# Patient Record
Sex: Male | Born: 1980 | Race: White | Hispanic: No | Smoking: Current every day smoker
Health system: Southern US, Community
[De-identification: ages and names within clinical notes are randomized; demographics above are authoritative.]

## PROBLEM LIST (undated history)

## (undated) DIAGNOSIS — B1920 Unspecified viral hepatitis C without hepatic coma: Secondary | ICD-10-CM

## (undated) DIAGNOSIS — F319 Bipolar disorder, unspecified: Secondary | ICD-10-CM

## (undated) DIAGNOSIS — F259 Schizoaffective disorder, unspecified: Secondary | ICD-10-CM

## (undated) DIAGNOSIS — F909 Attention-deficit hyperactivity disorder, unspecified type: Secondary | ICD-10-CM

---

## 2018-01-22 ENCOUNTER — Encounter (HOSPITAL_COMMUNITY): Payer: Self-pay | Admitting: *Deleted

## 2018-01-22 ENCOUNTER — Emergency Department (HOSPITAL_COMMUNITY): Payer: Self-pay

## 2018-01-22 DIAGNOSIS — Z79899 Other long term (current) drug therapy: Secondary | ICD-10-CM | POA: Insufficient documentation

## 2018-01-22 DIAGNOSIS — J069 Acute upper respiratory infection, unspecified: Secondary | ICD-10-CM | POA: Insufficient documentation

## 2018-01-22 DIAGNOSIS — R0982 Postnasal drip: Secondary | ICD-10-CM | POA: Insufficient documentation

## 2018-01-22 DIAGNOSIS — R042 Hemoptysis: Secondary | ICD-10-CM | POA: Insufficient documentation

## 2018-01-22 DIAGNOSIS — F250 Schizoaffective disorder, bipolar type: Secondary | ICD-10-CM | POA: Insufficient documentation

## 2018-01-22 DIAGNOSIS — F909 Attention-deficit hyperactivity disorder, unspecified type: Secondary | ICD-10-CM | POA: Insufficient documentation

## 2018-01-22 NOTE — ED Triage Notes (Signed)
Pt arrives [facility] where [facility] report he has been staying for psychiatric care for about 2 weeks. Staff reported that the pt "vomited blood" then got in an altercation with staff and walked away from the facility.129/72, 22R , R 110-120's en route, resp clear and equal, no distress. CBG 100   Pt reports he has been coughing and he "spit up some blood", reports he has had a lot of nasal congestion. He says "they places I have been staying, they just don't give me my psych meds like I need, I had to get out of there".

## 2018-01-23 ENCOUNTER — Other Ambulatory Visit: Payer: Self-pay

## 2018-01-23 ENCOUNTER — Emergency Department (HOSPITAL_COMMUNITY)
Admission: EM | Admit: 2018-01-23 | Discharge: 2018-01-23 | Disposition: A | Discharge status: Home / Self Care | Payer: Self-pay | Attending: Emergency Medicine | Admitting: Emergency Medicine

## 2018-01-23 DIAGNOSIS — R05 Cough: Secondary | ICD-10-CM

## 2018-01-23 DIAGNOSIS — R059 Cough, unspecified: Secondary | ICD-10-CM

## 2018-01-23 DIAGNOSIS — B9789 Other viral agents as the cause of diseases classified elsewhere: Secondary | ICD-10-CM

## 2018-01-23 DIAGNOSIS — R042 Hemoptysis: Secondary | ICD-10-CM

## 2018-01-23 DIAGNOSIS — J069 Acute upper respiratory infection, unspecified: Secondary | ICD-10-CM

## 2018-01-23 HISTORY — DX: Schizoaffective disorder, unspecified: F25.9

## 2018-01-23 HISTORY — DX: Bipolar disorder, unspecified: F31.9

## 2018-01-23 HISTORY — DX: Unspecified viral hepatitis C without hepatic coma: B19.20

## 2018-01-23 HISTORY — DX: Attention-deficit hyperactivity disorder, unspecified type: F90.9

## 2018-01-23 LAB — CBC WITH DIFFERENTIAL/PLATELET
BASOS ABS: 0 10*3/uL (ref 0.0–0.1)
BASOS PCT: 0 %
EOS PCT: 2 %
Eosinophils Absolute: 0.2 10*3/uL (ref 0.0–0.7)
HCT: 43.5 % (ref 39.0–52.0)
HEMOGLOBIN: 14.5 g/dL (ref 13.0–17.0)
LYMPHS ABS: 3.3 10*3/uL (ref 0.7–4.0)
Lymphocytes Relative: 37 %
MCH: 30.5 pg (ref 26.0–34.0)
MCHC: 33.3 g/dL (ref 30.0–36.0)
MCV: 91.6 fL (ref 78.0–100.0)
Monocytes Absolute: 0.6 10*3/uL (ref 0.1–1.0)
Monocytes Relative: 6 %
NEUTROS PCT: 55 %
Neutro Abs: 4.9 10*3/uL (ref 1.7–7.7)
PLATELETS: 210 10*3/uL (ref 150–400)
RBC: 4.75 MIL/uL (ref 4.22–5.81)
RDW: 13.4 % (ref 11.5–15.5)
WBC: 8.9 10*3/uL (ref 4.0–10.5)

## 2018-01-23 LAB — BASIC METABOLIC PANEL
ANION GAP: 10 (ref 5–15)
BUN: 16 mg/dL (ref 6–20)
CHLORIDE: 107 mmol/L (ref 98–111)
CO2: 26 mmol/L (ref 22–32)
Calcium: 9.3 mg/dL (ref 8.9–10.3)
Creatinine, Ser: 0.88 mg/dL (ref 0.61–1.24)
Glucose, Bld: 101 mg/dL — ABNORMAL HIGH (ref 70–99)
POTASSIUM: 3.6 mmol/L (ref 3.5–5.1)
Sodium: 143 mmol/L (ref 135–145)

## 2018-01-23 LAB — D-DIMER, QUANTITATIVE (NOT AT ARMC): D DIMER QUANT: 0.41 ug{FEU}/mL (ref 0.00–0.50)

## 2018-01-23 MED ORDER — ALBUTEROL SULFATE HFA 108 (90 BASE) MCG/ACT IN AERS
2.0000 | INHALATION_SPRAY | RESPIRATORY_TRACT | Status: DC | PRN
  Administered 2018-01-23: 2 via RESPIRATORY_TRACT
  Filled 2018-01-23: qty 6.7

## 2018-01-23 MED ORDER — AEROCHAMBER PLUS FLO-VU MEDIUM MISC
1.0000 | Freq: Once | Status: AC
  Administered 2018-01-23: 1
  Filled 2018-01-23: qty 1

## 2018-01-23 NOTE — Discharge Instructions (Signed)
1. Medications: OTC mucinex, Albuterol, usual home medications 2. Treatment: rest, drink plenty of fluids, take tylenol or ibuprofen for fever control 3. Follow Up: Please followup with your primary doctor in 3 days for discussion of your diagnoses and further evaluation after today's visit; if you do not have a primary care doctor use the resource guide provided to find one; Return to the ER for high fevers, difficulty breathing or other concerning symptoms

## 2018-01-23 NOTE — ED Provider Notes (Signed)
[facility]-EMERGENCY DEPT Provider Note   CSN: [id number] Arrival date & time: 01/22/18  1940     History   Chief Complaint Chief Complaint  Patient presents with  . Cough    HPI Travis Sandoval is a 37 y.o. male with a hx of ADHD, bipolar disorder, hepatitis C, schizoaffective disorder presents to the Emergency Department complaining of acute, resolved episode of mild hematemesis onset approximately 1 hour prior to arrival.  Patient reports he has been sick over the last 6 days with URI symptoms including nasal congestion, sinus pressure, postnasal drip, sore throat and cough.  He reports that tonight he had a significant coughing fit and in the midst of it the clear sputum that he produced had several small streaks of blood.  He reports no persistent bleeding.  No hematemesis.  Patient denies shortness of breath with this.  Patient reports he is not taking a blood thinner.  No specific aggravating or alleviating factors.  No treatments prior to arrival.  He denies headache, neck pain, neck stiffness, fever, chills, chest pain, shortness of breath, abdominal pain, nausea, vomiting, diarrhea, weakness, dizziness, syncope.  Denies recent travel, periods of immobilization, previous history of DVT, leg pain, leg swelling, estrogen usage.   The history is provided by the patient and medical records. No language interpreter was used.    Past Medical History:  Diagnosis Date  . ADHD   . Bipolar 1 disorder (HCC)   . Hepatitis C   . Schizoaffective disorder (HCC)     There are no active problems to display for this patient.   Home Medications    Prior to Admission medications   Medication Sig Start Date End Date Taking? Authorizing Provider  ARIPiprazole (ABILIFY MAINTENA IM) Inject 400 mg into the muscle every 28 (twenty-eight) days.   Yes [provider]  ARIPiprazole (ABILIFY) 10 MG tablet Take 10 mg by mouth daily.   Yes [provider]    atomoxetine (STRATTERA) 40 MG capsule Take 40 mg by mouth daily.   Yes [provider]  atorvastatin (LIPITOR) 20 MG tablet Take 20 mg by mouth daily.   Yes [provider]  cetirizine (ZYRTEC) 10 MG tablet Take 10 mg by mouth daily.   Yes [provider]  Dexlansoprazole (DEXILANT) 30 MG capsule Take 30 mg by mouth daily.   Yes [provider]  hydrOXYzine (VISTARIL) 50 MG capsule Take 50 mg by mouth 3 (three) times daily as needed for itching.   Yes [provider]  Melatonin 5 MG TABS Take 5 mg by mouth at bedtime as needed (sleep).   Yes [provider]  Phosphatidyl Choline 65 MG TABS Take 1 tablet by mouth daily.    Yes [provider]    Family History No family history on file.  Social History Social History   Tobacco Use  . Smoking status: Never Smoker  . Smokeless tobacco: Never Used  Substance Use Topics  . Alcohol use: Never    Frequency: Never  . Drug use: Never     Allergies   Codeine   Review of Systems Review of Systems  Constitutional: Negative for appetite change, chills, fatigue and fever.  HENT: Positive for congestion, postnasal drip, rhinorrhea, sinus pressure and sore throat. Negative for ear discharge, ear pain and mouth sores.   Eyes: Negative for visual disturbance.  Respiratory: Positive for cough. Negative for chest tightness, shortness of breath, wheezing and stridor.   Cardiovascular: Negative for chest  pain, palpitations and leg swelling.  Gastrointestinal: Negative for abdominal pain, diarrhea, nausea and vomiting.  Genitourinary: Negative for dysuria, frequency, hematuria and urgency.  Musculoskeletal: Negative for arthralgias, back pain, myalgias and neck stiffness.  Skin: Negative for rash.  Neurological: Negative for syncope, light-headedness, numbness and headaches.  Hematological: Negative for adenopathy.  Psychiatric/Behavioral: The patient is not nervous/anxious.   All  other systems reviewed and are negative.    Physical Exam Updated Vital Signs BP 138/85   Pulse (!) 114   Temp 98.6 F (37 C) (Oral)   Resp 16   Ht 5\' 9"  (1.753 m)   Wt 97.5 kg   SpO2 96%   BMI 31.75 kg/m   Physical Exam  Constitutional: He appears well-developed and well-nourished. No distress.  HENT:  Head: Normocephalic and atraumatic.  Right Ear: Tympanic membrane, external ear and ear canal normal.  Left Ear: Tympanic membrane, external ear and ear canal normal.  Nose: Mucosal edema and rhinorrhea present. No epistaxis. Right sinus exhibits no maxillary sinus tenderness and no frontal sinus tenderness. Left sinus exhibits no maxillary sinus tenderness and no frontal sinus tenderness.  Mouth/Throat: Uvula is midline and mucous membranes are normal. Mucous membranes are not pale and not cyanotic. No oropharyngeal exudate, posterior oropharyngeal edema, posterior oropharyngeal erythema or tonsillar abscesses.  Eyes: Pupils are equal, round, and reactive to light. Conjunctivae are normal.  Neck: Normal range of motion and full passive range of motion without pain.  Cardiovascular: Intact distal pulses. Tachycardia present.  Pulses:      Radial pulses are 2+ on the right side, and 2+ on the left side.  Mild tachycardia around 100 bpm  Pulmonary/Chest: Effort normal and breath sounds normal. No stridor.  Clear and equal breath sounds without focal wheezes, rhonchi, rales  Abdominal: Soft. There is no tenderness.  Musculoskeletal: Normal range of motion. He exhibits no edema.  No calf tenderness bilaterally  Lymphadenopathy:    He has no cervical adenopathy.  Neurological: He is alert.  Skin: Skin is warm and dry. No rash noted. He is not diaphoretic.  Psychiatric: He has a normal mood and affect.  Nursing note and vitals reviewed.    ED Treatments / Results  Labs (all labs ordered are listed, but only abnormal results are displayed) Labs Reviewed  BASIC METABOLIC PANEL  - Abnormal; Notable for the following components:      Result Value   Glucose, Bld 101 (*)    All other components within normal limits  CBC WITH DIFFERENTIAL/PLATELET  D-DIMER, QUANTITATIVE (NOT AT [facility])     Radiology Dg Chest 2 View  Result Date: 01/22/2018 CLINICAL DATA:  Altercation, vomited blood. EXAM: CHEST - 2 VIEW COMPARISON:  None. FINDINGS: Cardiomediastinal silhouette is normal. No pleural effusions or focal consolidations. Trachea projects midline and there is no pneumothorax. Soft tissue planes and included osseous structures are non-suspicious. IMPRESSION: Normal chest. Electronically Signed   By: Awilda Metro M.D.   On: 01/22/2018 21:28    Procedures Procedures (including critical care time)  Medications Ordered in ED Medications  albuterol (PROVENTIL HFA;VENTOLIN HFA) 108 (90 Base) MCG/ACT inhaler 2 puff (2 puffs Inhalation Given 01/23/18 0349)  AEROCHAMBER PLUS FLO-VU MEDIUM MISC 1 each (1 each Other Given 01/23/18 0349)     Initial Impression / Assessment and Plan / ED Course  I have reviewed the triage vital signs and the nursing notes.  Pertinent labs & imaging results that were available during my care of the patient were reviewed by  me and considered in my medical decision making (see chart for details).     Presents with complaint of hemoptysis.  Suspect viral bronchitis as the cause.  No large clots.  Patient without risk factors for pulmonary embolism.  No history of same.  Bilateral calves without tenderness.  No peripheral edema.  Chest x-ray is without pneumonia, pulmonary edema or pneumothorax.  I personally evaluated these images.  Labs are reassuring.  D-dimer is negative.  Patient did have tachycardia on arrival however this is improved on my initial and repeat clinical exam.  Initial exam with heart rate right at 100 and repeat exam with heart rate at 90.  Patient with some anxiety, potentially correlating with his initial tachycardia.  No  hypotension, fever or hypoxia.  Patient given albuterol to help with coughing.  Discussed reasons to return immediately to the emergency department including worsening hemoptysis, palpitations, syncope, development of chest pain or shortness of breath.  Patient states understanding and is in agreement with the plan.  BP 135/86 (BP Location: Left Arm)   Pulse 93   Temp 98.6 F (37 C) (Oral)   Resp 16   Ht 5\' 9"  (1.753 m)   Wt 97.5 kg   SpO2 100%   BMI 31.75 kg/m    Final Clinical Impressions(s) / ED Diagnoses   Final diagnoses:  Cough  Viral URI with cough  Hemoptysis    ED Discharge Orders    None       Milta Deiters 01/23/18 9811    Geoffery Lyons, MD 01/23/18 [phone]

## 2019-09-09 IMAGING — CR DG CHEST 2V
2 series · 2 of 2 positions shown · non-contrast
Comparison: None.

CLINICAL DATA: Altercation, vomited blood.

EXAM:
CHEST - 2 VIEW

[w chest pa]
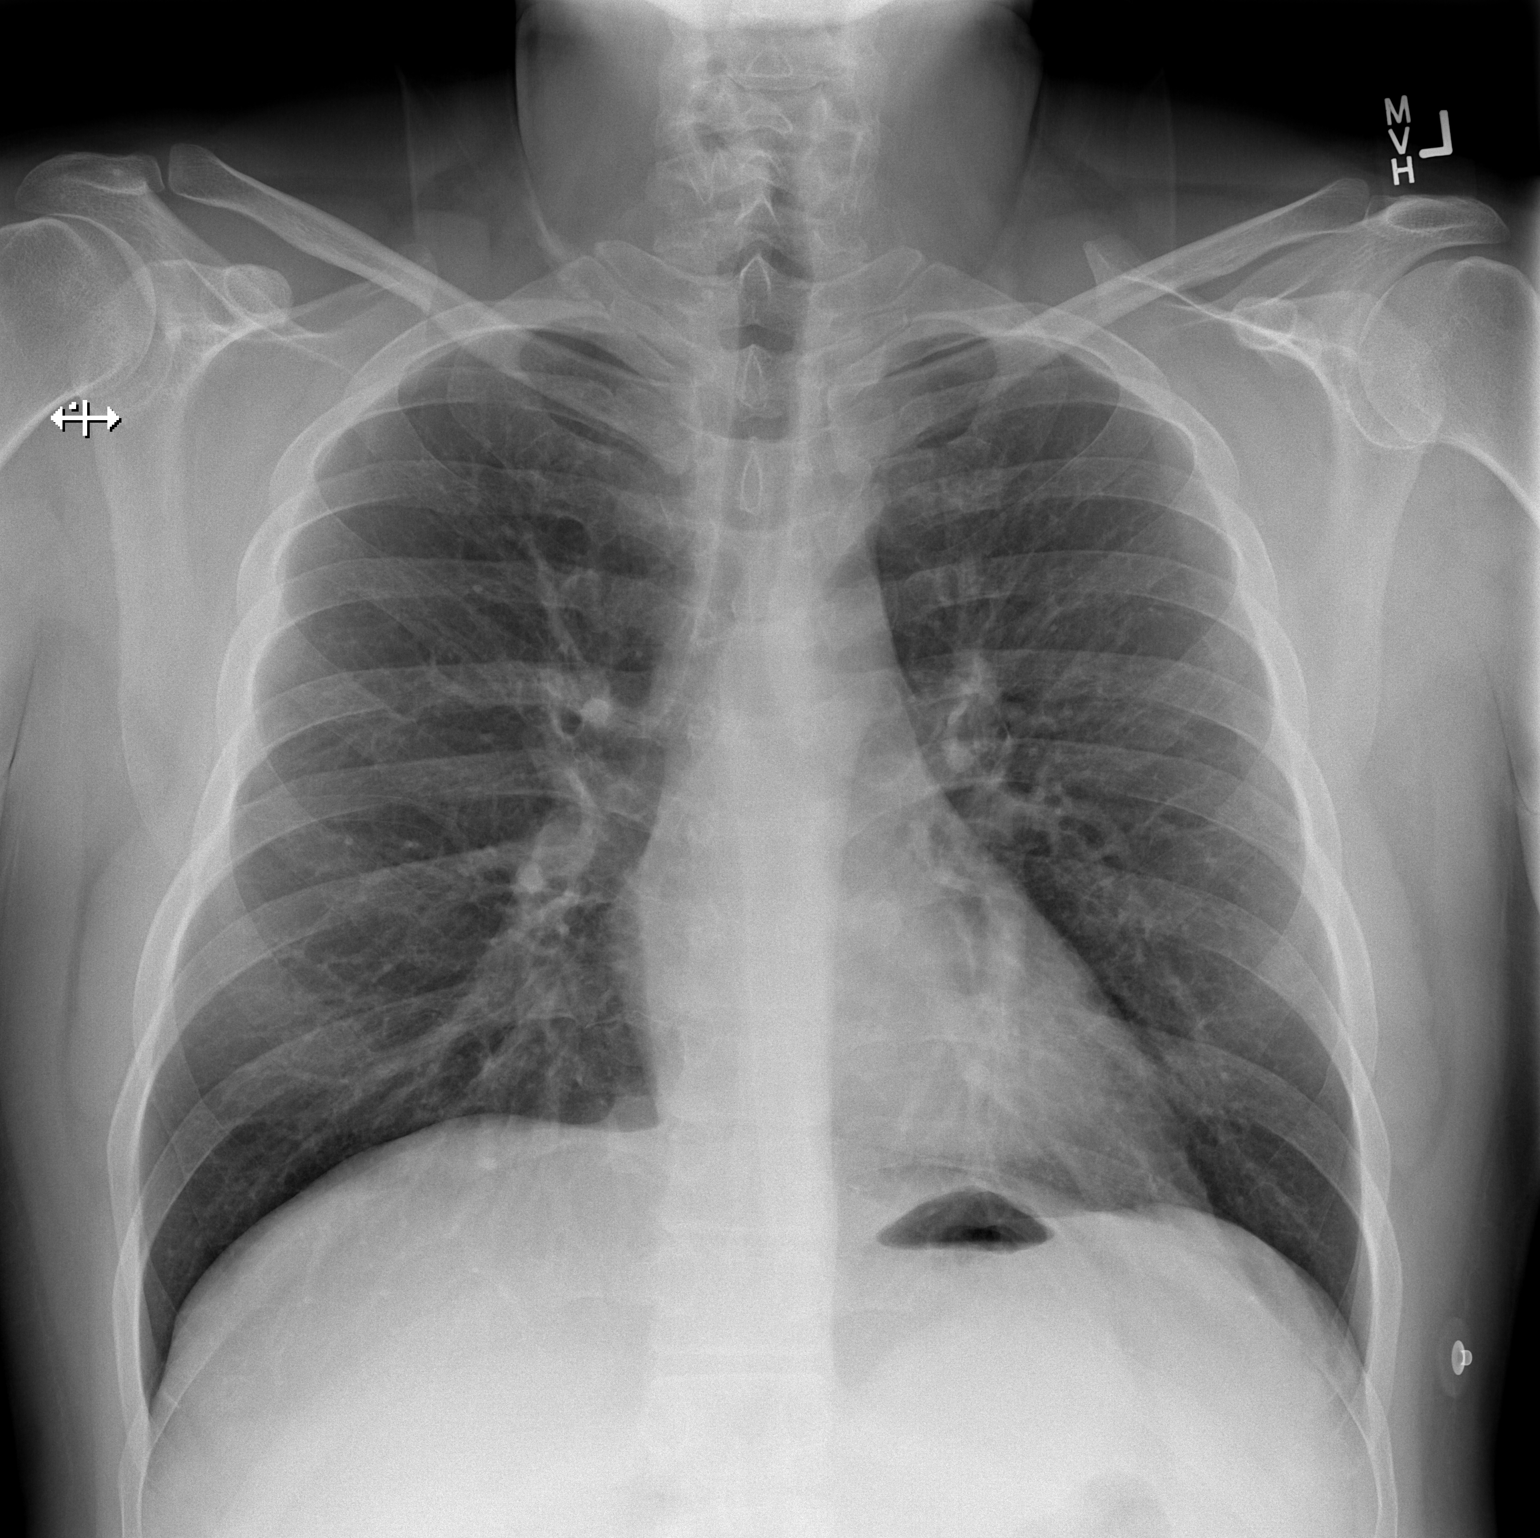

[w chest lat]
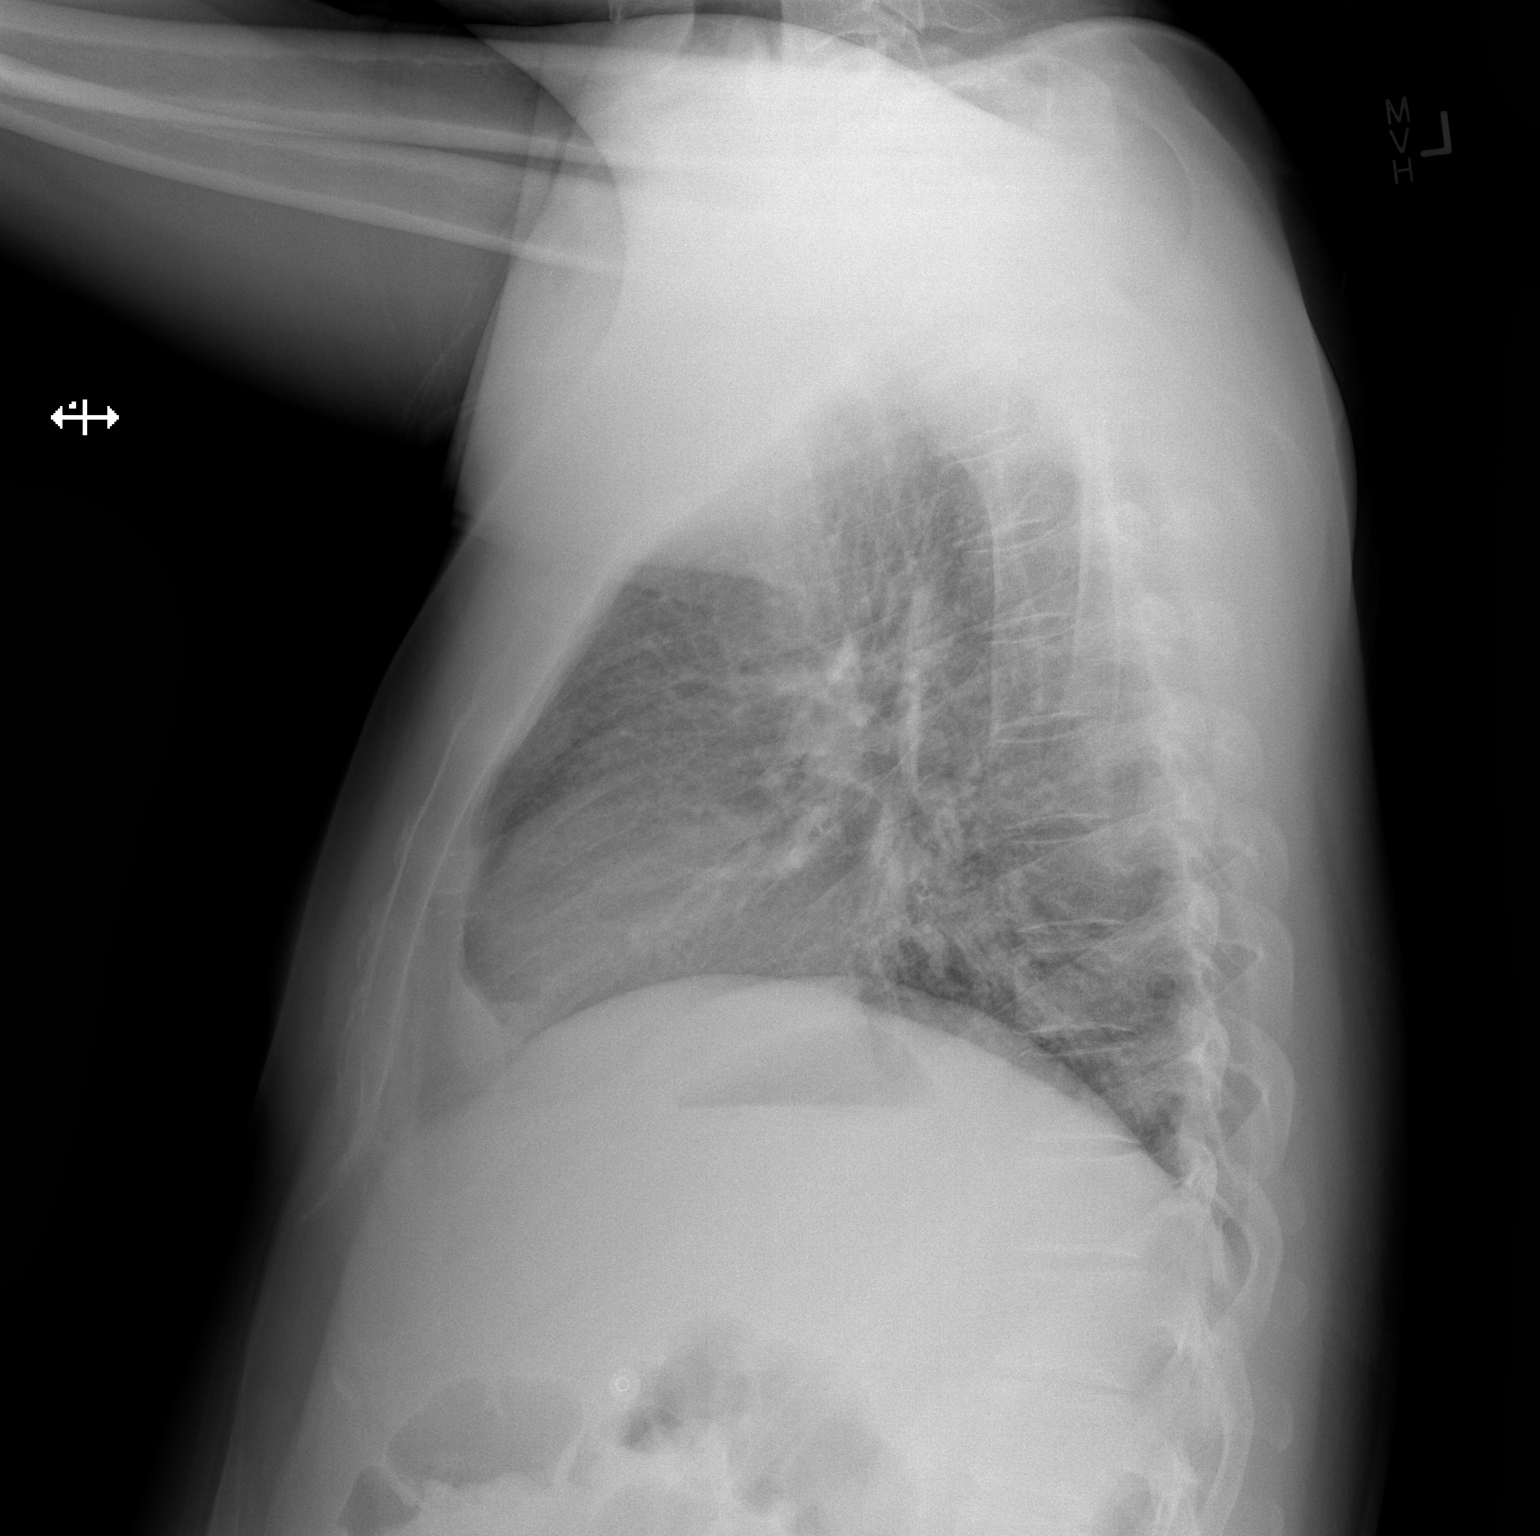

[2 of 2 positions shown; findings below may reference images not displayed]

FINDINGS: Cardiomediastinal silhouette is normal. No pleural effusions or
focal consolidations. Trachea projects midline and there is no
pneumothorax. Soft tissue planes and included osseous structures are
non-suspicious.
IMPRESSION: Normal chest.

## 2022-04-15 ENCOUNTER — Emergency Department
Admission: EM | Admit: 2022-04-15 | Discharge: 2022-04-16 | Disposition: A | Discharge status: Home / Self Care | Payer: No Typology Code available for payment source | Attending: Emergency Medicine | Admitting: Emergency Medicine

## 2022-04-15 ENCOUNTER — Other Ambulatory Visit: Payer: Self-pay

## 2022-04-15 DIAGNOSIS — F259 Schizoaffective disorder, unspecified: Secondary | ICD-10-CM | POA: Diagnosis not present

## 2022-04-15 DIAGNOSIS — Z1152 Encounter for screening for COVID-19: Secondary | ICD-10-CM | POA: Insufficient documentation

## 2022-04-15 DIAGNOSIS — F909 Attention-deficit hyperactivity disorder, unspecified type: Secondary | ICD-10-CM | POA: Insufficient documentation

## 2022-04-15 DIAGNOSIS — R000 Tachycardia, unspecified: Secondary | ICD-10-CM | POA: Diagnosis not present

## 2022-04-15 DIAGNOSIS — F29 Unspecified psychosis not due to a substance or known physiological condition: Secondary | ICD-10-CM | POA: Insufficient documentation

## 2022-04-15 DIAGNOSIS — F319 Bipolar disorder, unspecified: Secondary | ICD-10-CM | POA: Insufficient documentation

## 2022-04-15 LAB — ACETAMINOPHEN LEVEL: Acetaminophen (Tylenol), Serum: <10 ug/mL — ABNORMAL LOW (ref 10–30)

## 2022-04-15 LAB — COMPREHENSIVE METABOLIC PANEL
ALT: 41 U/L (ref 0–44)
AST: 49 U/L — ABNORMAL HIGH (ref 15–41)
Albumin: 4.5 g/dL (ref 3.5–5.0)
Alkaline Phosphatase: 67 U/L (ref 38–126)
Anion gap: 11 (ref 5–15)
BUN: 13 mg/dL (ref 6–20)
CO2: 22 mmol/L (ref 22–32)
Calcium: 8.8 mg/dL — ABNORMAL LOW (ref 8.9–10.3)
Chloride: 106 mmol/L (ref 98–111)
Creatinine, Ser: 0.9 mg/dL (ref 0.61–1.24)
GFR, Estimated: >60 mL/min (ref >60)
Glucose, Bld: 104 mg/dL — ABNORMAL HIGH (ref 70–99)
Potassium: 3.4 mmol/L — ABNORMAL LOW (ref 3.5–5.1)
Sodium: 139 mmol/L (ref 135–145)
Total Bilirubin: 1.9 mg/dL — ABNORMAL HIGH (ref 0.3–1.2)
Total Protein: 7.3 g/dL (ref 6.5–8.1)

## 2022-04-15 LAB — CBC
HCT: 47.3 % (ref 39.0–52.0)
Hemoglobin: 15.8 g/dL (ref 13.0–17.0)
MCH: 32 pg (ref 26.0–34.0)
MCHC: 33.4 g/dL (ref 30.0–36.0)
MCV: 95.7 fL (ref 80.0–100.0)
Platelets: 156 10*3/uL (ref 150–400)
RBC: 4.94 MIL/uL (ref 4.22–5.81)
RDW: 13.9 % (ref 11.5–15.5)
WBC: 10 10*3/uL (ref 4.0–10.5)
nRBC: 0 % (ref 0.0–0.2)

## 2022-04-15 LAB — ETHANOL: Alcohol, Ethyl (B): <10 mg/dL (ref <10)

## 2022-04-15 LAB — SALICYLATE LEVEL: Salicylate Lvl: <7 mg/dL — ABNORMAL LOW (ref 7.0–30.0)

## 2022-04-15 MED ORDER — LORAZEPAM 2 MG/ML IJ SOLN
2.0000 mg | Freq: Once | INTRAMUSCULAR | Status: AC
  Administered 2022-04-15: 2 mg via INTRAMUSCULAR

## 2022-04-15 MED ORDER — LORAZEPAM 2 MG/ML IJ SOLN: 2.0000 mg | Freq: Once | INTRAMUSCULAR | Status: DC

## 2022-04-15 MED ORDER — HALOPERIDOL LACTATE 5 MG/ML IJ SOLN: 5.0000 mg | Freq: Once | INTRAMUSCULAR | Status: AC

## 2022-04-15 MED ORDER — LORAZEPAM 2 MG/ML IJ SOLN
INTRAMUSCULAR | Status: AC
  Filled 2022-04-15: qty 1

## 2022-04-15 MED ORDER — HALOPERIDOL LACTATE 5 MG/ML IJ SOLN
INTRAMUSCULAR | Status: AC
  Administered 2022-04-15: 5 mg via INTRAMUSCULAR
  Filled 2022-04-15: qty 1

## 2022-04-15 NOTE — Consult Note (Signed)
[facility] Face-to-Face Psychiatry Consult   Reason for Consult:Psychiatric Evaluation (IVC)    Referring Physician: Dr. Modesto Charon Patient Identification: Travis Sandoval MRN:  [id number] Principal Diagnosis: <principal problem not specified> Diagnosis:  Active Problems:   Bipolar disorder (HCC)   Schizoaffective disorder (HCC)   Attention deficit hyperactivity disorder (ADHD)   Total Time spent with patient: 1 hour  Subjective: "I don't know why I am here."  Travis Sandoval is a 41 y.o. male patient presented to [facility] ED via law enforcement under involuntary commitment status (IVC). Per the ED triage nurse's note,  Pt coming in with [facility]. Pt has IVC orders due to hallucinations, paranoid, delustional and other unknown mental health history. Pt from a group home resident and "ran away, reported missing to [facility], located approximately 1 mile from group home and thinks the group home is trying to kill him." Pt states the police are planting evidence on him, hurting him, and assaulting him. Pt very anxious, but appears to be having delusions.  The patient was calm during his assessment but was very aggressive when he first arrived at the ED. This provider saw The patient face-to-face; the chart was reviewed, and Dr. Modesto Charon was consulted on 04/15/2022 due to the patient's care. It was discussed with the EDP that the patient does meet the criteria to be admitted to the psychiatric inpatient unit.  On evaluation, the patient is alert and oriented x 2-3, calm, cooperative, and mood-congruent with affect. The patient does not appear to be responding to internal or external stimuli. The patient is presenting with some delusional thinking. The patient denies auditory or visual hallucinations. The patient denies any suicidal, homicidal, or self-harm ideations. The patient is presenting with some psychotic and paranoid behaviors. During an encounter with the patient, he could not answer questions appropriately.    HPI: Per Dr. Modesto Charon, Travis Sandoval is a 41 y.o. male   Past medical history of bipolar, schizoaffective ADHD and hepatitis C who presents to the emergency department under [facility] IVC due to hallucinations, paranoia, delusional behavior, running away from group home and was reported missing and found approximately 1 mile from his group home telling police that he thinks the group home is trying to kill him.  He accuses the police of planting evidence on him hurting him and assaulting him appears to be very anxious on arrival.   When I assessed the patient, he has no acute complaints but he appears to be acutely psychotic, speaking nonsensically, at times and another language that I cannot discern, not easily interruptible.   Nursing called me because the patient began to act aggressively in the emergency department and not verbally redirectable so I ordered for IM sedative medications including droperidol and Ativan.  Apparently he had barricaded himself in the bathroom and was suspicious that the police were trying to plant a chip in him.    History was obtained via limited by patient due to his condition, independent historian includes police who give report as above.   Past Psychiatric History: bipolar, schizoaffective ADHD  Risk to Self:   Risk to Others:   Prior Inpatient Therapy:   Prior Outpatient Therapy:    Past Medical History:  Past Medical History:  Diagnosis Date   ADHD    Bipolar 1 disorder (HCC)    Hepatitis C    Schizoaffective disorder (HCC)     Family History: No family history on file. Family Psychiatric  History: History reviewed. No pertinent family psychiatric history Social History:  Social History   Substance and Sexual Activity  Alcohol Use Never     Social History   Substance and Sexual Activity  Drug Use Never    Social History   Socioeconomic History   Marital status: Single    Spouse name: Not on file   Number of children: Not on file    Years of education: Not on file   Highest education level: Not on file  Occupational History   Not on file  Tobacco Use   Smoking status: Never   Smokeless tobacco: Never  Substance and Sexual Activity   Alcohol use: Never   Drug use: Never   Sexual activity: Not on file  Other Topics Concern   Not on file  Social History Narrative   Not on file   Social Determinants of Health   Financial Resource Strain: Not on file  Food Insecurity: Not on file  Transportation Needs: Not on file  Physical Activity: Not on file  Stress: Not on file  Social Connections: Not on file   Additional Social History:    Allergies:   Allergies  Allergen Reactions   Codeine Other (See Comments)    Causes fever    Labs:  Results for orders placed or performed during the hospital encounter of 04/15/22 (from the past 48 hour(s))  Comprehensive metabolic panel     Status: Abnormal   Collection Time: 04/15/22  4:39 PM  Result Value Ref Range   Sodium 139 135 - 145 mmol/L   Potassium 3.4 (L) 3.5 - 5.1 mmol/L   Chloride 106 98 - 111 mmol/L   CO2 22 22 - 32 mmol/L   Glucose, Bld 104 (H) 70 - 99 mg/dL    Comment: Glucose reference range applies only to samples taken after fasting for at least 8 hours.   BUN 13 6 - 20 mg/dL   Creatinine, Ser 6.76 0.61 - 1.24 mg/dL   Calcium 8.8 (L) 8.9 - 10.3 mg/dL   Total Protein 7.3 6.5 - 8.1 g/dL   Albumin 4.5 3.5 - 5.0 g/dL   AST 49 (H) 15 - 41 U/L   ALT 41 0 - 44 U/L   Alkaline Phosphatase 67 38 - 126 U/L   Total Bilirubin 1.9 (H) 0.3 - 1.2 mg/dL   GFR, Estimated >19 >50 mL/min    Comment: (NOTE) Calculated using the CKD-EPI Creatinine Equation (2021)    Anion gap 11 5 - 15    Comment: Performed at [facility] [address] [place], [place] [address]  Ethanol     Status: None   Collection Time: 04/15/22  4:39 PM  Result Value Ref Range   Alcohol, Ethyl (B) <10 <10 mg/dL    Comment: (NOTE) Lowest detectable limit for serum alcohol is  10 mg/dL.  For medical purposes only. Performed at [facility], [address] [place], [place] [address]   cbc     Status: None   Collection Time: 04/15/22  4:39 PM  Result Value Ref Range   WBC 10.0 4.0 - 10.5 K/uL   RBC 4.94 4.22 - 5.81 MIL/uL   Hemoglobin 15.8 13.0 - 17.0 g/dL   HCT 09.9 83.3 - 82.5 %   MCV 95.7 80.0 - 100.0 fL   MCH 32.0 26.0 - 34.0 pg   MCHC 33.4 30.0 - 36.0 g/dL   RDW 05.3 97.6 - 73.4 %   Platelets 156 150 - 400 K/uL   nRBC 0.0 0.0 - 0.2 %  Comment: Performed at [facility], [address] [place], [place] [address]  Acetaminophen level     Status: Abnormal   Collection Time: 04/15/22  4:39 PM  Result Value Ref Range   Acetaminophen (Tylenol), Serum <10 (L) 10 - 30 ug/mL    Comment: (NOTE) Therapeutic concentrations vary significantly. A range of 10-30 ug/mL  may be an effective concentration for many patients. However, some  are best treated at concentrations outside of this range. Acetaminophen concentrations >150 ug/mL at 4 hours after ingestion  and >50 ug/mL at 12 hours after ingestion are often associated with  toxic reactions.  Performed at [facility], [address] [place], [place] [address]   Salicylate level     Status: Abnormal   Collection Time: 04/15/22  4:39 PM  Result Value Ref Range   Salicylate Lvl <7.0 (L) 7.0 - 30.0 mg/dL    Comment: Performed at [facility], [address] [place], [place] [address]    No current facility-administered medications for this encounter.   Current Outpatient Medications  Medication Sig Dispense Refill   ABILIFY MAINTENA 400 MG SRER injection Inject 400 mg into the muscle every 28 (twenty-eight) days.     atorvastatin (LIPITOR) 40 MG tablet Take 40 mg by mouth daily.     budesonide-formoterol (SYMBICORT) 160-4.5 MCG/ACT inhaler Inhale 2 puffs into the lungs.     clozapine (CLOZARIL) 200 MG tablet Take 400 mg by mouth at bedtime.     metoprolol  succinate (TOPROL-XL) 25 MG 24 hr tablet Take 12.5 mg by mouth daily.     valproic acid (DEPAKENE) 250 MG/5ML solution Take 250 mg by mouth.     ARIPiprazole (ABILIFY) 10 MG tablet Take 10 mg by mouth daily.     atomoxetine (STRATTERA) 40 MG capsule Take 40 mg by mouth daily.     atorvastatin (LIPITOR) 20 MG tablet Take 20 mg by mouth daily. (Patient not taking: Reported on 04/15/2022)     cetirizine (ZYRTEC) 10 MG tablet Take 10 mg by mouth daily.     Dexlansoprazole (DEXILANT) 30 MG capsule Take 30 mg by mouth daily.     hydrOXYzine (VISTARIL) 50 MG capsule Take 50 mg by mouth 3 (three) times daily as needed for itching.     Melatonin 5 MG TABS Take 5 mg by mouth at bedtime as needed (sleep).     Phosphatidyl Choline 65 MG TABS Take 1 tablet by mouth daily.       Musculoskeletal: Strength & Muscle Tone: within normal limits Gait & Station: normal Patient leans: N/A  Psychiatric Specialty Exam:  Presentation  General Appearance:  Disheveled  Eye Contact: Minimal  Speech: Pressured  Speech Volume: Normal  Handedness: Right   Mood and Affect  Mood: Euphoric; Irritable  Affect: Inappropriate   Thought Process  Thought Processes: Disorganized  Descriptions of Associations:Loose  Orientation:Partial  Thought Content:Illogical; Scattered  History of Schizophrenia/Schizoaffective disorder:No data recorded Duration of Psychotic Symptoms:No data recorded Hallucinations:Hallucinations: None  Ideas of Reference:Delusions; Paranoia  Suicidal Thoughts:Suicidal Thoughts: No  Homicidal Thoughts:Homicidal Thoughts: No   Sensorium  Memory: Immediate Poor; Recent Poor; Remote Poor  Judgment: Poor  Insight: Poor   Executive Functions  Concentration: Fair  Attention Span: Fair  Recall: Poor  Fund of Knowledge: Poor  Language: Poor   Psychomotor Activity  Psychomotor Activity: Psychomotor Activity: Normal   Assets  Assets: Desire for  Improvement; Social Support   Sleep  Sleep: Sleep: Fair Number of Hours of Sleep:  6   Physical Exam: Physical Exam Vitals and nursing note reviewed.  Constitutional:      General: He is in acute distress.     Appearance: He is obese.  HENT:     Head: Normocephalic and atraumatic.     Right Ear: External ear normal.     Left Ear: External ear normal.     Nose: Nose normal.     Mouth/Throat:     Mouth: Mucous membranes are moist.  Cardiovascular:     Rate and Rhythm: Tachycardia present.  Pulmonary:     Effort: Pulmonary effort is normal.  Musculoskeletal:        General: Normal range of motion.     Cervical back: Normal range of motion and neck supple.  Neurological:     Mental Status: He is alert and oriented to person, place, and time.  Psychiatric:        Attention and Perception: Attention and perception normal.        Mood and Affect: Mood is anxious and depressed. Affect is blunt, flat and inappropriate.        Speech: Speech is rapid and pressured.        Behavior: Behavior is agitated and withdrawn.        Thought Content: Thought content is paranoid and delusional.        Cognition and Memory: Cognition is impaired.        Judgment: Judgment is inappropriate.    Review of Systems  Psychiatric/Behavioral:  Positive for depression. The patient is nervous/anxious.   All other systems reviewed and are negative.  Blood pressure 124/85, pulse (!) 112, temperature 98.5 F (36.9 C), resp. rate 18, height 5\' 9"  (1.753 m), weight 90.7 kg, SpO2 96 %. Body mass index is 29.53 kg/m.  Treatment Plan Summary: Plan   Patient does meet the criteria for psychiatric inpatient admission  Disposition: Recommend psychiatric Inpatient admission when medically cleared. Supportive therapy provided about ongoing stressors.  Gillermo MurdochJacqueline Alixandria Friedt, NP 04/15/2022 10:57 PM

## 2022-04-15 NOTE — ED Notes (Addendum)
Pt belongings  1 white shirt 1 brown watch 1 grey shirt 1 yellow jacket 1 pair white shoes 1 pair red socks 1 brown pant 1 black belt 1 pair green underwear 1 black wallet 1 pack cigarrets  1 blue pillow  1 black and white pillow, 1 grey blanket 1 red and black blanket  1 small hygiene bag 1 pair black headphones 1 black speaker 1 lighter All pt belongings placed in a total of 3 bags

## 2022-04-15 NOTE — BH Assessment (Signed)
Comprehensive Clinical Assessment (CCA) Note  04/15/2022 Travis Sandoval [id number]  Chief Complaint:  Chief Complaint  Patient presents with   Psychiatric Evaluation    IVC   [name] report , "I need a toxic rain bath.  He reports that he works for the [place] [employer] and he has been exposed to all toxins and metal micro chips.  He denied symptoms of depression.  Reports he has a little anxiety, but that he has a lot to do.  He reports that he has papaya and it has  0.087 percent alcohol. He denied having auditory or visual hallucinations.  He reports that he has taken medications, but is unable to spell the ones that he takes, as some of them are 300 digit words. He states that he is homeless, but has a place to go, because the [place] [employer] will come and get him.   TTS made multiple attempts to contact Travis Sandoval [phone] (father). Calls were sent to voice mail.      IVC reports, "Hallucinations, paranoid, delusional, unknown mental health history. Group home resident, ran away, reported missing to [facility]. Located approximately 1 mile from group home.  Thinks group home is trying to kill him. "  Visit Diagnosis: Altered Mental Status    CCA Screening, Triage and Referral (STR)  Patient Reported Information How did you hear about [place]? No data recorded Referral name: No data recorded Referral phone number: No data recorded  Whom do you see for routine medical problems? No data recorded Practice/Facility Name: No data recorded Practice/Facility Phone Number: No data recorded Name of Contact: No data recorded Contact Number: No data recorded Contact Fax Number: No data recorded Prescriber Name: No data recorded Prescriber Address (if known): No data recorded  What Is the Reason for Your Visit/Call Today? No data recorded How Long Has This Been Causing You Problems? No data recorded What Do You Feel Would Help You the Most Today? No data recorded  Have You Recently Been in Any  Inpatient Treatment (Hospital/Detox/Crisis Center/28-Day Program)? No data recorded Name/Location of Program/Hospital:No data recorded How Long Were You There? No data recorded When Were You Discharged? No data recorded  Have You Ever Received Services From [facility] Before? No data recorded Who Do You See at [facility]? No data recorded  Have You Recently Had Any Thoughts About Hurting Yourself? No data recorded Are You Planning to Commit Suicide/Harm Yourself At This time? No data recorded  Have you Recently Had Thoughts About Hurting Someone Travis Sandoval? No data recorded Explanation: No data recorded  Have You Used Any Alcohol or Drugs in the Past 24 Hours? No data recorded How Long Ago Did You Use Drugs or Alcohol? No data recorded What Did You Use and How Much? No data recorded  Do You Currently Have a Therapist/Psychiatrist? No data recorded Name of Therapist/Psychiatrist: No data recorded  Have You Been Recently Discharged From Any Office Practice or Programs? No data recorded Explanation of Discharge From Practice/Program: No data recorded    CCA Screening Triage Referral Assessment Type of Contact: No data recorded Is this Initial or Reassessment? No data recorded Date Telepsych consult ordered in [facility]:  No data recorded Time Telepsych consult ordered in [facility]:  No data recorded  Patient Reported Information Reviewed? No data recorded Patient Left Without Being Seen? No data recorded Reason for Not Completing Assessment: No data recorded  Collateral Involvement: No data recorded  Does Patient Have a Court Appointed Legal Guardian? No data recorded Name  and Contact of Legal Guardian: No data recorded If Minor and Not Living with Parent(s), Who has Custody? No data recorded Is CPS involved or ever been involved? No data recorded Is APS involved or ever been involved? No data recorded  Patient Determined To Be At Risk for Harm To Self or Others Based on Review of Patient  Reported Information or Presenting Complaint? No data recorded Method: No data recorded Availability of Means: No data recorded Intent: No data recorded Notification Required: No data recorded Additional Information for Danger to Others Potential: No data recorded Additional Comments for Danger to Others Potential: No data recorded Are There Guns or Other Weapons in Your Home? No data recorded Types of Guns/Weapons: No data recorded Are These Weapons Safely Secured?                            No data recorded Who Could Verify You Are Able To Have These Secured: No data recorded Do You Have any Outstanding Charges, Pending Court Dates, Parole/Probation? No data recorded Contacted To Inform of Risk of Harm To Self or Others: No data recorded  Location of Assessment: No data recorded  Does Patient Present under Involuntary Commitment? No data recorded IVC Papers Initial File Date: No data recorded  [place] of Residence: No data recorded  Patient Currently Receiving the Following Services: No data recorded  Determination of Need: No data recorded  Options For Referral: No data recorded    CCA Biopsychosocial Intake/Chief Complaint:  No data recorded Current Symptoms/Problems: No data recorded  Patient Reported Schizophrenia/Schizoaffective Diagnosis in Past: No data recorded  Strengths: No data recorded Preferences: No data recorded Abilities: No data recorded  Type of Services Patient Feels are Needed: No data recorded  Initial Clinical Notes/Concerns: No data recorded  Mental Health Symptoms Depression:  No data recorded  Duration of Depressive symptoms: No data recorded  Mania:  No data recorded  Anxiety:   No data recorded  Psychosis:  No data recorded  Duration of Psychotic symptoms: No data recorded  Trauma:  No data recorded  Obsessions:  No data recorded  Compulsions:  No data recorded  Inattention:  No data recorded  Hyperactivity/Impulsivity:  No data  recorded  Oppositional/Defiant Behaviors:  No data recorded  Emotional Irregularity:  No data recorded  Other Mood/Personality Symptoms:  No data recorded   Mental Status Exam Appearance and self-care  Stature:  No data recorded  Weight:  No data recorded  Clothing:  No data recorded  Grooming:  No data recorded  Cosmetic use:  No data recorded  Posture/gait:  No data recorded  Motor activity:  No data recorded  Sensorium  Attention:  No data recorded  Concentration:  No data recorded  Orientation:  No data recorded  Recall/memory:  No data recorded  Affect and Mood  Affect:  No data recorded  Mood:  No data recorded  Relating  Eye contact:  No data recorded  Facial expression:  No data recorded  Attitude toward examiner:  No data recorded  Thought and Language  Speech flow: No data recorded  Thought content:  No data recorded  Preoccupation:  No data recorded  Hallucinations:  No data recorded  Organization:  No data recorded  [employer] of Knowledge:  No data recorded  Intelligence:  No data recorded  Abstraction:  No data recorded  Judgement:  No data recorded  Reality Testing:  No data recorded  Insight:  No data recorded  Decision Making:  No data recorded  Social Functioning  Social Maturity:  No data recorded  Social Judgement:  No data recorded  Stress  Stressors:  No data recorded  Coping Ability:  No data recorded  Skill Deficits:  No data recorded  Supports:  No data recorded    Religion:    Leisure/Recreation:    Exercise/Diet:     CCA Employment/Education Employment/Work Situation:    Education:     CCA Family/Childhood History Family and Relationship History:    Childhood History:     Child/Adolescent Assessment:     CCA Substance Use Alcohol/Drug Use:                           ASAM's:  Six Dimensions of Multidimensional Assessment  Dimension 1:  Acute Intoxication and/or Withdrawal  Potential:      Dimension 2:  Biomedical Conditions and Complications:      Dimension 3:  Emotional, Behavioral, or Cognitive Conditions and Complications:     Dimension 4:  Readiness to Change:     Dimension 5:  Relapse, Continued use, or Continued Problem Potential:     Dimension 6:  Recovery/Living Environment:     ASAM Severity Score:    ASAM Recommended Level of Treatment:     Substance use Disorder (SUD)    Recommendations for Services/Supports/Treatments:    DSM5 Diagnoses: There are no problems to display for this patient.     @BHCOLLABOFCARE @  , Counselor

## 2022-04-15 NOTE — ED Provider Notes (Signed)
[facility] Provider Note    Event Date/Time   First MD Initiated Contact with Patient 04/15/22 1656     (approximate)   History   Psychiatric Evaluation (IVC)   HPI  Travis Sandoval is a 41 y.o. male   Past medical history of bipolar, schizoaffective ADHD and hepatitis C who presents to the emergency department under [facility] IVC due to hallucinations, paranoia, delusional behavior, running away from group home and was reported missing and found approximately 1 mile from his group home telling police that he thinks the group home is trying to kill him.  He accuses the police of planting evidence on him hurting him and assaulting him appears to be very anxious on arrival.  When I assessed the patient, he has no acute complaints but he appears to be acutely psychotic, speaking nonsensically, at times and another language that I cannot discern, not easily interruptible.  Nursing called me because the patient began to act aggressively in the emergency department and not verbally redirectable so I ordered for IM sedative medications including droperidol and Ativan.  Apparently he had barricaded himself in the bathroom and was suspicious that the police were trying to plant a chip in him.   History was obtained via limited by patient due to his condition, independent historian includes police who give report as above.      Physical Exam   Triage Vital Signs: ED Triage Vitals  Enc Vitals Group     BP 04/15/22 1623 124/85     Pulse Rate 04/15/22 1623 (!) 112     Resp 04/15/22 1623 18     Temp 04/15/22 1623 98.5 F (36.9 C)     Temp src --      SpO2 04/15/22 1623 96 %     Weight 04/15/22 1619 200 lb (90.7 kg)     Height 04/15/22 1619 5\' 9"  (1.753 m)     Head Circumference --      Peak Flow --      Pain Score 04/15/22 1619 0     Pain Loc --      Pain Edu? --      Excl. in [place]? --     Most recent vital signs: Vitals:   04/15/22 1623  BP: 124/85   Pulse: (!) 112  Resp: 18  Temp: 98.5 F (36.9 C)  SpO2: 96%    General: Awake CV:  Good peripheral perfusion.  Resp:  Normal effort.  Abd:  No distention.  Other:  Speaking nonsensically, difficult to interrupt   ED Results / Procedures / Treatments   Labs (all labs ordered are listed, but only abnormal results are displayed) Labs Reviewed  COMPREHENSIVE METABOLIC PANEL - Abnormal; Notable for the following components:      Result Value   Potassium 3.4 (*)    Glucose, Bld 104 (*)    Calcium 8.8 (*)    AST 49 (*)    Total Bilirubin 1.9 (*)    All other components within normal limits  ETHANOL  CBC  URINE DRUG SCREEN, QUALITATIVE ([facility] ONLY)  ACETAMINOPHEN LEVEL  SALICYLATE LEVEL     I reviewed labs and they are notable for normal white blood cell count    PROCEDURES:  Critical Care performed: No  Procedures   MEDICATIONS ORDERED IN ED: Medications  haloperidol lactate (HALDOL) injection 5 mg (5 mg Intramuscular Given 04/15/22 1655)  LORazepam (ATIVAN) injection 2 mg (2 mg Intramuscular Given 04/15/22 1655)  IMPRESSION / MDM / ASSESSMENT AND PLAN / ED COURSE  I reviewed the triage vital signs and the nursing notes.                              Differential diagnosis includes, but is not limited to, decompensated psychiatric illness, toxidrome/intoxicated  MDM: Patient with history of psychiatric illness who appears acutely psychotic and paranoid under police IVC, will obtain toxicologic and basic labs, sedated as above, have psychiatric evaluation.   Patient's presentation is most consistent with acute presentation with potential threat to life or bodily function.       FINAL CLINICAL IMPRESSION(S) / ED DIAGNOSES   Final diagnoses:  Psychosis, unspecified psychosis type (HCC)     Rx / DC Orders   ED Discharge Orders     None        Note:  This document was prepared using Dragon voice recognition software and may include  unintentional dictation errors.    Pilar Jarvis, MD 04/15/22 1710

## 2022-04-15 NOTE — ED Triage Notes (Signed)
Pt coming in with Travis Sandoval. Pt has IVC orders due to hallucinations, paranoid, delustional and other unknown mental health history. Pt from a group home resident and "ran away, reported missing to [facility], located approximately 1 mile from group home and thinks the group home is trying to kill him."  Pt states the police are planting evidence on him, hurting him, and assaulting him. Pt very anxious, but appears to be having delusions.

## 2022-04-15 NOTE — ED Notes (Signed)
Patient immediately ran into the bathroom in the quad area when [employer] officers tried to place him in room 20. Patient then would not leave the bathroom and was yelling and cussing at the officers, stating they were trying to plant a chip in him and that one of them was a clone that took all his cards and replaced them with fake ones. Patient continued to refuse to come out of the bathroom and was yelling and cussing at the officers. Patient stated he could not go to room 20, that the garage door for equipment was a door that someone could come in the back through and get him. When assured the door would not open without a key that I had, patient was agreeable to go to room. Patient moved to room 20. Once in room, patient continued to pace, was shaky, and stated he worked for the [place] [employer] and had just come back from a mission. When asked if he felt if he needed something for feeling anxious, patient reported he needed a medication that was a made up name.

## 2022-04-16 ENCOUNTER — Encounter: Payer: Self-pay | Admitting: Psychiatry

## 2022-04-16 ENCOUNTER — Inpatient Hospital Stay
Admission: AD | Admit: 2022-04-16 | Discharge: 2022-09-25 | DRG: 885 | Disposition: A | Discharge status: Home / Self Care | Payer: No Typology Code available for payment source | Source: Intra-hospital | Attending: Psychiatry | Admitting: Psychiatry

## 2022-04-16 DIAGNOSIS — Z5986 Financial insecurity: Secondary | ICD-10-CM

## 2022-04-16 DIAGNOSIS — F209 Schizophrenia, unspecified: Principal | ICD-10-CM | POA: Insufficient documentation

## 2022-04-16 DIAGNOSIS — F419 Anxiety disorder, unspecified: Secondary | ICD-10-CM | POA: Diagnosis present

## 2022-04-16 DIAGNOSIS — R059 Cough, unspecified: Secondary | ICD-10-CM | POA: Diagnosis not present

## 2022-04-16 DIAGNOSIS — Z5900 Homelessness unspecified: Secondary | ICD-10-CM

## 2022-04-16 DIAGNOSIS — F909 Attention-deficit hyperactivity disorder, unspecified type: Secondary | ICD-10-CM | POA: Diagnosis present

## 2022-04-16 DIAGNOSIS — F200 Paranoid schizophrenia: Secondary | ICD-10-CM | POA: Diagnosis not present

## 2022-04-16 DIAGNOSIS — Z91148 Patient's other noncompliance with medication regimen for other reason: Secondary | ICD-10-CM

## 2022-04-16 DIAGNOSIS — B1920 Unspecified viral hepatitis C without hepatic coma: Secondary | ICD-10-CM | POA: Diagnosis present

## 2022-04-16 DIAGNOSIS — F29 Unspecified psychosis not due to a substance or known physiological condition: Secondary | ICD-10-CM | POA: Diagnosis present

## 2022-04-16 DIAGNOSIS — F17210 Nicotine dependence, cigarettes, uncomplicated: Secondary | ICD-10-CM | POA: Diagnosis present

## 2022-04-16 DIAGNOSIS — Z79899 Other long term (current) drug therapy: Secondary | ICD-10-CM

## 2022-04-16 DIAGNOSIS — F319 Bipolar disorder, unspecified: Secondary | ICD-10-CM | POA: Diagnosis present

## 2022-04-16 DIAGNOSIS — Z1152 Encounter for screening for COVID-19: Secondary | ICD-10-CM | POA: Diagnosis not present

## 2022-04-16 DIAGNOSIS — F203 Undifferentiated schizophrenia: Principal | ICD-10-CM | POA: Diagnosis present

## 2022-04-16 DIAGNOSIS — Z91199 Patient's noncompliance with other medical treatment and regimen due to unspecified reason: Secondary | ICD-10-CM

## 2022-04-16 DIAGNOSIS — R451 Restlessness and agitation: Secondary | ICD-10-CM | POA: Diagnosis present

## 2022-04-16 DIAGNOSIS — K5900 Constipation, unspecified: Secondary | ICD-10-CM | POA: Diagnosis not present

## 2022-04-16 LAB — RESP PANEL BY RT-PCR (RSV, FLU A&B, COVID)  RVPGX2
Influenza A by PCR: NEGATIVE
Influenza B by PCR: NEGATIVE
Resp Syncytial Virus by PCR: NEGATIVE
SARS Coronavirus 2 by RT PCR: NEGATIVE

## 2022-04-16 MED ORDER — ALUM & MAG HYDROXIDE-SIMETH 200-200-20 MG/5ML PO SUSP
30.0000 mL | ORAL | Status: DC | PRN
  Administered 2022-05-05 – 2022-09-24 (×49): 30 mL via ORAL
  Filled 2022-04-16 (×55): qty 30

## 2022-04-16 MED ORDER — LORAZEPAM 1 MG PO TABS: 1.0000 mg | ORAL_TABLET | ORAL | Status: DC | PRN

## 2022-04-16 MED ORDER — LORAZEPAM 1 MG PO TABS
1.0000 mg | ORAL_TABLET | ORAL | Status: DC | PRN
  Filled 2022-04-16: qty 1

## 2022-04-16 MED ORDER — ZIPRASIDONE MESYLATE 20 MG IM SOLR: 20.0000 mg | Freq: Two times a day (BID) | INTRAMUSCULAR | Status: DC | PRN

## 2022-04-16 MED ORDER — ACETAMINOPHEN 325 MG PO TABS
650.0000 mg | ORAL_TABLET | Freq: Four times a day (QID) | ORAL | Status: DC | PRN
  Administered 2022-08-02 – 2022-08-28 (×12): 650 mg via ORAL
  Filled 2022-04-16 (×12): qty 2

## 2022-04-16 MED ORDER — MAGNESIUM HYDROXIDE 400 MG/5ML PO SUSP
30.0000 mL | Freq: Every day | ORAL | Status: DC | PRN
  Administered 2022-05-25 – 2022-06-08 (×5): 30 mL via ORAL
  Filled 2022-04-16 (×5): qty 30

## 2022-04-16 NOTE — ED Notes (Signed)
Hope RN in [employer] received report on pt from previous Hydrologist. Called [facility] to confirm pt to go to room 325 at 1400. [facility] staff confirmed.

## 2022-04-16 NOTE — Progress Notes (Signed)
**  41 yo male admitted to [facility], IVC for psychosis. He is delusional with tangential disorganized speech. A full body search was performed with unremarkable scars, markings or tattos.  He denies ETOH and substance use but endorses smoking approx 5 cigarettes a day. He lives in a group home where he ran away and was IVC'ed to [facility]. He denies physical/mental/sexual abuse. He states he has no support systems. He denies pain or  physical problems. He denies HI/SI/AVH at present. Patient was oriented to hir room and the unit. No questions or concerns at present.

## 2022-04-16 NOTE — Plan of Care (Signed)
Patient is alert and cooperative but guarded on assessment.  Patient has no HS medications and has not requested any prn medications.  Patient denies anxiety, depression, SI/HI and AVH.  Patient has isolated to room this shift.  Patient denies pain.  Patient can contract for safety.   Q 15 minute rounds in progress, will continue to monitor. Problem: Education: Goal: Knowledge of General Education information will improve Description: Including pain rating scale, medication(s)/side effects and non-pharmacologic comfort measures Outcome: Progressing   Problem: Nutrition: Goal: Adequate nutrition will be maintained Outcome: Progressing   Problem: Coping: Goal: Level of anxiety will decrease Outcome: Progressing   Problem: Safety: Goal: Ability to remain free from injury will improve Outcome: Progressing   Problem: Health Behavior/Discharge Planning: Goal: Ability to manage health-related needs will improve Outcome: Not Progressing   Problem: Activity: Goal: Risk for activity intolerance will decrease Outcome: Not Progressing   Problem: Education: Goal: Emotional status will improve Outcome: Not Progressing   Problem: Activity: Goal: Interest or engagement in activities will improve Outcome: Not Progressing

## 2022-04-16 NOTE — [facility] Group Notes (Signed)
[facility] Group Notes:  (Nursing/MHT/Case Management/Adjunct)  Date:  04/16/2022  Time:  8:46 PM  Type of Therapy:   Wrap up  Participation Level:  Did Not Attend   Travis Sandoval 04/16/2022, 8:46 PM

## 2022-04-16 NOTE — ED Notes (Signed)
IVC/pending psych inpatient admission when medically cleared 

## 2022-04-16 NOTE — ED Provider Notes (Signed)
Emergency Medicine Observation Re-evaluation Note  Isaia Hassell is a 41 y.o. male, seen on rounds today.  Pt initially presented to the ED for complaints of Psychiatric Evaluation (IVC) Currently, the patient is resting, voices no medical complaints.  Physical Exam  BP 124/85   Pulse (!) 112   Temp 98.5 F (36.9 C)   Resp 18   Ht 5\' 9"  (1.753 m)   Wt 90.7 kg   SpO2 96%   BMI 29.53 kg/m  Physical Exam General: Resting in no acute distress Cardiac: No cyanosis Lungs: Equal rise and fall Psych: Not agitated  ED Course / MDM  EKG:   I have reviewed the labs performed to date as well as medications administered while in observation.  Recent changes in the last 24 hours include no events overnight.  Plan  Current plan is for psychiatric disposition.    , MD 04/16/22 [phone]

## 2022-04-16 NOTE — ED Notes (Signed)
Registration notified pt to go to room 325 in [facility]U at 1400.

## 2022-04-17 DIAGNOSIS — F209 Schizophrenia, unspecified: Secondary | ICD-10-CM

## 2022-04-17 LAB — URINE DRUG SCREEN, QUALITATIVE (ARMC ONLY)
Amphetamines, Ur Screen: NOT DETECTED
Barbiturates, Ur Screen: NOT DETECTED
Benzodiazepine, Ur Scrn: NOT DETECTED
Cannabinoid 50 Ng, Ur ~~LOC~~: NOT DETECTED
Cocaine Metabolite,Ur ~~LOC~~: NOT DETECTED
MDMA (Ecstasy)Ur Screen: NOT DETECTED
Methadone Scn, Ur: NOT DETECTED
Opiate, Ur Screen: NOT DETECTED
Phencyclidine (PCP) Ur S: NOT DETECTED
Tricyclic, Ur Screen: NOT DETECTED

## 2022-04-17 LAB — CBC WITH DIFFERENTIAL/PLATELET
Abs Immature Granulocytes: 0.01 10*3/uL (ref 0.00–0.07)
Basophils Absolute: 0 10*3/uL (ref 0.0–0.1)
Basophils Relative: 1 %
Eosinophils Absolute: 0.1 10*3/uL (ref 0.0–0.5)
Eosinophils Relative: 1 %
HCT: 41.1 % (ref 39.0–52.0)
Hemoglobin: 13.9 g/dL (ref 13.0–17.0)
Immature Granulocytes: 0 %
Lymphocytes Relative: 46 %
Lymphs Abs: 2.8 10*3/uL (ref 0.7–4.0)
MCH: 32 pg (ref 26.0–34.0)
MCHC: 33.8 g/dL (ref 30.0–36.0)
MCV: 94.5 fL (ref 80.0–100.0)
Monocytes Absolute: 0.4 10*3/uL (ref 0.1–1.0)
Monocytes Relative: 6 %
Neutro Abs: 2.8 10*3/uL (ref 1.7–7.7)
Neutrophils Relative %: 46 %
Platelets: 119 10*3/uL — ABNORMAL LOW (ref 150–400)
RBC: 4.35 MIL/uL (ref 4.22–5.81)
RDW: 13.7 % (ref 11.5–15.5)
WBC: 6.2 10*3/uL (ref 4.0–10.5)
nRBC: 0 % (ref 0.0–0.2)

## 2022-04-17 MED ORDER — OLANZAPINE 10 MG PO TBDP: 10.0000 mg | ORAL_TABLET | Freq: Three times a day (TID) | ORAL | Status: DC | PRN

## 2022-04-17 MED ORDER — CLOZAPINE 100 MG PO TABS
100.0000 mg | ORAL_TABLET | Freq: Every day | ORAL | Status: DC
  Administered 2022-04-17: 100 mg via ORAL
  Filled 2022-04-17 (×2): qty 1

## 2022-04-17 MED ORDER — POLYETHYLENE GLYCOL 3350 17 G PO PACK
17.0000 g | PACK | Freq: Every day | ORAL | Status: DC
  Filled 2022-04-17 (×2): qty 1

## 2022-04-17 MED ORDER — LORAZEPAM 2 MG PO TABS
2.0000 mg | ORAL_TABLET | ORAL | Status: AC | PRN
  Administered 2022-05-11: 2 mg via ORAL
  Filled 2022-04-17: qty 1

## 2022-04-17 MED ORDER — VALPROIC ACID 250 MG/5ML PO SOLN
500.0000 mg | Freq: Two times a day (BID) | ORAL | Status: DC
  Administered 2022-04-24 – 2022-04-28 (×8): 500 mg via ORAL
  Filled 2022-04-17 (×23): qty 10

## 2022-04-17 MED ORDER — ZIPRASIDONE MESYLATE 20 MG IM SOLR: 20.0000 mg | Freq: Two times a day (BID) | INTRAMUSCULAR | Status: DC | PRN

## 2022-04-17 NOTE — Progress Notes (Signed)
Recreation Therapy Notes      Date: 04/17/2022   Time: 10:25 am   Location: Craft room      Behavioral response: N/A   Intervention Topic: Values    Discussion/Intervention: Patient refused to attend group.    Clinical Observations/Feedback:  Patient refused to attend group.    Darryle Dennie LRT/CTRS        Shareena Nusz 04/17/2022 11:15 AM

## 2022-04-17 NOTE — H&P (Addendum)
Psychiatric Admission Assessment Adult  Patient Identification: Travis Sandoval MRN:  [id number] Date of Evaluation:  04/17/2022 Chief Complaint:  Psychosis [facility]) [[id number]] Principal Diagnosis: Psychosis (HCC) Diagnosis:  Schizoaffective disorder, bipolar type per history ADHD, per history Medication noncompliance History of Hepatitis C  History of Present Illness:  Travis Sandoval is a 41 year old male with a history of schizoaffective disorder, ADHD and hepatitis C who was brought to [facility] due to manic decompensation after running away from his group home and then found 1 mile from there.  He had indicated that Travis group home was trying to kill him.    Today he indicates that they were "making Eb[place]" which scared him.  States that he needs a "toxic grain bath" in order to rid himself of Travis toxins.  At this time he demands an intramuscular blood sample to verify his DNA and identity.  Says that he last had this done 20,000 years ago when he went by a different alias.  He indicates that he was admitted to [facility] for a year and a half and then was released on a top secret mission to Travis group home--he believes it is called [facility] but is not sure.  He denies having any mental health issues.  Denies any stressors in life.  Denies symptoms psychosis mania as well as suicidal or homicidal thoughts.  Shares that he has 552 kids in this hospital and has many wives.  Indicates that he is not spoken to his family in years, and comments that they are all "immortal."  Denies taking any medications prior to his admission.  He is calm and cooperative at this time but he received IM droperidol and Ativan in [facility] due to aggression, and apparently had barricaded himself in Travis bathroom, suspicious that police were trying to plant a chip in him.  His urine drug screen is negative.  I was able to speak to his father who is currently in Travis process of  obtaining guardianship.  He is feeling out Travis application himself as he does not have funds to hire a Clinical research associate.  States that patient had been at [facility] since July 2022 to October 2023 and then admitted to a group home on [address] in [place], [place]--they do not recall Travis name.  His case manager through Travis [facility] based CST team, Travis Sandoval was also on Travis phone call as well.  Patient had been doing well at [facility] group home until he began thinking that [facility] group home was poisoning him and so discontinued all of his medications 3 days ago and went missing for that period of time and eventually found behind a dumpster.  He had been on 225 mg of clozapine and Depakene 20 mL daily.  He has a 30-year history of mental health issues.Travis Sandoval' number is [phone].   Past Psychiatric History: Reports a history of numerous psychiatric admissions but reports that they were "all fake."  Denies suicidal ideations.  Does not know what medication trials he is been on at this time.  Unknown psychiatric providers at this time.  Is Travis patient at risk to self? Yes.    Has Travis patient been a risk to self in Travis past 6 months? Yes.    Has Travis patient been a risk to self within Travis distant past? Yes.    Is Travis patient a risk to others? Yes.    Has Travis patient been a risk to others in Travis past  6 months? Yes.    Has Travis patient been a risk to others within Travis distant past? Yes.     [place] Scale:  Flowsheet Row Admission (Current) from 04/16/2022 in [facility] from 04/15/2022 in [facility]  C-SSRS RISK CATEGORY No Risk No Risk         Alcohol Screening: Patient refused Alcohol Screening Tool: Yes 1. How often do you have a drink containing alcohol?: Never 2. How many drinks containing alcohol do you have on a typical day when you are drinking?: 1 or 2 3. How often do you have six or more drinks on one occasion?:  Never AUDIT-C Score: 0 4. How often during Travis last year have you found that you were not able to stop drinking once you had started?: Never 5. How often during Travis last year have you failed to do what was normally expected from you because of drinking?: Never 6. How often during Travis last year have you needed a first drink in Travis morning to get yourself going after a heavy drinking session?: Never 7. How often during Travis last year have you had a feeling of guilt of remorse after drinking?: Never 8. How often during Travis last year have you been unable to remember what happened Travis night before because you had been drinking?: Never 9. Have you or someone else been injured as a result of your drinking?: No 10. Has a relative or friend or a doctor or another health worker been concerned about your drinking or suggested you cut down?: No Alcohol Use Disorder Identification Test Final Score (AUDIT): 0 Alcohol Brief Interventions/Follow-up: Patient Refused Substance Abuse History in Travis last 12 months:  Yes.   Consequences of Substance Abuse: Negative Previous Psychotropic Medications: Yes  Psychological Evaluations: Yes  Past Medical History:  Past Medical History:  Diagnosis Date   ADHD    Bipolar 1 disorder (HCC)    Hepatitis C    Schizoaffective disorder (HCC)    History reviewed. No pertinent surgical history. Family History:  Family History  Family history unknown: Yes   Family Psychiatric  History: Unknown Tobacco Screening:  Social History   Tobacco Use  Smoking Status Every Day   Packs/day: 0.25   Years: 15.00   Total pack years: 3.75   Types: Cigarettes  Smokeless Tobacco Not on file    BH Tobacco Counseling     Are you interested in Tobacco Cessation Medications?  No, patient refused Counseled patient on smoking cessation:  Refused/Declined practical counseling Reason Tobacco Screening Not Completed: Patient Refused Screening       Social History:  Social History    Substance and Sexual Activity  Alcohol Use Not Currently     Social History   Substance and Sexual Activity  Drug Use Not Currently    Additional Social History:                           Allergies:   Allergies  Allergen Reactions   Codeine Other (See Comments)    Causes fever   Lab Results:  Results for orders placed or performed during Travis hospital encounter of 04/16/22 (from Travis past 48 hour(s))  Urine Drug Screen, Qualitative     Status: None   Collection Time: 04/16/22 11:28 PM  Result Value Ref Range   Tricyclic, Ur Screen NONE DETECTED NONE DETECTED   Amphetamines, Ur Screen NONE DETECTED NONE DETECTED  MDMA (Ecstasy)Ur Screen NONE DETECTED NONE DETECTED   Cocaine Metabolite,Ur [name] NONE DETECTED NONE DETECTED   Opiate, Ur Screen NONE DETECTED NONE DETECTED   Phencyclidine (PCP) Ur S NONE DETECTED NONE DETECTED   Cannabinoid 50 Ng, Ur [place] NONE DETECTED NONE DETECTED   Barbiturates, Ur Screen NONE DETECTED NONE DETECTED   Benzodiazepine, Ur Scrn NONE DETECTED NONE DETECTED   Methadone Scn, Ur NONE DETECTED NONE DETECTED    Comment: (NOTE) Tricyclics + metabolites, urine    Cutoff 1000 ng/mL Amphetamines + metabolites, urine  Cutoff 1000 ng/mL MDMA (Ecstasy), urine              Cutoff 500 ng/mL Cocaine Metabolite, urine          Cutoff 300 ng/mL Opiate + metabolites, urine        Cutoff 300 ng/mL Phencyclidine (PCP), urine         Cutoff 25 ng/mL Cannabinoid, urine                 Cutoff 50 ng/mL Barbiturates + metabolites, urine  Cutoff 200 ng/mL Benzodiazepine, urine              Cutoff 200 ng/mL Methadone, urine                   Cutoff 300 ng/mL  Travis urine drug screen provides only a preliminary, unconfirmed analytical test result and should not be used for non-medical purposes. Clinical consideration and professional judgment should be applied to any positive drug screen result due to possible interfering substances. A more specific alternate  chemical method must be used in order to obtain a confirmed analytical result. Gas chromatography / mass spectrometry (GC/MS) is Travis preferred confirm atory method. Performed at [facility], [address] [place], [place] [address]     Blood Alcohol level:  Lab Results  Component Value Date   [facility] <10 04/15/2022    Metabolic Disorder Labs:  No results found for: "HGBA1C", "MPG" No results found for: "PROLACTIN" No results found for: "CHOL", "TRIG", "HDL", "CHOLHDL", "VLDL", "LDLCALC"  Current Medications: Current Facility-Administered Medications  Medication Dose Route Frequency Provider Last Rate Last Admin   acetaminophen (TYLENOL) tablet 650 mg  650 mg Oral Q6H PRN Vanetta Mulders, NP       alum & mag hydroxide-simeth (MAALOX/MYLANTA) 200-200-20 MG/5ML suspension 30 mL  30 mL Oral Q4H PRN Vanetta Mulders, NP       ziprasidone (GEODON) injection 20 mg  20 mg Intramuscular Q12H PRN Reggie Pile, MD       And   LORazepam (ATIVAN) tablet 2 mg  2 mg Oral PRN Reggie Pile, MD       magnesium hydroxide (MILK OF MAGNESIA) suspension 30 mL  30 mL Oral Daily PRN Vanetta Mulders, NP       PTA Medications: Medications Prior to Admission  Medication Sig Dispense Refill Last Dose   ABILIFY MAINTENA 400 MG SRER injection Inject 400 mg into Travis muscle every 28 (twenty-eight) days.      ARIPiprazole (ABILIFY) 10 MG tablet Take 10 mg by mouth daily. (Patient not taking: Reported on 04/16/2022)      atomoxetine (STRATTERA) 40 MG capsule Take 40 mg by mouth daily. (Patient not taking: Reported on 04/16/2022)      atorvastatin (LIPITOR) 40 MG tablet Take 40 mg by mouth daily.      budesonide-formoterol (SYMBICORT) 160-4.5 MCG/ACT inhaler Inhale 2 puffs into Travis lungs.  cetirizine (ZYRTEC) 10 MG tablet Take 10 mg by mouth daily.      clozapine (CLOZARIL) 200 MG tablet Take 200 mg by mouth 2 (two) times daily. Take along with one 25 mg tablet for total 225 mg twice daily       cloZAPine (CLOZARIL) 25 MG tablet Take 25 mg by mouth 2 (two) times daily. Take along with one 200 mg tablet for total 225 mg twice daily      Dexlansoprazole (DEXILANT) 30 MG capsule Take 30 mg by mouth daily. (Patient not taking: Reported on 04/16/2022)      hydrOXYzine (VISTARIL) 50 MG capsule Take 50 mg by mouth 3 (three) times daily as needed for itching. (Patient not taking: Reported on 04/16/2022)      Melatonin 5 MG TABS Take 5 mg by mouth at bedtime as needed (sleep).      metoprolol succinate (TOPROL-XL) 25 MG 24 hr tablet Take 12.5 mg by mouth daily.      Phosphatidyl Choline 65 MG TABS Take 1 tablet by mouth daily.  (Patient not taking: Reported on 04/16/2022)      valproic acid (DEPAKENE) 250 MG/5ML solution Take 750-1,000 mLs by mouth 2 (two) times daily. 1000 mg (20 mL) every morning and 750 mg (15 mL) daily at bedtime       Musculoskeletal: Strength & Muscle Tone: within normal limits Gait & Station: normal Patient leans: Right            Psychiatric Specialty Exam:  Presentation  General Appearance:  Disheveled  Eye Contact: Minimal  Speech: Pressured  Speech Volume: Normal  Handedness: Right   Mood and Affect  Mood:   Affect: Inappropriate   Thought Process  Thought Processes: Disorganized  Duration of Psychotic Symptoms:N/A Past Diagnosis of Schizophrenia or Psychoactive disorder: No data recorded Descriptions of Associations:Loose  Orientation:Partial  Thought Content:Illogical; Scattered  Hallucinations:No data recorded Ideas of Reference:Delusions; Paranoia  Suicidal Thoughts:No data recorded Homicidal Thoughts:No data recorded  Sensorium  Memory: Immediate Poor; Recent Poor; Remote Poor  Judgment: Poor  Insight: Poor   Executive Functions  Concentration: Fair  Attention Span: Fair  Recall: Poor  Fund of Knowledge: Poor  Language: Poor   Psychomotor Activity  Psychomotor Activity:No data  recorded  Assets  Assets: Desire for Improvement; Social Support   Sleep  Sleep:No data recorded   Physical Exam:  Physical Exam: Physical Exam Vitals and nursing note reviewed.  Constitutional:      Appearance: Normal appearance.  HENT:     Head: Normocephalic and atraumatic.     Mouth/Throat:     Pharynx: Oropharynx is clear.  Eyes:     Pupils: Pupils are equal, round, and reactive to light.  Cardiovascular:     Rate and Rhythm: Normal rate and regular rhythm.  Pulmonary:     Effort: Pulmonary effort is normal.     Breath sounds: Normal breath sounds.  Abdominal:     General: Abdomen is flat.     Palpations: Abdomen is soft.  Musculoskeletal:        General: Normal range of motion.  Skin:    General: Skin is warm and dry.  Neurological:     General: No focal deficit present.     Mental Status: She is alert. Mental status is at baseline.  Psychiatric:        Attention and Perception: Attention normal.        Mood and Affect: Mood normal.        Speech:  Speech normal.        Behavior: Behavior normal.        Thought Content: Thought content normal.        Cognition and Memory: Cognition normal.        Judgment: Judgment normal.   Blood pressure 114/74, pulse (!) 54, temperature 98 F (36.7 C), temperature source Oral, resp. rate 16, height 5\' 9"  (1.753 m), weight 96.4 kg, SpO2 94 %. Body mass index is 31.38 kg/m.  Treatment Plan Summary: Daily contact with patient to assess and evaluate symptoms and progress in treatment and Medication management  Observation Level/Precautions:  Continuous Observation  Laboratory:    Psychotherapy:    Medications:    Consultations:    Discharge Concerns:    Estimated LOS:  Other:     Physician Treatment Plan for Primary Diagnosis: Psychosis (HCC) Long Term Goal(s): n/a Short Term Goals:  None, pt is a poor hx.  Physician Treatment Plan for Secondary Diagnosis: Principal Problem:   Psychosis [facility])  Short Term  Goals: None, pt is a poor hx.   12/27 Called pt's father at [phone] for collateral. Pt does not have capacity and so information is needed urgently for stabilization. Labs and tests reviewed Patient recently on a total of Clozaril 225 mg Will start Clozaril 100 mg p.o. daily for now with likely increase back to his normal dosage. His father believes that he received Abilify maintain a 400 mg on December 4 Patient is also on Depakene 20 mL daily.  We will place 10 mL twice daily for Travis time being. Add MiraLAX Monitor blood pressure Obtain CBC with a differential to monitor ANC tomorrow morning     I certify that inpatient services furnished can reasonably be expected to improve Travis patient's condition.    Reggie Pile, MD 12/27/20239:43 AM

## 2022-04-17 NOTE — BH IP Treatment Plan (Signed)
Interdisciplinary Treatment and Diagnostic Plan Update  04/17/2022 Time of Session: 9:00AM Nkosi Cortright MRN: [id number]  Principal Diagnosis: Psychosis [facility])  Secondary Diagnoses: Principal Problem:   Psychosis ([place])   Current Medications:  Current Facility-Administered Medications  Medication Dose Route Frequency Provider Last Rate Last Admin   acetaminophen (TYLENOL) tablet 650 mg  650 mg Oral Q6H PRN [name], NP       alum & mag hydroxide-simeth (MAALOX/MYLANTA) 200-200-20 MG/5ML suspension 30 mL  30 mL Oral Q4H PRN [name], NP       cloZAPine (CLOZARIL) tablet 100 mg  100 mg Oral Daily [name], MD       ziprasidone (GEODON) injection 20 mg  20 mg Intramuscular Q12H PRN [name], MD       And   LORazepam (ATIVAN) tablet 2 mg  2 mg Oral PRN [name], MD       magnesium hydroxide (MILK OF MAGNESIA) suspension 30 mL  30 mL Oral Daily PRN [name], NP       OLANZapine zydis (ZYPREXA) disintegrating tablet 10 mg  10 mg Oral TID PRN [name], MD       polyethylene glycol (MIRALAX / GLYCOLAX) packet 17 g  17 g Oral Daily [name], MD       valproic acid (DEPAKENE) 250 MG/5ML solution 500 mg  500 mg Oral BID [name], MD       PTA Medications: Medications Prior to Admission  Medication Sig Dispense Refill Last Dose   ABILIFY MAINTENA 400 MG SRER injection Inject 400 mg into the muscle every 28 (twenty-eight) days.      ARIPiprazole (ABILIFY) 10 MG tablet Take 10 mg by mouth daily. (Patient not taking: Reported on 04/16/2022)      atomoxetine (STRATTERA) 40 MG capsule Take 40 mg by mouth daily. (Patient not taking: Reported on 04/16/2022)      atorvastatin (LIPITOR) 40 MG tablet Take 40 mg by mouth daily.      budesonide-formoterol (SYMBICORT) 160-4.5 MCG/ACT inhaler Inhale 2 puffs into the lungs.      cetirizine (ZYRTEC) 10 MG tablet Take 10 mg by mouth daily.      clozapine (CLOZARIL) 200 MG tablet Take 200 mg by mouth 2 (two)  times daily. Take along with one 25 mg tablet for total 225 mg twice daily      cloZAPine (CLOZARIL) 25 MG tablet Take 25 mg by mouth 2 (two) times daily. Take along with one 200 mg tablet for total 225 mg twice daily      Dexlansoprazole (DEXILANT) 30 MG capsule Take 30 mg by mouth daily. (Patient not taking: Reported on 04/16/2022)      hydrOXYzine (VISTARIL) 50 MG capsule Take 50 mg by mouth 3 (three) times daily as needed for itching. (Patient not taking: Reported on 04/16/2022)      Melatonin 5 MG TABS Take 5 mg by mouth at bedtime as needed (sleep).      metoprolol succinate (TOPROL-XL) 25 MG 24 hr tablet Take 12.5 mg by mouth daily.      Phosphatidyl Choline 65 MG TABS Take 1 tablet by mouth daily.  (Patient not taking: Reported on 04/16/2022)      valproic acid (DEPAKENE) 250 MG/5ML solution Take 750-1,000 mLs by mouth 2 (two) times daily. 1000 mg (20 mL) every morning and 750 mg (15 mL) daily at bedtime       Patient Stressors:    Patient Strengths:    Treatment Modalities:  Medication Management, Group therapy, Case management,  1 to 1 session with clinician, Psychoeducation, Recreational therapy.   Physician Treatment Plan for Primary Diagnosis: Psychosis ([place]) Long Term Goal(s):     Short Term Goals: None, pt is a poor hx.  Medication Management: Evaluate patient's response, side effects, and tolerance of medication regimen.  Therapeutic Interventions: 1 to 1 sessions, Unit Group sessions and Medication administration.  Evaluation of Outcomes: Not Met  Physician Treatment Plan for Secondary Diagnosis: Principal Problem:   Psychosis ([place])  Long Term Goal(s):     Short Term Goals: None, pt is a poor hx.     Medication Management: Evaluate patient's response, side effects, and tolerance of medication regimen.  Therapeutic Interventions: 1 to 1 sessions, Unit Group sessions and Medication administration.  Evaluation of Outcomes: Not Met   RN Treatment Plan for  Primary Diagnosis: Psychosis ([place]) Long Term Goal(s): Knowledge of disease and therapeutic regimen to maintain health will improve  Short Term Goals: Ability to verbalize frustration and anger appropriately will improve, Ability to demonstrate self-control, Ability to participate in decision making will improve, Ability to verbalize feelings will improve, Ability to disclose and discuss suicidal ideas, Ability to identify and develop effective coping behaviors will improve, and Compliance with prescribed medications will improve  Medication Management: RN will administer medications as ordered by provider, will assess and evaluate patient's response and provide education to patient for prescribed medication. RN will report any adverse and/or side effects to prescribing provider.  Therapeutic Interventions: 1 on 1 counseling sessions, Psychoeducation, Medication administration, Evaluate responses to treatment, Monitor vital signs and CBGs as ordered, Perform/monitor CIWA, COWS, AIMS and Fall Risk screenings as ordered, Perform wound care treatments as ordered.  Evaluation of Outcomes: Not Met   LCSW Treatment Plan for Primary Diagnosis: Psychosis ([place]) Long Term Goal(s): Safe transition to appropriate next level of care at discharge, Engage patient in therapeutic group addressing interpersonal concerns.  Short Term Goals: Engage patient in aftercare planning with referrals and resources, Increase social support, Increase ability to appropriately verbalize feelings, Increase emotional regulation, Facilitate acceptance of mental health diagnosis and concerns, Facilitate patient progression through stages of change regarding substance use diagnoses and concerns, Identify triggers associated with mental health/substance abuse issues, and Increase skills for wellness and recovery  Therapeutic Interventions: Assess for all discharge needs, 1 to 1 time with Social worker, Explore available resources and  support systems, Assess for adequacy in community support network, Educate family and significant other(s) on suicide prevention, Complete Psychosocial Assessment, Interpersonal group therapy.  Evaluation of Outcomes: Not Met   Progress in Treatment: Attending groups: No. Participating in groups: No. Taking medication as prescribed: Yes. Toleration medication: Yes. Family/Significant other contact made: No, will contact:  once permission is given. Patient understands diagnosis: No. Discussing patient identified problems/goals with staff: No. Medical problems stabilized or resolved: Yes. Denies suicidal/homicidal ideation: Yes. Issues/concerns per patient self-inventory: No. Other: none  New problem(s) identified: No, Describe:  none  New Short Term/Long Term Goal(s): detox, elimination of symptoms of psychosis, medication management for mood stabilization; elimination of SI thoughts; development of comprehensive mental wellness/sobriety plan.   Patient Goals:  Patient attended treatment team and provided a goal, however, it was nonsensical.  Patient presents disorganized and labile.  Discharge Plan or Barriers: CSW to assist in the development of appropriate discharge plans.   Reason for Continuation of Hospitalization: Aggression Anxiety Delusions  Hallucinations Medical Issues Medication stabilization Suicidal ideation  Estimated Length of Stay:  1-7 days  Last 3 [place] Suicide Severity Risk Score: Flowsheet Row Admission (Current) from 04/16/2022 in [facility] ED from 04/15/2022 in [facility] No Risk No Risk       Last PHQ 2/9 Scores:     No data to display          Scribe for Treatment Team: Rozann Lesches, Marlinda Mike 04/17/2022 11:38 AM

## 2022-04-17 NOTE — Plan of Care (Signed)
  Problem: Education: Goal: Will be free of psychotic symptoms Outcome: Not Progressing   Problem: Role Relationship: Goal: Ability to communicate needs accurately will improve Outcome: Not Progressing

## 2022-04-17 NOTE — [facility] Suicide Risk Assessment (Signed)
[facility] Admission Suicide Risk Assessment   Nursing information obtained from:  Patient Demographic factors:  Male   Total Time spent with patient: 1 hour Principal Problem: Psychosis (HCC) Diagnosis:  Schizoaffective disorder, bipolar type per history ADHD, per history Medication noncompliance History of Hepatitis C  Subjective Data:  [name] is a 41 year old male with a history of schizoaffective disorder, ADHD and hepatitis C who was brought to the emergency department due to manic decompensation after running away from his group home and then found 1 mile from there.  He had indicated that the group home was trying to kill him.     Today he indicates that they were "making Eboli" which scared him.  States that he needs a "toxic grain bath" in order to rid himself of the toxins.  At this time he demands an intramuscular blood sample to verify his DNA and identity.  Says that he last had this done 20,000 years ago when he went by a different alias.  He indicates that he was admitted to [facility] for a year and a half and then was released on a top secret mission to the group home--he believes it is called [facility] but is not sure.  He denies having any mental health issues.  Denies any stressors in life.  Denies symptoms psychosis mania as well as suicidal or homicidal thoughts.   Shares that he has 552 kids in this hospital and has many wives.  Indicates that he is not spoken to his family in years, and comments that they are all "immortal."  Denies taking any medications prior to his admission.  He is calm and cooperative at this time but he received IM droperidol and Ativan in the emergency department due to aggression, and apparently had barricaded himself in the bathroom, suspicious that police were trying to plant a chip in him.  His urine drug screen is negative.   Continued Clinical Symptoms:  Alcohol Use Disorder Identification Test Final Score (AUDIT):  0 The "Alcohol Use Disorders Identification Test", Guidelines for Use in Primary Care, Second Edition.  World Science writer [facility]). Score between 0-7:  no or low risk or alcohol related problems. Score between 8-15:  moderate risk of alcohol related problems. Score between 16-19:  high risk of alcohol related problems. Score 20 or above:  warrants further diagnostic evaluation for alcohol dependence and treatment.    Psychiatric Specialty Exam:   Presentation  General Appearance:  Disheveled   Eye Contact: Minimal   Speech: Pressured   Speech Volume: Normal   Handedness: Right     Mood and Affect  Mood:     Affect: Inappropriate     Thought Process  Thought Processes: Disorganized   Duration of Psychotic Symptoms:N/A Past Diagnosis of Schizophrenia or Psychoactive disorder: No data recorded Descriptions of Associations:Loose   Orientation:Partial   Thought Content:Illogical; Scattered   Hallucinations:No data recorded Ideas of Reference:Delusions; Paranoia   Suicidal Thoughts:No data recorded Homicidal Thoughts:No data recorded   Sensorium  Memory: Immediate Poor; Recent Poor; Remote Poor   Judgment: Poor   Insight: Poor     Executive Functions  Concentration: Fair   Attention Span: Fair   Recall: Poor   Fund of Knowledge: Poor   Language: Poor     Psychomotor Activity  Psychomotor Activity:No data recorded   Assets  Assets: Desire for Improvement; Social Support     Sleep  Sleep:No data recorded      Physical Exam: Physical Exam ROS  Blood pressure 114/74, pulse (!) 54, temperature 98 F (36.7 C), temperature source Oral, resp. rate 16, height 5\' 9"  (1.753 m), weight 96.4 kg, SpO2 94 %. Body mass index is 31.38 kg/m.   COGNITIVE FEATURES THAT CONTRIBUTE TO RISK:  None    SUICIDE RISK:   Severe:  Frequent, intense, and enduring suicidal ideation, specific plan, no subjective intent, but some objective  markers of intent (i.e., choice of lethal method), the method is accessible, some limited preparatory behavior, evidence of impaired self-control, severe dysphoria/symptomatology, multiple risk factors present, and few if any protective factors, particularly a lack of social support.  PLAN OF CARE:  Called pt's father at [phone] for collateral. Pt does not have capacity and so information is needed urgently for stabilization. Labs and tests reviewed Restart home medications  I certify that inpatient services furnished can reasonably be expected to improve the patient's condition.   [phone] [id number], MD 04/17/2022, 10:01 AM

## 2022-04-17 NOTE — Group Note (Signed)
[facility] LCSW Group Therapy Note   Group Date: 04/17/2022 Start Time: 1300 End Time: 1400   Type of Therapy/Topic:  Group Therapy:  Emotion Regulation  Participation Level:  Did Not Attend    Description of Group:    The purpose of this group is to assist patients in learning to regulate negative emotions and experience positive emotions. Patients will be guided to discuss ways in which they have been vulnerable to their negative emotions. These vulnerabilities will be juxtaposed with experiences of positive emotions or situations, and patients challenged to use positive emotions to combat negative ones. Special emphasis will be placed on coping with negative emotions in conflict situations, and patients will process healthy conflict resolution skills.  Therapeutic Goals: Patient will identify two positive emotions or experiences to reflect on in order to balance out negative emotions:  Patient will label two or more emotions that they find the most difficult to experience:  Patient will be able to demonstrate positive conflict resolution skills through discussion or role plays:   Summary of Patient Progress: Patient declined to attend group despite invitation.     Therapeutic Modalities:   Cognitive Behavioral Therapy Feelings Identification Dialectical Behavioral Therapy   Namrata Dangler R Yasemin Rabon, LCSW 

## 2022-04-17 NOTE — [facility] Counselor (Signed)
CSW attempted to complete PSA with the pt.  Pt remained disorganized and assessment was unable to be completed.  Penni Homans, MSW, LCSW 04/17/2022 1:59 PM

## 2022-04-17 NOTE — Consult Note (Signed)
Pharmacy Consult - Clozapine     41 yo male ordered clozapine 100 mg PO daily  This patient's order has been reviewed for prescribing contraindications.    Clozapine REMS enrollment Verified: yes on 04/17/22 REMS patient ID: [id number] Current Outpatient Monitoring: Every 2 weeks   Home Regimen:  225 mg po BID Last dose: unknown   Dose Adjustments This Admission: Dose started at clozapine 100 mg po daily   Labs: Date    ANC    Submitted? 12/27 2800 Yes     Plan: Continue clozapine 100 mg po daily Monitor ANC at least weekly while inpatient

## 2022-04-17 NOTE — Progress Notes (Addendum)
Pt denies SI/HI/AVH and verbally agrees to approach staff if these become apparent or before harming themselves/others. Rates depression 0/10. Rates anxiety 0/10. Rates pain 0/10. During tx team when pt was asked what we could help him with during his admission, the pt started to say neologisms. Pt then got frustrated with [place] for not understanding. Pt then questioned the MD if he had a license. Pt has had bizarre behavior. Pt stated that he needed a Intramuscular identification blood draw to make sure of his identity. 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 When pt is talked to, the pt will start speaking neologisms. Pt will go back to english and then neologisms. Pt refused medication stating that he had been on that medication 7 days ago and does not need to be on it anymore. Scheduled medications administered to pt, per MD orders. RN provided support and encouragement to pt. Q15 min safety checks implemented and continued. Pt safe on the unit. RN will continue to monitor and intervene as needed.  04/17/22 0750  Psych Admission Type (Psych Patients Only)  Admission Status Involuntary  Psychosocial Assessment  Patient Complaints Isolation  Eye Contact Fair  Facial Expression Animated  Affect Anxious;Preoccupied;Inconsistent with thought content;Labile  Speech Pressured;Tangential;Rapid;Neologisms  Interaction Assertive  Motor Activity Restless  Appearance/Hygiene In scrubs;Disheveled  Behavior Characteristics Anxious;Irritable  Mood Anxious;Labile;Preoccupied  Thought Process  Coherency Circumstantial  Content Preoccupation;Delusions  Delusions Paranoid  Perception Hallucinations  Hallucination Auditory  Judgment Limited  Confusion None  Danger to Self  Current suicidal ideation? Denies  Danger to Others  Danger to Others None reported or observed

## 2022-04-18 DIAGNOSIS — F203 Undifferentiated schizophrenia: Secondary | ICD-10-CM

## 2022-04-18 LAB — RESP PANEL BY RT-PCR (RSV, FLU A&B, COVID)  RVPGX2
Influenza A by PCR: NEGATIVE
Influenza B by PCR: NEGATIVE
Resp Syncytial Virus by PCR: NEGATIVE
SARS Coronavirus 2 by RT PCR: NEGATIVE

## 2022-04-18 NOTE — Progress Notes (Signed)
Recreation Therapy Notes  INPATIENT RECREATION TR PLAN  Patient Details Name: Travis Sandoval MRN: [id number] DOB: [birth year] Today's Date: 04/18/2022  Rec Therapy Plan Is patient appropriate for Therapeutic Recreation?: Yes Treatment times per week: at least 3 Estimated Length of Stay: 5-7 days TR Treatment/Interventions: Group participation (Comment)  Discharge Criteria Pt will be discharged from therapy if:: Discharged Treatment plan/goals/alternatives discussed and agreed upon by:: Patient/family  Discharge Summary     Betrice Wanat 04/18/2022, 12:02 PM

## 2022-04-18 NOTE — Progress Notes (Signed)
Pt denies SI/HI/AVH and verbally agrees to approach staff if these become apparent or before harming themselves/others. Rates depression 0/10. Rates anxiety 0/10. Rates pain 0/10. Pt has been in his room all day. Pt refuses medication. Scheduled medications administered to pt, per MD orders. RN provided support and encouragement to pt. Q15 min safety checks implemented and continued. Pt safe on the unit. RN will continue to monitor and intervene as needed.  04/18/22 0803  Psych Admission Type (Psych Patients Only)  Admission Status Involuntary  Psychosocial Assessment  Patient Complaints Isolation  Eye Contact Fair  Facial Expression Other (Comment) (WDL)  Affect Appropriate to circumstance  Speech Tangential  Interaction Isolative  Motor Activity Other (Comment) (WDL)  Appearance/Hygiene In scrubs;Improved  Behavior Characteristics Appropriate to situation;Cooperative;Calm  Mood Preoccupied  Thought Process  Coherency Circumstantial  Content Delusions  Delusions Paranoid  Perception Hallucinations  Hallucination None reported or observed  Judgment Limited  Confusion None  Danger to Self  Current suicidal ideation? Denies  Danger to Others  Danger to Others None reported or observed

## 2022-04-18 NOTE — Progress Notes (Signed)
Recreation Therapy Notes  INPATIENT RECREATION THERAPY ASSESSMENT  Patient Details Name: Joeanthony Seeling MRN: [id number] DOB: [birth year] Today's Date: 04/18/2022       Information Obtained From: Patient  Able to Participate in Assessment/Interview: Yes  Patient Presentation: Responsive  Reason for Admission (Per Patient): Active Symptoms  Patient Stressors:    Coping Skills:   Music  Leisure Interests (2+):  Sports - Basketball, Music - Listen, Community - Theater/Cinema, Individual - TV, Sports - Swimming, Individual - Animator, Social - Friends  Frequency of Recreation/Participation: Pharmacist, community Resources:  Yes  Community Resources:  [employer], Other (Comment) Product/process development scientist)  Current Use: Yes  If no, Barriers?:    Expressed Interest in [employer] Information: Yes  [place] Residence:  [place]  Patient Main Form of Transportation: Therapist, music  Patient Strengths:  Trust worthy, honest  Patient Identified Areas of Improvement:  Patience  Patient Goal for Hospitalization:  Get my cashiers card and be on my way.  Current SI (including self-harm):  No  Current HI:  No  Current AVH: No  Staff Intervention Plan: Group Attendance, Collaborate with Interdisciplinary Treatment Team  Consent to Intern Participation: N/A  Ricke Kimoto 04/18/2022, 12:01 PM

## 2022-04-18 NOTE — [facility] Suicide Risk Assessment (Signed)
[facility] INPATIENT:  Family/Significant Other Suicide Prevention Education  Suicide Prevention Education:  Patient Refusal for Family/Significant Other Suicide Prevention Education: The patient Travis Sandoval has refused to provide written consent for family/significant other to be provided Family/Significant Other Suicide Prevention Education during admission and/or prior to discharge.  Physician notified.  SPE completed with pt, as pt refused to consent to family contact. SPI pamphlet provided to pt and pt was encouraged to share information with support network, ask questions, and talk about any concerns relating to SPE. Pt denies access to guns/firearms and verbalized understanding of information provided. Mobile Crisis information also provided to pt.   Harden Mo 04/18/2022, 2:36 PM

## 2022-04-18 NOTE — [facility] Counselor (Signed)
Adult Comprehensive Assessment  Patient ID: Ryne Mctigue, male   DOB: 03-Sep-1980, 41 y.o.   MRN: [id number]  Information Source: Information source: Patient (CSW notes that patient remains somewhat disorganized.)  Current Stressors:  Patient states their primary concerns and needs for treatment are:: "by the police for an undercover investigation" Patient states their goals for this hospitilization and ongoing recovery are:: "not going to say" Educational / Learning stressors: Unable to assess. Employment / Job issues: Unable to assess. Family Relationships: Unable to assess. Financial / Lack of resources (include bankruptcy): Unable to assess. Housing / Lack of housing: Unable to assess. Physical health (include injuries & life threatening diseases): Unable to assess. Social relationships: Unable to assess. Substance abuse: Unable to assess. Bereavement / Loss: Unable to assess.  Living/Environment/Situation:  Living Arrangements: Group Home Who else lives in the home?: Review of chart indicates that patient was living at a group home.  Paitent reports that he was staying at a hotel.  Family History:  Marital status: Single Does patient have children?: Yes How many children?:  (Patient reports that he is unsure of how many children he may have, however, he pays monthly for his children.) How is patient's relationship with their children?: Unable to assess.  Childhood History:  By whom was/is the patient raised?: Other (Comment) (Unable to assess.) Description of patient's relationship with caregiver when they were a child: Unable to assess.  Chart review indicates that patients father is pursuing guardianship. How were you disciplined when you got in trouble as a child/adolescent?: "they just let me go" Does patient have siblings?: Yes Number of Siblings: 1 Description of patient's current relationship with siblings: "I have one sister that is very conceited and htinks that  everything that I own is theirs" Did patient suffer any verbal/emotional/physical/sexual abuse as a child?: No Did patient suffer from severe childhood neglect?: No Has patient ever been sexually abused/assaulted/raped as an adolescent or adult?: No Was the patient ever a victim of a crime or a disaster?: No Witnessed domestic violence?: No Has patient been affected by domestic violence as an adult?: No  Education:  Highest grade of school patient has completed: "associates" Currently a Consulting civil engineer?: No Learning disability?: Yes What learning problems does patient have?: "they said it was reading comprehension"  Employment/Work Situation:   What is the Longest Time Patient has Held a Job?: Unable to assess. Where was the Patient Employed at that Time?: Unable to assess. Has Patient ever Been in the [employer]?:  (Unable to assess.)  Financial Resources:   Financial resources: [employer] Does patient have a Lawyer or guardian?:  (Unable to assess.)  Alcohol/Substance Abuse:   What has been your use of drugs/alcohol within the last 12 months?: Unable to assess.  Social Support System:   Forensic psychologist System: None Type of faith/religion: "I am religious" How does patient's faith help to cope with current illness?: "I pray with the people"  Leisure/Recreation:   Do You Have Hobbies?: Yes Leisure and Hobbies: "sports"  Strengths/Needs:   What is the patient's perception of their strengths?: "pollite, artistic, linguistic, athletic" Patient states they can use these personal strengths during their treatment to contribute to their recovery: Unable to assess. Patient states these barriers may affect/interfere with their treatment: Unable to assess. Patient states these barriers may affect their return to the community: Unable to assess.  Discharge Plan:   Currently receiving community mental health services: No (Chart indicates that patient receives CST  with [facility].)  Patient states concerns and preferences for aftercare planning are: Patient reports that he does not plan on continuing services with [facility]. Patient states they will know when they are safe and ready for discharge when: "it is what it is" Does patient have access to transportation?: Yes Does patient have financial barriers related to discharge medications?: No Plan for living situation after discharge: Unable to assess. Will patient be returning to same living situation after discharge?: No  Summary/Recommendations:   Summary and Recommendations (to be completed by the evaluator): Patient is a 41 year old male from [facility].  He presents to the hospital disorganized and reporting that he needs a "toxic rain bath".  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  Patient continues to present disorganized during the assessment and information was either gathered from the chart or unable to be assessed further.  Chart review indicates that he receives CST with [facility], however, patient reports that this was several months ago and declines to continue services with this provider.  Chart review also indicates that patient lives in a group home, where he ran away and was later found about one mile from the group home.  Name of the group home is unknown and treatment team has been unable to establish contact to confirm if patient can return to the home.  Patient reports that he can not return, however, this has not been confirmed.  Recommendations include: crisis stabilization, therapeutic milieu, encourage group attendance and participation, medication management for mood stabilization and development of comprehensive mental wellness plan.  Harden Mo. 04/18/2022

## 2022-04-18 NOTE — Progress Notes (Signed)
[facility]  04/18/2022 4:54 PM Roxan DieselBranson Dieckman  MRN:  [id number] Subjective: Follow-up for this 41 year old man evidently with schizophrenia.  Brought into the hospital after walking away from his group home.  Initially described as being very disorganized.  He is still disorganized today but perhaps a little better.  [facility] and was discharged from there about 5 months ago to his current group home.  His reason for walking away from it was because they were "making ebola" there and were poisoning him.  Patient has been restarted on clozapine Principal Problem: Psychosis (HCC) Diagnosis: Principal Problem:   Psychosis (HCC)  Total Time spent with patient: 30 minutes  Past Psychiatric History: Past history of schizophrenia  Past Medical History:  Past Medical History:  Diagnosis Date   ADHD    Bipolar 1 disorder (HCC)    Hepatitis C    Schizoaffective disorder (HCC)    History reviewed. No pertinent surgical history. Family History:  Family History  Family history unknown: Yes   Family Psychiatric  History: See previous Social History:  Social History   Substance and Sexual Activity  Alcohol Use Not Currently     Social History   Substance and Sexual Activity  Drug Use Not Currently    Social History   Socioeconomic History   Marital status: Unknown    Spouse name: Not on file   Number of children: Not on file   Years of education: Not on file   Highest education level: Not on file  Occupational History   Not on file  Tobacco Use   Smoking status: Every Day    Packs/day: 0.25    Years: 15.00    Total pack years: 3.75    Types: Cigarettes   Smokeless tobacco: Not on file  Vaping Use   Vaping Use: Not on file  Substance and Sexual Activity   Alcohol use: Not Currently   Drug use: Not Currently   Sexual activity: Not Currently  Other Topics Concern   Not on file  Social History Narrative   Not on file    Social Determinants of Health   Financial Resource Strain: Not on file  Food Insecurity: No Food Insecurity (04/16/2022)   Hunger Vital Sign    Worried About Running Out of Food in the Last Year: Never true    Ran Out of Food in the Last Year: Never true  Transportation Needs: No Transportation Needs (04/16/2022)   PRAPARE - Administrator, Civil ServiceTransportation    Lack of Transportation (Medical): No    Lack of Transportation (Non-Medical): No  Physical Activity: Not on file  Stress: Not on file  Social Connections: Not on file   Additional Social History:                         Sleep: Fair  Appetite:  Fair  Current Medications: Current Facility-Administered Medications  Medication Dose Route Frequency Provider Last Rate Last Admin   acetaminophen (TYLENOL) tablet 650 mg  650 mg Oral Q6H PRN Vanetta MuldersBarthold, Louise F, NP       alum & mag hydroxide-simeth (MAALOX/MYLANTA) 200-200-20 MG/5ML suspension 30 mL  30 mL Oral Q4H PRN Gabriel CirriBarthold, Louise F, NP       cloZAPine (CLOZARIL) tablet 100 mg  100 mg Oral Daily Reggie PileJoshi, Anand, MD   100 mg at 04/17/22 1513   ziprasidone (GEODON) injection 20 mg  20 mg Intramuscular Q12H PRN Reita MayJoshi,  Theadora Rama, MD       And   LORazepam (ATIVAN) tablet 2 mg  2 mg Oral PRN Reggie Pile, MD       magnesium hydroxide (MILK OF MAGNESIA) suspension 30 mL  30 mL Oral Daily PRN Vanetta Mulders, NP       OLANZapine zydis (ZYPREXA) disintegrating tablet 10 mg  10 mg Oral TID PRN Reggie Pile, MD       polyethylene glycol (MIRALAX / GLYCOLAX) packet 17 g  17 g Oral Daily Reggie Pile, MD       valproic acid (DEPAKENE) 250 MG/5ML solution 500 mg  500 mg Oral BID Reggie Pile, MD        Lab Results:  Results for orders placed or performed during the hospital encounter of 04/16/22 (from the past 48 hour(s))  Urine Drug Screen, Qualitative     Status: None   Collection Time: 04/16/22 11:28 PM  Result Value Ref Range   Tricyclic, Ur Screen NONE DETECTED NONE DETECTED    Amphetamines, Ur Screen NONE DETECTED NONE DETECTED   MDMA (Ecstasy)Ur Screen NONE DETECTED NONE DETECTED   Cocaine Metabolite,Ur [place] NONE DETECTED NONE DETECTED   Opiate, Ur Screen NONE DETECTED NONE DETECTED   Phencyclidine (PCP) Ur S NONE DETECTED NONE DETECTED   Cannabinoid 50 Ng, Ur [place] NONE DETECTED NONE DETECTED   Barbiturates, Ur Screen NONE DETECTED NONE DETECTED   Benzodiazepine, Ur Scrn NONE DETECTED NONE DETECTED   Methadone Scn, Ur NONE DETECTED NONE DETECTED    Comment: (NOTE) Tricyclics + metabolites, urine    Cutoff 1000 ng/mL Amphetamines + metabolites, urine  Cutoff 1000 ng/mL MDMA (Ecstasy), urine              Cutoff 500 ng/mL Cocaine Metabolite, urine          Cutoff 300 ng/mL Opiate + metabolites, urine        Cutoff 300 ng/mL Phencyclidine (PCP), urine         Cutoff 25 ng/mL Cannabinoid, urine                 Cutoff 50 ng/mL Barbiturates + metabolites, urine  Cutoff 200 ng/mL Benzodiazepine, urine              Cutoff 200 ng/mL Methadone, urine                   Cutoff 300 ng/mL  The urine drug screen provides only a preliminary, unconfirmed analytical test result and should not be used for non-medical purposes. Clinical consideration and professional judgment should be applied to any positive drug screen result due to possible interfering substances. A more specific alternate chemical method must be used in order to obtain a confirmed analytical result. Gas chromatography / mass spectrometry (GC/MS) is the preferred confirm atory method. Performed at [facility], [address] [place], [place] [address]   CBC with Differential/Platelet     Status: Abnormal   Collection Time: 04/17/22  4:29 PM  Result Value Ref Range   WBC 6.2 4.0 - 10.5 K/uL   RBC 4.35 4.22 - 5.81 MIL/uL   Hemoglobin 13.9 13.0 - 17.0 g/dL   HCT 44.9 67.5 - 91.6 %   MCV 94.5 80.0 - 100.0 fL   MCH 32.0 26.0 - 34.0 pg   MCHC 33.8 30.0 - 36.0 g/dL   RDW 38.4 66.5 - 99.3 %    Platelets 119 (L) 150 - 400 K/uL   nRBC 0.0 0.0 - 0.2 %  Neutrophils Relative % 46 %   Neutro Abs 2.8 1.7 - 7.7 K/uL   Lymphocytes Relative 46 %   Lymphs Abs 2.8 0.7 - 4.0 K/uL   Monocytes Relative 6 %   Monocytes Absolute 0.4 0.1 - 1.0 K/uL   Eosinophils Relative 1 %   Eosinophils Absolute 0.1 0.0 - 0.5 K/uL   Basophils Relative 1 %   Basophils Absolute 0.0 0.0 - 0.1 K/uL   Immature Granulocytes 0 %   Abs Immature Granulocytes 0.01 0.00 - 0.07 K/uL    Comment: Performed at [facility], [address], [place], [place] [address]  Resp panel by RT-PCR (RSV, Flu A&B, Covid) Anterior Nasal Swab     Status: None   Collection Time: 04/18/22 11:13 AM   Specimen: Anterior Nasal Swab  Result Value Ref Range   SARS Coronavirus 2 by RT PCR NEGATIVE NEGATIVE    Comment: (NOTE) SARS-CoV-2 target nucleic acids are NOT DETECTED.  The SARS-CoV-2 RNA is generally detectable in upper respiratory specimens during the acute phase of infection. The lowest concentration of SARS-CoV-2 viral copies this assay can detect is 138 copies/mL. A negative result does not preclude SARS-Cov-2 infection and should not be used as the sole basis for treatment or other patient management decisions. A negative result may occur with  improper specimen collection/handling, submission of specimen other than nasopharyngeal swab, presence of viral mutation(s) within the areas targeted by this assay, and inadequate number of viral copies(<138 copies/mL). A negative result must be combined with clinical observations, patient history, and epidemiological information. The expected result is Negative.  Fact Sheet for Patients:  BloggerCourse.com  Fact Sheet for Healthcare Providers:  SeriousBroker.it  This test is no t yet approved or cleared by the [place] FDA and  has been authorized for detection and/or diagnosis of SARS-CoV-2 by FDA under an  Emergency Use Authorization (EUA). This EUA will remain  in effect (meaning this test can be used) for the duration of the COVID-19 declaration under Section 564(b)(1) of the Act, 21 U.S.C.section 360bbb-3(b)(1), unless the authorization is terminated  or revoked sooner.       Influenza A by PCR NEGATIVE NEGATIVE   Influenza B by PCR NEGATIVE NEGATIVE    Comment: (NOTE) The Xpert Xpress SARS-CoV-2/FLU/RSV plus assay is intended as an aid in the diagnosis of influenza from Nasopharyngeal swab specimens and should not be used as a sole basis for treatment. Nasal washings and aspirates are unacceptable for Xpert Xpress SARS-CoV-2/FLU/RSV testing.  Fact Sheet for Patients: BloggerCourse.com  Fact Sheet for Healthcare Providers: SeriousBroker.it  This test is not yet approved or cleared by the [place] FDA and has been authorized for detection and/or diagnosis of SARS-CoV-2 by FDA under an Emergency Use Authorization (EUA). This EUA will remain in effect (meaning this test can be used) for the duration of the COVID-19 declaration under Section 564(b)(1) of the Act, 21 U.S.C. section 360bbb-3(b)(1), unless the authorization is terminated or revoked.     Resp Syncytial Virus by PCR NEGATIVE NEGATIVE    Comment: (NOTE) Fact Sheet for Patients: BloggerCourse.com  Fact Sheet for Healthcare Providers: SeriousBroker.it  This test is not yet approved or cleared by the [place] FDA and has been authorized for detection and/or diagnosis of SARS-CoV-2 by FDA under an Emergency Use Authorization (EUA). This EUA will remain in effect (meaning this test can be used) for the duration of the COVID-19 declaration under Section 564(b)(1) of the Act, 21 U.S.C. section 360bbb-3(b)(1), unless the  authorization is terminated or revoked.  Performed at [facility], [address] [place], [place] [address]     Blood Alcohol level:  Lab Results  Component Value Date   [facility] <10 04/15/2022    Metabolic Disorder Labs: No results found for: "HGBA1C", "MPG" No results found for: "PROLACTIN" No results found for: "CHOL", "TRIG", "HDL", "CHOLHDL", "VLDL", "LDLCALC"  Physical Findings: AIMS: Facial and Oral Movements Muscles of Facial Expression: None, normal Lips and Perioral Area: None, normal Jaw: None, normal Tongue: None, normal,Extremity Movements Upper (arms, wrists, hands, fingers): None, normal Lower (legs, knees, ankles, toes): None, normal, Trunk Movements Neck, shoulders, hips: None, normal, Overall Severity Severity of abnormal movements (highest score from questions above): None, normal Incapacitation due to abnormal movements: None, normal Patient's awareness of abnormal movements (rate only patient's report): No Awareness, Dental Status Current problems with teeth and/or dentures?: No Does patient usually wear dentures?: No  CIWA:    COWS:     Musculoskeletal: Strength & Muscle Tone: within normal limits Gait & Station: normal Patient leans: N/A  Psychiatric Specialty Exam:  Presentation  General Appearance:  Disheveled  Eye Contact: Minimal  Speech: Pressured  Speech Volume: Normal  Handedness: Right   Mood and Affect  Mood: Euphoric; Irritable  Affect: Inappropriate   Thought Process  Thought Processes: Disorganized  Descriptions of Associations:Loose  Orientation:Partial  Thought Content:Illogical; Scattered  History of Schizophrenia/Schizoaffective disorder:No data recorded Duration of Psychotic Symptoms:No data recorded Hallucinations:No data recorded Ideas of Reference:Delusions; Paranoia  Suicidal Thoughts:No data recorded Homicidal Thoughts:No data recorded  Sensorium  Memory: Immediate Poor; Recent Poor; Remote Poor  Judgment: Poor  Insight: Poor   Executive Functions   Concentration: Fair  Attention Span: Fair  Recall: Poor  Fund of Knowledge: Poor  Language: Poor   Psychomotor Activity  Psychomotor Activity:No data recorded  Assets  Assets: Desire for Improvement; Social Support   Sleep  Sleep:No data recorded   Physical Exam: Physical Exam Vitals and nursing note reviewed.  Constitutional:      Appearance: Normal appearance.  HENT:     Head: Normocephalic and atraumatic.     Mouth/Throat:     Pharynx: Oropharynx is clear.  Eyes:     Pupils: Pupils are equal, round, and reactive to light.  Cardiovascular:     Rate and Rhythm: Normal rate and regular rhythm.  Pulmonary:     Effort: Pulmonary effort is normal.     Breath sounds: Normal breath sounds.  Abdominal:     General: Abdomen is flat.     Palpations: Abdomen is soft.  Musculoskeletal:        General: Normal range of motion.  Skin:    General: Skin is warm and dry.  Neurological:     General: No focal deficit present.     Mental Status: He is alert. Mental status is at baseline.  Psychiatric:        Attention and Perception: He is inattentive.        Mood and Affect: Mood normal. Affect is blunt.        Speech: Speech is tangential.        Behavior: Behavior is cooperative.        Thought Content: Thought content is delusional.        Cognition and Memory: Cognition is impaired.    Review of Systems  Constitutional: Negative.   HENT: Negative.    Eyes: Negative.   Respiratory: Negative.    Cardiovascular: Negative.  Gastrointestinal: Negative.   Musculoskeletal: Negative.   Skin: Negative.   Neurological: Negative.   Psychiatric/Behavioral: Negative.     Blood pressure 123/71, pulse 61, temperature 97.8 F (36.6 C), temperature source Oral, resp. rate 16, height  (1.753 m), weight 96.4 kg, SpO2 99 %. Body mass index is 31.38 kg/m.   Treatment Plan Summary: Medication management and Plan continue current medicine.  I will increase his  clozapine in another couple days.  Daily monitoring of improvement in mental state.  Mordecai Rasmussen, MD 04/18/2022, 4:54 PM

## 2022-04-18 NOTE — Progress Notes (Signed)
Recreation Therapy Notes   Date: 04/18/2022  Time: 10:00 am  Location: Craft room     Behavioral response: N/A   Intervention Topic: Coping Skills   Discussion/Intervention: Patient refused to attend group.   Clinical Observations/Feedback:  Patient refused to attend group.    Khai Torbert LRT/CTRS        Mickie Badders 04/18/2022 11:21 AM

## 2022-04-18 NOTE — Plan of Care (Signed)
  Problem: Coping: Goal: Level of anxiety will decrease Outcome: Progressing   Problem: Education: Goal: Will be free of psychotic symptoms Outcome: Progressing

## 2022-04-18 NOTE — Group Note (Signed)
[facility] LCSW Group Therapy Note    Group Date: 04/18/2022 Start Time: 1300 End Time: 1400  Type of Therapy and Topic:  Group Therapy:  Overcoming Obstacles  Participation Level:  [facility] PARTICIPATION LEVEL: Did Not Attend  Mood:  Description of Group:   In this group patients will be encouraged to explore what they see as obstacles to their own wellness and recovery. They will be guided to discuss their thoughts, feelings, and behaviors related to these obstacles. The group will process together ways to cope with barriers, with attention given to specific choices patients can make. Each patient will be challenged to identify changes they are motivated to make in order to overcome their obstacles. This group will be process-oriented, with patients participating in exploration of their own experiences as well as giving and receiving support and challenge from other group members.  Therapeutic Goals: 1. Patient will identify personal and current obstacles as they relate to admission. 2. Patient will identify barriers that currently interfere with their wellness or overcoming obstacles.  3. Patient will identify feelings, thought process and behaviors related to these barriers. 4. Patient will identify two changes they are willing to make to overcome these obstacles:    Summary of Patient Progress   Patient declined to attend group despite being encouraged to do so by this clinician.    Therapeutic Modalities:   Cognitive Behavioral Therapy Solution Focused Therapy Motivational Interviewing Relapse Prevention Therapy   Ajane Novella J Logun Colavito, LCSW 

## 2022-04-18 NOTE — Progress Notes (Signed)
   04/18/22 0000  Psych Admission Type (Psych Patients Only)  Admission Status Involuntary  Psychosocial Assessment  Patient Complaints Depression;Isolation  Eye Contact Fair  Facial Expression Animated  Affect Labile  Speech Pressured;Tangential  Interaction Assertive  Motor Activity Restless  Appearance/Hygiene Disheveled  Behavior Characteristics Anxious  Mood Labile;Preoccupied  Thought Process  Coherency Circumstantial  Content Delusions  Delusions Paranoid  Perception Hallucinations  Hallucination Auditory  Judgment Limited  Confusion None  Danger to Self  Current suicidal ideation? Denies (Denies)  Danger to Others  Danger to Others None reported or observed   Patient continues to be isolative to his room endosing Auditory hallucinations denies SI/HA/VH and verbally  contracted for safety. Support and encouragement provided.

## 2022-04-18 NOTE — Plan of Care (Signed)
  Problem: Nutrition: Goal: Adequate nutrition will be maintained Outcome: Progressing   Problem: Activity: Goal: Sleeping patterns will improve Outcome: Progressing   Problem: Health Behavior/Discharge Planning: Goal: Compliance with prescribed medication regimen will improve Outcome: Progressing

## 2022-04-18 NOTE — [facility] Counselor (Signed)
Patient declined collateral contact as well as for CSW to speak with [employer].  Patient would only provide verbal permission for CSW to speak with [facility].  Penni Homans, MSW, LCSW 04/18/2022 2:37 PM

## 2022-04-19 DIAGNOSIS — F203 Undifferentiated schizophrenia: Principal | ICD-10-CM | POA: Diagnosis present

## 2022-04-19 DIAGNOSIS — F209 Schizophrenia, unspecified: Principal | ICD-10-CM | POA: Insufficient documentation

## 2022-04-19 MED ORDER — OLANZAPINE 5 MG PO TBDP: 15.0000 mg | ORAL_TABLET | Freq: Every day | ORAL | Status: DC

## 2022-04-19 MED ORDER — CLOZAPINE 100 MG PO TABS
100.0000 mg | ORAL_TABLET | Freq: Every day | ORAL | Status: DC
  Administered 2022-04-22 – 2022-04-25 (×4): 100 mg via ORAL
  Filled 2022-04-19 (×4): qty 1

## 2022-04-19 NOTE — Group Note (Signed)
LCSW Group Therapy Note  Group Date: 04/19/2022 Start Time: 1300 End Time: 1400   Type of Therapy and Topic:  Group Therapy - Healthy vs Unhealthy Coping Skills  Participation Level:  Did Not Attend   Description of Group The focus of this group was to determine what unhealthy coping techniques typically are used by group members and what healthy coping techniques would be helpful in coping with various problems. Patients were guided in becoming aware of the differences between healthy and unhealthy coping techniques. Patients were asked to identify 2-3 healthy coping skills they would like to learn to use more effectively.  Therapeutic Goals Patients learned that coping is what human beings do all day long to deal with various situations in their lives Patients defined and discussed healthy vs unhealthy coping techniques Patients identified their preferred coping techniques and identified whether these were healthy or unhealthy Patients determined 2-3 healthy coping skills they would like to become more familiar with and use more often. Patients provided support and ideas to each other   Summary of Patient Progress:  Pt did not attend group despite invitation.    Therapeutic Modalities Cognitive Behavioral Therapy Motivational Interviewing  Glenis Smoker, Theresia Majors 04/19/2022  3:30 PM

## 2022-04-19 NOTE — Progress Notes (Signed)
[facility] MD Progress Note  04/19/2022 1:58 PM Travis Sandoval  MRN:  [id number] Subjective: Patient seen and chart reviewed.  Patient was schizophrenia.  Still stays in his room most of the time.  When I went to see him today he denied having hallucinations denied being depressed denied any symptoms.  Not talking very much.  I note that he refused his medicines today. Principal Problem: Schizophrenia ([place]) Diagnosis: Principal Problem:   Schizophrenia ([place]) Active Problems:   Psychosis ([place])  Total Time spent with patient: 30 minutes  Past Psychiatric History: Past history of schizophrenia and multiple hospitalizations.  Past Medical History:  Past Medical History:  Diagnosis Date   ADHD    Bipolar 1 disorder ([place])    Hepatitis C    Schizoaffective disorder ([place])    History reviewed. No pertinent surgical history. Family History:  Family History  Family history unknown: Yes   Family Psychiatric  History: No information Social History:  Social History   Substance and Sexual Activity  Alcohol Use Not Currently     Social History   Substance and Sexual Activity  Drug Use Not Currently    Social History   Socioeconomic History   Marital status: Unknown    Spouse name: Not on file   Number of children: Not on file   Years of education: Not on file   Highest education level: Not on file  Occupational History   Not on file  Tobacco Use   Smoking status: Every Day    Packs/day: 0.25    Years: 15.00    Total pack years: 3.75    Types: Cigarettes   Smokeless tobacco: Not on file  Vaping Use   Vaping Use: Not on file  Substance and Sexual Activity   Alcohol use: Not Currently   Drug use: Not Currently   Sexual activity: Not Currently  Other Topics Concern   Not on file  Social History Narrative   Not on file   Social Determinants of Health   Financial Resource Strain: Not on file  Food Insecurity: No Food Insecurity (04/16/2022)   Hunger Vital Sign    Worried  About Running Out of Food in the Last Year: Never true    Ran Out of Food in the Last Year: Never true  Transportation Needs: No Transportation Needs (04/16/2022)   PRAPARE - Hydrologist (Medical): No    Lack of Transportation (Non-Medical): No  Physical Activity: Not on file  Stress: Not on file  Social Connections: Not on file   Additional Social History:                         Sleep: Fair  Appetite:  Fair  Current Medications: Current Facility-Administered Medications  Medication Dose Route Frequency Provider Last Rate Last Admin   acetaminophen (TYLENOL) tablet 650 mg  650 mg Oral Q6H PRN [name], NP       alum & mag hydroxide-simeth (MAALOX/MYLANTA) 200-200-20 MG/5ML suspension 30 mL  30 mL Oral Q4H PRN [name], NP       cloZAPine (CLOZARIL) tablet 100 mg  100 mg Oral QHS Yuliya Nova T, MD       ziprasidone (GEODON) injection 20 mg  20 mg Intramuscular Q12H PRN [name], MD       And   LORazepam (ATIVAN) tablet 2 mg  2 mg Oral PRN [name], MD       magnesium  hydroxide (MILK OF MAGNESIA) suspension 30 mL  30 mL Oral Daily PRN Vanetta Mulders, NP       OLANZapine zydis (ZYPREXA) disintegrating tablet 10 mg  10 mg Oral TID PRN Reggie Pile, MD       polyethylene glycol (MIRALAX / GLYCOLAX) packet 17 g  17 g Oral Daily Reggie Pile, MD       valproic acid (DEPAKENE) 250 MG/5ML solution 500 mg  500 mg Oral BID Reggie Pile, MD        Lab Results:  Results for orders placed or performed during the hospital encounter of 04/16/22 (from the past 48 hour(s))  CBC with Differential/Platelet     Status: Abnormal   Collection Time: 04/17/22  4:29 PM  Result Value Ref Range   WBC 6.2 4.0 - 10.5 K/uL   RBC 4.35 4.22 - 5.81 MIL/uL   Hemoglobin 13.9 13.0 - 17.0 g/dL   HCT 40.1 02.7 - 25.3 %   MCV 94.5 80.0 - 100.0 fL   MCH 32.0 26.0 - 34.0 pg   MCHC 33.8 30.0 - 36.0 g/dL   RDW 66.4 40.3 - 47.4 %   Platelets  119 (L) 150 - 400 K/uL   nRBC 0.0 0.0 - 0.2 %   Neutrophils Relative % 46 %   Neutro Abs 2.8 1.7 - 7.7 K/uL   Lymphocytes Relative 46 %   Lymphs Abs 2.8 0.7 - 4.0 K/uL   Monocytes Relative 6 %   Monocytes Absolute 0.4 0.1 - 1.0 K/uL   Eosinophils Relative 1 %   Eosinophils Absolute 0.1 0.0 - 0.5 K/uL   Basophils Relative 1 %   Basophils Absolute 0.0 0.0 - 0.1 K/uL   Immature Granulocytes 0 %   Abs Immature Granulocytes 0.01 0.00 - 0.07 K/uL    Comment: Performed at [facility], [address] [place], [place] [address]  Resp panel by RT-PCR (RSV, Flu A&B, Covid) Anterior Nasal Swab     Status: None   Collection Time: 04/18/22 11:13 AM   Specimen: Anterior Nasal Swab  Result Value Ref Range   SARS Coronavirus 2 by RT PCR NEGATIVE NEGATIVE    Comment: (NOTE) SARS-CoV-2 target nucleic acids are NOT DETECTED.  The SARS-CoV-2 RNA is generally detectable in upper respiratory specimens during the acute phase of infection. The lowest concentration of SARS-CoV-2 viral copies this assay can detect is 138 copies/mL. A negative result does not preclude SARS-Cov-2 infection and should not be used as the sole basis for treatment or other patient management decisions. A negative result may occur with  improper specimen collection/handling, submission of specimen other than nasopharyngeal swab, presence of viral mutation(s) within the areas targeted by this assay, and inadequate number of viral copies(<138 copies/mL). A negative result must be combined with clinical observations, patient history, and epidemiological information. The expected result is Negative.  Fact Sheet for Patients:  BloggerCourse.com  Fact Sheet for Healthcare Providers:  SeriousBroker.it  This test is no t yet approved or cleared by the [place] FDA and  has been authorized for detection and/or diagnosis of SARS-CoV-2 by FDA under an Emergency Use  Authorization (EUA). This EUA will remain  in effect (meaning this test can be used) for the duration of the COVID-19 declaration under Section 564(b)(1) of the Act, 21 U.S.C.section 360bbb-3(b)(1), unless the authorization is terminated  or revoked sooner.       Influenza A by PCR NEGATIVE NEGATIVE   Influenza B by PCR NEGATIVE NEGATIVE  Comment: (NOTE) The Xpert Xpress SARS-CoV-2/FLU/RSV plus assay is intended as an aid in the diagnosis of influenza from Nasopharyngeal swab specimens and should not be used as a sole basis for treatment. Nasal washings and aspirates are unacceptable for Xpert Xpress SARS-CoV-2/FLU/RSV testing.  Fact Sheet for Patients: BloggerCourse.com  Fact Sheet for Healthcare Providers: SeriousBroker.it  This test is not yet approved or cleared by the [place] FDA and has been authorized for detection and/or diagnosis of SARS-CoV-2 by FDA under an Emergency Use Authorization (EUA). This EUA will remain in effect (meaning this test can be used) for the duration of the COVID-19 declaration under Section 564(b)(1) of the Act, 21 U.S.C. section 360bbb-3(b)(1), unless the authorization is terminated or revoked.     Resp Syncytial Virus by PCR NEGATIVE NEGATIVE    Comment: (NOTE) Fact Sheet for Patients: BloggerCourse.com  Fact Sheet for Healthcare Providers: SeriousBroker.it  This test is not yet approved or cleared by the [place] FDA and has been authorized for detection and/or diagnosis of SARS-CoV-2 by FDA under an Emergency Use Authorization (EUA). This EUA will remain in effect (meaning this test can be used) for the duration of the COVID-19 declaration under Section 564(b)(1) of the Act, 21 U.S.C. section 360bbb-3(b)(1), unless the authorization is terminated or revoked.  Performed at [facility], [address]  [place], [place] [address]     Blood Alcohol level:  Lab Results  Component Value Date   [facility] <10 04/15/2022    Metabolic Disorder Labs: No results found for: "HGBA1C", "MPG" No results found for: "PROLACTIN" No results found for: "CHOL", "TRIG", "HDL", "CHOLHDL", "VLDL", "LDLCALC"  Physical Findings: AIMS: Facial and Oral Movements Muscles of Facial Expression: None, normal Lips and Perioral Area: None, normal Jaw: None, normal Tongue: None, normal,Extremity Movements Upper (arms, wrists, hands, fingers): None, normal Lower (legs, knees, ankles, toes): None, normal, Trunk Movements Neck, shoulders, hips: None, normal, Overall Severity Severity of abnormal movements (highest score from questions above): None, normal Incapacitation due to abnormal movements: None, normal Patient's awareness of abnormal movements (rate only patient's report): No Awareness, Dental Status Current problems with teeth and/or dentures?: No Does patient usually wear dentures?: No  CIWA:    COWS:     Musculoskeletal: Strength & Muscle Tone: within normal limits Gait & Station: normal Patient leans: N/A  Psychiatric Specialty Exam:  Presentation  General Appearance:  Disheveled  Eye Contact: Minimal  Speech: Pressured  Speech Volume: Normal  Handedness: Right   Mood and Affect  Mood: Euphoric; Irritable  Affect: Inappropriate   Thought Process  Thought Processes: Disorganized  Descriptions of Associations:Loose  Orientation:Partial  Thought Content:Illogical; Scattered  History of Schizophrenia/Schizoaffective disorder:No data recorded Duration of Psychotic Symptoms:No data recorded Hallucinations:No data recorded Ideas of Reference:Delusions; Paranoia  Suicidal Thoughts:No data recorded Homicidal Thoughts:No data recorded  Sensorium  Memory: Immediate Poor; Recent Poor; Remote Poor  Judgment: Poor  Insight: Poor   Executive Functions   Concentration: Fair  Attention Span: Fair  Recall: Poor  Fund of Knowledge: Poor  Language: Poor   Psychomotor Activity  Psychomotor Activity:No data recorded  Assets  Assets: Desire for Improvement; Social Support   Sleep  Sleep:No data recorded   Physical Exam: Physical Exam Vitals and nursing note reviewed.  Constitutional:      Appearance: Normal appearance.  HENT:     Head: Normocephalic and atraumatic.     Mouth/Throat:     Pharynx: Oropharynx is clear.  Eyes:     Pupils: Pupils  are equal, round, and reactive to light.  Cardiovascular:     Rate and Rhythm: Normal rate and regular rhythm.  Pulmonary:     Effort: Pulmonary effort is normal.     Breath sounds: Normal breath sounds.  Abdominal:     General: Abdomen is flat.     Palpations: Abdomen is soft.  Musculoskeletal:        General: Normal range of motion.  Skin:    General: Skin is warm and dry.  Neurological:     General: No focal deficit present.     Mental Status: He is alert. Mental status is at baseline.  Psychiatric:        Attention and Perception: Attention normal.        Mood and Affect: Mood normal. Affect is blunt.        Speech: Speech is delayed.        Behavior: Behavior is slowed.        Thought Content: Thought content normal.        Cognition and Memory: Cognition is impaired.    Review of Systems  Constitutional: Negative.   HENT: Negative.    Eyes: Negative.   Respiratory: Negative.    Cardiovascular: Negative.   Gastrointestinal: Negative.   Musculoskeletal: Negative.   Skin: Negative.   Neurological: Negative.   Psychiatric/Behavioral: Negative.     Blood pressure 112/67, pulse (!) 48, temperature 97.8 F (36.6 C), temperature source Oral, resp. rate 16, height 5\' 9"  (1.753 m), weight 96.4 kg, SpO2 98 %. Body mass index is 31.38 kg/m.   Treatment Plan Summary: Daily contact with patient to assess and evaluate symptoms and progress in treatment,  Medication management, and Plan orders change to have the Clozapine given at the end of the day.  Try to get him to take his clozapine dose now.  Tried to do some psychoeducation about the need to take medicine.  Patient only minimally receptive.  Only 1 physician available today so forced medication orders cannot be completed.  Patient showing minimal improvement.  Treatment team working on figuring out where his group home is in talking with him about ultimate disposition  Alethia Berthold, MD 04/19/2022, 1:58 PM

## 2022-04-19 NOTE — Plan of Care (Signed)
  Problem: Nutrition: Goal: Adequate nutrition will be maintained Outcome: Progressing   Problem: Coping: Goal: Level of anxiety will decrease Outcome: Progressing   Problem: Safety: Goal: Ability to remain free from injury will improve Outcome: Progressing   Problem: Education: Goal: Emotional status will improve Outcome: Progressing

## 2022-04-19 NOTE — Progress Notes (Signed)
Recreation Therapy Notes   Date: 04/19/2022  Time: 10:20 am  Location: Craft room     Behavioral response: N/A   Intervention Topic: Stress Management   Discussion/Intervention: Patient refused to attend group.   Clinical Observations/Feedback:  Patient refused to attend group.    Israa Caban LRT/CTRS        Tashaun Obey 04/19/2022 12:12 PM

## 2022-04-19 NOTE — Progress Notes (Signed)
Pt denies SI/HI/AVH and verbally agrees to approach staff if these become apparent or before harming themselves/others. Rates depression 0/10. Rates anxiety 0/10. Rates pain 0/10. Pt stays in his room. Pt comes out for meals or if asked to. Pt still refusing medication. RN provided support and encouragement to pt. Q15 min safety checks implemented and continued. Pt safe on the unit. RN will continue to monitor and intervene as needed.  04/19/22 0749  Psych Admission Type (Psych Patients Only)  Admission Status Involuntary  Psychosocial Assessment  Patient Complaints None  Eye Contact Fair  Facial Expression Other (Comment) (WDL)  Affect Appropriate to circumstance  Speech Logical/coherent  Interaction Isolative;Forwards little  Motor Activity Other (Comment) (WDL)  Appearance/Hygiene In scrubs  Behavior Characteristics Cooperative;Appropriate to situation;Calm  Mood Preoccupied  Thought Process  Coherency Circumstantial  Content WDL  Delusions WDL  Perception Hallucinations  Hallucination None reported or observed  Judgment Impaired  Confusion None  Danger to Self  Current suicidal ideation? Denies  Danger to Others  Danger to Others None reported or observed

## 2022-04-19 NOTE — Progress Notes (Signed)
Patient remains isolative to his room. He is approachable and will engage in conversation when approached. Denies si  hi  avh depression and anxiety.  Reports eating and sleeping well. States that he does not need any medication to help aid in sleep.  Encouraged him to come to the nurses station with any concerns.  Q 15 min safety checks in place.   C Butler-Nicholson, LPN

## 2022-04-20 DIAGNOSIS — F200 Paranoid schizophrenia: Secondary | ICD-10-CM

## 2022-04-20 MED ORDER — HALOPERIDOL 1 MG PO TABS: 2.0000 mg | ORAL_TABLET | Freq: Two times a day (BID) | ORAL | Status: DC

## 2022-04-20 NOTE — Progress Notes (Signed)
[facility] MD Progress Note  04/20/2022 10:35 AM Travis Sandoval  MRN:  [id number] Subjective: Travis Sandoval is seen on rounds.  He is very disorganized and tangential.  He has been noncompliant with his medication.  He has been under good controls.  So far. Principal Problem: Schizophrenia ([facility]) Diagnosis: Principal Problem:   Schizophrenia ([facility]) Active Problems:   Psychosis ([facility])  Total Time spent with patient: 15 minutes  Past Psychiatric History: Schizophrenia and noncompliance.  Past Medical History:  Past Medical History:  Diagnosis Date   ADHD    Bipolar 1 disorder ([facility])    Hepatitis C    Schizoaffective disorder ([facility])    History reviewed. No pertinent surgical history. Family History:  Family History  Family history unknown: Yes   Family Psychiatric  History: Unremarkable Social History:  Social History   Substance and Sexual Activity  Alcohol Use Not Currently     Social History   Substance and Sexual Activity  Drug Use Not Currently    Social History   Socioeconomic History   Marital status: Unknown    Spouse name: Not on file   Number of children: Not on file   Years of education: Not on file   Highest education level: Not on file  Occupational History   Not on file  Tobacco Use   Smoking status: Every Day    Packs/day: 0.25    Years: 15.00    Total pack years: 3.75    Types: Cigarettes   Smokeless tobacco: Not on file  Vaping Use   Vaping Use: Not on file  Substance and Sexual Activity   Alcohol use: Not Currently   Drug use: Not Currently   Sexual activity: Not Currently  Other Topics Concern   Not on file  Social History Narrative   Not on file   Social Determinants of Health   Financial Resource Strain: Not on file  Food Insecurity: No Food Insecurity (04/16/2022)   Hunger Vital Sign    Worried About Running Out of Food in the Last Year: Never true    Ran Out of Food in the Last Year: Never true  Transportation Needs: No Transportation Needs  (04/16/2022)   PRAPARE - Administrator, Civil Service (Medical): No    Lack of Transportation (Non-Medical): No  Physical Activity: Not on file  Stress: Not on file  Social Connections: Not on file   Additional Social History:                         Sleep: Good  Appetite:  Good  Current Medications: Current Facility-Administered Medications  Medication Dose Route Frequency Provider Last Rate Last Admin   acetaminophen (TYLENOL) tablet 650 mg  650 mg Oral Q6H PRN Vanetta Mulders, NP       alum & mag hydroxide-simeth (MAALOX/MYLANTA) 200-200-20 MG/5ML suspension 30 mL  30 mL Oral Q4H PRN Gabriel Cirri F, NP       cloZAPine (CLOZARIL) tablet 100 mg  100 mg Oral QHS [name], [name], MD       ziprasidone (GEODON) injection 20 mg  20 mg Intramuscular Q12H PRN Reggie Pile, MD       And   LORazepam (ATIVAN) tablet 2 mg  2 mg Oral PRN Reggie Pile, MD       magnesium hydroxide (MILK OF MAGNESIA) suspension 30 mL  30 mL Oral Daily PRN Vanetta Mulders, NP       OLANZapine zydis (ZYPREXA)  disintegrating tablet 10 mg  10 mg Oral TID PRN Reggie Pile, MD       OLANZapine zydis (ZYPREXA) disintegrating tablet 15 mg  15 mg Oral QHS [name], [name], MD       polyethylene glycol (MIRALAX / GLYCOLAX) packet 17 g  17 g Oral Daily Reggie Pile, MD       valproic acid (DEPAKENE) 250 MG/5ML solution 500 mg  500 mg Oral BID Reggie Pile, MD        Lab Results:  Results for orders placed or performed during the hospital encounter of 04/16/22 (from the past 48 hour(s))  Resp panel by RT-PCR (RSV, Flu A&B, Covid) Anterior Nasal Swab     Status: None   Collection Time: 04/18/22 11:13 AM   Specimen: Anterior Nasal Swab  Result Value Ref Range   SARS Coronavirus 2 by RT PCR NEGATIVE NEGATIVE    Comment: (NOTE) SARS-CoV-2 target nucleic acids are NOT DETECTED.  The SARS-CoV-2 RNA is generally detectable in upper respiratory specimens during the acute phase of infection.  The lowest concentration of SARS-CoV-2 viral copies this assay can detect is 138 copies/mL. A negative result does not preclude SARS-Cov-2 infection and should not be used as the sole basis for treatment or other patient management decisions. A negative result may occur with  improper specimen collection/handling, submission of specimen other than nasopharyngeal swab, presence of viral mutation(s) within the areas targeted by this assay, and inadequate number of viral copies(<138 copies/mL). A negative result must be combined with clinical observations, patient history, and epidemiological information. The expected result is Negative.  Fact Sheet for Patients:  BloggerCourse.com  Fact Sheet for Healthcare Providers:  SeriousBroker.it  This test is no t yet approved or cleared by the [place] FDA and  has been authorized for detection and/or diagnosis of SARS-CoV-2 by FDA under an Emergency Use Authorization (EUA). This EUA will remain  in effect (meaning this test can be used) for the duration of the COVID-19 declaration under Section 564(b)(1) of the Act, 21 U.S.C.section 360bbb-3(b)(1), unless the authorization is terminated  or revoked sooner.       Influenza A by PCR NEGATIVE NEGATIVE   Influenza B by PCR NEGATIVE NEGATIVE    Comment: (NOTE) The Xpert Xpress SARS-CoV-2/FLU/RSV plus assay is intended as an aid in the diagnosis of influenza from Nasopharyngeal swab specimens and should not be used as a sole basis for treatment. Nasal washings and aspirates are unacceptable for Xpert Xpress SARS-CoV-2/FLU/RSV testing.  Fact Sheet for Patients: BloggerCourse.com  Fact Sheet for Healthcare Providers: SeriousBroker.it  This test is not yet approved or cleared by the [place] FDA and has been authorized for detection and/or diagnosis of SARS-CoV-2 by FDA under an  Emergency Use Authorization (EUA). This EUA will remain in effect (meaning this test can be used) for the duration of the COVID-19 declaration under Section 564(b)(1) of the Act, 21 U.S.C. section 360bbb-3(b)(1), unless the authorization is terminated or revoked.     Resp Syncytial Virus by PCR NEGATIVE NEGATIVE    Comment: (NOTE) Fact Sheet for Patients: BloggerCourse.com  Fact Sheet for Healthcare Providers: SeriousBroker.it  This test is not yet approved or cleared by the [place] FDA and has been authorized for detection and/or diagnosis of SARS-CoV-2 by FDA under an Emergency Use Authorization (EUA). This EUA will remain in effect (meaning this test can be used) for the duration of the COVID-19 declaration under Section 564(b)(1) of the Act, 21 U.S.C. section 360bbb-3(b)(1), unless  the authorization is terminated or revoked.  Performed at [facility], [address] [place], [place] [address]     Blood Alcohol level:  Lab Results  Component Value Date   [facility] <10 04/15/2022    Metabolic Disorder Labs: No results found for: "HGBA1C", "MPG" No results found for: "PROLACTIN" No results found for: "CHOL", "TRIG", "HDL", "CHOLHDL", "VLDL", "LDLCALC"  Physical Findings: AIMS: Facial and Oral Movements Muscles of Facial Expression: None, normal Lips and Perioral Area: None, normal Jaw: None, normal Tongue: None, normal,Extremity Movements Upper (arms, wrists, hands, fingers): None, normal Lower (legs, knees, ankles, toes): None, normal, Trunk Movements Neck, shoulders, hips: None, normal, Overall Severity Severity of abnormal movements (highest score from questions above): None, normal Incapacitation due to abnormal movements: None, normal Patient's awareness of abnormal movements (rate only patient's report): No Awareness, Dental Status Current problems with teeth and/or dentures?: No Does patient  usually wear dentures?: No  CIWA:    COWS:     Musculoskeletal: Strength & Muscle Tone: within normal limits Gait & Station: normal Patient leans: N/A  Psychiatric Specialty Exam:  Presentation  General Appearance:  Disheveled  Eye Contact: Minimal  Speech: Pressured  Speech Volume: Normal  Handedness: Right   Mood and Affect  Mood: Euphoric; Irritable  Affect: Inappropriate   Thought Process  Thought Processes: Disorganized  Descriptions of Associations:Loose  Orientation:Partial  Thought Content:Illogical; Scattered  History of Schizophrenia/Schizoaffective disorder:No data recorded Duration of Psychotic Symptoms:No data recorded Hallucinations:No data recorded Ideas of Reference:Delusions; Paranoia  Suicidal Thoughts:No data recorded Homicidal Thoughts:No data recorded  Sensorium  Memory: Immediate Poor; Recent Poor; Remote Poor  Judgment: Poor  Insight: Poor   Executive Functions  Concentration: Fair  Attention Span: Fair  Recall: Poor  Fund of Knowledge: Poor  Language: Poor   Psychomotor Activity  Psychomotor Activity:No data recorded  Assets  Assets: Desire for Improvement; Social Support   Sleep  Sleep:No data recorded   Physical Exam: Physical Exam: Mood is stable and affect is very disorganized. ROS: Negative Blood pressure 115/67, pulse (!) 52, temperature 97.8 F (36.6 C), temperature source Oral, resp. rate 16, height 5\' 9"  (1.753 m), weight 96.4 kg, SpO2 99 %. Body mass index is 31.38 kg/m.   Treatment Plan Summary: Daily contact with patient to assess and evaluate symptoms and progress in treatment, Medication management, and Plan encourage medications.  Start Haldol 2 mg twice a day.  , DO 04/20/2022, 10:35 AM

## 2022-04-20 NOTE — Progress Notes (Signed)
Patient appears not interested in interacting with staff affect is blunted, he isolates to room , and he refused evening medication and stated " I don't take that l will speak with doctor" patient was offered emotional support. 15 minutes safety checks maintained will continue to monitor.

## 2022-04-20 NOTE — Plan of Care (Signed)
D: Patient alert and oriented. Patient denies pain. Patient denies anxiety and depression. Patient denies SI/HI/AVH. Patient has been isolative to room during shift with exception to coming out for meals.  A: Patient refused scheduled 0800 and 1700 medication.  Support and encouragement provided to patient.  Q15 minute safety checks maintained.   R: Patient compliant with medication administration and treatment plan. No adverse drug reactions noted. Patient remains safe on the unit at this time. Problem: Education: Goal: Knowledge of  General Education information/materials will improve Outcome: Progressing   Problem: Safety: Goal: Periods of time without injury will increase Outcome: Progressing   Problem: Health Behavior/Discharge Planning: Goal: Compliance with prescribed medication regimen will improve Outcome: Not Progressing   Problem: Role Relationship: Goal: Ability to communicate needs accurately will improve Outcome: Progressing

## 2022-04-21 DIAGNOSIS — F200 Paranoid schizophrenia: Secondary | ICD-10-CM | POA: Diagnosis not present

## 2022-04-21 MED ORDER — RISPERIDONE 1 MG PO TABS: 1.0000 mg | ORAL_TABLET | ORAL | Status: DC

## 2022-04-21 NOTE — Progress Notes (Signed)
[facility] MD Progress Note  04/21/2022 10:46 AM Travis Sandoval  MRN:  [id number] Subjective: Travis Sandoval is seen on rounds.  He has been refusing all of his medications since he was admitted.  He has been pleasantly psychotic.  I went through medications that he would be willing to take and we came up on Risperdal so we will give that one a try. Principal Problem: Schizophrenia (HCC) Diagnosis: Principal Problem:   Schizophrenia (HCC) Active Problems:   Psychosis (HCC)  Total Time spent with patient: 15 minutes  Past Psychiatric History: Schizophrenia and noncompliance.  Past Medical History:  Past Medical History:  Diagnosis Date   ADHD    Bipolar 1 disorder (HCC)    Hepatitis C    Schizoaffective disorder (HCC)    History reviewed. No pertinent surgical history. Family History:  Family History  Family history unknown: Yes   Family Psychiatric  History: Unremarkable Social History:  Social History   Substance and Sexual Activity  Alcohol Use Not Currently     Social History   Substance and Sexual Activity  Drug Use Not Currently    Social History   Socioeconomic History   Marital status: Unknown    Spouse name: Not on file   Number of children: Not on file   Years of education: Not on file   Highest education level: Not on file  Occupational History   Not on file  Tobacco Use   Smoking status: Every Day    Packs/day: 0.25    Years: 15.00    Total pack years: 3.75    Types: Cigarettes   Smokeless tobacco: Not on file  Vaping Use   Vaping Use: Not on file  Substance and Sexual Activity   Alcohol use: Not Currently   Drug use: Not Currently   Sexual activity: Not Currently  Other Topics Concern   Not on file  Social History Narrative   Not on file   Social Determinants of Health   Financial Resource Strain: Not on file  Food Insecurity: No Food Insecurity (04/16/2022)   Hunger Vital Sign    Worried About Running Out of Food in the Last Year: Never true     Ran Out of Food in the Last Year: Never true  Transportation Needs: No Transportation Needs (04/16/2022)   PRAPARE - Administrator, Civil Service (Medical): No    Lack of Transportation (Non-Medical): No  Physical Activity: Not on file  Stress: Not on file  Social Connections: Not on file   Additional Social History:                         Sleep: Good  Appetite:  Good  Current Medications: Current Facility-Administered Medications  Medication Dose Route Frequency Provider Last Rate Last Admin   acetaminophen (TYLENOL) tablet 650 mg  650 mg Oral Q6H PRN Vanetta Mulders, NP       alum & mag hydroxide-simeth (MAALOX/MYLANTA) 200-200-20 MG/5ML suspension 30 mL  30 mL Oral Q4H PRN Gabriel Cirri F, NP       cloZAPine (CLOZARIL) tablet 100 mg  100 mg Oral QHS [name], [name], MD       ziprasidone (GEODON) injection 20 mg  20 mg Intramuscular Q12H PRN Reggie Pile, MD       And   LORazepam (ATIVAN) tablet 2 mg  2 mg Oral PRN Reggie Pile, MD       magnesium hydroxide (MILK OF MAGNESIA) suspension 30  mL  30 mL Oral Daily PRN Vanetta Mulders, NP       OLANZapine zydis (ZYPREXA) disintegrating tablet 10 mg  10 mg Oral TID PRN Reggie Pile, MD       OLANZapine zydis (ZYPREXA) disintegrating tablet 15 mg  15 mg Oral QHS [name], [name], MD       polyethylene glycol (MIRALAX / GLYCOLAX) packet 17 g  17 g Oral Daily Reggie Pile, MD       risperiDONE (RISPERDAL) tablet 1 mg  1 mg Oral [id number] October Peery Edward, DO       valproic acid (DEPAKENE) 250 MG/5ML solution 500 mg  500 mg Oral BID Reggie Pile, MD        Lab Results: No results found for this or any previous visit (from the past 48 hour(s)).  Blood Alcohol level:  Lab Results  Component Value Date   ETH <10 04/15/2022    Metabolic Disorder Labs: No results found for: "HGBA1C", "MPG" No results found for: "PROLACTIN" No results found for: "CHOL", "TRIG", "HDL", "CHOLHDL", "VLDL",  "LDLCALC"  Physical Findings: AIMS: Facial and Oral Movements Muscles of Facial Expression: None, normal Lips and Perioral Area: None, normal Jaw: None, normal Tongue: None, normal,Extremity Movements Upper (arms, wrists, hands, fingers): None, normal Lower (legs, knees, ankles, toes): None, normal, Trunk Movements Neck, shoulders, hips: None, normal, Overall Severity Severity of abnormal movements (highest score from questions above): None, normal Incapacitation due to abnormal movements: None, normal Patient's awareness of abnormal movements (rate only patient's report): No Awareness, Dental Status Current problems with teeth and/or dentures?: No Does patient usually wear dentures?: No  CIWA:    COWS:     Musculoskeletal: Strength & Muscle Tone: within normal limits Gait & Station: normal Patient leans: N/A  Psychiatric Specialty Exam:  Presentation  General Appearance:  Disheveled  Eye Contact: Minimal  Speech: Pressured  Speech Volume: Normal  Handedness: Right   Mood and Affect  Mood: Euphoric; Irritable  Affect: Inappropriate   Thought Process  Thought Processes: Disorganized  Descriptions of Associations:Loose  Orientation:Partial  Thought Content:Illogical; Scattered  History of Schizophrenia/Schizoaffective disorder:No data recorded Duration of Psychotic Symptoms:No data recorded Hallucinations:No data recorded Ideas of Reference:Delusions; Paranoia  Suicidal Thoughts:No data recorded Homicidal Thoughts:No data recorded  Sensorium  Memory: Immediate Poor; Recent Poor; Remote Poor  Judgment: Poor  Insight: Poor   Executive Functions  Concentration: Fair  Attention Span: Fair  Recall: Poor  Fund of Knowledge: Poor  Language: Poor   Psychomotor Activity  Psychomotor Activity:No data recorded  Assets  Assets: Desire for Improvement; Social Support   Sleep  Sleep:No data recorded   Physical  Exam: Physical Exam pleasantly psychotic ROS negative Blood pressure 105/73, pulse 67, temperature 98.9 F (37.2 C), temperature source Oral, resp. rate 16, height 5\' 9"  (1.753 m), weight 96.4 kg, SpO2 100 %. Body mass index is 31.38 kg/m.   Treatment Plan Summary: Daily contact with patient to assess and evaluate symptoms and progress in treatment, Medication management, and Plan start Risperdal 1 mg twice a day.  Encourage medications.  , DO 04/21/2022, 10:46 AM

## 2022-04-21 NOTE — Plan of Care (Signed)
D: Patient alert and oriented. Patient denies pain. Patient denies anxiety and depression. Patient denies SI/HI/AVH. Patient remains isolative to room with exception to coming out for meals.   A: Patient refused all scheduled medication during shift. DO notified. Q15 minute safety checks maintained.   R: Patient remains safe on the unit at this time. Problem: Education: Goal: Knowledge of General Education information will improve Description: Including pain rating scale, medication(s)/side effects and non-pharmacologic comfort measures Outcome: Not Progressing   Problem: Nutrition: Goal: Adequate nutrition will be maintained Outcome: Progressing   Problem: Coping: Goal: Level of anxiety will decrease Outcome: Progressing   Problem: Education: Goal: Knowledge of [place] [facility] Education information/materials will improve Outcome: Progressing Goal: Mental status will improve Outcome: Not Progressing   Problem: Health Behavior/Discharge Planning:  Goal: Compliance with treatment plan for underlying cause of condition will improve Outcome: Not Progressing   Problem: Education: Goal: Will be free of psychotic symptoms Outcome: Not Progressing   Problem: Health Behavior/Discharge Planning: Goal: Compliance with prescribed medication regimen will improve Outcome: Not Progressing

## 2022-04-21 NOTE — Progress Notes (Signed)
No distress noted, patient alert and oriented x 4, denies pain and discomfort, he isolates to his room minimal interaction with peers and staff. Patient refused medication this evening he stated " No l don't take that " patient was offered emotional support will continue to monitor.

## 2022-04-21 NOTE — [facility] Group Notes (Signed)
[facility] Group Notes:  (Nursing/MHT/Case Management/Adjunct)  Date:  04/21/2022  Time:  8:56 PM  Type of Therapy:   Wrap up  Participation Level:  Did Not Attend  Summary of Progress/Problems:  Travis Sandoval 04/21/2022, 8:56 PM

## 2022-04-22 DIAGNOSIS — F203 Undifferentiated schizophrenia: Secondary | ICD-10-CM | POA: Diagnosis not present

## 2022-04-22 MED ORDER — OLANZAPINE 10 MG PO TBDP
10.0000 mg | ORAL_TABLET | Freq: Two times a day (BID) | ORAL | Status: DC
  Filled 2022-04-22: qty 1

## 2022-04-22 MED ORDER — OLANZAPINE 10 MG IM SOLR
10.0000 mg | Freq: Two times a day (BID) | INTRAMUSCULAR | Status: DC | PRN
  Administered 2022-04-22 – 2022-04-23 (×2): 10 mg via INTRAMUSCULAR
  Filled 2022-04-22 (×2): qty 10

## 2022-04-22 NOTE — Plan of Care (Signed)
D: Patient alert and oriented. Patient denies pain. Patient denies anxiety and depression. Patient denies SI/HI/AVH.  Patient refused all oral medication during shift. Patient stated that he preferred to take the IM route for medication and was compliant with receiving the IM Zyprexa after refusing the scheduled 1700 medication. Patient has been isolative to room during shift with exception to coming out for meals and to get vitals taken.  A: Scheduled medications administered to patient, per MD orders.  Support and encouragement provided to patient.  Q15 minute safety checks maintained.   R: Patient compliant with medication administration and treatment plan. No adverse drug reactions noted. Patient remains safe on the unit at this time. Problem: Education: Goal: Knowledge of [place] [facility] Education information/materials will improve Outcome: Progressing Goal: Emotional status will improve Outcome: Not Progressing Goal: Mental status will improve Outcome: Not Progressing Goal: Verbalization of understanding the information provided will improve Outcome: Not Progressing   Problem: Activity: Goal: Interest or engagement in activities will improve Outcome: Not Progressing   Problem: Health Behavior/Discharge Planning: Goal: Compliance with treatment plan for underlying cause of condition will improve Outcome: Not Progressing   Problem: Safety: Goal: Periods of time without injury will increase Outcome: Progressing   Problem: Education: Goal: Will be free of psychotic symptoms Outcome: Not Progressing

## 2022-04-22 NOTE — Progress Notes (Signed)
[facility] Second Physician Opinion Progress Note for Medication Administration to Non-consenting Patients (For Involuntarily Committed Patients)  Patient: Travis Sandoval Date of Birth: [id number] MRN: [id number]  Reason for the Medication: The patient, without the benefit of the specific treatment measure, is incapable of participating in any available treatment plan that will give the patient a realistic opportunity of improving the patient's condition. There is, without the benefit of the specific treatment measure, a significant possibility that the patient will harm self or others before improvement of the patient's condition is realized.  Consideration of Side Effects: Consideration of the side effects related to the medication plan has been given.  Rationale for Medication Administration: Patient is psychotic and does not have insight enough to take his medications. Without forced medicines he will clearly decompensate to the point of being a danger to himself or others.    [place], DO 04/22/22  9:35 AM   This documentation is good for (7) seven days from the date of the MD signature. New documentation must be completed every seven (7) days with detailed justification in the medical record if the patient requires continued non-emergent administration of psychotropic medications.

## 2022-04-22 NOTE — Progress Notes (Signed)
[facility] MD Progress Note  04/22/2022 12:07 PM Travis Sandoval  MRN:  [id number] Subjective: Patient seen for follow-up.  42 year old man with schizophrenia.  Patient has been refusing all medicine and even those that he had previously agreed to.  On interview today the patient informs me that he is not here as a mental patient but is here under Chiropractor by [employer].  He then goes on to say a bunch of words that make no sense whatsoever and just sound like noises he is making with his lips.  Patient refuses to believe that he is here under involuntary commitment and tells me that I must dismiss him immediately.  Will not engage in any kind of rational conversation.  Not threatening or hostile but not reasonable either. Principal Problem: Schizophrenia (HCC) Diagnosis: Principal Problem:   Schizophrenia (HCC) Active Problems:   Psychosis (HCC)  Total Time spent with patient: 30 minutes  Past Psychiatric History: Past history of schizophrenia with lengthy hospitalizations  Past Medical History:  Past Medical History:  Diagnosis Date   ADHD    Bipolar 1 disorder (HCC)    Hepatitis C    Schizoaffective disorder (HCC)    History reviewed. No pertinent surgical history. Family History:  Family History  Family history unknown: Yes   Family Psychiatric  History: See previous Social History:  Social History   Substance and Sexual Activity  Alcohol Use Not Currently     Social History   Substance and Sexual Activity  Drug Use Not Currently    Social History   Socioeconomic History   Marital status: Unknown    Spouse name: Not on file   Number of children: Not on file   Years of education: Not on file   Highest education level: Not on file  Occupational History   Not on file  Tobacco Use   Smoking status: Every Day    Packs/day: 0.25    Years: 15.00    Total pack years: 3.75    Types: Cigarettes   Smokeless tobacco: Not on file  Vaping Use   Vaping Use:  Not on file  Substance and Sexual Activity   Alcohol use: Not Currently   Drug use: Not Currently   Sexual activity: Not Currently  Other Topics Concern   Not on file  Social History Narrative   Not on file   Social Determinants of Health   Financial Resource Strain: Not on file  Food Insecurity: No Food Insecurity (04/16/2022)   Hunger Vital Sign    Worried About Running Out of Food in the Last Year: Never true    Ran Out of Food in the Last Year: Never true  Transportation Needs: No Transportation Needs (04/16/2022)   PRAPARE - Administrator, Civil Service (Medical): No    Lack of Transportation (Non-Medical): No  Physical Activity: Not on file  Stress: Not on file  Social Connections: Not on file   Additional Social History:                         Sleep: Fair  Appetite:  Fair  Current Medications: Current Facility-Administered Medications  Medication Dose Route Frequency Provider Last Rate Last Admin   acetaminophen (TYLENOL) tablet 650 mg  650 mg Oral Q6H PRN Vanetta Mulders, NP       alum & mag hydroxide-simeth (MAALOX/MYLANTA) 200-200-20 MG/5ML suspension 30 mL  30 mL Oral Q4H PRN Vanetta Mulders, NP  cloZAPine (CLOZARIL) tablet 100 mg  100 mg Oral QHS Mareena Cavan T, MD       ziprasidone (GEODON) injection 20 mg  20 mg Intramuscular Q12H PRN [name], MD       And   LORazepam (ATIVAN) tablet 2 mg  2 mg Oral PRN [name], MD       magnesium hydroxide (MILK OF MAGNESIA) suspension 30 mL  30 mL Oral Daily PRN [name], NP       OLANZapine (ZYPREXA) injection 10 mg  10 mg Intramuscular BID PRN Chinyere Galiano, [name], MD       OLANZapine zydis (ZYPREXA) disintegrating tablet 10 mg  10 mg Oral TID PRN Parks Ranger, DO       OLANZapine zydis (ZYPREXA) disintegrating tablet 10 mg  10 mg Oral BID Chantil Bari, [name], MD       polyethylene glycol (MIRALAX / GLYCOLAX) packet 17 g  17 g Oral Daily [name], MD        valproic acid (DEPAKENE) 250 MG/5ML solution 500 mg  500 mg Oral BID [name], MD        Lab Results: No results found for this or any previous visit (from the past 63 hour(s)).  Blood Alcohol level:  Lab Results  Component Value Date   ETH <10 16/01/9603    Metabolic Disorder Labs: No results found for: "HGBA1C", "MPG" No results found for: "PROLACTIN" No results found for: "CHOL", "TRIG", "HDL", "CHOLHDL", "VLDL", "LDLCALC"  Physical Findings: AIMS: Facial and Oral Movements Muscles of Facial Expression: None, normal Lips and Perioral Area: None, normal Jaw: None, normal Tongue: None, normal,Extremity Movements Upper (arms, wrists, hands, fingers): None, normal Lower (legs, knees, ankles, toes): None, normal, Trunk Movements Neck, shoulders, hips: None, normal, Overall Severity Severity of abnormal movements (highest score from questions above): None, normal Incapacitation due to abnormal movements: None, normal Patient's awareness of abnormal movements (rate only patient's report): No Awareness, Dental Status Current problems with teeth and/or dentures?: No Does patient usually wear dentures?: No  CIWA:    COWS:     Musculoskeletal: Strength & Muscle Tone: within normal limits Gait & Station: normal Patient leans: N/A  Psychiatric Specialty Exam:  Presentation  General Appearance:  Disheveled  Eye Contact: Minimal  Speech: Pressured  Speech Volume: Normal  Handedness: Right   Mood and Affect  Mood: Euphoric; Irritable  Affect: Inappropriate   Thought Process  Thought Processes: Disorganized  Descriptions of Associations:Loose  Orientation:Partial  Thought Content:Illogical; Scattered  History of Schizophrenia/Schizoaffective disorder:No data recorded Duration of Psychotic Symptoms:No data recorded Hallucinations:No data recorded Ideas of Reference:Delusions; Paranoia  Suicidal Thoughts:No data recorded Homicidal Thoughts:No data  recorded  Sensorium  Memory: Immediate Poor; Recent Poor; Remote Poor  Judgment: Poor  Insight: Poor   Executive Functions  Concentration: Fair  Attention Span: Fair  Recall: Poor  Fund of Knowledge: Poor  Language: Poor   Psychomotor Activity  Psychomotor Activity:No data recorded  Assets  Assets: Desire for Improvement; Social Support   Sleep  Sleep:No data recorded   Physical Exam: Physical Exam Vitals and nursing note reviewed.  Constitutional:      Appearance: Normal appearance.  HENT:     Head: Normocephalic and atraumatic.     Mouth/Throat:     Pharynx: Oropharynx is clear.  Eyes:     Pupils: Pupils are equal, round, and reactive to light.  Cardiovascular:     Rate and Rhythm: Normal rate and regular rhythm.  Pulmonary:  Effort: Pulmonary effort is normal.     Breath sounds: Normal breath sounds.  Abdominal:     General: Abdomen is flat.     Palpations: Abdomen is soft.  Musculoskeletal:        General: Normal range of motion.  Skin:    General: Skin is warm and dry.  Neurological:     General: No focal deficit present.     Mental Status: He is alert. Mental status is at baseline.  Psychiatric:        Attention and Perception: He is inattentive.        Mood and Affect: Mood normal. Affect is blunt and inappropriate.        Speech: Speech normal.        Behavior: Behavior is uncooperative and agitated.        Thought Content: Thought content is delusional.        Cognition and Memory: Memory is impaired.        Judgment: Judgment is inappropriate.    Review of Systems  Constitutional: Negative.   HENT: Negative.    Eyes: Negative.   Respiratory: Negative.    Cardiovascular: Negative.   Gastrointestinal: Negative.   Musculoskeletal: Negative.   Skin: Negative.   Neurological: Negative.   Psychiatric/Behavioral: Negative.     Blood pressure 121/61, pulse 66, temperature 98.3 F (36.8 C), temperature source Oral, resp.  rate 16, height 5\' 9"  (1.753 m), weight 96.4 kg, SpO2 99 %. Body mass index is 31.38 kg/m.   Treatment Plan Summary: Medication management and Plan patient is obviously psychotic paranoid disorganized and not able to engage in any kind of conversation that would allow him to progress towards release from the hospital.  Also irritable and at times seems potentially at risk of aggression.  Based on this he meets criteria for forced medication.  I have requested Dr. Louis Meckel write a second note if he agrees.  Forced medicine orders completed to give him oral dissolvable olanzapine with backup IM if refused.  Alethia Berthold, MD 04/22/2022, 12:07 PM

## 2022-04-22 NOTE — Progress Notes (Signed)
No distress noted, patient alert and oriented x 4, denies pain and discomfort, he isolates to his room minimal interaction with peers and staff. Patient refused medication, he was offered emotional support and encouragement, will continue to monitor.

## 2022-04-22 NOTE — BH IP Treatment Plan (Signed)
Interdisciplinary Treatment and Diagnostic Plan Update  04/22/2022 Time of Session: 0830 Travis Sandoval MRN: [id number]  Principal Diagnosis: Schizophrenia [facility])  Secondary Diagnoses: Principal Problem:   Schizophrenia ([place]) Active Problems:   Psychosis ([place])   Current Medications:  Current Facility-Administered Medications  Medication Dose Route Frequency Provider Last Rate Last Admin   acetaminophen (TYLENOL) tablet 650 mg  650 mg Oral Q6H PRN [name], NP       alum & mag hydroxide-simeth (MAALOX/MYLANTA) 200-200-20 MG/5ML suspension 30 mL  30 mL Oral Q4H PRN [name], NP       cloZAPine (CLOZARIL) tablet 100 mg  100 mg Oral QHS [name], [name], MD       ziprasidone (GEODON) injection 20 mg  20 mg Intramuscular Q12H PRN [name], MD       And   LORazepam (ATIVAN) tablet 2 mg  2 mg Oral PRN [name], MD       magnesium hydroxide (MILK OF MAGNESIA) suspension 30 mL  30 mL Oral Daily PRN [name], NP       OLANZapine zydis (ZYPREXA) disintegrating tablet 10 mg  10 mg Oral TID PRN Parks Ranger, DO       OLANZapine zydis (ZYPREXA) disintegrating tablet 15 mg  15 mg Oral QHS [name], [name], MD       polyethylene glycol (MIRALAX / GLYCOLAX) packet 17 g  17 g Oral Daily [name], MD       risperiDONE (RISPERDAL) tablet 1 mg  1 mg Oral [id number] Herrick, Richard Edward, DO       valproic acid (DEPAKENE) 250 MG/5ML solution 500 mg  500 mg Oral BID [name], MD       PTA Medications: Medications Prior to Admission  Medication Sig Dispense Refill Last Dose   ABILIFY MAINTENA 400 MG SRER injection Inject 400 mg into the muscle every 28 (twenty-eight) days.      ARIPiprazole (ABILIFY) 10 MG tablet Take 10 mg by mouth daily. (Patient not taking: Reported on 04/16/2022)      atomoxetine (STRATTERA) 40 MG capsule Take 40 mg by mouth daily. (Patient not taking: Reported on 04/16/2022)      atorvastatin (LIPITOR) 40 MG tablet Take 40 mg by  mouth daily.      budesonide-formoterol (SYMBICORT) 160-4.5 MCG/ACT inhaler Inhale 2 puffs into the lungs.      cetirizine (ZYRTEC) 10 MG tablet Take 10 mg by mouth daily.      clozapine (CLOZARIL) 200 MG tablet Take 200 mg by mouth 2 (two) times daily. Take along with one 25 mg tablet for total 225 mg twice daily      cloZAPine (CLOZARIL) 25 MG tablet Take 25 mg by mouth 2 (two) times daily. Take along with one 200 mg tablet for total 225 mg twice daily      Dexlansoprazole (DEXILANT) 30 MG capsule Take 30 mg by mouth daily. (Patient not taking: Reported on 04/16/2022)      hydrOXYzine (VISTARIL) 50 MG capsule Take 50 mg by mouth 3 (three) times daily as needed for itching. (Patient not taking: Reported on 04/16/2022)      Melatonin 5 MG TABS Take 5 mg by mouth at bedtime as needed (sleep).      metoprolol succinate (TOPROL-XL) 25 MG 24 hr tablet Take 12.5 mg by mouth daily.      Phosphatidyl Choline 65 MG TABS Take 1 tablet by mouth daily.  (Patient not taking: Reported on 04/16/2022)  valproic acid (DEPAKENE) 250 MG/5ML solution Take 750-1,000 mLs by mouth 2 (two) times daily. 1000 mg (20 mL) every morning and 750 mg (15 mL) daily at bedtime       Patient Stressors:    Patient Strengths:    Treatment Modalities: Medication Management, Group therapy, Case management,  1 to 1 session with clinician, Psychoeducation, Recreational therapy.   Physician Treatment Plan for Primary Diagnosis: Schizophrenia ([place]) Long Term Goal(s):     Short Term Goals: None, pt is a poor hx.  Medication Management: Evaluate patient's response, side effects, and tolerance of medication regimen.  Therapeutic Interventions: 1 to 1 sessions, Unit Group sessions and Medication administration.  Evaluation of Outcomes: Not Progressing  Physician Treatment Plan for Secondary Diagnosis: Principal Problem:   Schizophrenia ([place]) Active Problems:   Psychosis ([place])  Long Term Goal(s):     Short Term Goals:  None, pt is a poor hx.     Medication Management: Evaluate patient's response, side effects, and tolerance of medication regimen.  Therapeutic Interventions: 1 to 1 sessions, Unit Group sessions and Medication administration.  Evaluation of Outcomes: Not Progressing   RN Treatment Plan for Primary Diagnosis: Schizophrenia ([place]) Long Term Goal(s): Knowledge of disease and therapeutic regimen to maintain health will improve  Short Term Goals: Ability to remain free from injury will improve, Ability to verbalize frustration and anger appropriately will improve, Ability to demonstrate self-control, Ability to participate in decision making will improve, Ability to verbalize feelings will improve, Ability to disclose and discuss suicidal ideas, Ability to identify and develop effective coping behaviors will improve, and Compliance with prescribed medications will improve  Medication Management: RN will administer medications as ordered by provider, will assess and evaluate patient's response and provide education to patient for prescribed medication. RN will report any adverse and/or side effects to prescribing provider.  Therapeutic Interventions: 1 on 1 counseling sessions, Psychoeducation, Medication administration, Evaluate responses to treatment, Monitor vital signs and CBGs as ordered, Perform/monitor CIWA, COWS, AIMS and Fall Risk screenings as ordered, Perform wound care treatments as ordered.  Evaluation of Outcomes: Not Progressing   LCSW Treatment Plan for Primary Diagnosis: Schizophrenia ([place]) Long Term Goal(s): Safe transition to appropriate next level of care at discharge, Engage patient in therapeutic group addressing interpersonal concerns.  Short Term Goals: Engage patient in aftercare planning with referrals and resources, Increase social support, Increase ability to appropriately verbalize feelings, Increase emotional regulation, Facilitate acceptance of mental health diagnosis  and concerns, Facilitate patient progression through stages of change regarding substance use diagnoses and concerns, Identify triggers associated with mental health/substance abuse issues, and Increase skills for wellness and recovery  Therapeutic Interventions: Assess for all discharge needs, 1 to 1 time with Social worker, Explore available resources and support systems, Assess for adequacy in community support network, Educate family and significant other(s) on suicide prevention, Complete Psychosocial Assessment, Interpersonal group therapy.  Evaluation of Outcomes: Not Progressing   Progress in Treatment: Attending groups: No. Participating in groups: No. Taking medication as prescribed: No. Toleration medication: No. Family/Significant other contact made: No, will contact:  declined consent to reach family/friend  Patient understands diagnosis: No. Discussing patient identified problems/goals with staff: No. Medical problems stabilized or resolved: Yes. Denies suicidal/homicidal ideation: No. Issues/concerns per patient self-inventory: No. Other: none  New problem(s) identified: No, Describe:  none  New Short Term/Long Term Goal(s): Patient to work towards elimination of symptoms of psychosis, medication management for mood stabilization; elimination of SI thoughts; development of comprehensive mental wellness  plan.  Patient Goals:  No additional goals identified at this time. Patient to continue to work towards original goals identified in initial treatment team meeting. CSW will remain available to patient should they voice additional treatment goals.   Discharge Plan or Barriers: No psychosocial barriers identified at this time, patient to return to place of residence when appropriate for discharge.   Reason for Continuation of Hospitalization: Medication stabilization Other; describe psychosis   Estimated Length of Stay: 1-7 days   Scribe for Treatment Team: Larose Kells 04/22/2022 11:42 AM

## 2022-04-23 DIAGNOSIS — F203 Undifferentiated schizophrenia: Secondary | ICD-10-CM | POA: Diagnosis not present

## 2022-04-23 MED ORDER — OLANZAPINE 10 MG IM SOLR
10.0000 mg | Freq: Two times a day (BID) | INTRAMUSCULAR | Status: DC
  Administered 2022-04-23 – 2022-04-30 (×13): 10 mg via INTRAMUSCULAR
  Filled 2022-04-23 (×13): qty 10

## 2022-04-23 MED ORDER — NICOTINE POLACRILEX 2 MG MT GUM
2.0000 mg | CHEWING_GUM | OROMUCOSAL | Status: DC | PRN
  Administered 2022-04-23: 2 mg via ORAL

## 2022-04-23 NOTE — Progress Notes (Signed)
[facility] MD Progress Note  04/23/2022 12:38 PM Travis Sandoval  MRN:  [id number] Subjective: Follow-up patient with schizophrenia.  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  Because of his persistent psychosis and lack of insight orders were started yesterday for forced medication.  Patient then voluntarily chose to take intramuscular doses and has told us that this is his preferred route.  I confirmed that with him today and he told me he would prefer to simply take the injections.  Continues to make delusional statements without any hostility or threatening behavior mostly stays in bed. Principal Problem: Schizophrenia (HCC) Diagnosis: Principal Problem:   Schizophrenia (H[facility]) Active Problems:   Psychosis (H[facility])  Total Time spent with patient: 30 minutes  Past Psychiatric History: Past history of schizophrenia  Past Medical History:  Past Medical History:  Diagnosis Date   ADHD    Bipolar 1 disorder (HCC)    Hepatitis C    Schizoaffective disorder (HCC)    History reviewed. No pertinent surgical history. Family History:  Family History  Family history unknown: Yes   Family Psychiatric  History: See previous Social History:  Social History   Substance and Sexual Activity  Alcohol Use Not Currently     Social History   Substance and Sexual Activity  Drug Use Not Currently    Social History   Socioeconomic History   Marital status: Unknown    Spouse name: Not on file   Number of children: Not on file   Years of education: Not on file   Highest education level: Not on file  Occupational History   Not on file  Tobacco Use   Smoking status: Every Day    Packs/day: 0.25    Years: 15.00    Total pack years: 3.75    Types: Cigarettes   Smokeless tobacco: Not on file  Vaping Use   Vaping Use: Not on file  Substance and Sexual Activity   Alcohol use: Not Currently   Drug  use: Not Currently   Sexual activity: Not Currently  Other Topics Concern   Not on file  Social History Narrative   Not on file   Social Determinants of Health   Financial Resource Strain: Not on file  Food Insecurity: No Food Insecurity (04/16/2022)   Hunger Vital Sign    Worried About Running Out of Food in the Last Year: Never true    Ran Out of Food in the Last Year: Never true  Transportation Needs: No Transportation Needs (04/16/2022)   PRAPARE - Administrator, Civil Service (Medical): No    Lack of Transportation (Non-Medical): No  Physical Activity: Not on file  Stress: Not on file  Social Connections: Not on file   Additional Social History:                         Sleep: Fair  Appetite:  Fair  Current Medications: Current Facility-Administered Medications  Medication Dose Route Frequency Provider Last Rate Last Admin   acetaminophen (TYLENOL) tablet 650 mg  650 mg Oral Q6H PRN Vanetta Mulders, NP       alum & mag hydroxide-simeth (MAALOX/MYLANTA) 200-200-20 MG/5ML suspension 30 mL  30 mL Oral Q4H PRN Gabriel Cirri F, NP       cloZAPine (CLOZARIL) tablet 100 mg  100 mg Oral QHS Sydnie Sigmund, [name], MD   100 mg  at 04/22/22 2134   ziprasidone (GEODON) injection 20 mg  20 mg Intramuscular Q12H PRN [name], MD       And   LORazepam (ATIVAN) tablet 2 mg  2 mg Oral PRN [name], MD       magnesium hydroxide (MILK OF MAGNESIA) suspension 30 mL  30 mL Oral Daily PRN [name], NP       OLANZapine (ZYPREXA) injection 10 mg  10 mg Intramuscular BID Evalyse Stroope, [name], MD       polyethylene glycol (MIRALAX / GLYCOLAX) packet 17 g  17 g Oral Daily [name], MD       valproic acid (DEPAKENE) 250 MG/5ML solution 500 mg  500 mg Oral BID [name], MD        Lab Results: No results found for this or any previous visit (from the past 91 hour(s)).  Blood Alcohol level:  Lab Results  Component Value Date   ETH <10 28/41/3244     Metabolic Disorder Labs: No results found for: "HGBA1C", "MPG" No results found for: "PROLACTIN" No results found for: "CHOL", "TRIG", "HDL", "CHOLHDL", "VLDL", "LDLCALC"  Physical Findings: AIMS: Facial and Oral Movements Muscles of Facial Expression: None, normal Lips and Perioral Area: None, normal Jaw: None, normal Tongue: None, normal,Extremity Movements Upper (arms, wrists, hands, fingers): None, normal Lower (legs, knees, ankles, toes): None, normal, Trunk Movements Neck, shoulders, hips: None, normal, Overall Severity Severity of abnormal movements (highest score from questions above): None, normal Incapacitation due to abnormal movements: None, normal Patient's awareness of abnormal movements (rate only patient's report): No Awareness, Dental Status Current problems with teeth and/or dentures?: No Does patient usually wear dentures?: No  CIWA:    COWS:     Musculoskeletal: Strength & Muscle Tone: within normal limits Gait & Station: normal Patient leans: N/A  Psychiatric Specialty Exam:  Presentation  General Appearance:  Disheveled  Eye Contact: Minimal  Speech: Pressured  Speech Volume: Normal  Handedness: Right   Mood and Affect  Mood: Euphoric; Irritable  Affect: Inappropriate   Thought Process  Thought Processes: Disorganized  Descriptions of Associations:Loose  Orientation:Partial  Thought Content:Illogical; Scattered  History of Schizophrenia/Schizoaffective disorder:No data recorded Duration of Psychotic Symptoms:No data recorded Hallucinations:No data recorded Ideas of Reference:Delusions; Paranoia  Suicidal Thoughts:No data recorded Homicidal Thoughts:No data recorded  Sensorium  Memory: Immediate Poor; Recent Poor; Remote Poor  Judgment: Poor  Insight: Poor   Executive Functions  Concentration: Fair  Attention Span: Fair  Recall: Poor  Fund of Knowledge: Poor  Language: Poor   Psychomotor  Activity  Psychomotor Activity:No data recorded  Assets  Assets: Desire for Improvement; Social Support   Sleep  Sleep:No data recorded   Physical Exam: Physical Exam Vitals and nursing note reviewed.  Constitutional:      Appearance: Normal appearance.  HENT:     Head: Normocephalic and atraumatic.     Mouth/Throat:     Pharynx: Oropharynx is clear.  Eyes:     Pupils: Pupils are equal, round, and reactive to light.  Cardiovascular:     Rate and Rhythm: Normal rate and regular rhythm.  Pulmonary:     Effort: Pulmonary effort is normal.     Breath sounds: Normal breath sounds.  Abdominal:     General: Abdomen is flat.     Palpations: Abdomen is soft.  Musculoskeletal:        General: Normal range of motion.  Skin:    General: Skin is warm and dry.  Neurological:     General: No focal deficit present.     Mental Status: He is alert. Mental status is at baseline.  Psychiatric:        Attention and Perception: Attention normal.        Mood and Affect: Mood normal. Affect is blunt.        Speech: Speech normal.        Behavior: Behavior is withdrawn.        Thought Content: Thought content is delusional.    Review of Systems  Constitutional: Negative.   HENT: Negative.    Eyes: Negative.   Respiratory: Negative.    Cardiovascular: Negative.   Gastrointestinal: Negative.   Musculoskeletal: Negative.   Skin: Negative.   Neurological: Negative.   Psychiatric/Behavioral: Negative.     Blood pressure 126/69, pulse 75, temperature 97.9 F (36.6 C), temperature source Oral, resp. rate 20, height 5\' 9"  (1.753 m), weight 96.4 kg, SpO2 98 %. Body mass index is 31.38 kg/m.   Treatment Plan Summary: Medication management and Plan order was changed to just make the olanzapine intramuscular 10 mg twice a day.  Daily monitoring of psychotic symptoms.  Encourage group attendance.  Continue monitoring for any physical difficulty.  Alethia Berthold, MD 04/23/2022, 12:38 PM

## 2022-04-23 NOTE — Progress Notes (Signed)
Recreation Therapy Notes    Date: 04/23/2022  Time: 10:00 am  Location: Craft room     Behavioral response: N/A   Intervention Topic: Teamwork    Discussion/Intervention: Patient refused to attend group.   Clinical Observations/Feedback:  Patient refused to attend group.    Vonzella Althaus LRT/CTRS       Rache Klimaszewski 04/23/2022 10:50 AM 

## 2022-04-23 NOTE — Plan of Care (Signed)
D: Patient alert and oriented. Patient denies pain. Patient denies anxiety and depression. Patient denies SI/HI/AVH. Patient refuses all oral medication. Patient states that IM medication is his preferred method of taking medication. Patient has been more willing to take medication since it has been changed to IM. Patient came to the medication window asking when he gets medication next and is what the schedule will be fore tomorrow for his medication.   A: Scheduled IM medications administered to patient, per MD orders.  Support and encouragement provided to patient.  Q15 minute safety checks maintained.   R: Patient compliant with IM medication administration and treatment plan. No adverse drug reactions noted. Patient remains safe on the unit at this time. Problem: Education: Goal: Knowledge of  General Education information/materials will improve Outcome: Not Progressing Goal: Mental status will improve Outcome: Not Progressing   Problem: Health Behavior/Discharge Planning: Goal: Compliance with treatment plan for underlying cause of condition will improve Outcome: Not Progressing   Problem: Safety: Goal: Periods of time without injury will increase Outcome: Progressing   Problem: Education: Goal: Will be free of psychotic symptoms Outcome: Not Progressing Goal: Knowledge of the prescribed therapeutic regimen will improve Outcome: Progressing   Problem: Health Behavior/Discharge Planning: Goal: Compliance with prescribed medication regimen will improve Outcome: Not Progressing

## 2022-04-23 NOTE — Progress Notes (Signed)
   04/23/22 0600  Psych Admission Type (Psych Patients Only)  Admission Status Involuntary  Psychosocial Assessment  Patient Complaints None  Eye Contact Fair  Facial Expression Masked  Affect Apprehensive  Speech Logical/coherent  Interaction No initiation  Motor Activity Slow  Appearance/Hygiene Improved  Behavior Characteristics Unwilling to participate  Mood Preoccupied  Thought Process  Coherency Circumstantial  Content WDL  Delusions None reported or observed  Perception WDL  Hallucination None reported or observed  Judgment Impaired  Confusion None  Danger to Self  Current suicidal ideation? Denies  Danger to Others  Danger to Others None reported or observed

## 2022-04-23 NOTE — [facility] Group Notes (Signed)
[place] Group Notes:  (Nursing/MHT/Case Management/Adjunct)  Date:  04/23/2022  Time:  9:23 AM  Type of Therapy:   Community Meeting  Participation Level:  Did Not Attend   Adela Lank [facility] 04/23/2022, 9:23 AM

## 2022-04-23 NOTE — Group Note (Signed)
[facility] LCSW Group Therapy Note    Group Date: 04/23/2022 Start Time: 1300 End Time: 1400  Type of Therapy and Topic:  Group Therapy:  Overcoming Obstacles  Participation Level:  [facility] PARTICIPATION LEVEL: Did Not Attend  Mood:  Description of Group:   In this group patients will be encouraged to explore what they see as obstacles to their own wellness and recovery. They will be guided to discuss their thoughts, feelings, and behaviors related to these obstacles. The group will process together ways to cope with barriers, with attention given to specific choices patients can make. Each patient will be challenged to identify changes they are motivated to make in order to overcome their obstacles. This group will be process-oriented, with patients participating in exploration of their own experiences as well as giving and receiving support and challenge from other group members.  Therapeutic Goals: 1. Patient will identify personal and current obstacles as they relate to admission. 2. Patient will identify barriers that currently interfere with their wellness or overcoming obstacles.  3. Patient will identify feelings, thought process and behaviors related to these barriers. 4. Patient will identify two changes they are willing to make to overcome these obstacles:    Summary of Patient Progress Patient did not attend group, though encouraged to do so by this clinician.    Therapeutic Modalities:   Cognitive Behavioral Therapy Solution Focused Therapy Motivational Interviewing Relapse Prevention Therapy   Keyshawn Hellwig J Iviana Blasingame, LCSW 

## 2022-04-24 DIAGNOSIS — F203 Undifferentiated schizophrenia: Secondary | ICD-10-CM | POA: Diagnosis not present

## 2022-04-24 LAB — CBC WITH DIFFERENTIAL/PLATELET
Abs Immature Granulocytes: 0.01 10*3/uL (ref 0.00–0.07)
Basophils Absolute: 0 10*3/uL (ref 0.0–0.1)
Basophils Relative: 0 %
Eosinophils Absolute: 0.1 10*3/uL (ref 0.0–0.5)
Eosinophils Relative: 1 %
HCT: 43.7 % (ref 39.0–52.0)
Hemoglobin: 14.8 g/dL (ref 13.0–17.0)
Immature Granulocytes: 0 %
Lymphocytes Relative: 36 %
Lymphs Abs: 2.5 10*3/uL (ref 0.7–4.0)
MCH: 31.4 pg (ref 26.0–34.0)
MCHC: 33.9 g/dL (ref 30.0–36.0)
MCV: 92.6 fL (ref 80.0–100.0)
Monocytes Absolute: 0.9 10*3/uL (ref 0.1–1.0)
Monocytes Relative: 12 %
Neutro Abs: 3.5 10*3/uL (ref 1.7–7.7)
Neutrophils Relative %: 51 %
Platelets: 157 10*3/uL (ref 150–400)
RBC: 4.72 MIL/uL (ref 4.22–5.81)
RDW: 12.7 % (ref 11.5–15.5)
WBC: 6.9 10*3/uL (ref 4.0–10.5)
nRBC: 0 % (ref 0.0–0.2)

## 2022-04-24 LAB — HEMOGLOBIN A1C
Hgb A1c MFr Bld: 5.5 % (ref 4.8–5.6)
Mean Plasma Glucose: 111 mg/dL

## 2022-04-24 LAB — LIPID PANEL
Cholesterol: 220 mg/dL — ABNORMAL HIGH (ref 0–200)
HDL: 35 mg/dL — ABNORMAL LOW (ref >40)
LDL Cholesterol: UNDETERMINED mg/dL (ref 0–99)
Total CHOL/HDL Ratio: 6.3 RATIO
Triglycerides: 700 mg/dL — ABNORMAL HIGH (ref <150)
VLDL: UNDETERMINED mg/dL (ref 0–40)

## 2022-04-24 LAB — LDL CHOLESTEROL, DIRECT: Direct LDL: 101 mg/dL — ABNORMAL HIGH (ref 0–99)

## 2022-04-24 NOTE — Consult Note (Signed)
Pharmacy Consult - Clozapine     42 yo male ordered clozapine 100 mg PO daily  This patient's order has been reviewed for prescribing contraindications.    Clozapine [facility] enrollment Verified: yes on 04/17/22 [facility] patient ID: [id number] Current Outpatient Monitoring: Every 2 weeks   Home Regimen:  225 mg po BID Last dose: unknown   Dose Adjustments This Admission: Dose started at clozapine 100 mg po daily   Labs: Date    ANC    Submitted? 12/27 2800 Yes 01/03 3500 Yes     Plan: Continue clozapine 100 mg po daily Monitor ANC at least weekly while inpatient     Aubery Lapping, PharmD 04/24/22

## 2022-04-24 NOTE — Progress Notes (Signed)
Travis Sandoval Progress Note  04/24/2022 11:29 AM Travis Sandoval  MRN:  [id number] Subjective: Patient seen for follow-up.  42 year old man with schizophrenia.  Stays in his room most of the time.  He is taking olanzapine as IM injections now.  In conversation today he was somewhat withdrawn.  Did not make any obviously psychotic statements but also seems to have some thought blocking and still some unclear thinking. Principal Problem: Schizophrenia ([place]) Diagnosis: Principal Problem:   Schizophrenia ([place]) Active Problems:   Psychosis ([place])  Total Time spent with patient: 30 minutes  Past Psychiatric History: Past history of schizophrenia  Past Medical History:  Past Medical History:  Diagnosis Date   ADHD    Bipolar 1 disorder ([place])    Hepatitis C    Schizoaffective disorder ([place])    History reviewed. No pertinent surgical history. Family History:  Family History  Family history unknown: Yes   Family Psychiatric  History: See previous Social History:  Social History   Substance and Sexual Activity  Alcohol Use Not Currently     Social History   Substance and Sexual Activity  Drug Use Not Currently    Social History   Socioeconomic History   Marital status: Unknown    Spouse name: Not on file   Number of children: Not on file   Years of education: Not on file   Highest education level: Not on file  Occupational History   Not on file  Tobacco Use   Smoking status: Every Day    Packs/day: 0.25    Years: 15.00    Total pack years: 3.75    Types: Cigarettes   Smokeless tobacco: Not on file  Vaping Use   Vaping Use: Not on file  Substance and Sexual Activity   Alcohol use: Not Currently   Drug use: Not Currently   Sexual activity: Not Currently  Other Topics Concern   Not on file  Social History Narrative   Not on file   Social Determinants of Health   Financial Resource Strain: Not on file  Food Insecurity: No Food Insecurity (04/16/2022)   Hunger Vital Sign     Worried About Running Out of Food in the Last Year: Never true    Ran Out of Food in the Last Year: Never true  Transportation Needs: No Transportation Needs (04/16/2022)   PRAPARE - Hydrologist (Medical): No    Lack of Transportation (Non-Medical): No  Physical Activity: Not on file  Stress: Not on file  Social Connections: Not on file   Additional Social History:                         Sleep: Fair  Appetite:  Fair  Current Medications: Current Facility-Administered Medications  Medication Dose Route Frequency Provider Last Rate Last Admin   acetaminophen (TYLENOL) tablet 650 mg  650 mg Oral Q6H PRN [name], NP       alum & mag hydroxide-simeth (MAALOX/MYLANTA) 200-200-20 MG/5ML suspension 30 mL  30 mL Oral Q4H PRN [name], NP       cloZAPine (CLOZARIL) tablet 100 mg  100 mg Oral QHS Leanndra Pember T, Sandoval   100 mg at 04/23/22 2141   ziprasidone (GEODON) injection 20 mg  20 mg Intramuscular Q12H PRN [name], Sandoval       And   LORazepam (ATIVAN) tablet 2 mg  2 mg Oral PRN [name], Sandoval  magnesium hydroxide (MILK OF MAGNESIA) suspension 30 mL  30 mL Oral Daily PRN Gabriel Cirri F, NP       nicotine polacrilex (NICORETTE) gum 2 mg  2 mg Oral PRN Chemere Steffler T, Sandoval   2 mg at 04/23/22 2200   OLANZapine (ZYPREXA) injection 10 mg  10 mg Intramuscular BID Jaslynn Thome T, Sandoval   10 mg at 04/24/22 0829   polyethylene glycol (MIRALAX / GLYCOLAX) packet 17 g  17 g Oral Daily Reggie Pile, Sandoval       valproic acid (DEPAKENE) 250 MG/5ML solution 500 mg  500 mg Oral BID Reggie Pile, Sandoval        Lab Results: No results found for this or any previous visit (from the past 48 hour(s)).  Blood Alcohol level:  Lab Results  Component Value Date   ETH <10 04/15/2022    Metabolic Disorder Labs: No results found for: "HGBA1C", "MPG" No results found for: "PROLACTIN" No results found for: "CHOL", "TRIG", "HDL", "CHOLHDL",  "VLDL", "LDLCALC"  Physical Findings: AIMS: Facial and Oral Movements Muscles of Facial Expression: None, normal Lips and Perioral Area: None, normal Jaw: None, normal Tongue: None, normal,Extremity Movements Upper (arms, wrists, hands, fingers): None, normal Lower (legs, knees, ankles, toes): None, normal, Trunk Movements Neck, shoulders, hips: None, normal, Overall Severity Severity of abnormal movements (highest score from questions above): None, normal Incapacitation due to abnormal movements: None, normal Patient's awareness of abnormal movements (rate only patient's report): No Awareness, Dental Status Current problems with teeth and/or dentures?: No Does patient usually wear dentures?: No  CIWA:    COWS:     Musculoskeletal: Strength & Muscle Tone: within normal limits Gait & Station: normal Patient leans: N/A  Psychiatric Specialty Exam:  Presentation  General Appearance:  Disheveled  Eye Contact: Minimal  Speech: Pressured  Speech Volume: Normal  Handedness: Right   Mood and Affect  Mood: Euphoric; Irritable  Affect: Inappropriate   Thought Process  Thought Processes: Disorganized  Descriptions of Associations:Loose  Orientation:Partial  Thought Content:Illogical; Scattered  History of Schizophrenia/Schizoaffective disorder:No data recorded Duration of Psychotic Symptoms:No data recorded Hallucinations:No data recorded Ideas of Reference:Delusions; Paranoia  Suicidal Thoughts:No data recorded Homicidal Thoughts:No data recorded  Sensorium  Memory: Immediate Poor; Recent Poor; Remote Poor  Judgment: Poor  Insight: Poor   Executive Functions  Concentration: Fair  Attention Span: Fair  Recall: Poor  Fund of Knowledge: Poor  Language: Poor   Psychomotor Activity  Psychomotor Activity:No data recorded  Assets  Assets: Desire for Improvement; Social Support   Sleep  Sleep:No data recorded   Physical  Exam: Physical Exam Vitals and nursing note reviewed.  Constitutional:      Appearance: Normal appearance.  HENT:     Head: Normocephalic and atraumatic.     Mouth/Throat:     Pharynx: Oropharynx is clear.  Eyes:     Pupils: Pupils are equal, round, and reactive to light.  Cardiovascular:     Rate and Rhythm: Normal rate and regular rhythm.  Pulmonary:     Effort: Pulmonary effort is normal.     Breath sounds: Normal breath sounds.  Abdominal:     General: Abdomen is flat.     Palpations: Abdomen is soft.  Musculoskeletal:        General: Normal range of motion.  Skin:    General: Skin is warm and dry.  Neurological:     General: No focal deficit present.     Mental Status: He is alert. Mental  status is at baseline.  Psychiatric:        Attention and Perception: He is inattentive.        Mood and Affect: Mood normal. Affect is blunt.        Speech: Speech is tangential.        Behavior: Behavior is withdrawn.    Review of Systems  Constitutional: Negative.   HENT: Negative.    Eyes: Negative.   Respiratory: Negative.    Cardiovascular: Negative.   Gastrointestinal: Negative.   Musculoskeletal: Negative.   Skin: Negative.   Neurological: Negative.   Psychiatric/Behavioral: Negative.     Blood pressure 126/69, pulse 75, temperature 97.9 F (36.6 C), temperature source Oral, resp. rate 20, height 5\' 9"  (1.753 m), weight 96.4 kg, SpO2 98 %. Body mass index is 31.38 kg/m.   Treatment Plan Summary: Medication management and Plan I asked the patient if he would please consider taking the clozapine again.  He needs to have something that he will take orally if we are going to be able to discharge him from the hospital.  Patient shrugged and said that he might as well but he would like to continue the IM olanzapine for now as well.  Orders still in place to give the Clozapine tonight.  Encourage group attendance and being out of bed.  Alethia Berthold, Sandoval 04/24/2022, 11:29  AM

## 2022-04-24 NOTE — Plan of Care (Signed)
  Problem: Nutrition: Goal: Adequate nutrition will be maintained Outcome: Progressing   Problem: Coping: Goal: Level of anxiety will decrease Outcome: Progressing   Problem: Education: Goal: Emotional status will improve Outcome: Progressing

## 2022-04-24 NOTE — Progress Notes (Signed)
Patient calm and pleasant during assessment denying SI/HI/AVH. Pt isoaltive to his room tonight but did come out for medication and snacks. Pt compliant with medication administration per MD orders. Pt given education, support, and encouragement to be acitve in his treatment plan. Pt being monitored Q 15 minutes for safety per unit protocol, remains safe on the unit  

## 2022-04-24 NOTE — Plan of Care (Signed)
Pt is calm and cooperative; observed to be interacting at times in the dayroom.  No SI HI AVH reported.  Pt received his medications without incident.  Pt at times was observed by staff seemingly responding to internal stimuli while pt was in bedroom.  Continued monitoring via q 15 minute checks.

## 2022-04-24 NOTE — [facility] Group Notes (Signed)
[facility] Group Notes:  (Nursing/MHT/Case Management/Adjunct)  Date:  04/24/2022  Time:  11:05 AM  Type of Therapy:   Community Meeting  Participation Level:  Did Not Attend   Travis Sandoval Travis Sandoval 04/24/2022, 11:05 AM

## 2022-04-24 NOTE — Group Note (Signed)
[facility] LCSW Group Therapy Note   Group Date: 04/24/2022 Start Time: 1300 End Time: 1400   Type of Therapy/Topic:  Group Therapy:  Emotion Regulation  Participation Level:  Did Not Attend    Description of Group:    The purpose of this group is to assist patients in learning to regulate negative emotions and experience positive emotions. Patients will be guided to discuss ways in which they have been vulnerable to their negative emotions. These vulnerabilities will be juxtaposed with experiences of positive emotions or situations, and patients challenged to use positive emotions to combat negative ones. Special emphasis will be placed on coping with negative emotions in conflict situations, and patients will process healthy conflict resolution skills.  Therapeutic Goals: Patient will identify two positive emotions or experiences to reflect on in order to balance out negative emotions:  Patient will label two or more emotions that they find the most difficult to experience:  Patient will be able to demonstrate positive conflict resolution skills through discussion or role plays:   Summary of Patient Progress: X   Therapeutic Modalities:   Cognitive Behavioral Therapy Feelings Identification Dialectical Behavioral Therapy   Travis Sandoval Travis Driskill, LCSW 

## 2022-04-24 NOTE — Progress Notes (Addendum)
Pt denies SI/HI/AVH and verbally agrees to approach staff if these become apparent or before harming themselves/others. Rates depression 0/10. Rates anxiety 0/10. Rates pain 0/10. Pt has been in his room all day. Pt comes for meds and meals but does not attend groups. Pt seems to be talking better but there are times when pt will ramble. Scheduled medications administered to pt, per MD orders. RN provided support and encouragement to pt. Q15 min safety checks implemented and continued. Pt safe on the unit. RN will continue to monitor and intervene as needed.  04/24/22 0829  Psych Admission Type (Psych Patients Only)  Admission Status Involuntary  Psychosocial Assessment  Patient Complaints None  Eye Contact Fair  Facial Expression Masked  Affect Apprehensive  Speech Logical/coherent  Interaction Isolative  Motor Activity Slow  Appearance/Hygiene In scrubs  Behavior Characteristics Cooperative;Appropriate to situation;Calm  Mood Preoccupied  Thought Process  Coherency Tangential  Content WDL  Delusions None reported or observed  Perception WDL  Hallucination None reported or observed  Judgment Impaired  Confusion None  Danger to Self  Current suicidal ideation? Denies  Danger to Others  Danger to Others None reported or observed

## 2022-04-25 DIAGNOSIS — F203 Undifferentiated schizophrenia: Secondary | ICD-10-CM | POA: Diagnosis not present

## 2022-04-25 NOTE — [facility] Group Notes (Signed)
[place] Group Notes:  (Nursing/MHT/Case Management/Adjunct)  Date:  04/25/2022  Time:  8:44 PM  Type of Therapy:   Wrap up  Participation Level:  Did Not Attend  Summary of Progress/Problems:  Travis Sandoval 04/25/2022, 8:44 PM

## 2022-04-25 NOTE — Progress Notes (Signed)
Pt denies SI/HI/AVH and verbally agrees to approach staff if these become apparent or before harming themselves/others. Rates depression 0/10. Rates anxiety 0/10. Rates pain 0/10. Pt has been in his room all day.   Scheduled medications administered to pt, per MD orders. RN provided support and encouragement to pt. Q15 min safety checks implemented and continued. Pt safe on the unit. RN will continue to monitor and intervene as needed.  04/25/22 0800  Psych Admission Type (Psych Patients Only)  Admission Status Involuntary  Psychosocial Assessment  Patient Complaints None  Eye Contact Fair  Facial Expression Masked  Affect Apprehensive  Speech Tangential;Neologisms  Interaction Isolative  Motor Activity Slow  Appearance/Hygiene Unremarkable;In scrubs  Behavior Characteristics Cooperative;Appropriate to situation;Calm  Mood Preoccupied  Aggressive Behavior  Effect No apparent injury  Thought Process  Coherency Disorganized;Tangential  Content Preoccupation  Delusions None reported or observed  Perception WDL  Hallucination None reported or observed  Judgment Impaired  Confusion None  Danger to Self  Current suicidal ideation? Denies  Danger to Others  Danger to Others None reported or observed

## 2022-04-25 NOTE — Progress Notes (Signed)
Patient calm and pleasant during assessment denying SI/HI/AVH. Pt isoaltive to his room tonight but did come out for medication and snacks. Pt compliant with medication administration per MD orders. Pt given education, support, and encouragement to be acitve in his treatment plan. Pt being monitored Q 15 minutes for safety per unit protocol, remains safe on the unit  

## 2022-04-25 NOTE — Group Note (Signed)
LCSW Group Therapy Note  Group Date: 04/25/2022 Start Time: 1300 End Time: 1400   Type of Therapy and Topic:  Group Therapy: Positive Affirmations  Participation Level:  Did Not Attend   Description of Group:   This group addressed positive affirmation towards self and others.  Patients went around the room and identified two positive things about themselves and two positive things about a peer in the room.  Patients reflected on how it felt to share something positive with others, to identify positive things about themselves, and to hear positive things from others/ Patients were encouraged to have a daily reflection of positive characteristics or circumstances.   Therapeutic Goals: Patients will verbalize two of their positive qualities Patients will demonstrate empathy for others by stating two positive qualities about a peer in the group Patients will verbalize their feelings when voicing positive self affirmations and when voicing positive affirmations of others Patients will discuss the potential positive impact on their wellness/recovery of focusing on positive traits of self and others.  Summary of Patient Progress:   Patient declined to attend group despite being encouraged to attend by this clinician.   Therapeutic Modalities:   Cognitive Behavioral Therapy Motivational Interviewing    Rozann Lesches, [place] 04/25/2022  1:54 PM

## 2022-04-25 NOTE — Progress Notes (Signed)
Recreation Therapy Notes   Date: 04/25/2022  Time: 10:05 am  Location: Craft room     Behavioral response: N/A   Intervention Topic: Time Management    Discussion/Intervention: Patient refused to attend group.   Clinical Observations/Feedback:  Patient refused to attend group.    Jaiden Wahab LRT/CTRS        Dezaray Shibuya 04/25/2022 11:26 AM

## 2022-04-25 NOTE — Plan of Care (Signed)
  Problem: Nutrition: Goal: Adequate nutrition will be maintained Outcome: Progressing   Problem: Coping: Goal: Level of anxiety will decrease Outcome: Progressing   Problem: Pain Managment: Goal: General experience of comfort will improve Outcome: Progressing   

## 2022-04-25 NOTE — Progress Notes (Signed)
[facility] MD Progress Note  04/25/2022 11:22 AM Travis Sandoval  MRN:  [id number] Subjective: Follow-up this patient with schizophrenia.  In the last day he is started taking his medicine orally.  On interview today had no complaints.  Mostly stays in bed.  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 No pertinent surgical history. Family History:  Family History  Family history unknown: Yes   Family Psychiatric  History: See previous Social History:  Social History   Substance and Sexual Activity  Alcohol Use Not Currently     Social History   Substance and Sexual Activity  Drug Use Not Currently    Social History   Socioeconomic History   Marital status: Unknown    Spouse name: Not on file   Number of children: Not on file   Years of education: Not on file   Highest education level: Not on file  Occupational History   Not on file  Tobacco Use   Smoking status: Every Day    Packs/day: 0.25    Years: 15.00    Total pack years: 3.75    Types: Cigarettes   Smokeless tobacco: Not on file  Vaping Use   Vaping Use: Not on file  Substance and Sexual Activity   Alcohol use: Not Currently   Drug use: Not Currently   Sexual activity: Not Currently  Other Topics Concern   Not on file  Social History Narrative   Not on file   Social Determinants of Health   Financial Resource Strain: Not on file  Food Insecurity: No Food Insecurity (04/16/2022)   Hunger Vital Sign    Worried  About Running Out of Food in the Last Year: Never true    Ran Out of Food in the Last Year: Never true  Transportation Needs: No Transportation Needs (04/16/2022)   PRAPARE - Hydrologist (Medical): No    Lack of Transportation (Non-Medical): No  Physical Activity: Not on file  Stress: Not on file  Social Connections: Not on file   Additional Social History:                         Sleep: Fair  Appetite:  Fair  Current Medications: Current Facility-Administered Medications  Medication Dose Route Frequency Provider Last Rate Last Admin   acetaminophen (TYLENOL) tablet 650 mg  650 mg Oral Q6H PRN [name], NP       alum & mag hydroxide-simeth (MAALOX/MYLANTA) 200-200-20 MG/5ML suspension 30 mL  30 mL Oral Q4H PRN [name], NP       cloZAPine (CLOZARIL) tablet 100 mg  100 mg Oral QHS Chasten Blaze T, MD   100 mg at 04/24/22 2128   ziprasidone (GEODON) injection 20 mg  20 mg Intramuscular Q12H PRN [name], MD       And   LORazepam (ATIVAN) tablet 2 mg  2 mg Oral PRN [name], MD       magnesium hydroxide (  MILK OF MAGNESIA) suspension 30 mL  30 mL Oral Daily PRN Gabriel CirriBarthold, Louise F, NP       nicotine polacrilex (NICORETTE) gum 2 mg  2 mg Oral PRN Sahily Biddle, Jackquline DenmarkJohn T, MD   2 mg at 04/23/22 2200   OLANZapine (ZYPREXA) injection 10 mg  10 mg Intramuscular BID Terria Deschepper T, MD   10 mg at 04/25/22 0800   polyethylene glycol (MIRALAX / GLYCOLAX) packet 17 g  17 g Oral Daily Reggie PileJoshi, Anand, MD       valproic acid (DEPAKENE) 250 MG/5ML solution 500 mg  500 mg Oral BID Reggie PileJoshi, Anand, MD   500 mg at 04/25/22 0756    Lab Results:  Results for orders placed or performed during the hospital encounter of 04/16/22 (from the past 48 hour(s))  CBC with Differential/Platelet     Status: None   Collection Time: 04/24/22 11:55 AM  Result Value Ref Range   WBC 6.9 4.0 - 10.5 K/uL   RBC 4.72 4.22 - 5.81 MIL/uL   Hemoglobin 14.8 13.0 - 17.0  g/dL   HCT 91.443.7 78.239.0 - 95.652.0 %   MCV 92.6 80.0 - 100.0 fL   MCH 31.4 26.0 - 34.0 pg   MCHC 33.9 30.0 - 36.0 g/dL   RDW 21.312.7 08.611.5 - 57.815.5 %   Platelets 157 150 - 400 K/uL   nRBC 0.0 0.0 - 0.2 %   Neutrophils Relative % 51 %   Neutro Abs 3.5 1.7 - 7.7 K/uL   Lymphocytes Relative 36 %   Lymphs Abs 2.5 0.7 - 4.0 K/uL   Monocytes Relative 12 %   Monocytes Absolute 0.9 0.1 - 1.0 K/uL   Eosinophils Relative 1 %   Eosinophils Absolute 0.1 0.0 - 0.5 K/uL   Basophils Relative 0 %   Basophils Absolute 0.0 0.0 - 0.1 K/uL   Immature Granulocytes 0 %   Abs Immature Granulocytes 0.01 0.00 - 0.07 K/uL    Comment: Performed at [facility], [address] [place], [place] [id number]  Lipid panel     Status: Abnormal   Collection Time: 04/24/22  3:36 PM  Result Value Ref Range   Cholesterol 220 (H) 0 - 200 mg/dL   Triglycerides 952700 (H) <150 mg/dL   HDL 35 (L) >84>40 mg/dL   Total CHOL/HDL Ratio 6.3 RATIO   VLDL UNABLE TO CALCULATE IF TRIGLYCERIDE OVER 400 mg/dL 0 - 40 mg/dL   LDL Cholesterol UNABLE TO CALCULATE IF TRIGLYCERIDE OVER 400 mg/dL 0 - 99 mg/dL    Comment:        Total Cholesterol/HDL:CHD Risk Coronary Heart Disease Risk Table                     Men   Women  1/2 Average Risk   3.4   3.3  Average Risk       5.0   4.4  2 X Average Risk   9.6   7.1  3 X Average Risk  23.4   11.0        Use the calculated Patient Ratio above and the CHD Risk Table to determine the patient's CHD Risk.        ATP III CLASSIFICATION (LDL):  <100     mg/dL   Optimal  132-440100-129  mg/dL   Near or Above                    Optimal  130-159  mg/dL   Borderline  160-189  mg/dL   High  >190     mg/dL   Very High Performed at [facility], [address] [place], [place] [address]   Hemoglobin A1c     Status: None   Collection Time: 04/24/22  3:36 PM  Result Value Ref Range   Hgb A1c MFr Bld 5.5 4.8 - 5.6 %    Comment: (NOTE)         Prediabetes: 5.7 - 6.4         Diabetes: >6.4          Glycemic control for adults with diabetes: <7.0    Mean Plasma Glucose 111 mg/dL    Comment: (NOTE) Performed At: [facility] [address] [place], [place] [id number] Rush Farmer MD YB:[id number]   LDL cholesterol, direct     Status: Abnormal   Collection Time: 04/24/22  3:36 PM  Result Value Ref Range   Direct LDL 101 (H) 0 - 99 mg/dL    Comment: Performed at [facility], [place] [address], [place], [place] [address]    Blood Alcohol level:  Lab Results  Component Value Date   ETH <10 57/26/2035    Metabolic Disorder Labs: Lab Results  Component Value Date   HGBA1C 5.5 04/24/2022   MPG 111 04/24/2022   No results found for: "PROLACTIN" Lab Results  Component Value Date   CHOL 220 (H) 04/24/2022   TRIG 700 (H) 04/24/2022   HDL 35 (L) 04/24/2022   CHOLHDL 6.3 04/24/2022   VLDL UNABLE TO CALCULATE IF TRIGLYCERIDE OVER 400 mg/dL 04/24/2022   LDLCALC UNABLE TO CALCULATE IF TRIGLYCERIDE OVER 400 mg/dL 04/24/2022    Physical Findings: AIMS: Facial and Oral Movements Muscles of Facial Expression: None, normal Lips and Perioral Area: None, normal Jaw: None, normal Tongue: None, normal,Extremity Movements Upper (arms, wrists, hands, fingers): None, normal Lower (legs, knees, ankles, toes): None, normal, Trunk Movements Neck, shoulders, hips: None, normal, Overall Severity Severity of abnormal movements (highest score from questions above): None, normal Incapacitation due to abnormal movements: None, normal Patient's awareness of abnormal movements (rate only patient's report): No Awareness, Dental Status Current problems with teeth and/or dentures?: No Does patient usually wear dentures?: No  CIWA:    COWS:     Musculoskeletal: Strength & Muscle Tone: within normal limits Gait & Station: normal Patient leans: N/A  Psychiatric Specialty Exam:  Presentation  General Appearance:  Disheveled  Eye  Contact: Minimal  Speech: Pressured  Speech Volume: Normal  Handedness: Right   Mood and Affect  Mood: Euphoric; Irritable  Affect: Inappropriate   Thought Process  Thought Processes: Disorganized  Descriptions of Associations:Loose  Orientation:Partial  Thought Content:Illogical; Scattered  History of Schizophrenia/Schizoaffective disorder:No data recorded Duration of Psychotic Symptoms:No data recorded Hallucinations:No data recorded Ideas of Reference:Delusions; Paranoia  Suicidal Thoughts:No data recorded Homicidal Thoughts:No data recorded  Sensorium  Memory: Immediate Poor; Recent Poor; Remote Poor  Judgment: Poor  Insight: Poor   Executive Functions  Concentration: Fair  Attention Span: Fair  Recall: Poor  Fund of Knowledge: Poor  Language: Poor   Psychomotor Activity  Psychomotor Activity:No data recorded  Assets  Assets: Desire for Improvement; Social Support   Sleep  Sleep:No data recorded   Physical Exam: Physical Exam Vitals and nursing note reviewed.  Constitutional:      Appearance: Normal appearance.  HENT:     Head: Normocephalic and atraumatic.     Mouth/Throat:     Pharynx: Oropharynx is clear.  Eyes:  Pupils: Pupils are equal, round, and reactive to light.  Cardiovascular:     Rate and Rhythm: Normal rate and regular rhythm.  Pulmonary:     Effort: Pulmonary effort is normal.     Breath sounds: Normal breath sounds.  Abdominal:     General: Abdomen is flat.     Palpations: Abdomen is soft.  Musculoskeletal:        General: Normal range of motion.  Skin:    General: Skin is warm and dry.  Neurological:     General: No focal deficit present.     Mental Status: He is alert. Mental status is at baseline.  Psychiatric:        Attention and Perception: Attention normal.        Mood and Affect: Mood normal. Affect is blunt.        Speech: Speech normal.        Behavior: Behavior is withdrawn.  Behavior is cooperative.        Thought Content: Thought content normal.    Review of Systems  Constitutional: Negative.   HENT: Negative.    Eyes: Negative.   Respiratory: Negative.    Cardiovascular: Negative.   Gastrointestinal: Negative.   Musculoskeletal: Negative.   Skin: Negative.   Neurological: Negative.   Psychiatric/Behavioral: Negative.     Blood pressure 112/82, pulse (!) 112, temperature 98.2 F (36.8 C), temperature source Oral, resp. rate 20, height 5\' 9"  (1.753 m), weight 96.4 kg, SpO2 95 %. Body mass index is 31.38 kg/m.   Treatment Plan Summary: Medication management and Plan taking oral medicine now which is good.  Lots of praise to him for doing that.  Just back on the Depakote a day not long enough to check a blood level yet.  Encourage patient to attend groups to get out of his room and interact more so we have a better all around feel of his mental state.  Possible discharge within 3 to 4 days.  Alethia Berthold, MD 04/25/2022, 11:22 AM

## 2022-04-26 DIAGNOSIS — F203 Undifferentiated schizophrenia: Secondary | ICD-10-CM | POA: Diagnosis not present

## 2022-04-26 MED ORDER — CLOZAPINE 25 MG PO TABS
150.0000 mg | ORAL_TABLET | Freq: Every day | ORAL | Status: DC
  Administered 2022-04-26 – 2022-05-08 (×12): 150 mg via ORAL
  Filled 2022-04-26 (×3): qty 2
  Filled 2022-04-26: qty 1
  Filled 2022-04-26 (×11): qty 2

## 2022-04-26 NOTE — Group Note (Signed)
[facility] LCSW Group Therapy Note   Group Date: 04/26/2022 Start Time: 1300 End Time: 1400  Type of Therapy and Topic:  Group Therapy:  Feelings around Relapse and Recovery  Participation Level:  Did Not Attend    Description of Group:    Patients in this group will discuss emotions they experience before and after a relapse. They will process how experiencing these feelings, or avoidance of experiencing them, relates to having a relapse. Facilitator will guide patients to explore emotions they have related to recovery. Patients will be encouraged to process which emotions are more powerful. They will be guided to discuss the emotional reaction significant others in their lives may have to patients' relapse or recovery. Patients will be assisted in exploring ways to respond to the emotions of others without this contributing to a relapse.  Therapeutic Goals: Patient will identify two or more emotions that lead to relapse for them:  Patient will identify two emotions that result when they relapse:  Patient will identify two emotions related to recovery:  Patient will demonstrate ability to communicate their needs through discussion and/or role plays.   Summary of Patient Progress: X   Therapeutic Modalities:   Cognitive Behavioral Therapy Solution-Focused Therapy Assertiveness Training Relapse Prevention Therapy   Jackelin Correia R Nikole Swartzentruber, LCSW 

## 2022-04-26 NOTE — Progress Notes (Signed)
Pt denies SI/HI/AVH and verbally agrees to approach staff if these become apparent or before harming themselves/others. Rates depression 0/10. Rates anxiety 0/10. Rates pain 0/10. Pt has been in his room all day. Scheduled medications administered to pt, per MD orders. RN provided support and encouragement to pt. Q15 min safety checks implemented and continued. Pt safe on the unit. RN will continue to monitor and intervene as needed.  04/26/22 0826  Psych Admission Type (Psych Patients Only)  Admission Status Involuntary  Psychosocial Assessment  Patient Complaints None  Eye Contact Fair  Facial Expression Masked  Affect Apprehensive  Speech Tangential  Interaction Minimal;Isolative  Motor Activity Slow  Appearance/Hygiene Unremarkable  Behavior Characteristics Cooperative;Appropriate to situation  Mood Pleasant;Preoccupied  Aggressive Behavior  Effect No apparent injury  Thought Process  Coherency Disorganized;Tangential  Content Preoccupation  Delusions None reported or observed  Perception WDL  Hallucination None reported or observed  Judgment Impaired  Confusion None  Danger to Self  Current suicidal ideation? Denies  Danger to Others  Danger to Others None reported or observed

## 2022-04-26 NOTE — Plan of Care (Signed)
  Problem: Nutrition: Goal: Adequate nutrition will be maintained Outcome: Progressing   Problem: Coping: Goal: Level of anxiety will decrease Outcome: Progressing   Problem: Elimination: Goal: Will not experience complications related to bowel motility Outcome: Progressing   

## 2022-04-26 NOTE — Progress Notes (Signed)
[facility] MD Progress Note  04/26/2022 2:37 PM Travis Sandoval  MRN:  [id number] Subjective: Follow-up 42 year old man with schizophrenia.  Patient seen and chart reviewed.  Patient mostly stays to himself.  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  He has been compliant with his medicine. Principal Problem: Schizophrenia ([place]) Diagnosis: Principal Problem:   Schizophrenia ([place]) Active Problems:   Psychosis ([place])  Total Time spent with patient: 30 minutes  Past Psychiatric History: Past history of schizophrenia  Past Medical History:  Past Medical History:  Diagnosis Date   ADHD    Bipolar 1 disorder ([place])    Hepatitis C    Schizoaffective disorder ([place])    History reviewed. No pertinent surgical history. Family History:  Family History  Family history unknown: Yes   Family Psychiatric  History: See previous Social History:  Social History   Substance and Sexual Activity  Alcohol Use Not Currently     Social History   Substance and Sexual Activity  Drug Use Not Currently    Social History   Socioeconomic History   Marital status: Unknown    Spouse name: Not on file   Number of children: Not on file   Years of education: Not on file   Highest education level: Not on file  Occupational History   Not on file  Tobacco Use   Smoking status: Every Day    Packs/day: 0.25    Years: 15.00    Total pack years: 3.75    Types: Cigarettes   Smokeless tobacco: Not on file  Vaping Use   Vaping Use: Not on file  Substance and Sexual Activity   Alcohol use: Not Currently   Drug use: Not Currently   Sexual activity: Not Currently  Other Topics Concern   Not on file  Social History Narrative   Not on file   Social Determinants of Health   Financial Resource Strain: Not on file  Food Insecurity: No Food Insecurity (04/16/2022)   Hunger Vital Sign    Worried About Running Out of Food in the  Last Year: Never true    Ran Out of Food in the Last Year: Never true  Transportation Needs: No Transportation Needs (04/16/2022)   PRAPARE - Hydrologist (Medical): No    Lack of Transportation (Non-Medical): No  Physical Activity: Not on file  Stress: Not on file  Social Connections: Not on file   Additional Social History:                         Sleep: Fair  Appetite:  Fair  Current Medications: Current Facility-Administered Medications  Medication Dose Route Frequency Provider Last Rate Last Admin   acetaminophen (TYLENOL) tablet 650 mg  650 mg Oral Q6H PRN [name], NP       alum & mag hydroxide-simeth (MAALOX/MYLANTA) 200-200-20 MG/5ML suspension 30 mL  30 mL Oral Q4H PRN [name], NP       cloZAPine (CLOZARIL) tablet 150 mg  150 mg Oral QHS Salena Ortlieb T, MD       ziprasidone (GEODON) injection 20 mg  20 mg Intramuscular Q12H PRN [name], MD       And   LORazepam (ATIVAN) tablet 2 mg  2 mg Oral PRN [name], MD       magnesium hydroxide (MILK OF MAGNESIA) suspension 30 mL  30 mL Oral Daily PRN [name], NP       nicotine polacrilex (NICORETTE) gum 2 mg  2 mg Oral PRN Kyrianna Barletta, [name], MD   2 mg at 04/23/22 2200   OLANZapine (ZYPREXA) injection 10 mg  10 mg Intramuscular BID Sundeep Destin, [name], MD   10 mg at 04/26/22 0258   polyethylene glycol (MIRALAX / GLYCOLAX) packet 17 g  17 g Oral Daily [name], MD       valproic acid (DEPAKENE) 250 MG/5ML solution 500 mg  500 mg Oral BID [name], MD   500 mg at 04/26/22 5277    Lab Results:  Results for orders placed or performed during the hospital encounter of 04/16/22 (from the past 48 hour(s))  Lipid panel     Status: Abnormal   Collection Time: 04/24/22  3:36 PM  Result Value Ref Range   Cholesterol 220 (H) 0 - 200 mg/dL   Triglycerides 700 (H) <150 mg/dL   HDL 35 (L) >40 mg/dL   Total CHOL/HDL Ratio 6.3 RATIO   VLDL UNABLE TO CALCULATE IF  TRIGLYCERIDE OVER 400 mg/dL 0 - 40 mg/dL   LDL Cholesterol UNABLE TO CALCULATE IF TRIGLYCERIDE OVER 400 mg/dL 0 - 99 mg/dL    Comment:        Total Cholesterol/HDL:CHD Risk Coronary Heart Disease Risk Table                     Men   Women  1/2 Average Risk   3.4   3.3  Average Risk       5.0   4.4  2 X Average Risk   9.6   7.1  3 X Average Risk  23.4   11.0        Use the calculated Patient Ratio above and the CHD Risk Table to determine the patient's CHD Risk.        ATP III CLASSIFICATION (LDL):  <100     mg/dL   Optimal  100-129  mg/dL   Near or Above                    Optimal  130-159  mg/dL   Borderline  160-189  mg/dL   High  >190     mg/dL   Very High Performed at [facility], [address] [place], [place] [address]   Hemoglobin A1c     Status: None   Collection Time: 04/24/22  3:36 PM  Result Value Ref Range   Hgb A1c MFr Bld 5.5 4.8 - 5.6 %    Comment: (NOTE)         Prediabetes: 5.7 - 6.4         Diabetes: >6.4         Glycemic control for adults with diabetes: <7.0    Mean Plasma Glucose 111 mg/dL    Comment: (NOTE) Performed At: [facility] [address] [place], [place] [id number] Rush Farmer MD QM:[id number]   LDL cholesterol, direct     Status: Abnormal   Collection Time: 04/24/22  3:36 PM  Result Value Ref Range   Direct LDL 101 (H) 0 - 99 mg/dL    Comment: Performed at [facility], [place] [address], [place], [place] [address]    Blood Alcohol level:  Lab Results  Component Value Date   [facility] <10 24/58/0998    Metabolic Disorder Labs: Lab Results  Component Value Date   HGBA1C  5.5 04/24/2022   MPG 111 04/24/2022   No results found for: "PROLACTIN" Lab Results  Component Value Date   CHOL 220 (H) 04/24/2022   TRIG 700 (H) 04/24/2022   HDL 35 (L) 04/24/2022   CHOLHDL 6.3 04/24/2022   VLDL UNABLE TO CALCULATE IF TRIGLYCERIDE OVER 400 mg/dL 08/65/7846   LDLCALC UNABLE TO CALCULATE IF TRIGLYCERIDE OVER 400  mg/dL 96/29/5284    Physical Findings: AIMS: Facial and Oral Movements Muscles of Facial Expression: None, normal Lips and Perioral Area: None, normal Jaw: None, normal Tongue: None, normal,Extremity Movements Upper (arms, wrists, hands, fingers): None, normal Lower (legs, knees, ankles, toes): None, normal, Trunk Movements Neck, shoulders, hips: None, normal, Overall Severity Severity of abnormal movements (highest score from questions above): None, normal Incapacitation due to abnormal movements: None, normal Patient's awareness of abnormal movements (rate only patient's report): No Awareness, Dental Status Current problems with teeth and/or dentures?: No Does patient usually wear dentures?: No  CIWA:    COWS:     Musculoskeletal: Strength & Muscle Tone: within normal limits Gait & Station: normal Patient leans: N/A  Psychiatric Specialty Exam:  Presentation  General Appearance:  Disheveled  Eye Contact: Minimal  Speech: Pressured  Speech Volume: Normal  Handedness: Right   Mood and Affect  Mood: Euphoric; Irritable  Affect: Inappropriate   Thought Process  Thought Processes: Disorganized  Descriptions of Associations:Loose  Orientation:Partial  Thought Content:Illogical; Scattered  History of Schizophrenia/Schizoaffective disorder:No data recorded Duration of Psychotic Symptoms:No data recorded Hallucinations:No data recorded Ideas of Reference:Delusions; Paranoia  Suicidal Thoughts:No data recorded Homicidal Thoughts:No data recorded  Sensorium  Memory: Immediate Poor; Recent Poor; Remote Poor  Judgment: Poor  Insight: Poor   Executive Functions  Concentration: Fair  Attention Span: Fair  Recall: Poor  Fund of Knowledge: Poor  Language: Poor   Psychomotor Activity  Psychomotor Activity:No data recorded  Assets  Assets: Desire for Improvement; Social Support   Sleep  Sleep:No data recorded   Physical  Exam: Physical Exam Vitals and nursing note reviewed.  Constitutional:      Appearance: Normal appearance.  HENT:     Head: Normocephalic and atraumatic.     Mouth/Throat:     Pharynx: Oropharynx is clear.  Eyes:     Pupils: Pupils are equal, round, and reactive to light.  Cardiovascular:     Rate and Rhythm: Normal rate and regular rhythm.  Pulmonary:     Effort: Pulmonary effort is normal.     Breath sounds: Normal breath sounds.  Abdominal:     General: Abdomen is flat.     Palpations: Abdomen is soft.  Musculoskeletal:        General: Normal range of motion.  Skin:    General: Skin is warm and dry.  Neurological:     General: No focal deficit present.     Mental Status: He is alert. Mental status is at baseline.  Psychiatric:        Attention and Perception: Attention normal.        Mood and Affect: Mood normal. Affect is blunt.        Speech: Speech is delayed.        Behavior: Behavior is slowed.        Thought Content: Thought content normal.    Review of Systems  Constitutional: Negative.   HENT: Negative.    Eyes: Negative.   Respiratory: Negative.    Cardiovascular: Negative.   Gastrointestinal: Negative.   Musculoskeletal: Negative.   Skin: Negative.  Neurological: Negative.   Psychiatric/Behavioral: Negative.     Blood pressure 120/77, pulse (!) 105, temperature 98 F (36.7 C), temperature source Oral, resp. rate 18, height 5\' 9"  (1.753 m), weight 96.4 kg, SpO2 100 %. Body mass index is 31.38 kg/m.   Treatment Plan Summary: Plan increase Clozapine to 150 mg at night.  Encourage patient with continuing to take his medicine.  He is asking about discharge.  We might be able to discharge him early next week.  Social work discovered that there may be a with his payments though so we will have to check with the group home.  Surveyor, mining, MD 04/26/2022, 2:37 PM

## 2022-04-26 NOTE — Progress Notes (Signed)
Recreation Therapy Notes  Date: 04/26/2022  Time: 10:50 am  Location: Craft room     Behavioral response: N/A   Intervention Topic: Life Planning    Discussion/Intervention: Patient refused to attend group.   Clinical Observations/Feedback:  Patient refused to attend group.    Eliazar Olivar LRT/CTRS        Gari Hartsell 04/26/2022 1:01 PM

## 2022-04-27 DIAGNOSIS — F203 Undifferentiated schizophrenia: Secondary | ICD-10-CM | POA: Diagnosis not present

## 2022-04-27 MED ORDER — PALIPERIDONE PALMITATE ER 234 MG/1.5ML IM SUSY
234.0000 mg | PREFILLED_SYRINGE | Freq: Once | INTRAMUSCULAR | Status: AC
  Administered 2022-04-27: 234 mg via INTRAMUSCULAR
  Filled 2022-04-27: qty 1.5

## 2022-04-27 NOTE — Progress Notes (Signed)
[facility] MD Progress Note  04/27/2022 12:35 PM Travis Sandoval  MRN:  [id number] Subjective: Follow-up 42 year old man with schizophrenia.  Patient today asked me if he could stop taking his clozapine.  He complains that is making him drool.  No visible drooling noted.  When I engaged him in conversation about the seriousness of his illness and the need for medication he started showing more signs of psychosis.  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  He continues to mostly just stay in bed all of the time.  No aggression or threatening behavior. Principal Problem: Schizophrenia (HCC) Diagnosis: Principal Problem:   Schizophrenia (HCC) Active Problems:   Psychosis (HCC)  Total Time spent with patient: 30 minutes  Past Psychiatric History: Past history of schizophrenia.  A lot of details not known other than clearly he has had extended time with psychosis and several inpatient hospitalizations  Past Medical History:  Past Medical History:  Diagnosis Date   ADHD    Bipolar 1 disorder (HCC)    Hepatitis C    Schizoaffective disorder (HCC)    History reviewed. No pertinent surgical history. Family History:  Family History  Family history unknown: Yes   Family Psychiatric  History: See previous Social History:  Social History   Substance and Sexual Activity  Alcohol Use Not Currently     Social History   Substance and Sexual Activity  Drug Use Not Currently    Social History   Socioeconomic History   Marital status: Unknown    Spouse name: Not on file   Number of children: Not on file   Years of education: Not on file   Highest education level: Not on file  Occupational History   Not on file  Tobacco Use   Smoking status: Every Day    Packs/day: 0.25    Years: 15.00    Total pack years: 3.75    Types: Cigarettes   Smokeless tobacco: Not on file  Vaping Use   Vaping Use: Not on file  Substance and Sexual Activity   Alcohol use: Not  Currently   Drug use: Not Currently   Sexual activity: Not Currently  Other Topics Concern   Not on file  Social History Narrative   Not on file   Social Determinants of Health   Financial Resource Strain: Not on file  Food Insecurity: No Food Insecurity (04/16/2022)   Hunger Vital Sign    Worried About Running Out of Food in the Last Year: Never true    Ran Out of Food in the Last Year: Never true  Transportation Needs: No Transportation Needs (04/16/2022)   PRAPARE - Administrator, Civil Service (Medical): No    Lack of Transportation (Non-Medical): No  Physical Activity: Not on file  Stress: Not on file  Social Connections: Not on file   Additional Social History:                         Sleep: Fair  Appetite:  Fair  Current Medications: Current Facility-Administered Medications  Medication Dose Route Frequency Provider Last Rate Last Admin   acetaminophen (TYLENOL) tablet 650 mg  650 mg Oral Q6H PRN Vanetta Mulders, NP       alum & mag hydroxide-simeth (MAALOX/MYLANTA) 200-200-20 MG/5ML suspension 30 mL  30 mL Oral Q4H PRN Gabriel Cirri F, NP       cloZAPine (CLOZARIL) tablet 150 mg  150 mg Oral QHS  Anquinette Pierro, [name], MD   150 mg at 04/26/22 2145   ziprasidone (GEODON) injection 20 mg  20 mg Intramuscular Q12H PRN [name], MD       And   LORazepam (ATIVAN) tablet 2 mg  2 mg Oral PRN [name], MD       magnesium hydroxide (MILK OF MAGNESIA) suspension 30 mL  30 mL Oral Daily PRN [name], NP       nicotine polacrilex (NICORETTE) gum 2 mg  2 mg Oral PRN Laron Angelini T, MD   2 mg at 04/23/22 2200   OLANZapine (ZYPREXA) injection 10 mg  10 mg Intramuscular BID Renesme Kerrigan, [name], MD   10 mg at 04/27/22 2229   paliperidone (INVEGA SUSTENNA) injection 234 mg  234 mg Intramuscular Once Haneefah Venturini, [name], MD       polyethylene glycol (MIRALAX / GLYCOLAX) packet 17 g  17 g Oral Daily [name], MD       valproic acid (DEPAKENE) 250  MG/5ML solution 500 mg  500 mg Oral BID [name], MD   500 mg at 04/27/22 7989    Lab Results: No results found for this or any previous visit (from the past 24 hour(s)).  Blood Alcohol level:  Lab Results  Component Value Date   ETH <10 21/19/4174    Metabolic Disorder Labs: Lab Results  Component Value Date   HGBA1C 5.5 04/24/2022   MPG 111 04/24/2022   No results found for: "PROLACTIN" Lab Results  Component Value Date   CHOL 220 (H) 04/24/2022   TRIG 700 (H) 04/24/2022   HDL 35 (L) 04/24/2022   CHOLHDL 6.3 04/24/2022   VLDL UNABLE TO CALCULATE IF TRIGLYCERIDE OVER 400 mg/dL 04/24/2022   LDLCALC UNABLE TO CALCULATE IF TRIGLYCERIDE OVER 400 mg/dL 04/24/2022    Physical Findings: AIMS: Facial and Oral Movements Muscles of Facial Expression: None, normal Lips and Perioral Area: None, normal Jaw: None, normal Tongue: None, normal,Extremity Movements Upper (arms, wrists, hands, fingers): None, normal Lower (legs, knees, ankles, toes): None, normal, Trunk Movements Neck, shoulders, hips: None, normal, Overall Severity Severity of abnormal movements (highest score from questions above): None, normal Incapacitation due to abnormal movements: None, normal Patient's awareness of abnormal movements (rate only patient's report): No Awareness, Dental Status Current problems with teeth and/or dentures?: No Does patient usually wear dentures?: No  CIWA:    COWS:     Musculoskeletal: Strength & Muscle Tone: within normal limits Gait & Station: normal Patient leans: N/A  Psychiatric Specialty Exam:  Presentation  General Appearance:  Disheveled  Eye Contact: Minimal  Speech: Pressured  Speech Volume: Normal  Handedness: Right   Mood and Affect  Mood: Euphoric; Irritable  Affect: Inappropriate   Thought Process  Thought Processes: Disorganized  Descriptions of Associations:Loose  Orientation:Partial  Thought Content:Illogical;  Scattered  History of Schizophrenia/Schizoaffective disorder:No data recorded Duration of Psychotic Symptoms:No data recorded Hallucinations:No data recorded Ideas of Reference:Delusions; Paranoia  Suicidal Thoughts:No data recorded Homicidal Thoughts:No data recorded  Sensorium  Memory: Immediate Poor; Recent Poor; Remote Poor  Judgment: Poor  Insight: Poor   Executive Functions  Concentration: Fair  Attention Span: Fair  Recall: Poor  Fund of Knowledge: Poor  Language: Poor   Psychomotor Activity  Psychomotor Activity:No data recorded  Assets  Assets: Desire for Improvement; Social Support   Sleep  Sleep:No data recorded   Physical Exam: Physical Exam Vitals and nursing note reviewed.  Constitutional:      Appearance: Normal appearance.  HENT:     Head: Normocephalic and atraumatic.     Mouth/Throat:     Pharynx: Oropharynx is clear.  Eyes:     Pupils: Pupils are equal, round, and reactive to light.  Cardiovascular:     Rate and Rhythm: Normal rate and regular rhythm.  Pulmonary:     Effort: Pulmonary effort is normal.     Breath sounds: Normal breath sounds.  Abdominal:     General: Abdomen is flat.     Palpations: Abdomen is soft.  Musculoskeletal:        General: Normal range of motion.  Skin:    General: Skin is warm and dry.  Neurological:     General: No focal deficit present.     Mental Status: He is alert. Mental status is at baseline.  Psychiatric:        Attention and Perception: He is inattentive.        Mood and Affect: Mood normal. Affect is blunt.        Speech: Speech normal.        Behavior: Behavior is cooperative.        Thought Content: Thought content is paranoid and delusional.        Cognition and Memory: Cognition is impaired.    Review of Systems  Constitutional: Negative.   HENT: Negative.    Eyes: Negative.   Respiratory: Negative.    Cardiovascular: Negative.   Gastrointestinal: Negative.    Musculoskeletal: Negative.   Skin: Negative.   Neurological: Negative.   Psychiatric/Behavioral:  Negative for depression, hallucinations, substance abuse and suicidal ideas. The patient is nervous/anxious.    Blood pressure 124/78, pulse (!) 107, temperature 97.8 F (36.6 C), temperature source Oral, resp. rate 18, height 5\' 9"  (1.753 m), weight 96.4 kg, SpO2 97 %. Body mass index is 31.38 kg/m.   Treatment Plan Summary: Medication management and Plan somewhat difficult patient.  Clearly still having psychosis despite his current medicine.  Also clearly chronically resistant to staying on oral medication and at high risk for constant relapse.  Patient certainly would be a good candidate for long-acting injectables if it were shown that they benefited him.  I am going to continue his current medicine but also add Invega 234 mg one time dose today IM hoping we can start switching him over to long-acting injectable.  There is no practical availability of the Zyprexa long-acting injectable.  Urged him to continue medication although I am not sure if he is agreeable.  Check Depakote level tomorrow.  , MD 04/27/2022, 12:35 PM

## 2022-04-27 NOTE — Progress Notes (Signed)
Patient alert and oriented x 4, affect is flat thoughts are organized and coherent . Patient denies SI/HI/AVH interacting appropriately with peers and staff and complaint with medication regimen.15 minutes safety checks maintained will continue to monitor.

## 2022-04-27 NOTE — Plan of Care (Signed)
Pt denies anxiety/depression at this time. Pt denies SI/HI/AVH or pain at this time. Pt is calm and cooperative. Pt is medication compliant. Pt provided with support and encouragement. Pt monitored q15 minutes for safety per unit policy. Plan of care ongoing.   Pt received his long acting injectable this afternoon. Pt self isolates to room with exception to meals.   Problem: Education: Goal: Knowledge of General Education information will improve Description: Including pain rating scale, medication(s)/side effects and non-pharmacologic comfort measures Outcome: Progressing   Problem: Nutrition: Goal: Adequate nutrition will be maintained Outcome: Progressing

## 2022-04-28 DIAGNOSIS — F203 Undifferentiated schizophrenia: Secondary | ICD-10-CM | POA: Diagnosis not present

## 2022-04-28 LAB — VALPROIC ACID LEVEL: Valproic Acid Lvl: 28 ug/mL — ABNORMAL LOW (ref 50.0–100.0)

## 2022-04-28 NOTE — Plan of Care (Signed)
Pt denies anxiety/depression at this time. Pt denies SI/HI/AVH or pain at this time. Pt is calm and cooperative. Pt is medication compliant. Pt provided with support and encouragement. Pt monitored q15 minutes for safety per unit policy. Plan of care ongoing.   Pt woke up twice requesting the time. Once given the time the first time the pt went back to room to sleep. The second time pt decided to read and journal before being able to eventually fall asleep.   Problem: Nutrition: Goal: Adequate nutrition will be maintained Outcome: Progressing   Problem: Pain Managment: Goal: General experience of comfort will improve Outcome: Progressing   

## 2022-04-28 NOTE — BH IP Treatment Plan (Signed)
Interdisciplinary Treatment and Diagnostic Plan Update  04/28/2022 Time of Session: 10:40am Travis Sandoval MRN: [id number]  Principal Diagnosis: Schizophrenia [facility])  Secondary Diagnoses: Principal Problem:   Schizophrenia (HCC) Active Problems:   Psychosis (HCC)   Current Medications:  Current Facility-Administered Medications  Medication Dose Route Frequency Provider Last Rate Last Admin   acetaminophen (TYLENOL) tablet 650 mg  650 mg Oral Q6H PRN Vanetta Mulders, NP       alum & mag hydroxide-simeth (MAALOX/MYLANTA) 200-200-20 MG/5ML suspension 30 mL  30 mL Oral Q4H PRN Vanetta Mulders, NP       cloZAPine (CLOZARIL) tablet 150 mg  150 mg Oral QHS [name], [name], MD   150 mg at 04/27/22 2153   ziprasidone (GEODON) injection 20 mg  20 mg Intramuscular Q12H PRN Reggie Pile, MD       And   LORazepam (ATIVAN) tablet 2 mg  2 mg Oral PRN Reggie Pile, MD       magnesium hydroxide (MILK OF MAGNESIA) suspension 30 mL  30 mL Oral Daily PRN Gabriel Cirri F, NP       nicotine polacrilex (NICORETTE) gum 2 mg  2 mg Oral PRN [name], [name], MD   2 mg at 04/23/22 2200   OLANZapine (ZYPREXA) injection 10 mg  10 mg Intramuscular BID [name], [name], MD   10 mg at 04/28/22 0846   valproic acid (DEPAKENE) 250 MG/5ML solution 500 mg  500 mg Oral BID Reggie Pile, MD   500 mg at 04/28/22 0950   PTA Medications: Medications Prior to Admission  Medication Sig Dispense Refill Last Dose   ABILIFY MAINTENA 400 MG SRER injection Inject 400 mg into the muscle every 28 (twenty-eight) days.      ARIPiprazole (ABILIFY) 10 MG tablet Take 10 mg by mouth daily. (Patient not taking: Reported on 04/16/2022)      atomoxetine (STRATTERA) 40 MG capsule Take 40 mg by mouth daily. (Patient not taking: Reported on 04/16/2022)      atorvastatin (LIPITOR) 40 MG tablet Take 40 mg by mouth daily.      budesonide-formoterol (SYMBICORT) 160-4.5 MCG/ACT inhaler Inhale 2 puffs into the lungs.      cetirizine (ZYRTEC)  10 MG tablet Take 10 mg by mouth daily.      clozapine (CLOZARIL) 200 MG tablet Take 200 mg by mouth 2 (two) times daily. Take along with one 25 mg tablet for total 225 mg twice daily      cloZAPine (CLOZARIL) 25 MG tablet Take 25 mg by mouth 2 (two) times daily. Take along with one 200 mg tablet for total 225 mg twice daily      Dexlansoprazole (DEXILANT) 30 MG capsule Take 30 mg by mouth daily. (Patient not taking: Reported on 04/16/2022)      hydrOXYzine (VISTARIL) 50 MG capsule Take 50 mg by mouth 3 (three) times daily as needed for itching. (Patient not taking: Reported on 04/16/2022)      Melatonin 5 MG TABS Take 5 mg by mouth at bedtime as needed (sleep).      metoprolol succinate (TOPROL-XL) 25 MG 24 hr tablet Take 12.5 mg by mouth daily.      Phosphatidyl Choline 65 MG TABS Take 1 tablet by mouth daily.  (Patient not taking: Reported on 04/16/2022)      valproic acid (DEPAKENE) 250 MG/5ML solution Take 750-1,000 mLs by mouth 2 (two) times daily. 1000 mg (20 mL) every morning and 750 mg (15 mL) daily at bedtime  Patient Stressors:    Patient Strengths:    Treatment Modalities: Medication Management, Group therapy, Case management,  1 to 1 session with clinician, Psychoeducation, Recreational therapy.   Physician Treatment Plan for Primary Diagnosis: Schizophrenia ([place]) Long Term Goal(s):     Short Term Goals: None, pt is a poor hx.  Medication Management: Evaluate patient's response, side effects, and tolerance of medication regimen.  Therapeutic Interventions: 1 to 1 sessions, Unit Group sessions and Medication administration.  Evaluation of Outcomes: Progressing  Physician Treatment Plan for Secondary Diagnosis: Principal Problem:   Schizophrenia ([place]) Active Problems:   Psychosis ([place])  Long Term Goal(s):     Short Term Goals: None, pt is a poor hx.     Medication Management: Evaluate patient's response, side effects, and tolerance of medication  regimen.  Therapeutic Interventions: 1 to 1 sessions, Unit Group sessions and Medication administration.  Evaluation of Outcomes: Progressing   RN Treatment Plan for Primary Diagnosis: Schizophrenia ([place]) Long Term Goal(s): Knowledge of disease and therapeutic regimen to maintain health will improve  Short Term Goals: Ability to remain free from injury will improve, Ability to verbalize frustration and anger appropriately will improve, Ability to demonstrate self-control, Ability to participate in decision making will improve, Ability to verbalize feelings will improve, Ability to identify and develop effective coping behaviors will improve, and Compliance with prescribed medications will improve  Medication Management: RN will administer medications as ordered by provider, will assess and evaluate patient's response and provide education to patient for prescribed medication. RN will report any adverse and/or side effects to prescribing provider.  Therapeutic Interventions: 1 on 1 counseling sessions, Psychoeducation, Medication administration, Evaluate responses to treatment, Monitor vital signs and CBGs as ordered, Perform/monitor CIWA, COWS, AIMS and Fall Risk screenings as ordered, Perform wound care treatments as ordered.  Evaluation of Outcomes: Progressing   LCSW Treatment Plan for Primary Diagnosis: Schizophrenia ([place]) Long Term Goal(s): Safe transition to appropriate next level of care at discharge, Engage patient in therapeutic group addressing interpersonal concerns.  Short Term Goals: Engage patient in aftercare planning with referrals and resources, Increase social support, Increase ability to appropriately verbalize feelings, Increase emotional regulation, and Increase skills for wellness and recovery  Therapeutic Interventions: Assess for all discharge needs, 1 to 1 time with Social worker, Explore available resources and support systems, Assess for adequacy in community  support network, Educate family and significant other(s) on suicide prevention, Complete Psychosocial Assessment, Interpersonal group therapy.  Evaluation of Outcomes: Progressing   Progress in Treatment: Attending groups: No. Participating in groups: No. Taking medication as prescribed: No. Toleration medication: No. Family/Significant other contact made: No, will contact:  declined consent to reach family/friend  Patient understands diagnosis: No. Discussing patient identified problems/goals with staff: No. Medical problems stabilized or resolved: Yes. Denies suicidal/homicidal ideation: No. Issues/concerns per patient self-inventory: No. Other: none   New problem(s) identified: No, Describe:  none Update 04/27/2022: No changes at this time.   New Short Term/Long Term Goal(s): Patient to work towards elimination of symptoms of psychosis, medication management for mood stabilization; elimination of SI thoughts; development of comprehensive mental wellness plan. Update 04/27/2022: No changes at this time.   Patient Goals:  No additional goals identified at this time. Patient to continue to work towards original goals identified in initial treatment team meeting. CSW will remain available to patient should they voice additional treatment goals. Update 04/27/2022: No changes at this time.   Discharge Plan or Barriers: No psychosocial barriers identified at this time,  patient to return to place of residence when appropriate for discharge. Update 04/27/2022: No changes at this time.   Reason for Continuation of Hospitalization: Medication stabilization Other; describe psychosis    Estimated Length of Stay: 1-7 days Update 04/27/2022: No changes at this time.  Last 3 [place] Suicide Severity Risk Score: Flowsheet Row Admission (Current) from [facility]16/20[facility] in [facility] ED from 04/15/2022 in [facility] EMERGENCY DEPARTMENT  C-SSRS RISK CATEGORY No Risk No  Risk       Last PHQ 2/9 Scores:     No data to display          Scribe for Treatment Team: Otelia Santee, LCSW 04/28/2022 10:43 AM

## 2022-04-28 NOTE — Progress Notes (Signed)
Patient alert and oriented x 4, he denies SI/HI/AVH interacting appropriately with peers and staff. Patient's thoughts are organized and coherent no distress noted. Patient is complaint with medication regimen, 15 minutes safety checks maintained will continue to monitor.

## 2022-04-28 NOTE — Progress Notes (Signed)
[facility] MD Progress Note  04/28/2022 1:33 PM Travis Sandoval  MRN:  [id number] Subjective: Follow-up patient with schizophrenia.  Yesterday he received an [place] shot.  Has no complaints about it.  Today he is conversant.  Still has some delusions but has not been agitated.  He is requesting discontinuing the Depakote which he feels is making him overly sleepy and dizzy. Principal Problem: Schizophrenia ([place]) Diagnosis: Principal Problem:   Schizophrenia ([place]) Active Problems:   Psychosis ([place])  Total Time spent with patient: 30 minutes  Past Psychiatric History: Past history of recurrent schizophrenia  Past Medical History:  Past Medical History:  Diagnosis Date   ADHD    Bipolar 1 disorder ([place])    Hepatitis C    Schizoaffective disorder ([place])    History reviewed. No pertinent surgical history. Family History:  Family History  Family history unknown: Yes   Family Psychiatric  History: See previous Social History:  Social History   Substance and Sexual Activity  Alcohol Use Not Currently     Social History   Substance and Sexual Activity  Drug Use Not Currently    Social History   Socioeconomic History   Marital status: Unknown    Spouse name: Not on file   Number of children: Not on file   Years of education: Not on file   Highest education level: Not on file  Occupational History   Not on file  Tobacco Use   Smoking status: Every Day    Packs/day: 0.25    Years: 15.00    Total pack years: 3.75    Types: Cigarettes   Smokeless tobacco: Not on file  Vaping Use   Vaping Use: Not on file  Substance and Sexual Activity   Alcohol use: Not Currently   Drug use: Not Currently   Sexual activity: Not Currently  Other Topics Concern   Not on file  Social History Narrative   Not on file   Social Determinants of Health   Financial Resource Strain: Not on file  Food Insecurity: No Food Insecurity (04/16/2022)   Hunger Vital Sign    Worried About Running Out of  Food in the Last Year: Never true    Ran Out of Food in the Last Year: Never true  Transportation Needs: No Transportation Needs (04/16/2022)   PRAPARE - Hydrologist (Medical): No    Lack of Transportation (Non-Medical): No  Physical Activity: Not on file  Stress: Not on file  Social Connections: Not on file   Additional Social History:                         Sleep: Fair  Appetite:  Fair  Current Medications: Current Facility-Administered Medications  Medication Dose Route Frequency Provider Last Rate Last Admin   acetaminophen (TYLENOL) tablet 650 mg  650 mg Oral Q6H PRN [name], NP       alum & mag hydroxide-simeth (MAALOX/MYLANTA) 200-200-20 MG/5ML suspension 30 mL  30 mL Oral Q4H PRN [name], NP       cloZAPine (CLOZARIL) tablet 150 mg  150 mg Oral QHS Annisten Manchester T, MD   150 mg at 04/27/22 2153   ziprasidone (GEODON) injection 20 mg  20 mg Intramuscular Q12H PRN [name], MD       And   LORazepam (ATIVAN) tablet 2 mg  2 mg Oral PRN [name], MD       magnesium hydroxide (  MILK OF MAGNESIA) suspension 30 mL  30 mL Oral Daily PRN Vanetta Mulders, NP       nicotine polacrilex (NICORETTE) gum 2 mg  2 mg Oral PRN Ezri Landers, [name], MD   2 mg at 04/23/22 2200   OLANZapine (ZYPREXA) injection 10 mg  10 mg Intramuscular BID Chablis Losh, [name], MD   10 mg at 04/28/22 0947    Lab Results:  Results for orders placed or performed during the hospital encounter of 04/16/22 (from the past 48 hour(s))  Valproic acid level     Status: Abnormal   Collection Time: 04/28/22  8:57 AM  Result Value Ref Range   Valproic Acid Lvl 28 (L) 50.0 - 100.0 ug/mL    Comment: Performed at [facility], [address], [place], [place] [address]    Blood Alcohol level:  Lab Results  Component Value Date   [facility] <10 04/15/2022    Metabolic Disorder Labs: Lab Results  Component Value Date   HGBA1C 5.5 04/24/2022   MPG 111  04/24/2022   No results found for: "PROLACTIN" Lab Results  Component Value Date   CHOL 220 (H) 04/24/2022   TRIG 700 (H) 04/24/2022   HDL 35 (L) 04/24/2022   CHOLHDL 6.3 04/24/2022   VLDL UNABLE TO CALCULATE IF TRIGLYCERIDE OVER 400 mg/dL 36/62/9476   LDLCALC UNABLE TO CALCULATE IF TRIGLYCERIDE OVER 400 mg/dL 54/65/0354    Physical Findings: AIMS: Facial and Oral Movements Muscles of Facial Expression: None, normal Lips and Perioral Area: None, normal Jaw: None, normal Tongue: None, normal,Extremity Movements Upper (arms, wrists, hands, fingers): None, normal Lower (legs, knees, ankles, toes): None, normal, Trunk Movements Neck, shoulders, hips: None, normal, Overall Severity Severity of abnormal movements (highest score from questions above): None, normal Incapacitation due to abnormal movements: None, normal Patient's awareness of abnormal movements (rate only patient's report): No Awareness, Dental Status Current problems with teeth and/or dentures?: No Does patient usually wear dentures?: No  CIWA:    COWS:     Musculoskeletal: Strength & Muscle Tone: within normal limits Gait & Station: normal Patient leans: N/A  Psychiatric Specialty Exam:  Presentation  General Appearance:  Disheveled  Eye Contact: Minimal  Speech: Pressured  Speech Volume: Normal  Handedness: Right   Mood and Affect  Mood: Euphoric; Irritable  Affect: Inappropriate   Thought Process  Thought Processes: Disorganized  Descriptions of Associations:Loose  Orientation:Partial  Thought Content:Illogical; Scattered  History of Schizophrenia/Schizoaffective disorder:No data recorded Duration of Psychotic Symptoms:No data recorded Hallucinations:No data recorded Ideas of Reference:Delusions; Paranoia  Suicidal Thoughts:No data recorded Homicidal Thoughts:No data recorded  Sensorium  Memory: Immediate Poor; Recent Poor; Remote  Poor  Judgment: Poor  Insight: Poor   Executive Functions  Concentration: Fair  Attention Span: Fair  Recall: Poor  Fund of Knowledge: Poor  Language: Poor   Psychomotor Activity  Psychomotor Activity:No data recorded  Assets  Assets: Desire for Improvement; Social Support   Sleep  Sleep:No data recorded   Physical Exam: Physical Exam Vitals reviewed.  Constitutional:      Appearance: Normal appearance.  HENT:     Head: Normocephalic and atraumatic.     Mouth/Throat:     Pharynx: Oropharynx is clear.  Eyes:     Pupils: Pupils are equal, round, and reactive to light.  Cardiovascular:     Rate and Rhythm: Normal rate and regular rhythm.  Pulmonary:     Effort: Pulmonary effort is normal.     Breath sounds: Normal breath sounds.  Abdominal:     General: Abdomen is flat.     Palpations: Abdomen is soft.  Musculoskeletal:        General: Normal range of motion.  Skin:    General: Skin is warm and dry.  Neurological:     General: No focal deficit present.     Mental Status: He is alert. Mental status is at baseline.  Psychiatric:        Attention and Perception: He is inattentive.        Mood and Affect: Mood normal. Affect is blunt.        Speech: Speech is delayed.        Behavior: Behavior is slowed.        Thought Content: Thought content normal.    Review of Systems  Constitutional: Negative.   HENT: Negative.    Eyes: Negative.   Respiratory: Negative.    Cardiovascular: Negative.   Gastrointestinal: Negative.   Musculoskeletal: Negative.   Skin: Negative.   Neurological: Negative.   Psychiatric/Behavioral: Negative.     Blood pressure 124/80, pulse (!) 112, temperature 97.9 F (36.6 C), temperature source Oral, resp. rate 18, height 5\' 9"  (1.753 m), weight 96.4 kg, SpO2 97 %. Body mass index is 31.38 kg/m.   Treatment Plan Summary: Medication management and Plan although he is taking his clozapine and at least at times I am  concerned that with his tendency to noncompliance and poor insight he may ultimately do better on long-acting injectable.  We have started the process by giving him the long-acting Invega.  We may hold onto him until we can do a second shot.  I am discontinuing the Depakote at this time at his request to show good faith.  , MD 04/28/2022, 1:33 PM

## 2022-04-29 DIAGNOSIS — F203 Undifferentiated schizophrenia: Secondary | ICD-10-CM | POA: Diagnosis not present

## 2022-04-29 NOTE — BHH Counselor (Signed)
CSW spoke with patient's CST team.    CST team is under the impression that patient can NOT return to the group home. She reports that the group home is not letting the patient return, then reported concerns that if allowed to return the patient may leave the home again.  She reports that she will follow up with this CSW.  Assunta Curtis, MSW, LCSW 04/29/2022 3:55 PM

## 2022-04-29 NOTE — B[facility] Suicide Risk Assessment (Signed)
Travis Sandoval INPATIENT:  Family/Significant Other Suicide Prevention Education  Suicide Prevention Education:  Education Completed; Travis Sandoval, father, [phone] has been identified by the patient as the family member/significant other with whom the patient will be residing, and identified as the person(s) who will aid the patient in the event of a mental health crisis (suicidal ideations/suicide attempt).  With written consent from the patient, the family member/significant other has been provided the following suicide prevention education, prior to the and/or following the discharge of the patient.  The suicide prevention education provided includes the following: Suicide risk factors Suicide prevention and interventions National Suicide Hotline telephone number [facility] assessment telephone number [facility] Emergency Assistance [place] and/or Residential Mobile Crisis Unit telephone number  Request made of family/significant other to: Remove weapons (e.g., guns, rifles, knives), all items previously/currently identified as safety concern.   Remove drugs/medications (over-the-counter, prescriptions, illicit drugs), all items previously/currently identified as a safety concern.  The family member/significant other verbalizes understanding of the suicide prevention education information provided.  The family member/significant other agrees to remove the items of safety concern listed above.  Fat her reports plans on becoming the patient's legal guardian.  He had several questions for CSW and CSW redirected the patient's father to contact Department of Social Services.  CSW explained that at this time plan are for patient to return to group home and to continue with CST with [place].  CSW explained that to her knowledge patient has not been dropped from T[facility] program so this may continue.  Father expressed concern that patient would not return to the home due  "once he's got it in his head that they're poisoning him he isn't going to stay anywhere".     Travis Sandoval 04/29/2022, 1:38 PM

## 2022-04-29 NOTE — Progress Notes (Signed)
[facility] MD Progress Note  04/29/2022 12:08 PM Travis Sandoval  MRN:  [id number] Subjective: Patient seen for follow-up.  No new complaints.  Coming out of his room a little bit more.  Taking care of ADLs.  No actively bizarre behavior.  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 No pertinent surgical history. Family History:  Family History  Family history unknown: Yes   Family Psychiatric  History: See previous Social History:  Social History   Substance and Sexual Activity  Alcohol Use Not Currently     Social History   Substance and Sexual Activity  Drug Use Not Currently    Social History   Socioeconomic History   Marital status: Unknown    Spouse name: Not on file   Number of children: Not on file   Years of education: Not on file   Highest education level: Not on file  Occupational History   Not on file  Tobacco Use   Smoking status: Every Day    Packs/day: 0.25    Years: 15.00    Total pack years: 3.75    Types: Cigarettes   Smokeless tobacco: Not on file  Vaping Use   Vaping Use: Not on file  Substance and Sexual Activity   Alcohol use: Not Currently   Drug use: Not Currently   Sexual activity: Not Currently  Other Topics Concern   Not on file  Social History Narrative   Not on file   Social Determinants of Health   Financial Resource Strain: Not on file  Food Insecurity: No Food Insecurity (04/16/2022)   Hunger Vital Sign    Worried About Running Out of Food in the Last Year: Never true    Ran Out of  Food in the Last Year: Never true  Transportation Needs: No Transportation Needs (04/16/2022)   PRAPARE - Hydrologist (Medical): No    Lack of Transportation (Non-Medical): No  Physical Activity: Not on file  Stress: Not on file  Social Connections: Not on file   Additional Social History:                         Sleep: Fair  Appetite:  Fair  Current Medications: Current Facility-Administered Medications  Medication Dose Route Frequency Provider Last Rate Last Admin   acetaminophen (TYLENOL) tablet 650 mg  650 mg Oral Q6H PRN [name], NP       alum & mag hydroxide-simeth (MAALOX/MYLANTA) 200-200-20 MG/5ML suspension 30 mL  30 mL Oral Q4H PRN [name], NP       cloZAPine (CLOZARIL) tablet 150 mg  150 mg Oral QHS Aron Inge T, MD   150 mg at 04/27/22 2153   ziprasidone (GEODON) injection 20 mg  20 mg Intramuscular Q12H PRN [name], MD       And   LORazepam (ATIVAN) tablet 2 mg  2 mg Oral PRN [name], MD       magnesium hydroxide (MILK OF MAGNESIA) suspension  30 mL  30 mL Oral Daily PRN Vanetta Mulders, NP       nicotine polacrilex (NICORETTE) gum 2 mg  2 mg Oral PRN Daphane Odekirk, [name], MD   2 mg at 04/23/22 2200   OLANZapine (ZYPREXA) injection 10 mg  10 mg Intramuscular BID Dagmar Adcox, [name], MD   10 mg at 04/29/22 0830    Lab Results:  Results for orders placed or performed during the hospital encounter of 04/16/22 (from the past 48 hour(s))  Valproic acid level     Status: Abnormal   Collection Time: 04/28/22  8:57 AM  Result Value Ref Range   Valproic Acid Lvl 28 (L) 50.0 - 100.0 ug/mL    Comment: Performed at [facility], [address] [place], [place] [address]    Blood Alcohol level:  Lab Results  Component Value Date   [facility] <10 04/15/2022    Metabolic Disorder Labs: Lab Results  Component Value Date   HGBA1C 5.5 04/24/2022   MPG 111 04/24/2022   No results found for:  "PROLACTIN" Lab Results  Component Value Date   CHOL 220 (H) 04/24/2022   TRIG 700 (H) 04/24/2022   HDL 35 (L) 04/24/2022   CHOLHDL 6.3 04/24/2022   VLDL UNABLE TO CALCULATE IF TRIGLYCERIDE OVER 400 mg/dL 57/84/6962   LDLCALC UNABLE TO CALCULATE IF TRIGLYCERIDE OVER 400 mg/dL 95/28/4132    Physical Findings: AIMS: Facial and Oral Movements Muscles of Facial Expression: None, normal Lips and Perioral Area: None, normal Jaw: None, normal Tongue: None, normal,Extremity Movements Upper (arms, wrists, hands, fingers): None, normal Lower (legs, knees, ankles, toes): None, normal, Trunk Movements Neck, shoulders, hips: None, normal, Overall Severity Severity of abnormal movements (highest score from questions above): None, normal Incapacitation due to abnormal movements: None, normal Patient's awareness of abnormal movements (rate only patient's report): No Awareness, Dental Status Current problems with teeth and/or dentures?: No Does patient usually wear dentures?: No  CIWA:    COWS:     Musculoskeletal: Strength & Muscle Tone: within normal limits Gait & Station: normal Patient leans: N/A  Psychiatric Specialty Exam:  Presentation  General Appearance:  Disheveled  Eye Contact: Minimal  Speech: Pressured  Speech Volume: Normal  Handedness: Right   Mood and Affect  Mood: Euphoric; Irritable  Affect: Inappropriate   Thought Process  Thought Processes: Disorganized  Descriptions of Associations:Loose  Orientation:Partial  Thought Content:Illogical; Scattered  History of Schizophrenia/Schizoaffective disorder:No data recorded Duration of Psychotic Symptoms:No data recorded Hallucinations:No data recorded Ideas of Reference:Delusions; Paranoia  Suicidal Thoughts:No data recorded Homicidal Thoughts:No data recorded  Sensorium  Memory: Immediate Poor; Recent Poor; Remote Poor  Judgment: Poor  Insight: Poor   Executive Functions   Concentration: Fair  Attention Span: Fair  Recall: Poor  Fund of Knowledge: Poor  Language: Poor   Psychomotor Activity  Psychomotor Activity:No data recorded  Assets  Assets: Desire for Improvement; Social Support   Sleep  Sleep:No data recorded   Physical Exam: Physical Exam Vitals reviewed.  Constitutional:      Appearance: Normal appearance.  HENT:     Head: Normocephalic and atraumatic.     Mouth/Throat:     Pharynx: Oropharynx is clear.  Eyes:     Pupils: Pupils are equal, round, and reactive to light.  Cardiovascular:     Rate and Rhythm: Normal rate and regular rhythm.  Pulmonary:     Effort: Pulmonary effort is normal.     Breath sounds: Normal breath sounds.  Abdominal:  General: Abdomen is flat.     Palpations: Abdomen is soft.  Musculoskeletal:        General: Normal range of motion.  Skin:    General: Skin is warm and dry.  Neurological:     General: No focal deficit present.     Mental Status: He is alert. Mental status is at baseline.  Psychiatric:        Mood and Affect: Mood normal. Affect is blunt.        Speech: Speech is tangential.        Behavior: Behavior is withdrawn.        Thought Content: Thought content normal.    Review of Systems  Constitutional: Negative.   HENT: Negative.    Eyes: Negative.   Respiratory: Negative.    Cardiovascular: Negative.   Gastrointestinal: Negative.   Musculoskeletal: Negative.   Skin: Negative.   Neurological: Negative.   Psychiatric/Behavioral: Negative.     Blood pressure 128/79, pulse 95, temperature 98.2 F (36.8 C), temperature source Oral, resp. rate 18, height 5\' 9"  (1.753 m), weight 96.4 kg, SpO2 97 %. Body mass index is 31.38 kg/m.   Treatment Plan Summary: Medication management and Plan no change to medication.  Anticipate giving him a second shot of Invega soon at which time we can probably discontinue some of his other oral medicine and look towards discharge later  this week  , MD 04/29/2022, 12:08 PM

## 2022-04-29 NOTE — Progress Notes (Signed)
Patient appears irritable not receptive to staff, he didn't take evening medication he ws asleep.  No distress noted 15 minutes safety checks maintained will continue to monitor.

## 2022-04-29 NOTE — Plan of Care (Signed)
D- Patient alert and oriented. Patient presented in a pleasant mood on assessment stating that he slept good last night and had no complaints to voice to this Probation officer. Patient denied SI, HI, AVH, and pain at this time. Patient also denied any signs/symptoms of depression and anxiety stating that he feels an "8/10" mood wise. Patient also stated "if I wasn't so fat I'd feel alright". Patient had no stated goals for today.  A- Scheduled medications administered to patient, per MD orders. Support and encouragement provided.  Routine safety checks conducted every 15 minutes.  Patient informed to notify staff with problems or concerns.  R- No adverse drug reactions noted. Patient contracts for safety at this time. Patient compliant with medications and treatment plan. Patient receptive, calm, and cooperative. Patient isolates to room, except for meals and medication. Patient remains safe at this time.  Problem: Education: Goal: Knowledge of General Education information will improve Description: Including pain rating scale, medication(s)/side effects and non-pharmacologic comfort measures Outcome: Progressing   Problem: Health Behavior/Discharge Planning: Goal: Ability to manage health-related needs will improve Outcome: Progressing   Problem: Clinical Measurements: Goal: Ability to maintain clinical measurements within normal limits will improve Outcome: Progressing Goal: Will remain free from infection Outcome: Progressing Goal: Diagnostic test results will improve Outcome: Progressing Goal: Respiratory complications will improve Outcome: Progressing Goal: Cardiovascular complication will be avoided Outcome: Progressing   Problem: Activity: Goal: Risk for activity intolerance will decrease Outcome: Progressing   Problem: Nutrition: Goal: Adequate nutrition will be maintained Outcome: Progressing   Problem: Coping: Goal: Level of anxiety will decrease Outcome: Progressing   Problem:  Elimination: Goal: Will not experience complications related to bowel motility Outcome: Progressing Goal: Will not experience complications related to urinary retention Outcome: Progressing   Problem: Pain Managment: Goal: General experience of comfort will improve Outcome: Progressing   Problem: Safety: Goal: Ability to remain free from injury will improve Outcome: Progressing   Problem: Skin Integrity: Goal: Risk for impaired skin integrity will decrease Outcome: Progressing   Problem: Education: Goal: Knowledge of [place] [facility] Education information/materials will improve Outcome: Progressing Goal: Emotional status will improve Outcome: Progressing Goal: Mental status will improve Outcome: Progressing Goal: Verbalization of understanding the information provided will improve Outcome: Progressing   Problem: Activity: Goal: Interest or engagement in activities will improve Outcome: Progressing Goal: Sleeping patterns will improve Outcome: Progressing   Problem: Coping: Goal: Ability to verbalize frustrations and anger appropriately will improve Outcome: Progressing Goal: Ability to demonstrate self-control will improve Outcome: Progressing   Problem: Health Behavior/Discharge Planning: Goal: Identification of resources available to assist in meeting health care needs will improve Outcome: Progressing Goal: Compliance with treatment plan for underlying cause of condition will improve Outcome: Progressing   Problem: Physical Regulation: Goal: Ability to maintain clinical measurements within normal limits will improve Outcome: Progressing   Problem: Safety: Goal: Periods of time without injury will increase Outcome: Progressing   Problem: Activity: Goal: Will verbalize the importance of balancing activity with adequate rest periods Outcome: Progressing   Problem: Education: Goal: Will be free of psychotic symptoms Outcome: Progressing Goal: Knowledge  of the prescribed therapeutic regimen will improve Outcome: Progressing   Problem: Coping: Goal: Coping ability will improve Outcome: Progressing Goal: Will verbalize feelings Outcome: Progressing   Problem: Health Behavior/Discharge Planning: Goal: Compliance with prescribed medication regimen will improve Outcome: Progressing   Problem: Nutritional: Goal: Ability to achieve adequate nutritional intake will improve Outcome: Progressing   Problem: Role Relationship: Goal: Ability to communicate needs  accurately will improve Outcome: Progressing Goal: Ability to interact with others will improve Outcome: Progressing   Problem: Safety: Goal: Ability to redirect hostility and anger into socially appropriate behaviors will improve Outcome: Progressing Goal: Ability to remain free from injury will improve Outcome: Progressing   Problem: Self-Care: Goal: Ability to participate in self-care as condition permits will improve Outcome: Progressing   Problem: Self-Concept: Goal: Will verbalize positive feelings about self Outcome: Progressing

## 2022-04-29 NOTE — Progress Notes (Signed)
Recreation Therapy Notes    Date: 04/29/2022  Time: 10:00 am  Location: Craft room     Behavioral response: N/A   Intervention Topic: Coping Skills   Discussion/Intervention: Patient refused to attend group.   Clinical Observations/Feedback:  Patient refused to attend group.    Prisca Gearing LRT/CTRS          Mariacristina Aday 04/29/2022 12:59 PM

## 2022-04-30 DIAGNOSIS — F203 Undifferentiated schizophrenia: Secondary | ICD-10-CM | POA: Diagnosis not present

## 2022-04-30 NOTE — Progress Notes (Signed)
D- Patient alert and oriented x 2-3. Affect guarded/mood withdrawn . Denies SI/HI/ AVH.  He denies pain. States that his legs and feet feel "weird and weak". Gait slow and steady. A- Scheduled medications administered to patient, per MD orders. MD notified of complaints related to his lower legs and feet. Support and encouragement provided.  Routine safety checks conducted every 15 minutes.  Patient informed to notify staff with problems or concerns. R- No adverse drug reactions noted. Patient compliant with medications and treatment plan. Patient receptive, calm, and cooperative. He spent most of the day in his room. Patient remains safe at this time and contracts for safety.

## 2022-04-30 NOTE — Progress Notes (Signed)
Patient calm and pleasant during assessment denying SI/HI/AVH. Pt isoaltive to his room tonight but did come out for medication and snacks. Pt compliant with medication administration per MD orders. Pt given education, support, and encouragement to be acitve in his treatment plan. Pt being monitored Q 15 minutes for safety per unit protocol, remains safe on the unit  

## 2022-04-30 NOTE — Plan of Care (Signed)
?  Problem: Coping: ?Goal: Level of anxiety will decrease ?Outcome: Progressing ?  ?Problem: Safety: ?Goal: Ability to remain free from injury will improve ?Outcome: Progressing ?  ?

## 2022-04-30 NOTE — [facility] Counselor (Signed)
CSW team (Michaela S. & Miquel Dunn F.) met with Deloria Lair team members Nehemiah Settle P., Meda Coffee., and [place] [facility] along with pt father, Macio Kissoon. Disposition was discussed. Team was informed that father does not have a place for pt to discharge to. Group discussed options. [facility] informed team that this morning when she spoke with pt he denied any interest/intention of going to a group home. Team was made aware that pt is currently his own guardian but that his father is working on getting guardianship with DSS APS. Triumph provided description of pt's baseline which he described as multi-personalities. He shared that pt was in [facility] for a year prior to admission to the group home. Gumbs stated that the group home messed up and did not get pt's benefits re-instated and then began pressuring the pt to move out. He shared that when pt becomes overwhelmed/anxious then things fall apart. Clubb also shared that the provider through [employer] has written a letter that pt needs a guardian. Judithann Sheen meeting by talking about action plans.   Action plan stands as member for CSW team and Deloria Lair will search for placement and keep each other updated weekly (end of week). Bryan Lemma to send Deloria Lair copy of the letter from the doctor and keep them informed regarding guardianship. No other concerns expressed. Contact ended without incident.   Chalmers Guest. Guerry Bruin, MSW, [place], [place] 04/30/2022 3:19 PM

## 2022-04-30 NOTE — Group Note (Signed)
[facility] LCSW Group Therapy Note    Group Date: 04/30/2022 Start Time: 1300 End Time: 1400  Type of Therapy and Topic:  Group Therapy:  Overcoming Obstacles  Participation Level:  [facility] PARTICIPATION LEVEL: Did Not Attend  Mood:  Description of Group:   In this group patients will be encouraged to explore what they see as obstacles to their own wellness and recovery. They will be guided to discuss their thoughts, feelings, and behaviors related to these obstacles. The group will process together ways to cope with barriers, with attention given to specific choices patients can make. Each patient will be challenged to identify changes they are motivated to make in order to overcome their obstacles. This group will be process-oriented, with patients participating in exploration of their own experiences as well as giving and receiving support and challenge from other group members.  Therapeutic Goals: 1. Patient will identify personal and current obstacles as they relate to admission. 2. Patient will identify barriers that currently interfere with their wellness or overcoming obstacles.  3. Patient will identify feelings, thought process and behaviors related to these barriers. 4. Patient will identify two changes they are willing to make to overcome these obstacles:    Summary of Patient Progress   Patient declined to attend group, despite encouragement from this clinician.   Therapeutic Modalities:   Cognitive Behavioral Therapy Solution Focused Therapy Motivational Interviewing Relapse Prevention Therapy   Stefany Starace J Tommey Barret, LCSW 

## 2022-04-30 NOTE — Plan of Care (Signed)
  Problem: Education: Goal: Knowledge of General Education information will improve Description: Including pain rating scale, medication(s)/side effects and non-pharmacologic comfort measures Outcome: Not Progressing   Problem: Health Behavior/Discharge Planning: Goal: Ability to manage health-related needs will improve Outcome: Not Progressing   Problem: Clinical Measurements: Goal: Ability to maintain clinical measurements within normal limits will improve Outcome: Not Progressing Goal: Will remain free from infection Outcome: Not Progressing Goal: Diagnostic test results will improve Outcome: Not Progressing Goal: Respiratory complications will improve Outcome: Not Progressing Goal: Cardiovascular complication will be avoided Outcome: Not Progressing   Problem: Activity: Goal: Risk for activity intolerance will decrease Outcome: Not Progressing   Problem: Nutrition: Goal: Adequate nutrition will be maintained Outcome: Not Progressing   Problem: Coping: Goal: Level of anxiety will decrease Outcome: Not Progressing   Problem: Elimination: Goal: Will not experience complications related to bowel motility Outcome: Not Progressing Goal: Will not experience complications related to urinary retention Outcome: Not Progressing   Problem: Pain Managment: Goal: General experience of comfort will improve Outcome: Not Progressing   Problem: Safety: Goal: Ability to remain free from injury will improve Outcome: Not Progressing   Problem: Skin Integrity: Goal: Risk for impaired skin integrity will decrease Outcome: Not Progressing   Problem: Education: Goal: Knowledge of [place] General Education information/materials will improve Outcome: Not Progressing Goal: Emotional status will improve Outcome: Not Progressing Goal: Mental status will improve Outcome: Not Progressing Goal: Verbalization of understanding the information provided will improve Outcome: Not  Progressing   Problem: Activity: Goal: Interest or engagement in activities will improve Outcome: Not Progressing Goal: Sleeping patterns will improve Outcome: Not Progressing   Problem: Coping: Goal: Ability to verbalize frustrations and anger appropriately will improve Outcome: Not Progressing Goal: Ability to demonstrate self-control will improve Outcome: Not Progressing   Problem: Health Behavior/Discharge Planning: Goal: Identification of resources available to assist in meeting health care needs will improve Outcome: Not Progressing Goal: Compliance with treatment plan for underlying cause of condition will improve Outcome: Not Progressing   Problem: Physical Regulation: Goal: Ability to maintain clinical measurements within normal limits will improve Outcome: Not Progressing   Problem: Safety: Goal: Periods of time without injury will increase Outcome: Not Progressing   Problem: Activity: Goal: Will verbalize the importance of balancing activity with adequate rest periods Outcome: Not Progressing   Problem: Education: Goal: Will be free of psychotic symptoms Outcome: Not Progressing Goal: Knowledge of the prescribed therapeutic regimen will improve Outcome: Not Progressing   Problem: Coping: Goal: Coping ability will improve Outcome: Not Progressing Goal: Will verbalize feelings Outcome: Not Progressing   Problem: Health Behavior/Discharge Planning: Goal: Compliance with prescribed medication regimen will improve Outcome: Not Progressing   Problem: Nutritional: Goal: Ability to achieve adequate nutritional intake will improve Outcome: Not Progressing   Problem: Role Relationship: Goal: Ability to communicate needs accurately will improve Outcome: Not Progressing Goal: Ability to interact with others will improve Outcome: Not Progressing   Problem: Safety: Goal: Ability to redirect hostility and anger into socially appropriate behaviors will  improve Outcome: Not Progressing Goal: Ability to remain free from injury will improve Outcome: Not Progressing   Problem: Self-Care: Goal: Ability to participate in self-care as condition permits will improve Outcome: Not Progressing   Problem: Self-Concept: Goal: Will verbalize positive feelings about self Outcome: Not Progressing   

## 2022-04-30 NOTE — Progress Notes (Signed)
[facility] MD Progress Note  04/30/2022 4:16 PM Travis Sandoval  MRN:  [id number] Subjective: Follow-up patient with schizophrenia.  Patient was mostly in bed today.  Seems to be pretty sedated and this has been noticed over the last day or 2.  We had been hoping to look for discharge soon but unfortunately it sounds like his group home is unwilling to take him back because of financial problems.  He still has some psychotic thinking if questioned about it but has become much calmer and easier to get along with. Principal Problem: Schizophrenia ([place]) Diagnosis: Principal Problem:   Schizophrenia ([place]) Active Problems:   Psychosis ([place])  Total Time spent with patient: 30 minutes  Past Psychiatric History: Past history of schizophrenia  Past Medical History:  Past Medical History:  Diagnosis Date   ADHD    Bipolar 1 disorder ([place])    Hepatitis C    Schizoaffective disorder ([place])    History reviewed. No pertinent surgical history. Family History:  Family History  Family history unknown: Yes   Family Psychiatric  History: See previous Social History:  Social History   Substance and Sexual Activity  Alcohol Use Not Currently     Social History   Substance and Sexual Activity  Drug Use Not Currently    Social History   Socioeconomic History   Marital status: Unknown    Spouse name: Not on file   Number of children: Not on file   Years of education: Not on file   Highest education level: Not on file  Occupational History   Not on file  Tobacco Use   Smoking status: Every Day    Packs/day: 0.25    Years: 15.00    Total pack years: 3.75    Types: Cigarettes   Smokeless tobacco: Not on file  Vaping Use   Vaping Use: Not on file  Substance and Sexual Activity   Alcohol use: Not Currently   Drug use: Not Currently   Sexual activity: Not Currently  Other Topics Concern   Not on file  Social History Narrative   Not on file   Social Determinants of Health   Financial  Resource Strain: Not on file  Food Insecurity: No Food Insecurity (04/16/2022)   Hunger Vital Sign    Worried About Running Out of Food in the Last Year: Never true    Ran Out of Food in the Last Year: Never true  Transportation Needs: No Transportation Needs (04/16/2022)   PRAPARE - Hydrologist (Medical): No    Lack of Transportation (Non-Medical): No  Physical Activity: Not on file  Stress: Not on file  Social Connections: Not on file   Additional Social History:                         Sleep: Fair  Appetite:  Fair  Current Medications: Current Facility-Administered Medications  Medication Dose Route Frequency Provider Last Rate Last Admin   acetaminophen (TYLENOL) tablet 650 mg  650 mg Oral Q6H PRN [name], NP       alum & mag hydroxide-simeth (MAALOX/MYLANTA) 200-200-20 MG/5ML suspension 30 mL  30 mL Oral Q4H PRN [name], NP       cloZAPine (CLOZARIL) tablet 150 mg  150 mg Oral QHS Natasha Burda T, MD   150 mg at 04/29/22 2123   ziprasidone (GEODON) injection 20 mg  20 mg Intramuscular Q12H PRN [name], MD  And   LORazepam (ATIVAN) tablet 2 mg  2 mg Oral PRN Reggie Pile, MD       magnesium hydroxide (MILK OF MAGNESIA) suspension 30 mL  30 mL Oral Daily PRN Gabriel Cirri F, NP       nicotine polacrilex (NICORETTE) gum 2 mg  2 mg Oral PRN Kazim Corrales, [name], MD   2 mg at 04/23/22 2200    Lab Results: No results found for this or any previous visit (from the past 48 hour(s)).  Blood Alcohol level:  Lab Results  Component Value Date   ETH <10 04/15/2022    Metabolic Disorder Labs: Lab Results  Component Value Date   HGBA1C 5.5 04/24/2022   MPG 111 04/24/2022   No results found for: "PROLACTIN" Lab Results  Component Value Date   CHOL 220 (H) 04/24/2022   TRIG 700 (H) 04/24/2022   HDL 35 (L) 04/24/2022   CHOLHDL 6.3 04/24/2022   VLDL UNABLE TO CALCULATE IF TRIGLYCERIDE OVER 400 mg/dL  85/46/2703   LDLCALC UNABLE TO CALCULATE IF TRIGLYCERIDE OVER 400 mg/dL 50/12/3816    Physical Findings: AIMS: Facial and Oral Movements Muscles of Facial Expression: None, normal Lips and Perioral Area: None, normal Jaw: None, normal Tongue: None, normal,Extremity Movements Upper (arms, wrists, hands, fingers): None, normal Lower (legs, knees, ankles, toes): None, normal, Trunk Movements Neck, shoulders, hips: None, normal, Overall Severity Severity of abnormal movements (highest score from questions above): None, normal Incapacitation due to abnormal movements: None, normal Patient's awareness of abnormal movements (rate only patient's report): No Awareness, Dental Status Current problems with teeth and/or dentures?: No Does patient usually wear dentures?: No  CIWA:    COWS:     Musculoskeletal: Strength & Muscle Tone: within normal limits Gait & Station: normal Patient leans: N/A  Psychiatric Specialty Exam:  Presentation  General Appearance:  Disheveled  Eye Contact: Minimal  Speech: Pressured  Speech Volume: Normal  Handedness: Right   Mood and Affect  Mood: Euphoric; Irritable  Affect: Inappropriate   Thought Process  Thought Processes: Disorganized  Descriptions of Associations:Loose  Orientation:Partial  Thought Content:Illogical; Scattered  History of Schizophrenia/Schizoaffective disorder:No data recorded Duration of Psychotic Symptoms:No data recorded Hallucinations:No data recorded Ideas of Reference:Delusions; Paranoia  Suicidal Thoughts:No data recorded Homicidal Thoughts:No data recorded  Sensorium  Memory: Immediate Poor; Recent Poor; Remote Poor  Judgment: Poor  Insight: Poor   Executive Functions  Concentration: Fair  Attention Span: Fair  Recall: Poor  Fund of Knowledge: Poor  Language: Poor   Psychomotor Activity  Psychomotor Activity:No data recorded  Assets  Assets: Desire for Improvement;  Social Support   Sleep  Sleep:No data recorded   Physical Exam: Physical Exam Vitals and nursing note reviewed.  Constitutional:      Appearance: Normal appearance.  HENT:     Head: Normocephalic and atraumatic.     Mouth/Throat:     Pharynx: Oropharynx is clear.  Eyes:     Pupils: Pupils are equal, round, and reactive to light.  Cardiovascular:     Rate and Rhythm: Normal rate and regular rhythm.  Pulmonary:     Effort: Pulmonary effort is normal.     Breath sounds: Normal breath sounds.  Abdominal:     General: Abdomen is flat.     Palpations: Abdomen is soft.  Musculoskeletal:        General: Normal range of motion.  Skin:    General: Skin is warm and dry.  Neurological:     General: No focal  deficit present.     Mental Status: He is alert. Mental status is at baseline.  Psychiatric:        Attention and Perception: He is inattentive.        Mood and Affect: Mood normal. Affect is blunt.        Speech: Speech is delayed.        Behavior: Behavior is withdrawn.        Thought Content: Thought content is delusional.    Review of Systems  Constitutional: Negative.   HENT: Negative.    Eyes: Negative.   Respiratory: Negative.    Cardiovascular: Negative.   Gastrointestinal: Negative.   Musculoskeletal: Negative.   Skin: Negative.   Neurological: Negative.   Psychiatric/Behavioral: Negative.     Blood pressure 105/88, pulse (!) 104, temperature 97.9 F (36.6 C), temperature source Oral, resp. rate 18, height 5\' 9"  (1.753 m), weight 96.4 kg, SpO2 97 %. Body mass index is 31.38 kg/m.   Treatment Plan Summary: Medication management and Plan patient got his first Invega shot 3 days ago.  Too early to give the second 1.  I am going to discontinue the olanzapine now however since he looks like he is over sedated and it may be that we can transition to just the intramuscular Invega.  Encourage patient to be more interactive.  Meanwhile treatment team is working on  communicating with his outpatient team to find appropriate discharge options.  , MD 04/30/2022, 4:16 PM

## 2022-04-30 NOTE — Progress Notes (Signed)
Patient sleep beginning of early shift, then up for extended period of time. Isolative to self. Medication compliant. Appropriate with staff. No interaction with peers. Denies all. Forwards minimal. Encouragement and support provided. Safety checks maintained. Medications given as prescribed. Pt receptive and remains safe on unit with q 15 min checks.

## 2022-05-01 DIAGNOSIS — F203 Undifferentiated schizophrenia: Secondary | ICD-10-CM | POA: Diagnosis not present

## 2022-05-01 LAB — CBC WITH DIFFERENTIAL/PLATELET
Abs Immature Granulocytes: 0.04 10*3/uL (ref 0.00–0.07)
Basophils Absolute: 0 10*3/uL (ref 0.0–0.1)
Basophils Relative: 1 %
Eosinophils Absolute: 0.1 10*3/uL (ref 0.0–0.5)
Eosinophils Relative: 2 %
HCT: 41.7 % (ref 39.0–52.0)
Hemoglobin: 14.2 g/dL (ref 13.0–17.0)
Immature Granulocytes: 1 %
Lymphocytes Relative: 39 %
Lymphs Abs: 2.6 10*3/uL (ref 0.7–4.0)
MCH: 31.4 pg (ref 26.0–34.0)
MCHC: 34.1 g/dL (ref 30.0–36.0)
MCV: 92.3 fL (ref 80.0–100.0)
Monocytes Absolute: 0.8 10*3/uL (ref 0.1–1.0)
Monocytes Relative: 12 %
Neutro Abs: 3.1 10*3/uL (ref 1.7–7.7)
Neutrophils Relative %: 45 %
Platelets: 202 10*3/uL (ref 150–400)
RBC: 4.52 MIL/uL (ref 4.22–5.81)
RDW: 12.2 % (ref 11.5–15.5)
WBC: 6.7 10*3/uL (ref 4.0–10.5)
nRBC: 0 % (ref 0.0–0.2)

## 2022-05-01 NOTE — Progress Notes (Signed)
[facility] MD Progress Note  05/01/2022 5:15 PM Travis Sandoval  MRN:  [id number] Subjective: Follow-up 42 year old man with schizophrenia.  Patient seen and chart reviewed.  Decent eye contact when he wakes up.  Still does not come out and interact much.  He was aware of our situation where we can discharge him because of funding.  Seems a little less sedated today my having cut back on the olanzapine Principal Problem: Schizophrenia ([place]) Diagnosis: Principal Problem:   Schizophrenia ([place]) Active Problems:   Psychosis ([place])  Total Time spent with patient: 30 minutes  Past Psychiatric History: Past history of recurrent psychotic disorder  Past Medical History:  Past Medical History:  Diagnosis Date   ADHD    Bipolar 1 disorder ([place])    Hepatitis C    Schizoaffective disorder ([place])    History reviewed. No pertinent surgical history. Family History:  Family History  Family history unknown: Yes   Family Psychiatric  History: See previous Social History:  Social History   Substance and Sexual Activity  Alcohol Use Not Currently     Social History   Substance and Sexual Activity  Drug Use Not Currently    Social History   Socioeconomic History   Marital status: Unknown    Spouse name: Not on file   Number of children: Not on file   Years of education: Not on file   Highest education level: Not on file  Occupational History   Not on file  Tobacco Use   Smoking status: Every Day    Packs/day: 0.25    Years: 15.00    Total pack years: 3.75    Types: Cigarettes   Smokeless tobacco: Not on file  Vaping Use   Vaping Use: Not on file  Substance and Sexual Activity   Alcohol use: Not Currently   Drug use: Not Currently   Sexual activity: Not Currently  Other Topics Concern   Not on file  Social History Narrative   Not on file   Social Determinants of Health   Financial Resource Strain: Not on file  Food Insecurity: No Food Insecurity (04/16/2022)   Hunger Vital Sign     Worried About Running Out of Food in the Last Year: Never true    Ran Out of Food in the Last Year: Never true  Transportation Needs: No Transportation Needs (04/16/2022)   PRAPARE - Hydrologist (Medical): No    Lack of Transportation (Non-Medical): No  Physical Activity: Not on file  Stress: Not on file  Social Connections: Not on file   Additional Social History:                         Sleep: Fair  Appetite:  Fair  Current Medications: Current Facility-Administered Medications  Medication Dose Route Frequency Provider Last Rate Last Admin   acetaminophen (TYLENOL) tablet 650 mg  650 mg Oral Q6H PRN [name], NP       alum & mag hydroxide-simeth (MAALOX/MYLANTA) 200-200-20 MG/5ML suspension 30 mL  30 mL Oral Q4H PRN [name], NP       cloZAPine (CLOZARIL) tablet 150 mg  150 mg Oral QHS Lawson Isabell T, MD   150 mg at 04/30/22 2127   ziprasidone (GEODON) injection 20 mg  20 mg Intramuscular Q12H PRN [name], MD       And   LORazepam (ATIVAN) tablet 2 mg  2 mg Oral PRN [name],  MD       magnesium hydroxide (MILK OF MAGNESIA) suspension 30 mL  30 mL Oral Daily PRN Vanetta Mulders, NP       nicotine polacrilex (NICORETTE) gum 2 mg  2 mg Oral PRN Moshe Wenger, [name], MD   2 mg at 04/23/22 2200    Lab Results:  Results for orders placed or performed during the hospital encounter of 04/16/22 (from the past 48 hour(s))  CBC with Differential/Platelet     Status: None   Collection Time: 05/01/22  6:56 AM  Result Value Ref Range   WBC 6.7 4.0 - 10.5 K/uL   RBC 4.52 4.22 - 5.81 MIL/uL   Hemoglobin 14.2 13.0 - 17.0 g/dL   HCT 59.5 63.8 - 75.6 %   MCV 92.3 80.0 - 100.0 fL   MCH 31.4 26.0 - 34.0 pg   MCHC 34.1 30.0 - 36.0 g/dL   RDW 43.3 29.5 - 18.8 %   Platelets 202 150 - 400 K/uL   nRBC 0.0 0.0 - 0.2 %   Neutrophils Relative % 45 %   Neutro Abs 3.1 1.7 - 7.7 K/uL   Lymphocytes Relative 39 %   Lymphs Abs 2.6  0.7 - 4.0 K/uL   Monocytes Relative 12 %   Monocytes Absolute 0.8 0.1 - 1.0 K/uL   Eosinophils Relative 2 %   Eosinophils Absolute 0.1 0.0 - 0.5 K/uL   Basophils Relative 1 %   Basophils Absolute 0.0 0.0 - 0.1 K/uL   Immature Granulocytes 1 %   Abs Immature Granulocytes 0.04 0.00 - 0.07 K/uL    Comment: Performed at [facility], [address] [place], [place] [address]    Blood Alcohol level:  Lab Results  Component Value Date   [facility] <10 04/15/2022    Metabolic Disorder Labs: Lab Results  Component Value Date   HGBA1C 5.5 04/24/2022   MPG 111 04/24/2022   No results found for: "PROLACTIN" Lab Results  Component Value Date   CHOL 220 (H) 04/24/2022   TRIG 700 (H) 04/24/2022   HDL 35 (L) 04/24/2022   CHOLHDL 6.3 04/24/2022   VLDL UNABLE TO CALCULATE IF TRIGLYCERIDE OVER 400 mg/dL 63/04/6008   LDLCALC UNABLE TO CALCULATE IF TRIGLYCERIDE OVER 400 mg/dL 93/23/5573    Physical Findings: AIMS: Facial and Oral Movements Muscles of Facial Expression: None, normal Lips and Perioral Area: None, normal Jaw: None, normal Tongue: None, normal,Extremity Movements Upper (arms, wrists, hands, fingers): None, normal Lower (legs, knees, ankles, toes): None, normal, Trunk Movements Neck, shoulders, hips: None, normal, Overall Severity Severity of abnormal movements (highest score from questions above): None, normal Incapacitation due to abnormal movements: None, normal Patient's awareness of abnormal movements (rate only patient's report): No Awareness, Dental Status Current problems with teeth and/or dentures?: No Does patient usually wear dentures?: No  CIWA:    COWS:     Musculoskeletal: Strength & Muscle Tone: within normal limits Gait & Station: normal Patient leans: N/A  Psychiatric Specialty Exam:  Presentation  General Appearance:  Disheveled  Eye Contact: Minimal  Speech: Pressured  Speech Volume: Normal  Handedness: Right   Mood and Affect   Mood: Euphoric; Irritable  Affect: Inappropriate   Thought Process  Thought Processes: Disorganized  Descriptions of Associations:Loose  Orientation:Partial  Thought Content:Illogical; Scattered  History of Schizophrenia/Schizoaffective disorder:No data recorded Duration of Psychotic Symptoms:No data recorded Hallucinations:No data recorded Ideas of Reference:Delusions; Paranoia  Suicidal Thoughts:No data recorded Homicidal Thoughts:No data recorded  Sensorium  Memory: Immediate Poor;  Recent Poor; Remote Poor  Judgment: Poor  Insight: Poor   Executive Functions  Concentration: Fair  Attention Span: Fair  Recall: Poor  Fund of Knowledge: Poor  Language: Poor   Psychomotor Activity  Psychomotor Activity:No data recorded  Assets  Assets: Desire for Improvement; Social Support   Sleep  Sleep:No data recorded   Physical Exam: Physical Exam Vitals and nursing note reviewed.  Constitutional:      Appearance: Normal appearance.  HENT:     Head: Normocephalic and atraumatic.     Mouth/Throat:     Pharynx: Oropharynx is clear.  Eyes:     Pupils: Pupils are equal, round, and reactive to light.  Cardiovascular:     Rate and Rhythm: Normal rate and regular rhythm.  Pulmonary:     Effort: Pulmonary effort is normal.     Breath sounds: Normal breath sounds.  Abdominal:     General: Abdomen is flat.     Palpations: Abdomen is soft.  Musculoskeletal:        General: Normal range of motion.  Skin:    General: Skin is warm and dry.  Neurological:     General: No focal deficit present.     Mental Status: He is alert. Mental status is at baseline.  Psychiatric:        Attention and Perception: Attention normal.        Mood and Affect: Mood normal. Affect is blunt.        Speech: Speech is delayed.        Behavior: Behavior is slowed.        Thought Content: Thought content is delusional.    Review of Systems  Constitutional: Negative.    HENT: Negative.    Eyes: Negative.   Respiratory: Negative.    Cardiovascular: Negative.   Gastrointestinal: Negative.   Musculoskeletal: Negative.   Skin: Negative.   Neurological: Negative.   Psychiatric/Behavioral: Negative.     Blood pressure 118/81, pulse (!) 121, temperature (!) 97.5 F (36.4 C), temperature source Oral, resp. rate 18, height 5\' 9"  (1.753 m), weight 96.4 kg, SpO2 97 %. Body mass index is 31.38 kg/m.   Treatment Plan Summary: Plan we will probably be doing the second Invega shot in the next 1 or 2 days.  Not a great rush given that it looks like we are stuck with discharge planning now.  Encourage patient to be attending groups.  Alethia Berthold, MD 05/01/2022, 5:15 PM

## 2022-05-01 NOTE — Progress Notes (Signed)
Recreation Therapy Notes  Date: 05/01/2022  Time: 10:50 am  Location: Craft room     Behavioral response: N/A   Intervention Topic: Time Management   Discussion/Intervention: Patient refused to attend group.   Clinical Observations/Feedback:  Patient refused to attend group.    Travis Sandoval LRT/CTRS        [name] 05/01/2022 2:00 PM

## 2022-05-01 NOTE — Plan of Care (Signed)
  Problem: Education: Goal: Knowledge of General Education information will improve Description: Including pain rating scale, medication(s)/side effects and non-pharmacologic comfort measures Outcome: Progressing   Problem: Health Behavior/Discharge Planning: Goal: Ability to manage health-related needs will improve Outcome: Progressing   Problem: Clinical Measurements: Goal: Ability to maintain clinical measurements within normal limits will improve Outcome: Progressing Goal: Will remain free from infection Outcome: Progressing Goal: Diagnostic test results will improve Outcome: Progressing Goal: Respiratory complications will improve Outcome: Progressing Goal: Cardiovascular complication will be avoided Outcome: Progressing   Problem: Activity: Goal: Risk for activity intolerance will decrease Outcome: Progressing   Problem: Nutrition: Goal: Adequate nutrition will be maintained Outcome: Progressing   Problem: Coping: Goal: Level of anxiety will decrease Outcome: Progressing   Problem: Elimination: Goal: Will not experience complications related to bowel motility Outcome: Progressing Goal: Will not experience complications related to urinary retention Outcome: Progressing   Problem: Pain Managment: Goal: General experience of comfort will improve Outcome: Progressing   Problem: Safety: Goal: Ability to remain free from injury will improve Outcome: Progressing   Problem: Skin Integrity: Goal: Risk for impaired skin integrity will decrease Outcome: Progressing   Problem: Education: Goal: Knowledge of [place] General Education information/materials will improve Outcome: Progressing Goal: Emotional status will improve Outcome: Progressing Goal: Mental status will improve Outcome: Progressing Goal: Verbalization of understanding the information provided will improve Outcome: Progressing   Problem: Activity: Goal: Interest or engagement in activities will  improve Outcome: Progressing Goal: Sleeping patterns will improve Outcome: Progressing   Problem: Coping: Goal: Ability to verbalize frustrations and anger appropriately will improve Outcome: Progressing Goal: Ability to demonstrate self-control will improve Outcome: Progressing   Problem: Health Behavior/Discharge Planning: Goal: Identification of resources available to assist in meeting health care needs will improve Outcome: Progressing Goal: Compliance with treatment plan for underlying cause of condition will improve Outcome: Progressing   Problem: Physical Regulation: Goal: Ability to maintain clinical measurements within normal limits will improve Outcome: Progressing   Problem: Safety: Goal: Periods of time without injury will increase Outcome: Progressing   Problem: Activity: Goal: Will verbalize the importance of balancing activity with adequate rest periods Outcome: Progressing   Problem: Education: Goal: Will be free of psychotic symptoms Outcome: Progressing Goal: Knowledge of the prescribed therapeutic regimen will improve Outcome: Progressing   Problem: Coping: Goal: Coping ability will improve Outcome: Progressing Goal: Will verbalize feelings Outcome: Progressing   Problem: Health Behavior/Discharge Planning: Goal: Compliance with prescribed medication regimen will improve Outcome: Progressing   Problem: Nutritional: Goal: Ability to achieve adequate nutritional intake will improve Outcome: Progressing   Problem: Role Relationship: Goal: Ability to communicate needs accurately will improve Outcome: Progressing Goal: Ability to interact with others will improve Outcome: Progressing   Problem: Safety: Goal: Ability to redirect hostility and anger into socially appropriate behaviors will improve Outcome: Progressing Goal: Ability to remain free from injury will improve Outcome: Progressing   Problem: Self-Care: Goal: Ability to participate  in self-care as condition permits will improve Outcome: Progressing   Problem: Self-Concept: Goal: Will verbalize positive feelings about self Outcome: Progressing   

## 2022-05-01 NOTE — Group Note (Signed)
[facility] LCSW Group Therapy Note   Group Date: 05/01/2022 Start Time: 1300 End Time: 1400   Type of Therapy/Topic:  Group Therapy:  Emotion Regulation  Participation Level:  Did Not Attend    Description of Group:    The purpose of this group is to assist patients in learning to regulate negative emotions and experience positive emotions. Patients will be guided to discuss ways in which they have been vulnerable to their negative emotions. These vulnerabilities will be juxtaposed with experiences of positive emotions or situations, and patients challenged to use positive emotions to combat negative ones. Special emphasis will be placed on coping with negative emotions in conflict situations, and patients will process healthy conflict resolution skills.  Therapeutic Goals: Patient will identify two positive emotions or experiences to reflect on in order to balance out negative emotions:  Patient will label two or more emotions that they find the most difficult to experience:  Patient will be able to demonstrate positive conflict resolution skills through discussion or role plays:   Summary of Patient Progress: X   Therapeutic Modalities:   Cognitive Behavioral Therapy Feelings Identification Dialectical Behavioral Therapy   Travis Steven R Shirah Roseman, LCSW 

## 2022-05-01 NOTE — [facility] Group Notes (Signed)
[facility] Group Notes:  (Nursing/MHT/Case Management/Adjunct)  Date:  05/01/2022  Time:  9:55 PM  Type of Therapy:   Wrap up  Participation Level:  Did Not Attend  Summary of Progress/Problems:  Travis Sandoval 05/01/2022, 9:55 PM

## 2022-05-01 NOTE — Progress Notes (Signed)
D- Patient alert and oriented x 2-3. Affect flat/mood isolative. Denies SI/ HI/ AVH. He denies pain. He is asking "how discharge is doing". Instructed on discontinued Zyprexa and Valproic Acid but will continue Clozaril. Instructed on labs drawn today- WNL. A- No scheduled medications this shift per MD orders. Support and encouragement provided.  Routine safety checks conducted every 15 minutes without incident.  Patient informed to notify staff with problems or concerns. R- No adverse drug reactions noted- labs WNL. Patient compliant with medications and treatment plan. Patient receptive, calm, and cooperative. Patient isolative in room during the shift. He contracts for safety and remains safe on the unit at this time.

## 2022-05-01 NOTE — Consult Note (Signed)
Pharmacy Consult - Clozapine     42 yo male ordered clozapine 100 mg PO daily  This patient's order has been reviewed for prescribing contraindications.    Clozapine [facility] enrollment Verified: yes on 04/17/22 [facility] patient ID: [id number] Current Outpatient Monitoring: Every 2 weeks   Home Regimen:  225 mg po BID Last dose: unknown   Dose Adjustments This Admission: Dose started at clozapine 100 mg po daily   Labs: Date    ANC    Submitted? 12/27 2800 Yes 01/03 3500 Yes 01/10   3100 Yes     Plan: Continue clozapine 100 mg po daily Monitor ANC at least weekly while inpatient     Glean Salvo, PharmD, BCPS Clinical Pharmacist  05/01/2022 11:51 AM

## 2022-05-02 DIAGNOSIS — F203 Undifferentiated schizophrenia: Secondary | ICD-10-CM | POA: Diagnosis not present

## 2022-05-02 MED ORDER — PALIPERIDONE PALMITATE ER 156 MG/ML IM SUSY
156.0000 mg | PREFILLED_SYRINGE | Freq: Once | INTRAMUSCULAR | Status: AC
  Administered 2022-05-03: 156 mg via INTRAMUSCULAR
  Filled 2022-05-02: qty 1

## 2022-05-02 NOTE — [facility] Group Notes (Signed)
[facility] Group Notes:  (Nursing/MHT/Case Management/Adjunct)  Date:  05/02/2022  Time:  3:14 PM  Type of Therapy:  Psychoeducational Skills  Participation Level:  Did Not Attend   Adela Lank [facility] 05/02/2022, 3:14 PM

## 2022-05-02 NOTE — Progress Notes (Signed)
Patient calm and pleasant during assessment denying SI/HI/AVH. Pt isoaltive to his room tonight but did come out for medication and snacks. Pt compliant with medication administration per MD orders. Pt given education, support, and encouragement to be acitve in his treatment plan. Pt being monitored Q 15 minutes for safety per unit protocol, remains safe on the unit  

## 2022-05-02 NOTE — Plan of Care (Signed)
  Problem: Education: Goal: Knowledge of General Education information will improve Description: Including pain rating scale, medication(s)/side effects and non-pharmacologic comfort measures Outcome: Progressing   Problem: Education: Goal: Emotional status will improve Outcome: Progressing   Problem: Activity: Goal: Interest or engagement in activities will improve Outcome: Not Progressing

## 2022-05-02 NOTE — BH IP Treatment Plan (Signed)
Interdisciplinary Treatment and Diagnostic Plan Update  05/02/2022 Time of Session: 8:30AM Travis Sandoval MRN: [id number]  Principal Diagnosis: Schizophrenia [facility])  Secondary Diagnoses: Principal Problem:   Schizophrenia ([place]) Active Problems:   Psychosis ([place])   Current Medications:  Current Facility-Administered Medications  Medication Dose Route Frequency Provider Last Rate Last Admin   acetaminophen (TYLENOL) tablet 650 mg  650 mg Oral Q6H PRN [name], NP       alum & mag hydroxide-simeth (MAALOX/MYLANTA) 200-200-20 MG/5ML suspension 30 mL  30 mL Oral Q4H PRN [name], NP       cloZAPine (CLOZARIL) tablet 150 mg  150 mg Oral QHS [name], [name], MD   150 mg at 05/01/22 2135   ziprasidone (GEODON) injection 20 mg  20 mg Intramuscular Q12H PRN [name], MD       And   LORazepam (ATIVAN) tablet 2 mg  2 mg Oral PRN [name], MD       magnesium hydroxide (MILK OF MAGNESIA) suspension 30 mL  30 mL Oral Daily PRN [name], NP       nicotine polacrilex (NICORETTE) gum 2 mg  2 mg Oral PRN [name], [name], MD   2 mg at 04/23/22 2200   PTA Medications: Medications Prior to Admission  Medication Sig Dispense Refill Last Dose   ABILIFY MAINTENA 400 MG SRER injection Inject 400 mg into the muscle every 28 (twenty-eight) days.      ARIPiprazole (ABILIFY) 10 MG tablet Take 10 mg by mouth daily. (Patient not taking: Reported on 04/16/2022)      atomoxetine (STRATTERA) 40 MG capsule Take 40 mg by mouth daily. (Patient not taking: Reported on 04/16/2022)      atorvastatin (LIPITOR) 40 MG tablet Take 40 mg by mouth daily.      budesonide-formoterol (SYMBICORT) 160-4.5 MCG/ACT inhaler Inhale 2 puffs into the lungs.      cetirizine (ZYRTEC) 10 MG tablet Take 10 mg by mouth daily.      clozapine (CLOZARIL) 200 MG tablet Take 200 mg by mouth 2 (two) times daily. Take along with one 25 mg tablet for total 225 mg twice daily      cloZAPine (CLOZARIL) 25 MG tablet  Take 25 mg by mouth 2 (two) times daily. Take along with one 200 mg tablet for total 225 mg twice daily      Dexlansoprazole (DEXILANT) 30 MG capsule Take 30 mg by mouth daily. (Patient not taking: Reported on 04/16/2022)      hydrOXYzine (VISTARIL) 50 MG capsule Take 50 mg by mouth 3 (three) times daily as needed for itching. (Patient not taking: Reported on 04/16/2022)      Melatonin 5 MG TABS Take 5 mg by mouth at bedtime as needed (sleep).      metoprolol succinate (TOPROL-XL) 25 MG 24 hr tablet Take 12.5 mg by mouth daily.      Phosphatidyl Choline 65 MG TABS Take 1 tablet by mouth daily.  (Patient not taking: Reported on 04/16/2022)      valproic acid (DEPAKENE) 250 MG/5ML solution Take 750-1,000 mLs by mouth 2 (two) times daily. 1000 mg (20 mL) every morning and 750 mg (15 mL) daily at bedtime       Patient Stressors:    Patient Strengths:    Treatment Modalities: Medication Management, Group therapy, Case management,  1 to 1 session with clinician, Psychoeducation, Recreational therapy.   Physician Treatment Plan for Primary Diagnosis: Schizophrenia [facility]) Long Term Goal(s):  Short Term Goals: None, pt is a poor hx.  Medication Management: Evaluate patient's response, side effects, and tolerance of medication regimen.  Therapeutic Interventions: 1 to 1 sessions, Unit Group sessions and Medication administration.  Evaluation of Outcomes: Progressing  Physician Treatment Plan for Secondary Diagnosis: Principal Problem:   Schizophrenia (HCC) Active Problems:   Psychosis (HCC)  Long Term Goal(s):     Short Term Goals: None, pt is a poor hx.     Medication Management: Evaluate patient's response, side effects, and tolerance of medication regimen.  Therapeutic Interventions: 1 to 1 sessions, Unit Group sessions and Medication administration.  Evaluation of Outcomes: Progressing   RN Treatment Plan for Primary Diagnosis: Schizophrenia (HCC) Long Term Goal(s): Knowledge  of disease and therapeutic regimen to maintain health will improve  Short Term Goals: Ability to demonstrate self-control, Ability to participate in decision making will improve, Ability to verbalize feelings will improve, Ability to disclose and discuss suicidal ideas, Ability to identify and develop effective coping behaviors will improve, and Compliance with prescribed medications will improve  Medication Management: RN will administer medications as ordered by provider, will assess and evaluate patient's response and provide education to patient for prescribed medication. RN will report any adverse and/or side effects to prescribing provider.  Therapeutic Interventions: 1 on 1 counseling sessions, Psychoeducation, Medication administration, Evaluate responses to treatment, Monitor vital signs and CBGs as ordered, Perform/monitor CIWA, COWS, AIMS and Fall Risk screenings as ordered, Perform wound care treatments as ordered.  Evaluation of Outcomes: Progressing   LCSW Treatment Plan for Primary Diagnosis: Schizophrenia (HCC) Long Term Goal(s): Safe transition to appropriate next level of care at discharge, Engage patient in therapeutic group addressing interpersonal concerns.  Short Term Goals: Engage patient in aftercare planning with referrals and resources, Increase social support, Increase ability to appropriately verbalize feelings, Increase emotional regulation, Facilitate acceptance of mental health diagnosis and concerns, and Increase skills for wellness and recovery  Therapeutic Interventions: Assess for all discharge needs, 1 to 1 time with Social worker, Explore available resources and support systems, Assess for adequacy in community support network, Educate family and significant other(s) on suicide prevention, Complete Psychosocial Assessment, Interpersonal group therapy.  Evaluation of Outcomes: Progressing   Progress in Treatment: Attending groups: No. Participating in  groups: No. Taking medication as prescribed: Yes. Toleration medication: Yes. Family/Significant other contact made: No, will contact:  once permission is given Patient understands diagnosis: Yes. Discussing patient identified problems/goals with staff: Yes. Medical problems stabilized or resolved: Yes. Denies suicidal/homicidal ideation: Yes. Issues/concerns per patient self-inventory: No. Other: none  New problem(s) identified: No, Describe:  none Update 04/27/2022: No changes at this time.  Update 05/02/2022:   No changes at this time.   New Short Term/Long Term Goal(s): Patient to work towards elimination of symptoms of psychosis, medication management for mood stabilization; elimination of SI thoughts; development of comprehensive mental wellness plan. Update 04/27/2022: No changes at this time.  Update 05/02/2022:   No changes at this time.   Patient Goals:  No additional goals identified at this time. Patient to continue to work towards original goals identified in initial treatment team meeting. CSW will remain available to patient should they voice additional treatment goals. Update 04/27/2022: No changes at this time.  Update 05/02/2022:   No changes at this time.   Discharge Plan or Barriers: No psychosocial barriers identified at this time, patient to return to place of residence when appropriate for discharge. Update 04/27/2022: No changes at this time.  Update  05/02/2022:   Treatment team has discovered that the patient is not able to return to his group home and is in need of placement.  Meeting was held with treatment team and [facility] to assist patient in identifying appropriate housing.   Reason for Continuation of Hospitalization: Medication stabilization Other; describe psychosis    Estimated Length of Stay: 1-7 days Update 04/27/2022: No changes at this time. Update 05/02/2022:   TBD  Last 3 [place] Suicide Severity Risk Score: Flowsheet Row Admission (Current) from 04/16/2022 in [facility] ED from 04/15/2022 in [place] No Risk No Risk       Last PHQ 2/9 Scores:     No data to display          Scribe for Treatment Team: Rozann Lesches, LCSW 05/02/2022 1:01 PM

## 2022-05-02 NOTE — Progress Notes (Signed)
Recreation Therapy Notes    Date: 05/02/2022  Time: 10:50 am  Location: Craft room     Behavioral response: N/A   Intervention Topic: Self-care   Discussion/Intervention: Patient refused to attend group.   Clinical Observations/Feedback:  Patient refused to attend group.    Burwell Bethel LRT/CTRS         Urbano Milhouse 05/02/2022 12:37 PM 

## 2022-05-02 NOTE — Progress Notes (Signed)
[facility] MD Progress Note  05/02/2022 4:59 PM Travis Sandoval  MRN:  [id number] Subjective: Follow-up 42 year old man with schizophrenia.  Still spends most of his time in bed.  Understands the situation with discharge.  Not acting bizarrely.  Generally cooperative Principal Problem: Schizophrenia (HCC) Diagnosis: Principal Problem:   Schizophrenia (HCC) Active Problems:   Psychosis (HCC)  Total Time spent with patient: 30 minutes  Past Psychiatric History: Past history of schizophrenia  Past Medical History:  Past Medical History:  Diagnosis Date   ADHD    Bipolar 1 disorder (HCC)    Hepatitis C    Schizoaffective disorder (HCC)    History reviewed. No pertinent surgical history. Family History:  Family History  Family history unknown: Yes   Family Psychiatric  History: See previous Social History:  Social History   Substance and Sexual Activity  Alcohol Use Not Currently     Social History   Substance and Sexual Activity  Drug Use Not Currently    Social History   Socioeconomic History   Marital status: Unknown    Spouse name: Not on file   Number of children: Not on file   Years of education: Not on file   Highest education level: Not on file  Occupational History   Not on file  Tobacco Use   Smoking status: Every Day    Packs/day: 0.25    Years: 15.00    Total pack years: 3.75    Types: Cigarettes   Smokeless tobacco: Not on file  Vaping Use   Vaping Use: Not on file  Substance and Sexual Activity   Alcohol use: Not Currently   Drug use: Not Currently   Sexual activity: Not Currently  Other Topics Concern   Not on file  Social History Narrative   Not on file   Social Determinants of Health   Financial Resource Strain: Not on file  Food Insecurity: No Food Insecurity (04/16/2022)   Hunger Vital Sign    Worried About Running Out of Food in the Last Year: Never true    Ran Out of Food in the Last Year: Never true  Transportation Needs: No  Transportation Needs (04/16/2022)   PRAPARE - Administrator, Civil Service (Medical): No    Lack of Transportation (Non-Medical): No  Physical Activity: Not on file  Stress: Not on file  Social Connections: Not on file   Additional Social History:                         Sleep: Fair  Appetite:  Fair  Current Medications: Current Facility-Administered Medications  Medication Dose Route Frequency Provider Last Rate Last Admin   acetaminophen (TYLENOL) tablet 650 mg  650 mg Oral Q6H PRN Vanetta Mulders, NP       alum & mag hydroxide-simeth (MAALOX/MYLANTA) 200-200-20 MG/5ML suspension 30 mL  30 mL Oral Q4H PRN Gabriel Cirri F, NP       cloZAPine (CLOZARIL) tablet 150 mg  150 mg Oral QHS Wylee Ogden T, MD   150 mg at 05/01/22 2135   ziprasidone (GEODON) injection 20 mg  20 mg Intramuscular Q12H PRN Reggie Pile, MD       And   LORazepam (ATIVAN) tablet 2 mg  2 mg Oral PRN Reggie Pile, MD       magnesium hydroxide (MILK OF MAGNESIA) suspension 30 mL  30 mL Oral Daily PRN Vanetta Mulders, NP       nicotine  polacrilex (NICORETTE) gum 2 mg  2 mg Oral PRN Sharlot Sturkey, [name], MD   2 mg at 04/23/22 2200    Lab Results:  Results for orders placed or performed during the hospital encounter of 04/16/22 (from the past 48 hour(s))  CBC with Differential/Platelet     Status: None   Collection Time: 05/01/22  6:56 AM  Result Value Ref Range   WBC 6.7 4.0 - 10.5 K/uL   RBC 4.52 4.22 - 5.81 MIL/uL   Hemoglobin 14.2 13.0 - 17.0 g/dL   HCT 41.7 39.0 - 52.0 %   MCV 92.3 80.0 - 100.0 fL   MCH 31.4 26.0 - 34.0 pg   MCHC 34.1 30.0 - 36.0 g/dL   RDW 12.2 11.5 - 15.5 %   Platelets 202 150 - 400 K/uL   nRBC 0.0 0.0 - 0.2 %   Neutrophils Relative % 45 %   Neutro Abs 3.1 1.7 - 7.7 K/uL   Lymphocytes Relative 39 %   Lymphs Abs 2.6 0.7 - 4.0 K/uL   Monocytes Relative 12 %   Monocytes Absolute 0.8 0.1 - 1.0 K/uL   Eosinophils Relative 2 %   Eosinophils Absolute 0.1 0.0  - 0.5 K/uL   Basophils Relative 1 %   Basophils Absolute 0.0 0.0 - 0.1 K/uL   Immature Granulocytes 1 %   Abs Immature Granulocytes 0.04 0.00 - 0.07 K/uL    Comment: Performed at [name], [address] [place], [place] [address]    Blood Alcohol level:  Lab Results  Component Value Date   [facility] <10 00/93/8182    Metabolic Disorder Labs: Lab Results  Component Value Date   HGBA1C 5.5 04/24/2022   MPG 111 04/24/2022   No results found for: "PROLACTIN" Lab Results  Component Value Date   CHOL 220 (H) 04/24/2022   TRIG 700 (H) 04/24/2022   HDL 35 (L) 04/24/2022   CHOLHDL 6.3 04/24/2022   VLDL UNABLE TO CALCULATE IF TRIGLYCERIDE OVER 400 mg/dL 04/24/2022   LDLCALC UNABLE TO CALCULATE IF TRIGLYCERIDE OVER 400 mg/dL 04/24/2022    Physical Findings: AIMS: Facial and Oral Movements Muscles of Facial Expression: None, normal Lips and Perioral Area: None, normal Jaw: None, normal Tongue: None, normal,Extremity Movements Upper (arms, wrists, hands, fingers): None, normal Lower (legs, knees, ankles, toes): None, normal, Trunk Movements Neck, shoulders, hips: None, normal, Overall Severity Severity of abnormal movements (highest score from questions above): None, normal Incapacitation due to abnormal movements: None, normal Patient's awareness of abnormal movements (rate only patient's report): No Awareness, Dental Status Current problems with teeth and/or dentures?: No Does patient usually wear dentures?: No  CIWA:    COWS:     Musculoskeletal: Strength & Muscle Tone: within normal limits Gait & Station: normal Patient leans: N/A  Psychiatric Specialty Exam:  Presentation  General Appearance:  Disheveled  Eye Contact: Minimal  Speech: Pressured  Speech Volume: Normal  Handedness: Right   Mood and Affect  Mood: Euphoric; Irritable  Affect: Inappropriate   Thought Process  Thought Processes: Disorganized  Descriptions of  Associations:Loose  Orientation:Partial  Thought Content:Illogical; Scattered  History of Schizophrenia/Schizoaffective disorder:No data recorded Duration of Psychotic Symptoms:No data recorded Hallucinations:No data recorded Ideas of Reference:Delusions; Paranoia  Suicidal Thoughts:No data recorded Homicidal Thoughts:No data recorded  Sensorium  Memory: Immediate Poor; Recent Poor; Remote Poor  Judgment: Poor  Insight: Poor   Executive Functions  Concentration: Fair  Attention Span: Fair  Recall: Poor  Fund of Knowledge: Poor  Language: Poor  Psychomotor Activity  Psychomotor Activity:No data recorded  Assets  Assets: Desire for Improvement; Social Support   Sleep  Sleep:No data recorded   Physical Exam: Physical Exam Vitals and nursing note reviewed.  Constitutional:      Appearance: Normal appearance.  HENT:     Head: Normocephalic and atraumatic.     Mouth/Throat:     Pharynx: Oropharynx is clear.  Eyes:     Pupils: Pupils are equal, round, and reactive to light.  Cardiovascular:     Rate and Rhythm: Normal rate and regular rhythm.  Pulmonary:     Effort: Pulmonary effort is normal.     Breath sounds: Normal breath sounds.  Abdominal:     General: Abdomen is flat.     Palpations: Abdomen is soft.  Musculoskeletal:        General: Normal range of motion.  Skin:    General: Skin is warm and dry.  Neurological:     General: No focal deficit present.     Mental Status: He is alert. Mental status is at baseline.  Psychiatric:        Attention and Perception: He is inattentive.        Mood and Affect: Mood normal. Affect is blunt.        Speech: Speech is delayed.        Behavior: Behavior is slowed.        Thought Content: Thought content normal.    Review of Systems  Constitutional: Negative.   HENT: Negative.    Eyes: Negative.   Respiratory: Negative.    Cardiovascular: Negative.   Gastrointestinal: Negative.    Musculoskeletal: Negative.   Skin: Negative.   Neurological: Negative.   Psychiatric/Behavioral: Negative.     Blood pressure 120/78, pulse 99, temperature 97.7 F (36.5 C), temperature source Oral, resp. rate 18, height 5\' 9"  (1.753 m), weight 96.4 kg, SpO2 97 %. Body mass index is 31.38 kg/m.   Treatment Plan Summary: Medication management and Plan working toward some kind of plan for discharge once we get a handle on what his finances are.  Due for his new Invega shot tomorrow but it sounds like the pharmacy may be out of it so we might have to wait a couple days.  Alethia Berthold, MD 05/02/2022, 4:59 PM

## 2022-05-02 NOTE — Group Note (Signed)
LCSW Group Therapy Note  Group Date: 05/02/2022 Start Time: 1300 End Time: 1400   Type of Therapy and Topic:  Group Therapy - Healthy vs Unhealthy Coping Skills  Participation Level:  Did Not Attend   Description of Group The focus of this group was to determine what unhealthy coping techniques typically are used by group members and what healthy coping techniques would be helpful in coping with various problems. Patients were guided in becoming aware of the differences between healthy and unhealthy coping techniques. Patients were asked to identify 2-3 healthy coping skills they would like to learn to use more effectively.  Therapeutic Goals Patients learned that coping is what human beings do all day long to deal with various situations in their lives Patients defined and discussed healthy vs unhealthy coping techniques Patients identified their preferred coping techniques and identified whether these were healthy or unhealthy Patients determined 2-3 healthy coping skills they would like to become more familiar with and use more often. Patients provided support and ideas to each other   Summary of Patient Progress:   Patient did not attend group despite encouraged participation.    Therapeutic Modalities Cognitive Behavioral Therapy Motivational Interviewing  Durenda Hurt, Latanya Presser 05/02/2022  2:25 PM

## 2022-05-02 NOTE — Progress Notes (Signed)
Pt denies SI/HI/AVH and verbally agrees to approach staff if these become apparent or before harming themselves/others. Rates depression 0/10. Rates anxiety 0/10. Rates pain 0/10. Pt has been in his room all day. Scheduled medications administered to pt, per MD orders. RN provided support and encouragement to pt. Q15 min safety checks implemented and continued. Pt safe on the unit. RN will continue to monitor and intervene as needed.  05/02/22 0800  Psych Admission Type (Psych Patients Only)  Admission Status Involuntary  Psychosocial Assessment  Patient Complaints None  Eye Contact Fair  Facial Expression Flat  Affect Appropriate to circumstance  Speech Logical/coherent;Slow  Interaction Assertive;Isolative  Motor Activity Slow  Appearance/Hygiene Unremarkable;In scrubs  Behavior Characteristics Cooperative;Appropriate to situation;Calm  Mood Pleasant  Aggressive Behavior  Effect No apparent injury  Thought Process  Coherency Circumstantial  Content WDL  Delusions None reported or observed  Perception WDL  Hallucination None reported or observed  Judgment Limited  Confusion Mild  Danger to Self  Current suicidal ideation? Denies  Danger to Others  Danger to Others None reported or observed

## 2022-05-03 DIAGNOSIS — F203 Undifferentiated schizophrenia: Secondary | ICD-10-CM | POA: Diagnosis not present

## 2022-05-03 NOTE — Plan of Care (Signed)
  Problem: Education: Goal: Mental status will improve Outcome: Progressing   Problem: Education: Goal: Will be free of psychotic symptoms Outcome: Progressing   Problem: Activity: Goal: Interest or engagement in activities will improve Outcome: Not Progressing

## 2022-05-03 NOTE — Progress Notes (Signed)
Recreation Therapy Notes    Date: 05/03/2022  Time: 9:50 am  Location: Craft room     Behavioral response: N/A   Intervention Topic: Problem Solving  Discussion/Intervention: Patient refused to attend group.   Clinical Observations/Feedback:  Patient refused to attend group.    Flor Houdeshell LRT/CTRS        Ermalee Mealy 05/03/2022 12:41 PM

## 2022-05-03 NOTE — Progress Notes (Signed)
Pt denies SI/HI/AVH and verbally agrees to approach staff if these become apparent or before harming themselves/others. Rates depression 0/10. Rates anxiety 0/10. Rates pain 0/10. Pt has been in his bed all day. Pt gets up for meals. Scheduled medications administered to pt, per MD orders. RN provided support and encouragement to pt. Q15 min safety checks implemented and continued. Pt safe on the unit. RN will continue to monitor and intervene as needed.  05/03/22 0745  Psych Admission Type (Psych Patients Only)  Admission Status Involuntary  Psychosocial Assessment  Patient Complaints None  Eye Contact Fair  Facial Expression Flat  Affect Flat  Speech Logical/coherent  Interaction Isolative  Motor Activity Other (Comment) (WDL)  Appearance/Hygiene Unremarkable;In scrubs  Behavior Characteristics Cooperative;Appropriate to situation;Calm  Mood Pleasant  Aggressive Behavior  Effect No apparent injury  Thought Process  Coherency Disorganized  Content WDL  Delusions None reported or observed  Perception WDL  Hallucination None reported or observed  Judgment Limited  Confusion None  Danger to Self  Current suicidal ideation? Denies  Danger to Others  Danger to Others None reported or observed

## 2022-05-03 NOTE — Group Note (Signed)
[place] LCSW Group Therapy Note   Group Date: 05/03/2022 Start Time: 1610 End Time: 1415   Type of Therapy/Topic:  Group Therapy:  Emotion Regulation  Participation Level:  Did Not Attend   Mood:  Description of Group:    The purpose of this group is to assist patients in learning to regulate negative emotions and experience positive emotions. Patients will be guided to discuss ways in which they have been vulnerable to their negative emotions. These vulnerabilities will be juxtaposed with experiences of positive emotions or situations, and patients challenged to use positive emotions to combat negative ones. Special emphasis will be placed on coping with negative emotions in conflict situations, and patients will process healthy conflict resolution skills.  Therapeutic Goals: Patient will identify two positive emotions or experiences to reflect on in order to balance out negative emotions:  Patient will label two or more emotions that they find the most difficult to experience:  Patient will be able to demonstrate positive conflict resolution skills through discussion or role plays:   Summary of Patient Progress:   Patient declined to attend group, despite encouragement from this CSW.    Therapeutic Modalities:   Cognitive Behavioral Therapy Feelings Identification Dialectical Behavioral Therapy   Rozann Lesches, LCSW

## 2022-05-03 NOTE — Progress Notes (Signed)
Patient is A&O x 4, compliant with medications; isolative but cooperative.  Denies SI HI AVH, denies physical complaints.  Continued monitoring for safety via q 15 minute checks.

## 2022-05-03 NOTE — Progress Notes (Signed)
[facility] MD Progress Note  05/03/2022 3:22 PM Travis Sandoval  MRN:  [id number] Subjective: Patient seen and chart reviewed.  42 year old man with schizophrenia.  He was given his second long-acting Invega injection today which he tolerated well.  He continues to receive clozapine at night but otherwise we have discontinued other antipsychotics.  Still has some delusional content if you talk to him about it but is much less agitated.  Mostly stays to himself.  We would discharge him but we have no place for him to go. Principal Problem: Schizophrenia (HCC) Diagnosis: Principal Problem:   Schizophrenia (HCC) Active Problems:   Psychosis (HCC)  Total Time spent with patient: 30 minutes  Past Psychiatric History: Past history of schizophrenia with long hospitalizations  Past Medical History:  Past Medical History:  Diagnosis Date   ADHD    Bipolar 1 disorder (HCC)    Hepatitis C    Schizoaffective disorder (HCC)    History reviewed. No pertinent surgical history. Family History:  Family History  Family history unknown: Yes   Family Psychiatric  History: See previous Social History:  Social History   Substance and Sexual Activity  Alcohol Use Not Currently     Social History   Substance and Sexual Activity  Drug Use Not Currently    Social History   Socioeconomic History   Marital status: Unknown    Spouse name: Not on file   Number of children: Not on file   Years of education: Not on file   Highest education level: Not on file  Occupational History   Not on file  Tobacco Use   Smoking status: Every Day    Packs/day: 0.25    Years: 15.00    Total pack years: 3.75    Types: Cigarettes   Smokeless tobacco: Not on file  Vaping Use   Vaping Use: Not on file  Substance and Sexual Activity   Alcohol use: Not Currently   Drug use: Not Currently   Sexual activity: Not Currently  Other Topics Concern   Not on file  Social History Narrative   Not on file   Social  Determinants of Health   Financial Resource Strain: Not on file  Food Insecurity: No Food Insecurity (04/16/2022)   Hunger Vital Sign    Worried About Running Out of Food in the Last Year: Never true    Ran Out of Food in the Last Year: Never true  Transportation Needs: No Transportation Needs (04/16/2022)   PRAPARE - Administrator, Civil Service (Medical): No    Lack of Transportation (Non-Medical): No  Physical Activity: Not on file  Stress: Not on file  Social Connections: Not on file   Additional Social History:                         Sleep: Fair  Appetite:  Fair  Current Medications: Current Facility-Administered Medications  Medication Dose Route Frequency Provider Last Rate Last Admin   acetaminophen (TYLENOL) tablet 650 mg  650 mg Oral Q6H PRN Vanetta Mulders, NP       alum & mag hydroxide-simeth (MAALOX/MYLANTA) 200-200-20 MG/5ML suspension 30 mL  30 mL Oral Q4H PRN Gabriel Cirri F, NP       cloZAPine (CLOZARIL) tablet 150 mg  150 mg Oral QHS Khloee Garza T, MD   150 mg at 05/02/22 2111   ziprasidone (GEODON) injection 20 mg  20 mg Intramuscular Q12H PRN Reggie Pile, MD  And   LORazepam (ATIVAN) tablet 2 mg  2 mg Oral PRN [name], MD       magnesium hydroxide (MILK OF MAGNESIA) suspension 30 mL  30 mL Oral Daily PRN [name], NP       nicotine polacrilex (NICORETTE) gum 2 mg  2 mg Oral PRN Vasilia Dise, [name], MD   2 mg at 04/23/22 2200    Lab Results: No results found for this or any previous visit (from the past 48 hour(s)).  Blood Alcohol level:  Lab Results  Component Value Date   ETH <10 48/54/6270    Metabolic Disorder Labs: Lab Results  Component Value Date   HGBA1C 5.5 04/24/2022   MPG 111 04/24/2022   No results found for: "PROLACTIN" Lab Results  Component Value Date   CHOL 220 (H) 04/24/2022   TRIG 700 (H) 04/24/2022   HDL 35 (L) 04/24/2022   CHOLHDL 6.3 04/24/2022   VLDL UNABLE TO CALCULATE IF  TRIGLYCERIDE OVER 400 mg/dL 04/24/2022   LDLCALC UNABLE TO CALCULATE IF TRIGLYCERIDE OVER 400 mg/dL 04/24/2022    Physical Findings: AIMS: Facial and Oral Movements Muscles of Facial Expression: None, normal Lips and Perioral Area: None, normal Jaw: None, normal Tongue: None, normal,Extremity Movements Upper (arms, wrists, hands, fingers): None, normal Lower (legs, knees, ankles, toes): None, normal, Trunk Movements Neck, shoulders, hips: None, normal, Overall Severity Severity of abnormal movements (highest score from questions above): None, normal Incapacitation due to abnormal movements: None, normal Patient's awareness of abnormal movements (rate only patient's report): No Awareness, Dental Status Current problems with teeth and/or dentures?: No Does patient usually wear dentures?: No  CIWA:    COWS:     Musculoskeletal: Strength & Muscle Tone: within normal limits Gait & Station: normal Patient leans: N/A  Psychiatric Specialty Exam:  Presentation  General Appearance:  Disheveled  Eye Contact: Minimal  Speech: Pressured  Speech Volume: Normal  Handedness: Right   Mood and Affect  Mood: Euphoric; Irritable  Affect: Inappropriate   Thought Process  Thought Processes: Disorganized  Descriptions of Associations:Loose  Orientation:Partial  Thought Content:Illogical; Scattered  History of Schizophrenia/Schizoaffective disorder:No data recorded Duration of Psychotic Symptoms:No data recorded Hallucinations:No data recorded Ideas of Reference:Delusions; Paranoia  Suicidal Thoughts:No data recorded Homicidal Thoughts:No data recorded  Sensorium  Memory: Immediate Poor; Recent Poor; Remote Poor  Judgment: Poor  Insight: Poor   Executive Functions  Concentration: Fair  Attention Span: Fair  Recall: Poor  Fund of Knowledge: Poor  Language: Poor   Psychomotor Activity  Psychomotor Activity:No data recorded  Assets   Assets: Desire for Improvement; Social Support   Sleep  Sleep:No data recorded   Physical Exam: Physical Exam Vitals and nursing note reviewed.  Constitutional:      Appearance: Normal appearance.  HENT:     Head: Normocephalic and atraumatic.     Mouth/Throat:     Pharynx: Oropharynx is clear.  Eyes:     Pupils: Pupils are equal, round, and reactive to light.  Cardiovascular:     Rate and Rhythm: Normal rate and regular rhythm.  Pulmonary:     Effort: Pulmonary effort is normal.     Breath sounds: Normal breath sounds.  Abdominal:     General: Abdomen is flat.     Palpations: Abdomen is soft.  Musculoskeletal:        General: Normal range of motion.  Skin:    General: Skin is warm and dry.  Neurological:     General: No focal  deficit present.     Mental Status: He is alert. Mental status is at baseline.  Psychiatric:        Attention and Perception: Attention normal.        Mood and Affect: Mood normal. Affect is blunt.        Speech: Speech normal.        Behavior: Behavior is cooperative.        Thought Content: Thought content normal.        Cognition and Memory: Cognition normal.    Review of Systems  Constitutional: Negative.   HENT: Negative.    Eyes: Negative.   Respiratory: Negative.    Cardiovascular: Negative.   Gastrointestinal: Negative.   Musculoskeletal: Negative.   Skin: Negative.   Neurological: Negative.   Psychiatric/Behavioral: Negative.     Blood pressure 135/81, pulse (!) 121, temperature 97.6 F (36.4 C), temperature source Oral, resp. rate 20, height 5\' 9"  (1.753 m), weight 96.4 kg, SpO2 98 %. Body mass index is 31.38 kg/m.   Treatment Plan Summary: Medication management and Plan no change to medication.  He is off of his other oral antipsychotics.  Continue this dose of clozapine.  We are still working on figuring out how the money is going to work so we can look for a discharge plan.  Alethia Berthold, MD 05/03/2022, 3:22 PM

## 2022-05-04 DIAGNOSIS — F200 Paranoid schizophrenia: Secondary | ICD-10-CM | POA: Diagnosis not present

## 2022-05-04 NOTE — Progress Notes (Signed)
[facility] MD Progress Note  05/04/2022 12:17 PM Travis Sandoval  MRN:  [id number] Subjective: Travis Sandoval is seen on rounds.  He is very pleasant and cooperative.  He states he is doing well and asked when he can be discharged.  He does not have any complaints.  Nurses report no issues.  No side effects from his medicine. Principal Problem: Schizophrenia ([place]) Diagnosis: Principal Problem:   Schizophrenia ([place]) Active Problems:   Psychosis ([place])  Total Time spent with patient: 15 minutes  Past Psychiatric History: Schizophrenia  Past Medical History:  Past Medical History:  Diagnosis Date   ADHD    Bipolar 1 disorder ([place])    Hepatitis C    Schizoaffective disorder ([place])    History reviewed. No pertinent surgical history. Family History:  Family History  Family history unknown: Yes    Social History:  Social History   Substance and Sexual Activity  Alcohol Use Not Currently     Social History   Substance and Sexual Activity  Drug Use Not Currently    Social History   Socioeconomic History   Marital status: Unknown    Spouse name: Not on file   Number of children: Not on file   Years of education: Not on file   Highest education level: Not on file  Occupational History   Not on file  Tobacco Use   Smoking status: Every Day    Packs/day: 0.25    Years: 15.00    Total pack years: 3.75    Types: Cigarettes   Smokeless tobacco: Not on file  Vaping Use   Vaping Use: Not on file  Substance and Sexual Activity   Alcohol use: Not Currently   Drug use: Not Currently   Sexual activity: Not Currently  Other Topics Concern   Not on file  Social History Narrative   Not on file   Social Determinants of Health   Financial Resource Strain: Not on file  Food Insecurity: No Food Insecurity (04/16/2022)   Hunger Vital Sign    Worried About Running Out of Food in the Last Year: Never true    Ran Out of Food in the Last Year: Never true  Transportation Needs: No Transportation  Needs (04/16/2022)   PRAPARE - Hydrologist (Medical): No    Lack of Transportation (Non-Medical): No  Physical Activity: Not on file  Stress: Not on file  Social Connections: Not on file   Additional Social History:                         Sleep: Good  Appetite:  Good  Current Medications: Current Facility-Administered Medications  Medication Dose Route Frequency Provider Last Rate Last Admin   acetaminophen (TYLENOL) tablet 650 mg  650 mg Oral Q6H PRN [name], NP       alum & mag hydroxide-simeth (MAALOX/MYLANTA) 200-200-20 MG/5ML suspension 30 mL  30 mL Oral Q4H PRN [name], NP       cloZAPine (CLOZARIL) tablet 150 mg  150 mg Oral QHS [name], [name], MD   150 mg at 05/03/22 2159   ziprasidone (GEODON) injection 20 mg  20 mg Intramuscular Q12H PRN [name], MD       And   LORazepam (ATIVAN) tablet 2 mg  2 mg Oral PRN [name], MD       magnesium hydroxide (MILK OF MAGNESIA) suspension 30 mL  30 mL Oral Daily PRN Barthold,  Lanny Hurst, NP       nicotine polacrilex (NICORETTE) gum 2 mg  2 mg Oral PRN [name], [name], MD   2 mg at 04/23/22 2200    Lab Results: No results found for this or any previous visit (from the past 48 hour(s)).  Blood Alcohol level:  Lab Results  Component Value Date   ETH <10 42/35/3614    Metabolic Disorder Labs: Lab Results  Component Value Date   HGBA1C 5.5 04/24/2022   MPG 111 04/24/2022   No results found for: "PROLACTIN" Lab Results  Component Value Date   CHOL 220 (H) 04/24/2022   TRIG 700 (H) 04/24/2022   HDL 35 (L) 04/24/2022   CHOLHDL 6.3 04/24/2022   VLDL UNABLE TO CALCULATE IF TRIGLYCERIDE OVER 400 mg/dL 04/24/2022   LDLCALC UNABLE TO CALCULATE IF TRIGLYCERIDE OVER 400 mg/dL 04/24/2022    Physical Findings: AIMS: Facial and Oral Movements Muscles of Facial Expression: None, normal Lips and Perioral Area: None, normal Jaw: None, normal Tongue: None,  normal,Extremity Movements Upper (arms, wrists, hands, fingers): None, normal Lower (legs, knees, ankles, toes): None, normal, Trunk Movements Neck, shoulders, hips: None, normal, Overall Severity Severity of abnormal movements (highest score from questions above): None, normal Incapacitation due to abnormal movements: None, normal Patient's awareness of abnormal movements (rate only patient's report): No Awareness, Dental Status Current problems with teeth and/or dentures?: No Does patient usually wear dentures?: No  CIWA:    COWS:     Musculoskeletal: Strength & Muscle Tone: within normal limits Gait & Station: normal Patient leans: N/A  Psychiatric Specialty Exam:  Presentation  General Appearance:  Disheveled  Eye Contact: Minimal  Speech: Pressured  Speech Volume: Normal  Handedness: Right   Mood and Affect  Mood: Euphoric; Irritable  Affect: Inappropriate   Thought Process  Thought Processes: Disorganized  Descriptions of Associations:Loose  Orientation:Partial  Thought Content:Illogical; Scattered  History of Schizophrenia/Schizoaffective disorder:No data recorded Duration of Psychotic Symptoms:No data recorded Hallucinations:No data recorded Ideas of Reference:Delusions; Paranoia  Suicidal Thoughts:No data recorded Homicidal Thoughts:No data recorded  Sensorium  Memory: Immediate Poor; Recent Poor; Remote Poor  Judgment: Poor  Insight: Poor   Executive Functions  Concentration: Fair  Attention Span: Fair  Recall: Poor  Fund of Knowledge: Poor  Language: Poor   Psychomotor Activity  Psychomotor Activity:No data recorded  Assets  Assets: Desire for Improvement; Social Support   Sleep  Sleep:No data recorded    Blood pressure (!) 143/81, pulse (!) 121, temperature 98.4 F (36.9 C), temperature source Oral, resp. rate 18, height 5\' 9"  (1.753 m), weight 96.4 kg, SpO2 97 %. Body mass index is 31.38  kg/m.   Treatment Plan Summary: Daily contact with patient to assess and evaluate symptoms and progress in treatment, Medication management, and Plan continue current medications.  Parks Ranger, DO 05/04/2022, 12:17 PM

## 2022-05-04 NOTE — Progress Notes (Signed)
D- Patient alert and oriented x 3-4. Affect flat/mood depressed. Denies SI/ HI/AVH. He denies pain.  A- Scheduled medications administered to patient, per MD orders. Support and encouragement provided.  Routine safety checks conducted every 15 minutes.  Patient informed to notify staff with problems or concerns. R- No adverse drug reactions noted. Patient contracts for safety at this time. Patient compliant with medications and treatment plan. Patient receptive, calm, and cooperative. Patient is isolative to his room except at mealtimes.He contracts for safety and remains safe on the unit at this time.

## 2022-05-04 NOTE — Plan of Care (Signed)
Patient is actively compliant with medication regimen and denies SI HI AVH.  Pt  is calm and cooperative although still isolative.  Pt denies physical discomfort or concerns.    Problem: Health Behavior/Discharge Planning: Goal: Ability to manage health-related needs will improve Outcome: Progressing   Problem: Coping: Goal: Level of anxiety will decrease Outcome: Progressing   Problem: Coping: Goal: Ability to demonstrate self-control will improve Outcome: Progressing   Problem: Education: Goal: Will be free of psychotic symptoms Outcome: Progressing   Problem: Education: Goal: Knowledge of the prescribed therapeutic regimen will improve Outcome: Progressing

## 2022-05-04 NOTE — Plan of Care (Signed)
  Problem: Education: Goal: Knowledge of General Education information will improve Description: Including pain rating scale, medication(s)/side effects and non-pharmacologic comfort measures Outcome: Progressing   Problem: Health Behavior/Discharge Planning: Goal: Ability to manage health-related needs will improve Outcome: Progressing   Problem: Clinical Measurements: Goal: Ability to maintain clinical measurements within normal limits will improve Outcome: Progressing Goal: Will remain free from infection Outcome: Progressing Goal: Diagnostic test results will improve Outcome: Progressing Goal: Respiratory complications will improve Outcome: Progressing Goal: Cardiovascular complication will be avoided Outcome: Progressing   Problem: Activity: Goal: Risk for activity intolerance will decrease Outcome: Progressing   Problem: Nutrition: Goal: Adequate nutrition will be maintained Outcome: Progressing   Problem: Coping: Goal: Level of anxiety will decrease Outcome: Progressing   Problem: Elimination: Goal: Will not experience complications related to bowel motility Outcome: Progressing Goal: Will not experience complications related to urinary retention Outcome: Progressing   Problem: Pain Managment: Goal: General experience of comfort will improve Outcome: Progressing   Problem: Safety: Goal: Ability to remain free from injury will improve Outcome: Progressing   Problem: Skin Integrity: Goal: Risk for impaired skin integrity will decrease Outcome: Progressing   Problem: Education: Goal: Knowledge of [facility] Education information/materials will improve Outcome: Progressing Goal: Emotional status will improve Outcome: Progressing Goal: Mental status will improve Outcome: Progressing Goal: Verbalization of understanding the information provided will improve Outcome: Progressing   Problem: Activity: Goal: Interest or engagement in activities will  improve Outcome: Progressing Goal: Sleeping patterns will improve Outcome: Progressing   Problem: Coping: Goal: Ability to verbalize frustrations and anger appropriately will improve Outcome: Progressing Goal: Ability to demonstrate self-control will improve Outcome: Progressing   Problem: Health Behavior/Discharge Planning: Goal: Identification of resources available to assist in meeting health care needs will improve Outcome: Progressing Goal: Compliance with treatment plan for underlying cause of condition will improve Outcome: Progressing   Problem: Physical Regulation: Goal: Ability to maintain clinical measurements within normal limits will improve Outcome: Progressing   Problem: Safety: Goal: Periods of time without injury will increase Outcome: Progressing   Problem: Activity: Goal: Will verbalize the importance of balancing activity with adequate rest periods Outcome: Progressing   Problem: Education: Goal: Will be free of psychotic symptoms Outcome: Progressing Goal: Knowledge of the prescribed therapeutic regimen will improve Outcome: Progressing   Problem: Coping: Goal: Coping ability will improve Outcome: Progressing Goal: Will verbalize feelings Outcome: Progressing   Problem: Health Behavior/Discharge Planning: Goal: Compliance with prescribed medication regimen will improve Outcome: Progressing   Problem: Nutritional: Goal: Ability to achieve adequate nutritional intake will improve Outcome: Progressing   Problem: Role Relationship: Goal: Ability to communicate needs accurately will improve Outcome: Progressing Goal: Ability to interact with others will improve Outcome: Progressing   Problem: Safety: Goal: Ability to redirect hostility and anger into socially appropriate behaviors will improve Outcome: Progressing Goal: Ability to remain free from injury will improve Outcome: Progressing   Problem: Self-Care: Goal: Ability to participate  in self-care as condition permits will improve Outcome: Progressing   Problem: Self-Concept: Goal: Will verbalize positive feelings about self Outcome: Progressing

## 2022-05-05 DIAGNOSIS — F200 Paranoid schizophrenia: Secondary | ICD-10-CM | POA: Diagnosis not present

## 2022-05-05 NOTE — [facility] Group Notes (Signed)
[facility] Group Notes:  (Nursing/MHT/Case Management/Adjunct)  Date:  05/05/2022  Time:  9:30 AM  Type of Therapy:   community meeting  Participation Level:  Did Not Attend    Travis Sandoval P [name] 05/05/2022, 9:30 AM 

## 2022-05-05 NOTE — Progress Notes (Signed)
[facility] MD Progress Note  05/05/2022 12:23 PM Travis Sandoval  MRN:  [id number] Subjective: Travis Sandoval is seen on rounds.  He is mostly isolative to his room.  He has no complaints.  No side effects from his medications.  He states he is sleeping well and his appetite is good.  He asks about when he can leave.  He has been pleasant and cooperative.  Nurses report no issues. Principal Problem: Schizophrenia ([place]) Diagnosis: Principal Problem:   Schizophrenia ([place]) Active Problems:   Psychosis ([place])  Total Time spent with patient: 15 minutes  Past Psychiatric History: History of schizophrenia  Past Medical History:  Past Medical History:  Diagnosis Date   ADHD    Bipolar 1 disorder ([place])    Hepatitis C    Schizoaffective disorder ([place])    History reviewed. No pertinent surgical history. Family History:  Family History  Family history unknown: Yes    Social History:  Social History   Substance and Sexual Activity  Alcohol Use Not Currently     Social History   Substance and Sexual Activity  Drug Use Not Currently    Social History   Socioeconomic History   Marital status: Unknown    Spouse name: Not on file   Number of children: Not on file   Years of education: Not on file   Highest education level: Not on file  Occupational History   Not on file  Tobacco Use   Smoking status: Every Day    Packs/day: 0.25    Years: 15.00    Total pack years: 3.75    Types: Cigarettes   Smokeless tobacco: Not on file  Vaping Use   Vaping Use: Not on file  Substance and Sexual Activity   Alcohol use: Not Currently   Drug use: Not Currently   Sexual activity: Not Currently  Other Topics Concern   Not on file  Social History Narrative   Not on file   Social Determinants of Health   Financial Resource Strain: Not on file  Food Insecurity: No Food Insecurity (04/16/2022)   Hunger Vital Sign    Worried About Running Out of Food in the Last Year: Never true    Ran Out of Food in  the Last Year: Never true  Transportation Needs: No Transportation Needs (04/16/2022)   PRAPARE - Hydrologist (Medical): No    Lack of Transportation (Non-Medical): No  Physical Activity: Not on file  Stress: Not on file  Social Connections: Not on file   Additional Social History:                         Sleep: Good  Appetite:  Good  Current Medications: Current Facility-Administered Medications  Medication Dose Route Frequency Provider Last Rate Last Admin   acetaminophen (TYLENOL) tablet 650 mg  650 mg Oral Q6H PRN [name], NP       alum & mag hydroxide-simeth (MAALOX/MYLANTA) 200-200-20 MG/5ML suspension 30 mL  30 mL Oral Q4H PRN [name], NP       cloZAPine (CLOZARIL) tablet 150 mg  150 mg Oral QHS [name], [name], MD   150 mg at 05/04/22 2120   ziprasidone (GEODON) injection 20 mg  20 mg Intramuscular Q12H PRN [name], MD       And   LORazepam (ATIVAN) tablet 2 mg  2 mg Oral PRN [name], MD       magnesium  hydroxide (MILK OF MAGNESIA) suspension 30 mL  30 mL Oral Daily PRN [name], NP       nicotine polacrilex (NICORETTE) gum 2 mg  2 mg Oral PRN [name], [name], MD   2 mg at 04/23/22 2200    Lab Results: No results found for this or any previous visit (from the past 48 hour(s)).  Blood Alcohol level:  Lab Results  Component Value Date   ETH <10 58/52/7782    Metabolic Disorder Labs: Lab Results  Component Value Date   HGBA1C 5.5 04/24/2022   MPG 111 04/24/2022   No results found for: "PROLACTIN" Lab Results  Component Value Date   CHOL 220 (H) 04/24/2022   TRIG 700 (H) 04/24/2022   HDL 35 (L) 04/24/2022   CHOLHDL 6.3 04/24/2022   VLDL UNABLE TO CALCULATE IF TRIGLYCERIDE OVER 400 mg/dL 04/24/2022   LDLCALC UNABLE TO CALCULATE IF TRIGLYCERIDE OVER 400 mg/dL 04/24/2022    Physical Findings: AIMS: Facial and Oral Movements Muscles of Facial Expression: None, normal Lips and  Perioral Area: None, normal Jaw: None, normal Tongue: None, normal,Extremity Movements Upper (arms, wrists, hands, fingers): None, normal Lower (legs, knees, ankles, toes): None, normal, Trunk Movements Neck, shoulders, hips: None, normal, Overall Severity Severity of abnormal movements (highest score from questions above): None, normal Incapacitation due to abnormal movements: None, normal Patient's awareness of abnormal movements (rate only patient's report): No Awareness, Dental Status Current problems with teeth and/or dentures?: No Does patient usually wear dentures?: No  CIWA:    COWS:     Musculoskeletal: Strength & Muscle Tone: within normal limits Gait & Station: normal Patient leans: N/A  Psychiatric Specialty Exam:  Presentation  General Appearance:  Disheveled  Eye Contact: Minimal  Speech: Pressured  Speech Volume: Normal  Handedness: Right   Mood and Affect  Mood: Euphoric; Irritable  Affect: Inappropriate   Thought Process  Thought Processes: Disorganized  Descriptions of Associations:Loose  Orientation:Partial  Thought Content:Illogical; Scattered  History of Schizophrenia/Schizoaffective disorder:No data recorded Duration of Psychotic Symptoms:No data recorded Hallucinations:No data recorded Ideas of Reference:Delusions; Paranoia  Suicidal Thoughts:No data recorded Homicidal Thoughts:No data recorded  Sensorium  Memory: Immediate Poor; Recent Poor; Remote Poor  Judgment: Poor  Insight: Poor   Executive Functions  Concentration: Fair  Attention Span: Fair  Recall: Poor  Fund of Knowledge: Poor  Language: Poor   Psychomotor Activity  Psychomotor Activity:No data recorded  Assets  Assets: Desire for Improvement; Social Support   Sleep  Sleep:No data recorded    Blood pressure 112/71, pulse 95, temperature 97.8 F (36.6 C), temperature source Oral, resp. rate 17, height 5\' 9"  (1.753 m), weight 96.4  kg, SpO2 95 %. Body mass index is 31.38 kg/m.   Treatment Plan Summary: Daily contact with patient to assess and evaluate symptoms and progress in treatment, Medication management, and Plan continue current medications.  Parks Ranger, DO 05/05/2022, 12:23 PM

## 2022-05-05 NOTE — Plan of Care (Signed)
  Problem: Nutrition: Goal: Adequate nutrition will be maintained Outcome: Progressing   Problem: Coping: Goal: Level of anxiety will decrease Outcome: Progressing   Problem: Safety: Goal: Ability to remain free from injury will improve Outcome: Progressing   Problem: Skin Integrity: Goal: Risk for impaired skin integrity will decrease Outcome: Progressing   

## 2022-05-05 NOTE — Progress Notes (Signed)
D- Patient alert and oriented x 2-3. Affect blunted/mood anxious. Denies SI/ HI/ AVH. He denies depression but anxiety inferred.  A- No scheduled medications administered to patient, per MD orders. Support and encouragement provided.  Routine safety checks conducted every 15 minutes.  Patient informed to notify staff with problems or concerns. R- Patient contracts for safety at this time. Patient compliant with medications and treatment plan. Patient isolative but calm and cooperative. Patient stays in room except for meals. Patient contracts for safety and remains safe on the unit at this time.

## 2022-05-06 DIAGNOSIS — F203 Undifferentiated schizophrenia: Secondary | ICD-10-CM | POA: Diagnosis not present

## 2022-05-06 MED ORDER — PALIPERIDONE PALMITATE ER 156 MG/ML IM SUSY
156.0000 mg | PREFILLED_SYRINGE | INTRAMUSCULAR | Status: DC
  Administered 2022-05-30: 156 mg via INTRAMUSCULAR
  Filled 2022-05-06 (×2): qty 1

## 2022-05-06 NOTE — Progress Notes (Signed)
D- Patient alert and oriented x 3-4. Affect anxious/mood anxious. Denies SI/ HI/ AVH. He has been out of his room on the phone. States he is planning for discharge and needs "number for Vyya". A- No scheduled medications administered to patient, per MD orders. Support and encouragement provided. Patient allowed to get phone from belongings and charge.  Routine safety checks conducted every 15 minutes without incident.  Patient informed to notify staff with problems or concerns. R- Compliant with Clozaril and labs. Patient compliant with treatment plan. Patient calm and cooperative. Patient stays in his room for most of the shift except for meals. He contract for   Patient remains safe at this time.

## 2022-05-06 NOTE — Plan of Care (Signed)
Pt denies SI HI AVH.  Compliant with medications.  Denies physical complaints.  Pt is isolative to room much of the shift.  Continued monitoring for safety via Q 15 minute checks.

## 2022-05-06 NOTE — Progress Notes (Signed)
[facility] MD Progress Note  05/06/2022 10:33 AM Travis Sandoval  MRN:  [id number] Subjective: Yet follow-up for this 42 year old man with schizophrenia.  Patient seen and chart reviewed.  No new complaints.  Spends most of his time isolated.  No agitated behavior and no threatening behavior.  Patient states that he understands the situation that his funding has been cut and that has impeded our discharge plan.  He has been compliant recently with medicine.  He now is on the clozapine and also the long-acting injectable medicine which will next be due in the first week of February.  Seems to be doing fine without the Depakote. Principal Problem: Schizophrenia ([place]) Diagnosis: Principal Problem:   Schizophrenia ([place]) Active Problems:   Psychosis ([place])  Total Time spent with patient: 30 minutes  Past Psychiatric History: Past history of schizophrenia longstanding with lengthy hospitalizations  Past Medical History:  Past Medical History:  Diagnosis Date   ADHD    Bipolar 1 disorder ([place])    Hepatitis C    Schizoaffective disorder ([place])    History reviewed. No pertinent surgical history. Family History:  Family History  Family history unknown: Yes   Family Psychiatric  History: None reported Social History:  Social History   Substance and Sexual Activity  Alcohol Use Not Currently     Social History   Substance and Sexual Activity  Drug Use Not Currently    Social History   Socioeconomic History   Marital status: Unknown    Spouse name: Not on file   Number of children: Not on file   Years of education: Not on file   Highest education level: Not on file  Occupational History   Not on file  Tobacco Use   Smoking status: Every Day    Packs/day: 0.25    Years: 15.00    Total pack years: 3.75    Types: Cigarettes   Smokeless tobacco: Not on file  Vaping Use   Vaping Use: Not on file  Substance and Sexual Activity   Alcohol use: Not Currently   Drug use: Not Currently    Sexual activity: Not Currently  Other Topics Concern   Not on file  Social History Narrative   Not on file   Social Determinants of Health   Financial Resource Strain: Not on file  Food Insecurity: No Food Insecurity (04/16/2022)   Hunger Vital Sign    Worried About Running Out of Food in the Last Year: Never true    Ran Out of Food in the Last Year: Never true  Transportation Needs: No Transportation Needs (04/16/2022)   PRAPARE - Hydrologist (Medical): No    Lack of Transportation (Non-Medical): No  Physical Activity: Not on file  Stress: Not on file  Social Connections: Not on file   Additional Social History:                         Sleep: Fair  Appetite:  Fair  Current Medications: Current Facility-Administered Medications  Medication Dose Route Frequency Provider Last Rate Last Admin   acetaminophen (TYLENOL) tablet 650 mg  650 mg Oral Q6H PRN [name], NP       alum & mag hydroxide-simeth (MAALOX/MYLANTA) 200-200-20 MG/5ML suspension 30 mL  30 mL Oral Q4H PRN [name], NP   30 mL at 05/05/22 1626   cloZAPine (CLOZARIL) tablet 150 mg  150 mg Oral QHS Nylene Inlow, [name], MD  150 mg at 05/05/22 2028   ziprasidone (GEODON) injection 20 mg  20 mg Intramuscular Q12H PRN [name], MD       And   LORazepam (ATIVAN) tablet 2 mg  2 mg Oral PRN [name], MD       magnesium hydroxide (MILK OF MAGNESIA) suspension 30 mL  30 mL Oral Daily PRN [name], NP       nicotine polacrilex (NICORETTE) gum 2 mg  2 mg Oral PRN Alexzia Kasler T, MD   2 mg at 04/23/22 2200   [START ON 05/30/2022] paliperidone (INVEGA SUSTENNA) injection 156 mg  156 mg Intramuscular Q28 days Ranferi Clingan, [name], MD        Lab Results: No results found for this or any previous visit (from the past 48 hour(s)).  Blood Alcohol level:  Lab Results  Component Value Date   ETH <10 95/63/8756    Metabolic Disorder Labs: Lab Results  Component  Value Date   HGBA1C 5.5 04/24/2022   MPG 111 04/24/2022   No results found for: "PROLACTIN" Lab Results  Component Value Date   CHOL 220 (H) 04/24/2022   TRIG 700 (H) 04/24/2022   HDL 35 (L) 04/24/2022   CHOLHDL 6.3 04/24/2022   VLDL UNABLE TO CALCULATE IF TRIGLYCERIDE OVER 400 mg/dL 04/24/2022   LDLCALC UNABLE TO CALCULATE IF TRIGLYCERIDE OVER 400 mg/dL 04/24/2022    Physical Findings: AIMS: Facial and Oral Movements Muscles of Facial Expression: None, normal Lips and Perioral Area: None, normal Jaw: None, normal Tongue: None, normal,Extremity Movements Upper (arms, wrists, hands, fingers): None, normal Lower (legs, knees, ankles, toes): None, normal, Trunk Movements Neck, shoulders, hips: None, normal, Overall Severity Severity of abnormal movements (highest score from questions above): None, normal Incapacitation due to abnormal movements: None, normal Patient's awareness of abnormal movements (rate only patient's report): No Awareness, Dental Status Current problems with teeth and/or dentures?: No Does patient usually wear dentures?: No  CIWA:    COWS:     Musculoskeletal: Strength & Muscle Tone: within normal limits Gait & Station: normal Patient leans: N/A  Psychiatric Specialty Exam:  Presentation  General Appearance:  Disheveled  Eye Contact: Minimal  Speech: Pressured  Speech Volume: Normal  Handedness: Right   Mood and Affect  Mood: Euphoric; Irritable  Affect: Inappropriate   Thought Process  Thought Processes: Disorganized  Descriptions of Associations:Loose  Orientation:Partial  Thought Content:Illogical; Scattered  History of Schizophrenia/Schizoaffective disorder:No data recorded Duration of Psychotic Symptoms:No data recorded Hallucinations:No data recorded Ideas of Reference:Delusions; Paranoia  Suicidal Thoughts:No data recorded Homicidal Thoughts:No data recorded  Sensorium  Memory: Immediate Poor; Recent Poor;  Remote Poor  Judgment: Poor  Insight: Poor   Executive Functions  Concentration: Fair  Attention Span: Fair  Recall: Poor  Fund of Knowledge: Poor  Language: Poor   Psychomotor Activity  Psychomotor Activity:No data recorded  Assets  Assets: Desire for Improvement; Social Support   Sleep  Sleep:No data recorded   Physical Exam: Physical Exam Vitals and nursing note reviewed.  Constitutional:      Appearance: Normal appearance.  HENT:     Head: Normocephalic and atraumatic.     Mouth/Throat:     Pharynx: Oropharynx is clear.  Eyes:     Pupils: Pupils are equal, round, and reactive to light.  Cardiovascular:     Rate and Rhythm: Normal rate and regular rhythm.  Pulmonary:     Effort: Pulmonary effort is normal.     Breath sounds: Normal breath sounds.  Abdominal:     General: Abdomen is flat.     Palpations: Abdomen is soft.  Musculoskeletal:        General: Normal range of motion.  Skin:    General: Skin is warm and dry.  Neurological:     General: No focal deficit present.     Mental Status: He is alert. Mental status is at baseline.  Psychiatric:        Attention and Perception: He is inattentive.        Mood and Affect: Mood normal. Affect is blunt.        Speech: Speech is delayed.        Behavior: Behavior is slowed.        Thought Content: Thought content normal.    Review of Systems  Constitutional: Negative.   HENT: Negative.    Eyes: Negative.   Respiratory: Negative.    Cardiovascular: Negative.   Gastrointestinal: Negative.   Musculoskeletal: Negative.   Skin: Negative.   Neurological: Negative.   Psychiatric/Behavioral: Negative.     Blood pressure 120/76, pulse (!) 113, temperature 97.7 F (36.5 C), temperature source Oral, resp. rate 18, height 5\' 9"  (1.753 m), weight 96.4 kg, SpO2 96 %. Body mass index is 31.38 kg/m.   Treatment Plan Summary: Medication management and Plan medications reviewed.  Chart reviewed.   No indication to make any change in medicine.  I went ahead and made sure that the Invega Sustanon was ordered every month so that it will not drop off of our memory.  Meanwhile we have no way to discharge patient for lack of resources.  Continue daily involvement in appropriate therapy.  , MD 05/06/2022, 10:33 AM

## 2022-05-06 NOTE — [facility] Group Notes (Signed)
[place] Group Notes:  (Nursing/MHT/Case Management/Adjunct)  Date:  05/06/2022  Time:  10:28 PM  Type of Therapy:  Group Therapy  Participation Level:  Minimal  Participation Quality:  Appropriate  Affect:  Blunted and Irritable  Cognitive:  Alert and Disorganized  Insight:  Lacking  Engagement in Group:  Distracting and Limited  Modes of Intervention:  Discussion  Summary of Progress/Problems: Wrap up group completed during snack time.   Maglione,Maxamilian Amadon E 05/06/2022, 10:28 PM

## 2022-05-06 NOTE — B[facility] Counselor (Signed)
CSW met with the patient to discuss discharge.   Patient reports that he would like to go to independent housing and has been working with [employer].  CSW pointed out that this may take time and patient may need to go to a group home, once funding is in place, and until independent housing is available.  Patient was open to this plan.  Assunta Curtis, MSW, LCSW 05/06/2022 11:15 AM

## 2022-05-06 NOTE — Progress Notes (Signed)
Patient presents with bright affect. Denies SI, HI, AVH. Medication compliant. Appropriate with staff and peers. Out of room for meals and meds. Slept well. Encouragement and support provided. Safety checks maintained. Medication given as prescribed. Pt receptive and remains safe on unit with q 15 min checks.

## 2022-05-06 NOTE — Progress Notes (Signed)
Recreation Therapy Notes  Date: 05/06/2022   Time: 10:20 am   Location: Craft room      Behavioral response: N/A   Intervention Topic: Strengths   Discussion/Intervention: Patient refused to attend group.    Clinical Observations/Feedback:  Patient refused to attend group.    Jaclynn Laumann LRT/CTRS        Demetric Parslow 05/06/2022 10:47 AM

## 2022-05-06 NOTE — Group Note (Signed)
[facility] LCSW Group Therapy Note    Group Date: 05/06/2022 Start Time: 1300 End Time: 1400  Type of Therapy and Topic:  Group Therapy:  Overcoming Obstacles  Participation Level:  [facility] PARTICIPATION LEVEL: Did Not Attend   Description of Group:   In this group patients will be encouraged to explore what they see as obstacles to their own wellness and recovery. They will be guided to discuss their thoughts, feelings, and behaviors related to these obstacles. The group will process together ways to cope with barriers, with attention given to specific choices patients can make. Each patient will be challenged to identify changes they are motivated to make in order to overcome their obstacles. This group will be process-oriented, with patients participating in exploration of their own experiences as well as giving and receiving support and challenge from other group members.  Therapeutic Goals: 1. Patient will identify personal and current obstacles as they relate to admission. 2. Patient will identify barriers that currently interfere with their wellness or overcoming obstacles.  3. Patient will identify feelings, thought process and behaviors related to these barriers. 4. Patient will identify two changes they are willing to make to overcome these obstacles:    Summary of Patient Progress X   Therapeutic Modalities:   Cognitive Behavioral Therapy Solution Focused Therapy Motivational Interviewing Relapse Prevention Therapy   Ilda Laskin R Tinia Oravec, LCSW 

## 2022-05-07 NOTE — Progress Notes (Signed)
Recreation Therapy Notes Date: 05/07/2022   Time: 10:15 am   Location: Craft room      Behavioral response: N/A   Intervention Topic: Leisure    Discussion/Intervention: Patient refused to attend group.    Clinical Observations/Feedback:  Patient refused to attend group.    Aoki Wedemeyer LRT/CTRS        Jameah Rouser 05/07/2022 12:02 PM

## 2022-05-07 NOTE — Plan of Care (Signed)
D: Patient alert and oriented. Patient denies pain. Patient denies anxiety and depression. Patient denies SI/HI/AVH. Patient has been isolative to room during shift with the exception to coming out for meals.   A: Patient did not have any scheduled medication during this shift. Support and encouragement provided to patient.  Q15 minute safety checks maintained.   R: Patient compliant with medication administration and treatment plan. No adverse drug reactions noted. Patient remains safe on the unit at this time.  Problem: Education: Goal: Knowledge of  General Education information/materials will improve Outcome: Progressing   Problem: Health Behavior/Discharge Planning: Goal: Compliance with treatment plan for underlying cause of condition will improve Outcome: Progressing   Problem: Physical Regulation: Goal: Ability to maintain clinical measurements within normal limits will improve Outcome: Progressing   Problem: Safety: Goal: Periods of time without injury will increase Outcome: Progressing   Problem: Education: Goal: Will be free of psychotic symptoms Outcome: Progressing Goal: Knowledge of the prescribed therapeutic regimen will improve Outcome: Progressing   Problem: Health Behavior/Discharge Planning: Goal: Compliance with prescribed medication regimen will improve Outcome: Progressing

## 2022-05-07 NOTE — [facility] Group Notes (Signed)
[place] Group Notes:  (Nursing/MHT/Case Management/Adjunct)  Date:  05/07/2022  Time:  10:04 AM  Type of Therapy:   Community Meeting  Participation Level:  Did Not Attend   Adela Lank [facility] 05/07/2022, 10:04 AM

## 2022-05-07 NOTE — Progress Notes (Signed)
Patient calm and pleasant during assessment denying SI/HI/AVH. Pt isoaltive to his room tonight but did come out for medication and snacks. Pt compliant with medication administration per MD orders. Pt given education, support, and encouragement to be acitve in his treatment plan. Pt being monitored Q 15 minutes for safety per unit protocol, remains safe on the unit  

## 2022-05-07 NOTE — Group Note (Signed)
LCSW Group Therapy Note  Group Date: 05/07/2022 Start Time: 1300 End Time: 1400   Type of Therapy and Topic:  Group Therapy - Healthy vs Unhealthy Coping Skills  Participation Level:  Did Not Attend   Description of Group The focus of this group was to determine what unhealthy coping techniques typically are used by group members and what healthy coping techniques would be helpful in coping with various problems. Patients were guided in becoming aware of the differences between healthy and unhealthy coping techniques. Patients were asked to identify 2-3 healthy coping skills they would like to learn to use more effectively.  Therapeutic Goals Patients learned that coping is what human beings do all day long to deal with various situations in their lives Patients defined and discussed healthy vs unhealthy coping techniques Patients identified their preferred coping techniques and identified whether these were healthy or unhealthy Patients determined 2-3 healthy coping skills they would like to become more familiar with and use more often. Patients provided support and ideas to each other   Summary of Patient Progress:   Patient did not attend group despite encouraged participation.    Therapeutic Modalities Cognitive Behavioral Therapy Motivational Interviewing  Larose Kells 05/07/2022  2:43 PM

## 2022-05-07 NOTE — BH IP Treatment Plan (Signed)
Interdisciplinary Treatment and Diagnostic Plan Update  05/07/2022 Time of Session: 08:30 Travis Sandoval MRN: [id number]  Principal Diagnosis: Schizophrenia [facility])  Secondary Diagnoses: Principal Problem:   Schizophrenia ([place]) Active Problems:   Psychosis ([place])   Current Medications:  Current Facility-Administered Medications  Medication Dose Route Frequency Provider Last Rate Last Admin   acetaminophen (TYLENOL) tablet 650 mg  650 mg Oral Q6H PRN [name], NP       alum & mag hydroxide-simeth (MAALOX/MYLANTA) 200-200-20 MG/5ML suspension 30 mL  30 mL Oral Q4H PRN [name], NP   30 mL at 05/05/22 1626   cloZAPine (CLOZARIL) tablet 150 mg  150 mg Oral QHS [name], [name], MD   150 mg at 05/06/22 2152   ziprasidone (GEODON) injection 20 mg  20 mg Intramuscular Q12H PRN [name], MD       And   LORazepam (ATIVAN) tablet 2 mg  2 mg Oral PRN [name], MD       magnesium hydroxide (MILK OF MAGNESIA) suspension 30 mL  30 mL Oral Daily PRN [name], NP       nicotine polacrilex (NICORETTE) gum 2 mg  2 mg Oral PRN [name], [name], MD   2 mg at 04/23/22 2200   [START ON 05/30/2022] paliperidone (INVEGA SUSTENNA) injection 156 mg  156 mg Intramuscular Q28 days [name], [name], MD       PTA Medications: Medications Prior to Admission  Medication Sig Dispense Refill Last Dose   ABILIFY MAINTENA 400 MG SRER injection Inject 400 mg into the muscle every 28 (twenty-eight) days.      ARIPiprazole (ABILIFY) 10 MG tablet Take 10 mg by mouth daily. (Patient not taking: Reported on 04/16/2022)      atomoxetine (STRATTERA) 40 MG capsule Take 40 mg by mouth daily. (Patient not taking: Reported on 04/16/2022)      atorvastatin (LIPITOR) 40 MG tablet Take 40 mg by mouth daily.      budesonide-formoterol (SYMBICORT) 160-4.5 MCG/ACT inhaler Inhale 2 puffs into the lungs.      cetirizine (ZYRTEC) 10 MG tablet Take 10 mg by mouth daily.      clozapine (CLOZARIL) 200 MG tablet  Take 200 mg by mouth 2 (two) times daily. Take along with one 25 mg tablet for total 225 mg twice daily      cloZAPine (CLOZARIL) 25 MG tablet Take 25 mg by mouth 2 (two) times daily. Take along with one 200 mg tablet for total 225 mg twice daily      Dexlansoprazole (DEXILANT) 30 MG capsule Take 30 mg by mouth daily. (Patient not taking: Reported on 04/16/2022)      hydrOXYzine (VISTARIL) 50 MG capsule Take 50 mg by mouth 3 (three) times daily as needed for itching. (Patient not taking: Reported on 04/16/2022)      Melatonin 5 MG TABS Take 5 mg by mouth at bedtime as needed (sleep).      metoprolol succinate (TOPROL-XL) 25 MG 24 hr tablet Take 12.5 mg by mouth daily.      Phosphatidyl Choline 65 MG TABS Take 1 tablet by mouth daily.  (Patient not taking: Reported on 04/16/2022)      valproic acid (DEPAKENE) 250 MG/5ML solution Take 750-1,000 mLs by mouth 2 (two) times daily. 1000 mg (20 mL) every morning and 750 mg (15 mL) daily at bedtime       Patient Stressors:    Patient Strengths:    Treatment Modalities: Medication Management, Group therapy,  Case management,  1 to 1 session with clinician, Psychoeducation, Recreational therapy.   Physician Treatment Plan for Primary Diagnosis: Schizophrenia (HCC) Long Term Goal(s):     Short Term Goals: None, pt is a poor hx.  Medication Management: Evaluate patient's response, side effects, and tolerance of medication regimen.  Therapeutic Interventions: 1 to 1 sessions, Unit Group sessions and Medication administration.  Evaluation of Outcomes: Progressing  Physician Treatment Plan for Secondary Diagnosis: Principal Problem:   Schizophrenia (HCC) Active Problems:   Psychosis (HCC)  Long Term Goal(s):     Short Term Goals: None, pt is a poor hx.     Medication Management: Evaluate patient's response, side effects, and tolerance of medication regimen.  Therapeutic Interventions: 1 to 1 sessions, Unit Group sessions and Medication  administration.  Evaluation of Outcomes: Progressing   RN Treatment Plan for Primary Diagnosis: Schizophrenia (HCC) Long Term Goal(s): Knowledge of disease and therapeutic regimen to maintain health will improve  Short Term Goals: Ability to demonstrate self-control, Ability to participate in decision making will improve, Ability to verbalize feelings will improve, Ability to disclose and discuss suicidal ideas, Ability to identify and develop effective coping behaviors will improve, and Compliance with prescribed medications will improve  Medication Management: RN will administer medications as ordered by provider, will assess and evaluate patient's response and provide education to patient for prescribed medication. RN will report any adverse and/or side effects to prescribing provider.  Therapeutic Interventions: 1 on 1 counseling sessions, Psychoeducation, Medication administration, Evaluate responses to treatment, Monitor vital signs and CBGs as ordered, Perform/monitor CIWA, COWS, AIMS and Fall Risk screenings as ordered, Perform wound care treatments as ordered.  Evaluation of Outcomes: Progressing   LCSW Treatment Plan for Primary Diagnosis: Schizophrenia (HCC) Long Term Goal(s): Safe transition to appropriate next level of care at discharge, Engage patient in therapeutic group addressing interpersonal concerns.  Short Term Goals: Engage patient in aftercare planning with referrals and resources, Increase social support, Increase ability to appropriately verbalize feelings, Increase emotional regulation, Facilitate acceptance of mental health diagnosis and concerns, and Increase skills for wellness and recovery  Therapeutic Interventions: Assess for all discharge needs, 1 to 1 time with Social worker, Explore available resources and support systems, Assess for adequacy in community support network, Educate family and significant other(s) on suicide prevention, Complete Psychosocial  Assessment, Interpersonal group therapy.  Evaluation of Outcomes: Progressing   Progress in Treatment: Attending groups: No. Participating in groups: No. Taking medication as prescribed: Yes. Toleration medication: Yes. Family/Significant other contact made: Yes, individual(s) contacted:  father, Jameer Storie. Patient understands diagnosis: No. Discussing patient identified problems/goals with staff: Yes. Medical problems stabilized or resolved: Yes. Denies suicidal/homicidal ideation: Yes. Issues/concerns per patient self-inventory: No. Other: none.  New problem(s) identified: No, Describe:  none Update 04/27/2022: No changes at this time.  Update 05/02/2022:   No changes at this time. Update 05/07/22: No changes at this time.    New Short Term/Long Term Goal(s): Patient to work towards elimination of symptoms of psychosis, medication management for mood stabilization; elimination of SI thoughts; development of comprehensive mental wellness plan. Update 04/27/2022: No changes at this time.  Update 05/02/2022:   No changes at this time. Update 05/07/22: No changes at this time.   Patient Goals:  No additional goals identified at this time. Patient to continue to work towards original goals identified in initial treatment team meeting. CSW will remain available to patient should they voice additional treatment goals. Update 04/27/2022: No changes at this time.  Update 05/02/2022:   No changes at this time. Update 05/07/22: No changes at this time.   Discharge Plan or Barriers: No psychosocial barriers identified at this time, patient to return to place of residence when appropriate for discharge. Update 04/27/2022: No changes at this time.  Update 05/02/2022:   Treatment team has discovered that the patient is not able to return to his group home and is in need of placement.  Meeting was held with treatment team and [facility] to assist patient in identifying appropriate housing. Update 05/07/22: No changes at this  time.   Reason for Continuation of Hospitalization: Medication stabilization Other; describe psychosis    Estimated Length of Stay: 1-7 days Update 04/27/2022: No changes at this time. Update 05/02/2022:   TBD Update 05/07/22: No changes at this time.  Last 3 [place] Suicide Severity Risk Score: Flowsheet Row Admission (Current) from 04/16/2022 in [facility] ED from 04/15/2022 in [facility] No Risk No Risk       Last PHQ 2/9 Scores:     No data to display          Scribe for Treatment Team: Shirl Harris, LCSW 05/07/2022 9:15 AM

## 2022-05-08 DIAGNOSIS — F203 Undifferentiated schizophrenia: Secondary | ICD-10-CM | POA: Diagnosis not present

## 2022-05-08 LAB — COMPREHENSIVE METABOLIC PANEL
ALT: 68 U/L — ABNORMAL HIGH (ref 0–44)
AST: 33 U/L (ref 15–41)
Albumin: 3.6 g/dL (ref 3.5–5.0)
Alkaline Phosphatase: 111 U/L (ref 38–126)
Anion gap: 9 (ref 5–15)
BUN: 17 mg/dL (ref 6–20)
CO2: 23 mmol/L (ref 22–32)
Calcium: 8.6 mg/dL — ABNORMAL LOW (ref 8.9–10.3)
Chloride: 106 mmol/L (ref 98–111)
Creatinine, Ser: 0.76 mg/dL (ref 0.61–1.24)
GFR, Estimated: >60 mL/min (ref >60)
Glucose, Bld: 121 mg/dL — ABNORMAL HIGH (ref 70–99)
Potassium: 3.9 mmol/L (ref 3.5–5.1)
Sodium: 138 mmol/L (ref 135–145)
Total Bilirubin: 0.7 mg/dL (ref 0.3–1.2)
Total Protein: 6.9 g/dL (ref 6.5–8.1)

## 2022-05-08 LAB — CBC WITH DIFFERENTIAL/PLATELET
Abs Immature Granulocytes: 0.05 10*3/uL (ref 0.00–0.07)
Basophils Absolute: 0 10*3/uL (ref 0.0–0.1)
Basophils Relative: 0 %
Eosinophils Absolute: 0.1 10*3/uL (ref 0.0–0.5)
Eosinophils Relative: 1 %
HCT: 43.6 % (ref 39.0–52.0)
Hemoglobin: 14.8 g/dL (ref 13.0–17.0)
Immature Granulocytes: 1 %
Lymphocytes Relative: 33 %
Lymphs Abs: 2.6 10*3/uL (ref 0.7–4.0)
MCH: 31.2 pg (ref 26.0–34.0)
MCHC: 33.9 g/dL (ref 30.0–36.0)
MCV: 92 fL (ref 80.0–100.0)
Monocytes Absolute: 0.4 10*3/uL (ref 0.1–1.0)
Monocytes Relative: 5 %
Neutro Abs: 4.8 10*3/uL (ref 1.7–7.7)
Neutrophils Relative %: 60 %
Platelets: 265 10*3/uL (ref 150–400)
RBC: 4.74 MIL/uL (ref 4.22–5.81)
RDW: 12.4 % (ref 11.5–15.5)
WBC: 8 10*3/uL (ref 4.0–10.5)
nRBC: 0 % (ref 0.0–0.2)

## 2022-05-08 NOTE — Progress Notes (Signed)
Recreation Therapy Notes  Date: 05/08/2022   Time: 10:25 am   Location: Craft room      Behavioral response: N/A   Intervention Topic: Stress Management    Discussion/Intervention: Patient refused to attend group.    Clinical Observations/Feedback:  Patient refused to attend group.    Audi Conover LRT/CTRS        Andon Villard 05/08/2022 12:04 PM

## 2022-05-08 NOTE — Consult Note (Signed)
Submitted ANC to clozapine [facility] program.   Eleonore Chiquito, PharmD, BCPS

## 2022-05-08 NOTE — Progress Notes (Signed)
Patient calm and pleasant during assessment denying SI/HI/AVH. Pt isoaltive to his room tonight but did come out for medication and snacks. Pt compliant with medication administration per MD orders. Pt given education, support, and encouragement to be acitve in his treatment plan. Pt being monitored Q 15 minutes for safety per unit protocol, remains safe on the unit

## 2022-05-08 NOTE — Progress Notes (Signed)
[facility] MD Progress Note  05/08/2022 10:25 AM Travis Sandoval  MRN:  [id number] Subjective: Follow-up with this gentleman with schizophrenia.  No new complaints.  Stays to himself mostly.  Saw him today sitting up in his room.  Engaged in enough conversation to see that he still has some delusional content but also at the same time understands that he is here in the hospital because of financing. Principal Problem: Schizophrenia ([place]) Diagnosis: Principal Problem:   Schizophrenia ([place]) Active Problems:   Psychosis ([place])  Total Time spent with patient: 30 minutes  Past Psychiatric History: Past history of schizophrenia  Past Medical History:  Past Medical History:  Diagnosis Date   ADHD    Bipolar 1 disorder ([place])    Hepatitis C    Schizoaffective disorder ([place])    History reviewed. No pertinent surgical history. Family History:  Family History  Family history unknown: Yes   Family Psychiatric  History: See previous Social History:  Social History   Substance and Sexual Activity  Alcohol Use Not Currently     Social History   Substance and Sexual Activity  Drug Use Not Currently    Social History   Socioeconomic History   Marital status: Unknown    Spouse name: Not on file   Number of children: Not on file   Years of education: Not on file   Highest education level: Not on file  Occupational History   Not on file  Tobacco Use   Smoking status: Every Day    Packs/day: 0.25    Years: 15.00    Total pack years: 3.75    Types: Cigarettes   Smokeless tobacco: Not on file  Vaping Use   Vaping Use: Not on file  Substance and Sexual Activity   Alcohol use: Not Currently   Drug use: Not Currently   Sexual activity: Not Currently  Other Topics Concern   Not on file  Social History Narrative   Not on file   Social Determinants of Health   Financial Resource Strain: Not on file  Food Insecurity: No Food Insecurity (04/16/2022)   Hunger Vital Sign    Worried About  Running Out of Food in the Last Year: Never true    Ran Out of Food in the Last Year: Never true  Transportation Needs: No Transportation Needs (04/16/2022)   PRAPARE - Hydrologist (Medical): No    Lack of Transportation (Non-Medical): No  Physical Activity: Not on file  Stress: Not on file  Social Connections: Not on file   Additional Social History:                         Sleep: Fair  Appetite:  Fair  Current Medications: Current Facility-Administered Medications  Medication Dose Route Frequency Provider Last Rate Last Admin   acetaminophen (TYLENOL) tablet 650 mg  650 mg Oral Q6H PRN [name], NP       alum & mag hydroxide-simeth (MAALOX/MYLANTA) 200-200-20 MG/5ML suspension 30 mL  30 mL Oral Q4H PRN [name], NP   30 mL at 05/08/22 0912   cloZAPine (CLOZARIL) tablet 150 mg  150 mg Oral QHS Ulys Favia T, MD   150 mg at 05/07/22 2111   ziprasidone (GEODON) injection 20 mg  20 mg Intramuscular Q12H PRN [name], MD       And   LORazepam (ATIVAN) tablet 2 mg  2 mg Oral PRN [name], MD  magnesium hydroxide (MILK OF MAGNESIA) suspension 30 mL  30 mL Oral Daily PRN Vanetta Mulders, NP       nicotine polacrilex (NICORETTE) gum 2 mg  2 mg Oral PRN Alesa Echevarria, [name], MD   2 mg at 04/23/22 2200   [START ON 05/30/2022] paliperidone (INVEGA SUSTENNA) injection 156 mg  156 mg Intramuscular Q28 days Breda Bond, [name], MD        Lab Results:  Results for orders placed or performed during the hospital encounter of 04/16/22 (from the past 48 hour(s))  CBC with Differential/Platelet     Status: None   Collection Time: 05/08/22  9:26 AM  Result Value Ref Range   WBC 8.0 4.0 - 10.5 K/uL   RBC 4.74 4.22 - 5.81 MIL/uL   Hemoglobin 14.8 13.0 - 17.0 g/dL   HCT 56.2 13.0 - 86.5 %   MCV 92.0 80.0 - 100.0 fL   MCH 31.2 26.0 - 34.0 pg   MCHC 33.9 30.0 - 36.0 g/dL   RDW 78.4 69.6 - 29.5 %   Platelets 265 150 - 400 K/uL    nRBC 0.0 0.0 - 0.2 %   Neutrophils Relative % 60 %   Neutro Abs 4.8 1.7 - 7.7 K/uL   Lymphocytes Relative 33 %   Lymphs Abs 2.6 0.7 - 4.0 K/uL   Monocytes Relative 5 %   Monocytes Absolute 0.4 0.1 - 1.0 K/uL   Eosinophils Relative 1 %   Eosinophils Absolute 0.1 0.0 - 0.5 K/uL   Basophils Relative 0 %   Basophils Absolute 0.0 0.0 - 0.1 K/uL   Immature Granulocytes 1 %   Abs Immature Granulocytes 0.05 0.00 - 0.07 K/uL    Comment: Performed at [facility], [address] [place], [place] [address]    Blood Alcohol level:  Lab Results  Component Value Date   [facility] <10 04/15/2022    Metabolic Disorder Labs: Lab Results  Component Value Date   HGBA1C 5.5 04/24/2022   MPG 111 04/24/2022   No results found for: "PROLACTIN" Lab Results  Component Value Date   CHOL 220 (H) 04/24/2022   TRIG 700 (H) 04/24/2022   HDL 35 (L) 04/24/2022   CHOLHDL 6.3 04/24/2022   VLDL UNABLE TO CALCULATE IF TRIGLYCERIDE OVER 400 mg/dL 24/40/1027   LDLCALC UNABLE TO CALCULATE IF TRIGLYCERIDE OVER 400 mg/dL 25/36/6440    Physical Findings: AIMS: Facial and Oral Movements Muscles of Facial Expression: None, normal Lips and Perioral Area: None, normal Jaw: None, normal Tongue: None, normal,Extremity Movements Upper (arms, wrists, hands, fingers): None, normal Lower (legs, knees, ankles, toes): None, normal, Trunk Movements Neck, shoulders, hips: None, normal, Overall Severity Severity of abnormal movements (highest score from questions above): None, normal Incapacitation due to abnormal movements: None, normal Patient's awareness of abnormal movements (rate only patient's report): No Awareness, Dental Status Current problems with teeth and/or dentures?: No Does patient usually wear dentures?: No  CIWA:    COWS:     Musculoskeletal: Strength & Muscle Tone: within normal limits Gait & Station: normal Patient leans: N/A  Psychiatric Specialty Exam:  Presentation  General  Appearance:  Disheveled  Eye Contact: Minimal  Speech: Pressured  Speech Volume: Normal  Handedness: Right   Mood and Affect  Mood: Euphoric; Irritable  Affect: Inappropriate   Thought Process  Thought Processes: Disorganized  Descriptions of Associations:Loose  Orientation:Partial  Thought Content:Illogical; Scattered  History of Schizophrenia/Schizoaffective disorder:No data recorded Duration of Psychotic Symptoms:No data recorded Hallucinations:No data recorded Ideas  of Reference:Delusions; Paranoia  Suicidal Thoughts:No data recorded Homicidal Thoughts:No data recorded  Sensorium  Memory: Immediate Poor; Recent Poor; Remote Poor  Judgment: Poor  Insight: Poor   Executive Functions  Concentration: Fair  Attention Span: Fair  Recall: Poor  Fund of Knowledge: Poor  Language: Poor   Psychomotor Activity  Psychomotor Activity:No data recorded  Assets  Assets: Desire for Improvement; Social Support   Sleep  Sleep:No data recorded   Physical Exam: Physical Exam Vitals and nursing note reviewed.  Constitutional:      Appearance: Normal appearance.  HENT:     Head: Normocephalic and atraumatic.     Mouth/Throat:     Pharynx: Oropharynx is clear.  Eyes:     Pupils: Pupils are equal, round, and reactive to light.  Cardiovascular:     Rate and Rhythm: Normal rate and regular rhythm.  Pulmonary:     Effort: Pulmonary effort is normal.     Breath sounds: Normal breath sounds.  Abdominal:     General: Abdomen is flat.     Palpations: Abdomen is soft.  Musculoskeletal:        General: Normal range of motion.  Skin:    General: Skin is warm and dry.  Neurological:     General: No focal deficit present.     Mental Status: He is alert. Mental status is at baseline.  Psychiatric:        Attention and Perception: Attention normal.        Mood and Affect: Mood normal. Affect is blunt.        Speech: Speech normal.         Behavior: Behavior is cooperative.        Thought Content: Thought content is delusional.    Review of Systems  Constitutional: Negative.   HENT: Negative.    Eyes: Negative.   Respiratory: Negative.    Cardiovascular: Negative.   Gastrointestinal: Negative.   Musculoskeletal: Negative.   Skin: Negative.   Neurological: Negative.   Psychiatric/Behavioral: Negative.     Blood pressure 122/77, pulse (!) 115, temperature 98.2 F (36.8 C), temperature source Oral, resp. rate 18, height 5\' 9"  (1.753 m), weight 96.4 kg, SpO2 98 %. Body mass index is 31.38 kg/m.   Treatment Plan Summary: Medication management and Plan no change to medication.  Encourage patient to be patient with [place].  Seems to be pretty stable probably at his baseline.  Funding is still being worked out to try and find a Hospital doctor.  Alethia Berthold, MD 05/08/2022, 10:25 AM

## 2022-05-08 NOTE — Plan of Care (Signed)
D: Patient alert and oriented. Patient denies pain. Patient denies anxiety and depression. Patient denies SI/HI/AVH. Patient requested PRN maalox for indigestion. Patient has been isolative to room during shift with exception to coming out for meals and requesting PRN medication.  A: Patient did not have any scheduled medication during this shift. Support and encouragement provided to patient.  Q15 minute safety checks maintained.   R: Patient compliant with medication administration and treatment plan. No adverse drug reactions noted. Patient remains safe on the unit at this time. Problem: Education: Goal: Knowledge of [facility] Education information/materials will improve Outcome: Progressing Goal: Verbalization of understanding the information provided will improve Outcome: Progressing   Problem: Health Behavior/Discharge Planning: Goal: Compliance with treatment plan for underlying cause of condition will improve Outcome: Progressing   Problem: Physical Regulation: Goal: Ability to maintain clinical measurements within normal limits will improve Outcome: Progressing   Problem: Education: Goal: Knowledge of the prescribed therapeutic regimen will improve Outcome: Progressing   Problem: Nutritional: Goal: Ability to achieve adequate nutritional intake will improve Outcome: Progressing   Problem: Role Relationship: Goal: Ability to communicate needs accurately will improve Outcome: Progressing

## 2022-05-08 NOTE — Group Note (Signed)
[facility] LCSW Group Therapy Note   Group Date: 05/08/2022 Start Time: 1300 End Time: 1400   Type of Therapy/Topic:  Group Therapy:  Emotion Regulation  Participation Level:  Did Not Attend   Mood:  Description of Group:    The purpose of this group is to assist patients in learning to regulate negative emotions and experience positive emotions. Patients will be guided to discuss ways in which they have been vulnerable to their negative emotions. These vulnerabilities will be juxtaposed with experiences of positive emotions or situations, and patients challenged to use positive emotions to combat negative ones. Special emphasis will be placed on coping with negative emotions in conflict situations, and patients will process healthy conflict resolution skills.  Therapeutic Goals: Patient will identify two positive emotions or experiences to reflect on in order to balance out negative emotions:  Patient will label two or more emotions that they find the most difficult to experience:  Patient will be able to demonstrate positive conflict resolution skills through discussion or role plays:   Summary of Patient Progress:   X    Therapeutic Modalities:   Cognitive Behavioral Therapy Feelings Identification Dialectical Behavioral Therapy   [place] Arabie J Sangeeta Youse, LCSW 

## 2022-05-08 NOTE — Progress Notes (Signed)
[facility] LCSW Progress Note   05/08/2022    2:25 PM  Travis Sandoval   [id number]   Type of Contact and Topic: Care Coordination   CSW met with patient about potentially returning to group home. Patient does not have a legal guardian. CSW discussed patient options, recommending he return to his current group home. Patient apprehensively agreed  and provided Probation officer with consent to reach out to group home owner Chesley Noon at [phone]. Unable to reach [place] owner, Left HIPAA compliant voicemail with contact information and callback request.   Situation ongoing, CSW will continue to monitor and update note as more information becomes available.       Signed:  Durenda Hurt, MSW, LCSWA, LCAS 05/08/2022 2:25 PM

## 2022-05-09 DIAGNOSIS — F203 Undifferentiated schizophrenia: Secondary | ICD-10-CM | POA: Diagnosis not present

## 2022-05-09 MED ORDER — CLOZAPINE 100 MG PO TABS
200.0000 mg | ORAL_TABLET | Freq: Every day | ORAL | Status: DC
  Administered 2022-05-09 – 2022-05-12 (×3): 200 mg via ORAL
  Filled 2022-05-09 (×4): qty 2

## 2022-05-09 MED ORDER — CLOZAPINE 200 MG PO TABS: 200.0000 mg | ORAL_TABLET | Freq: Every day | ORAL | 0 refills | Status: DC

## 2022-05-09 MED ORDER — NICOTINE POLACRILEX 2 MG MT GUM: 2.0000 mg | CHEWING_GUM | OROMUCOSAL | 0 refills | Status: DC | PRN

## 2022-05-09 MED ORDER — PALIPERIDONE PALMITATE ER 156 MG/ML IM SUSY: 156.0000 mg | PREFILLED_SYRINGE | INTRAMUSCULAR | 0 refills | Status: DC

## 2022-05-09 NOTE — [facility] Group Notes (Signed)
[facility] Group Notes:  (Nursing/MHT/Case Management/Adjunct)  Date:  05/09/2022  Time:  10:02 PM  Type of Therapy:   Wrap up  Participation Level:  Did Not Attend  Summary of Progress/Problems:  Nehemiah Settle 05/09/2022, 10:02 PM

## 2022-05-09 NOTE — Progress Notes (Signed)
Pt denies SI/HI/AVH and verbally agrees to approach staff if these become apparent or before harming themselves/others. Rates depression 0/10. Rates anxiety 0/10. Rates pain 0/10. Pt has been in his room all day. Group home is denying his return. Scheduled medications administered to pt, per MD orders. RN provided support and encouragement to pt. Q15 min safety checks implemented and continued. Pt safe on the unit. RN will continue to monitor and intervene as needed.  05/09/22 0750  Psych Admission Type (Psych Patients Only)  Admission Status Involuntary  Psychosocial Assessment  Patient Complaints None  Eye Contact Fair  Facial Expression Flat  Affect Flat  Speech Rapid;Soft  Interaction Minimal  Motor Activity Slow  Appearance/Hygiene Unremarkable  Behavior Characteristics Cooperative;Appropriate to situation;Calm  Mood Pleasant  Aggressive Behavior  Effect No apparent injury  Thought Process  Coherency WDL  Content WDL  Delusions None reported or observed  Perception WDL  Hallucination None reported or observed  Judgment Limited  Confusion None  Danger to Self  Current suicidal ideation? Denies  Danger to Others  Danger to Others None reported or observed

## 2022-05-09 NOTE — [facility] Counselor (Signed)
CSW reached out to [place], [phone], group home owner.  He reports that he will call this CSW back, that he is currently in the grocery store.   CSW did end up calling Elberta Fortis back.  Elberta Fortis reports that he will NOT allow patient to return to the home. He asked for Deloria Lair to give him a call.  CSW followed up with Deloria Lair who reports that she will call the group home back.  Assunta Curtis, MSW, LCSW 05/09/2022 3:37 PM

## 2022-05-09 NOTE — Group Note (Signed)
[facility] LCSW Group Therapy Note   Group Date: 05/09/2022 Start Time: 1300 End Time: 1400   Type of Therapy/Topic:  Group Therapy:  Balance in Life  Participation Level:  Did Not Attend   Description of Group:    This group will address the concept of balance and how it feels and looks when one is unbalanced. Patients will be encouraged to process areas in their lives that are out of balance, and identify reasons for remaining unbalanced. Facilitators will guide patients utilizing problem- solving interventions to address and correct the stressor making their life unbalanced. Understanding and applying boundaries will be explored and addressed for obtaining  and maintaining a balanced life. Patients will be encouraged to explore ways to assertively make their unbalanced needs known to significant others in their lives, using other group members and facilitator for support and feedback.  Therapeutic Goals: Patient will identify two or more emotions or situations they have that consume much of in their lives. Patient will identify signs/triggers that life has become out of balance:  Patient will identify two ways to set boundaries in order to achieve balance in their lives:  Patient will demonstrate ability to communicate their needs through discussion and/or role plays  Summary of Patient Progress: X   Therapeutic Modalities:   Cognitive Behavioral Therapy Solution-Focused Therapy Assertiveness Training   Dariush Mcnellis R Shakara Tweedy, LCSW 

## 2022-05-09 NOTE — Plan of Care (Signed)
  Problem: Education: Goal: Knowledge of General Education information will improve Description: Including pain rating scale, medication(s)/side effects and non-pharmacologic comfort measures Outcome: Adequate for Discharge   Problem: Coping: Goal: Level of anxiety will decrease Outcome: Adequate for Discharge   Problem: Education: Goal: Emotional status will improve Outcome: Adequate for Discharge Goal: Mental status will improve Outcome: Adequate for Discharge

## 2022-05-09 NOTE — Progress Notes (Signed)
[facility] LCSW Progress Note   05/09/2022    9:20 AM  [name]   [phone]   Type of Contact and Topic:  Group Home Contact   Writer attempted to reach Chesley Noon at [phone], group home manager to arrange discharge plans to return to home. 2nd attempt to reach group home manager. Left HIPAA compliant voicemail with contact information and callback request. Situation ongoing, CSW will continue to monitor and update note as more information becomes available.    Signed:  Durenda Hurt, MSW, LCSWA, LCAS 05/09/2022 9:20 AM

## 2022-05-09 NOTE — Progress Notes (Signed)
Patient calm and pleasant during assessment denying SI/HI/AVH. Pt isoaltive to his room tonight but did come out for medication and snacks. Pt compliant with medication administration per MD orders. Pt given education, support, and encouragement to be acitve in his treatment plan. Pt being monitored Q 15 minutes for safety per unit protocol, remains safe on the unit  

## 2022-05-09 NOTE — [facility] Suicide Risk Assessment (Signed)
[facility] Discharge Suicide Risk Assessment   Principal Problem: Schizophrenia [facility]) Discharge Diagnoses: Principal Problem:   Schizophrenia ([place]) Active Problems:   Psychosis ([place])   Total Time spent with patient: 30 minutes  Musculoskeletal: Strength & Muscle Tone: within normal limits Gait & Station: normal Patient leans: N/A  Psychiatric Specialty Exam  Presentation  General Appearance:  Disheveled  Eye Contact: Minimal  Speech: Pressured  Speech Volume: Normal  Handedness: Right   Mood and Affect  Mood: Euphoric; Irritable  Duration of Depression Symptoms: No data recorded Affect: Inappropriate   Thought Process  Thought Processes: Disorganized  Descriptions of Associations:Loose  Orientation:Partial  Thought Content:Illogical; Scattered  History of Schizophrenia/Schizoaffective disorder:No data recorded Duration of Psychotic Symptoms:No data recorded Hallucinations:No data recorded Ideas of Reference:Delusions; Paranoia  Suicidal Thoughts:No data recorded Homicidal Thoughts:No data recorded  Sensorium  Memory: Immediate Poor; Recent Poor; Remote Poor  Judgment: Poor  Insight: Poor   Executive Functions  Concentration: Fair  Attention Span: Fair  Recall: Poor  Fund of Knowledge: Poor  Language: Poor   Psychomotor Activity  Psychomotor Activity:No data recorded  Assets  Assets: Desire for Improvement; Social Support   Sleep  Sleep:No data recorded  Physical Exam: Physical Exam Vitals and nursing note reviewed.  Constitutional:      Appearance: Normal appearance.  HENT:     Head: Normocephalic and atraumatic.     Mouth/Throat:     Pharynx: Oropharynx is clear.  Eyes:     Pupils: Pupils are equal, round, and reactive to light.  Cardiovascular:     Rate and Rhythm: Normal rate and regular rhythm.  Pulmonary:     Effort: Pulmonary effort is normal.     Breath sounds: Normal breath sounds.  Abdominal:      General: Abdomen is flat.     Palpations: Abdomen is soft.  Musculoskeletal:        General: Normal range of motion.  Skin:    General: Skin is warm and dry.  Neurological:     General: No focal deficit present.     Mental Status: He is alert. Mental status is at baseline.  Psychiatric:        Attention and Perception: Attention normal.        Mood and Affect: Mood normal.        Speech: Speech normal.        Behavior: Behavior is cooperative.        Thought Content: Thought content normal.        Cognition and Memory: Cognition normal.    Review of Systems  Constitutional: Negative.   HENT: Negative.    Eyes: Negative.   Respiratory: Negative.    Cardiovascular: Negative.   Gastrointestinal: Negative.   Musculoskeletal: Negative.   Skin: Negative.   Neurological: Negative.   Psychiatric/Behavioral: Negative.     Blood pressure 120/82, pulse (!) 110, temperature 98 F (36.7 C), temperature source Oral, resp. rate 18, height 5\' 9"  (1.753 m), weight 96.4 kg, SpO2 97 %. Body mass index is 31.38 kg/m.  Mental Status Per Nursing Assessment::   On Admission:  NA  Demographic Factors:  Male  Loss Factors: Financial problems/change in socioeconomic status  Historical Factors: Impulsivity  Risk Reduction Factors:   Positive therapeutic relationship  Continued Clinical Symptoms:  Schizophrenia:   Paranoid or undifferentiated type  Cognitive Features That Contribute To Risk:  None    Suicide Risk:  Minimal: No identifiable suicidal ideation.  Patients presenting with no risk factors but with  morbid ruminations; may be classified as minimal risk based on the severity of the depressive symptoms    Plan Of Care/Follow-up recommendations:  Other:  Patient is to continue on current medication and follow-up with outpatient providers.  Plan is for discharge back to his group home.  No evidence of acute dangerousness.  Alethia Berthold, MD 05/09/2022, 10:14 AM

## 2022-05-09 NOTE — Discharge Summary (Signed)
Physician Discharge Summary Note  Patient:  Travis Sandoval is an 42 y.o., male MRN:  [id number] DOB:  [birth year] Patient phone:  [phone] (home)  Patient address:   [place] [address],  Total Time spent with patient: 30 minutes  Date of Admission:  04/16/2022 Date of Discharge: 05/09/2022  Reason for Admission: Patient was admitted after eloping from his group home being off his medicine and presenting with psychosis paranoia and some agitation  Principal Problem: Schizophrenia [facility]) Discharge Diagnoses: Principal Problem:   Schizophrenia ([place]) Active Problems:   Psychosis ([place])   Past Psychiatric History: History of chronic schizophrenia.  Little prior history available except that we know he had recently had a stay at [facility] and has chronic psychotic disorder.  Past Medical History:  Past Medical History:  Diagnosis Date   ADHD    Bipolar 1 disorder ([place])    Hepatitis C    Schizoaffective disorder ([place])    History reviewed. No pertinent surgical history. Family History:  Family History  Family history unknown: Yes   Family Psychiatric  History: None reported Social History:  Social History   Substance and Sexual Activity  Alcohol Use Not Currently     Social History   Substance and Sexual Activity  Drug Use Not Currently    Social History   Socioeconomic History   Marital status: Unknown    Spouse name: Not on file   Number of children: Not on file   Years of education: Not on file   Highest education level: Not on file  Occupational History   Not on file  Tobacco Use   Smoking status: Every Day    Packs/day: 0.25    Years: 15.00    Total pack years: 3.75    Types: Cigarettes   Smokeless tobacco: Not on file  Vaping Use   Vaping Use: Not on file  Substance and Sexual Activity   Alcohol use: Not Currently   Drug use: Not Currently   Sexual activity: Not Currently  Other Topics Concern   Not on file  Social History  Narrative   Not on file   Social Determinants of Health   Financial Resource Strain: Not on file  Food Insecurity: No Food Insecurity (04/16/2022)   Hunger Vital Sign    Worried About Running Out of Food in the Last Year: Never true    Ran Out of Food in the Last Year: Never true  Transportation Needs: No Transportation Needs (04/16/2022)   PRAPARE - Hydrologist (Medical): No    Lack of Transportation (Non-Medical): No  Physical Activity: Not on file  Stress: Not on file  Social Connections: Not on file    Hospital Course: Admitted to psychiatric hospital.  Patient did not display any violent or aggressive behavior on the unit and was not reporting any suicidal ideation or self-harm behavior.  Initially uncooperative with medicine and required negotiation and a brief period of forced medicine to start taking his medicine again.  After a few days of IM injections he became more compliant with oral medicine.  Now back on clozapine which has been gradually titrated up to 200 mg at night.  Patient also was started on Invega with the intention of getting him on the long-acting injectable.  He has had both doses of the loading schedule and tolerated it fine.  Plan would be for a next injection in early February.  Patient continues to show some signs of paranoia and delusional  thoughts in conversation but has not been hostile or argumentative.  He is agreeable to discharge back to his group home today.  We had some difficulty with placement because of financial problems at discharge but these seem to be resolved for the moment.  He says he will follow-up with [facility].  Physical Findings: AIMS: Facial and Oral Movements Muscles of Facial Expression: None, normal Lips and Perioral Area: None, normal Jaw: None, normal Tongue: None, normal,Extremity Movements Upper (arms, wrists, hands, fingers): None, normal Lower (legs, knees, ankles, toes): None, normal,  Trunk Movements Neck, shoulders, hips: None, normal, Overall Severity Severity of abnormal movements (highest score from questions above): None, normal Incapacitation due to abnormal movements: None, normal Patient's awareness of abnormal movements (rate only patient's report): No Awareness, Dental Status Current problems with teeth and/or dentures?: No Does patient usually wear dentures?: No  CIWA:    COWS:     Musculoskeletal: Strength & Muscle Tone: within normal limits Gait & Station: normal Patient leans: N/A   Psychiatric Specialty Exam:  Presentation  General Appearance:  Disheveled  Eye Contact: Minimal  Speech: Pressured  Speech Volume: Normal  Handedness: Right   Mood and Affect  Mood: Euphoric; Irritable  Affect: Inappropriate   Thought Process  Thought Processes: Disorganized  Descriptions of Associations:Loose  Orientation:Partial  Thought Content:Illogical; Scattered  History of Schizophrenia/Schizoaffective disorder:No data recorded Duration of Psychotic Symptoms:No data recorded Hallucinations:No data recorded Ideas of Reference:Delusions; Paranoia  Suicidal Thoughts:No data recorded Homicidal Thoughts:No data recorded  Sensorium  Memory: Immediate Poor; Recent Poor; Remote Poor  Judgment: Poor  Insight: Poor   Executive Functions  Concentration: Fair  Attention Span: Fair  Recall: Poor  Fund of Knowledge: Poor  Language: Poor   Psychomotor Activity  Psychomotor Activity:No data recorded  Assets  Assets: Desire for Improvement; Social Support   Sleep  Sleep:No data recorded   Physical Exam: Physical Exam Vitals and nursing note reviewed.  Constitutional:      Appearance: Normal appearance.  HENT:     Head: Normocephalic and atraumatic.     Mouth/Throat:     Pharynx: Oropharynx is clear.  Eyes:     Pupils: Pupils are equal, round, and reactive to light.  Cardiovascular:     Rate and Rhythm:  Normal rate and regular rhythm.  Pulmonary:     Effort: Pulmonary effort is normal.     Breath sounds: Normal breath sounds.  Abdominal:     General: Abdomen is flat.     Palpations: Abdomen is soft.  Musculoskeletal:        General: Normal range of motion.  Skin:    General: Skin is warm and dry.  Neurological:     General: No focal deficit present.     Mental Status: He is alert. Mental status is at baseline.  Psychiatric:        Attention and Perception: Attention normal.        Mood and Affect: Mood normal.        Speech: Speech normal.        Behavior: Behavior is cooperative.        Thought Content: Thought content is delusional.        Cognition and Memory: Cognition normal.    Review of Systems  Constitutional: Negative.   HENT: Negative.    Eyes: Negative.   Respiratory: Negative.    Cardiovascular: Negative.   Gastrointestinal: Negative.   Musculoskeletal: Negative.   Skin: Negative.   Neurological: Negative.  Psychiatric/Behavioral: Negative.     Blood pressure 120/82, pulse (!) 110, temperature 98 F (36.7 C), temperature source Oral, resp. rate 18, height 5\' 9"  (1.753 m), weight 96.4 kg, SpO2 97 %. Body mass index is 31.38 kg/m.   Social History   Tobacco Use  Smoking Status Every Day   Packs/day: 0.25   Years: 15.00   Total pack years: 3.75   Types: Cigarettes  Smokeless Tobacco Not on file   Tobacco Cessation:  A prescription for an FDA-approved tobacco cessation medication provided at discharge   Blood Alcohol level:  Lab Results  Component Value Date   ETH <10 21/30/8657    Metabolic Disorder Labs:  Lab Results  Component Value Date   HGBA1C 5.5 04/24/2022   MPG 111 04/24/2022   No results found for: "PROLACTIN" Lab Results  Component Value Date   CHOL 220 (H) 04/24/2022   TRIG 700 (H) 04/24/2022   HDL 35 (L) 04/24/2022   CHOLHDL 6.3 04/24/2022   VLDL UNABLE TO CALCULATE IF TRIGLYCERIDE OVER 400 mg/dL 04/24/2022   LDLCALC  UNABLE TO CALCULATE IF TRIGLYCERIDE OVER 400 mg/dL 04/24/2022    See Psychiatric Specialty Exam and Suicide Risk Assessment completed by Attending Physician prior to discharge.  Discharge destination:  Home  Is patient on multiple antipsychotic therapies at discharge:  Yes,   Do you recommend tapering to monotherapy for antipsychotics?  No   Has Patient had three or more failed trials of antipsychotic monotherapy by history:  Yes,   Antipsychotic medications that previously failed include:   1.  Clozapine., 2.  Abilify., and 3.  Olanzapine.  Recommended Plan for Multiple Antipsychotic Therapies: Second antipsychotic is Clozapine.  Reason for adding Clozapine patient had previously been stabilized on clozapine but also on a long-acting injectable and other antipsychotics for full benefit  Discharge Instructions     Diet - low sodium heart healthy   Complete by: As directed    Increase activity slowly   Complete by: As directed       Allergies as of 05/09/2022       Reactions   Codeine Other (See Comments)   Causes fever        Medication List     STOP taking these medications    Abilify Maintena 400 MG Srer injection Generic drug: ARIPiprazole ER   ARIPiprazole 10 MG tablet Commonly known as: ABILIFY   atomoxetine 40 MG capsule Commonly known as: STRATTERA   atorvastatin 40 MG tablet Commonly known as: LIPITOR   budesonide-formoterol 160-4.5 MCG/ACT inhaler Commonly known as: SYMBICORT   cetirizine 10 MG tablet Commonly known as: ZYRTEC   Dexilant 30 MG capsule DR Generic drug: Dexlansoprazole   hydrOXYzine 50 MG capsule Commonly known as: VISTARIL   melatonin 5 MG Tabs   metoprolol succinate 25 MG 24 hr tablet Commonly known as: TOPROL-XL   Phosphatidyl Choline 65 MG Tabs   valproic acid 250 MG/5ML solution Commonly known as: DEPAKENE       TAKE these medications      Indication  clozapine 200 MG tablet Commonly known as: CLOZARIL Take 1  tablet (200 mg total) by mouth at bedtime. What changed:  when to take this additional instructions Another medication with the same name was removed. Continue taking this medication, and follow the directions you see here.  Indication: Schizophrenia that does Not Respond to Usual Drug Therapy   nicotine polacrilex 2 MG gum Commonly known as: NICORETTE Take 1 each (2 mg total) by mouth  as needed for smoking cessation.  Indication: Nicotine Addiction   paliperidone 156 MG/ML Susy injection Commonly known as: INVEGA SUSTENNA Inject 1 mL (156 mg total) into the muscle every 28 (twenty-eight) days. Start taking on: May 30, 2022  Indication: Schizophrenia         Follow-up recommendations:  Other:  The most recent CBC was done the day before discharge.  Patient will follow-up with his usual outpatient provider.  Prescriptions provided.  Comments: See above.  Financial situation seems to have been resolved with him able to return to his group home.  Signed: Mordecai Rasmussen, MD 05/09/2022, 10:19 AM

## 2022-05-10 DIAGNOSIS — F203 Undifferentiated schizophrenia: Secondary | ICD-10-CM | POA: Diagnosis not present

## 2022-05-10 NOTE — Group Note (Signed)
LCSW Group Therapy Note  Group Date: 05/10/2022 Start Time: 1300 End Time: 1400   Type of Therapy and Topic:  Group Therapy - Healthy vs Unhealthy Coping Skills  Participation Level:  Did Not Attend   Description of Group The focus of this group was to determine what unhealthy coping techniques typically are used by group members and what healthy coping techniques would be helpful in coping with various problems. Patients were guided in becoming aware of the differences between healthy and unhealthy coping techniques. Patients were asked to identify 2-3 healthy coping skills they would like to learn to use more effectively.  Therapeutic Goals Patients learned that coping is what human beings do all day long to deal with various situations in their lives Patients defined and discussed healthy vs unhealthy coping techniques Patients identified their preferred coping techniques and identified whether these were healthy or unhealthy Patients determined 2-3 healthy coping skills they would like to become more familiar with and use more often. Patients provided support and ideas to each other   Summary of Patient Progress:    Patient did not attend group despite encouraged participation.   Therapeutic Modalities Cognitive Behavioral Therapy Motivational Interviewing  Larose Kells 05/10/2022  2:51 PM

## 2022-05-10 NOTE — Plan of Care (Signed)
  Problem: Education: Goal: Knowledge of General Education information will improve Description: Including pain rating scale, medication(s)/side effects and non-pharmacologic comfort measures 05/10/2022 1332 by Gerrianne Scale, RN Outcome: Progressing 05/10/2022 1331 by Gerrianne Scale, RN Reactivated   Problem: Clinical Measurements: Goal: Ability to maintain clinical measurements within normal limits will improve 05/10/2022 1332 by Gerrianne Scale, RN Outcome: Progressing 05/10/2022 1331 by Gerrianne Scale, RN Reactivated Goal: Diagnostic test results will improve 05/10/2022 1332 by Gerrianne Scale, RN Outcome: Progressing 05/10/2022 1331 by Gerrianne Scale, RN Reactivated   Problem: Nutrition: Goal: Adequate nutrition will be maintained 05/10/2022 1332 by Gerrianne Scale, RN Outcome: Progressing 05/10/2022 1331 by Gerrianne Scale, RN Reactivated   Problem: Coping: Goal: Level of anxiety will decrease 05/10/2022 1332 by Gerrianne Scale, RN Outcome: Progressing 05/10/2022 1331 by Gerrianne Scale, RN Reactivated   Problem: Activity: Goal: Risk for activity intolerance will decrease 05/10/2022 1332 by Gerrianne Scale, RN Outcome: Not Progressing 05/10/2022 1331 by Gerrianne Scale, RN Reactivated   Problem: Elimination: Goal: Will not experience complications related to bowel motility 05/10/2022 1332 by Gerrianne Scale, RN Outcome: Completed/Met 05/10/2022 1331 by Gerrianne Scale, RN Reactivated

## 2022-05-10 NOTE — Progress Notes (Signed)
[facility] MD Progress Note  05/10/2022 4:41 PM Travis Sandoval  MRN:  [id number] Subjective: No change.  Denies hallucinations.  Still has some paranoid thoughts at times.  Not aggressive.  Paying attention enough that he understands his current discharge plan. Principal Problem: Schizophrenia ([place]) Diagnosis: Principal Problem:   Schizophrenia ([place]) Active Problems:   Psychosis ([place])  Total Time spent with patient: 30 minutes  Past Psychiatric History: Past history of schizophrenia  Past Medical History:  Past Medical History:  Diagnosis Date   ADHD    Bipolar 1 disorder ([place])    Hepatitis C    Schizoaffective disorder ([place])    History reviewed. No pertinent surgical history. Family History:  Family History  Family history unknown: Yes   Family Psychiatric  History: See previous Social History:  Social History   Substance and Sexual Activity  Alcohol Use Not Currently     Social History   Substance and Sexual Activity  Drug Use Not Currently    Social History   Socioeconomic History   Marital status: Unknown    Spouse name: Not on file   Number of children: Not on file   Years of education: Not on file   Highest education level: Not on file  Occupational History   Not on file  Tobacco Use   Smoking status: Every Day    Packs/day: 0.25    Years: 15.00    Total pack years: 3.75    Types: Cigarettes   Smokeless tobacco: Not on file  Vaping Use   Vaping Use: Not on file  Substance and Sexual Activity   Alcohol use: Not Currently   Drug use: Not Currently   Sexual activity: Not Currently  Other Topics Concern   Not on file  Social History Narrative   Not on file   Social Determinants of Health   Financial Resource Strain: Not on file  Food Insecurity: No Food Insecurity (04/16/2022)   Hunger Vital Sign    Worried About Running Out of Food in the Last Year: Never true    Ran Out of Food in the Last Year: Never true  Transportation Needs: No Transportation  Needs (04/16/2022)   PRAPARE - Hydrologist (Medical): No    Lack of Transportation (Non-Medical): No  Physical Activity: Not on file  Stress: Not on file  Social Connections: Not on file   Additional Social History:                         Sleep: Fair  Appetite:  Good  Current Medications: Current Facility-Administered Medications  Medication Dose Route Frequency Provider Last Rate Last Admin   acetaminophen (TYLENOL) tablet 650 mg  650 mg Oral Q6H PRN [name], NP       alum & mag hydroxide-simeth (MAALOX/MYLANTA) 200-200-20 MG/5ML suspension 30 mL  30 mL Oral Q4H PRN [name], NP   30 mL at 05/08/22 0912   cloZAPine (CLOZARIL) tablet 200 mg  200 mg Oral QHS Kariah Loredo T, MD   200 mg at 05/09/22 2109   ziprasidone (GEODON) injection 20 mg  20 mg Intramuscular Q12H PRN [name], MD       And   LORazepam (ATIVAN) tablet 2 mg  2 mg Oral PRN [name], MD       magnesium hydroxide (MILK OF MAGNESIA) suspension 30 mL  30 mL Oral Daily PRN [name], NP  nicotine polacrilex (NICORETTE) gum 2 mg  2 mg Oral PRN Lynea Rollison T, MD   2 mg at 04/23/22 2200   [START ON 05/30/2022] paliperidone (INVEGA SUSTENNA) injection 156 mg  156 mg Intramuscular Q28 days Darcia Lampi, [name], MD        Lab Results: No results found for this or any previous visit (from the past 48 hour(s)).  Blood Alcohol level:  Lab Results  Component Value Date   ETH <10 04/15/2022    Metabolic Disorder Labs: Lab Results  Component Value Date   HGBA1C 5.5 04/24/2022   MPG 111 04/24/2022   No results found for: "PROLACTIN" Lab Results  Component Value Date   CHOL 220 (H) 04/24/2022   TRIG 700 (H) 04/24/2022   HDL 35 (L) 04/24/2022   CHOLHDL 6.3 04/24/2022   VLDL UNABLE TO CALCULATE IF TRIGLYCERIDE OVER 400 mg/dL 09/81/1914   LDLCALC UNABLE TO CALCULATE IF TRIGLYCERIDE OVER 400 mg/dL 78/29/5621    Physical Findings: AIMS:  Facial and Oral Movements Muscles of Facial Expression: None, normal Lips and Perioral Area: None, normal Jaw: None, normal Tongue: None, normal,Extremity Movements Upper (arms, wrists, hands, fingers): None, normal Lower (legs, knees, ankles, toes): None, normal, Trunk Movements Neck, shoulders, hips: None, normal, Overall Severity Severity of abnormal movements (highest score from questions above): None, normal Incapacitation due to abnormal movements: None, normal Patient's awareness of abnormal movements (rate only patient's report): No Awareness, Dental Status Current problems with teeth and/or dentures?: No Does patient usually wear dentures?: No  CIWA:    COWS:     Musculoskeletal: Strength & Muscle Tone: within normal limits Gait & Station: normal Patient leans: N/A  Psychiatric Specialty Exam:  Presentation  General Appearance:  Disheveled  Eye Contact: Minimal  Speech: Pressured  Speech Volume: Normal  Handedness: Right   Mood and Affect  Mood: Euphoric; Irritable  Affect: Inappropriate   Thought Process  Thought Processes: Disorganized  Descriptions of Associations:Loose  Orientation:Partial  Thought Content:Illogical; Scattered  History of Schizophrenia/Schizoaffective disorder:No data recorded Duration of Psychotic Symptoms:No data recorded Hallucinations:No data recorded Ideas of Reference:Delusions; Paranoia  Suicidal Thoughts:No data recorded Homicidal Thoughts:No data recorded  Sensorium  Memory: Immediate Poor; Recent Poor; Remote Poor  Judgment: Poor  Insight: Poor   Executive Functions  Concentration: Fair  Attention Span: Fair  Recall: Poor  Fund of Knowledge: Poor  Language: Poor   Psychomotor Activity  Psychomotor Activity:No data recorded  Assets  Assets: Desire for Improvement; Social Support   Sleep  Sleep:No data recorded   Physical Exam: Physical Exam Vitals and nursing note  reviewed.  Constitutional:      Appearance: Normal appearance.  HENT:     Head: Normocephalic and atraumatic.     Mouth/Throat:     Pharynx: Oropharynx is clear.  Eyes:     Pupils: Pupils are equal, round, and reactive to light.  Cardiovascular:     Rate and Rhythm: Normal rate and regular rhythm.  Pulmonary:     Effort: Pulmonary effort is normal.     Breath sounds: Normal breath sounds.  Abdominal:     General: Abdomen is flat.     Palpations: Abdomen is soft.  Musculoskeletal:        General: Normal range of motion.  Skin:    General: Skin is warm and dry.  Neurological:     General: No focal deficit present.     Mental Status: He is alert. Mental status is at baseline.  Psychiatric:  Mood and Affect: Mood normal.        Speech: Speech is delayed.        Behavior: Behavior is withdrawn.        Thought Content: Thought content normal.    Review of Systems  Constitutional: Negative.   HENT: Negative.    Eyes: Negative.   Respiratory: Negative.    Cardiovascular: Negative.   Gastrointestinal: Negative.   Musculoskeletal: Negative.   Skin: Negative.   Neurological: Negative.   Psychiatric/Behavioral: Negative.     Blood pressure 136/87, pulse (!) 116, temperature 97.8 F (36.6 C), temperature source Oral, resp. rate 18, height 5\' 9"  (1.753 m), weight 96.4 kg, SpO2 97 %. Body mass index is 31.38 kg/m.   Treatment Plan Summary: Plan continue current dose of clozapine.  Might go up in another couple days but the 200 is a nice round dose that helps with compliance.  Lower number of pills.  We are still working on discharge placement  Alethia Berthold, MD 05/10/2022, 4:41 PM

## 2022-05-10 NOTE — Progress Notes (Signed)
Pt denies SI/HI/AVH and verbally agrees to approach staff if these become apparent or before harming themselves/others. Rates depression 0/10. Rates anxiety 0/10. Rates pain 0/10. Pt has been in his room all day. Pt comes out for meals. Scheduled medications administered to pt, per MD orders. RN provided support and encouragement to pt. Q15 min safety checks implemented and continued. Pt safe on the unit. RN will continue to monitor and intervene as needed.  05/10/22 0800  Psych Admission Type (Psych Patients Only)  Admission Status Involuntary  Psychosocial Assessment  Patient Complaints None  Eye Contact Fair  Facial Expression Flat  Affect Flat  Speech Logical/coherent  Interaction Minimal;Isolative  Motor Activity Other (Comment) (WDL)  Appearance/Hygiene In scrubs;Unremarkable  Behavior Characteristics Cooperative;Appropriate to situation;Calm  Mood Pleasant  Thought Process  Coherency Disorganized  Content WDL  Delusions None reported or observed  Perception WDL  Hallucination None reported or observed  Judgment Limited  Confusion None  Danger to Self  Current suicidal ideation? Denies  Danger to Others  Danger to Others None reported or observed

## 2022-05-11 DIAGNOSIS — F203 Undifferentiated schizophrenia: Secondary | ICD-10-CM | POA: Diagnosis not present

## 2022-05-11 NOTE — Plan of Care (Signed)
  Problem: Pain Managment: Goal: General experience of comfort will improve Outcome: Progressing   Problem: Safety: Goal: Ability to remain free from injury will improve Outcome: Progressing   Problem: Skin Integrity: Goal: Risk for impaired skin integrity will decrease Outcome: Progressing

## 2022-05-11 NOTE — [facility] Group Notes (Signed)
LCSW Group Therapy Note   05/11/2022 1:15pm   Type of Therapy and Topic:  Group Therapy:  Overcoming Obstacles   Participation Level:  Did Not Attend   Description of Group:    In this group patients will be encouraged to explore what they see as obstacles to their own wellness and recovery. They will be guided to discuss their thoughts, feelings, and behaviors related to these obstacles. The group will process together ways to cope with barriers, with attention given to specific choices patients can make. Each patient will be challenged to identify changes they are motivated to make in order to overcome their obstacles. This group will be process-oriented, with patients participating in exploration of their own experiences as well as giving and receiving support and challenge from other group members.   Therapeutic Goals: Patient will identify personal and current obstacles as they relate to admission. Patient will identify barriers that currently interfere with their wellness or overcoming obstacles.  Patient will identify feelings, thought process and behaviors related to these barriers. Patient will identify two changes they are willing to make to overcome these obstacles:      Summary of Patient Progress      Therapeutic Modalities:   Cognitive Behavioral Therapy Solution Focused Therapy Motivational Interviewing Relapse Prevention Therapy  Joanne Chars, LCSW 05/11/2022 2:51 PM

## 2022-05-11 NOTE — Progress Notes (Signed)
Patient alert and oriented x 4, affect is blunted no distress noted. Patient was receptive to staff, he appears less anxious still isolates to his room except during snack. Patient denies SI/HI/AVH. He was noted asleep during medication pass, 15 minutes safety checks maintained will continue to monitor.

## 2022-05-11 NOTE — Progress Notes (Signed)
[facility] MD Progress Note  05/11/2022 1:06 PM Travis Sandoval  MRN:  [id number] Subjective: No new complaints.  Patient mostly stays to himself but has not had any behavior issues.  Able to hold superficial conversations fairly rationally. Principal Problem: Schizophrenia ([place]) Diagnosis: Principal Problem:   Schizophrenia ([place]) Active Problems:   Psychosis ([place])  Total Time spent with patient: 20 minutes  Past Psychiatric History: History of schizophrenia  Past Medical History:  Past Medical History:  Diagnosis Date   ADHD    Bipolar 1 disorder ([place])    Hepatitis C    Schizoaffective disorder ([place])    History reviewed. No pertinent surgical history. Family History:  Family History  Family history unknown: Yes   Family Psychiatric  History: See previous Social History:  Social History   Substance and Sexual Activity  Alcohol Use Not Currently     Social History   Substance and Sexual Activity  Drug Use Not Currently    Social History   Socioeconomic History   Marital status: Unknown    Spouse name: Not on file   Number of children: Not on file   Years of education: Not on file   Highest education level: Not on file  Occupational History   Not on file  Tobacco Use   Smoking status: Every Day    Packs/day: 0.25    Years: 15.00    Total pack years: 3.75    Types: Cigarettes   Smokeless tobacco: Not on file  Vaping Use   Vaping Use: Not on file  Substance and Sexual Activity   Alcohol use: Not Currently   Drug use: Not Currently   Sexual activity: Not Currently  Other Topics Concern   Not on file  Social History Narrative   Not on file   Social Determinants of Health   Financial Resource Strain: Not on file  Food Insecurity: No Food Insecurity (04/16/2022)   Hunger Vital Sign    Worried About Running Out of Food in the Last Year: Never true    Ran Out of Food in the Last Year: Never true  Transportation Needs: No Transportation Needs (04/16/2022)    PRAPARE - Hydrologist (Medical): No    Lack of Transportation (Non-Medical): No  Physical Activity: Not on file  Stress: Not on file  Social Connections: Not on file   Additional Social History:                         Sleep: Fair  Appetite:  Fair  Current Medications: Current Facility-Administered Medications  Medication Dose Route Frequency Provider Last Rate Last Admin   acetaminophen (TYLENOL) tablet 650 mg  650 mg Oral Q6H PRN [name], NP       alum & mag hydroxide-simeth (MAALOX/MYLANTA) 200-200-20 MG/5ML suspension 30 mL  30 mL Oral Q4H PRN [name], NP   30 mL at 05/08/22 0912   cloZAPine (CLOZARIL) tablet 200 mg  200 mg Oral QHS [name], MD   200 mg at 05/09/22 2109   ziprasidone (GEODON) injection 20 mg  20 mg Intramuscular Q12H PRN [name], MD       And   LORazepam (ATIVAN) tablet 2 mg  2 mg Oral PRN [name], MD       magnesium hydroxide (MILK OF MAGNESIA) suspension 30 mL  30 mL Oral Daily PRN [name], NP       nicotine polacrilex (  NICORETTE) gum 2 mg  2 mg Oral PRN [name], MD   2 mg at 04/23/22 2200   [START ON 05/30/2022] paliperidone (INVEGA SUSTENNA) injection 156 mg  156 mg Intramuscular Q28 days [name], [name], MD        Lab Results: No results found for this or any previous visit (from the past 48 hour(s)).  Blood Alcohol level:  Lab Results  Component Value Date   ETH <10 04/15/2022    Metabolic Disorder Labs: Lab Results  Component Value Date   HGBA1C 5.5 04/24/2022   MPG 111 04/24/2022   No results found for: "PROLACTIN" Lab Results  Component Value Date   CHOL 220 (H) 04/24/2022   TRIG 700 (H) 04/24/2022   HDL 35 (L) 04/24/2022   CHOLHDL 6.3 04/24/2022   VLDL UNABLE TO CALCULATE IF TRIGLYCERIDE OVER 400 mg/dL 62/37/6283   LDLCALC UNABLE TO CALCULATE IF TRIGLYCERIDE OVER 400 mg/dL 15/17/6160    Physical Findings: AIMS: Facial and Oral  Movements Muscles of Facial Expression: None, normal Lips and Perioral Area: None, normal Jaw: None, normal Tongue: None, normal,Extremity Movements Upper (arms, wrists, hands, fingers): None, normal Lower (legs, knees, ankles, toes): None, normal, Trunk Movements Neck, shoulders, hips: None, normal, Overall Severity Severity of abnormal movements (highest score from questions above): None, normal Incapacitation due to abnormal movements: None, normal Patient's awareness of abnormal movements (rate only patient's report): No Awareness, Dental Status Current problems with teeth and/or dentures?: No Does patient usually wear dentures?: No  CIWA:    COWS:     Musculoskeletal: Strength & Muscle Tone: within normal limits Gait & Station: normal Patient leans: N/A  Psychiatric Specialty Exam:  Presentation  General Appearance:  Disheveled  Eye Contact: Minimal  Speech: Pressured  Speech Volume: Normal  Handedness: Right   Mood and Affect  Mood: Euphoric; Irritable  Affect: Inappropriate   Thought Process  Thought Processes: Disorganized  Descriptions of Associations:Loose  Orientation:Partial  Thought Content:Illogical; Scattered  History of Schizophrenia/Schizoaffective disorder:No data recorded Duration of Psychotic Symptoms:No data recorded Hallucinations:No data recorded Ideas of Reference:Delusions; Paranoia  Suicidal Thoughts:No data recorded Homicidal Thoughts:No data recorded  Sensorium  Memory: Immediate Poor; Recent Poor; Remote Poor  Judgment: Poor  Insight: Poor   Executive Functions  Concentration: Fair  Attention Span: Fair  Recall: Poor  Fund of Knowledge: Poor  Language: Poor   Psychomotor Activity  Psychomotor Activity:No data recorded  Assets  Assets: Desire for Improvement; Social Support   Sleep  Sleep:No data recorded   Physical Exam: Physical Exam Vitals and nursing note reviewed.   Constitutional:      Appearance: Normal appearance.  HENT:     Head: Normocephalic and atraumatic.     Mouth/Throat:     Pharynx: Oropharynx is clear.  Eyes:     Pupils: Pupils are equal, round, and reactive to light.  Cardiovascular:     Rate and Rhythm: Normal rate and regular rhythm.  Pulmonary:     Effort: Pulmonary effort is normal.     Breath sounds: Normal breath sounds.  Abdominal:     General: Abdomen is flat.     Palpations: Abdomen is soft.  Musculoskeletal:        General: Normal range of motion.  Skin:    General: Skin is warm and dry.  Neurological:     General: No focal deficit present.     Mental Status: He is alert. Mental status is at baseline.  Psychiatric:  Attention and Perception: He is inattentive.        Mood and Affect: Mood normal. Affect is blunt.        Speech: Speech is delayed.        Behavior: Behavior is slowed.        Thought Content: Thought content is paranoid.    Review of Systems  Constitutional: Negative.   HENT: Negative.    Eyes: Negative.   Respiratory: Negative.    Cardiovascular: Negative.   Gastrointestinal: Negative.   Musculoskeletal: Negative.   Skin: Negative.   Neurological: Negative.   Psychiatric/Behavioral: Negative.     Blood pressure 113/77, pulse 79, temperature 97.9 F (36.6 C), temperature source Oral, resp. rate 18, height 5\' 9"  (1.753 m), weight 96.4 kg, SpO2 97 %. Body mass index is 31.38 kg/m.   Treatment Plan Summary: Medication management and Plan patient appears to be stable and we are largely waiting on discharge.  Although in the past he had been on higher doses of clozapine I would like to probably check a level before pushing the dose up.  Right now he does not seem to be having many if any side effects from it and 200 is a very even dose requiring just a single pill.  I am going to check a clozapine level tomorrow I expect it will come back before we have any discharge in place for  him.  Alethia Berthold, MD 05/11/2022, 1:06 PM

## 2022-05-11 NOTE — Progress Notes (Addendum)
Patient is A+O x4. He denies SI/HI/AVH. He denies anxiety and depression. Flat affect but pleasant mood. Patient is mainly isolative to his room but is cooperative to care. No scheduled meds on this shift. No PRN's adm. Patient had no complaints. Clozaril draw in the morning.  Q15 minute unit checks in place.

## 2022-05-12 DIAGNOSIS — F203 Undifferentiated schizophrenia: Secondary | ICD-10-CM | POA: Diagnosis not present

## 2022-05-12 NOTE — Plan of Care (Signed)

## 2022-05-12 NOTE — BH IP Treatment Plan (Signed)
Interdisciplinary Treatment and Diagnostic Plan Update  05/12/2022 Time of Session: 10:00am Travis Sandoval MRN: [id number]  Principal Diagnosis: Schizophrenia [name])  Secondary Diagnoses: Principal Problem:   Schizophrenia ([name]) Active Problems:   Psychosis ([name])   Current Medications:  Current Facility-Administered Medications  Medication Dose Route Frequency Provider Last Rate Last Admin   acetaminophen (TYLENOL) tablet 650 mg  650 mg Oral Q6H PRN [name], NP       alum & mag hydroxide-simeth (MAALOX/MYLANTA) 200-200-20 MG/5ML suspension 30 mL  30 mL Oral Q4H PRN [name], NP   30 mL at 05/08/22 0912   cloZAPine (CLOZARIL) tablet 200 mg  200 mg Oral QHS [name], [name], MD   200 mg at 05/11/22 2130   magnesium hydroxide (MILK OF MAGNESIA) suspension 30 mL  30 mL Oral Daily PRN [name], NP       nicotine polacrilex (NICORETTE) gum 2 mg  2 mg Oral PRN [name], [name], MD   2 mg at 04/23/22 2200   [START ON 05/30/2022] paliperidone (INVEGA SUSTENNA) injection 156 mg  156 mg Intramuscular Q28 days [name], [name], MD       ziprasidone (GEODON) injection 20 mg  20 mg Intramuscular Q12H PRN [name], MD       PTA Medications: Medications Prior to Admission  Medication Sig Dispense Refill Last Dose   ABILIFY MAINTENA 400 MG SRER injection Inject 400 mg into the muscle every 28 (twenty-eight) days.      ARIPiprazole (ABILIFY) 10 MG tablet Take 10 mg by mouth daily. (Patient not taking: Reported on 04/16/2022)      atomoxetine (STRATTERA) 40 MG capsule Take 40 mg by mouth daily. (Patient not taking: Reported on 04/16/2022)      atorvastatin (LIPITOR) 40 MG tablet Take 40 mg by mouth daily.      budesonide-formoterol (SYMBICORT) 160-4.5 MCG/ACT inhaler Inhale 2 puffs into the lungs.      cetirizine (ZYRTEC) 10 MG tablet Take 10 mg by mouth daily.      clozapine (CLOZARIL) 200 MG tablet Take 200 mg by mouth 2 (two) times daily. Take along with one 25 mg tablet  for total 225 mg twice daily      cloZAPine (CLOZARIL) 25 MG tablet Take 25 mg by mouth 2 (two) times daily. Take along with one 200 mg tablet for total 225 mg twice daily      Dexlansoprazole (DEXILANT) 30 MG capsule Take 30 mg by mouth daily. (Patient not taking: Reported on 04/16/2022)      hydrOXYzine (VISTARIL) 50 MG capsule Take 50 mg by mouth 3 (three) times daily as needed for itching. (Patient not taking: Reported on 04/16/2022)      Melatonin 5 MG TABS Take 5 mg by mouth at bedtime as needed (sleep).      metoprolol succinate (TOPROL-XL) 25 MG 24 hr tablet Take 12.5 mg by mouth daily.      Phosphatidyl Choline 65 MG TABS Take 1 tablet by mouth daily.  (Patient not taking: Reported on 04/16/2022)      valproic acid (DEPAKENE) 250 MG/5ML solution Take 750-1,000 mLs by mouth 2 (two) times daily. 1000 mg (20 mL) every morning and 750 mg (15 mL) daily at bedtime       Patient Stressors:    Patient Strengths:    Treatment Modalities: Medication Management, Group therapy, Case management,  1 to 1 session with clinician, Psychoeducation, Recreational therapy.   Physician Treatment Plan for Primary Diagnosis: Schizophrenia ([place])  Long Term Goal(s):     Short Term Goals: None, pt is a poor hx.  Medication Management: Evaluate patient's response, side effects, and tolerance of medication regimen.  Therapeutic Interventions: 1 to 1 sessions, Unit Group sessions and Medication administration.  Evaluation of Outcomes: Adequate for Discharge  Physician Treatment Plan for Secondary Diagnosis: Principal Problem:   Schizophrenia ([place]) Active Problems:   Psychosis ([place])  Long Term Goal(s):     Short Term Goals: None, pt is a poor hx.     Medication Management: Evaluate patient's response, side effects, and tolerance of medication regimen.  Therapeutic Interventions: 1 to 1 sessions, Unit Group sessions and Medication administration.  Evaluation of Outcomes: Adequate for  Discharge   RN Treatment Plan for Primary Diagnosis: Schizophrenia ([place]) Long Term Goal(s): Knowledge of disease and therapeutic regimen to maintain health will improve  Short Term Goals: Ability to remain free from injury will improve, Ability to verbalize frustration and anger appropriately will improve, Ability to demonstrate self-control, Ability to participate in decision making will improve, Ability to verbalize feelings will improve, Ability to identify and develop effective coping behaviors will improve, and Compliance with prescribed medications will improve  Medication Management: RN will administer medications as ordered by provider, will assess and evaluate patient's response and provide education to patient for prescribed medication. RN will report any adverse and/or side effects to prescribing provider.  Therapeutic Interventions: 1 on 1 counseling sessions, Psychoeducation, Medication administration, Evaluate responses to treatment, Monitor vital signs and CBGs as ordered, Perform/monitor CIWA, COWS, AIMS and Fall Risk screenings as ordered, Perform wound care treatments as ordered.  Evaluation of Outcomes: Progressing   LCSW Treatment Plan for Primary Diagnosis: Schizophrenia ([place]) Long Term Goal(s): Safe transition to appropriate next level of care at discharge, Engage patient in therapeutic group addressing interpersonal concerns.  Short Term Goals: Engage patient in aftercare planning with referrals and resources, Increase social support, Increase ability to appropriately verbalize feelings, Increase emotional regulation, Facilitate acceptance of mental health diagnosis and concerns, Identify triggers associated with mental health/substance abuse issues, and Increase skills for wellness and recovery  Therapeutic Interventions: Assess for all discharge needs, 1 to 1 time with Social worker, Explore available resources and support systems, Assess for adequacy in community support  network, Educate family and significant other(s) on suicide prevention, Complete Psychosocial Assessment, Interpersonal group therapy.  Evaluation of Outcomes: Progressing   Progress in Treatment: Attending groups: No. Participating in groups: No. Taking medication as prescribed: Yes. Toleration medication: Yes. Family/Significant other contact made: Yes, individual(s) contacted:  father, Jaquese Irving. Patient understands diagnosis: No. Discussing patient identified problems/goals with staff: Yes. Medical problems stabilized or resolved: Yes. Denies suicidal/homicidal ideation: Yes. Issues/concerns per patient self-inventory: No. Other: none.   New problem(s) identified: No, Describe:  none Update 04/27/2022: No changes at this time.  Update 05/02/2022:   No changes at this time. Update 05/07/22: No changes at this time.  Update 05/12/22: No changes at this time.   New Short Term/Long Term Goal(s): Patient to work towards elimination of symptoms of psychosis, medication management for mood stabilization; elimination of SI thoughts; development of comprehensive mental wellness plan. Update 04/27/2022: No changes at this time.  Update 05/02/2022:   No changes at this time. Update 05/07/22: No changes at this time. Update 05/12/22: No changes at this time.   Patient Goals:  No additional goals identified at this time. Patient to continue to work towards original goals identified in initial treatment team meeting. CSW will remain available  to patient should they voice additional treatment goals. Update 04/27/2022: No changes at this time.  Update 05/02/2022:   No changes at this time. Update 05/07/22: No changes at this time. Update 05/12/22: No changes at this time.   Discharge Plan or Barriers: No psychosocial barriers identified at this time, patient to return to place of residence when appropriate for discharge. Update 04/27/2022: No changes at this time.  Update 05/02/2022:   Treatment team has discovered  that the patient is not able to return to his group home and is in need of placement.  Meeting was held with treatment team and [facility] to assist patient in identifying appropriate housing. Update 05/07/22: No changes at this time. Update 05/12/22: No changes at this time.   Reason for Continuation of Hospitalization: Medication stabilization Other; describe psychosis    Estimated Length of Stay: 1-7 days Update 04/27/2022: No changes at this time. Update 05/02/2022:   TBD Update 05/07/22: No changes at this time. Update 05/12/22: No changes at this time.  Last 3 [place] Suicide Severity Risk Score: Flowsheet Row Admission (Current) from 04/16/2022 in [facility]PATIENT [facility] ED from 04/15/2022 in [facility] Emergency Department at [facility]  C-SSRS RISK CATEGORY No Risk No Risk       Last PHQ 2/9 Scores:     No data to display          Scribe for Treatment Team: Otelia Santee, LCSW 05/12/2022 11:18 AM

## 2022-05-12 NOTE — [facility] Group Notes (Signed)
[place] Group Notes:  (Nursing/MHT/Case Management/Adjunct)  Date:  05/12/2022  Time:  11:07 AM  Type of Therapy:   community meeting  Participation Level:  Did Not Attend    Antonieta Pert 05/12/2022, 11:07 AM

## 2022-05-12 NOTE — Progress Notes (Signed)
[facility] MD Progress Note  05/12/2022 12:31 PM Travis Sandoval  MRN:  [id number] Subjective: Follow-up 42 year old man with schizophrenia.  No new complaints.  Stays to himself in his room which he has done all along.  Takes his medicine.  Understands the current situation.  Paranoid if you get into conversation with him but not acting out on it. Principal Problem: Schizophrenia ([facility]) Diagnosis: Principal Problem:   Schizophrenia (H[facility]) Active Problems:   Psychosis (H[facility])  Total Time spent with patient: 20 minutes  Past Psychiatric History: Past history of schizophrenia  Past Medical History:  Past Medical History:  Diagnosis Date   ADHD    Bipolar 1 disorder (HCC)    Hepatitis C    Schizoaffective disorder ([facility])    History reviewed. No pertinent surgical history. Family History:  Family History  Family history unknown: Yes   Family Psychiatric  History: See previous Social History:  Social History   Substance and Sexual Activity  Alcohol Use Not Currently     Social History   Substance and Sexual Activity  Drug Use Not Currently    Social History   Socioeconomic History   Marital status: Unknown    Spouse name: Not on file   Number of children: Not on file   Years of education: Not on file   Highest education level: Not on file  Occupational History   Not on file  Tobacco Use   Smoking status: Every Day    Packs/day: 0.25    Years: 15.00    Total pack years: 3.75    Types: Cigarettes   Smokeless tobacco: Not on file  Vaping Use   Vaping Use: Not on file  Substance and Sexual Activity   Alcohol use: Not Currently   Drug use: Not Currently   Sexual activity: Not Currently  Other Topics Concern   Not on file  Social History Narrative   Not on file   Social Determinants of Health   Financial Resource Strain: Not on file  Food Insecurity: No Food Insecurity (04/16/2022)   Hunger Vital Sign    Worried About Running Out of Food in the Last Year: Never true     Ran Out of Food in the Last Year: Never true  Transportation Needs: No Transportation Needs (04/16/2022)   PRAPARE - Administrator, Civil Service (Medical): No    Lack of Transportation (Non-Medical): No  Physical Activity: Not on file  Stress: Not on file  Social Connections: Not on file   Additional Social History:                         Sleep: Fair  Appetite:  Fair  Current Medications: Current Facility-Administered Medications  Medication Dose Route Frequency Provider Last Rate Last Admin   acetaminophen (TYLENOL) tablet 650 mg  650 mg Oral Q6H PRN Vanetta Mulders, NP       alum & mag hydroxide-simeth (MAALOX/MYLANTA) 200-200-20 MG/5ML suspension 30 mL  30 mL Oral Q4H PRN Gabriel Cirri F, NP   30 mL at 05/08/22 0912   cloZAPine (CLOZARIL) tablet 200 mg  200 mg Oral QHS Sieara Bremer T, MD   200 mg at 05/11/22 2130   magnesium hydroxide (MILK OF MAGNESIA) suspension 30 mL  30 mL Oral Daily PRN Gabriel Cirri F, NP       nicotine polacrilex (NICORETTE) gum 2 mg  2 mg Oral PRN Marcques Wrightsman, [name], MD   2 mg at 04/23/22  2200   [START ON 05/30/2022] paliperidone (INVEGA SUSTENNA) injection 156 mg  156 mg Intramuscular Q28 days Jamar Casagrande T, MD       ziprasidone (GEODON) injection 20 mg  20 mg Intramuscular Q12H PRN [name], MD        Lab Results: No results found for this or any previous visit (from the past 38 hour(s)).  Blood Alcohol level:  Lab Results  Component Value Date   ETH <10 15/17/6160    Metabolic Disorder Labs: Lab Results  Component Value Date   HGBA1C 5.5 04/24/2022   MPG 111 04/24/2022   No results found for: "PROLACTIN" Lab Results  Component Value Date   CHOL 220 (H) 04/24/2022   TRIG 700 (H) 04/24/2022   HDL 35 (L) 04/24/2022   CHOLHDL 6.3 04/24/2022   VLDL UNABLE TO CALCULATE IF TRIGLYCERIDE OVER 400 mg/dL 04/24/2022   LDLCALC UNABLE TO CALCULATE IF TRIGLYCERIDE OVER 400 mg/dL 04/24/2022    Physical  Findings: AIMS: Facial and Oral Movements Muscles of Facial Expression: None, normal Lips and Perioral Area: None, normal Jaw: None, normal Tongue: None, normal,Extremity Movements Upper (arms, wrists, hands, fingers): None, normal Lower (legs, knees, ankles, toes): None, normal, Trunk Movements Neck, shoulders, hips: None, normal, Overall Severity Severity of abnormal movements (highest score from questions above): None, normal Incapacitation due to abnormal movements: None, normal Patient's awareness of abnormal movements (rate only patient's report): No Awareness, Dental Status Current problems with teeth and/or dentures?: No Does patient usually wear dentures?: No  CIWA:    COWS:     Musculoskeletal: Strength & Muscle Tone: within normal limits Gait & Station: normal Patient leans: N/A  Psychiatric Specialty Exam:  Presentation  General Appearance:  Disheveled  Eye Contact: Minimal  Speech: Pressured  Speech Volume: Normal  Handedness: Right   Mood and Affect  Mood: Euphoric; Irritable  Affect: Inappropriate   Thought Process  Thought Processes: Disorganized  Descriptions of Associations:Loose  Orientation:Partial  Thought Content:Illogical; Scattered  History of Schizophrenia/Schizoaffective disorder:No data recorded Duration of Psychotic Symptoms:No data recorded Hallucinations:No data recorded Ideas of Reference:Delusions; Paranoia  Suicidal Thoughts:No data recorded Homicidal Thoughts:No data recorded  Sensorium  Memory: Immediate Poor; Recent Poor; Remote Poor  Judgment: Poor  Insight: Poor   Executive Functions  Concentration: Fair  Attention Span: Fair  Recall: Poor  Fund of Knowledge: Poor  Language: Poor   Psychomotor Activity  Psychomotor Activity:No data recorded  Assets  Assets: Desire for Improvement; Social Support   Sleep  Sleep:No data recorded   Physical Exam: Physical Exam Vitals and  nursing note reviewed.  Constitutional:      Appearance: Normal appearance.  HENT:     Head: Normocephalic and atraumatic.     Mouth/Throat:     Pharynx: Oropharynx is clear.  Eyes:     Pupils: Pupils are equal, round, and reactive to light.  Cardiovascular:     Rate and Rhythm: Normal rate and regular rhythm.  Pulmonary:     Effort: Pulmonary effort is normal.     Breath sounds: Normal breath sounds.  Abdominal:     General: Abdomen is flat.     Palpations: Abdomen is soft.  Musculoskeletal:        General: Normal range of motion.  Skin:    General: Skin is warm and dry.  Neurological:     General: No focal deficit present.     Mental Status: He is alert. Mental status is at baseline.  Psychiatric:  Attention and Perception: Attention normal.        Mood and Affect: Mood normal. Affect is blunt.        Speech: Speech normal.        Behavior: Behavior is withdrawn.        Thought Content: Thought content normal.        Cognition and Memory: Cognition normal.    Review of Systems  Constitutional: Negative.   HENT: Negative.    Eyes: Negative.   Respiratory: Negative.    Cardiovascular: Negative.   Gastrointestinal: Negative.   Musculoskeletal: Negative.   Skin: Negative.   Neurological: Negative.   Psychiatric/Behavioral: Negative.     Blood pressure 116/76, pulse (!) 108, temperature 97.9 F (36.6 C), temperature source Oral, resp. rate 18, height 5\' 9"  (1.753 m), weight 96.4 kg, SpO2 97 %. Body mass index is 31.38 kg/m.   Treatment Plan Summary: Medication management and Plan no change to medication.  Ordered a clozapine level which will be back in probably in a few days.  Supportive encouragement provided to the patient.  Alethia Berthold, MD 05/12/2022, 12:31 PM

## 2022-05-12 NOTE — Progress Notes (Addendum)
Pt denies SI/HI/AVH and verbally agrees to approach staff if these become apparent or before harming themselves/others. Rates depression 0/10. Rates anxiety 0/10. Rates pain 0/10. Pt has been in his room for most of the day. Pt will sit up on bed and stare from time to time. Pt does come out for meals. Pt seen talking to himself in his room. Scheduled medications administered to pt, per MD orders. RN provided support and encouragement to pt. Q15 min safety checks implemented and continued. Pt safe on the unit. RN will continue to monitor and intervene as needed.  05/12/22 0800  Psych Admission Type (Psych Patients Only)  Admission Status Involuntary  Psychosocial Assessment  Patient Complaints None  Eye Contact None  Facial Expression Flat  Affect Appropriate to circumstance  Speech Logical/coherent  Interaction Isolative;Minimal  Motor Activity Slow  Appearance/Hygiene Improved  Behavior Characteristics Cooperative;Appropriate to situation;Calm  Mood Pleasant  Aggressive Behavior  Effect No apparent injury  Thought Process  Coherency WDL  Content WDL  Delusions None reported or observed  Perception WDL  Hallucination None reported or observed  Judgment Limited  Confusion None  Danger to Self  Current suicidal ideation? Denies  Danger to Others  Danger to Others None reported or observed

## 2022-05-13 DIAGNOSIS — F203 Undifferentiated schizophrenia: Secondary | ICD-10-CM | POA: Diagnosis not present

## 2022-05-13 LAB — CLOZAPINE (CLOZARIL)
Clozapine Lvl: 302 ng/mL — ABNORMAL LOW (ref 350–600)
NorClozapine: 129 ng/mL
Total(Cloz+Norcloz): 431 ng/mL

## 2022-05-13 MED ORDER — CLOZAPINE 25 MG PO TABS
250.0000 mg | ORAL_TABLET | Freq: Every day | ORAL | Status: DC
  Administered 2022-05-13 – 2022-06-22 (×40): 250 mg via ORAL
  Filled 2022-05-13 (×41): qty 2

## 2022-05-13 NOTE — Group Note (Signed)
[facility] LCSW Group Therapy Note    Group Date: 05/13/2022 Start Time: 1300 End Time: 1400  Type of Therapy and Topic:  Group Therapy:  Overcoming Obstacles  Participation Level:  [facility] PARTICIPATION LEVEL: Did Not Attend  Mood:  Description of Group:   In this group patients will be encouraged to explore what they see as obstacles to their own wellness and recovery. They will be guided to discuss their thoughts, feelings, and behaviors related to these obstacles. The group will process together ways to cope with barriers, with attention given to specific choices patients can make. Each patient will be challenged to identify changes they are motivated to make in order to overcome their obstacles. This group will be process-oriented, with patients participating in exploration of their own experiences as well as giving and receiving support and challenge from other group members.  Therapeutic Goals: 1. Patient will identify personal and current obstacles as they relate to admission. 2. Patient will identify barriers that currently interfere with their wellness or overcoming obstacles.  3. Patient will identify feelings, thought process and behaviors related to these barriers. 4. Patient will identify two changes they are willing to make to overcome these obstacles:    Summary of Patient Progress   X   Therapeutic Modalities:   Cognitive Behavioral Therapy Solution Focused Therapy Motivational Interviewing Relapse Prevention Therapy   Harleigh Civello J Bristyn Kulesza, LCSW 

## 2022-05-13 NOTE — Progress Notes (Signed)
Patient alert and oriented x 4, affect is flat thoughts are organized and coherent . Patient denies SI/HI/AVH. Patient is not interacting with peers and  forwards very little to staff. Patient is complaint with medication regimen.15 minutes safety checks maintained will continue to monitor.

## 2022-05-13 NOTE — Progress Notes (Signed)
[facility] MD Progress Note  05/13/2022 4:49 PM Travis Sandoval  MRN:  [id number] Subjective: Follow-up 42 year old man with schizophrenia.  Patient no trouble today.  During conversation it is obvious that he is still delusional believing he is on a Scientific laboratory technician for [employer]. Principal Problem: Schizophrenia (HCC) Diagnosis: Principal Problem:   Schizophrenia (HCC) Active Problems:   Psychosis (HCC)  Total Time spent with patient: 20 minutes  Past Psychiatric History: History of schizophrenia with lengthy hospitalization  Past Medical History:  Past Medical History:  Diagnosis Date   ADHD    Bipolar 1 disorder (HCC)    Hepatitis C    Schizoaffective disorder (HCC)    History reviewed. No pertinent surgical history. Family History:  Family History  Family history unknown: Yes   Family Psychiatric  History: See previous Social History:  Social History   Substance and Sexual Activity  Alcohol Use Not Currently     Social History   Substance and Sexual Activity  Drug Use Not Currently    Social History   Socioeconomic History   Marital status: Unknown    Spouse name: Not on file   Number of children: Not on file   Years of education: Not on file   Highest education level: Not on file  Occupational History   Not on file  Tobacco Use   Smoking status: Every Day    Packs/day: 0.25    Years: 15.00    Total pack years: 3.75    Types: Cigarettes   Smokeless tobacco: Not on file  Vaping Use   Vaping Use: Not on file  Substance and Sexual Activity   Alcohol use: Not Currently   Drug use: Not Currently   Sexual activity: Not Currently  Other Topics Concern   Not on file  Social History Narrative   Not on file   Social Determinants of Health   Financial Resource Strain: Not on file  Food Insecurity: No Food Insecurity (04/16/2022)   Hunger Vital Sign    Worried About Running Out of Food in the Last Year: Never true    Ran Out of Food in the Last Year: Never  true  Transportation Needs: No Transportation Needs (04/16/2022)   PRAPARE - Administrator, Civil Service (Medical): No    Lack of Transportation (Non-Medical): No  Physical Activity: Not on file  Stress: Not on file  Social Connections: Not on file   Additional Social History:                         Sleep: Fair  Appetite:  Fair  Current Medications: Current Facility-Administered Medications  Medication Dose Route Frequency Provider Last Rate Last Admin   acetaminophen (TYLENOL) tablet 650 mg  650 mg Oral Q6H PRN Vanetta Mulders, NP       alum & mag hydroxide-simeth (MAALOX/MYLANTA) 200-200-20 MG/5ML suspension 30 mL  30 mL Oral Q4H PRN Gabriel Cirri F, NP   30 mL at 05/08/22 0912   cloZAPine (CLOZARIL) tablet 200 mg  200 mg Oral QHS Aidel Davisson T, MD   200 mg at 05/12/22 2114   magnesium hydroxide (MILK OF MAGNESIA) suspension 30 mL  30 mL Oral Daily PRN Gabriel Cirri F, NP       nicotine polacrilex (NICORETTE) gum 2 mg  2 mg Oral PRN Abrish Erny T, MD   2 mg at 04/23/22 2200   [START ON 05/30/2022] paliperidone (INVEGA SUSTENNA) injection 156 mg  156 mg Intramuscular Q28 days Tysheka Fanguy, [name], MD       ziprasidone (GEODON) injection 20 mg  20 mg Intramuscular Q12H PRN [name], MD        Lab Results:  Results for orders placed or performed during the hospital encounter of 04/16/22 (from the past 48 hour(s))  Clozapine (clozaril)     Status: Abnormal   Collection Time: 05/12/22 10:05 AM  Result Value Ref Range   Clozapine Lvl 302 (L) 350 - 600 ng/mL    Comment: (NOTE) This test was developed and its performance characteristics determined by [facility]. It has not been cleared or approved by the Food and Drug Administration.    NorClozapine 129 Not Estab. ng/mL    Comment: (NOTE) This test was developed and its performance characteristics determined by [facility]. It has not been cleared or approved by the Food and Drug Administration.     Total(Cloz+Norcloz) 431 ng/mL    Comment: (NOTE) Plasma concentrations of clozapine plus norclozapine (combined total) greater than 450 ng/mL have been associated with therapeutic effect.                                Detection Limit = 20 Performed At: [facility] [place], [place] [id number] Rush Farmer MD YW:[id number]     Blood Alcohol level:  Lab Results  Component Value Date   [facility] <10 85/46/2703    Metabolic Disorder Labs: Lab Results  Component Value Date   HGBA1C 5.5 04/24/2022   MPG 111 04/24/2022   No results found for: "PROLACTIN" Lab Results  Component Value Date   CHOL 220 (H) 04/24/2022   TRIG 700 (H) 04/24/2022   HDL 35 (L) 04/24/2022   CHOLHDL 6.3 04/24/2022   VLDL UNABLE TO CALCULATE IF TRIGLYCERIDE OVER 400 mg/dL 04/24/2022   LDLCALC UNABLE TO CALCULATE IF TRIGLYCERIDE OVER 400 mg/dL 04/24/2022    Physical Findings: AIMS: Facial and Oral Movements Muscles of Facial Expression: None, normal Lips and Perioral Area: None, normal Jaw: None, normal Tongue: None, normal,Extremity Movements Upper (arms, wrists, hands, fingers): None, normal Lower (legs, knees, ankles, toes): None, normal, Trunk Movements Neck, shoulders, hips: None, normal, Overall Severity Severity of abnormal movements (highest score from questions above): None, normal Incapacitation due to abnormal movements: None, normal Patient's awareness of abnormal movements (rate only patient's report): No Awareness, Dental Status Current problems with teeth and/or dentures?: No Does patient usually wear dentures?: No  CIWA:    COWS:     Musculoskeletal: Strength & Muscle Tone: within normal limits Gait & Station: normal Patient leans: N/A  Psychiatric Specialty Exam:  Presentation  General Appearance:  Disheveled  Eye Contact: Minimal  Speech: Pressured  Speech Volume: Normal  Handedness: Right   Mood and Affect  Mood: Euphoric;  Irritable  Affect: Inappropriate   Thought Process  Thought Processes: Disorganized  Descriptions of Associations:Loose  Orientation:Partial  Thought Content:Illogical; Scattered  History of Schizophrenia/Schizoaffective disorder:No data recorded Duration of Psychotic Symptoms:No data recorded Hallucinations:No data recorded Ideas of Reference:Delusions; Paranoia  Suicidal Thoughts:No data recorded Homicidal Thoughts:No data recorded  Sensorium  Memory: Immediate Poor; Recent Poor; Remote Poor  Judgment: Poor  Insight: Poor   Executive Functions  Concentration: Fair  Attention Span: Fair  Recall: Poor  Fund of Knowledge: Poor  Language: Poor   Psychomotor Activity  Psychomotor Activity:No data recorded  Assets  Assets: Desire for Improvement; Social Support   Sleep  Sleep:No data recorded   Physical Exam: Physical Exam Vitals and nursing note reviewed.  Constitutional:      Appearance: Normal appearance.  HENT:     Head: Normocephalic and atraumatic.     Mouth/Throat:     Pharynx: Oropharynx is clear.  Eyes:     Pupils: Pupils are equal, round, and reactive to light.  Cardiovascular:     Rate and Rhythm: Normal rate and regular rhythm.  Pulmonary:     Effort: Pulmonary effort is normal.     Breath sounds: Normal breath sounds.  Abdominal:     General: Abdomen is flat.     Palpations: Abdomen is soft.  Musculoskeletal:        General: Normal range of motion.  Skin:    General: Skin is warm and dry.  Neurological:     General: No focal deficit present.     Mental Status: He is alert. Mental status is at baseline.  Psychiatric:        Attention and Perception: He is inattentive.        Mood and Affect: Mood normal. Affect is blunt.        Speech: Speech is delayed.        Behavior: Behavior is slowed.        Thought Content: Thought content is delusional.    ROS Blood pressure 130/82, pulse (!) 103, temperature 97.9 F  (36.6 C), temperature source Oral, resp. rate 18, height 5\' 9"  (1.753 m), weight 96.4 kg, SpO2 98 %. Body mass index is 31.38 kg/m.   Treatment Plan Summary: Plan increase Clozapine to 250 mg.  Clozapine level was lower than expected.  Continue looking for discharge planning  Alethia Berthold, MD 05/13/2022, 4:49 PM

## 2022-05-13 NOTE — Plan of Care (Signed)
  Problem: Education: Goal: Emotional status will improve Outcome: Progressing   Problem: Activity: Goal: Sleeping patterns will improve Outcome: Progressing   Problem: Coping: Goal: Ability to demonstrate self-control will improve Outcome: Progressing   Problem: Activity: Goal: Interest or engagement in activities will improve Outcome: Not Progressing

## 2022-05-13 NOTE — Progress Notes (Signed)
Pt denies SI/HI/AVH and verbally agrees to approach staff if these become apparent or before harming themselves/others. Rates depression 0/10. Rates anxiety 0/10. Rates pain 0/10. Pt has been isolating in his room all day. Pt comes out for meals but has minimal interaction. Pt seen sitting up in his bed in the dark. Pt will talk to self at times. Scheduled medications administered to pt, per MD orders. RN provided support and encouragement to pt. Q15 min safety checks implemented and continued. Pt safe on the unit. RN will continue to monitor and intervene as needed.  05/13/22 1100  Psych Admission Type (Psych Patients Only)  Admission Status Involuntary  Psychosocial Assessment  Patient Complaints None  Eye Contact Brief  Facial Expression Flat  Affect Flat  Speech Logical/coherent  Interaction Forwards little;Isolative;Minimal  Motor Activity Slow  Appearance/Hygiene Improved  Behavior Characteristics Cooperative;Appropriate to situation;Calm  Mood Pleasant  Aggressive Behavior  Effect No apparent injury  Thought Process  Coherency WDL  Content WDL  Delusions None reported or observed  Perception Hallucinations  Hallucination Auditory  Judgment Impaired  Confusion None  Danger to Self  Current suicidal ideation? Denies  Danger to Others  Danger to Others None reported or observed

## 2022-05-13 NOTE — Progress Notes (Signed)
Recreation Therapy Notes    Date: 05/13/2022  Time: 10:30 am  Location: Craft room     Behavioral response: N/A   Intervention Topic: Time Management   Discussion/Intervention: Patient refused to attend group.   Clinical Observations/Feedback:  Patient refused to attend group.    Tarena Gockley LRT/CTRS        Travis Sandoval 05/13/2022 1:39 PM 

## 2022-05-14 DIAGNOSIS — F203 Undifferentiated schizophrenia: Secondary | ICD-10-CM | POA: Diagnosis not present

## 2022-05-14 NOTE — Progress Notes (Signed)
[facility] MD Progress Note  05/14/2022 5:47 PM Travis Sandoval  MRN:  [id number] Subjective: Follow-up 42 year old man with schizophrenia.  Patient continues to be delusional but not agitated and not causing any active problems.  Mostly stays to himself.  Taking his medicine. Principal Problem: Schizophrenia ([place]) Diagnosis: Principal Problem:   Schizophrenia ([place]) Active Problems:   Psychosis ([place])  Total Time spent with patient: 20 minutes  Past Psychiatric History: Past history of schizophrenia recent long hospitalization and then difficulty adjusting to a group home  Past Medical History:  Past Medical History:  Diagnosis Date   ADHD    Bipolar 1 disorder ([place])    Hepatitis C    Schizoaffective disorder ([place])    History reviewed. No pertinent surgical history. Family History:  Family History  Family history unknown: Yes   Family Psychiatric  History: See previous Social History:  Social History   Substance and Sexual Activity  Alcohol Use Not Currently     Social History   Substance and Sexual Activity  Drug Use Not Currently    Social History   Socioeconomic History   Marital status: Unknown    Spouse name: Not on file   Number of children: Not on file   Years of education: Not on file   Highest education level: Not on file  Occupational History   Not on file  Tobacco Use   Smoking status: Every Day    Packs/day: 0.25    Years: 15.00    Total pack years: 3.75    Types: Cigarettes   Smokeless tobacco: Not on file  Vaping Use   Vaping Use: Not on file  Substance and Sexual Activity   Alcohol use: Not Currently   Drug use: Not Currently   Sexual activity: Not Currently  Other Topics Concern   Not on file  Social History Narrative   Not on file   Social Determinants of Health   Financial Resource Strain: Not on file  Food Insecurity: No Food Insecurity (04/16/2022)   Hunger Vital Sign    Worried About Running Out of Food in the Last Year: Never true     Ran Out of Food in the Last Year: Never true  Transportation Needs: No Transportation Needs (04/16/2022)   PRAPARE - Hydrologist (Medical): No    Lack of Transportation (Non-Medical): No  Physical Activity: Not on file  Stress: Not on file  Social Connections: Not on file   Additional Social History:                         Sleep: Fair  Appetite:  Fair  Current Medications: Current Facility-Administered Medications  Medication Dose Route Frequency Provider Last Rate Last Admin   acetaminophen (TYLENOL) tablet 650 mg  650 mg Oral Q6H PRN [name], NP       alum & mag hydroxide-simeth (MAALOX/MYLANTA) 200-200-20 MG/5ML suspension 30 mL  30 mL Oral Q4H PRN [name], NP   30 mL at 05/08/22 0912   cloZAPine (CLOZARIL) tablet 250 mg  250 mg Oral QHS Naya Ilagan T, MD   250 mg at 05/13/22 2103   magnesium hydroxide (MILK OF MAGNESIA) suspension 30 mL  30 mL Oral Daily PRN [name], NP       nicotine polacrilex (NICORETTE) gum 2 mg  2 mg Oral PRN Tuan Tippin, [name], MD   2 mg at 04/23/22 2200   [START ON 05/30/2022]  paliperidone (INVEGA SUSTENNA) injection 156 mg  156 mg Intramuscular Q28 days Miquel Lamson T, MD       ziprasidone (GEODON) injection 20 mg  20 mg Intramuscular Q12H PRN Reggie Pile, MD        Lab Results: No results found for this or any previous visit (from the past 48 hour(s)).  Blood Alcohol level:  Lab Results  Component Value Date   ETH <10 04/15/2022    Metabolic Disorder Labs: Lab Results  Component Value Date   HGBA1C 5.5 04/24/2022   MPG 111 04/24/2022   No results found for: "PROLACTIN" Lab Results  Component Value Date   CHOL 220 (H) 04/24/2022   TRIG 700 (H) 04/24/2022   HDL 35 (L) 04/24/2022   CHOLHDL 6.3 04/24/2022   VLDL UNABLE TO CALCULATE IF TRIGLYCERIDE OVER 400 mg/dL 16/01/9603   LDLCALC UNABLE TO CALCULATE IF TRIGLYCERIDE OVER 400 mg/dL 54/12/8117    Physical  Findings: AIMS: Facial and Oral Movements Muscles of Facial Expression: None, normal Lips and Perioral Area: None, normal Jaw: None, normal Tongue: None, normal,Extremity Movements Upper (arms, wrists, hands, fingers): None, normal Lower (legs, knees, ankles, toes): None, normal, Trunk Movements Neck, shoulders, hips: None, normal, Overall Severity Severity of abnormal movements (highest score from questions above): None, normal Incapacitation due to abnormal movements: None, normal Patient's awareness of abnormal movements (rate only patient's report): No Awareness, Dental Status Current problems with teeth and/or dentures?: No Does patient usually wear dentures?: No  CIWA:    COWS:     Musculoskeletal: Strength & Muscle Tone: within normal limits Gait & Station: normal Patient leans: N/A  Psychiatric Specialty Exam:  Presentation  General Appearance:  Disheveled  Eye Contact: Minimal  Speech: Pressured  Speech Volume: Normal  Handedness: Right   Mood and Affect  Mood: Euphoric; Irritable  Affect: Inappropriate   Thought Process  Thought Processes: Disorganized  Descriptions of Associations:Loose  Orientation:Partial  Thought Content:Illogical; Scattered  History of Schizophrenia/Schizoaffective disorder:No data recorded Duration of Psychotic Symptoms:No data recorded Hallucinations:No data recorded Ideas of Reference:Delusions; Paranoia  Suicidal Thoughts:No data recorded Homicidal Thoughts:No data recorded  Sensorium  Memory: Immediate Poor; Recent Poor; Remote Poor  Judgment: Poor  Insight: Poor   Executive Functions  Concentration: Fair  Attention Span: Fair  Recall: Poor  Fund of Knowledge: Poor  Language: Poor   Psychomotor Activity  Psychomotor Activity:No data recorded  Assets  Assets: Desire for Improvement; Social Support   Sleep  Sleep:No data recorded   Physical Exam: Physical Exam Vitals and  nursing note reviewed.  Constitutional:      Appearance: Normal appearance.  HENT:     Head: Normocephalic and atraumatic.     Mouth/Throat:     Pharynx: Oropharynx is clear.  Eyes:     Pupils: Pupils are equal, round, and reactive to light.  Cardiovascular:     Rate and Rhythm: Normal rate and regular rhythm.  Pulmonary:     Effort: Pulmonary effort is normal.     Breath sounds: Normal breath sounds.  Abdominal:     General: Abdomen is flat.     Palpations: Abdomen is soft.  Musculoskeletal:        General: Normal range of motion.  Skin:    General: Skin is warm and dry.  Neurological:     General: No focal deficit present.     Mental Status: He is alert. Mental status is at baseline.  Psychiatric:        Attention and Perception:  Attention normal.        Mood and Affect: Mood normal.        Behavior: Behavior is withdrawn. Behavior is cooperative.        Thought Content: Thought content is delusional.    Review of Systems  Constitutional: Negative.   HENT: Negative.    Eyes: Negative.   Respiratory: Negative.    Cardiovascular: Negative.   Gastrointestinal: Negative.   Musculoskeletal: Negative.   Skin: Negative.   Neurological: Negative.   Psychiatric/Behavioral: Negative.     Blood pressure (!) 139/90, pulse (!) 112, temperature 98 F (36.7 C), temperature source Oral, resp. rate 18, height 5\' 9"  (1.753 m), weight 96.4 kg, SpO2 99 %. Body mass index is 31.38 kg/m.   Treatment Plan Summary: Medication management and Plan increase Clozapine last night after reviewing the blood level.  No change to medicine for today.  We are still working with the agency trying to find funding to lead to a discharge plan.  Alethia Berthold, MD 05/14/2022, 5:47 PM

## 2022-05-14 NOTE — Plan of Care (Signed)
D: Patient alert and oriented. Patient denies pain. Patient denies anxiety and depression. Patient denies SI/HI/AVH. Patient remains isolative to room during shift with exception to coming out for meals. Patient did not have any scheduled medications during this shift.   A: Support and encouragement provided to patient.  Q15 minute safety checks maintained.   R: Patient compliant with treatment plan.  Patient remains safe on the unit at this time. Problem: Education: Goal: Knowledge of [facility] Education information/materials will improve Outcome: Progressing Goal: Verbalization of understanding the information provided will improve Outcome: Progressing   Problem: Health Behavior/Discharge Planning: Goal: Compliance with treatment plan for underlying cause of condition will improve Outcome: Progressing   Problem: Physical Regulation: Goal: Ability to maintain clinical measurements within normal limits will improve Outcome: Progressing   Problem: Education: Goal: Knowledge of the prescribed therapeutic regimen will improve Outcome: Progressing

## 2022-05-14 NOTE — Group Note (Signed)
[facility] LCSW Group Therapy Note   Group Date: 05/14/2022 Start Time: 1300 End Time: 1400  Type of Therapy/Topic:  Group Therapy:  Feelings about Diagnosis  Participation Level:  Did Not Attend    Description of Group:    This group will allow patients to explore their thoughts and feelings about diagnoses they have received. Patients will be guided to explore their level of understanding and acceptance of these diagnoses. Facilitator will encourage patients to process their thoughts and feelings about the reactions of others to their diagnosis, and will guide patients in identifying ways to discuss their diagnosis with significant others in their lives. This group will be process-oriented, with patients participating in exploration of their own experiences as well as giving and receiving support and challenge from other group members.   Therapeutic Goals: 1. Patient will demonstrate understanding of diagnosis as evidence by identifying two or more symptoms of the disorder:  2. Patient will be able to express two feelings regarding the diagnosis 3. Patient will demonstrate ability to communicate their needs through discussion and/or role plays  Summary of Patient Progress: X   Therapeutic Modalities:   Cognitive Behavioral Therapy Brief Therapy Feelings Identification    [name] Antanisha Mohs, LCSW 

## 2022-05-14 NOTE — Plan of Care (Signed)
Pt is calm and cooperative, denies SI HI AVH, hoping to go home soon.  Compliant with medications.  Isolative at times. Pt says he has a place to stay when he leaves.  Continued monitoring q 15 minutes for safety.

## 2022-05-14 NOTE — Progress Notes (Signed)
Recreation Therapy Notes    Date: 05/14/2022  Time: 9:50 am  Location: Courtyard     Behavioral response: N/A   Intervention Topic: Leisure   Discussion/Intervention: Patient refused to attend group.   Clinical Observations/Feedback:  Patient refused to attend group.    Coty Student LRT/CTRS        Monteen Toops 05/14/2022 12:35 PM

## 2022-05-15 DIAGNOSIS — F203 Undifferentiated schizophrenia: Secondary | ICD-10-CM | POA: Diagnosis not present

## 2022-05-15 LAB — CBC WITH DIFFERENTIAL/PLATELET
Abs Immature Granulocytes: 0.04 10*3/uL (ref 0.00–0.07)
Basophils Absolute: 0 10*3/uL (ref 0.0–0.1)
Basophils Relative: 1 %
Eosinophils Absolute: 0.1 10*3/uL (ref 0.0–0.5)
Eosinophils Relative: 1 %
HCT: 42.2 % (ref 39.0–52.0)
Hemoglobin: 14.5 g/dL (ref 13.0–17.0)
Immature Granulocytes: 1 %
Lymphocytes Relative: 46 %
Lymphs Abs: 3.2 10*3/uL (ref 0.7–4.0)
MCH: 31.5 pg (ref 26.0–34.0)
MCHC: 34.4 g/dL (ref 30.0–36.0)
MCV: 91.7 fL (ref 80.0–100.0)
Monocytes Absolute: 0.5 10*3/uL (ref 0.1–1.0)
Monocytes Relative: 7 %
Neutro Abs: 3 10*3/uL (ref 1.7–7.7)
Neutrophils Relative %: 44 %
Platelets: 267 10*3/uL (ref 150–400)
RBC: 4.6 MIL/uL (ref 4.22–5.81)
RDW: 12.3 % (ref 11.5–15.5)
WBC: 6.9 10*3/uL (ref 4.0–10.5)
nRBC: 0 % (ref 0.0–0.2)

## 2022-05-15 NOTE — Group Note (Signed)
[facility] LCSW Group Therapy Note   Group Date: 05/15/2022 Start Time: 1300 End Time: 1400   Type of Therapy/Topic:  Group Therapy:  Emotion Regulation  Participation Level:  Did Not Attend   Mood:  Description of Group:    The purpose of this group is to assist patients in learning to regulate negative emotions and experience positive emotions. Patients will be guided to discuss ways in which they have been vulnerable to their negative emotions. These vulnerabilities will be juxtaposed with experiences of positive emotions or situations, and patients challenged to use positive emotions to combat negative ones. Special emphasis will be placed on coping with negative emotions in conflict situations, and patients will process healthy conflict resolution skills.  Therapeutic Goals: Patient will identify two positive emotions or experiences to reflect on in order to balance out negative emotions:  Patient will label two or more emotions that they find the most difficult to experience:  Patient will be able to demonstrate positive conflict resolution skills through discussion or role plays:   Summary of Patient Progress: Patient did not attend group despite encouraged participation.   Therapeutic Modalities:   Cognitive Behavioral Therapy Feelings Identification Dialectical Behavioral Therapy   Devarion Mcclanahan W Peyten Punches, LCSWA 

## 2022-05-15 NOTE — Progress Notes (Signed)
[facility] MD Progress Note  05/15/2022 2:23 PM Travis Sandoval  MRN:  [id number] Subjective: Patient seen and chart reviewed.  Patient sleeping in his room but very easy to arouse.  Has no new complaints.  Has not shown any dangerous behavior or aggression.  When questioned about it still has delusions but is not acting on them.  Denies having hallucinations.  Denies any complaints from medicine. Principal Problem: Schizophrenia (HCC) Diagnosis: Principal Problem:   Schizophrenia (HCC) Active Problems:   Psychosis (HCC)  Total Time spent with patient: 30 minutes  Past Psychiatric History: History of schizophrenia with previous hospitalizations  Past Medical History:  Past Medical History:  Diagnosis Date   ADHD    Bipolar 1 disorder (HCC)    Hepatitis C    Schizoaffective disorder (HCC)    History reviewed. No pertinent surgical history. Family History:  Family History  Family history unknown: Yes   Family Psychiatric  History: See previous Social History:  Social History   Substance and Sexual Activity  Alcohol Use Not Currently     Social History   Substance and Sexual Activity  Drug Use Not Currently    Social History   Socioeconomic History   Marital status: Unknown    Spouse name: Not on file   Number of children: Not on file   Years of education: Not on file   Highest education level: Not on file  Occupational History   Not on file  Tobacco Use   Smoking status: Every Day    Packs/day: 0.25    Years: 15.00    Total pack years: 3.75    Types: Cigarettes   Smokeless tobacco: Not on file  Vaping Use   Vaping Use: Not on file  Substance and Sexual Activity   Alcohol use: Not Currently   Drug use: Not Currently   Sexual activity: Not Currently  Other Topics Concern   Not on file  Social History Narrative   Not on file   Social Determinants of Health   Financial Resource Strain: Not on file  Food Insecurity: No Food Insecurity (04/16/2022)   Hunger Vital  Sign    Worried About Running Out of Food in the Last Year: Never true    Ran Out of Food in the Last Year: Never true  Transportation Needs: No Transportation Needs (04/16/2022)   PRAPARE - Administrator, Civil Service (Medical): No    Lack of Transportation (Non-Medical): No  Physical Activity: Not on file  Stress: Not on file  Social Connections: Not on file   Additional Social History:                         Sleep: Fair  Appetite:  Fair  Current Medications: Current Facility-Administered Medications  Medication Dose Route Frequency Provider Last Rate Last Admin   acetaminophen (TYLENOL) tablet 650 mg  650 mg Oral Q6H PRN Vanetta Mulders, NP       alum & mag hydroxide-simeth (MAALOX/MYLANTA) 200-200-20 MG/5ML suspension 30 mL  30 mL Oral Q4H PRN Gabriel Cirri F, NP   30 mL at 05/14/22 2217   cloZAPine (CLOZARIL) tablet 250 mg  250 mg Oral QHS Gearl Baratta T, MD   250 mg at 05/14/22 2207   magnesium hydroxide (MILK OF MAGNESIA) suspension 30 mL  30 mL Oral Daily PRN Gabriel Cirri F, NP       nicotine polacrilex (NICORETTE) gum 2 mg  2 mg Oral PRN  Temiloluwa Recchia, [name], MD   2 mg at 04/23/22 2200   [START ON 05/30/2022] paliperidone (INVEGA SUSTENNA) injection 156 mg  156 mg Intramuscular Q28 days Algis Lehenbauer, [name], MD       ziprasidone (GEODON) injection 20 mg  20 mg Intramuscular Q12H PRN [name], MD        Lab Results:  Results for orders placed or performed during the hospital encounter of 04/16/22 (from the past 48 hour(s))  CBC with Differential/Platelet     Status: None   Collection Time: 05/15/22  6:56 AM  Result Value Ref Range   WBC 6.9 4.0 - 10.5 K/uL   RBC 4.60 4.22 - 5.81 MIL/uL   Hemoglobin 14.5 13.0 - 17.0 g/dL   HCT 42.2 39.0 - 52.0 %   MCV 91.7 80.0 - 100.0 fL   MCH 31.5 26.0 - 34.0 pg   MCHC 34.4 30.0 - 36.0 g/dL   RDW 12.3 11.5 - 15.5 %   Platelets 267 150 - 400 K/uL   nRBC 0.0 0.0 - 0.2 %   Neutrophils Relative % 44 %    Neutro Abs 3.0 1.7 - 7.7 K/uL   Lymphocytes Relative 46 %   Lymphs Abs 3.2 0.7 - 4.0 K/uL   Monocytes Relative 7 %   Monocytes Absolute 0.5 0.1 - 1.0 K/uL   Eosinophils Relative 1 %   Eosinophils Absolute 0.1 0.0 - 0.5 K/uL   Basophils Relative 1 %   Basophils Absolute 0.0 0.0 - 0.1 K/uL   Immature Granulocytes 1 %   Abs Immature Granulocytes 0.04 0.00 - 0.07 K/uL    Comment: Performed at [facility], [address] [place], [place] [address]    Blood Alcohol level:  Lab Results  Component Value Date   [facility] <10 60/45/4098    Metabolic Disorder Labs: Lab Results  Component Value Date   HGBA1C 5.5 04/24/2022   MPG 111 04/24/2022   No results found for: "PROLACTIN" Lab Results  Component Value Date   CHOL 220 (H) 04/24/2022   TRIG 700 (H) 04/24/2022   HDL 35 (L) 04/24/2022   CHOLHDL 6.3 04/24/2022   VLDL UNABLE TO CALCULATE IF TRIGLYCERIDE OVER 400 mg/dL 04/24/2022   LDLCALC UNABLE TO CALCULATE IF TRIGLYCERIDE OVER 400 mg/dL 04/24/2022    Physical Findings: AIMS: Facial and Oral Movements Muscles of Facial Expression: None, normal Lips and Perioral Area: None, normal Jaw: None, normal Tongue: None, normal,Extremity Movements Upper (arms, wrists, hands, fingers): None, normal Lower (legs, knees, ankles, toes): None, normal, Trunk Movements Neck, shoulders, hips: None, normal, Overall Severity Severity of abnormal movements (highest score from questions above): None, normal Incapacitation due to abnormal movements: None, normal Patient's awareness of abnormal movements (rate only patient's report): No Awareness, Dental Status Current problems with teeth and/or dentures?: No Does patient usually wear dentures?: No  CIWA:    COWS:     Musculoskeletal: Strength & Muscle Tone: within normal limits Gait & Station: normal Patient leans: N/A  Psychiatric Specialty Exam:  Presentation  General Appearance:  Disheveled  Eye  Contact: Minimal  Speech: Pressured  Speech Volume: Normal  Handedness: Right   Mood and Affect  Mood: Euphoric; Irritable  Affect: Inappropriate   Thought Process  Thought Processes: Disorganized  Descriptions of Associations:Loose  Orientation:Partial  Thought Content:Illogical; Scattered  History of Schizophrenia/Schizoaffective disorder:No data recorded Duration of Psychotic Symptoms:No data recorded Hallucinations:No data recorded Ideas of Reference:Delusions; Paranoia  Suicidal Thoughts:No data recorded Homicidal Thoughts:No data recorded  Sensorium  Memory: Immediate Poor; Recent Poor; Remote Poor  Judgment: Poor  Insight: Poor   Executive Functions  Concentration: Fair  Attention Span: Fair  Recall: Poor  Fund of Knowledge: Poor  Language: Poor   Psychomotor Activity  Psychomotor Activity:No data recorded  Assets  Assets: Desire for Improvement; Social Support   Sleep  Sleep:No data recorded   Physical Exam: Physical Exam Vitals and nursing note reviewed.  Constitutional:      Appearance: Normal appearance.  HENT:     Head: Normocephalic and atraumatic.     Mouth/Throat:     Pharynx: Oropharynx is clear.  Eyes:     Pupils: Pupils are equal, round, and reactive to light.  Cardiovascular:     Rate and Rhythm: Normal rate and regular rhythm.  Pulmonary:     Effort: Pulmonary effort is normal.     Breath sounds: Normal breath sounds.  Abdominal:     General: Abdomen is flat.     Palpations: Abdomen is soft.  Musculoskeletal:        General: Normal range of motion.  Skin:    General: Skin is warm and dry.  Neurological:     General: No focal deficit present.     Mental Status: He is alert. Mental status is at baseline.  Psychiatric:        Attention and Perception: He is inattentive.        Mood and Affect: Mood normal. Affect is blunt.        Speech: Speech normal.        Behavior: Behavior is withdrawn.         Thought Content: Thought content normal.    Review of Systems  Constitutional: Negative.   HENT: Negative.    Eyes: Negative.   Respiratory: Negative.    Cardiovascular: Negative.   Gastrointestinal: Negative.   Musculoskeletal: Negative.   Skin: Negative.   Neurological: Negative.   Psychiatric/Behavioral: Negative.     Blood pressure 117/80, pulse (!) 110, temperature 97.9 F (36.6 C), temperature source Oral, resp. rate 18, height 5\' 9"  (1.753 m), weight 96.4 kg, SpO2 98 %. Body mass index is 31.38 kg/m.   Treatment Plan Summary: Medication management and Plan patient appears to be stable.  Tolerated the increase in the dose of clozapine.  May increase it again in another day or so if he continues to tolerate it well.  Continue to mainly focus on discharge.  Patient inquires appropriately today whether [facility] has found a place for him and was told we had no news.  Alethia Berthold, MD 05/15/2022, 2:23 PM

## 2022-05-15 NOTE — Plan of Care (Signed)
D: Patient alert and oriented. Patient denies pain. Patient denies anxiety and depression. Patient denies SI/HI/AVH. Patient remains isolative during shift with exception to coming out for meals. Patient did not have any scheduled medication during this shift.  A: Support and encouragement provided to patient.  Q15 minute safety checks maintained.   R: Patient compliant with treatment plan. No adverse drug reactions noted. Patient remains safe on the unit at this time. Problem: Education: Goal: Knowledge of General Education information will improve Description: Including pain rating scale, medication(s)/side effects and non-pharmacologic comfort measures Outcome: Progressing   Problem: Education: Goal: Knowledge of [place] [facility] Education information/materials will improve Outcome: Progressing Goal: Verbalization of understanding the information provided will improve Outcome: Progressing   Problem: Health Behavior/Discharge Planning: Goal: Compliance with treatment plan for underlying cause of condition will improve Outcome: Progressing   Problem: Physical Regulation: Goal: Ability to maintain clinical measurements within normal limits will improve Outcome: Progressing   Problem: Safety: Goal: Periods of time without injury will increase Outcome: Progressing   Problem: Education: Goal: Knowledge of the prescribed therapeutic regimen will improve Outcome: Progressing   Problem: Nutritional: Goal: Ability to achieve adequate nutritional intake will improve Outcome: Progressing

## 2022-05-15 NOTE — Consult Note (Signed)
Pharmacy Consult - Clozapine     42 yo male ordered clozapine 100 mg PO daily  This patient's order has been reviewed for prescribing contraindications.    Clozapine REMS enrollment Verified: yes on 04/17/22 REMS patient ID: [id number] Current Outpatient Monitoring: Every 2 weeks   Home Regimen:  225 mg po BID Last dose: unknown   Dose Adjustments This Admission: Dose started at clozapine 100 mg po daily Dose is currently clozapine 250 mg po HS (started 1/22)   Labs: Date    ANC    Submitted? 12/27 2800 Yes 01/03 3500 Yes 01/10   3100 Yes 01/24 3000 Yes   Plan: Continue clozapine 250 mg po HS Monitor ANC at least weekly while inpatient     Beatris Si, PharmD Clinical Pharmacist  05/15/2022 9:54 AM

## 2022-05-15 NOTE — NC FL2 (Signed)
  [place] LEVEL OF CARE FORM     IDENTIFICATION  Patient Name: Travis Sandoval Birthdate: 09-24-1980 Sex: male Admission Date (Current Location): 04/16/2022  [facility] and [place] Number:  Engineering geologist and Address:  [facility], [address] [place], [place] [address]      Provider Number: [id number]  Attending Physician Name and Address:  [name], [name], MD  Relative Name and Phone Number:       Current Level of Care: Hospital Recommended Level of Care: [place], Other (Comment) (Group Home) Prior Approval Number:    Date Approved/Denied:   PASRR Number:    Discharge Plan: Other (Comment) ([place], Group Home)    Current Diagnoses: Patient Active Problem List   Diagnosis Date Noted   Schizophrenia ([place]) 04/19/2022   Bipolar disorder ([place]) 04/15/2022   Psychosis ([place]) 04/15/2022   Attention deficit hyperactivity disorder (ADHD) 04/15/2022    Orientation RESPIRATION BLADDER Height & Weight     Self, Time, Place  Normal Continent Weight: 212 lb 8 oz (96.4 kg) Height:  5\' 9"  (175.3 cm)  BEHAVIORAL SYMPTOMS/MOOD NEUROLOGICAL BOWEL NUTRITION STATUS  Other (Comment) (NA)  (NA) Continent Diet (Regular)  AMBULATORY STATUS COMMUNICATION OF NEEDS Skin   Independent Verbally Normal                       Personal Care Assistance Level of Assistance   (NA)           Functional Limitations Info   (NA)          SPECIAL CARE FACTORS FREQUENCY                       Contractures Contractures Info: Not present    Additional Factors Info  Code Status, Allergies Code Status Info: Full Allergies Info: Codiene           Current Medications (05/15/2022):  This is the current hospital active medication list Current Facility-Administered Medications  Medication Dose Route Frequency Provider Last Rate Last Admin   acetaminophen (TYLENOL) tablet 650 mg  650 mg Oral Q6H PRN [name], NP       alum & mag hydroxide-simeth (MAALOX/MYLANTA) 200-200-20 MG/5ML suspension 30 mL  30 mL Oral Q4H PRN [name], NP   30 mL at 05/14/22 2217   cloZAPine (CLOZARIL) tablet 250 mg  250 mg Oral QHS [name], [name], MD   250 mg at 05/14/22 2207   magnesium hydroxide (MILK OF MAGNESIA) suspension 30 mL  30 mL Oral Daily PRN [name], NP       nicotine polacrilex (NICORETTE) gum 2 mg  2 mg Oral PRN [name], [name], MD   2 mg at 04/23/22 2200   [START ON 05/30/2022] paliperidone (INVEGA SUSTENNA) injection 156 mg  156 mg Intramuscular Q28 days [name], [name], MD       ziprasidone (GEODON) injection 20 mg  20 mg Intramuscular Q12H PRN [name], MD         Discharge Medications: Please see discharge summary for a list of discharge medications.  Relevant Imaging Results:  Relevant Lab Results:   Additional Information    Rozann Lesches, LCSW

## 2022-05-15 NOTE — Progress Notes (Signed)
Patient calm and pleasant during assessment denying SI/HI/AVH. Pt isoaltive to his room tonight but did come out for medication and snacks. Pt compliant with medication administration per MD orders. Pt given education, support, and encouragement to be acitve in his treatment plan. Pt being monitored Q 15 minutes for safety per unit protocol, remains safe on the unit  

## 2022-05-15 NOTE — BHH Counselor (Signed)
CSW spoke with the patients care team to address his current hospital stay and discharge plans.  Care Team included: [place] team; Devota Pace with [facility]; and patient's father.  Team discussed guardianship.  Ellison Hughs reports that father and her have spoken with [facility] on guardianship, father is pursuing guardianship at this time and has plans to follow up with [facility].  Jerene Pitch has reached ou tto 11 homes for placement, only a couple have responded.  [facility] needs assessment and FL2 for further discussions.  CSW completed FL2 and awaiting cosign from psychiatrist.  CSW to provide to [facility].  Patient's previous group home has sent letter terminating contact with patient, this home is no longer an option.  Previous group home is not the payee.  Patient has not current payee.  Social Security is pending awaiting someone to state they are the payee, meaning patient has funding but no payee at this time.  Father confirms patient has Medicaid and SSI.  [facility] requests that verbal permission be obtained that patient is agreeable to them referring to group homes and if patient is open to group home placements that are not his previous home.  CSW team to follow up.  Assunta Curtis, MSW, LCSW 05/15/2022 1:34 PM

## 2022-05-15 NOTE — Progress Notes (Signed)
Recreation Therapy Notes  Date: 05/15/2022   Time: 10:30 am   Location: Courtyard      Behavioral response: N/A   Intervention Topic: Values    Discussion/Intervention: Patient refused to attend group.    Clinical Observations/Feedback:  Patient refused to attend group.    Kursten Kruk LRT/CTRS          Sharisse Rantz 05/15/2022 11:06 AM 

## 2022-05-16 DIAGNOSIS — F203 Undifferentiated schizophrenia: Secondary | ICD-10-CM | POA: Diagnosis not present

## 2022-05-16 NOTE — Progress Notes (Signed)
Patient calm and pleasant during assessment denying SI/HI/AVH. Pt isoaltive to his room tonight but did come out for medication and snacks. Pt compliant with medication administration per MD orders. Pt given education, support, and encouragement to be acitve in his treatment plan. Pt being monitored Q 15 minutes for safety per unit protocol, remains safe on the unit  

## 2022-05-16 NOTE — Group Note (Signed)
LCSW Group Therapy Note   Group Date: 05/16/2022 Start Time: 1300 End Time: 1400   Type of Therapy and Topic:  Group Therapy: Boundaries  Participation Level:  Did Not Attend  Description of Group: This group will address the use of boundaries in their personal lives. Patients will explore why boundaries are important, the difference between healthy and unhealthy boundaries, and negative and postive outcomes of different boundaries and will look at how boundaries can be crossed.  Patients will be encouraged to identify current boundaries in their own lives and identify what kind of boundary is being set. Facilitators will guide patients in utilizing problem-solving interventions to address and correct types boundaries being used and to address when no boundary is being used. Understanding and applying boundaries will be explored and addressed for obtaining and maintaining a balanced life. Patients will be encouraged to explore ways to assertively make their boundaries and needs known to significant others in their lives, using other group members and facilitator for role play, support, and feedback.  Therapeutic Goals:  1.  Patient will identify areas in their life where setting clear boundaries could be  used to improve their life.  2.  Patient will identify signs/triggers that a boundary is not being respected. 3.  Patient will identify two ways to set boundaries in order to achieve balance in  their lives: 4.  Patient will demonstrate ability to communicate their needs and set boundaries  through discussion and/or role plays  Summary of Patient Progress:  X  Therapeutic Modalities:   Cognitive Behavioral Therapy Solution-Focused Therapy  Gaurav Baldree J Estha Few, LCSWA 05/16/2022  3:00 PM    

## 2022-05-16 NOTE — Progress Notes (Signed)
[facility] MD Progress Note  05/16/2022 2:04 PM Travis Sandoval  MRN:  [id number] Subjective: Patient seen for follow-up.  42 year old man with schizophrenia.  Patient was sitting up in his room when I came to see him.  Awake.  Made good eye contact and engaged in conversation.  His only concern was wishing he were discharged.  Today he once again told me that if we could not find any other place for him he could go stay in the [employer] building in [place].  This appears to be part of his larger delusion of being in the [employer] and part of [employer].  Otherwise no complaints no physical concerns.  No behavior problems. Principal Problem: Schizophrenia ([place]) Diagnosis: Principal Problem:   Schizophrenia ([place]) Active Problems:   Psychosis ([place])  Total Time spent with patient: 30 minutes  Past Psychiatric History: Past history of schizophrenia apparently responsive in the past to clozapine  Past Medical History:  Past Medical History:  Diagnosis Date   ADHD    Bipolar 1 disorder ([place])    Hepatitis C    Schizoaffective disorder ([place])    History reviewed. No pertinent surgical history. Family History:  Family History  Family history unknown: Yes   Family Psychiatric  History: See previous Social History:  Social History   Substance and Sexual Activity  Alcohol Use Not Currently     Social History   Substance and Sexual Activity  Drug Use Not Currently    Social History   Socioeconomic History   Marital status: Unknown    Spouse name: Not on file   Number of children: Not on file   Years of education: Not on file   Highest education level: Not on file  Occupational History   Not on file  Tobacco Use   Smoking status: Every Day    Packs/day: 0.25    Years: 15.00    Total pack years: 3.75    Types: Cigarettes   Smokeless tobacco: Not on file  Vaping Use   Vaping Use: Not on file  Substance and Sexual Activity   Alcohol use: Not Currently   Drug use: Not  Currently   Sexual activity: Not Currently  Other Topics Concern   Not on file  Social History Narrative   Not on file   Social Determinants of Health   Financial Resource Strain: Not on file  Food Insecurity: No Food Insecurity (04/16/2022)   Hunger Vital Sign    Worried About Running Out of Food in the Last Year: Never true    Ran Out of Food in the Last Year: Never true  Transportation Needs: No Transportation Needs (04/16/2022)   PRAPARE - Hydrologist (Medical): No    Lack of Transportation (Non-Medical): No  Physical Activity: Not on file  Stress: Not on file  Social Connections: Not on file   Additional Social History:                         Sleep: Fair  Appetite:  Fair  Current Medications: Current Facility-Administered Medications  Medication Dose Route Frequency Provider Last Rate Last Admin   acetaminophen (TYLENOL) tablet 650 mg  650 mg Oral Q6H PRN [name], NP       alum & mag hydroxide-simeth (MAALOX/MYLANTA) 200-200-20 MG/5ML suspension 30 mL  30 mL Oral Q4H PRN [name], NP   30 mL at 05/14/22 2217   cloZAPine (CLOZARIL) tablet 250 mg  250 mg Oral QHS Lowen Barringer, [name], MD   250 mg at 05/15/22 2126   magnesium hydroxide (MILK OF MAGNESIA) suspension 30 mL  30 mL Oral Daily PRN [name], NP       nicotine polacrilex (NICORETTE) gum 2 mg  2 mg Oral PRN Mcgwire Dasaro, [name], MD   2 mg at 04/23/22 2200   [START ON 05/30/2022] paliperidone (INVEGA SUSTENNA) injection 156 mg  156 mg Intramuscular Q28 days Pavle Wiler, [name], MD       ziprasidone (GEODON) injection 20 mg  20 mg Intramuscular Q12H PRN [name], MD        Lab Results:  Results for orders placed or performed during the hospital encounter of 04/16/22 (from the past 48 hour(s))  CBC with Differential/Platelet     Status: None   Collection Time: 05/15/22  6:56 AM  Result Value Ref Range   WBC 6.9 4.0 - 10.5 K/uL   RBC 4.60 4.22 - 5.81  MIL/uL   Hemoglobin 14.5 13.0 - 17.0 g/dL   HCT 42.2 39.0 - 52.0 %   MCV 91.7 80.0 - 100.0 fL   MCH 31.5 26.0 - 34.0 pg   MCHC 34.4 30.0 - 36.0 g/dL   RDW 12.3 11.5 - 15.5 %   Platelets 267 150 - 400 K/uL   nRBC 0.0 0.0 - 0.2 %   Neutrophils Relative % 44 %   Neutro Abs 3.0 1.7 - 7.7 K/uL   Lymphocytes Relative 46 %   Lymphs Abs 3.2 0.7 - 4.0 K/uL   Monocytes Relative 7 %   Monocytes Absolute 0.5 0.1 - 1.0 K/uL   Eosinophils Relative 1 %   Eosinophils Absolute 0.1 0.0 - 0.5 K/uL   Basophils Relative 1 %   Basophils Absolute 0.0 0.0 - 0.1 K/uL   Immature Granulocytes 1 %   Abs Immature Granulocytes 0.04 0.00 - 0.07 K/uL    Comment: Performed at [facility], [address] [place], [place] [address]    Blood Alcohol level:  Lab Results  Component Value Date   [facility] <10 93/26/7124    Metabolic Disorder Labs: Lab Results  Component Value Date   HGBA1C 5.5 04/24/2022   MPG 111 04/24/2022   No results found for: "PROLACTIN" Lab Results  Component Value Date   CHOL 220 (H) 04/24/2022   TRIG 700 (H) 04/24/2022   HDL 35 (L) 04/24/2022   CHOLHDL 6.3 04/24/2022   VLDL UNABLE TO CALCULATE IF TRIGLYCERIDE OVER 400 mg/dL 04/24/2022   LDLCALC UNABLE TO CALCULATE IF TRIGLYCERIDE OVER 400 mg/dL 04/24/2022    Physical Findings: AIMS: Facial and Oral Movements Muscles of Facial Expression: None, normal Lips and Perioral Area: None, normal Jaw: None, normal Tongue: None, normal,Extremity Movements Upper (arms, wrists, hands, fingers): None, normal Lower (legs, knees, ankles, toes): None, normal, Trunk Movements Neck, shoulders, hips: None, normal, Overall Severity Severity of abnormal movements (highest score from questions above): None, normal Incapacitation due to abnormal movements: None, normal Patient's awareness of abnormal movements (rate only patient's report): No Awareness, Dental Status Current problems with teeth and/or dentures?: No Does patient usually  wear dentures?: No  CIWA:    COWS:     Musculoskeletal: Strength & Muscle Tone: within normal limits Gait & Station: normal Patient leans: N/A  Psychiatric Specialty Exam:  Presentation  General Appearance:  Disheveled  Eye Contact: Minimal  Speech: Pressured  Speech Volume: Normal  Handedness: Right   Mood and Affect  Mood: Euphoric; Irritable  Affect: Inappropriate   Thought Process  Thought Processes: Disorganized  Descriptions of Associations:Loose  Orientation:Partial  Thought Content:Illogical; Scattered  History of Schizophrenia/Schizoaffective disorder:No data recorded Duration of Psychotic Symptoms:No data recorded Hallucinations:No data recorded Ideas of Reference:Delusions; Paranoia  Suicidal Thoughts:No data recorded Homicidal Thoughts:No data recorded  Sensorium  Memory: Immediate Poor; Recent Poor; Remote Poor  Judgment: Poor  Insight: Poor   Executive Functions  Concentration: Fair  Attention Span: Fair  Recall: Poor  Fund of Knowledge: Poor  Language: Poor   Psychomotor Activity  Psychomotor Activity:No data recorded  Assets  Assets: Desire for Improvement; Social Support   Sleep  Sleep:No data recorded   Physical Exam: Physical Exam Vitals and nursing note reviewed.  Constitutional:      Appearance: Normal appearance.  HENT:     Head: Normocephalic and atraumatic.     Mouth/Throat:     Pharynx: Oropharynx is clear.  Eyes:     Pupils: Pupils are equal, round, and reactive to light.  Cardiovascular:     Rate and Rhythm: Normal rate and regular rhythm.  Pulmonary:     Effort: Pulmonary effort is normal.     Breath sounds: Normal breath sounds.  Abdominal:     General: Abdomen is flat.     Palpations: Abdomen is soft.  Musculoskeletal:        General: Normal range of motion.  Skin:    General: Skin is warm and dry.  Neurological:     General: No focal deficit present.     Mental Status:  He is alert. Mental status is at baseline.  Psychiatric:        Attention and Perception: Attention normal.        Mood and Affect: Mood normal.        Speech: Speech normal.        Behavior: Behavior is cooperative.        Thought Content: Thought content is paranoid and delusional.        Cognition and Memory: Cognition is impaired.    Review of Systems  Constitutional: Negative.   HENT: Negative.    Eyes: Negative.   Respiratory: Negative.    Cardiovascular: Negative.   Gastrointestinal: Negative.   Musculoskeletal: Negative.   Skin: Negative.   Neurological: Negative.   Psychiatric/Behavioral: Negative.     Blood pressure 111/82, pulse (!) 104, temperature 98.9 F (37.2 C), temperature source Oral, resp. rate 18, height 5\' 9"  (1.753 m), weight 96.4 kg, SpO2 99 %. Body mass index is 31.38 kg/m.   Treatment Plan Summary: Medication management and Plan patient remains currently on dischargeable because we have no group home to take him on account of having no funding source for it.  It is the consensus of those working with him that the patient does not have the capacity to care for himself completely independently.  We continue to work with the [facility] agency and work as a team trying to find a place for him to live.  [facility], MD 05/16/2022, 2:04 PM

## 2022-05-16 NOTE — B[facility] Group Notes (Signed)
[facility] [facility] Group Notes:  (Nursing/MHT/Case Management/Adjunct)  Date:  05/16/2022  Time:  8:51 PM  Type of Therapy:   Wrap up  Participation Level:  Did Not Attend   Travis Sandoval 05/16/2022, 8:51 PM

## 2022-05-16 NOTE — Plan of Care (Signed)
D: Patient alert and oriented. Patient denies pain. Patient denies anxiety and depression. Patient denies SI/HI/AVH. Patient remains isolative to room during shift with exception to coming out for meals.   A: patient did not have any scheduled medications during this shift. Support and encouragement provided to patient.  Q15 minute safety checks maintained.   R: Patient compliant with treatment plan. Patient remains safe on the unit at this time. Problem: Education: Goal: Knowledge of [facility] Education information/materials will improve Outcome: Progressing Goal: Mental status will improve Outcome: Not Progressing Goal: Verbalization of understanding the information provided will improve Outcome: Progressing   Problem: Physical Regulation: Goal: Ability to maintain clinical measurements within normal limits will improve Outcome: Progressing   Problem: Safety: Goal: Periods of time without injury will increase Outcome: Progressing

## 2022-05-17 DIAGNOSIS — F203 Undifferentiated schizophrenia: Secondary | ICD-10-CM | POA: Diagnosis not present

## 2022-05-17 MED ORDER — HYDROXYZINE HCL 50 MG PO TABS
50.0000 mg | ORAL_TABLET | Freq: Four times a day (QID) | ORAL | Status: DC | PRN
  Administered 2022-05-17 – 2022-08-10 (×4): 50 mg via ORAL
  Filled 2022-05-17 (×6): qty 1

## 2022-05-17 NOTE — Plan of Care (Signed)
  Problem: Nutrition: Goal: Adequate nutrition will be maintained Outcome: Progressing   Problem: Education: Goal: Emotional status will improve Outcome: Progressing   Problem: Education: Goal: Knowledge of the prescribed therapeutic regimen will improve Outcome: Progressing   Problem: Coping: Goal: Level of anxiety will decrease Outcome: Not Progressing   Problem: Education: Goal: Mental status will improve Outcome: Not Progressing

## 2022-05-17 NOTE — BH IP Treatment Plan (Signed)
Interdisciplinary Treatment and Diagnostic Plan Update  05/17/2022 Time of Session: 0900 Travis Sandoval MRN: [id number]  Principal Diagnosis: Schizophrenia [facility])  Secondary Diagnoses: Principal Problem:   Schizophrenia ([place]) Active Problems:   Psychosis ([place])   Current Medications:  Current Facility-Administered Medications  Medication Dose Route Frequency Provider Last Rate Last Admin   acetaminophen (TYLENOL) tablet 650 mg  650 mg Oral Q6H PRN [name], NP       alum & mag hydroxide-simeth (MAALOX/MYLANTA) 200-200-20 MG/5ML suspension 30 mL  30 mL Oral Q4H PRN [name], NP   30 mL at 05/16/22 1922   cloZAPine (CLOZARIL) tablet 250 mg  250 mg Oral QHS [name], [name], MD   250 mg at 05/16/22 2114   hydrOXYzine (ATARAX) tablet 50 mg  50 mg Oral Q6H PRN [name], [name], MD   50 mg at 05/17/22 0944   magnesium hydroxide (MILK OF MAGNESIA) suspension 30 mL  30 mL Oral Daily PRN [name], NP       nicotine polacrilex (NICORETTE) gum 2 mg  2 mg Oral PRN [name], [name], MD   2 mg at 04/23/22 2200   [START ON 05/30/2022] paliperidone (INVEGA SUSTENNA) injection 156 mg  156 mg Intramuscular Q28 days [name], [name], MD       ziprasidone (GEODON) injection 20 mg  20 mg Intramuscular Q12H PRN [name], MD       PTA Medications: Medications Prior to Admission  Medication Sig Dispense Refill Last Dose   ABILIFY MAINTENA 400 MG SRER injection Inject 400 mg into the muscle every 28 (twenty-eight) days.      ARIPiprazole (ABILIFY) 10 MG tablet Take 10 mg by mouth daily. (Patient not taking: Reported on 04/16/2022)      atomoxetine (STRATTERA) 40 MG capsule Take 40 mg by mouth daily. (Patient not taking: Reported on 04/16/2022)      atorvastatin (LIPITOR) 40 MG tablet Take 40 mg by mouth daily.      budesonide-formoterol (SYMBICORT) 160-4.5 MCG/ACT inhaler Inhale 2 puffs into the lungs.      cetirizine (ZYRTEC) 10 MG tablet Take 10 mg by mouth daily.      clozapine  (CLOZARIL) 200 MG tablet Take 200 mg by mouth 2 (two) times daily. Take along with one 25 mg tablet for total 225 mg twice daily      cloZAPine (CLOZARIL) 25 MG tablet Take 25 mg by mouth 2 (two) times daily. Take along with one 200 mg tablet for total 225 mg twice daily      Dexlansoprazole (DEXILANT) 30 MG capsule Take 30 mg by mouth daily. (Patient not taking: Reported on 04/16/2022)      hydrOXYzine (VISTARIL) 50 MG capsule Take 50 mg by mouth 3 (three) times daily as needed for itching. (Patient not taking: Reported on 04/16/2022)      Melatonin 5 MG TABS Take 5 mg by mouth at bedtime as needed (sleep).      metoprolol succinate (TOPROL-XL) 25 MG 24 hr tablet Take 12.5 mg by mouth daily.      Phosphatidyl Choline 65 MG TABS Take 1 tablet by mouth daily.  (Patient not taking: Reported on 04/16/2022)      valproic acid (DEPAKENE) 250 MG/5ML solution Take 750-1,000 mLs by mouth 2 (two) times daily. 1000 mg (20 mL) every morning and 750 mg (15 mL) daily at bedtime       Patient Stressors:    Patient Strengths:    Treatment Modalities: Medication Management,  Group therapy, Case management,  1 to 1 session with clinician, Psychoeducation, Recreational therapy.   Physician Treatment Plan for Primary Diagnosis: Schizophrenia ([place]) Long Term Goal(s):     Short Term Goals: None, pt is a poor hx.  Medication Management: Evaluate patient's response, side effects, and tolerance of medication regimen.  Therapeutic Interventions: 1 to 1 sessions, Unit Group sessions and Medication administration.  Evaluation of Outcomes: Progressing  Physician Treatment Plan for Secondary Diagnosis: Principal Problem:   Schizophrenia ([place]) Active Problems:   Psychosis ([place])  Long Term Goal(s):     Short Term Goals: None, pt is a poor hx.     Medication Management: Evaluate patient's response, side effects, and tolerance of medication regimen.  Therapeutic Interventions: 1 to 1 sessions, Unit Group  sessions and Medication administration.  Evaluation of Outcomes: Progressing   RN Treatment Plan for Primary Diagnosis: Schizophrenia ([place]) Long Term Goal(s): Knowledge of disease and therapeutic regimen to maintain health will improve  Short Term Goals: Ability to remain free from injury will improve, Ability to verbalize frustration and anger appropriately will improve, Ability to demonstrate self-control, Ability to participate in decision making will improve, Ability to verbalize feelings will improve, Ability to disclose and discuss suicidal ideas, Ability to identify and develop effective coping behaviors will improve, and Compliance with prescribed medications will improve  Medication Management: RN will administer medications as ordered by provider, will assess and evaluate patient's response and provide education to patient for prescribed medication. RN will report any adverse and/or side effects to prescribing provider.  Therapeutic Interventions: 1 on 1 counseling sessions, Psychoeducation, Medication administration, Evaluate responses to treatment, Monitor vital signs and CBGs as ordered, Perform/monitor CIWA, COWS, AIMS and Fall Risk screenings as ordered, Perform wound care treatments as ordered.  Evaluation of Outcomes: Progressing   LCSW Treatment Plan for Primary Diagnosis: Schizophrenia ([name]) Long Term Goal(s): Safe transition to appropriate next level of care at discharge, Engage patient in therapeutic group addressing interpersonal concerns.  Short Term Goals: Engage patient in aftercare planning with referrals and resources, Increase social support, Increase ability to appropriately verbalize feelings, Increase emotional regulation, Facilitate acceptance of mental health diagnosis and concerns, Facilitate patient progression through stages of change regarding substance use diagnoses and concerns, Identify triggers associated with mental health/substance abuse issues, and  Increase skills for wellness and recovery  Therapeutic Interventions: Assess for all discharge needs, 1 to 1 time with Social worker, Explore available resources and support systems, Assess for adequacy in community support network, Educate family and significant other(s) on suicide prevention, Complete Psychosocial Assessment, Interpersonal group therapy.  Evaluation of Outcomes: Progressing   Progress in Treatment: Attending groups: No. Participating in groups: No. Taking medication as prescribed: Yes. Toleration medication: Yes. Family/Significant other contact made: No, will contact:  Patient declined consent for CSW to reach family/freind  Patient understands diagnosis: No. Discussing patient identified problems/goals with staff: Yes. Medical problems stabilized or resolved: Yes. Denies suicidal/homicidal ideation: No. Issues/concerns per patient self-inventory: Yes. Other: none  New problem(s) identified: No, Describe:  none  New Short Term/Long Term Goal(s): Patient to work towards elimination of symptoms of psychosis, medication management for mood stabilization; development of comprehensive mental wellness plan.  Patient Goals: No additional goals identified at this time. Patient to continue to work towards original goals identified in initial treatment team meeting. CSW will remain available to patient should they voice additional treatment goals.   Discharge Plan or Barriers: Patient lacks adequate housing/supervision. Patient high risk of inadvertent harm to  self due to chronic psychotic features. CSW to continue pursuing placement. Situation ongoing, CSW will continue to monitor and update note as more information becomes available.   Reason for Continuation of Hospitalization: Delusions  Medication stabilization  Estimated Length of Stay: 1-7 days   Last 3 [place] Suicide Severity Risk Score: Flowsheet Row Admission (Current) from 04/16/2022 in [facility] INPATIENT  BEHAVIORAL MEDICINE ED from 04/15/2022 in [facility] Emergency Department at [facility]  C-SSRS RISK CATEGORY No Risk No Risk      Scribe for Treatment Team: Almedia Balls 05/17/2022 9:55 AM

## 2022-05-17 NOTE — Progress Notes (Signed)
[facility] MD Progress Note  05/17/2022 2:09 PM Travis Sandoval  MRN:  [id number] Subjective: Follow-up for 42 year old man with schizophrenia.  No new complaints.  No change to behavior.  Mostly isolated.  No physical problems.  We are still waiting on some kind of discharge plan.  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 Principal Problem: Schizophrenia ([place]) Diagnosis: Principal Problem:   Schizophrenia ([place]) Active Problems:   Psychosis ([place])  Total Time spent with patient: 30 minutes  Past Psychiatric History: Past history of schizophrenia  Past Medical History:  Past Medical History:  Diagnosis Date   ADHD    Bipolar 1 disorder ([place])    Hepatitis C    Schizoaffective disorder ([place])    History reviewed. No pertinent surgical history. Family History:  Family History  Family history unknown: Yes   Family Psychiatric  History: See previous Social History:  Social History   Substance and Sexual Activity  Alcohol Use Not Currently     Social History   Substance and Sexual Activity  Drug Use Not Currently    Social History   Socioeconomic History   Marital status: Unknown    Spouse name: Not on file   Number of children: Not on file   Years of education: Not on file   Highest education level: Not on file  Occupational History   Not on file  Tobacco Use   Smoking status: Every Day    Packs/day: 0.25    Years: 15.00    Total pack years: 3.75    Types: Cigarettes   Smokeless tobacco: Not on file  Vaping Use   Vaping Use: Not on file  Substance and Sexual Activity   Alcohol use: Not Currently   Drug use: Not Currently   Sexual activity: Not Currently  Other Topics Concern   Not on file  Social History Narrative   Not on file   Social Determinants of Health   Financial Resource Strain: Not on file  Food Insecurity: No Food Insecurity (04/16/2022)   Hunger Vital Sign    Worried About Running Out of Food in the Last  Year: Never true    Ran Out of Food in the Last Year: Never true  Transportation Needs: No Transportation Needs (04/16/2022)   [facility] - Hydrologist (Medical): No    Lack of Transportation (Non-Medical): No  Physical Activity: Not on file  Stress: Not on file  Social Connections: Not on file   Additional Social History:                         Sleep: Fair  Appetite:  Fair  Current Medications: Current Facility-Administered Medications  Medication Dose Route Frequency Provider Last Rate Last Admin   acetaminophen (TYLENOL) tablet 650 mg  650 mg Oral Q6H PRN [name], NP       alum & mag hydroxide-simeth (MAALOX/MYLANTA) 200-200-20 MG/5ML suspension 30 mL  30 mL Oral Q4H PRN [name], NP   30 mL at 05/17/22 1356   cloZAPine (CLOZARIL) tablet 250 mg  250 mg Oral QHS Shaina Gullatt T, MD   250 mg at 05/16/22 2114   hydrOXYzine (ATARAX) tablet 50 mg  50 mg Oral Q6H PRN Nesha Counihan, [name], MD   50 mg at 05/17/22 0944   magnesium hydroxide (MILK OF MAGNESIA) suspension 30 mL  30 mL Oral Daily PRN [name], NP  nicotine polacrilex (NICORETTE) gum 2 mg  2 mg Oral PRN Zvi Duplantis T, MD   2 mg at 04/23/22 2200   [START ON 05/30/2022] paliperidone (INVEGA SUSTENNA) injection 156 mg  156 mg Intramuscular Q28 days Jerri Glauser T, MD       ziprasidone (GEODON) injection 20 mg  20 mg Intramuscular Q12H PRN [name], MD        Lab Results: No results found for this or any previous visit (from the past 3 hour(s)).  Blood Alcohol level:  Lab Results  Component Value Date   ETH <10 97/98/9211    Metabolic Disorder Labs: Lab Results  Component Value Date   HGBA1C 5.5 04/24/2022   MPG 111 04/24/2022   No results found for: "PROLACTIN" Lab Results  Component Value Date   CHOL 220 (H) 04/24/2022   TRIG 700 (H) 04/24/2022   HDL 35 (L) 04/24/2022   CHOLHDL 6.3 04/24/2022   VLDL UNABLE TO CALCULATE IF TRIGLYCERIDE  OVER 400 mg/dL 04/24/2022   LDLCALC UNABLE TO CALCULATE IF TRIGLYCERIDE OVER 400 mg/dL 04/24/2022    Physical Findings: AIMS: Facial and Oral Movements Muscles of Facial Expression: None, normal Lips and Perioral Area: None, normal Jaw: None, normal Tongue: None, normal,Extremity Movements Upper (arms, wrists, hands, fingers): None, normal Lower (legs, knees, ankles, toes): None, normal, Trunk Movements Neck, shoulders, hips: None, normal, Overall Severity Severity of abnormal movements (highest score from questions above): None, normal Incapacitation due to abnormal movements: None, normal Patient's awareness of abnormal movements (rate only patient's report): No Awareness, Dental Status Current problems with teeth and/or dentures?: No Does patient usually wear dentures?: No  CIWA:    COWS:     Musculoskeletal: Strength & Muscle Tone: within normal limits Gait & Station: normal Patient leans: N/A  Psychiatric Specialty Exam:  Presentation  General Appearance:  Disheveled  Eye Contact: Minimal  Speech: Pressured  Speech Volume: Normal  Handedness: Right   Mood and Affect  Mood: Euphoric; Irritable  Affect: Inappropriate   Thought Process  Thought Processes: Disorganized  Descriptions of Associations:Loose  Orientation:Partial  Thought Content:Illogical; Scattered  History of Schizophrenia/Schizoaffective disorder:No data recorded Duration of Psychotic Symptoms:No data recorded Hallucinations:No data recorded Ideas of Reference:Delusions; Paranoia  Suicidal Thoughts:No data recorded Homicidal Thoughts:No data recorded  Sensorium  Memory: Immediate Poor; Recent Poor; Remote Poor  Judgment: Poor  Insight: Poor   Executive Functions  Concentration: Fair  Attention Span: Fair  Recall: Poor  Fund of Knowledge: Poor  Language: Poor   Psychomotor Activity  Psychomotor Activity:No data recorded  Assets  Assets: Desire for  Improvement; Social Support   Sleep  Sleep:No data recorded   Physical Exam: Physical Exam Vitals and nursing note reviewed.  Constitutional:      Appearance: Normal appearance.  HENT:     Head: Normocephalic and atraumatic.     Mouth/Throat:     Pharynx: Oropharynx is clear.  Eyes:     Pupils: Pupils are equal, round, and reactive to light.  Cardiovascular:     Rate and Rhythm: Normal rate and regular rhythm.  Pulmonary:     Effort: Pulmonary effort is normal.     Breath sounds: Normal breath sounds.  Abdominal:     General: Abdomen is flat.     Palpations: Abdomen is soft.  Musculoskeletal:        General: Normal range of motion.  Skin:    General: Skin is warm and dry.  Neurological:     General: No focal deficit  present.     Mental Status: He is alert. Mental status is at baseline.  Psychiatric:        Mood and Affect: Mood normal.        Thought Content: Thought content is delusional.    Review of Systems  Constitutional: Negative.   HENT: Negative.    Eyes: Negative.   Respiratory: Negative.    Cardiovascular: Negative.   Gastrointestinal: Negative.   Musculoskeletal: Negative.   Skin: Negative.   Neurological: Negative.   Psychiatric/Behavioral: Negative.     Blood pressure 124/72, pulse (!) 112, temperature 97.7 F (36.5 C), temperature source Oral, resp. rate 18, height 5\' 9"  (1.753 m), weight 96.4 kg, SpO2 97 %. Body mass index is 31.38 kg/m.   Treatment Plan Summary: Medication management and Plan no change to medication management.  Behavior stable.  I am told that there is some possibility of getting him a bed at a group home soon but nothing promised.  , MD 05/17/2022, 2:09 PM

## 2022-05-17 NOTE — Progress Notes (Signed)
Pt denies SI/HI/AVH and verbally agrees to approach staff if these become apparent or before harming themselves/others. Rates depression 0/10. Rates anxiety 8/10. Rates pain 0/10. Pt complained of indigestion and anxiety. Pt could not identify triggers to anxiety. Scheduled medications administered to pt, per MD orders. RN provided support and encouragement to pt. Q15 min safety checks implemented and continued. Pt safe on the unit. RN will continue to monitor and intervene as needed.  05/17/22 0944  Psych Admission Type (Psych Patients Only)  Admission Status Involuntary  Psychosocial Assessment  Patient Complaints Anxiety  Eye Contact Brief  Facial Expression Anxious  Affect Anxious  Speech Logical/coherent  Interaction Assertive;Isolative  Motor Activity Other (Comment) (WDL)  Appearance/Hygiene Unremarkable  Behavior Characteristics Cooperative;Appropriate to situation;Anxious  Mood Anxious;Pleasant  Aggressive Behavior  Effect No apparent injury  Thought Process  Coherency WDL  Content WDL  Delusions None reported or observed  Perception WDL  Hallucination None reported or observed  Judgment Limited  Confusion None  Danger to Self  Current suicidal ideation? Denies  Danger to Others  Danger to Others None reported or observed

## 2022-05-17 NOTE — Group Note (Signed)
[facility] LCSW Group Therapy Note   Group Date: 05/17/2022 Start Time: 1300 End Time: 1400  Type of Therapy and Topic:  Group Therapy:  Feelings around Relapse and Recovery  Participation Level:  Did Not Attend    Description of Group:    Patients in this group will discuss emotions they experience before and after a relapse. They will process how experiencing these feelings, or avoidance of experiencing them, relates to having a relapse. Facilitator will guide patients to explore emotions they have related to recovery. Patients will be encouraged to process which emotions are more powerful. They will be guided to discuss the emotional reaction significant others in their lives may have to patients' relapse or recovery. Patients will be assisted in exploring ways to respond to the emotions of others without this contributing to a relapse.  Therapeutic Goals: Patient will identify two or more emotions that lead to relapse for them:  Patient will identify two emotions that result when they relapse:  Patient will identify two emotions related to recovery:  Patient will demonstrate ability to communicate their needs through discussion and/or role plays.   Summary of Patient Progress: X   Therapeutic Modalities:   Cognitive Behavioral Therapy Solution-Focused Therapy Assertiveness Training Relapse Prevention Therapy   Onofrio Klemp R Catalina Salasar, LCSW 

## 2022-05-18 DIAGNOSIS — F200 Paranoid schizophrenia: Secondary | ICD-10-CM | POA: Diagnosis not present

## 2022-05-18 NOTE — Progress Notes (Signed)
Patient was cooperative with treatment, he remained isolative to room at most times, he was compliant with medications on shift, he denies SI, HI & AVH. He denies depression and he denies anxiety, he seemed to sleep well through out the night.

## 2022-05-18 NOTE — Plan of Care (Signed)

## 2022-05-18 NOTE — Progress Notes (Signed)
[facility] MD Progress Note  05/18/2022 1:06 PM Travis Sandoval  MRN:  [id number] Subjective: Patient is seen on rounds.  He has been compliant with medications.  He denies any side effects.  He states that when he is discharged he is going to [place].  Nurses report no issues. Principal Problem: Schizophrenia ([place]) Diagnosis: Principal Problem:   Schizophrenia ([place]) Active Problems:   Psychosis ([place])  Total Time spent with patient: 15 minutes  Past Psychiatric History: Schizophrenia  Past Medical History:  Past Medical History:  Diagnosis Date   ADHD    Bipolar 1 disorder ([place])    Hepatitis C    Schizoaffective disorder ([place])    History reviewed. No pertinent surgical history. Family History:  Family History  Family history unknown: Yes   Family Psychiatric  History: Unremarkable Social History:  Social History   Substance and Sexual Activity  Alcohol Use Not Currently     Social History   Substance and Sexual Activity  Drug Use Not Currently    Social History   Socioeconomic History   Marital status: Unknown    Spouse name: Not on file   Number of children: Not on file   Years of education: Not on file   Highest education level: Not on file  Occupational History   Not on file  Tobacco Use   Smoking status: Every Day    Packs/day: 0.25    Years: 15.00    Total pack years: 3.75    Types: Cigarettes   Smokeless tobacco: Not on file  Vaping Use   Vaping Use: Not on file  Substance and Sexual Activity   Alcohol use: Not Currently   Drug use: Not Currently   Sexual activity: Not Currently  Other Topics Concern   Not on file  Social History Narrative   Not on file   Social Determinants of Health   Financial Resource Strain: Not on file  Food Insecurity: Unknown (05/18/2022)   Hunger Vital Sign    Worried About Running Out of Food in the Last Year: Patient refused    [place] in the Last Year: Patient refused  Transportation Needs: Unknown (05/18/2022)    [place] - Hydrologist (Medical): Patient refused    Lack of Transportation (Non-Medical): Patient refused  Physical Activity: Not on file  Stress: Not on file  Social Connections: Not on file   Additional Social History:                         Sleep: Fair  Appetite:  Fair  Current Medications: Current Facility-Administered Medications  Medication Dose Route Frequency Provider Last Rate Last Admin   acetaminophen (TYLENOL) tablet 650 mg  650 mg Oral Q6H PRN [name], NP       alum & mag hydroxide-simeth (MAALOX/MYLANTA) 200-200-20 MG/5ML suspension 30 mL  30 mL Oral Q4H PRN [name], NP   30 mL at 05/17/22 1356   cloZAPine (CLOZARIL) tablet 250 mg  250 mg Oral QHS [name], [name], MD   250 mg at 05/18/22 3614   hydrOXYzine (ATARAX) tablet 50 mg  50 mg Oral Q6H PRN [name], [name], MD   50 mg at 05/17/22 0944   magnesium hydroxide (MILK OF MAGNESIA) suspension 30 mL  30 mL Oral Daily PRN [name], NP       nicotine polacrilex (NICORETTE) gum 2 mg  2 mg Oral PRN [name], John  T, MD   2 mg at 04/23/22 2200   [START ON 05/30/2022] paliperidone (INVEGA SUSTENNA) injection 156 mg  156 mg Intramuscular Q28 days [name], [name], MD       ziprasidone (GEODON) injection 20 mg  20 mg Intramuscular Q12H PRN Reggie Pile, MD        Lab Results: No results found for this or any previous visit (from the past 48 hour(s)).  Blood Alcohol level:  Lab Results  Component Value Date   ETH <10 04/15/2022    Metabolic Disorder Labs: Lab Results  Component Value Date   HGBA1C 5.5 04/24/2022   MPG 111 04/24/2022   No results found for: "PROLACTIN" Lab Results  Component Value Date   CHOL 220 (H) 04/24/2022   TRIG 700 (H) 04/24/2022   HDL 35 (L) 04/24/2022   CHOLHDL 6.3 04/24/2022   VLDL UNABLE TO CALCULATE IF TRIGLYCERIDE OVER 400 mg/dL 32/20/2542   LDLCALC UNABLE TO CALCULATE IF TRIGLYCERIDE OVER 400 mg/dL 70/62/3762     Physical Findings: AIMS: Facial and Oral Movements Muscles of Facial Expression: None, normal Lips and Perioral Area: None, normal Jaw: None, normal Tongue: None, normal,Extremity Movements Upper (arms, wrists, hands, fingers): None, normal Lower (legs, knees, ankles, toes): None, normal, Trunk Movements Neck, shoulders, hips: None, normal, Overall Severity Severity of abnormal movements (highest score from questions above): None, normal Incapacitation due to abnormal movements: None, normal Patient's awareness of abnormal movements (rate only patient's report): No Awareness, Dental Status Current problems with teeth and/or dentures?: No Does patient usually wear dentures?: No  CIWA:    COWS:     Musculoskeletal: Strength & Muscle Tone: within normal limits Gait & Station: normal Patient leans: N/A  Psychiatric Specialty Exam:  Presentation  General Appearance:  Disheveled  Eye Contact: Minimal  Speech: Pressured  Speech Volume: Normal  Handedness: Right   Mood and Affect  Mood: Euphoric; Irritable  Affect: Inappropriate   Thought Process  Thought Processes: Disorganized  Descriptions of Associations:Loose  Orientation:Partial  Thought Content:Illogical; Scattered  History of Schizophrenia/Schizoaffective disorder:No data recorded Duration of Psychotic Symptoms:No data recorded Hallucinations:No data recorded Ideas of Reference:Delusions; Paranoia  Suicidal Thoughts:No data recorded Homicidal Thoughts:No data recorded  Sensorium  Memory: Immediate Poor; Recent Poor; Remote Poor  Judgment: Poor  Insight: Poor   Executive Functions  Concentration: Fair  Attention Span: Fair  Recall: Poor  Fund of Knowledge: Poor  Language: Poor   Psychomotor Activity  Psychomotor Activity:No data recorded  Assets  Assets: Desire for Improvement; Social Support   Sleep  Sleep:No data recorded   Physical Exam: Physical  Exam Vitals and nursing note reviewed.  Constitutional:      Appearance: Normal appearance. He is normal weight.  Neurological:     General: No focal deficit present.     Mental Status: He is alert and oriented to person, place, and time.  Psychiatric:        Mood and Affect: Mood normal.        Behavior: Behavior normal.    Review of Systems  Constitutional: Negative.   HENT: Negative.    Eyes: Negative.   Respiratory: Negative.    Cardiovascular: Negative.   Gastrointestinal: Negative.   Genitourinary: Negative.   Musculoskeletal: Negative.   Skin: Negative.   Neurological: Negative.   Endo/Heme/Allergies: Negative.   Psychiatric/Behavioral: Negative.     Blood pressure 115/86, pulse (!) 114, temperature 97.9 F (36.6 C), temperature source Oral, resp. rate 18, height 5\' 9"  (1.753 m), weight 96.4  kg, SpO2 98 %. Body mass index is 31.38 kg/m.   Treatment Plan Summary: Daily contact with patient to assess and evaluate symptoms and progress in treatment, Medication management, and Plan continue current medications.  Parks Ranger, DO 05/18/2022, 1:06 PM

## 2022-05-18 NOTE — Progress Notes (Signed)
Pt presents depressed, affect blunted. Travis Sandoval denies any acute concerns. He denies any SI HI or AV Hallucinations. Travis Sandoval did complete self inventory and rates 0/10 on scale for depression, anxiety and hopelessness. Pt did Probation officer illegible statement at bottom of paper for his goal. Pt asked if he had any acute concerns for MD and he denies. Denies any SI or HI. Pt is eating and drinking well, able to make his needs known. Will con't to monitor.

## 2022-05-18 NOTE — [facility] Group Notes (Signed)
[facility] Group Notes:  (Nursing/MHT/Case Management/Adjunct)  Date:  05/18/2022  Time:  4:24 PM  Type of Therapy:  Psychoeducational Skills  Participation Level:  Did Not Attend   Travis Sandoval 05/18/2022, 4:24 PM 

## 2022-05-18 NOTE — Tx Team (Signed)
Initial Treatment Plan 05/18/2022 5:37 AM [name] LOV:[id number]    PATIENT STRESSORS: Other: Psychosis     PATIENT STRENGTHS: Ability for insight  Active sense of humor  Average or above average intelligence  Capable of independent living  Supportive family/friends    PATIENT IDENTIFIED PROBLEMS: Psychosis  Substance Abuse                   DISCHARGE CRITERIA:  Ability to meet basic life and health needs Improved stabilization in mood, thinking, and/or behavior Reduction of life-threatening or endangering symptoms to within safe limits Safe-care adequate arrangements made Verbal commitment to aftercare and medication compliance  PRELIMINARY DISCHARGE PLAN: Return to previous living arrangement  PATIENT/FAMILY INVOLVEMENT: This treatment plan has been presented to and reviewed with the patient, [name], and/or family member,.  The patient and family have been given the opportunity to ask questions and make suggestions.  Isabella Bowens, RN 05/18/2022, 5:37 AM

## 2022-05-19 DIAGNOSIS — F200 Paranoid schizophrenia: Secondary | ICD-10-CM | POA: Diagnosis not present

## 2022-05-19 NOTE — Progress Notes (Signed)
[facility] MD Progress Note  05/19/2022 11:21 AM Travis Sandoval  MRN:  [id number] Subjective: Patient is seen on rounds.  He is pleasant and cooperative and compliant with his medication.  No side effects.  He tells me that he is going to [place] when he is discharged. Principal Problem: Schizophrenia ([employer]) Diagnosis: Principal Problem:   Schizophrenia ([place]) Active Problems:   Psychosis ([place])  Total Time spent with patient: 15 minutes  Past Psychiatric History: Schizophrenia  Past Medical History:  Past Medical History:  Diagnosis Date   ADHD    Bipolar 1 disorder ([place])    Hepatitis C    Schizoaffective disorder ([place])    History reviewed. No pertinent surgical history. Family History:  Family History  Family history unknown: Yes   Family Psychiatric  History: Unremarkable Social History:  Social History   Substance and Sexual Activity  Alcohol Use Not Currently     Social History   Substance and Sexual Activity  Drug Use Not Currently    Social History   Socioeconomic History   Marital status: Unknown    Spouse name: Not on file   Number of children: Not on file   Years of education: Not on file   Highest education level: Not on file  Occupational History   Not on file  Tobacco Use   Smoking status: Every Day    Packs/day: 0.25    Years: 15.00    Total pack years: 3.75    Types: Cigarettes   Smokeless tobacco: Not on file  Vaping Use   Vaping Use: Not on file  Substance and Sexual Activity   Alcohol use: Not Currently   Drug use: Not Currently   Sexual activity: Not Currently  Other Topics Concern   Not on file  Social History Narrative   Not on file   Social Determinants of Health   Financial Resource Strain: Not on file  Food Insecurity: Unknown (05/18/2022)   Hunger Vital Sign    Worried About Running Out of Food in the Last Year: Patient refused    [place] in the Last Year: Patient refused  Transportation Needs: Unknown (05/18/2022)    [place] - Hydrologist (Medical): Patient refused    Lack of Transportation (Non-Medical): Patient refused  Physical Activity: Not on file  Stress: Not on file  Social Connections: Not on file   Additional Social History:                         Sleep: Good  Appetite:  Good  Current Medications: Current Facility-Administered Medications  Medication Dose Route Frequency Provider Last Rate Last Admin   acetaminophen (TYLENOL) tablet 650 mg  650 mg Oral Q6H PRN [name], NP       alum & mag hydroxide-simeth (MAALOX/MYLANTA) 200-200-20 MG/5ML suspension 30 mL  30 mL Oral Q4H PRN [name], NP   30 mL at 05/18/22 2257   cloZAPine (CLOZARIL) tablet 250 mg  250 mg Oral QHS [name], [name], MD   250 mg at 05/18/22 2208   hydrOXYzine (ATARAX) tablet 50 mg  50 mg Oral Q6H PRN [name], [name], MD   50 mg at 05/17/22 0944   magnesium hydroxide (MILK OF MAGNESIA) suspension 30 mL  30 mL Oral Daily PRN [name], NP       nicotine polacrilex (NICORETTE) gum 2 mg  2 mg Oral PRN [name], [name], MD  2 mg at 04/23/22 2200   [START ON 05/30/2022] paliperidone (INVEGA SUSTENNA) injection 156 mg  156 mg Intramuscular Q28 days [name], [name], MD       ziprasidone (GEODON) injection 20 mg  20 mg Intramuscular Q12H PRN [name], MD        Lab Results: No results found for this or any previous visit (from the past 85 hour(s)).  Blood Alcohol level:  Lab Results  Component Value Date   ETH <10 64/40/3474    Metabolic Disorder Labs: Lab Results  Component Value Date   HGBA1C 5.5 04/24/2022   MPG 111 04/24/2022   No results found for: "PROLACTIN" Lab Results  Component Value Date   CHOL 220 (H) 04/24/2022   TRIG 700 (H) 04/24/2022   HDL 35 (L) 04/24/2022   CHOLHDL 6.3 04/24/2022   VLDL UNABLE TO CALCULATE IF TRIGLYCERIDE OVER 400 mg/dL 04/24/2022   LDLCALC UNABLE TO CALCULATE IF TRIGLYCERIDE OVER 400 mg/dL 04/24/2022     Physical Findings: AIMS: Facial and Oral Movements Muscles of Facial Expression: None, normal Lips and Perioral Area: None, normal Jaw: None, normal Tongue: None, normal,Extremity Movements Upper (arms, wrists, hands, fingers): None, normal Lower (legs, knees, ankles, toes): None, normal, Trunk Movements Neck, shoulders, hips: None, normal, Overall Severity Severity of abnormal movements (highest score from questions above): None, normal Incapacitation due to abnormal movements: None, normal Patient's awareness of abnormal movements (rate only patient's report): No Awareness, Dental Status Current problems with teeth and/or dentures?: No Does patient usually wear dentures?: No  CIWA:    COWS:     Musculoskeletal: Strength & Muscle Tone: within normal limits Gait & Station: normal Patient leans: N/A  Psychiatric Specialty Exam:  Presentation  General Appearance:  Disheveled  Eye Contact: Minimal  Speech: Pressured  Speech Volume: Normal  Handedness: Right   Mood and Affect  Mood: Euphoric; Irritable  Affect: Inappropriate   Thought Process  Thought Processes: Disorganized  Descriptions of Associations:Loose  Orientation:Partial  Thought Content:Illogical; Scattered  History of Schizophrenia/Schizoaffective disorder:No data recorded Duration of Psychotic Symptoms:No data recorded Hallucinations:No data recorded Ideas of Reference:Delusions; Paranoia  Suicidal Thoughts:No data recorded Homicidal Thoughts:No data recorded  Sensorium  Memory: Immediate Poor; Recent Poor; Remote Poor  Judgment: Poor  Insight: Poor   Executive Functions  Concentration: Fair  Attention Span: Fair  Recall: Poor  Fund of Knowledge: Poor  Language: Poor   Psychomotor Activity  Psychomotor Activity:No data recorded  Assets  Assets: Desire for Improvement; Social Support   Sleep  Sleep:No data recorded   Physical Exam: Physical  Exam Vitals and nursing note reviewed.  Constitutional:      Appearance: Normal appearance. He is normal weight.  Neurological:     General: No focal deficit present.     Mental Status: He is alert and oriented to person, place, and time.  Psychiatric:        Mood and Affect: Mood normal.        Behavior: Behavior normal.    Review of Systems  Constitutional: Negative.   HENT: Negative.    Eyes: Negative.   Respiratory: Negative.    Cardiovascular: Negative.   Gastrointestinal: Negative.   Genitourinary: Negative.   Musculoskeletal: Negative.   Skin: Negative.   Neurological: Negative.   Endo/Heme/Allergies: Negative.   Psychiatric/Behavioral: Negative.     Blood pressure 113/79, pulse (!) 112, temperature 97.9 F (36.6 C), temperature source Oral, resp. rate 18, height 5\' 9"  (1.753 m), weight 96.4 kg, SpO2 97 %.  Body mass index is 31.38 kg/m.   Treatment Plan Summary: Daily contact with patient to assess and evaluate symptoms and progress in treatment, Medication management, and Plan continue current medication.  Sarina Ill, DO 05/19/2022, 11:21 AM

## 2022-05-19 NOTE — Progress Notes (Signed)
Patient has been cooperative with treatment on shift, he has been mostly isolative to his room, up for meal and vitals. No new behavioral issues to report on shift at this time.

## 2022-05-19 NOTE — Plan of Care (Signed)
  Problem: Health Behavior/Discharge Planning: Goal: Ability to manage health-related needs will improve Outcome: Progressing   Problem: Coping: Goal: Level of anxiety will decrease Outcome: Progressing   Problem: Pain Managment: Goal: General experience of comfort will improve Outcome: Progressing   

## 2022-05-19 NOTE — Progress Notes (Signed)
Patient denies SI HI AVH.  Alert and Oriented x 4.  No complaints of physical symptoms other than indigestion.  PRN medication provided.  Pt is generally calm and cooperative; isolative with depressed affect.  Continued q 15 minute monitoring for safety.

## 2022-05-19 NOTE — Plan of Care (Signed)
Pt is calm and cooperative, denies SI HI AVH, compliant with meds and considerate and respectful of staff and others.  Pt remains isolative to his room most of shift.  Continued monitoring via q 15 minute checks.

## 2022-05-20 DIAGNOSIS — F203 Undifferentiated schizophrenia: Secondary | ICD-10-CM | POA: Diagnosis not present

## 2022-05-20 NOTE — Progress Notes (Signed)
[name] Progress Note  05/20/2022 2:38 PM Travis Sandoval  MRN:  [id number] Subjective: Follow-up for this patient with schizophrenia.  No complaint.  Stays to himself in his room.  Easy to arouse.  Euthymic affect.  Good eye contact.  Denies suicidal ideation denies current hallucinations. Principal Problem: Schizophrenia ([place]) Diagnosis: Principal Problem:   Schizophrenia ([place]) Active Problems:   Psychosis ([place])  Total Time spent with patient: 30 minutes  Past Psychiatric History: Past history of schizophrenia  Past Medical History:  Past Medical History:  Diagnosis Date   ADHD    Bipolar 1 disorder ([place])    Hepatitis C    Schizoaffective disorder ([place])    History reviewed. No pertinent surgical history. Family History:  Family History  Family history unknown: Yes   Family Psychiatric  History: See previous Social History:  Social History   Substance and Sexual Activity  Alcohol Use Not Currently     Social History   Substance and Sexual Activity  Drug Use Not Currently    Social History   Socioeconomic History   Marital status: Unknown    Spouse name: Not on file   Number of children: Not on file   Years of education: Not on file   Highest education level: Not on file  Occupational History   Not on file  Tobacco Use   Smoking status: Every Day    Packs/day: 0.25    Years: 15.00    Total pack years: 3.75    Types: Cigarettes   Smokeless tobacco: Not on file  Vaping Use   Vaping Use: Not on file  Substance and Sexual Activity   Alcohol use: Not Currently   Drug use: Not Currently   Sexual activity: Not Currently  Other Topics Concern   Not on file  Social History Narrative   Not on file   Social Determinants of Health   Financial Resource Strain: Not on file  Food Insecurity: Unknown (05/18/2022)   Hunger Vital Sign    Worried About Running Out of Food in the Last Year: Patient refused    [place] in the Last Year: Patient refused   Transportation Needs: Unknown (05/18/2022)   [place] - Hydrologist (Medical): Patient refused    Lack of Transportation (Non-Medical): Patient refused  Physical Activity: Not on file  Stress: Not on file  Social Connections: Not on file   Additional Social History:                         Sleep: Fair  Appetite:  Fair  Current Medications: Current Facility-Administered Medications  Medication Dose Route Frequency Provider Last Rate Last Admin   acetaminophen (TYLENOL) tablet 650 mg  650 mg Oral Q6H PRN [name], NP       alum & mag hydroxide-simeth (MAALOX/MYLANTA) 200-200-20 MG/5ML suspension 30 mL  30 mL Oral Q4H PRN [name], NP   30 mL at 05/18/22 2257   cloZAPine (CLOZARIL) tablet 250 mg  250 mg Oral QHS [place] Gentz T, MD   250 mg at 05/19/22 2202   hydrOXYzine (ATARAX) tablet 50 mg  50 mg Oral Q6H PRN Trinty Marken T, MD   50 mg at 05/17/22 0944   magnesium hydroxide (MILK OF MAGNESIA) suspension 30 mL  30 mL Oral Daily PRN [name], NP       nicotine polacrilex (NICORETTE) gum 2 mg  2 mg Oral PRN  Chauncey Sciulli, [name], MD   2 mg at 04/23/22 2200   [START ON 05/30/2022] paliperidone (INVEGA SUSTENNA) injection 156 mg  156 mg Intramuscular Q28 days Graciano Batson T, MD       ziprasidone (GEODON) injection 20 mg  20 mg Intramuscular Q12H PRN Reggie Pile, MD        Lab Results: No results found for this or any previous visit (from the past 48 hour(s)).  Blood Alcohol level:  Lab Results  Component Value Date   ETH <10 04/15/2022    Metabolic Disorder Labs: Lab Results  Component Value Date   HGBA1C 5.5 04/24/2022   MPG 111 04/24/2022   No results found for: "PROLACTIN" Lab Results  Component Value Date   CHOL 220 (H) 04/24/2022   TRIG 700 (H) 04/24/2022   HDL 35 (L) 04/24/2022   CHOLHDL 6.3 04/24/2022   VLDL UNABLE TO CALCULATE IF TRIGLYCERIDE OVER 400 mg/dL 44/04/270   LDLCALC UNABLE TO CALCULATE IF  TRIGLYCERIDE OVER 400 mg/dL 53/66/4403    Physical Findings: AIMS: Facial and Oral Movements Muscles of Facial Expression: None, normal Lips and Perioral Area: None, normal Jaw: None, normal Tongue: None, normal,Extremity Movements Upper (arms, wrists, hands, fingers): None, normal Lower (legs, knees, ankles, toes): None, normal, Trunk Movements Neck, shoulders, hips: None, normal, Overall Severity Severity of abnormal movements (highest score from questions above): None, normal Incapacitation due to abnormal movements: None, normal Patient's awareness of abnormal movements (rate only patient's report): No Awareness, Dental Status Current problems with teeth and/or dentures?: No Does patient usually wear dentures?: No  CIWA:    COWS:     Musculoskeletal: Strength & Muscle Tone: within normal limits Gait & Station: normal Patient leans: N/A  Psychiatric Specialty Exam:  Presentation  General Appearance:  Disheveled  Eye Contact: Minimal  Speech: Pressured  Speech Volume: Normal  Handedness: Right   Mood and Affect  Mood: Euphoric; Irritable  Affect: Inappropriate   Thought Process  Thought Processes: Disorganized  Descriptions of Associations:Loose  Orientation:Partial  Thought Content:Illogical; Scattered  History of Schizophrenia/Schizoaffective disorder:No data recorded Duration of Psychotic Symptoms:No data recorded Hallucinations:No data recorded Ideas of Reference:Delusions; Paranoia  Suicidal Thoughts:No data recorded Homicidal Thoughts:No data recorded  Sensorium  Memory: Immediate Poor; Recent Poor; Remote Poor  Judgment: Poor  Insight: Poor   Executive Functions  Concentration: Fair  Attention Span: Fair  Recall: Poor  Fund of Knowledge: Poor  Language: Poor   Psychomotor Activity  Psychomotor Activity:No data recorded  Assets  Assets: Desire for Improvement; Social Support   Sleep  Sleep:No data  recorded   Physical Exam: Physical Exam Vitals and nursing note reviewed.  Constitutional:      Appearance: Normal appearance.  HENT:     Head: Normocephalic and atraumatic.     Mouth/Throat:     Pharynx: Oropharynx is clear.  Eyes:     Pupils: Pupils are equal, round, and reactive to light.  Cardiovascular:     Rate and Rhythm: Normal rate and regular rhythm.  Pulmonary:     Effort: Pulmonary effort is normal.     Breath sounds: Normal breath sounds.  Abdominal:     General: Abdomen is flat.     Palpations: Abdomen is soft.  Musculoskeletal:        General: Normal range of motion.  Skin:    General: Skin is warm and dry.  Neurological:     General: No focal deficit present.     Mental Status: He is alert. Mental  status is at baseline.  Psychiatric:        Mood and Affect: Mood normal.        Thought Content: Thought content normal.    Review of Systems  Constitutional: Negative.   HENT: Negative.    Eyes: Negative.   Respiratory: Negative.    Cardiovascular: Negative.   Gastrointestinal: Negative.   Musculoskeletal: Negative.   Skin: Negative.   Neurological: Negative.   Psychiatric/Behavioral: Negative.     Blood pressure 110/85, pulse (!) 108, temperature 98.1 F (36.7 C), temperature source Oral, resp. rate 18, height 5\' 9"  (1.753 m), weight 96.4 kg, SpO2 97 %. Body mass index is 31.38 kg/m.   Treatment Plan Summary: Medication management and Plan no change to medication.  Supportive encouragement.  Continue daily to review patient's situation with treatment team.  Still focused on placement.  Alethia Berthold, MD 05/20/2022, 2:38 PM

## 2022-05-20 NOTE — Progress Notes (Signed)
Pt denies SI/HI/AVH and verbally agrees to approach staff if these become apparent or before harming themselves/others. Rates depression 0/10. Rates anxiety 0/10. Rates pain 0/10. Pt has been in his room for most of the day. Pt seems to talk to his self in his room and has bizarre hand gestures. Scheduled medications administered to pt, per MD orders. RN provided support and encouragement to pt. Q15 min safety checks implemented and continued. Pt safe on the unit. RN will continue to monitor and intervene as needed.  05/20/22 0944  Psych Admission Type (Psych Patients Only)  Admission Status Voluntary  Psychosocial Assessment  Patient Complaints None  Eye Contact Brief  Facial Expression Flat  Affect Appropriate to circumstance  Speech Logical/coherent  Interaction Assertive  Motor Activity Slow  Appearance/Hygiene Unremarkable  Behavior Characteristics Cooperative  Mood Depressed  Aggressive Behavior  Effect No apparent injury  Thought Process  Coherency WDL  Content WDL  Delusions None reported or observed  Perception WDL  Hallucination None reported or observed  Judgment Impaired  Confusion None  Danger to Self  Current suicidal ideation? Denies  Danger to Others  Danger to Others None reported or observed

## 2022-05-20 NOTE — [facility] Group Notes (Signed)
[facility] Group Notes:  (Nursing/MHT/Case Management/Adjunct)  Date:  05/20/2022  Time:  3:10 PM  Type of Therapy:  Psychoeducational Skills  Participation Level:  Did Not Attend   Naythan Douthit Travis Kashden Deboy 05/20/2022, 3:10 PM 

## 2022-05-20 NOTE — Group Note (Signed)
[facility] LCSW Group Therapy Note    Group Date: 05/20/2022 Start Time: 1300 End Time: 1400  Type of Therapy and Topic:  Group Therapy:  Overcoming Obstacles  Participation Level:  [facility] PARTICIPATION LEVEL: Did Not Attend  Mood:  Description of Group:   In this group patients will be encouraged to explore what they see as obstacles to their own wellness and recovery. They will be guided to discuss their thoughts, feelings, and behaviors related to these obstacles. The group will process together ways to cope with barriers, with attention given to specific choices patients can make. Each patient will be challenged to identify changes they are motivated to make in order to overcome their obstacles. This group will be process-oriented, with patients participating in exploration of their own experiences as well as giving and receiving support and challenge from other group members.  Therapeutic Goals: 1. Patient will identify personal and current obstacles as they relate to admission. 2. Patient will identify barriers that currently interfere with their wellness or overcoming obstacles.  3. Patient will identify feelings, thought process and behaviors related to these barriers. 4. Patient will identify two changes they are willing to make to overcome these obstacles:    Summary of Patient Progress   Patient did not attend group despite encouraged participation.    Therapeutic Modalities:   Cognitive Behavioral Therapy Solution Focused Therapy Motivational Interviewing Relapse Prevention Therapy   Aizza Santiago W Damaree Sargent, LCSWA 

## 2022-05-21 DIAGNOSIS — F203 Undifferentiated schizophrenia: Secondary | ICD-10-CM | POA: Diagnosis not present

## 2022-05-21 NOTE — Progress Notes (Signed)
[facility] MD Progress Note  05/21/2022 2:46 PM Travis Sandoval  MRN:  [id number] Subjective: Follow-up 42 year old man with schizophrenia.  No new complaints.  No agitation and no behavior problems tolerating medicine well.  Physically well. Principal Problem: Schizophrenia ([place]) Diagnosis: Principal Problem:   Schizophrenia ([place]) Active Problems:   Psychosis ([place])  Total Time spent with patient: 30 minutes  Past Psychiatric History: Schizophrenia  Past Medical History:  Past Medical History:  Diagnosis Date   ADHD    Bipolar 1 disorder ([place])    Hepatitis C    Schizoaffective disorder ([place])    History reviewed. No pertinent surgical history. Family History:  Family History  Family history unknown: Yes   Family Psychiatric  History: None Social History:  Social History   Substance and Sexual Activity  Alcohol Use Not Currently     Social History   Substance and Sexual Activity  Drug Use Not Currently    Social History   Socioeconomic History   Marital status: Unknown    Spouse name: Not on file   Number of children: Not on file   Years of education: Not on file   Highest education level: Not on file  Occupational History   Not on file  Tobacco Use   Smoking status: Every Day    Packs/day: 0.25    Years: 15.00    Total pack years: 3.75    Types: Cigarettes   Smokeless tobacco: Not on file  Vaping Use   Vaping Use: Not on file  Substance and Sexual Activity   Alcohol use: Not Currently   Drug use: Not Currently   Sexual activity: Not Currently  Other Topics Concern   Not on file  Social History Narrative   Not on file   Social Determinants of Health   Financial Resource Strain: Not on file  Food Insecurity: Unknown (05/18/2022)   Hunger Vital Sign    Worried About Running Out of Food in the Last Year: Patient refused    [place] in the Last Year: Patient refused  Transportation Needs: Unknown (05/18/2022)   [place] - Radiographer, therapeutic (Medical): Patient refused    Lack of Transportation (Non-Medical): Patient refused  Physical Activity: Not on file  Stress: Not on file  Social Connections: Not on file   Additional Social History:                         Sleep: Fair  Appetite:  Fair  Current Medications: Current Facility-Administered Medications  Medication Dose Route Frequency Provider Last Rate Last Admin   acetaminophen (TYLENOL) tablet 650 mg  650 mg Oral Q6H PRN [name], NP       alum & mag hydroxide-simeth (MAALOX/MYLANTA) 200-200-20 MG/5ML suspension 30 mL  30 mL Oral Q4H PRN [name], NP   30 mL at 05/18/22 2257   cloZAPine (CLOZARIL) tablet 250 mg  250 mg Oral QHS Franshesca Chipman T, MD   250 mg at 05/20/22 2130   hydrOXYzine (ATARAX) tablet 50 mg  50 mg Oral Q6H PRN Zaiden Ludlum T, MD   50 mg at 05/17/22 0944   magnesium hydroxide (MILK OF MAGNESIA) suspension 30 mL  30 mL Oral Daily PRN [name], NP       nicotine polacrilex (NICORETTE) gum 2 mg  2 mg Oral PRN Irbin Fines, [name], MD   2 mg at 04/23/22 2200   [START ON 05/30/2022]  paliperidone (INVEGA SUSTENNA) injection 156 mg  156 mg Intramuscular Q28 days Aleksi Brummet T, MD       ziprasidone (GEODON) injection 20 mg  20 mg Intramuscular Q12H PRN [name], MD        Lab Results: No results found for this or any previous visit (from the past 56 hour(s)).  Blood Alcohol level:  Lab Results  Component Value Date   ETH <10 51/05/5850    Metabolic Disorder Labs: Lab Results  Component Value Date   HGBA1C 5.5 04/24/2022   MPG 111 04/24/2022   No results found for: "PROLACTIN" Lab Results  Component Value Date   CHOL 220 (H) 04/24/2022   TRIG 700 (H) 04/24/2022   HDL 35 (L) 04/24/2022   CHOLHDL 6.3 04/24/2022   VLDL UNABLE TO CALCULATE IF TRIGLYCERIDE OVER 400 mg/dL 04/24/2022   LDLCALC UNABLE TO CALCULATE IF TRIGLYCERIDE OVER 400 mg/dL 04/24/2022    Physical Findings: AIMS: Facial and  Oral Movements Muscles of Facial Expression: None, normal Lips and Perioral Area: None, normal Jaw: None, normal Tongue: None, normal,Extremity Movements Upper (arms, wrists, hands, fingers): None, normal Lower (legs, knees, ankles, toes): None, normal, Trunk Movements Neck, shoulders, hips: None, normal, Overall Severity Severity of abnormal movements (highest score from questions above): None, normal Incapacitation due to abnormal movements: None, normal Patient's awareness of abnormal movements (rate only patient's report): No Awareness, Dental Status Current problems with teeth and/or dentures?: No Does patient usually wear dentures?: No  CIWA:    COWS:     Musculoskeletal: Strength & Muscle Tone: within normal limits Gait & Station: normal Patient leans: N/A  Psychiatric Specialty Exam:  Presentation  General Appearance:  Disheveled  Eye Contact: Minimal  Speech: Pressured  Speech Volume: Normal  Handedness: Right   Mood and Affect  Mood: Euphoric; Irritable  Affect: Inappropriate   Thought Process  Thought Processes: Disorganized  Descriptions of Associations:Loose  Orientation:Partial  Thought Content:Illogical; Scattered  History of Schizophrenia/Schizoaffective disorder:No data recorded Duration of Psychotic Symptoms:No data recorded Hallucinations:No data recorded Ideas of Reference:Delusions; Paranoia  Suicidal Thoughts:No data recorded Homicidal Thoughts:No data recorded  Sensorium  Memory: Immediate Poor; Recent Poor; Remote Poor  Judgment: Poor  Insight: Poor   Executive Functions  Concentration: Fair  Attention Span: Fair  Recall: Poor  Fund of Knowledge: Poor  Language: Poor   Psychomotor Activity  Psychomotor Activity:No data recorded  Assets  Assets: Desire for Improvement; Social Support   Sleep  Sleep:No data recorded   Physical Exam: Physical Exam Vitals and nursing note reviewed.   Constitutional:      Appearance: Normal appearance.  HENT:     Head: Normocephalic and atraumatic.     Mouth/Throat:     Pharynx: Oropharynx is clear.  Eyes:     Pupils: Pupils are equal, round, and reactive to light.  Cardiovascular:     Rate and Rhythm: Normal rate and regular rhythm.  Pulmonary:     Effort: Pulmonary effort is normal.     Breath sounds: Normal breath sounds.  Abdominal:     General: Abdomen is flat.     Palpations: Abdomen is soft.  Musculoskeletal:        General: Normal range of motion.  Skin:    General: Skin is warm and dry.  Neurological:     General: No focal deficit present.     Mental Status: He is alert. Mental status is at baseline.  Psychiatric:        Attention and Perception:  Attention normal.        Mood and Affect: Mood normal.        Speech: Speech normal.        Behavior: Behavior is withdrawn.        Thought Content: Thought content normal.        Cognition and Memory: Cognition normal.    Review of Systems  Constitutional: Negative.   HENT: Negative.    Eyes: Negative.   Respiratory: Negative.    Cardiovascular: Negative.   Gastrointestinal: Negative.   Musculoskeletal: Negative.   Skin: Negative.   Neurological: Negative.   Psychiatric/Behavioral: Negative.     Blood pressure 122/79, pulse (!) 107, temperature 98.5 F (36.9 C), temperature source Oral, resp. rate 18, height 5\' 9"  (1.753 m), weight 96.4 kg, SpO2 97 %. Body mass index is 31.38 kg/m.   Treatment Plan Summary: Medication management and Plan stable and primarily in need of disposition  Alethia Berthold, MD 05/21/2022, 2:46 PM

## 2022-05-21 NOTE — [facility] Group Notes (Signed)
[place] Group Notes:  (Nursing/MHT/Case Management/Adjunct)  Date:  05/21/2022  Time:  11:17 AM  Type of Therapy:   community meeting  Participation Level:  Did Not Attend    Antonieta Pert 05/21/2022, 11:17 AM

## 2022-05-21 NOTE — Group Note (Signed)
[facility] LCSW Group Therapy Note   Group Date: 05/21/2022 Start Time: 1300 End Time: 1400  Type of Therapy/Topic:  Group Therapy:  Feelings about Diagnosis  Participation Level:  Did Not Attend    Description of Group:    This group will allow patients to explore their thoughts and feelings about diagnoses they have received. Patients will be guided to explore their level of understanding and acceptance of these diagnoses. Facilitator will encourage patients to process their thoughts and feelings about the reactions of others to their diagnosis, and will guide patients in identifying ways to discuss their diagnosis with significant others in their lives. This group will be process-oriented, with patients participating in exploration of their own experiences as well as giving and receiving support and challenge from other group members.   Therapeutic Goals: 1. Patient will demonstrate understanding of diagnosis as evidence by identifying two or more symptoms of the disorder:  2. Patient will be able to express two feelings regarding the diagnosis 3. Patient will demonstrate ability to communicate their needs through discussion and/or role plays  Summary of Patient Progress: X   Therapeutic Modalities:   Cognitive Behavioral Therapy Brief Therapy Feelings Identification    Maryama Kuriakose R Leotha Voeltz, LCSW 

## 2022-05-21 NOTE — Progress Notes (Signed)
Patient calm and pleasant during assessment denying SI/HI/AVH. Pt observed interacting appropriately with staff and peers on the unit. Pt compliant with medication administration per MD orders. Pt given education, support, and encouragement to be acitve in his treatment plan. Pt being monitored Q 15 minutes for safety per unit protocol, remains safe on the uni

## 2022-05-21 NOTE — Progress Notes (Signed)
D- Patient alert and oriented x 2-3 Affect flat/mood flat. Denies SI/ HI/AVH. Denies pain. States "I am ready to leave; waiting for someplace to go". A- PRN Mylanta requested prior to supper for prevention of "heartburn" per MD orders. Support and encouragement provided.  Routine safety checks conducted every 15 minutes.  Patient informed to notify staff with problems or concerns. R- No adverse drug or Clozaril side effects. Patient compliant with medications and treatment plan. Patient receptive, calm, and cooperative. Patient spends most of his time in his room except for meals.  Patient remains safe on the unit at this time.

## 2022-05-21 NOTE — Progress Notes (Signed)
Pt. Remains somewhat solitary and minimally interactive with peers.  Pleasant upon approach. Denies somatic complaints.  Denies SI/HI/AV hallucinations although appears internally preoccupied. Mood is labile alternating between pleasant and irritable. To bed within normal parameters.  Continue to monitor.

## 2022-05-21 NOTE — Plan of Care (Signed)
  Problem: Education: Goal: Knowledge of General Education information will improve Description: Including pain rating scale, medication(s)/side effects and non-pharmacologic comfort measures Outcome: Progressing   Problem: Health Behavior/Discharge Planning: Goal: Ability to manage health-related needs will improve Outcome: Progressing   Problem: Clinical Measurements: Goal: Ability to maintain clinical measurements within normal limits will improve Outcome: Progressing Goal: Will remain free from infection Outcome: Progressing Goal: Diagnostic test results will improve Outcome: Progressing Goal: Respiratory complications will improve Outcome: Progressing Goal: Cardiovascular complication will be avoided Outcome: Progressing   Problem: Activity: Goal: Risk for activity intolerance will decrease Outcome: Progressing   Problem: Nutrition: Goal: Adequate nutrition will be maintained Outcome: Progressing   Problem: Coping: Goal: Level of anxiety will decrease Outcome: Progressing   Problem: Elimination: Goal: Will not experience complications related to urinary retention Outcome: Progressing   Problem: Pain Managment: Goal: General experience of comfort will improve Outcome: Progressing   Problem: Safety: Goal: Ability to remain free from injury will improve Outcome: Progressing   Problem: Skin Integrity: Goal: Risk for impaired skin integrity will decrease Outcome: Progressing   Problem: Education: Goal: Knowledge of  General Education information/materials will improve Outcome: Progressing Goal: Emotional status will improve Outcome: Progressing Goal: Mental status will improve Outcome: Progressing Goal: Verbalization of understanding the information provided will improve Outcome: Progressing   Problem: Activity: Goal: Interest or engagement in activities will improve Outcome: Progressing Goal: Sleeping patterns will improve Outcome:  Progressing   Problem: Coping: Goal: Ability to verbalize frustrations and anger appropriately will improve Outcome: Progressing Goal: Ability to demonstrate self-control will improve Outcome: Progressing   Problem: Health Behavior/Discharge Planning: Goal: Identification of resources available to assist in meeting health care needs will improve Outcome: Progressing Goal: Compliance with treatment plan for underlying cause of condition will improve Outcome: Progressing   Problem: Physical Regulation: Goal: Ability to maintain clinical measurements within normal limits will improve Outcome: Progressing   Problem: Safety: Goal: Periods of time without injury will increase Outcome: Progressing   Problem: Activity: Goal: Will verbalize the importance of balancing activity with adequate rest periods Outcome: Progressing   Problem: Education: Goal: Will be free of psychotic symptoms Outcome: Progressing Goal: Knowledge of the prescribed therapeutic regimen will improve Outcome: Progressing   Problem: Coping: Goal: Coping ability will improve Outcome: Progressing Goal: Will verbalize feelings Outcome: Progressing   Problem: Health Behavior/Discharge Planning: Goal: Compliance with prescribed medication regimen will improve Outcome: Progressing   Problem: Nutritional: Goal: Ability to achieve adequate nutritional intake will improve Outcome: Progressing   Problem: Role Relationship: Goal: Ability to communicate needs accurately will improve Outcome: Progressing Goal: Ability to interact with others will improve Outcome: Progressing   Problem: Safety: Goal: Ability to redirect hostility and anger into socially appropriate behaviors will improve Outcome: Progressing Goal: Ability to remain free from injury will improve Outcome: Progressing   Problem: Self-Care: Goal: Ability to participate in self-care as condition permits will improve Outcome: Progressing    Problem: Self-Concept: Goal: Will verbalize positive feelings about self Outcome: Progressing   

## 2022-05-21 NOTE — [facility] Group Notes (Signed)
[place] Group Notes:  (Nursing/MHT/Case Management/Adjunct)  Date:  05/21/2022  Time:  3:35 PM  Type of Therapy:  Psychoeducational Skills  Participation Level:  Did Not Attend   Adela Lank [facility] 05/21/2022, 3:35 PM

## 2022-05-22 DIAGNOSIS — F203 Undifferentiated schizophrenia: Secondary | ICD-10-CM | POA: Diagnosis not present

## 2022-05-22 LAB — CBC WITH DIFFERENTIAL/PLATELET
Abs Immature Granulocytes: 0.03 10*3/uL (ref 0.00–0.07)
Basophils Absolute: 0.1 10*3/uL (ref 0.0–0.1)
Basophils Relative: 1 %
Eosinophils Absolute: 0.1 10*3/uL (ref 0.0–0.5)
Eosinophils Relative: 2 %
HCT: 41.4 % (ref 39.0–52.0)
Hemoglobin: 14 g/dL (ref 13.0–17.0)
Immature Granulocytes: 0 %
Lymphocytes Relative: 34 %
Lymphs Abs: 2.5 10*3/uL (ref 0.7–4.0)
MCH: 30.6 pg (ref 26.0–34.0)
MCHC: 33.8 g/dL (ref 30.0–36.0)
MCV: 90.6 fL (ref 80.0–100.0)
Monocytes Absolute: 0.4 10*3/uL (ref 0.1–1.0)
Monocytes Relative: 5 %
Neutro Abs: 4.4 10*3/uL (ref 1.7–7.7)
Neutrophils Relative %: 58 %
Platelets: 208 10*3/uL (ref 150–400)
RBC: 4.57 MIL/uL (ref 4.22–5.81)
RDW: 12.3 % (ref 11.5–15.5)
WBC: 7.5 10*3/uL (ref 4.0–10.5)
nRBC: 0 % (ref 0.0–0.2)

## 2022-05-22 MED ORDER — FAMOTIDINE 20 MG PO TABS
20.0000 mg | ORAL_TABLET | Freq: Two times a day (BID) | ORAL | Status: DC
  Administered 2022-05-22 – 2022-07-20 (×96): 20 mg via ORAL
  Filled 2022-05-22 (×118): qty 1

## 2022-05-22 NOTE — Progress Notes (Signed)
[facility] MD Progress Note  05/22/2022 11:25 AM Travis Sandoval  MRN:  [id number] Subjective: Patient seen and chart reviewed.  Patient has no new complaints.  Behavior remains the same as it has been.  No dangerousness no aggression.  Compliant with medicine. Principal Problem: Schizophrenia ([place]) Diagnosis: Principal Problem:   Schizophrenia ([place]) Active Problems:   Psychosis ([place])  Total Time spent with patient: 30 minutes  Past Psychiatric History: Past history of schizophrenia  Past Medical History:  Past Medical History:  Diagnosis Date   ADHD    Bipolar 1 disorder ([place])    Hepatitis C    Schizoaffective disorder ([place])    History reviewed. No pertinent surgical history. Family History:  Family History  Family history unknown: Yes   Family Psychiatric  History: See previous Social History:  Social History   Substance and Sexual Activity  Alcohol Use Not Currently     Social History   Substance and Sexual Activity  Drug Use Not Currently    Social History   Socioeconomic History   Marital status: Unknown    Spouse name: Not on file   Number of children: Not on file   Years of education: Not on file   Highest education level: Not on file  Occupational History   Not on file  Tobacco Use   Smoking status: Every Day    Packs/day: 0.25    Years: 15.00    Total pack years: 3.75    Types: Cigarettes   Smokeless tobacco: Not on file  Vaping Use   Vaping Use: Not on file  Substance and Sexual Activity   Alcohol use: Not Currently   Drug use: Not Currently   Sexual activity: Not Currently  Other Topics Concern   Not on file  Social History Narrative   Not on file   Social Determinants of Health   Financial Resource Strain: Not on file  Food Insecurity: Unknown (05/18/2022)   Hunger Vital Sign    Worried About Running Out of Food in the Last Year: Patient refused    [place] in the Last Year: Patient refused  Transportation Needs: Unknown (05/18/2022)    [place] - Hydrologist (Medical): Patient refused    Lack of Transportation (Non-Medical): Patient refused  Physical Activity: Not on file  Stress: Not on file  Social Connections: Not on file   Additional Social History:                         Sleep: Fair  Appetite:  Fair  Current Medications: Current Facility-Administered Medications  Medication Dose Route Frequency Provider Last Rate Last Admin   acetaminophen (TYLENOL) tablet 650 mg  650 mg Oral Q6H PRN [name], NP       alum & mag hydroxide-simeth (MAALOX/MYLANTA) 200-200-20 MG/5ML suspension 30 mL  30 mL Oral Q4H PRN [name], NP   30 mL at 05/22/22 0816   cloZAPine (CLOZARIL) tablet 250 mg  250 mg Oral QHS Brazen Domangue T, MD   250 mg at 05/21/22 2058   hydrOXYzine (ATARAX) tablet 50 mg  50 mg Oral Q6H PRN Nyaisha Simao T, MD   50 mg at 05/17/22 0944   magnesium hydroxide (MILK OF MAGNESIA) suspension 30 mL  30 mL Oral Daily PRN [name], NP       nicotine polacrilex (NICORETTE) gum 2 mg  2 mg Oral PRN Khayman Kirsch, [name], MD  2 mg at 04/23/22 2200   [START ON 05/30/2022] paliperidone (INVEGA SUSTENNA) injection 156 mg  156 mg Intramuscular Q28 days Tattiana Fakhouri, [name], MD       ziprasidone (GEODON) injection 20 mg  20 mg Intramuscular Q12H PRN [name], MD        Lab Results:  Results for orders placed or performed during the hospital encounter of 04/16/22 (from the past 48 hour(s))  CBC with Differential/Platelet     Status: None   Collection Time: 05/22/22  9:29 AM  Result Value Ref Range   WBC 7.5 4.0 - 10.5 K/uL   RBC 4.57 4.22 - 5.81 MIL/uL   Hemoglobin 14.0 13.0 - 17.0 g/dL   HCT 41.4 39.0 - 52.0 %   MCV 90.6 80.0 - 100.0 fL   MCH 30.6 26.0 - 34.0 pg   MCHC 33.8 30.0 - 36.0 g/dL   RDW 12.3 11.5 - 15.5 %   Platelets 208 150 - 400 K/uL   nRBC 0.0 0.0 - 0.2 %   Neutrophils Relative % 58 %   Neutro Abs 4.4 1.7 - 7.7 K/uL   Lymphocytes Relative 34  %   Lymphs Abs 2.5 0.7 - 4.0 K/uL   Monocytes Relative 5 %   Monocytes Absolute 0.4 0.1 - 1.0 K/uL   Eosinophils Relative 2 %   Eosinophils Absolute 0.1 0.0 - 0.5 K/uL   Basophils Relative 1 %   Basophils Absolute 0.1 0.0 - 0.1 K/uL   Immature Granulocytes 0 %   Abs Immature Granulocytes 0.03 0.00 - 0.07 K/uL    Comment: Performed at [facility], [address] [place], [place] [address]    Blood Alcohol level:  Lab Results  Component Value Date   [facility] <10 40/01/2724    Metabolic Disorder Labs: Lab Results  Component Value Date   HGBA1C 5.5 04/24/2022   MPG 111 04/24/2022   No results found for: "PROLACTIN" Lab Results  Component Value Date   CHOL 220 (H) 04/24/2022   TRIG 700 (H) 04/24/2022   HDL 35 (L) 04/24/2022   CHOLHDL 6.3 04/24/2022   VLDL UNABLE TO CALCULATE IF TRIGLYCERIDE OVER 400 mg/dL 04/24/2022   LDLCALC UNABLE TO CALCULATE IF TRIGLYCERIDE OVER 400 mg/dL 04/24/2022    Physical Findings: AIMS: Facial and Oral Movements Muscles of Facial Expression: None, normal Lips and Perioral Area: None, normal Jaw: None, normal Tongue: None, normal,Extremity Movements Upper (arms, wrists, hands, fingers): None, normal Lower (legs, knees, ankles, toes): None, normal, Trunk Movements Neck, shoulders, hips: None, normal, Overall Severity Severity of abnormal movements (highest score from questions above): None, normal Incapacitation due to abnormal movements: None, normal Patient's awareness of abnormal movements (rate only patient's report): No Awareness, Dental Status Current problems with teeth and/or dentures?: No Does patient usually wear dentures?: No  CIWA:    COWS:     Musculoskeletal: Strength & Muscle Tone: within normal limits Gait & Station: normal Patient leans: N/A  Psychiatric Specialty Exam:  Presentation  General Appearance:  Disheveled  Eye Contact: Minimal  Speech: Pressured  Speech  Volume: Normal  Handedness: Right   Mood and Affect  Mood: Euphoric; Irritable  Affect: Inappropriate   Thought Process  Thought Processes: Disorganized  Descriptions of Associations:Loose  Orientation:Partial  Thought Content:Illogical; Scattered  History of Schizophrenia/Schizoaffective disorder:No data recorded Duration of Psychotic Symptoms:No data recorded Hallucinations:No data recorded Ideas of Reference:Delusions; Paranoia  Suicidal Thoughts:No data recorded Homicidal Thoughts:No data recorded  Sensorium  Memory: Immediate Poor; Recent Poor; Remote  Poor  Judgment: Poor  Insight: Poor   Community education officer  Concentration: Fair  Attention Span: Fair  Recall: Poor  Fund of Knowledge: Poor  Language: Poor   Psychomotor Activity  Psychomotor Activity:No data recorded  Assets  Assets: Desire for Improvement; Social Support   Sleep  Sleep:No data recorded   Physical Exam: Physical Exam Vitals and nursing note reviewed.  Constitutional:      Appearance: Normal appearance.  HENT:     Head: Normocephalic and atraumatic.     Mouth/Throat:     Pharynx: Oropharynx is clear.  Eyes:     Pupils: Pupils are equal, round, and reactive to light.  Cardiovascular:     Rate and Rhythm: Normal rate and regular rhythm.  Pulmonary:     Effort: Pulmonary effort is normal.     Breath sounds: Normal breath sounds.  Abdominal:     General: Abdomen is flat.     Palpations: Abdomen is soft.  Musculoskeletal:        General: Normal range of motion.  Skin:    General: Skin is warm and dry.  Neurological:     General: No focal deficit present.     Mental Status: He is alert. Mental status is at baseline.  Psychiatric:        Attention and Perception: Attention normal.        Mood and Affect: Mood normal.        Speech: Speech normal.        Behavior: Behavior is withdrawn.        Thought Content: Thought content normal.        Cognition  and Memory: Cognition normal.    Review of Systems  Constitutional: Negative.   HENT: Negative.    Eyes: Negative.   Respiratory: Negative.    Cardiovascular: Negative.   Gastrointestinal: Negative.   Musculoskeletal: Negative.   Skin: Negative.   Neurological: Negative.   Psychiatric/Behavioral: Negative.     Blood pressure 110/87, pulse (!) 110, temperature 98.7 F (37.1 C), temperature source Oral, resp. rate 16, height 5\' 9"  (1.753 m), weight 96.4 kg, SpO2 98 %. Body mass index is 31.38 kg/m.   Treatment Plan Summary: Medication management and Plan patient is entirely ready for discharge and just pending the financing and finding a place for him to go.  No change to treatment  Alethia Berthold, MD 05/22/2022, 11:25 AM

## 2022-05-22 NOTE — Progress Notes (Signed)
Patient calm and pleasant during assessment denying SI/HI/AVH. Pt observed interacting appropriately with staff and peers on the unit. Pt compliant with medication administration per MD orders. Pt given education, support, and encouragement to be acitve in his treatment plan. Pt being monitored Q 15 minutes for safety per unit protocol, remains safe on the unit    

## 2022-05-22 NOTE — [facility] Group Notes (Signed)
[place] Group Notes:  (Nursing/MHT/Case Management/Adjunct)  Date:  05/22/2022  Time:  8:38 PM  Type of Therapy:   Wrap up  Participation Level:  Did Not Attend  Summary of Progress/Problems:  Travis Sandoval 05/22/2022, 8:38 PM

## 2022-05-22 NOTE — Group Note (Signed)
LCSW Group Therapy Note  Group Date: 05/22/2022 Start Time: 1300 End Time: 1400   Type of Therapy and Topic:  Group Therapy - How To Cope with Nervousness about Discharge   Participation Level:  Did Not Attend   Description of Group This process group involved identification of patients' feelings about discharge. Some of them are scheduled to be discharged soon, while others are new admissions, but each of them was asked to share thoughts and feelings surrounding discharge from the hospital. One common theme was that they are excited at the prospect of going home, while another was that many of them are apprehensive about sharing why they were hospitalized. Patients were given the opportunity to discuss these feelings with their peers in preparation for discharge.  Therapeutic Goals  Patient will identify their overall feelings about pending discharge. Patient will think about how they might proactively address issues that they believe will once again arise once they get home (i.e. with parents). Patients will participate in discussion about having hope for change.   Summary of Patient Progress:  Patient did not attend group despite encouraged participation.    Therapeutic Modalities Cognitive Behavioral Therapy   Paitlyn Mcclatchey W Korin Hartwell, LCSWA 05/22/2022  2:23 PM   

## 2022-05-22 NOTE — Plan of Care (Signed)
D- Patient alert and oriented. Patient presented in a pleasant mood on assessment reporting that he slept good last night and had some complaints of heartburn to voice to this writer, in which he requested PRN medication for relief. Patient denied SI, HI, AVH, and pain at this time. Patient also denied any signs/symptoms of depression and anxiety, stating that overall, he is feeling "pretty good". Patient had no stated goals for today.  A- Scheduled medications administered to patient, per MD orders. Support and encouragement provided.  Routine safety checks conducted every 15 minutes.  Patient informed to notify staff with problems or concerns.  R- No adverse drug reactions noted. Patient contracts for safety at this time. Patient compliant with medications and treatment plan. Patient receptive, calm, and cooperative. Patient isolates to room, except for meals and medication. Patient remains safe at this time.  Problem: Education: Goal: Knowledge of General Education information will improve Description: Including pain rating scale, medication(s)/side effects and non-pharmacologic comfort measures Outcome: Progressing   Problem: Health Behavior/Discharge Planning: Goal: Ability to manage health-related needs will improve Outcome: Progressing   Problem: Clinical Measurements: Goal: Ability to maintain clinical measurements within normal limits will improve Outcome: Progressing Goal: Will remain free from infection Outcome: Progressing Goal: Diagnostic test results will improve Outcome: Progressing Goal: Respiratory complications will improve Outcome: Progressing Goal: Cardiovascular complication will be avoided Outcome: Progressing   Problem: Activity: Goal: Risk for activity intolerance will decrease Outcome: Progressing   Problem: Nutrition: Goal: Adequate nutrition will be maintained Outcome: Progressing   Problem: Coping: Goal: Level of anxiety will decrease Outcome:  Progressing   Problem: Elimination: Goal: Will not experience complications related to urinary retention Outcome: Progressing   Problem: Pain Managment: Goal: General experience of comfort will improve Outcome: Progressing   Problem: Safety: Goal: Ability to remain free from injury will improve Outcome: Progressing   Problem: Skin Integrity: Goal: Risk for impaired skin integrity will decrease Outcome: Progressing   Problem: Education: Goal: Knowledge of [place] [facility] Education information/materials will improve Outcome: Progressing Goal: Emotional status will improve Outcome: Progressing Goal: Mental status will improve Outcome: Progressing Goal: Verbalization of understanding the information provided will improve Outcome: Progressing   Problem: Activity: Goal: Interest or engagement in activities will improve Outcome: Progressing Goal: Sleeping patterns will improve Outcome: Progressing   Problem: Coping: Goal: Ability to verbalize frustrations and anger appropriately will improve Outcome: Progressing Goal: Ability to demonstrate self-control will improve Outcome: Progressing   Problem: Health Behavior/Discharge Planning: Goal: Identification of resources available to assist in meeting health care needs will improve Outcome: Progressing Goal: Compliance with treatment plan for underlying cause of condition will improve Outcome: Progressing   Problem: Physical Regulation: Goal: Ability to maintain clinical measurements within normal limits will improve Outcome: Progressing   Problem: Safety: Goal: Periods of time without injury will increase Outcome: Progressing   Problem: Activity: Goal: Will verbalize the importance of balancing activity with adequate rest periods Outcome: Progressing   Problem: Education: Goal: Will be free of psychotic symptoms Outcome: Progressing Goal: Knowledge of the prescribed therapeutic regimen will improve Outcome:  Progressing   Problem: Coping: Goal: Coping ability will improve Outcome: Progressing Goal: Will verbalize feelings Outcome: Progressing   Problem: Health Behavior/Discharge Planning: Goal: Compliance with prescribed medication regimen will improve Outcome: Progressing   Problem: Nutritional: Goal: Ability to achieve adequate nutritional intake will improve Outcome: Progressing   Problem: Role Relationship: Goal: Ability to communicate needs accurately will improve Outcome: Progressing Goal: Ability to interact with others will  improve Outcome: Progressing   Problem: Safety: Goal: Ability to redirect hostility and anger into socially appropriate behaviors will improve Outcome: Progressing Goal: Ability to remain free from injury will improve Outcome: Progressing   Problem: Self-Care: Goal: Ability to participate in self-care as condition permits will improve Outcome: Progressing   Problem: Self-Concept: Goal: Will verbalize positive feelings about self Outcome: Progressing

## 2022-05-22 NOTE — BH IP Treatment Plan (Signed)
Interdisciplinary Treatment and Diagnostic Plan Update  05/22/2022 Time of Session: 8:30AM Travis Sandoval MRN: [id number]  Principal Diagnosis: Schizophrenia [facility])  Secondary Diagnoses: Principal Problem:   Schizophrenia ([place]) Active Problems:   Psychosis ([place])   Current Medications:  Current Facility-Administered Medications  Medication Dose Route Frequency Provider Last Rate Last Admin   acetaminophen (TYLENOL) tablet 650 mg  650 mg Oral Q6H PRN [name], NP       alum & mag hydroxide-simeth (MAALOX/MYLANTA) 200-200-20 MG/5ML suspension 30 mL  30 mL Oral Q4H PRN [name], NP   30 mL at 05/22/22 0816   cloZAPine (CLOZARIL) tablet 250 mg  250 mg Oral QHS [name], [name], MD   250 mg at 05/21/22 2058   hydrOXYzine (ATARAX) tablet 50 mg  50 mg Oral Q6H PRN [name], [name], MD   50 mg at 05/17/22 0944   magnesium hydroxide (MILK OF MAGNESIA) suspension 30 mL  30 mL Oral Daily PRN [name], NP       nicotine polacrilex (NICORETTE) gum 2 mg  2 mg Oral PRN [name], [name], MD   2 mg at 04/23/22 2200   [START ON 05/30/2022] paliperidone (INVEGA SUSTENNA) injection 156 mg  156 mg Intramuscular Q28 days [name], [name], MD       ziprasidone (GEODON) injection 20 mg  20 mg Intramuscular Q12H PRN [name], MD       PTA Medications: Medications Prior to Admission  Medication Sig Dispense Refill Last Dose   ABILIFY MAINTENA 400 MG SRER injection Inject 400 mg into the muscle every 28 (twenty-eight) days.      ARIPiprazole (ABILIFY) 10 MG tablet Take 10 mg by mouth daily. (Patient not taking: Reported on 04/16/2022)      atomoxetine (STRATTERA) 40 MG capsule Take 40 mg by mouth daily. (Patient not taking: Reported on 04/16/2022)      atorvastatin (LIPITOR) 40 MG tablet Take 40 mg by mouth daily.      budesonide-formoterol (SYMBICORT) 160-4.5 MCG/ACT inhaler Inhale 2 puffs into the lungs.      cetirizine (ZYRTEC) 10 MG tablet Take 10 mg by mouth daily.       clozapine (CLOZARIL) 200 MG tablet Take 200 mg by mouth 2 (two) times daily. Take along with one 25 mg tablet for total 225 mg twice daily      cloZAPine (CLOZARIL) 25 MG tablet Take 25 mg by mouth 2 (two) times daily. Take along with one 200 mg tablet for total 225 mg twice daily      Dexlansoprazole (DEXILANT) 30 MG capsule Take 30 mg by mouth daily. (Patient not taking: Reported on 04/16/2022)      hydrOXYzine (VISTARIL) 50 MG capsule Take 50 mg by mouth 3 (three) times daily as needed for itching. (Patient not taking: Reported on 04/16/2022)      Melatonin 5 MG TABS Take 5 mg by mouth at bedtime as needed (sleep).      metoprolol succinate (TOPROL-XL) 25 MG 24 hr tablet Take 12.5 mg by mouth daily.      Phosphatidyl Choline 65 MG TABS Take 1 tablet by mouth daily.  (Patient not taking: Reported on 04/16/2022)      valproic acid (DEPAKENE) 250 MG/5ML solution Take 750-1,000 mLs by mouth 2 (two) times daily. 1000 mg (20 mL) every morning and 750 mg (15 mL) daily at bedtime       Patient Stressors: Other: Psychosis    Patient Strengths: Ability for insight  Active  sense of humor  Average or above average intelligence  Capable of independent living  Supportive family/friends   Treatment Modalities: Medication Management, Group therapy, Case management,  1 to 1 session with clinician, Psychoeducation, Recreational therapy.   Physician Treatment Plan for Primary Diagnosis: Schizophrenia ([place]) Long Term Goal(s):     Short Term Goals: None, pt is a poor hx.  Medication Management: Evaluate patient's response, side effects, and tolerance of medication regimen.  Therapeutic Interventions: 1 to 1 sessions, Unit Group sessions and Medication administration.  Evaluation of Outcomes: Progressing  Physician Treatment Plan for Secondary Diagnosis: Principal Problem:   Schizophrenia ([place]) Active Problems:   Psychosis ([place])  Long Term Goal(s):     Short Term Goals: None, pt is a poor hx.      Medication Management: Evaluate patient's response, side effects, and tolerance of medication regimen.  Therapeutic Interventions: 1 to 1 sessions, Unit Group sessions and Medication administration.  Evaluation of Outcomes: Progressing   RN Treatment Plan for Primary Diagnosis: Schizophrenia ([place]) Long Term Goal(s): Knowledge of disease and therapeutic regimen to maintain health will improve  Short Term Goals: Ability to demonstrate self-control, Ability to participate in decision making will improve, Ability to verbalize feelings will improve, Ability to disclose and discuss suicidal ideas, Ability to identify and develop effective coping behaviors will improve, and Compliance with prescribed medications will improve  Medication Management: RN will administer medications as ordered by provider, will assess and evaluate patient's response and provide education to patient for prescribed medication. RN will report any adverse and/or side effects to prescribing provider.  Therapeutic Interventions: 1 on 1 counseling sessions, Psychoeducation, Medication administration, Evaluate responses to treatment, Monitor vital signs and CBGs as ordered, Perform/monitor CIWA, COWS, AIMS and Fall Risk screenings as ordered, Perform wound care treatments as ordered.  Evaluation of Outcomes: Progressing   LCSW Treatment Plan for Primary Diagnosis: Schizophrenia ([place]) Long Term Goal(s): Safe transition to appropriate next level of care at discharge, Engage patient in therapeutic group addressing interpersonal concerns.  Short Term Goals: Engage patient in aftercare planning with referrals and resources, Increase social support, Increase ability to appropriately verbalize feelings, Increase emotional regulation, Facilitate acceptance of mental health diagnosis and concerns, and Increase skills for wellness and recovery  Therapeutic Interventions: Assess for all discharge needs, 1 to 1 time with Social worker,  Explore available resources and support systems, Assess for adequacy in community support network, Educate family and significant other(s) on suicide prevention, Complete Psychosocial Assessment, Interpersonal group therapy.  Evaluation of Outcomes: Progressing   Progress in Treatment: Attending groups: No. Participating in groups: No. Taking medication as prescribed: Yes. Toleration medication: Yes. Family/Significant other contact made: Yes, individual(s) contacted:  SPE completed with the patient's father. Patient understands diagnosis: No. Discussing patient identified problems/goals with staff: Yes. Medical problems stabilized or resolved: Yes. Denies suicidal/homicidal ideation: Yes. Issues/concerns per patient self-inventory: No. Other: none  New problem(s) identified: No, Describe:  none Update 04/27/2022: No changes at this time.  Update 05/02/2022:   No changes at this time. Update 05/07/22: No changes at this time.  Update 05/12/22: No changes at this time. Update 05/17/2022: none Update 05/22/2022:  No changes at this time.    New Short Term/Long Term Goal(s): Patient to work towards elimination of symptoms of psychosis, medication management for mood stabilization; elimination of SI thoughts; development of comprehensive mental wellness plan. Update 04/27/2022: No changes at this time.  Update 05/02/2022:   No changes at this time. Update 05/07/22: No changes at  this time. Update 05/12/22: No changes at this time. Update 05/17/2022: Patient to work towards elimination of symptoms of psychosis, medication management for mood stabilization; development of comprehensive mental wellness plan.  Update 05/22/2022:  No changes at this time.    Patient Goals:  No additional goals identified at this time. Patient to continue to work towards original goals identified in initial treatment team meeting. CSW will remain available to patient should they voice additional treatment goals. Update 04/27/2022: No  changes at this time.  Update 05/02/2022:   No changes at this time. Update 05/07/22: No changes at this time. Update 05/12/22: No changes at this time.  Update 05/17/2022: No additional goals identified at this time. Patient to continue to work towards original goals identified in initial treatment team meeting. CSW will remain available to patient should they voice additional treatment goals.   Update 05/22/2022:  No changes at this time.    Discharge Plan or Barriers: No psychosocial barriers identified at this time, patient to return to place of residence when appropriate for discharge. Update 04/27/2022: No changes at this time.  Update 05/02/2022:   Treatment team has discovered that the patient is not able to return to his group home and is in need of placement.  Meeting was held with treatment team and [facility] to assist patient in identifying appropriate housing. Update 05/07/22: No changes at this time. Update 05/12/22: No changes at this time.  Update 05/17/2022: Patient lacks adequate housing/supervision. Patient high risk of inadvertent harm to self due to chronic psychotic features. CSW to continue pursuing placement. Situation ongoing, CSW will continue to monitor and update note as more information becomes available.  Update 05/22/2022:  [facility] continues to work on identifying finanicial resources.  Placement continues to be sought.    Reason for Continuation of Hospitalization: Medication stabilization Other; describe psychosis    Estimated Length of Stay: 1-7 days Update 04/27/2022: No changes at this time. Update 05/02/2022:   TBD Update 05/07/22: No changes at this time. Update 05/12/22: No changes at this time.  Update 05/17/2022: 1-7 days  Update 05/22/2022:  TBD  Last 3 [place] Suicide Severity Risk Score: Flowsheet Row Admission (Current) from 04/16/2022 in [place] ED from 04/15/2022 in [facility] at [place] No Risk No Risk        Last PHQ 2/9 Scores:     No data to display          Scribe for Treatment Team: Rozann Lesches, Marlinda Mike 05/22/2022 9:44 AM

## 2022-05-22 NOTE — Progress Notes (Signed)
Recreation Therapy  05/22/2022         Time: 9:45am      Group Topic/Focus: Stress Management   Participation Level: Did not attend   Shanautica Forker E Tallan Sandoz LRT/CTRS  05/22/2022 12:07 PM

## 2022-05-23 DIAGNOSIS — Z5900 Homelessness unspecified: Secondary | ICD-10-CM | POA: Diagnosis not present

## 2022-05-23 DIAGNOSIS — F419 Anxiety disorder, unspecified: Secondary | ICD-10-CM | POA: Diagnosis present

## 2022-05-23 DIAGNOSIS — Z79899 Other long term (current) drug therapy: Secondary | ICD-10-CM | POA: Diagnosis not present

## 2022-05-23 DIAGNOSIS — F200 Paranoid schizophrenia: Secondary | ICD-10-CM | POA: Diagnosis not present

## 2022-05-23 DIAGNOSIS — Z1152 Encounter for screening for COVID-19: Secondary | ICD-10-CM | POA: Diagnosis not present

## 2022-05-23 DIAGNOSIS — Z91199 Patient's noncompliance with other medical treatment and regimen due to unspecified reason: Secondary | ICD-10-CM | POA: Diagnosis not present

## 2022-05-23 DIAGNOSIS — F29 Unspecified psychosis not due to a substance or known physiological condition: Secondary | ICD-10-CM | POA: Diagnosis present

## 2022-05-23 DIAGNOSIS — B1920 Unspecified viral hepatitis C without hepatic coma: Secondary | ICD-10-CM | POA: Diagnosis present

## 2022-05-23 DIAGNOSIS — F17210 Nicotine dependence, cigarettes, uncomplicated: Secondary | ICD-10-CM | POA: Diagnosis present

## 2022-05-23 DIAGNOSIS — F909 Attention-deficit hyperactivity disorder, unspecified type: Secondary | ICD-10-CM | POA: Diagnosis present

## 2022-05-23 DIAGNOSIS — R451 Restlessness and agitation: Secondary | ICD-10-CM | POA: Diagnosis present

## 2022-05-23 DIAGNOSIS — F203 Undifferentiated schizophrenia: Secondary | ICD-10-CM | POA: Diagnosis present

## 2022-05-23 DIAGNOSIS — K5900 Constipation, unspecified: Secondary | ICD-10-CM | POA: Diagnosis not present

## 2022-05-23 DIAGNOSIS — R059 Cough, unspecified: Secondary | ICD-10-CM | POA: Diagnosis not present

## 2022-05-23 DIAGNOSIS — F319 Bipolar disorder, unspecified: Secondary | ICD-10-CM | POA: Diagnosis present

## 2022-05-23 DIAGNOSIS — Z5986 Financial insecurity: Secondary | ICD-10-CM | POA: Diagnosis not present

## 2022-05-23 DIAGNOSIS — Z91148 Patient's other noncompliance with medication regimen for other reason: Secondary | ICD-10-CM | POA: Diagnosis not present

## 2022-05-23 NOTE — Progress Notes (Signed)
Patient calm and pleasant during assessment denying SI/HI/AVH. Pt observed interacting appropriately with staff and peers on the unit. Pt compliant with medication administration per MD orders. Pt given education, support, and encouragement to be acitve in his treatment plan. Pt being monitored Q 15 minutes for safety per unit protocol, remains safe on the unit    

## 2022-05-23 NOTE — Progress Notes (Signed)
D- Patient alert and oriented x 3-4. Affect flat/mood flat. Denies SI/ HI/AVH. Denies pain. He worked with Education officer, museum on placement and "social Security" today. A- Scheduled medications administered to patient, per MD orders. Began Pepcid 20mg  for c/o "indigestion" he sates with fair results. Instructed on action and dose.Support and encouragement provided.  Routine safety checks conducted every 15 minutes.  Patient informed to notify staff with problems or concerns. R- No adverse drug reactions noted, states "indigestion better". Patient compliant with medications and treatment plan. Patient receptive, calm, and cooperative. He stays in his room for most the shift except for meals. Patient contracts for safety and remains safe on the unit at this time.

## 2022-05-23 NOTE — [facility] Group Notes (Signed)
05/23/2022         Time: 1000      Group Topic/Focus: Goal Setting   Participation Level: Did not attend    Rocklin Soderquist E Yurani Fettes LRT/CTRS  05/23/2022 12:36 PM 

## 2022-05-23 NOTE — Group Note (Signed)
[facility] LCSW Group Therapy Note   Group Date: 05/23/2022 Start Time: 1300 End Time: 1400   Type of Therapy/Topic:  Group Therapy:  Balance in Life  Participation Level:  Did Not Attend   Description of Group:    This group will address the concept of balance and how it feels and looks when one is unbalanced. Patients will be encouraged to process areas in their lives that are out of balance, and identify reasons for remaining unbalanced. Facilitators will guide patients utilizing problem- solving interventions to address and correct the stressor making their life unbalanced. Understanding and applying boundaries will be explored and addressed for obtaining  and maintaining a balanced life. Patients will be encouraged to explore ways to assertively make their unbalanced needs known to significant others in their lives, using other group members and facilitator for support and feedback.  Therapeutic Goals: Patient will identify two or more emotions or situations they have that consume much of in their lives. Patient will identify signs/triggers that life has become out of balance:  Patient will identify two ways to set boundaries in order to achieve balance in their lives:  Patient will demonstrate ability to communicate their needs through discussion and/or role plays  Summary of Patient Progress: X   Therapeutic Modalities:   Cognitive Behavioral Therapy Solution-Focused Therapy Assertiveness Training   Naliah Eddington R Faria Casella, LCSW 

## 2022-05-23 NOTE — Progress Notes (Signed)
[facility] MD Progress Note  05/23/2022 3:33 PM Travis Sandoval  MRN:  [id number] Subjective: Patient seen.  No complaint.  No behavior change. Principal Problem: Schizophrenia (Travis Sandoval) Diagnosis: Principal Problem:   Schizophrenia ([place]) Active Problems:   Psychosis (Travis Sandoval)  Total Time spent with patient: 20 minutes  Past Psychiatric History: History of schizophrenia  Past Medical History:  Past Medical History:  Diagnosis Date   ADHD    Bipolar 1 disorder (Travis Sandoval)    Hepatitis C    Schizoaffective disorder (Travis Sandoval)    History reviewed. No pertinent surgical history. Family History:  Family History  Family history unknown: Yes   Family Psychiatric  History: See previous Social History:  Social History   Substance and Sexual Activity  Alcohol Use Not Currently     Social History   Substance and Sexual Activity  Drug Use Not Currently    Social History   Socioeconomic History   Marital status: Unknown    Spouse name: Not on file   Number of children: Not on file   Years of education: Not on file   Highest education level: Not on file  Occupational History   Not on file  Tobacco Use   Smoking status: Every Day    Packs/day: 0.25    Years: 15.00    Total pack years: 3.75    Types: Cigarettes   Smokeless tobacco: Not on file  Vaping Use   Vaping Use: Not on file  Substance and Sexual Activity   Alcohol use: Not Currently   Drug use: Not Currently   Sexual activity: Not Currently  Other Topics Concern   Not on file  Social History Narrative   Not on file   Social Determinants of Health   Financial Resource Strain: Not on file  Food Insecurity: Unknown (05/18/2022)   Hunger Vital Sign    Worried About Running Out of Food in the Last Year: Patient refused    [place] in the Last Year: Patient refused  Transportation Needs: Unknown (05/18/2022)   [place] - Hydrologist (Medical): Patient refused    Lack of Transportation (Non-Medical):  Patient refused  Physical Activity: Not on file  Stress: Not on file  Social Connections: Not on file   Additional Social History:                         Sleep: Fair  Appetite:  Fair  Current Medications: Current Facility-Administered Medications  Medication Dose Route Frequency Provider Last Rate Last Admin   acetaminophen (TYLENOL) tablet 650 mg  650 mg Oral Q6H PRN Travis Handing, NP       alum & mag hydroxide-simeth (MAALOX/MYLANTA) 200-200-20 MG/5ML suspension 30 mL  30 mL Oral Q4H PRN Travis Merl F, NP   30 mL at 05/22/22 0816   cloZAPine (CLOZARIL) tablet 250 mg  250 mg Oral QHS Travis Radney T, MD   250 mg at 05/22/22 2108   famotidine (PEPCID) tablet 20 mg  20 mg Oral BID Travis Soulliere T, MD   20 mg at 05/23/22 4098   hydrOXYzine (ATARAX) tablet 50 mg  50 mg Oral Q6H PRN Travis Jamison T, MD   50 mg at 05/17/22 0944   magnesium hydroxide (MILK OF MAGNESIA) suspension 30 mL  30 mL Oral Daily PRN Travis Merl F, NP       nicotine polacrilex (NICORETTE) gum 2 mg  2 mg Oral PRN Marcanthony Sleight, [name],  MD   2 mg at 04/23/22 2200   [START ON 05/30/2022] paliperidone (INVEGA SUSTENNA) injection 156 mg  156 mg Intramuscular Q28 days Travis Sandoval, [name], MD       ziprasidone (GEODON) injection 20 mg  20 mg Intramuscular Q12H PRN Travis Sera, MD        Lab Results:  Results for orders placed or performed during the hospital encounter of 04/16/22 (from the past 48 hour(s))  CBC with Differential/Platelet     Status: Sandoval   Collection Time: 05/22/22  9:29 AM  Result Value Ref Range   WBC 7.5 4.0 - 10.5 K/uL   RBC 4.57 4.22 - 5.81 MIL/uL   Hemoglobin 14.0 13.0 - 17.0 g/dL   HCT 41.4 39.0 - 52.0 %   MCV 90.6 80.0 - 100.0 fL   MCH 30.6 26.0 - 34.0 pg   MCHC 33.8 30.0 - 36.0 g/dL   RDW 12.3 11.5 - 15.5 %   Platelets 208 150 - 400 K/uL   nRBC 0.0 0.0 - 0.2 %   Neutrophils Relative % 58 %   Neutro Abs 4.4 1.7 - 7.7 K/uL   Lymphocytes Relative 34 %   Lymphs Abs 2.5 0.7 -  4.0 K/uL   Monocytes Relative 5 %   Monocytes Absolute 0.4 0.1 - 1.0 K/uL   Eosinophils Relative 2 %   Eosinophils Absolute 0.1 0.0 - 0.5 K/uL   Basophils Relative 1 %   Basophils Absolute 0.1 0.0 - 0.1 K/uL   Immature Granulocytes 0 %   Abs Immature Granulocytes 0.03 0.00 - 0.07 K/uL    Comment: Performed at [facility], [address] [place] [place] Sand[place], [place] [address]    Blood Alcohol level:  Lab Results  Component Value Date   [facility] <10 60/45/4098    Metabolic Disorder Labs: Lab Results  Component Value Date   HGBA1C 5.5 04/24/2022   MPG 111 04/24/2022   No results found for: "PROLACTIN" Lab Results  Component Value Date   CHOL 220 (H) 04/24/2022   TRIG 700 (H) 04/24/2022   HDL 35 (L) 04/24/2022   CHOLHDL 6.3 04/24/2022   VLDL UNABLE TO CALCULATE IF TRIGLYCERIDE OVER 400 mg/dL 04/24/2022   LDLCALC UNABLE TO CALCULATE IF TRIGLYCERIDE OVER 400 mg/dL 04/24/2022    Physical Findings: AIMS: Facial and Oral Movements Muscles of Facial Expression: [place], normal Lips and Perioral Area: Sandoval, normal Jaw: Sandoval, normal Tongue: [place]oval, normal,Extremity Movements Upper (arms, wrists, hands, fingers): [place], normal Lower (legs, knees, ankles, toes): [place], normal, Trunk Movements Neck, shoulders, hips: [place], normal, Overall Severity Severity of abnormal movements (highest score from questions above): [place], normal Incapacitation due to abnormal movements: [place], normal Patient's awareness of abnormal movements (rate only patient's report): No Awareness, Dental Status Current problems with teeth and/or dentures?: No Does patient usually wear dentures?: No  CIWA:    COWS:     Musculoskeletal: Strength & Muscle Tone: within normal limits Gait & Station: normal Patient leans: N/A  Psychiatric Specialty Exam:  Presentation  General Appearance:  Disheveled  Eye Contact: Minimal  Speech: Pressured  Speech Volume: Normal  Handedness: Right   Mood and Affect   Mood: Euphoric; Irritable  Affect: Inappropriate   Thought Process  Thought Processes: Disorganized  Descriptions of Associations:Loose  Orientation:Partial  Thought Content:Illogical; Scattered  History of Schizophrenia/Schizoaffective disorder:No data recorded Duration of Psychotic Symptoms:No data recorded Hallucinations:No data recorded Ideas of Reference:Delusions; Paranoia  Suicidal Thoughts:No data recorded Homicidal Thoughts:No data recorded  Sensorium  Memory: Immediate Poor;  Recent Poor; Remote Poor  Judgment: Poor  Insight: Poor   Executive Functions  Concentration: Fair  Attention Span: Fair  Recall: Poor  Fund of Knowledge: Poor  Language: Poor   Psychomotor Activity  Psychomotor Activity:No data recorded  Assets  Assets: Desire for Improvement; Social Support   Sleep  Sleep:No data recorded   Physical Exam: Physical Exam Vitals and nursing note reviewed.  Constitutional:      Appearance: Normal appearance.  HENT:     Head: Normocephalic and atraumatic.     Mouth/Throat:     Pharynx: Oropharynx is clear.  Eyes:     Pupils: Pupils are equal, round, and reactive to light.  Cardiovascular:     Rate and Rhythm: Normal rate and regular rhythm.  Pulmonary:     Effort: Pulmonary effort is normal.     Breath sounds: Normal breath sounds.  Abdominal:     General: Abdomen is flat.     Palpations: Abdomen is soft.  Musculoskeletal:        General: Normal range of motion.  Skin:    General: Skin is warm and dry.  Neurological:     General: No focal deficit present.     Mental Status: He is alert. Mental status is at baseline.  Psychiatric:        Attention and Perception: Attention normal.        Mood and Affect: Mood normal.        Speech: Speech normal.        Behavior: Behavior normal.        Thought Content: Thought content normal.        Cognition and Memory: Cognition normal.        Judgment: Judgment normal.     Review of Systems  Constitutional: Negative.   HENT: Negative.    Eyes: Negative.   Respiratory: Negative.    Cardiovascular: Negative.   Gastrointestinal: Negative.   Musculoskeletal: Negative.   Skin: Negative.   Neurological: Negative.   Psychiatric/Behavioral: Negative.     Blood pressure (!) 127/92, pulse (!) 110, temperature 97.8 [place] (36.6 C), temperature source Oral, resp. rate 18, height 5\' 9"  (1.753 m), weight 96.4 kg, SpO2 99 %. Body mass index is 31.38 kg/m.   Treatment Plan Summary: Medication management and Plan no change to medication.  Patient tells me that he met with the representative from the local agency today and they are trying to contact social security to clarify his financial situation which would be a big step towards discharge.  Alethia Berthold, MD 05/23/2022, 3:33 PM

## 2022-05-23 NOTE — Plan of Care (Signed)
  Problem: Education: Goal: Knowledge of General Education information will improve Description: Including pain rating scale, medication(s)/side effects and non-pharmacologic comfort measures Outcome: Progressing   Problem: Health Behavior/Discharge Planning: Goal: Ability to manage health-related needs will improve Outcome: Progressing   Problem: Clinical Measurements: Goal: Ability to maintain clinical measurements within normal limits will improve Outcome: Progressing Goal: Will remain free from infection Outcome: Progressing Goal: Diagnostic test results will improve Outcome: Progressing Goal: Respiratory complications will improve Outcome: Progressing Goal: Cardiovascular complication will be avoided Outcome: Progressing   Problem: Activity: Goal: Risk for activity intolerance will decrease Outcome: Progressing   Problem: Nutrition: Goal: Adequate nutrition will be maintained Outcome: Progressing   Problem: Coping: Goal: Level of anxiety will decrease Outcome: Progressing   Problem: Elimination: Goal: Will not experience complications related to urinary retention Outcome: Progressing   Problem: Pain Managment: Goal: General experience of comfort will improve Outcome: Progressing   Problem: Safety: Goal: Ability to remain free from injury will improve Outcome: Progressing   Problem: Skin Integrity: Goal: Risk for impaired skin integrity will decrease Outcome: Progressing   Problem: Education: Goal: Knowledge of [place] General Education information/materials will improve Outcome: Progressing Goal: Emotional status will improve Outcome: Progressing Goal: Mental status will improve Outcome: Progressing Goal: Verbalization of understanding the information provided will improve Outcome: Progressing   Problem: Activity: Goal: Interest or engagement in activities will improve Outcome: Progressing Goal: Sleeping patterns will improve Outcome:  Progressing   Problem: Coping: Goal: Ability to verbalize frustrations and anger appropriately will improve Outcome: Progressing Goal: Ability to demonstrate self-control will improve Outcome: Progressing   Problem: Health Behavior/Discharge Planning: Goal: Identification of resources available to assist in meeting health care needs will improve Outcome: Progressing Goal: Compliance with treatment plan for underlying cause of condition will improve Outcome: Progressing   Problem: Physical Regulation: Goal: Ability to maintain clinical measurements within normal limits will improve Outcome: Progressing   Problem: Safety: Goal: Periods of time without injury will increase Outcome: Progressing   Problem: Activity: Goal: Will verbalize the importance of balancing activity with adequate rest periods Outcome: Progressing   Problem: Education: Goal: Will be free of psychotic symptoms Outcome: Progressing Goal: Knowledge of the prescribed therapeutic regimen will improve Outcome: Progressing   Problem: Coping: Goal: Coping ability will improve Outcome: Progressing Goal: Will verbalize feelings Outcome: Progressing   Problem: Health Behavior/Discharge Planning: Goal: Compliance with prescribed medication regimen will improve Outcome: Progressing   Problem: Nutritional: Goal: Ability to achieve adequate nutritional intake will improve Outcome: Progressing   Problem: Role Relationship: Goal: Ability to communicate needs accurately will improve Outcome: Progressing Goal: Ability to interact with others will improve Outcome: Progressing   Problem: Safety: Goal: Ability to redirect hostility and anger into socially appropriate behaviors will improve Outcome: Progressing Goal: Ability to remain free from injury will improve Outcome: Progressing   Problem: Self-Care: Goal: Ability to participate in self-care as condition permits will improve Outcome: Progressing    Problem: Self-Concept: Goal: Will verbalize positive feelings about self Outcome: Progressing   

## 2022-05-23 NOTE — B[facility] Group Notes (Signed)
[place] Group Notes:  (Nursing/MHT/Case Management/Adjunct)  Date:  05/23/2022  Time:  10:11 AM  Type of Therapy:   Community Meeting  Participation Level:  Did Not Attend  Travis Sandoval [facility] 05/23/2022, 10:11 AM

## 2022-05-24 DIAGNOSIS — F203 Undifferentiated schizophrenia: Secondary | ICD-10-CM | POA: Diagnosis not present

## 2022-05-24 NOTE — Progress Notes (Signed)
[facility] MD Progress Note  05/24/2022 3:54 PM Travis Sandoval  MRN:  [id number] Subjective: Patient seen.  No change no new complaints. Principal Problem: Schizophrenia ([place]) Diagnosis: Principal Problem:   Schizophrenia ([place]) Active Problems:   Psychosis ([place])  Total Time spent with patient: 15 minutes  Past Psychiatric History: History of schizophrenia  Past Medical History:  Past Medical History:  Diagnosis Date   ADHD    Bipolar 1 disorder ([place])    Hepatitis C    Schizoaffective disorder ([place])    History reviewed. No pertinent surgical history. Family History:  Family History  Family history unknown: Yes   Family Psychiatric  History: See previous Social History:  Social History   Substance and Sexual Activity  Alcohol Use Not Currently     Social History   Substance and Sexual Activity  Drug Use Not Currently    Social History   Socioeconomic History   Marital status: Unknown    Spouse name: Not on file   Number of children: Not on file   Years of education: Not on file   Highest education level: Not on file  Occupational History   Not on file  Tobacco Use   Smoking status: Every Day    Packs/day: 0.25    Years: 15.00    Total pack years: 3.75    Types: Cigarettes   Smokeless tobacco: Not on file  Vaping Use   Vaping Use: Not on file  Substance and Sexual Activity   Alcohol use: Not Currently   Drug use: Not Currently   Sexual activity: Not Currently  Other Topics Concern   Not on file  Social History Narrative   Not on file   Social Determinants of Health   Financial Resource Strain: Not on file  Food Insecurity: Unknown (05/18/2022)   Hunger Vital Sign    Worried About Running Out of Food in the Last Year: Patient refused    [place] in the Last Year: Patient refused  Transportation Needs: Unknown (05/18/2022)   [place] - Hydrologist (Medical): Patient refused    Lack of Transportation (Non-Medical): Patient  refused  Physical Activity: Not on file  Stress: Not on file  Social Connections: Not on file   Additional Social History:                         Sleep: Fair  Appetite:  Fair  Current Medications: Current Facility-Administered Medications  Medication Dose Route Frequency Provider Last Rate Last Admin   acetaminophen (TYLENOL) tablet 650 mg  650 mg Oral Q6H PRN [name], NP       alum & mag hydroxide-simeth (MAALOX/MYLANTA) 200-200-20 MG/5ML suspension 30 mL  30 mL Oral Q4H PRN [name], NP   30 mL at 05/22/22 0816   cloZAPine (CLOZARIL) tablet 250 mg  250 mg Oral QHS Antonie Borjon T, MD   250 mg at 05/23/22 2105   famotidine (PEPCID) tablet 20 mg  20 mg Oral BID Cassandra Harbold T, MD   20 mg at 05/24/22 7782   hydrOXYzine (ATARAX) tablet 50 mg  50 mg Oral Q6H PRN Silvina Hackleman T, MD   50 mg at 05/17/22 0944   magnesium hydroxide (MILK OF MAGNESIA) suspension 30 mL  30 mL Oral Daily PRN [name], NP       nicotine polacrilex (NICORETTE) gum 2 mg  2 mg Oral PRN Keziyah Kneale, [name], MD  2 mg at 04/23/22 2200   [START ON 05/30/2022] paliperidone (INVEGA SUSTENNA) injection 156 mg  156 mg Intramuscular Q28 days Lindsay Soulliere T, MD       ziprasidone (GEODON) injection 20 mg  20 mg Intramuscular Q12H PRN [name], MD        Lab Results: No results found for this or any previous visit (from the past 87 hour(s)).  Blood Alcohol level:  Lab Results  Component Value Date   ETH <10 29/51/8841    Metabolic Disorder Labs: Lab Results  Component Value Date   HGBA1C 5.5 04/24/2022   MPG 111 04/24/2022   No results found for: "PROLACTIN" Lab Results  Component Value Date   CHOL 220 (H) 04/24/2022   TRIG 700 (H) 04/24/2022   HDL 35 (L) 04/24/2022   CHOLHDL 6.3 04/24/2022   VLDL UNABLE TO CALCULATE IF TRIGLYCERIDE OVER 400 mg/dL 04/24/2022   LDLCALC UNABLE TO CALCULATE IF TRIGLYCERIDE OVER 400 mg/dL 04/24/2022    Physical Findings: AIMS: Facial  and Oral Movements Muscles of Facial Expression: None, normal Lips and Perioral Area: None, normal Jaw: None, normal Tongue: None, normal,Extremity Movements Upper (arms, wrists, hands, fingers): None, normal Lower (legs, knees, ankles, toes): None, normal, Trunk Movements Neck, shoulders, hips: None, normal, Overall Severity Severity of abnormal movements (highest score from questions above): None, normal Incapacitation due to abnormal movements: None, normal Patient's awareness of abnormal movements (rate only patient's report): No Awareness, Dental Status Current problems with teeth and/or dentures?: No Does patient usually wear dentures?: No  CIWA:    COWS:     Musculoskeletal: Strength & Muscle Tone: within normal limits Gait & Station: normal Patient leans: N/A  Psychiatric Specialty Exam:  Presentation  General Appearance:  Disheveled  Eye Contact: Minimal  Speech: Pressured  Speech Volume: Normal  Handedness: Right   Mood and Affect  Mood: Euphoric; Irritable  Affect: Inappropriate   Thought Process  Thought Processes: Disorganized  Descriptions of Associations:Loose  Orientation:Partial  Thought Content:Illogical; Scattered  History of Schizophrenia/Schizoaffective disorder:No data recorded Duration of Psychotic Symptoms:No data recorded Hallucinations:No data recorded Ideas of Reference:Delusions; Paranoia  Suicidal Thoughts:No data recorded Homicidal Thoughts:No data recorded  Sensorium  Memory: Immediate Poor; Recent Poor; Remote Poor  Judgment: Poor  Insight: Poor   Executive Functions  Concentration: Fair  Attention Span: Fair  Recall: Poor  Fund of Knowledge: Poor  Language: Poor   Psychomotor Activity  Psychomotor Activity:No data recorded  Assets  Assets: Desire for Improvement; Social Support   Sleep  Sleep:No data recorded   Physical Exam: Physical Exam Vitals and nursing note reviewed.   Constitutional:      Appearance: Normal appearance.  HENT:     Head: Normocephalic and atraumatic.     Mouth/Throat:     Pharynx: Oropharynx is clear.  Eyes:     Pupils: Pupils are equal, round, and reactive to light.  Cardiovascular:     Rate and Rhythm: Normal rate and regular rhythm.  Pulmonary:     Effort: Pulmonary effort is normal.     Breath sounds: Normal breath sounds.  Abdominal:     General: Abdomen is flat.     Palpations: Abdomen is soft.  Musculoskeletal:        General: Normal range of motion.  Skin:    General: Skin is warm and dry.  Neurological:     General: No focal deficit present.     Mental Status: He is alert. Mental status is at baseline.  Psychiatric:  Attention and Perception: Attention normal.        Mood and Affect: Mood normal. Affect is blunt.        Speech: Speech is delayed.        Thought Content: Thought content normal.    Review of Systems  Constitutional: Negative.   HENT: Negative.    Eyes: Negative.   Respiratory: Negative.    Cardiovascular: Negative.   Gastrointestinal: Negative.   Musculoskeletal: Negative.   Skin: Negative.   Neurological: Negative.   Psychiatric/Behavioral: Negative.     Blood pressure 112/80, pulse (!) 117, temperature 97.8 F (36.6 C), temperature source Oral, resp. rate 18, height 5\' 9"  (1.753 m), weight 96.4 kg, SpO2 97 %. Body mass index is 31.38 kg/m.   Treatment Plan Summary: Plan patient is stable and the plan is entirely based on waiting for placement  Alethia Berthold, MD 05/24/2022, 3:54 PM

## 2022-05-24 NOTE — Plan of Care (Signed)
D- Patient alert and oriented. Patient presented in a pleasant mood on assessment reporting that he slept good last night and had no complaints to voice to this Probation officer. Patient denied SI, HI, AVH, and pain at this time. Patient also denied any signs/symptoms of depression and anxiety. Patient had no stated goals for today, stating "I don't really know".  A- Scheduled medications administered to patient, per MD orders. Support and encouragement provided.  Routine safety checks conducted every 15 minutes.  Patient informed to notify staff with problems or concerns.  R- No adverse drug reactions noted. Patient contracts for safety at this time. Patient compliant with medications. Patient receptive, calm, and cooperative. Patient isolates to room, except for meals and medication. Patient remains safe at this time.  Problem: Education: Goal: Knowledge of General Education information will improve Description: Including pain rating scale, medication(s)/side effects and non-pharmacologic comfort measures Outcome: Progressing   Problem: Health Behavior/Discharge Planning: Goal: Ability to manage health-related needs will improve Outcome: Progressing   Problem: Clinical Measurements: Goal: Ability to maintain clinical measurements within normal limits will improve Outcome: Progressing Goal: Will remain free from infection Outcome: Progressing Goal: Diagnostic test results will improve Outcome: Progressing Goal: Respiratory complications will improve Outcome: Progressing Goal: Cardiovascular complication will be avoided Outcome: Progressing   Problem: Activity: Goal: Risk for activity intolerance will decrease Outcome: Progressing   Problem: Nutrition: Goal: Adequate nutrition will be maintained Outcome: Progressing   Problem: Coping: Goal: Level of anxiety will decrease Outcome: Progressing   Problem: Elimination: Goal: Will not experience complications related to urinary  retention Outcome: Progressing   Problem: Pain Managment: Goal: General experience of comfort will improve Outcome: Progressing   Problem: Safety: Goal: Ability to remain free from injury will improve Outcome: Progressing   Problem: Skin Integrity: Goal: Risk for impaired skin integrity will decrease Outcome: Progressing   Problem: Education: Goal: Knowledge of  General Education information/materials will improve Outcome: Progressing Goal: Emotional status will improve Outcome: Progressing Goal: Mental status will improve Outcome: Progressing Goal: Verbalization of understanding the information provided will improve Outcome: Progressing   Problem: Activity: Goal: Interest or engagement in activities will improve Outcome: Progressing Goal: Sleeping patterns will improve Outcome: Progressing   Problem: Coping: Goal: Ability to verbalize frustrations and anger appropriately will improve Outcome: Progressing Goal: Ability to demonstrate self-control will improve Outcome: Progressing   Problem: Health Behavior/Discharge Planning: Goal: Identification of resources available to assist in meeting health care needs will improve Outcome: Progressing Goal: Compliance with treatment plan for underlying cause of condition will improve Outcome: Progressing   Problem: Physical Regulation: Goal: Ability to maintain clinical measurements within normal limits will improve Outcome: Progressing   Problem: Safety: Goal: Periods of time without injury will increase Outcome: Progressing   Problem: Activity: Goal: Will verbalize the importance of balancing activity with adequate rest periods Outcome: Progressing   Problem: Education: Goal: Will be free of psychotic symptoms Outcome: Progressing Goal: Knowledge of the prescribed therapeutic regimen will improve Outcome: Progressing   Problem: Coping: Goal: Coping ability will improve Outcome: Progressing Goal: Will  verbalize feelings Outcome: Progressing   Problem: Health Behavior/Discharge Planning: Goal: Compliance with prescribed medication regimen will improve Outcome: Progressing   Problem: Nutritional: Goal: Ability to achieve adequate nutritional intake will improve Outcome: Progressing   Problem: Role Relationship: Goal: Ability to communicate needs accurately will improve Outcome: Progressing Goal: Ability to interact with others will improve Outcome: Progressing   Problem: Safety: Goal: Ability to redirect hostility and anger into socially  appropriate behaviors will improve Outcome: Progressing Goal: Ability to remain free from injury will improve Outcome: Progressing   Problem: Self-Care: Goal: Ability to participate in self-care as condition permits will improve Outcome: Progressing   Problem: Self-Concept: Goal: Will verbalize positive feelings about self Outcome: Progressing

## 2022-05-24 NOTE — [facility] Group Notes (Signed)
[place] Group Notes:  (Nursing/MHT/Case Management/Adjunct)  Date:  05/24/2022  Time:  9:58 AM  Type of Therapy:   community meeting  Participation Level:  Did Not Attend    Antonieta Pert 05/24/2022, 9:58 AM

## 2022-05-24 NOTE — [facility] Group Notes (Signed)
[place] Group Notes:  (Nursing/MHT/Case Management/Adjunct)  Date:  05/24/2022  Time:  9:17 PM  Type of Therapy:  Group Therapy  Participation Level:  Minimal  Participation Quality:  Attentive  Affect:  Appropriate  Cognitive:  Alert, Appropriate, and Oriented  Insight:  Appropriate and Good  Engagement in Group:  Engaged  Modes of Intervention:  Discussion  Summary of Progress/Problems: States he is ready to go home and be independent.   Ladona Mow 05/24/2022, 9:17 PM

## 2022-05-25 DIAGNOSIS — F203 Undifferentiated schizophrenia: Secondary | ICD-10-CM | POA: Diagnosis not present

## 2022-05-25 NOTE — Progress Notes (Signed)
Patient alert and oriented x 4, affect is flat thoughts are organized and coherent . Patient denies SI/HI/AVH. Patient is not interacting with peers and  forwards very little to staff. Patient is complaint with medication regimen.15 minutes safety checks maintained will continue to monitor.

## 2022-05-25 NOTE — Progress Notes (Signed)
[facility] MD Progress Note  05/25/2022 10:38 AM Travis Sandoval  MRN:  [id number] Subjective: Follow-up patient with schizophrenia.  No new complaints.  Behavior calm.  No change in presentation. Principal Problem: Schizophrenia ([place]) Diagnosis: Principal Problem:   Schizophrenia ([place]) Active Problems:   Psychosis ([place])  Total Time spent with patient: 30 minutes  Past Psychiatric History: Past history of schizophrenia  Past Medical History:  Past Medical History:  Diagnosis Date   ADHD    Bipolar 1 disorder ([place])    Hepatitis C    Schizoaffective disorder ([place])    History reviewed. No pertinent surgical history. Family History:  Family History  Family history unknown: Yes   Family Psychiatric  History: See previous Social History:  Social History   Substance and Sexual Activity  Alcohol Use Not Currently     Social History   Substance and Sexual Activity  Drug Use Not Currently    Social History   Socioeconomic History   Marital status: Unknown    Spouse name: Not on file   Number of children: Not on file   Years of education: Not on file   Highest education level: Not on file  Occupational History   Not on file  Tobacco Use   Smoking status: Every Day    Packs/day: 0.25    Years: 15.00    Total pack years: 3.75    Types: Cigarettes   Smokeless tobacco: Not on file  Vaping Use   Vaping Use: Not on file  Substance and Sexual Activity   Alcohol use: Not Currently   Drug use: Not Currently   Sexual activity: Not Currently  Other Topics Concern   Not on file  Social History Narrative   Not on file   Social Determinants of Health   Financial Resource Strain: Not on file  Food Insecurity: Unknown (05/18/2022)   Hunger Vital Sign    Worried About Running Out of Food in the Last Year: Patient refused    [place] in the Last Year: Patient refused  Transportation Needs: Unknown (05/18/2022)   [place] - Hydrologist (Medical):  Patient refused    Lack of Transportation (Non-Medical): Patient refused  Physical Activity: Not on file  Stress: Not on file  Social Connections: Not on file   Additional Social History:                         Sleep: Fair  Appetite:  Fair  Current Medications: Current Facility-Administered Medications  Medication Dose Route Frequency Provider Last Rate Last Admin   acetaminophen (TYLENOL) tablet 650 mg  650 mg Oral Q6H PRN [name], NP       alum & mag hydroxide-simeth (MAALOX/MYLANTA) 200-200-20 MG/5ML suspension 30 mL  30 mL Oral Q4H PRN [name], NP   30 mL at 05/22/22 0816   cloZAPine (CLOZARIL) tablet 250 mg  250 mg Oral QHS Arien Benincasa T, MD   250 mg at 05/23/22 2105   famotidine (PEPCID) tablet 20 mg  20 mg Oral BID Yan Pankratz T, MD   20 mg at 05/25/22 0850   hydrOXYzine (ATARAX) tablet 50 mg  50 mg Oral Q6H PRN Vernette Moise T, MD   50 mg at 05/17/22 0944   magnesium hydroxide (MILK OF MAGNESIA) suspension 30 mL  30 mL Oral Daily PRN [name], NP   30 mL at 05/25/22 0850   nicotine polacrilex ([place])  gum 2 mg  2 mg Oral PRN Yocheved Depner T, MD   2 mg at 04/23/22 2200   [START ON 05/30/2022] paliperidone (INVEGA SUSTENNA) injection 156 mg  156 mg Intramuscular Q28 days Infant Doane T, MD       ziprasidone (GEODON) injection 20 mg  20 mg Intramuscular Q12H PRN [name], MD        Lab Results: No results found for this or any previous visit (from the past 39 hour(s)).  Blood Alcohol level:  Lab Results  Component Value Date   ETH <10 56/38/7564    Metabolic Disorder Labs: Lab Results  Component Value Date   HGBA1C 5.5 04/24/2022   MPG 111 04/24/2022   No results found for: "PROLACTIN" Lab Results  Component Value Date   CHOL 220 (H) 04/24/2022   TRIG 700 (H) 04/24/2022   HDL 35 (L) 04/24/2022   CHOLHDL 6.3 04/24/2022   VLDL UNABLE TO CALCULATE IF TRIGLYCERIDE OVER 400 mg/dL 04/24/2022   LDLCALC UNABLE TO  CALCULATE IF TRIGLYCERIDE OVER 400 mg/dL 04/24/2022    Physical Findings: AIMS: Facial and Oral Movements Muscles of Facial Expression: None, normal Lips and Perioral Area: None, normal Jaw: None, normal Tongue: None, normal,Extremity Movements Upper (arms, wrists, hands, fingers): None, normal Lower (legs, knees, ankles, toes): None, normal, Trunk Movements Neck, shoulders, hips: None, normal, Overall Severity Severity of abnormal movements (highest score from questions above): None, normal Incapacitation due to abnormal movements: None, normal Patient's awareness of abnormal movements (rate only patient's report): No Awareness, Dental Status Current problems with teeth and/or dentures?: No Does patient usually wear dentures?: No  CIWA:    COWS:     Musculoskeletal: Strength & Muscle Tone: within normal limits Gait & Station: normal Patient leans: N/A  Psychiatric Specialty Exam:  Presentation  General Appearance:  Disheveled  Eye Contact: Minimal  Speech: Pressured  Speech Volume: Normal  Handedness: Right   Mood and Affect  Mood: Euphoric; Irritable  Affect: Inappropriate   Thought Process  Thought Processes: Disorganized  Descriptions of Associations:Loose  Orientation:Partial  Thought Content:Illogical; Scattered  History of Schizophrenia/Schizoaffective disorder:No data recorded Duration of Psychotic Symptoms:No data recorded Hallucinations:No data recorded Ideas of Reference:Delusions; Paranoia  Suicidal Thoughts:No data recorded Homicidal Thoughts:No data recorded  Sensorium  Memory: Immediate Poor; Recent Poor; Remote Poor  Judgment: Poor  Insight: Poor   Executive Functions  Concentration: Fair  Attention Span: Fair  Recall: Poor  Fund of Knowledge: Poor  Language: Poor   Psychomotor Activity  Psychomotor Activity:No data recorded  Assets  Assets: Desire for Improvement; Social Support   Sleep   Sleep:No data recorded   Physical Exam: Physical Exam Vitals reviewed.  Constitutional:      Appearance: Normal appearance.  HENT:     Head: Normocephalic and atraumatic.     Mouth/Throat:     Pharynx: Oropharynx is clear.  Eyes:     Pupils: Pupils are equal, round, and reactive to light.  Cardiovascular:     Rate and Rhythm: Normal rate and regular rhythm.  Pulmonary:     Effort: Pulmonary effort is normal.     Breath sounds: Normal breath sounds.  Abdominal:     General: Abdomen is flat.     Palpations: Abdomen is soft.  Musculoskeletal:        General: Normal range of motion.  Skin:    General: Skin is warm and dry.  Neurological:     General: No focal deficit present.     Mental  Status: He is alert. Mental status is at baseline.  Psychiatric:        Mood and Affect: Mood normal.        Thought Content: Thought content normal.    Review of Systems  Constitutional: Negative.   HENT: Negative.    Eyes: Negative.   Respiratory: Negative.    Cardiovascular: Negative.   Gastrointestinal: Negative.   Musculoskeletal: Negative.   Skin: Negative.   Neurological: Negative.   Psychiatric/Behavioral: Negative.     Blood pressure 120/83, pulse (!) 111, temperature 97.9 F (36.6 C), temperature source Oral, resp. rate 18, height 5\' 9"  (1.753 m), weight 96.4 kg, SpO2 97 %. Body mass index is 31.38 kg/m.   Treatment Plan Summary: Medication management and Plan no change to patient's situation.  Patient is entirely in the hospital waiting for placement once finances can be arranged.  No change to medication for now supportive counseling and encouragement.  Alethia Berthold, MD 05/25/2022, 10:38 AM

## 2022-05-25 NOTE — Plan of Care (Signed)
D- Patient alert and oriented. Patient presented in a pleasant mood on assessment reporting that he slept good last night and had some complaints of mild constipation, in which he did request PRN medication to help with relief. Patient denied SI, HI, AVH, and pain at this time. Patient also denied any signs/symptoms of depression and anxiety. Patient had no stated goals for today.  A- Scheduled medications administered to patient, per MD orders. Support and encouragement provided.  Routine safety checks conducted every 15 minutes.  Patient informed to notify staff with problems or concerns.  R- No adverse drug reactions noted. Patient contracts for safety at this time. Patient compliant with medications. Patient receptive, calm, and cooperative. Patient isolates to room, except for meals and medication. Patient remains safe at this time.  Problem: Education: Goal: Knowledge of General Education information will improve Description: Including pain rating scale, medication(s)/side effects and non-pharmacologic comfort measures Outcome: Progressing   Problem: Health Behavior/Discharge Planning: Goal: Ability to manage health-related needs will improve Outcome: Progressing   Problem: Clinical Measurements: Goal: Ability to maintain clinical measurements within normal limits will improve Outcome: Progressing Goal: Will remain free from infection Outcome: Progressing Goal: Diagnostic test results will improve Outcome: Progressing Goal: Respiratory complications will improve Outcome: Progressing Goal: Cardiovascular complication will be avoided Outcome: Progressing   Problem: Activity: Goal: Risk for activity intolerance will decrease Outcome: Progressing   Problem: Nutrition: Goal: Adequate nutrition will be maintained Outcome: Progressing   Problem: Coping: Goal: Level of anxiety will decrease Outcome: Progressing   Problem: Elimination: Goal: Will not experience complications  related to urinary retention Outcome: Progressing   Problem: Pain Managment: Goal: General experience of comfort will improve Outcome: Progressing   Problem: Safety: Goal: Ability to remain free from injury will improve Outcome: Progressing   Problem: Skin Integrity: Goal: Risk for impaired skin integrity will decrease Outcome: Progressing   Problem: Education: Goal: Knowledge of [place] General Education information/materials will improve Outcome: Progressing Goal: Emotional status will improve Outcome: Progressing Goal: Mental status will improve Outcome: Progressing Goal: Verbalization of understanding the information provided will improve Outcome: Progressing   Problem: Activity: Goal: Interest or engagement in activities will improve Outcome: Progressing Goal: Sleeping patterns will improve Outcome: Progressing   Problem: Coping: Goal: Ability to verbalize frustrations and anger appropriately will improve Outcome: Progressing Goal: Ability to demonstrate self-control will improve Outcome: Progressing   Problem: Health Behavior/Discharge Planning: Goal: Identification of resources available to assist in meeting health care needs will improve Outcome: Progressing Goal: Compliance with treatment plan for underlying cause of condition will improve Outcome: Progressing   Problem: Physical Regulation: Goal: Ability to maintain clinical measurements within normal limits will improve Outcome: Progressing   Problem: Safety: Goal: Periods of time without injury will increase Outcome: Progressing   Problem: Activity: Goal: Will verbalize the importance of balancing activity with adequate rest periods Outcome: Progressing   Problem: Education: Goal: Will be free of psychotic symptoms Outcome: Progressing Goal: Knowledge of the prescribed therapeutic regimen will improve Outcome: Progressing   Problem: Coping: Goal: Coping ability will improve Outcome:  Progressing Goal: Will verbalize feelings Outcome: Progressing   Problem: Health Behavior/Discharge Planning: Goal: Compliance with prescribed medication regimen will improve Outcome: Progressing   Problem: Nutritional: Goal: Ability to achieve adequate nutritional intake will improve Outcome: Progressing   Problem: Role Relationship: Goal: Ability to communicate needs accurately will improve Outcome: Progressing Goal: Ability to interact with others will improve Outcome: Progressing   Problem: Safety: Goal: Ability to redirect hostility  and anger into socially appropriate behaviors will improve Outcome: Progressing Goal: Ability to remain free from injury will improve Outcome: Progressing   Problem: Self-Care: Goal: Ability to participate in self-care as condition permits will improve Outcome: Progressing   Problem: Self-Concept: Goal: Will verbalize positive feelings about self Outcome: Progressing

## 2022-05-26 DIAGNOSIS — F203 Undifferentiated schizophrenia: Secondary | ICD-10-CM | POA: Diagnosis not present

## 2022-05-26 NOTE — Progress Notes (Signed)
[facility] MD Progress Note  05/26/2022 1:49 PM Travis Sandoval  MRN:  [id number] Subjective: No change in presentation.  No complaints.  Does not appear to be agitated or actively responding to internal stimuli.  No dangerous behavior. Principal Problem: Schizophrenia ([place]) Diagnosis: Principal Problem:   Schizophrenia ([place]) Active Problems:   Psychosis ([place])  Total Time spent with patient: 20 minutes  Past Psychiatric History: History of schizophrenia  Past Medical History:  Past Medical History:  Diagnosis Date   ADHD    Bipolar 1 disorder ([place])    Hepatitis C    Schizoaffective disorder ([place])    History reviewed. No pertinent surgical history. Family History:  Family History  Family history unknown: Yes   Family Psychiatric  History: See previous Social History:  Social History   Substance and Sexual Activity  Alcohol Use Not Currently     Social History   Substance and Sexual Activity  Drug Use Not Currently    Social History   Socioeconomic History   Marital status: Unknown    Spouse name: Not on file   Number of children: Not on file   Years of education: Not on file   Highest education level: Not on file  Occupational History   Not on file  Tobacco Use   Smoking status: Every Day    Packs/day: 0.25    Years: 15.00    Total pack years: 3.75    Types: Cigarettes   Smokeless tobacco: Not on file  Vaping Use   Vaping Use: Not on file  Substance and Sexual Activity   Alcohol use: Not Currently   Drug use: Not Currently   Sexual activity: Not Currently  Other Topics Concern   Not on file  Social History Narrative   Not on file   Social Determinants of Health   Financial Resource Strain: Not on file  Food Insecurity: Unknown (05/18/2022)   Hunger Vital Sign    Worried About Running Out of Food in the Last Year: Patient refused    [place] in the Last Year: Patient refused  Transportation Needs: Unknown (05/18/2022)   [place] - Armed forces logistics/support/administrative officer (Medical): Patient refused    Lack of Transportation (Non-Medical): Patient refused  Physical Activity: Not on file  Stress: Not on file  Social Connections: Not on file   Additional Social History:                         Sleep: Fair  Appetite:  Fair  Current Medications: Current Facility-Administered Medications  Medication Dose Route Frequency Provider Last Rate Last Admin   acetaminophen (TYLENOL) tablet 650 mg  650 mg Oral Q6H PRN [name], NP       alum & mag hydroxide-simeth (MAALOX/MYLANTA) 200-200-20 MG/5ML suspension 30 mL  30 mL Oral Q4H PRN [name], NP   30 mL at 05/22/22 0816   cloZAPine (CLOZARIL) tablet 250 mg  250 mg Oral QHS Eugune Sine T, MD   250 mg at 05/25/22 2135   famotidine (PEPCID) tablet 20 mg  20 mg Oral BID Strider Vallance T, MD   20 mg at 05/26/22 0804   hydrOXYzine (ATARAX) tablet 50 mg  50 mg Oral Q6H PRN Destaney Sarkis, [name], MD   50 mg at 05/17/22 0944   magnesium hydroxide (MILK OF MAGNESIA) suspension 30 mL  30 mL Oral Daily PRN [name], NP   30 mL at  05/25/22 0850   nicotine polacrilex (NICORETTE) gum 2 mg  2 mg Oral PRN Rokhaya Quinn T, MD   2 mg at 04/23/22 2200   [START ON 05/30/2022] paliperidone (INVEGA SUSTENNA) injection 156 mg  156 mg Intramuscular Q28 days Storm Dulski T, MD       ziprasidone (GEODON) injection 20 mg  20 mg Intramuscular Q12H PRN [name], MD        Lab Results: No results found for this or any previous visit (from the past 67 hour(s)).  Blood Alcohol level:  Lab Results  Component Value Date   ETH <10 37/01/6268    Metabolic Disorder Labs: Lab Results  Component Value Date   HGBA1C 5.5 04/24/2022   MPG 111 04/24/2022   No results found for: "PROLACTIN" Lab Results  Component Value Date   CHOL 220 (H) 04/24/2022   TRIG 700 (H) 04/24/2022   HDL 35 (L) 04/24/2022   CHOLHDL 6.3 04/24/2022   VLDL UNABLE TO CALCULATE IF TRIGLYCERIDE OVER 400 mg/dL  04/24/2022   LDLCALC UNABLE TO CALCULATE IF TRIGLYCERIDE OVER 400 mg/dL 04/24/2022    Physical Findings: AIMS: Facial and Oral Movements Muscles of Facial Expression: None, normal Lips and Perioral Area: None, normal Jaw: None, normal Tongue: None, normal,Extremity Movements Upper (arms, wrists, hands, fingers): None, normal Lower (legs, knees, ankles, toes): None, normal, Trunk Movements Neck, shoulders, hips: None, normal, Overall Severity Severity of abnormal movements (highest score from questions above): None, normal Incapacitation due to abnormal movements: None, normal Patient's awareness of abnormal movements (rate only patient's report): No Awareness, Dental Status Current problems with teeth and/or dentures?: No Does patient usually wear dentures?: No  CIWA:    COWS:     Musculoskeletal: Strength & Muscle Tone: within normal limits Gait & Station: normal Patient leans: N/A  Psychiatric Specialty Exam:  Presentation  General Appearance:  Disheveled  Eye Contact: Minimal  Speech: Pressured  Speech Volume: Normal  Handedness: Right   Mood and Affect  Mood: Euphoric; Irritable  Affect: Inappropriate   Thought Process  Thought Processes: Disorganized  Descriptions of Associations:Loose  Orientation:Partial  Thought Content:Illogical; Scattered  History of Schizophrenia/Schizoaffective disorder:No data recorded Duration of Psychotic Symptoms:No data recorded Hallucinations:No data recorded Ideas of Reference:Delusions; Paranoia  Suicidal Thoughts:No data recorded Homicidal Thoughts:No data recorded  Sensorium  Memory: Immediate Poor; Recent Poor; Remote Poor  Judgment: Poor  Insight: Poor   Executive Functions  Concentration: Fair  Attention Span: Fair  Recall: Poor  Fund of Knowledge: Poor  Language: Poor   Psychomotor Activity  Psychomotor Activity:No data recorded  Assets  Assets: Desire for Improvement;  Social Support   Sleep  Sleep:No data recorded   Physical Exam: Physical Exam Vitals and nursing note reviewed.  Constitutional:      Appearance: Normal appearance.  HENT:     Head: Normocephalic and atraumatic.     Mouth/Throat:     Pharynx: Oropharynx is clear.  Eyes:     Pupils: Pupils are equal, round, and reactive to light.  Cardiovascular:     Rate and Rhythm: Normal rate and regular rhythm.  Pulmonary:     Effort: Pulmonary effort is normal.     Breath sounds: Normal breath sounds.  Abdominal:     General: Abdomen is flat.     Palpations: Abdomen is soft.  Musculoskeletal:        General: Normal range of motion.  Skin:    General: Skin is warm and dry.  Neurological:  General: No focal deficit present.     Mental Status: He is alert. Mental status is at baseline.  Psychiatric:        Attention and Perception: Attention normal.        Mood and Affect: Mood normal.        Speech: Speech normal.        Behavior: Behavior normal.        Thought Content: Thought content normal.        Cognition and Memory: Cognition normal.        Judgment: Judgment normal.    Review of Systems  Constitutional: Negative.   HENT: Negative.    Eyes: Negative.   Respiratory: Negative.    Cardiovascular: Negative.   Gastrointestinal: Negative.   Musculoskeletal: Negative.   Skin: Negative.   Neurological: Negative.   Psychiatric/Behavioral: Negative.     Blood pressure 117/80, pulse (!) 103, temperature 97.7 F (36.5 C), temperature source Oral, resp. rate 18, height 5\' 9"  (1.753 m), weight 96.4 kg, SpO2 96 %. Body mass index is 31.38 kg/m.   Treatment Plan Summary: Medication management and Plan no change to medication.  Continue current plan.  Waiting for discharge.  Alethia Berthold, MD 05/26/2022, 1:49 PM

## 2022-05-26 NOTE — [facility] Group Notes (Signed)
[facility] Group Notes:  (Nursing/MHT/Case Management/Adjunct)  Date:  05/26/2022  Time:  9:52 AM  Type of Therapy:   community meeting  Participation Level:  Did Not Attend    Antonieta Pert 05/26/2022, 9:52 AM

## 2022-05-26 NOTE — Group Note (Signed)
LCSW Group Therapy Note  Group Date: 05/26/2022 Start Time: 1300 End Time: 1400   Type of Therapy and Topic:  Group Therapy: Self-Harm Alternatives  Participation Level:  Did Not Attend   Description of Group:   Patients participated in a discussion regarding non-suicidal self-injurious behavior (NSSIB, or self-harm) and the stigma surrounding it. There was also discussion surrounding how other maladaptive coping skills could be seen as self-harm, such as substance abuse. Participants were invited to share their experiences with self-harm, with emphasis being placed on the motivation for self-harm (such as release, punishment, feeling numb, etc). Patients were then asked to brainstorm potential substitutions for self-harm and were provided with a handout entitled, "Distraction Techniques and Alternative Coping Strategies," published by [facility] Self-Injury Recovery.  Therapeutic Goals:  Patients will be given the opportunity to discuss NSSIB in a non-judgmental and therapeutic environment. Patients will identify which feelings lead to NSSIB.  Patients will discuss potential healthy coping skills to replace NSSIB Open discussion will specifically address stigma and shame surrounding NSSIB.   Summary of Patient Progress:  Did not attend  Therapeutic Modalities:   Cognitive [place], [place] 05/26/2022  3:38 PM

## 2022-05-26 NOTE — Plan of Care (Signed)
D: Patient alert and oriented. Patient denies pain. Patient denies anxiety and depression. Patient denies SI/HI/AVH. Patient has been isolative to room during shift with exception to coming out for meals and medication.  A: Scheduled medications administered to patient, per MD orders.  Support and encouragement provided to patient.  Q15 minute safety checks maintained.   R: Patient compliant with medication administration and treatment plan. No adverse drug reactions noted. Patient remains safe on the unit at this time. Problem: Education: Goal: Knowledge of [facility] Education information/materials will improve Outcome: Progressing Goal: Verbalization of understanding the information provided will improve Outcome: Progressing   Problem: Health Behavior/Discharge Planning: Goal: Compliance with treatment plan for underlying cause of condition will improve Outcome: Progressing   Problem: Physical Regulation: Goal: Ability to maintain clinical measurements within normal limits will improve Outcome: Progressing   Problem: Safety: Goal: Periods of time without injury will increase Outcome: Progressing

## 2022-05-27 DIAGNOSIS — F203 Undifferentiated schizophrenia: Secondary | ICD-10-CM | POA: Diagnosis not present

## 2022-05-27 NOTE — Group Note (Signed)
LCSW Group Therapy Note  Group Date: 05/27/2022 Start Time: 1300 End Time: 1400   Type of Therapy and Topic:  Group Therapy: Using "I" Statements  Participation Level:  Did Not Attend  Description of Group:  Patients were asked to provide details of some interpersonal conflicts they have experienced. Patients were then educated about "I" statements, communication which focuses on feelings or views of the speaker rather than what the other person is doing. T group members were asked to reflect on past conflicts and to provide specific examples for utilizing "I" statements.  Therapeutic Goals:  Patients will verbalize understanding of ineffective communication and effective communication. Patients will be able to empathize with whom they are having conflict. Patients will practice effective communication in the form of "I" statements.    Summary of Patient Progress:   Patient did not attend group despite encouraged participation.   Therapeutic Modalities:   Cognitive Behavioral Therapy Solution-Focused Therapy    Adriel Desrosier W Jerimie Mancuso, LCSW 05/27/2022  2:52 PM    

## 2022-05-27 NOTE — Plan of Care (Signed)
Patient stays in room most of the shift. Patient verbalized no issues. When encouraged for groups patient sates " I don't want to go." Denies SI,HI and AVH. Appetite and energy level good. Support and encouragement given.

## 2022-05-27 NOTE — Progress Notes (Signed)
[facility] MD Progress Note  05/27/2022 12:53 PM Travis Sandoval  MRN:  [id number] Subjective: Follow-up 42 year old man with schizophrenia.  No change in behavior.  Stays in his room most of the time.  Still eating okay.  Basically taking care of hygiene.  No new complaints.  No behavior issues.  We have not had any progress yet and finding out a way to get him out of the hospital. Principal Problem: Schizophrenia ([place]) Diagnosis: Principal Problem:   Schizophrenia ([place]) Active Problems:   Psychosis ([place])  Total Time spent with patient: 30 minutes  Past Psychiatric History: Past history of schizophrenia with lengthy hospitalizations  Past Medical History:  Past Medical History:  Diagnosis Date   ADHD    Bipolar 1 disorder ([place])    Hepatitis C    Schizoaffective disorder ([place])    History reviewed. No pertinent surgical history. Family History:  Family History  Family history unknown: Yes   Family Psychiatric  History: See previous Social History:  Social History   Substance and Sexual Activity  Alcohol Use Not Currently     Social History   Substance and Sexual Activity  Drug Use Not Currently    Social History   Socioeconomic History   Marital status: Unknown    Spouse name: Not on file   Number of children: Not on file   Years of education: Not on file   Highest education level: Not on file  Occupational History   Not on file  Tobacco Use   Smoking status: Every Day    Packs/day: 0.25    Years: 15.00    Total pack years: 3.75    Types: Cigarettes   Smokeless tobacco: Not on file  Vaping Use   Vaping Use: Not on file  Substance and Sexual Activity   Alcohol use: Not Currently   Drug use: Not Currently   Sexual activity: Not Currently  Other Topics Concern   Not on file  Social History Narrative   Not on file   Social Determinants of Health   Financial Resource Strain: Not on file  Food Insecurity: Unknown (05/18/2022)   Hunger Vital Sign    Worried About  Running Out of Food in the Last Year: Patient refused    [place] in the Last Year: Patient refused  Transportation Needs: Unknown (05/18/2022)   [place] - Hydrologist (Medical): Patient refused    Lack of Transportation (Non-Medical): Patient refused  Physical Activity: Not on file  Stress: Not on file  Social Connections: Not on file   Additional Social History:                         Sleep: Fair  Appetite:  Fair  Current Medications: Current Facility-Administered Medications  Medication Dose Route Frequency Provider Last Rate Last Admin   acetaminophen (TYLENOL) tablet 650 mg  650 mg Oral Q6H PRN [name], NP       alum & mag hydroxide-simeth (MAALOX/MYLANTA) 200-200-20 MG/5ML suspension 30 mL  30 mL Oral Q4H PRN [name], NP   30 mL at 05/22/22 0816   cloZAPine (CLOZARIL) tablet 250 mg  250 mg Oral QHS Aviyanna Colbaugh T, MD   250 mg at 05/26/22 2110   famotidine (PEPCID) tablet 20 mg  20 mg Oral BID Breeanne Oblinger T, MD   20 mg at 05/27/22 0751   hydrOXYzine (ATARAX) tablet 50 mg  50 mg Oral Q6H  PRN Harmonee Tozer, [name], MD   50 mg at 05/17/22 0944   magnesium hydroxide (MILK OF MAGNESIA) suspension 30 mL  30 mL Oral Daily PRN [name], NP   30 mL at 05/25/22 0850   nicotine polacrilex (NICORETTE) gum 2 mg  2 mg Oral PRN Rowdy Guerrini, [name], MD   2 mg at 04/23/22 2200   [START ON 05/30/2022] paliperidone (INVEGA SUSTENNA) injection 156 mg  156 mg Intramuscular Q28 days Shante Archambeault T, MD       ziprasidone (GEODON) injection 20 mg  20 mg Intramuscular Q12H PRN [name], MD        Lab Results: No results found for this or any previous visit (from the past 48 hour(s)).  Blood Alcohol level:  Lab Results  Component Value Date   ETH <10 59/56/3875    Metabolic Disorder Labs: Lab Results  Component Value Date   HGBA1C 5.5 04/24/2022   MPG 111 04/24/2022   No results found for: "PROLACTIN" Lab Results   Component Value Date   CHOL 220 (H) 04/24/2022   TRIG 700 (H) 04/24/2022   HDL 35 (L) 04/24/2022   CHOLHDL 6.3 04/24/2022   VLDL UNABLE TO CALCULATE IF TRIGLYCERIDE OVER 400 mg/dL 04/24/2022   LDLCALC UNABLE TO CALCULATE IF TRIGLYCERIDE OVER 400 mg/dL 04/24/2022    Physical Findings: AIMS: Facial and Oral Movements Muscles of Facial Expression: None, normal Lips and Perioral Area: None, normal Jaw: None, normal Tongue: None, normal,Extremity Movements Upper (arms, wrists, hands, fingers): None, normal Lower (legs, knees, ankles, toes): None, normal, Trunk Movements Neck, shoulders, hips: None, normal, Overall Severity Severity of abnormal movements (highest score from questions above): None, normal Incapacitation due to abnormal movements: None, normal Patient's awareness of abnormal movements (rate only patient's report): No Awareness, Dental Status Current problems with teeth and/or dentures?: No Does patient usually wear dentures?: No  CIWA:    COWS:     Musculoskeletal: Strength & Muscle Tone: within normal limits Gait & Station: normal Patient leans: N/A  Psychiatric Specialty Exam:  Presentation  General Appearance:  Disheveled  Eye Contact: Minimal  Speech: Pressured  Speech Volume: Normal  Handedness: Right   Mood and Affect  Mood: Euphoric; Irritable  Affect: Inappropriate   Thought Process  Thought Processes: Disorganized  Descriptions of Associations:Loose  Orientation:Partial  Thought Content:Illogical; Scattered  History of Schizophrenia/Schizoaffective disorder:No data recorded Duration of Psychotic Symptoms:No data recorded Hallucinations:No data recorded Ideas of Reference:Delusions; Paranoia  Suicidal Thoughts:No data recorded Homicidal Thoughts:No data recorded  Sensorium  Memory: Immediate Poor; Recent Poor; Remote Poor  Judgment: Poor  Insight: Poor   Executive Functions  Concentration: Fair  Attention  Span: Fair  Recall: Poor  Fund of Knowledge: Poor  Language: Poor   Psychomotor Activity  Psychomotor Activity:No data recorded  Assets  Assets: Desire for Improvement; Social Support   Sleep  Sleep:No data recorded   Physical Exam: Physical Exam Vitals and nursing note reviewed.  Constitutional:      Appearance: Normal appearance.  HENT:     Head: Normocephalic and atraumatic.     Mouth/Throat:     Pharynx: Oropharynx is clear.  Eyes:     Pupils: Pupils are equal, round, and reactive to light.  Cardiovascular:     Rate and Rhythm: Normal rate and regular rhythm.  Pulmonary:     Effort: Pulmonary effort is normal.     Breath sounds: Normal breath sounds.  Abdominal:     General: Abdomen is flat.  Palpations: Abdomen is soft.  Musculoskeletal:        General: Normal range of motion.  Skin:    General: Skin is warm and dry.  Neurological:     General: No focal deficit present.     Mental Status: He is alert. Mental status is at baseline.  Psychiatric:        Attention and Perception: Attention normal.        Mood and Affect: Mood normal. Affect is blunt.        Speech: Speech is delayed.        Behavior: Behavior normal.        Thought Content: Thought content normal.        Cognition and Memory: Cognition is impaired.    Review of Systems  Constitutional: Negative.   HENT: Negative.    Eyes: Negative.   Respiratory: Negative.    Cardiovascular: Negative.   Gastrointestinal: Negative.   Musculoskeletal: Negative.   Skin: Negative.   Neurological: Negative.   Psychiatric/Behavioral: Negative.     Blood pressure 108/63, pulse 95, temperature 98 F (36.7 C), resp. rate 18, height 5\' 9"  (1.753 m), weight 96.4 kg, SpO2 95 %. Body mass index is 31.38 kg/m.   Treatment Plan Summary: Medication management and Plan patient still primarily awaiting placement.  No change to treatment plan.  Vital stable no health issues.  Encourage patient to let us  know if he needs anything or has any new problems.  Alethia Berthold, MD 05/27/2022, 12:53 PM

## 2022-05-27 NOTE — Progress Notes (Signed)
   05/27/22 0200  Psych Admission Type (Psych Patients Only)  Admission Status Voluntary  Psychosocial Assessment  Patient Complaints None  Eye Contact Fair  Facial Expression Flat  Affect Flat  Speech Logical/coherent  Interaction Assertive  Motor Activity Slow  Appearance/Hygiene In scrubs  Behavior Characteristics Appropriate to situation;Cooperative  Mood Pleasant  Thought Process  Coherency Circumstantial  Content WDL  Delusions None reported or observed  Perception WDL  Hallucination None reported or observed  Judgment WDL  Confusion None  Danger to Self  Current suicidal ideation? Denies  Danger to Others  Danger to Others None reported or observed

## 2022-05-27 NOTE — BH IP Treatment Plan (Signed)
Interdisciplinary Treatment and Diagnostic Plan Update  05/27/2022 Time of Session: 0930 Travis Sandoval MRN: [id number]  Principal Diagnosis: Schizophrenia [facility])  Secondary Diagnoses: Principal Problem:   Schizophrenia ([place]) Active Problems:   Psychosis ([place])   Current Medications:  Current Facility-Administered Medications  Medication Dose Route Frequency Provider Last Rate Last Admin   acetaminophen (TYLENOL) tablet 650 mg  650 mg Oral Q6H PRN [name], NP       alum & mag hydroxide-simeth (MAALOX/MYLANTA) 200-200-20 MG/5ML suspension 30 mL  30 mL Oral Q4H PRN [name], NP   30 mL at 05/22/22 0816   cloZAPine (CLOZARIL) tablet 250 mg  250 mg Oral QHS [name], [name], MD   250 mg at 05/26/22 2110   famotidine (PEPCID) tablet 20 mg  20 mg Oral BID [name], [name], MD   20 mg at 05/27/22 0751   hydrOXYzine (ATARAX) tablet 50 mg  50 mg Oral Q6H PRN [name], [name], MD   50 mg at 05/17/22 0944   magnesium hydroxide (MILK OF MAGNESIA) suspension 30 mL  30 mL Oral Daily PRN [name], NP   30 mL at 05/25/22 0850   nicotine polacrilex (NICORETTE) gum 2 mg  2 mg Oral PRN [name], [name], MD   2 mg at 04/23/22 2200   [START ON 05/30/2022] paliperidone (INVEGA SUSTENNA) injection 156 mg  156 mg Intramuscular Q28 days [name], [name], MD       ziprasidone (GEODON) injection 20 mg  20 mg Intramuscular Q12H PRN [name], MD       PTA Medications: Medications Prior to Admission  Medication Sig Dispense Refill Last Dose   ABILIFY MAINTENA 400 MG SRER injection Inject 400 mg into the muscle every 28 (twenty-eight) days.      ARIPiprazole (ABILIFY) 10 MG tablet Take 10 mg by mouth daily. (Patient not taking: Reported on 04/16/2022)      atomoxetine (STRATTERA) 40 MG capsule Take 40 mg by mouth daily. (Patient not taking: Reported on 04/16/2022)      atorvastatin (LIPITOR) 40 MG tablet Take 40 mg by mouth daily.      budesonide-formoterol (SYMBICORT) 160-4.5 MCG/ACT  inhaler Inhale 2 puffs into the lungs.      cetirizine (ZYRTEC) 10 MG tablet Take 10 mg by mouth daily.      clozapine (CLOZARIL) 200 MG tablet Take 200 mg by mouth 2 (two) times daily. Take along with one 25 mg tablet for total 225 mg twice daily      cloZAPine (CLOZARIL) 25 MG tablet Take 25 mg by mouth 2 (two) times daily. Take along with one 200 mg tablet for total 225 mg twice daily      Dexlansoprazole (DEXILANT) 30 MG capsule Take 30 mg by mouth daily. (Patient not taking: Reported on 04/16/2022)      hydrOXYzine (VISTARIL) 50 MG capsule Take 50 mg by mouth 3 (three) times daily as needed for itching. (Patient not taking: Reported on 04/16/2022)      Melatonin 5 MG TABS Take 5 mg by mouth at bedtime as needed (sleep).      metoprolol succinate (TOPROL-XL) 25 MG 24 hr tablet Take 12.5 mg by mouth daily.      Phosphatidyl Choline 65 MG TABS Take 1 tablet by mouth daily.  (Patient not taking: Reported on 04/16/2022)      valproic acid (DEPAKENE) 250 MG/5ML solution Take 750-1,000 mLs by mouth 2 (two) times daily. 1000 mg (20 mL) every morning and 750  mg (15 mL) daily at bedtime       Patient Stressors: Other: Psychosis    Patient Strengths: Ability for insight  Active sense of humor  Average or above average intelligence  Capable of independent living  Supportive family/friends   Treatment Modalities: Medication Management, Group therapy, Case management,  1 to 1 session with clinician, Psychoeducation, Recreational therapy.   Physician Treatment Plan for Primary Diagnosis: Schizophrenia ([place]) Long Term Goal(s):     Short Term Goals: None, pt is a poor hx.  Medication Management: Evaluate patient's response, side effects, and tolerance of medication regimen.  Therapeutic Interventions: 1 to 1 sessions, Unit Group sessions and Medication administration.  Evaluation of Outcomes: Progressing  Physician Treatment Plan for Secondary Diagnosis: Principal Problem:   Schizophrenia  ([place]) Active Problems:   Psychosis ([place])  Long Term Goal(s):     Short Term Goals: None, pt is a poor hx.     Medication Management: Evaluate patient's response, side effects, and tolerance of medication regimen.  Therapeutic Interventions: 1 to 1 sessions, Unit Group sessions and Medication administration.  Evaluation of Outcomes: Progressing   RN Treatment Plan for Primary Diagnosis: Schizophrenia ([place]) Long Term Goal(s): Knowledge of disease and therapeutic regimen to maintain health will improve  Short Term Goals: Ability to remain free from injury will improve, Ability to verbalize frustration and anger appropriately will improve, Ability to demonstrate self-control, Ability to participate in decision making will improve, Ability to verbalize feelings will improve, Ability to disclose and discuss suicidal ideas, Ability to identify and develop effective coping behaviors will improve, and Compliance with prescribed medications will improve  Medication Management: RN will administer medications as ordered by provider, will assess and evaluate patient's response and provide education to patient for prescribed medication. RN will report any adverse and/or side effects to prescribing provider.  Therapeutic Interventions: 1 on 1 counseling sessions, Psychoeducation, Medication administration, Evaluate responses to treatment, Monitor vital signs and CBGs as ordered, Perform/monitor CIWA, COWS, AIMS and Fall Risk screenings as ordered, Perform wound care treatments as ordered.  Evaluation of Outcomes: Progressing   LCSW Treatment Plan for Primary Diagnosis: Schizophrenia ([place]) Long Term Goal(s): Safe transition to appropriate next level of care at discharge, Engage patient in therapeutic group addressing interpersonal concerns.  Short Term Goals: Engage patient in aftercare planning with referrals and resources, Increase social support, Increase ability to appropriately verbalize feelings,  Increase emotional regulation, Facilitate acceptance of mental health diagnosis and concerns, Facilitate patient progression through stages of change regarding substance use diagnoses and concerns, Identify triggers associated with mental health/substance abuse issues, and Increase skills for wellness and recovery  Therapeutic Interventions: Assess for all discharge needs, 1 to 1 time with Social worker, Explore available resources and support systems, Assess for adequacy in community support network, Educate family and significant other(s) on suicide prevention, Complete Psychosocial Assessment, Interpersonal group therapy.  Evaluation of Outcomes: Progressing   Progress in Treatment: Attending groups: Yes. Participating in groups: Yes. Taking medication as prescribed: Yes. Toleration medication: Yes. Family/Significant other contact made: No, will contact:  Patient declined  Patient understands diagnosis: Yes. Discussing patient identified problems/goals with staff: Yes. Medical problems stabilized or resolved: Yes. Denies suicidal/homicidal ideation: Yes. Issues/concerns per patient self-inventory: Yes. Other: none  New problem(s) identified: No, Describe:  none  New Short Term/Long Term Goal(s): Patient to work towards detox, elimination of symptoms of psychosis, medication management for mood stabilization; elimination of SI thoughts; development of comprehensive mental wellness/sobriety plan.  Patient Goals:  No  additional goals identified at this time. Patient to continue to work towards original goals identified in initial treatment team meeting. CSW will remain available to patient should they voice additional treatment goals.   Discharge Plan or Barriers: Patient lack adequate disposition/ supervision at [place]. CSW to provide case management and currently working with [employer] to mitigate funding barriers for placement at residential [facility] facility.   Reason for Continuation of  Hospitalization: Delusions  Medication stabilization  Estimated Length of Stay: 1-7 days    Scribe for Treatment Team: Larose Kells 05/27/2022 9:26 AM

## 2022-05-28 DIAGNOSIS — F203 Undifferentiated schizophrenia: Secondary | ICD-10-CM | POA: Diagnosis not present

## 2022-05-28 NOTE — Plan of Care (Signed)
Patient behaves appropriately at baseline, calm and cooperative; compliant with medication; denies SI HI AVH.  Pt is waiting for arrangements to be made for safe discharge.  Pt c/o "constipation" stating that the med given for GERD makes stool hard to pass.  Pt requests laxative and was given the med although also education was given about drinking more fluids to promote normal bowel function.  Pt verbalized understaning.  Continued monitoring via q 15 minute checks.   Problem: Education: Goal: Knowledge of General Education information will improve Description: Including pain rating scale, medication(s)/side effects and non-pharmacologic comfort measures Outcome: Progressing   Problem: Health Behavior/Discharge Planning: Goal: Ability to manage health-related needs will improve Outcome: Progressing   Problem: Coping: Goal: Level of anxiety will decrease Outcome: Progressing

## 2022-05-28 NOTE — Progress Notes (Signed)
Patient pleasant and cooperative. Medication compliant. No interaction with peers. Isolative to room. Denies any SI, HI, AVh.  Encouragement and support provided. Safety checks maintained. Medications given as prescribed. Pt receptive and remains safe on unit with q 15 min checks.

## 2022-05-28 NOTE — Plan of Care (Signed)

## 2022-05-28 NOTE — Plan of Care (Signed)
D- Patient alert and oriented. Patient presents in a pleasant mood reporting that he slept good last night and had no complaints to voice to this Probation officer. Patient denies SI, HI, AVH, and pain at this time. Patient also denies any signs/symptoms of depression and anxiety, he's just ready to go. Patient had no stated goals for today.  A- Scheduled medications administered to patient, per MD orders. Support and encouragement provided.  Routine safety checks conducted every 15 minutes.  Patient informed to notify staff with problems or concerns.  R- No adverse drug reactions noted. Patient contracts for safety at this time. Patient compliant with medications and treatment plan. Patient receptive, calm, and cooperative. Patient isolates to room, except for meals and medication. Patient remains safe at this time.  Problem: Education: Goal: Knowledge of General Education information will improve Description: Including pain rating scale, medication(s)/side effects and non-pharmacologic comfort measures Outcome: Progressing   Problem: Health Behavior/Discharge Planning: Goal: Ability to manage health-related needs will improve Outcome: Progressing   Problem: Clinical Measurements: Goal: Ability to maintain clinical measurements within normal limits will improve Outcome: Progressing Goal: Will remain free from infection Outcome: Progressing Goal: Diagnostic test results will improve Outcome: Progressing Goal: Respiratory complications will improve Outcome: Progressing Goal: Cardiovascular complication will be avoided Outcome: Progressing   Problem: Activity: Goal: Risk for activity intolerance will decrease Outcome: Progressing   Problem: Nutrition: Goal: Adequate nutrition will be maintained Outcome: Progressing   Problem: Coping: Goal: Level of anxiety will decrease Outcome: Progressing   Problem: Elimination: Goal: Will not experience complications related to urinary  retention Outcome: Progressing   Problem: Pain Managment: Goal: General experience of comfort will improve Outcome: Progressing   Problem: Safety: Goal: Ability to remain free from injury will improve Outcome: Progressing   Problem: Skin Integrity: Goal: Risk for impaired skin integrity will decrease Outcome: Progressing   Problem: Education: Goal: Knowledge of [place] General Education information/materials will improve Outcome: Progressing Goal: Emotional status will improve Outcome: Progressing Goal: Mental status will improve Outcome: Progressing Goal: Verbalization of understanding the information provided will improve Outcome: Progressing   Problem: Activity: Goal: Interest or engagement in activities will improve Outcome: Progressing Goal: Sleeping patterns will improve Outcome: Progressing   Problem: Coping: Goal: Ability to verbalize frustrations and anger appropriately will improve Outcome: Progressing Goal: Ability to demonstrate self-control will improve Outcome: Progressing   Problem: Health Behavior/Discharge Planning: Goal: Identification of resources available to assist in meeting health care needs will improve Outcome: Progressing Goal: Compliance with treatment plan for underlying cause of condition will improve Outcome: Progressing   Problem: Physical Regulation: Goal: Ability to maintain clinical measurements within normal limits will improve Outcome: Progressing   Problem: Safety: Goal: Periods of time without injury will increase Outcome: Progressing   Problem: Activity: Goal: Will verbalize the importance of balancing activity with adequate rest periods Outcome: Progressing   Problem: Education: Goal: Will be free of psychotic symptoms Outcome: Progressing Goal: Knowledge of the prescribed therapeutic regimen will improve Outcome: Progressing   Problem: Coping: Goal: Coping ability will improve Outcome: Progressing Goal: Will  verbalize feelings Outcome: Progressing   Problem: Health Behavior/Discharge Planning: Goal: Compliance with prescribed medication regimen will improve Outcome: Progressing   Problem: Nutritional: Goal: Ability to achieve adequate nutritional intake will improve Outcome: Progressing   Problem: Role Relationship: Goal: Ability to communicate needs accurately will improve Outcome: Progressing Goal: Ability to interact with others will improve Outcome: Progressing   Problem: Safety: Goal: Ability to redirect hostility and anger into  socially appropriate behaviors will improve Outcome: Progressing Goal: Ability to remain free from injury will improve Outcome: Progressing   Problem: Self-Care: Goal: Ability to participate in self-care as condition permits will improve Outcome: Progressing   Problem: Self-Concept: Goal: Will verbalize positive feelings about self Outcome: Progressing

## 2022-05-28 NOTE — Progress Notes (Signed)
[facility] MD Progress Note  05/28/2022 1:23 PM Travis Sandoval  MRN:  [id number] Subjective: Follow-up 42 year old man with schizophrenia.  Patient seen in his room.  As usual he is sitting on the side of his bed doing nothing but is awake.  Interacted reasonably appropriately when I came to see him.  Denied any complaints.  Denied any symptoms.  Patient has continued to take medicine to eat regularly to get his vital signs done and take care of basic hygiene.  Representative from the area agency is working with him on trying to get Social Security to release his check or to apply for low income housing. Principal Problem: Schizophrenia ([place]) Diagnosis: Principal Problem:   Schizophrenia ([place]) Active Problems:   Psychosis ([place])  Total Time spent with patient: 30 minutes  Past Psychiatric History: Past history of schizophrenia  Past Medical History:  Past Medical History:  Diagnosis Date   ADHD    Bipolar 1 disorder ([place])    Hepatitis C    Schizoaffective disorder ([place])    History reviewed. No pertinent surgical history. Family History:  Family History  Family history unknown: Yes   Family Psychiatric  History: See previous Social History:  Social History   Substance and Sexual Activity  Alcohol Use Not Currently     Social History   Substance and Sexual Activity  Drug Use Not Currently    Social History   Socioeconomic History   Marital status: Unknown    Spouse name: Not on file   Number of children: Not on file   Years of education: Not on file   Highest education level: Not on file  Occupational History   Not on file  Tobacco Use   Smoking status: Every Day    Packs/day: 0.25    Years: 15.00    Total pack years: 3.75    Types: Cigarettes   Smokeless tobacco: Not on file  Vaping Use   Vaping Use: Not on file  Substance and Sexual Activity   Alcohol use: Not Currently   Drug use: Not Currently   Sexual activity: Not Currently  Other Topics Concern   Not on file   Social History Narrative   Not on file   Social Determinants of Health   Financial Resource Strain: Not on file  Food Insecurity: Unknown (05/18/2022)   Hunger Vital Sign    Worried About Running Out of Food in the Last Year: Patient refused    [place] in the Last Year: Patient refused  Transportation Needs: Unknown (05/18/2022)   [place] - Hydrologist (Medical): Patient refused    Lack of Transportation (Non-Medical): Patient refused  Physical Activity: Not on file  Stress: Not on file  Social Connections: Not on file   Additional Social History:                         Sleep: Fair  Appetite:  Fair  Current Medications: Current Facility-Administered Medications  Medication Dose Route Frequency Provider Last Rate Last Admin   acetaminophen (TYLENOL) tablet 650 mg  650 mg Oral Q6H PRN [name], NP       alum & mag hydroxide-simeth (MAALOX/MYLANTA) 200-200-20 MG/5ML suspension 30 mL  30 mL Oral Q4H PRN [name], NP   30 mL at 05/22/22 0816   cloZAPine (CLOZARIL) tablet 250 mg  250 mg Oral QHS Anjel Perfetti, [name], MD   250 mg at 05/27/22 2137  famotidine (PEPCID) tablet 20 mg  20 mg Oral BID Jaecion Dempster T, MD   20 mg at 05/28/22 0749   hydrOXYzine (ATARAX) tablet 50 mg  50 mg Oral Q6H PRN Laylanie Kruczek T, MD   50 mg at 05/17/22 0944   magnesium hydroxide (MILK OF MAGNESIA) suspension 30 mL  30 mL Oral Daily PRN [name], NP   30 mL at 05/27/22 2138   nicotine polacrilex (NICORETTE) gum 2 mg  2 mg Oral PRN Odies Desa T, MD   2 mg at 04/23/22 2200   [START ON 05/30/2022] paliperidone (INVEGA SUSTENNA) injection 156 mg  156 mg Intramuscular Q28 days Romari Gasparro T, MD       ziprasidone (GEODON) injection 20 mg  20 mg Intramuscular Q12H PRN [name], MD        Lab Results: No results found for this or any previous visit (from the past 40 hour(s)).  Blood Alcohol level:  Lab Results  Component Value  Date   ETH <10 47/82/9562    Metabolic Disorder Labs: Lab Results  Component Value Date   HGBA1C 5.5 04/24/2022   MPG 111 04/24/2022   No results found for: "PROLACTIN" Lab Results  Component Value Date   CHOL 220 (H) 04/24/2022   TRIG 700 (H) 04/24/2022   HDL 35 (L) 04/24/2022   CHOLHDL 6.3 04/24/2022   VLDL UNABLE TO CALCULATE IF TRIGLYCERIDE OVER 400 mg/dL 04/24/2022   LDLCALC UNABLE TO CALCULATE IF TRIGLYCERIDE OVER 400 mg/dL 04/24/2022    Physical Findings: AIMS: Facial and Oral Movements Muscles of Facial Expression: None, normal Lips and Perioral Area: None, normal Jaw: None, normal Tongue: None, normal,Extremity Movements Upper (arms, wrists, hands, fingers): None, normal Lower (legs, knees, ankles, toes): None, normal, Trunk Movements Neck, shoulders, hips: None, normal, Overall Severity Severity of abnormal movements (highest score from questions above): None, normal Incapacitation due to abnormal movements: None, normal Patient's awareness of abnormal movements (rate only patient's report): No Awareness, Dental Status Current problems with teeth and/or dentures?: No Does patient usually wear dentures?: No  CIWA:    COWS:     Musculoskeletal: Strength & Muscle Tone: within normal limits Gait & Station: normal Patient leans: N/A  Psychiatric Specialty Exam:  Presentation  General Appearance:  Disheveled  Eye Contact: Minimal  Speech: Pressured  Speech Volume: Normal  Handedness: Right   Mood and Affect  Mood: Euphoric; Irritable  Affect: Inappropriate   Thought Process  Thought Processes: Disorganized  Descriptions of Associations:Loose  Orientation:Partial  Thought Content:Illogical; Scattered  History of Schizophrenia/Schizoaffective disorder:No data recorded Duration of Psychotic Symptoms:No data recorded Hallucinations:No data recorded Ideas of Reference:Delusions; Paranoia  Suicidal Thoughts:No data recorded Homicidal  Thoughts:No data recorded  Sensorium  Memory: Immediate Poor; Recent Poor; Remote Poor  Judgment: Poor  Insight: Poor   Executive Functions  Concentration: Fair  Attention Span: Fair  Recall: Poor  Fund of Knowledge: Poor  Language: Poor   Psychomotor Activity  Psychomotor Activity:No data recorded  Assets  Assets: Desire for Improvement; Social Support   Sleep  Sleep:No data recorded   Physical Exam: Physical Exam Vitals and nursing note reviewed.  Constitutional:      Appearance: Normal appearance.  HENT:     Head: Normocephalic and atraumatic.     Mouth/Throat:     Pharynx: Oropharynx is clear.  Eyes:     Pupils: Pupils are equal, round, and reactive to light.  Cardiovascular:     Rate and Rhythm: Normal rate and regular  rhythm.  Pulmonary:     Effort: Pulmonary effort is normal.     Breath sounds: Normal breath sounds.  Abdominal:     General: Abdomen is flat.     Palpations: Abdomen is soft.  Musculoskeletal:        General: Normal range of motion.  Skin:    General: Skin is warm and dry.  Neurological:     General: No focal deficit present.     Mental Status: He is alert. Mental status is at baseline.  Psychiatric:        Attention and Perception: He is inattentive.        Mood and Affect: Affect is blunt.        Speech: Speech is delayed.        Behavior: Behavior is slowed.    Review of Systems  Constitutional: Negative.   HENT: Negative.    Eyes: Negative.   Respiratory: Negative.    Cardiovascular: Negative.   Gastrointestinal: Negative.   Musculoskeletal: Negative.   Skin: Negative.   Neurological: Negative.   Psychiatric/Behavioral: Negative.     Blood pressure 123/79, pulse 94, temperature 98 F (36.7 C), temperature source Oral, resp. rate 18, height 5\' 9"  (1.753 m), weight 96.4 kg, SpO2 98 %. Body mass index is 31.38 kg/m.   Treatment Plan Summary: Medication management and Plan no change to medication.   Encourage patient to let us know if he has any concerns or problems.  Seems to be physically and mentally stable right now and awaiting discharge planning  Alethia Berthold, MD 05/28/2022, 1:23 PM

## 2022-05-28 NOTE — Group Note (Signed)
[facility] LCSW Group Therapy Note    Group Date: 05/28/2022 Start Time: 1400 End Time: 1500  Type of Therapy and Topic:  Group Therapy:  Overcoming Obstacles  Participation Level:  [facility] PARTICIPATION LEVEL: Did Not Attend   Description of Group:   In this group patients will be encouraged to explore what they see as obstacles to their own wellness and recovery. They will be guided to discuss their thoughts, feelings, and behaviors related to these obstacles. The group will process together ways to cope with barriers, with attention given to specific choices patients can make. Each patient will be challenged to identify changes they are motivated to make in order to overcome their obstacles. This group will be process-oriented, with patients participating in exploration of their own experiences as well as giving and receiving support and challenge from other group members.  Therapeutic Goals: 1. Patient will identify personal and current obstacles as they relate to admission. 2. Patient will identify barriers that currently interfere with their wellness or overcoming obstacles.  3. Patient will identify feelings, thought process and behaviors related to these barriers. 4. Patient will identify two changes they are willing to make to overcome these obstacles:    Summary of Patient Progress X   Therapeutic Modalities:   Cognitive Behavioral Therapy Solution Focused Therapy Motivational Interviewing Relapse Prevention Therapy   Travis Sterba R Abigayle Wilinski, LCSW 

## 2022-05-29 DIAGNOSIS — F203 Undifferentiated schizophrenia: Secondary | ICD-10-CM | POA: Diagnosis not present

## 2022-05-29 LAB — CBC WITH DIFFERENTIAL/PLATELET
Abs Immature Granulocytes: 0.03 10*3/uL (ref 0.00–0.07)
Basophils Absolute: 0.1 10*3/uL (ref 0.0–0.1)
Basophils Relative: 1 %
Eosinophils Absolute: 0.1 10*3/uL (ref 0.0–0.5)
Eosinophils Relative: 2 %
HCT: 41.3 % (ref 39.0–52.0)
Hemoglobin: 13.9 g/dL (ref 13.0–17.0)
Immature Granulocytes: 0 %
Lymphocytes Relative: 33 %
Lymphs Abs: 2.5 10*3/uL (ref 0.7–4.0)
MCH: 30.5 pg (ref 26.0–34.0)
MCHC: 33.7 g/dL (ref 30.0–36.0)
MCV: 90.8 fL (ref 80.0–100.0)
Monocytes Absolute: 0.7 10*3/uL (ref 0.1–1.0)
Monocytes Relative: 9 %
Neutro Abs: 4.3 10*3/uL (ref 1.7–7.7)
Neutrophils Relative %: 55 %
Platelets: 191 10*3/uL (ref 150–400)
RBC: 4.55 MIL/uL (ref 4.22–5.81)
RDW: 12.2 % (ref 11.5–15.5)
WBC: 7.8 10*3/uL (ref 4.0–10.5)
nRBC: 0 % (ref 0.0–0.2)

## 2022-05-29 NOTE — Progress Notes (Signed)
Patient calm and pleasant during assessment denying SI/HI/AVH. Pt observed interacting appropriately with staff and peers on the unit. Pt compliant with medication administration per MD orders. Pt given education, support, and encouragement to be acitve in his treatment plan. Pt being monitored Q 15 minutes for safety per unit protocol, remains safe on the unit    

## 2022-05-29 NOTE — Plan of Care (Signed)
Patient calm and isolates. Minimal interactions with staff & peers. No issues verbalized. Denies SI,HI and AVH. Compliant with medications. Appetite and energy level good. Support and encouragement given.

## 2022-05-29 NOTE — Group Note (Signed)
[facility] LCSW Group Therapy Note   Group Date: 05/29/2022 Start Time: 1310 End Time: 1400   Type of Therapy/Topic:  Group Therapy:  Emotion Regulation  Participation Level:  Did Not Attend   Mood:  Description of Group:    The purpose of this group is to assist patients in learning to regulate negative emotions and experience positive emotions. Patients will be guided to discuss ways in which they have been vulnerable to their negative emotions. These vulnerabilities will be juxtaposed with experiences of positive emotions or situations, and patients challenged to use positive emotions to combat negative ones. Special emphasis will be placed on coping with negative emotions in conflict situations, and patients will process healthy conflict resolution skills.  Therapeutic Goals: Patient will identify two positive emotions or experiences to reflect on in order to balance out negative emotions:  Patient will label two or more emotions that they find the most difficult to experience:  Patient will be able to demonstrate positive conflict resolution skills through discussion or role plays:   Summary of Patient Progress: Patient did not attend group despite encouragement from this clinician.  Therapeutic Modalities:   Cognitive Behavioral Therapy Feelings Identification Dialectical Behavioral Therapy   Jarae Panas J Demarius Archila, LCSW 

## 2022-05-29 NOTE — Progress Notes (Signed)
[facility] MD Progress Note  05/29/2022 4:44 PM Travis Sandoval  MRN:  [id number] Subjective: Follow-up 42 year old man with schizophrenia.  No change presentation.  Behavior calm and isolated.  Still has delusional thinking if addressed.  No behavior problems no aggression no medical issues. Principal Problem: Schizophrenia ([place]) Diagnosis: Principal Problem:   Schizophrenia ([place]) Active Problems:   Psychosis ([place])  Total Time spent with patient: 30 minutes  Past Psychiatric History: Past history of schizophrenia  Past Medical History:  Past Medical History:  Diagnosis Date   ADHD    Bipolar 1 disorder ([place])    Hepatitis C    Schizoaffective disorder ([place])    History reviewed. No pertinent surgical history. Family History:  Family History  Family history unknown: Yes   Family Psychiatric  History: See previous Social History:  Social History   Substance and Sexual Activity  Alcohol Use Not Currently     Social History   Substance and Sexual Activity  Drug Use Not Currently    Social History   Socioeconomic History   Marital status: Unknown    Spouse name: Not on file   Number of children: Not on file   Years of education: Not on file   Highest education level: Not on file  Occupational History   Not on file  Tobacco Use   Smoking status: Every Day    Packs/day: 0.25    Years: 15.00    Total pack years: 3.75    Types: Cigarettes   Smokeless tobacco: Not on file  Vaping Use   Vaping Use: Not on file  Substance and Sexual Activity   Alcohol use: Not Currently   Drug use: Not Currently   Sexual activity: Not Currently  Other Topics Concern   Not on file  Social History Narrative   Not on file   Social Determinants of Health   Financial Resource Strain: Not on file  Food Insecurity: Unknown (05/18/2022)   Hunger Vital Sign    Worried About Running Out of Food in the Last Year: Patient refused    [place] in the Last Year: Patient refused   Transportation Needs: Unknown (05/18/2022)   [place] - Hydrologist (Medical): Patient refused    Lack of Transportation (Non-Medical): Patient refused  Physical Activity: Not on file  Stress: Not on file  Social Connections: Not on file   Additional Social History:                         Sleep: Fair  Appetite:  Fair  Current Medications: Current Facility-Administered Medications  Medication Dose Route Frequency Provider Last Rate Last Admin   acetaminophen (TYLENOL) tablet 650 mg  650 mg Oral Q6H PRN [name], NP       alum & mag hydroxide-simeth (MAALOX/MYLANTA) 200-200-20 MG/5ML suspension 30 mL  30 mL Oral Q4H PRN [name], NP   30 mL at 05/22/22 0816   cloZAPine (CLOZARIL) tablet 250 mg  250 mg Oral QHS Carrell Rahmani T, MD   250 mg at 05/28/22 2107   famotidine (PEPCID) tablet 20 mg  20 mg Oral BID Reef Achterberg T, MD   20 mg at 05/29/22 0753   hydrOXYzine (ATARAX) tablet 50 mg  50 mg Oral Q6H PRN Norvil Martensen, [name], MD   50 mg at 05/17/22 0944   magnesium hydroxide (MILK OF MAGNESIA) suspension 30 mL  30 mL Oral Daily PRN Waldon Merl  F, NP   30 mL at 05/27/22 2138   nicotine polacrilex (NICORETTE) gum 2 mg  2 mg Oral PRN Antoinetta Berrones, [name], MD   2 mg at 04/23/22 2200   [START ON 05/30/2022] paliperidone (INVEGA SUSTENNA) injection 156 mg  156 mg Intramuscular Q28 days Sherlene Rickel, [name], MD       ziprasidone (GEODON) injection 20 mg  20 mg Intramuscular Q12H PRN [name], MD        Lab Results:  Results for orders placed or performed during the hospital encounter of 04/16/22 (from the past 48 hour(s))  CBC with Differential/Platelet     Status: None   Collection Time: 05/29/22  9:55 AM  Result Value Ref Range   WBC 7.8 4.0 - 10.5 K/uL   RBC 4.55 4.22 - 5.81 MIL/uL   Hemoglobin 13.9 13.0 - 17.0 g/dL   HCT 41.3 39.0 - 52.0 %   MCV 90.8 80.0 - 100.0 fL   MCH 30.5 26.0 - 34.0 pg   MCHC 33.7 30.0 - 36.0 g/dL   RDW  12.2 11.5 - 15.5 %   Platelets 191 150 - 400 K/uL   nRBC 0.0 0.0 - 0.2 %   Neutrophils Relative % 55 %   Neutro Abs 4.3 1.7 - 7.7 K/uL   Lymphocytes Relative 33 %   Lymphs Abs 2.5 0.7 - 4.0 K/uL   Monocytes Relative 9 %   Monocytes Absolute 0.7 0.1 - 1.0 K/uL   Eosinophils Relative 2 %   Eosinophils Absolute 0.1 0.0 - 0.5 K/uL   Basophils Relative 1 %   Basophils Absolute 0.1 0.0 - 0.1 K/uL   Immature Granulocytes 0 %   Abs Immature Granulocytes 0.03 0.00 - 0.07 K/uL    Comment: Performed at [facility], [address] [place], [place] [address]    Blood Alcohol level:  Lab Results  Component Value Date   [facility] <10 76/19/5093    Metabolic Disorder Labs: Lab Results  Component Value Date   HGBA1C 5.5 04/24/2022   MPG 111 04/24/2022   No results found for: "PROLACTIN" Lab Results  Component Value Date   CHOL 220 (H) 04/24/2022   TRIG 700 (H) 04/24/2022   HDL 35 (L) 04/24/2022   CHOLHDL 6.3 04/24/2022   VLDL UNABLE TO CALCULATE IF TRIGLYCERIDE OVER 400 mg/dL 04/24/2022   LDLCALC UNABLE TO CALCULATE IF TRIGLYCERIDE OVER 400 mg/dL 04/24/2022    Physical Findings: AIMS: Facial and Oral Movements Muscles of Facial Expression: None, normal Lips and Perioral Area: None, normal Jaw: None, normal Tongue: None, normal,Extremity Movements Upper (arms, wrists, hands, fingers): None, normal Lower (legs, knees, ankles, toes): None, normal, Trunk Movements Neck, shoulders, hips: None, normal, Overall Severity Severity of abnormal movements (highest score from questions above): None, normal Incapacitation due to abnormal movements: None, normal Patient's awareness of abnormal movements (rate only patient's report): No Awareness, Dental Status Current problems with teeth and/or dentures?: No Does patient usually wear dentures?: No  CIWA:    COWS:     Musculoskeletal: Strength & Muscle Tone: within normal limits Gait & Station: normal Patient leans:  N/A  Psychiatric Specialty Exam:  Presentation  General Appearance:  Disheveled  Eye Contact: Minimal  Speech: Pressured  Speech Volume: Normal  Handedness: Right   Mood and Affect  Mood: Euphoric; Irritable  Affect: Inappropriate   Thought Process  Thought Processes: Disorganized  Descriptions of Associations:Loose  Orientation:Partial  Thought Content:Illogical; Scattered  History of Schizophrenia/Schizoaffective disorder:No data recorded Duration of Psychotic  Symptoms:No data recorded Hallucinations:No data recorded Ideas of Reference:Delusions; Paranoia  Suicidal Thoughts:No data recorded Homicidal Thoughts:No data recorded  Sensorium  Memory: Immediate Poor; Recent Poor; Remote Poor  Judgment: Poor  Insight: Poor   Executive Functions  Concentration: Fair  Attention Span: Fair  Recall: Poor  Fund of Knowledge: Poor  Language: Poor   Psychomotor Activity  Psychomotor Activity:No data recorded  Assets  Assets: Desire for Improvement; Social Support   Sleep  Sleep:No data recorded   Physical Exam: Physical Exam Vitals and nursing note reviewed.  Constitutional:      Appearance: Normal appearance.  HENT:     Head: Normocephalic and atraumatic.     Mouth/Throat:     Pharynx: Oropharynx is clear.  Eyes:     Pupils: Pupils are equal, round, and reactive to light.  Cardiovascular:     Rate and Rhythm: Normal rate and regular rhythm.  Pulmonary:     Effort: Pulmonary effort is normal.     Breath sounds: Normal breath sounds.  Abdominal:     General: Abdomen is flat.     Palpations: Abdomen is soft.  Musculoskeletal:        General: Normal range of motion.  Skin:    General: Skin is warm and dry.  Neurological:     General: No focal deficit present.     Mental Status: He is alert. Mental status is at baseline.  Psychiatric:        Attention and Perception: He is inattentive.        Mood and Affect: Mood  normal. Affect is blunt.        Speech: Speech is delayed.        Behavior: Behavior is slowed.        Thought Content: Thought content is delusional.    Review of Systems  Constitutional: Negative.   HENT: Negative.    Eyes: Negative.   Respiratory: Negative.    Cardiovascular: Negative.   Gastrointestinal: Negative.   Musculoskeletal: Negative.   Skin: Negative.   Neurological: Negative.   Psychiatric/Behavioral: Negative.     Blood pressure 106/87, pulse 99, temperature 98.8 F (37.1 C), resp. rate 18, height 5\' 9"  (1.753 m), weight 96.4 kg, SpO2 96 %. Body mass index is 31.38 kg/m.   Treatment Plan Summary: Medication management and Plan no change to medication.  No change to treatment plan.  Still focused on discharge  Alethia Berthold, MD 05/29/2022, 4:44 PM

## 2022-05-30 DIAGNOSIS — F203 Undifferentiated schizophrenia: Secondary | ICD-10-CM | POA: Diagnosis not present

## 2022-05-30 NOTE — Plan of Care (Signed)
  Problem: Education: Goal: Knowledge of General Education information will improve Description: Including pain rating scale, medication(s)/side effects and non-pharmacologic comfort measures Outcome: Progressing   Problem: Health Behavior/Discharge Planning: Goal: Ability to manage health-related needs will improve Outcome: Progressing   Problem: Clinical Measurements: Goal: Ability to maintain clinical measurements within normal limits will improve Outcome: Progressing Goal: Will remain free from infection Outcome: Progressing Goal: Diagnostic test results will improve Outcome: Progressing Goal: Respiratory complications will improve Outcome: Progressing Goal: Cardiovascular complication will be avoided Outcome: Progressing   Problem: Activity: Goal: Risk for activity intolerance will decrease Outcome: Progressing   Problem: Nutrition: Goal: Adequate nutrition will be maintained Outcome: Progressing   Problem: Coping: Goal: Level of anxiety will decrease Outcome: Progressing   Problem: Elimination: Goal: Will not experience complications related to urinary retention Outcome: Progressing   Problem: Pain Managment: Goal: General experience of comfort will improve Outcome: Progressing   Problem: Safety: Goal: Ability to remain free from injury will improve Outcome: Progressing   Problem: Skin Integrity: Goal: Risk for impaired skin integrity will decrease Outcome: Progressing   Problem: Education: Goal: Knowledge of [facility] Education information/materials will improve Outcome: Progressing Goal: Emotional status will improve Outcome: Progressing Goal: Mental status will improve Outcome: Progressing Goal: Verbalization of understanding the information provided will improve Outcome: Progressing   Problem: Activity: Goal: Interest or engagement in activities will improve Outcome: Progressing Goal: Sleeping patterns will improve Outcome:  Progressing   Problem: Coping: Goal: Ability to verbalize frustrations and anger appropriately will improve Outcome: Progressing Goal: Ability to demonstrate self-control will improve Outcome: Progressing   Problem: Health Behavior/Discharge Planning: Goal: Identification of resources available to assist in meeting health care needs will improve Outcome: Progressing Goal: Compliance with treatment plan for underlying cause of condition will improve Outcome: Progressing   Problem: Physical Regulation: Goal: Ability to maintain clinical measurements within normal limits will improve Outcome: Progressing   Problem: Safety: Goal: Periods of time without injury will increase Outcome: Progressing   Problem: Activity: Goal: Will verbalize the importance of balancing activity with adequate rest periods Outcome: Progressing   Problem: Education: Goal: Will be free of psychotic symptoms Outcome: Progressing Goal: Knowledge of the prescribed therapeutic regimen will improve Outcome: Progressing   Problem: Coping: Goal: Coping ability will improve Outcome: Progressing Goal: Will verbalize feelings Outcome: Progressing   Problem: Health Behavior/Discharge Planning: Goal: Compliance with prescribed medication regimen will improve Outcome: Progressing   Problem: Nutritional: Goal: Ability to achieve adequate nutritional intake will improve Outcome: Progressing   Problem: Role Relationship: Goal: Ability to communicate needs accurately will improve Outcome: Progressing Goal: Ability to interact with others will improve Outcome: Progressing   Problem: Safety: Goal: Ability to redirect hostility and anger into socially appropriate behaviors will improve Outcome: Progressing Goal: Ability to remain free from injury will improve Outcome: Progressing   Problem: Self-Care: Goal: Ability to participate in self-care as condition permits will improve Outcome: Progressing    Problem: Self-Concept: Goal: Will verbalize positive feelings about self Outcome: Progressing   

## 2022-05-30 NOTE — Progress Notes (Signed)
[facility] MD Progress Note  05/30/2022 11:33 AM Travis Sandoval  MRN:  [id number] Subjective: Patient seen for follow-up.  Continues to stay in his room most of the time.  No complaints.  Takes his medicine.  Physically stable. Principal Problem: Schizophrenia ([place]) Diagnosis: Principal Problem:   Schizophrenia ([place]) Active Problems:   Psychosis ([place])  Total Time spent with patient: 30 minutes  Past Psychiatric History: Past history of schizophrenia  Past Medical History:  Past Medical History:  Diagnosis Date   ADHD    Bipolar 1 disorder ([place])    Hepatitis C    Schizoaffective disorder ([place])    History reviewed. No pertinent surgical history. Family History:  Family History  Family history unknown: Yes   Family Psychiatric  History: See previous Social History:  Social History   Substance and Sexual Activity  Alcohol Use Not Currently     Social History   Substance and Sexual Activity  Drug Use Not Currently    Social History   Socioeconomic History   Marital status: Unknown    Spouse name: Not on file   Number of children: Not on file   Years of education: Not on file   Highest education level: Not on file  Occupational History   Not on file  Tobacco Use   Smoking status: Every Day    Packs/day: 0.25    Years: 15.00    Total pack years: 3.75    Types: Cigarettes   Smokeless tobacco: Not on file  Vaping Use   Vaping Use: Not on file  Substance and Sexual Activity   Alcohol use: Not Currently   Drug use: Not Currently   Sexual activity: Not Currently  Other Topics Concern   Not on file  Social History Narrative   Not on file   Social Determinants of Health   Financial Resource Strain: Not on file  Food Insecurity: Unknown (05/18/2022)   Hunger Vital Sign    Worried About Running Out of Food in the Last Year: Patient refused    [place] in the Last Year: Patient refused  Transportation Needs: Unknown (05/18/2022)   [place] - Armed forces logistics/support/administrative officer (Medical): Patient refused    Lack of Transportation (Non-Medical): Patient refused  Physical Activity: Not on file  Stress: Not on file  Social Connections: Not on file   Additional Social History:                         Sleep: Fair  Appetite:  Fair  Current Medications: Current Facility-Administered Medications  Medication Dose Route Frequency Provider Last Rate Last Admin   acetaminophen (TYLENOL) tablet 650 mg  650 mg Oral Q6H PRN [name], NP       alum & mag hydroxide-simeth (MAALOX/MYLANTA) 200-200-20 MG/5ML suspension 30 mL  30 mL Oral Q4H PRN [name], NP   30 mL at 05/22/22 0816   cloZAPine (CLOZARIL) tablet 250 mg  250 mg Oral QHS Blayke Pinera T, MD   250 mg at 05/29/22 2122   famotidine (PEPCID) tablet 20 mg  20 mg Oral BID Khylon Davies T, MD   20 mg at 05/30/22 0935   hydrOXYzine (ATARAX) tablet 50 mg  50 mg Oral Q6H PRN Lorea Kupfer, [name], MD   50 mg at 05/17/22 0944   magnesium hydroxide (MILK OF MAGNESIA) suspension 30 mL  30 mL Oral Daily PRN [name], NP   30  mL at 05/27/22 2138   nicotine polacrilex (NICORETTE) gum 2 mg  2 mg Oral PRN Frona Yost, [name], MD   2 mg at 04/23/22 2200   paliperidone (INVEGA SUSTENNA) injection 156 mg  156 mg Intramuscular Q28 days Jackee Glasner, [name], MD   156 mg at 05/30/22 1100   ziprasidone (GEODON) injection 20 mg  20 mg Intramuscular Q12H PRN [name], MD        Lab Results:  Results for orders placed or performed during the hospital encounter of 04/16/22 (from the past 48 hour(s))  CBC with Differential/Platelet     Status: None   Collection Time: 05/29/22  9:55 AM  Result Value Ref Range   WBC 7.8 4.0 - 10.5 K/uL   RBC 4.55 4.22 - 5.81 MIL/uL   Hemoglobin 13.9 13.0 - 17.0 g/dL   HCT 41.3 39.0 - 52.0 %   MCV 90.8 80.0 - 100.0 fL   MCH 30.5 26.0 - 34.0 pg   MCHC 33.7 30.0 - 36.0 g/dL   RDW 12.2 11.5 - 15.5 %   Platelets 191 150 - 400 K/uL   nRBC 0.0 0.0 - 0.2 %    Neutrophils Relative % 55 %   Neutro Abs 4.3 1.7 - 7.7 K/uL   Lymphocytes Relative 33 %   Lymphs Abs 2.5 0.7 - 4.0 K/uL   Monocytes Relative 9 %   Monocytes Absolute 0.7 0.1 - 1.0 K/uL   Eosinophils Relative 2 %   Eosinophils Absolute 0.1 0.0 - 0.5 K/uL   Basophils Relative 1 %   Basophils Absolute 0.1 0.0 - 0.1 K/uL   Immature Granulocytes 0 %   Abs Immature Granulocytes 0.03 0.00 - 0.07 K/uL    Comment: Performed at [facility], [address] [place],  [address]    Blood Alcohol level:  Lab Results  Component Value Date   [facility] <10 24/23/5361    Metabolic Disorder Labs: Lab Results  Component Value Date   HGBA1C 5.5 04/24/2022   MPG 111 04/24/2022   No results found for: "PROLACTIN" Lab Results  Component Value Date   CHOL 220 (H) 04/24/2022   TRIG 700 (H) 04/24/2022   HDL 35 (L) 04/24/2022   CHOLHDL 6.3 04/24/2022   VLDL UNABLE TO CALCULATE IF TRIGLYCERIDE OVER 400 mg/dL 04/24/2022   LDLCALC UNABLE TO CALCULATE IF TRIGLYCERIDE OVER 400 mg/dL 04/24/2022    Physical Findings: AIMS: Facial and Oral Movements Muscles of Facial Expression: None, normal Lips and Perioral Area: None, normal Jaw: None, normal Tongue: None, normal,Extremity Movements Upper (arms, wrists, hands, fingers): None, normal Lower (legs, knees, ankles, toes): None, normal, Trunk Movements Neck, shoulders, hips: None, normal, Overall Severity Severity of abnormal movements (highest score from questions above): None, normal Incapacitation due to abnormal movements: None, normal Patient's awareness of abnormal movements (rate only patient's report): No Awareness, Dental Status Current problems with teeth and/or dentures?: No Does patient usually wear dentures?: No  CIWA:    COWS:     Musculoskeletal: Strength & Muscle Tone: within normal limits Gait & Station: normal Patient leans: N/A  Psychiatric Specialty Exam:  Presentation  General Appearance:  Disheveled  Eye  Contact: Minimal  Speech: Pressured  Speech Volume: Normal  Handedness: Right   Mood and Affect  Mood: Euphoric; Irritable  Affect: Inappropriate   Thought Process  Thought Processes: Disorganized  Descriptions of Associations:Loose  Orientation:Partial  Thought Content:Illogical; Scattered  History of Schizophrenia/Schizoaffective disorder:No data recorded Duration of Psychotic Symptoms:No data recorded Hallucinations:No data  recorded Ideas of Reference:Delusions; Paranoia  Suicidal Thoughts:No data recorded Homicidal Thoughts:No data recorded  Sensorium  Memory: Immediate Poor; Recent Poor; Remote Poor  Judgment: Poor  Insight: Poor   Executive Functions  Concentration: Fair  Attention Span: Fair  Recall: Poor  Fund of Knowledge: Poor  Language: Poor   Psychomotor Activity  Psychomotor Activity:No data recorded  Assets  Assets: Desire for Improvement; Social Support   Sleep  Sleep:No data recorded   Physical Exam: Physical Exam Vitals and nursing note reviewed.  Constitutional:      Appearance: Normal appearance.  HENT:     Head: Normocephalic and atraumatic.     Mouth/Throat:     Pharynx: Oropharynx is clear.  Eyes:     Pupils: Pupils are equal, round, and reactive to light.  Cardiovascular:     Rate and Rhythm: Normal rate and regular rhythm.  Pulmonary:     Effort: Pulmonary effort is normal.     Breath sounds: Normal breath sounds.  Abdominal:     General: Abdomen is flat.     Palpations: Abdomen is soft.  Musculoskeletal:        General: Normal range of motion.  Skin:    General: Skin is warm and dry.  Neurological:     General: No focal deficit present.     Mental Status: He is alert. Mental status is at baseline.  Psychiatric:        Attention and Perception: He is inattentive.        Mood and Affect: Mood normal. Affect is blunt.        Speech: Speech is delayed.        Behavior: Behavior is slowed.         Thought Content: Thought content normal.    Review of Systems  Constitutional: Negative.   HENT: Negative.    Eyes: Negative.   Respiratory: Negative.    Cardiovascular: Negative.   Gastrointestinal: Negative.   Musculoskeletal: Negative.   Skin: Negative.   Neurological: Negative.   Psychiatric/Behavioral: Negative.     Blood pressure 114/82, pulse 97, temperature 97.8 F (36.6 C), temperature source Oral, resp. rate 18, height 5\' 9"  (1.753 m), weight 96.4 kg, SpO2 100 %. Body mass index is 31.38 kg/m.   Treatment Plan Summary: Medication management and Plan although patient does not have a guardian team has discussed this patient's discharge plan and it really seems that he would be a very poor fit for a shelter environment and would be at high risk for being taken advantage of self-harm and rehospitalization.  What is standing in the way of a better plan is financing.  We will continue hospital stay for now.  Encourage him to get out of bed and interact with others and let us know when he needs something.  Alethia Berthold, MD 05/30/2022, 11:33 AM

## 2022-05-30 NOTE — Group Note (Signed)
LCSW Group Therapy Note  Group Date: 05/30/2022 Start Time: 1300 End Time: 1400   Type of Therapy and Topic:  Group Therapy - Healthy vs Unhealthy Coping Skills  Participation Level:  Did Not Attend   Description of Group The focus of this group was to determine what unhealthy coping techniques typically are used by group members and what healthy coping techniques would be helpful in coping with various problems. Patients were guided in becoming aware of the differences between healthy and unhealthy coping techniques. Patients were asked to identify 2-3 healthy coping skills they would like to learn to use more effectively.  Therapeutic Goals Patients learned that coping is what human beings do all day long to deal with various situations in their lives Patients defined and discussed healthy vs unhealthy coping techniques Patients identified their preferred coping techniques and identified whether these were healthy or unhealthy Patients determined 2-3 healthy coping skills they would like to become more familiar with and use more often. Patients provided support and ideas to each other   Summary of Patient Progress:   Patient did not attend group despite encouraged participation.   Therapeutic Modalities Cognitive Behavioral Therapy Motivational Interviewing  Larose Kells 05/30/2022  3:47 PM

## 2022-05-30 NOTE — Progress Notes (Signed)
D- Patient alert and oriented x 2. Affect flat/mood depressed. Denies SI/ HI/ AVH. He denies pain. He has no complaints. A- Scheduled medications administered to patient, per MD orders. Support and encouragement provided.  Routine safety checks conducted every 15 minutes.  Patient informed to notify staff with problems or concerns. R- No adverse drug reactions noted.Patient compliant with medications and treatment plan. Patient receptive, calm, and cooperative. Patient isolates to room except for meals. Patient contracts for safety and remains safe on the unit at this time.

## 2022-05-30 NOTE — Progress Notes (Signed)
Patient calm and pleasant during assessment denying SI/HI/AVH. Pt observed interacting appropriately with staff and peers on the unit. Pt compliant with medication administration per MD orders. Pt given education, support, and encouragement to be acitve in his treatment plan. Pt being monitored Q 15 minutes for safety per unit protocol, remains safe on the unit    

## 2022-05-30 NOTE — Progress Notes (Signed)
D- Patient alert and oriented. Affect/mood. Denies SI, HI, AVH, and pain. Quotes. Goal. How did they do with achieving previous goal. A- Scheduled medications administered to patient, per MD orders. Support and encouragement provided.  Routine safety checks conducted every 15 minutes.  Patient informed to notify staff with problems or concerns. R- No adverse drug reactions noted. Patient contracts for safety at this time. Patient compliant with medications and treatment plan. Patient receptive, calm, and cooperative. Patient interacts well with others on the unit.  Patient remains safe at this time. 

## 2022-05-30 NOTE — BHH Group Notes (Signed)
[place] Group Notes:  (Nursing/MHT/Case Management/Adjunct)  Date:  05/30/2022  Time:  8:41 PM  Type of Therapy:   Wrap up  Participation Level:  Active  Participation Quality:  Appropriate  Affect:  Appropriate  Cognitive:  Alert  Insight:  Good  Engagement in Group:  Engaged and his goal is to be D/C  Modes of Intervention:  Support  Summary of Progress/Problems:  Travis Sandoval 05/30/2022, 8:41 PM

## 2022-05-31 DIAGNOSIS — F203 Undifferentiated schizophrenia: Secondary | ICD-10-CM | POA: Diagnosis not present

## 2022-05-31 NOTE — Progress Notes (Signed)
[facility] MD Progress Note  05/31/2022 4:25 PM Travis Sandoval  MRN:  [id number] Subjective: Patient was schizophrenia.  Follow-up.  Patient appears to be completely stable.  No change in behavior. Principal Problem: Schizophrenia ([place]) Diagnosis: Principal Problem:   Schizophrenia ([place]) Active Problems:   Psychosis ([place])  Total Time spent with patient: 20 minutes  Past Psychiatric History: Schizophrenia  Past Medical History:  Past Medical History:  Diagnosis Date   ADHD    Bipolar 1 disorder ([place])    Hepatitis C    Schizoaffective disorder ([place])    History reviewed. No pertinent surgical history. Family History:  Family History  Family history unknown: Yes   Family Psychiatric  History: See previous Social History:  Social History   Substance and Sexual Activity  Alcohol Use Not Currently     Social History   Substance and Sexual Activity  Drug Use Not Currently    Social History   Socioeconomic History   Marital status: Unknown    Spouse name: Not on file   Number of children: Not on file   Years of education: Not on file   Highest education level: Not on file  Occupational History   Not on file  Tobacco Use   Smoking status: Every Day    Packs/day: 0.25    Years: 15.00    Total pack years: 3.75    Types: Cigarettes   Smokeless tobacco: Not on file  Vaping Use   Vaping Use: Not on file  Substance and Sexual Activity   Alcohol use: Not Currently   Drug use: Not Currently   Sexual activity: Not Currently  Other Topics Concern   Not on file  Social History Narrative   Not on file   Social Determinants of Health   Financial Resource Strain: Not on file  Food Insecurity: Unknown (05/18/2022)   Hunger Vital Sign    Worried About Running Out of Food in the Last Year: Patient refused    [place] in the Last Year: Patient refused  Transportation Needs: Unknown (05/18/2022)   [place] - Hydrologist (Medical): Patient refused     Lack of Transportation (Non-Medical): Patient refused  Physical Activity: Not on file  Stress: Not on file  Social Connections: Not on file   Additional Social History:                         Sleep: Fair  Appetite:  Negative  Current Medications: Current Facility-Administered Medications  Medication Dose Route Frequency Provider Last Rate Last Admin   acetaminophen (TYLENOL) tablet 650 mg  650 mg Oral Q6H PRN [name], NP       alum & mag hydroxide-simeth (MAALOX/MYLANTA) 200-200-20 MG/5ML suspension 30 mL  30 mL Oral Q4H PRN [name], NP   30 mL at 05/22/22 0816   cloZAPine (CLOZARIL) tablet 250 mg  250 mg Oral QHS Kripa Foskey T, MD   250 mg at 05/30/22 2113   famotidine (PEPCID) tablet 20 mg  20 mg Oral BID Azyriah Nevins T, MD   20 mg at 05/31/22 0820   hydrOXYzine (ATARAX) tablet 50 mg  50 mg Oral Q6H PRN Jarely Juncaj T, MD   50 mg at 05/17/22 0944   magnesium hydroxide (MILK OF MAGNESIA) suspension 30 mL  30 mL Oral Daily PRN [name], NP   30 mL at 05/27/22 2138   nicotine polacrilex (NICORETTE) gum 2  mg  2 mg Oral PRN Deroy Noah T, MD   2 mg at 04/23/22 2200   paliperidone (INVEGA SUSTENNA) injection 156 mg  156 mg Intramuscular Q28 days Jaselynn Tamas, [name], MD   156 mg at 05/30/22 1100   ziprasidone (GEODON) injection 20 mg  20 mg Intramuscular Q12H PRN [name], MD        Lab Results: No results found for this or any previous visit (from the past 12 hour(s)).  Blood Alcohol level:  Lab Results  Component Value Date   ETH <10 0000000    Metabolic Disorder Labs: Lab Results  Component Value Date   HGBA1C 5.5 04/24/2022   MPG 111 04/24/2022   No results found for: "PROLACTIN" Lab Results  Component Value Date   CHOL 220 (H) 04/24/2022   TRIG 700 (H) 04/24/2022   HDL 35 (L) 04/24/2022   CHOLHDL 6.3 04/24/2022   VLDL UNABLE TO CALCULATE IF TRIGLYCERIDE OVER 400 mg/dL 04/24/2022   LDLCALC UNABLE TO CALCULATE IF  TRIGLYCERIDE OVER 400 mg/dL 04/24/2022    Physical Findings: AIMS: Facial and Oral Movements Muscles of Facial Expression: None, normal Lips and Perioral Area: None, normal Jaw: None, normal Tongue: None, normal,Extremity Movements Upper (arms, wrists, hands, fingers): None, normal Lower (legs, knees, ankles, toes): None, normal, Trunk Movements Neck, shoulders, hips: None, normal, Overall Severity Severity of abnormal movements (highest score from questions above): None, normal Incapacitation due to abnormal movements: None, normal Patient's awareness of abnormal movements (rate only patient's report): No Awareness, Dental Status Current problems with teeth and/or dentures?: No Does patient usually wear dentures?: No  CIWA:    COWS:     Musculoskeletal: Strength & Muscle Tone: within normal limits Gait & Station: normal Patient leans: N/A  Psychiatric Specialty Exam:  Presentation  General Appearance:  Disheveled  Eye Contact: Minimal  Speech: Pressured  Speech Volume: Normal  Handedness: Right   Mood and Affect  Mood: Euphoric; Irritable  Affect: Inappropriate   Thought Process  Thought Processes: Disorganized  Descriptions of Associations:Loose  Orientation:Partial  Thought Content:Illogical; Scattered  History of Schizophrenia/Schizoaffective disorder:No data recorded Duration of Psychotic Symptoms:No data recorded Hallucinations:No data recorded Ideas of Reference:Delusions; Paranoia  Suicidal Thoughts:No data recorded Homicidal Thoughts:No data recorded  Sensorium  Memory: Immediate Poor; Recent Poor; Remote Poor  Judgment: Poor  Insight: Poor   Executive Functions  Concentration: Fair  Attention Span: Fair  Recall: Poor  Fund of Knowledge: Poor  Language: Poor   Psychomotor Activity  Psychomotor Activity:No data recorded  Assets  Assets: Desire for Improvement; Social Support   Sleep  Sleep:No data  recorded   Physical Exam: Physical Exam Vitals reviewed.  Constitutional:      Appearance: Normal appearance.  HENT:     Head: Normocephalic and atraumatic.     Mouth/Throat:     Pharynx: Oropharynx is clear.  Eyes:     Pupils: Pupils are equal, round, and reactive to light.  Cardiovascular:     Rate and Rhythm: Normal rate and regular rhythm.  Pulmonary:     Effort: Pulmonary effort is normal.     Breath sounds: Normal breath sounds.  Abdominal:     General: Abdomen is flat.     Palpations: Abdomen is soft.  Musculoskeletal:        General: Normal range of motion.  Skin:    General: Skin is warm and dry.  Neurological:     General: No focal deficit present.     Mental Status: He  is alert. Mental status is at baseline.  Psychiatric:        Mood and Affect: Mood normal.        Thought Content: Thought content normal.    Review of Systems  Constitutional: Negative.   HENT: Negative.    Eyes: Negative.   Respiratory: Negative.    Cardiovascular: Negative.   Gastrointestinal: Negative.   Musculoskeletal: Negative.   Skin: Negative.   Neurological: Negative.   Psychiatric/Behavioral: Negative.     Blood pressure 117/79, pulse (!) 107, temperature 97.8 F (36.6 C), temperature source Oral, resp. rate 18, height 5' 9"$  (1.753 m), weight 96.4 kg, SpO2 97 %. Body mass index is 31.38 kg/m.   Treatment Plan Summary: Medication management and Plan follow-up with attempts to try and find a place for him to live.  Mainly in the phase of placement.  Medically and psychiatrically stable.  Alethia Berthold, MD 05/31/2022, 4:25 PM

## 2022-05-31 NOTE — BHH Group Notes (Signed)
[facility] Group Notes:  (Nursing/MHT/Case Management/Adjunct)  Date:  05/31/2022  Time:  10:13 AM  Type of Therapy:   Community Meeting  Participation Level:  Did Not Attend   Travis Sandoval [facility] 05/31/2022, 10:13 AM

## 2022-05-31 NOTE — Progress Notes (Signed)
Patient calm and pleasant during assessment denying SI/HI/AVH. Pt observed interacting appropriately with staff and peers on the unit. Pt compliant with medication administration per MD orders. Pt given education, support, and encouragement to be acitve in his treatment plan. Pt being monitored Q 15 minutes for safety per unit protocol, remains safe on the unit

## 2022-05-31 NOTE — Plan of Care (Signed)
D- Patient alert and oriented. Patient presented in a pleasant mood on assessment reporting that he slept good last night and had no complaints to voice to this Probation officer. Patient denied SI, HI, AVH, and pain at this time. Patient also denied any signs/symptoms of depression and anxiety. Patient had no stated goals for today, although whenever he fills out his self-inventory, it appears that he is writing either multiple r's or scribbling on the paper.  A- Scheduled medications administered to patient, per MD orders. Support and encouragement provided.  Routine safety checks conducted every 15 minutes.  Patient informed to notify staff with problems or concerns.  R- No adverse drug reactions noted. Patient contracts for safety at this time. Patient compliant with medications. Patient receptive, calm, and cooperative. Patient isolates to room/self, except for meals and medication. Patient remains safe at this time.  Problem: Education: Goal: Knowledge of General Education information will improve Description: Including pain rating scale, medication(s)/side effects and non-pharmacologic comfort measures Outcome: Progressing   Problem: Health Behavior/Discharge Planning: Goal: Ability to manage health-related needs will improve Outcome: Progressing   Problem: Clinical Measurements: Goal: Ability to maintain clinical measurements within normal limits will improve Outcome: Progressing Goal: Will remain free from infection Outcome: Progressing Goal: Diagnostic test results will improve Outcome: Progressing Goal: Respiratory complications will improve Outcome: Progressing Goal: Cardiovascular complication will be avoided Outcome: Progressing   Problem: Activity: Goal: Risk for activity intolerance will decrease Outcome: Progressing   Problem: Nutrition: Goal: Adequate nutrition will be maintained Outcome: Progressing   Problem: Coping: Goal: Level of anxiety will decrease Outcome:  Progressing   Problem: Elimination: Goal: Will not experience complications related to urinary retention Outcome: Progressing   Problem: Pain Managment: Goal: General experience of comfort will improve Outcome: Progressing   Problem: Safety: Goal: Ability to remain free from injury will improve Outcome: Progressing   Problem: Skin Integrity: Goal: Risk for impaired skin integrity will decrease Outcome: Progressing   Problem: Education: Goal: Knowledge of [place] General Education information/materials will improve Outcome: Progressing Goal: Emotional status will improve Outcome: Progressing Goal: Mental status will improve Outcome: Progressing Goal: Verbalization of understanding the information provided will improve Outcome: Progressing   Problem: Activity: Goal: Interest or engagement in activities will improve Outcome: Progressing Goal: Sleeping patterns will improve Outcome: Progressing   Problem: Coping: Goal: Ability to verbalize frustrations and anger appropriately will improve Outcome: Progressing Goal: Ability to demonstrate self-control will improve Outcome: Progressing   Problem: Health Behavior/Discharge Planning: Goal: Identification of resources available to assist in meeting health care needs will improve Outcome: Progressing Goal: Compliance with treatment plan for underlying cause of condition will improve Outcome: Progressing   Problem: Physical Regulation: Goal: Ability to maintain clinical measurements within normal limits will improve Outcome: Progressing   Problem: Safety: Goal: Periods of time without injury will increase Outcome: Progressing   Problem: Activity: Goal: Will verbalize the importance of balancing activity with adequate rest periods Outcome: Progressing   Problem: Education: Goal: Will be free of psychotic symptoms Outcome: Progressing Goal: Knowledge of the prescribed therapeutic regimen will improve Outcome:  Progressing   Problem: Coping: Goal: Coping ability will improve Outcome: Progressing Goal: Will verbalize feelings Outcome: Progressing   Problem: Health Behavior/Discharge Planning: Goal: Compliance with prescribed medication regimen will improve Outcome: Progressing   Problem: Nutritional: Goal: Ability to achieve adequate nutritional intake will improve Outcome: Progressing   Problem: Role Relationship: Goal: Ability to communicate needs accurately will improve Outcome: Progressing Goal: Ability to interact with others will  improve Outcome: Progressing   Problem: Safety: Goal: Ability to redirect hostility and anger into socially appropriate behaviors will improve Outcome: Progressing Goal: Ability to remain free from injury will improve Outcome: Progressing   Problem: Self-Care: Goal: Ability to participate in self-care as condition permits will improve Outcome: Progressing   Problem: Self-Concept: Goal: Will verbalize positive feelings about self Outcome: Progressing

## 2022-06-01 DIAGNOSIS — F200 Paranoid schizophrenia: Secondary | ICD-10-CM | POA: Diagnosis not present

## 2022-06-01 NOTE — BHH Group Notes (Signed)
[facility] Group Notes:  (Nursing/MHT/Case Management/Adjunct)  Date:  06/01/2022  Time:  4:18 PM  Type of Therapy:  Psychoeducational Skills  Participation Level:  Did Not Attend   Adela Lank [facility] 06/01/2022, 4:18 PM

## 2022-06-01 NOTE — BH IP Treatment Plan (Signed)
Interdisciplinary Treatment and Diagnostic Plan Update  06/01/2022 Time of Session: [place] MRN: [id number]  Principal Diagnosis: Schizophrenia [facility])  Secondary Diagnoses: Principal Problem:   Schizophrenia ([place]) Active Problems:   Psychosis ([place])   Current Medications:  Current Facility-Administered Medications  Medication Dose Route Frequency Provider Last Rate Last Admin   acetaminophen (TYLENOL) tablet 650 mg  650 mg Oral Q6H PRN [name], NP       alum & mag hydroxide-simeth (MAALOX/MYLANTA) 200-200-20 MG/5ML suspension 30 mL  30 mL Oral Q4H PRN [name], NP   30 mL at 05/22/22 0816   cloZAPine (CLOZARIL) tablet 250 mg  250 mg Oral QHS [name], [name], MD   250 mg at 05/31/22 2115   famotidine (PEPCID) tablet 20 mg  20 mg Oral BID [name], [name], MD   20 mg at 06/01/22 [id number]   hydrOXYzine (ATARAX) tablet 50 mg  50 mg Oral Q6H PRN [name], [name], MD   50 mg at 05/17/22 0944   magnesium hydroxide (MILK OF MAGNESIA) suspension 30 mL  30 mL Oral Daily PRN [name], NP   30 mL at 05/27/22 2138   nicotine polacrilex (NICORETTE) gum 2 mg  2 mg Oral PRN [name], [name], MD   2 mg at 04/23/22 2200   paliperidone (INVEGA SUSTENNA) injection 156 mg  156 mg Intramuscular Q28 days [name], [name], MD   156 mg at 05/30/22 1100   ziprasidone (GEODON) injection 20 mg  20 mg Intramuscular Q12H PRN [name], MD       PTA Medications: Medications Prior to Admission  Medication Sig Dispense Refill Last Dose   ABILIFY MAINTENA 400 MG SRER injection Inject 400 mg into the muscle every 28 (twenty-eight) days.      ARIPiprazole (ABILIFY) 10 MG tablet Take 10 mg by mouth daily. (Patient not taking: Reported on 04/16/2022)      atomoxetine (STRATTERA) 40 MG capsule Take 40 mg by mouth daily. (Patient not taking: Reported on 04/16/2022)      atorvastatin (LIPITOR) 40 MG tablet Take 40 mg by mouth daily.      budesonide-formoterol (SYMBICORT) 160-4.5 MCG/ACT  inhaler Inhale 2 puffs into the lungs.      cetirizine (ZYRTEC) 10 MG tablet Take 10 mg by mouth daily.      clozapine (CLOZARIL) 200 MG tablet Take 200 mg by mouth 2 (two) times daily. Take along with one 25 mg tablet for total 225 mg twice daily      cloZAPine (CLOZARIL) 25 MG tablet Take 25 mg by mouth 2 (two) times daily. Take along with one 200 mg tablet for total 225 mg twice daily      Dexlansoprazole (DEXILANT) 30 MG capsule Take 30 mg by mouth daily. (Patient not taking: Reported on 04/16/2022)      hydrOXYzine (VISTARIL) 50 MG capsule Take 50 mg by mouth 3 (three) times daily as needed for itching. (Patient not taking: Reported on 04/16/2022)      Melatonin 5 MG TABS Take 5 mg by mouth at bedtime as needed (sleep).      metoprolol succinate (TOPROL-XL) 25 MG 24 hr tablet Take 12.5 mg by mouth daily.      Phosphatidyl Choline 65 MG TABS Take 1 tablet by mouth daily.  (Patient not taking: Reported on 04/16/2022)      valproic acid (DEPAKENE) 250 MG/5ML solution Take 750-1,000 mLs by mouth 2 (two) times daily. 1000 mg (20 mL) every morning and 750  mg (15 mL) daily at bedtime       Patient Stressors: Other: Psychosis    Patient Strengths: Ability for insight  Active sense of humor  Average or above average intelligence  Capable of independent living  Supportive family/friends   Treatment Modalities: Medication Management, Group therapy, Case management,  1 to 1 session with clinician, Psychoeducation, Recreational therapy.   Physician Treatment Plan for Primary Diagnosis: Schizophrenia ([place]) Long Term Goal(s):     Short Term Goals: None, pt is a poor hx.  Medication Management: Evaluate patient's response, side effects, and tolerance of medication regimen.  Therapeutic Interventions: 1 to 1 sessions, Unit Group sessions and Medication administration.  Evaluation of Outcomes: Progressing  Physician Treatment Plan for Secondary Diagnosis: Principal Problem:   Schizophrenia  ([place]) Active Problems:   Psychosis ([place])  Long Term Goal(s):     Short Term Goals: None, pt is a poor hx.     Medication Management: Evaluate patient's response, side effects, and tolerance of medication regimen.  Therapeutic Interventions: 1 to 1 sessions, Unit Group sessions and Medication administration.  Evaluation of Outcomes: Progressing   RN Treatment Plan for Primary Diagnosis: Schizophrenia ([place]) Long Term Goal(s): Knowledge of disease and therapeutic regimen to maintain health will improve  Short Term Goals: Ability to identify and develop effective coping behaviors will improve and Compliance with prescribed medications will improve  Medication Management: RN will administer medications as ordered by provider, will assess and evaluate patient's response and provide education to patient for prescribed medication. RN will report any adverse and/or side effects to prescribing provider.  Therapeutic Interventions: 1 on 1 counseling sessions, Psychoeducation, Medication administration, Evaluate responses to treatment, Monitor vital signs and CBGs as ordered, Perform/monitor CIWA, COWS, AIMS and Fall Risk screenings as ordered, Perform wound care treatments as ordered.  Evaluation of Outcomes: Progressing   LCSW Treatment Plan for Primary Diagnosis: Schizophrenia ([place]) Long Term Goal(s): Safe transition to appropriate next level of care at discharge, Engage patient in therapeutic group addressing interpersonal concerns.  Short Term Goals: Engage patient in aftercare planning with referrals and resources and Increase skills for wellness and recovery  Therapeutic Interventions: Assess for all discharge needs, 1 to 1 time with Social worker, Explore available resources and support systems, Assess for adequacy in community support network, Educate family and significant other(s) on suicide prevention, Complete Psychosocial Assessment, Interpersonal group therapy.  Evaluation of  Outcomes: Progressing   Progress in Treatment: Attending groups: No. Participating in groups: No. Taking medication as prescribed: Yes. Toleration medication: Yes. Family/Significant other contact made: Yes, individual(s) contacted:  father Stamatis [place]ak[place] Patient understands diagnosis: Yes. Discussing patient identified problems/goals with staff: Yes. Medical problems stabilized or resolved: Yes. Denies suicidal/homicidal ideation: Yes. Issues/concerns per patient self-inventory: No. Other: none  New problem(s) identified: No, Describe:  none  New Short Term/Long Term Goal(s):  Patient Goals:    Discharge Plan or Barriers: safe discharge plan  Reason for Continuation of Hospitalization: Other; describe safe discharge plan  Estimated Length of Stay:2-4 days  Last 3 [place] Suicide Severity Risk Score: Flowsheet Row Admission (Current) from 04/16/2022 in [facility] from 04/15/2022 in [facility] [place] No Risk No Risk       Last PHQ 2/9 Scores:     No data to display          Scribe for Treatment Team: Joanne Chars, [place] 06/01/2022 11:02 AM

## 2022-06-01 NOTE — Progress Notes (Signed)
[facility] MD Progress Note  06/01/2022 12:06 PM Travis Sandoval  MRN:  [id number] Subjective: Travis Sandoval is seen on rounds.  He tells me that he does have a place to go.  He has been compliant with medications.  Nurses report no issues.  He does not have any complaints. Principal Problem: Schizophrenia ([place]) Diagnosis: Principal Problem:   Schizophrenia ([place]) Active Problems:   Psychosis ([place])  Total Time spent with patient: 15 minutes  Past Psychiatric History: Schizophrenia  Past Medical History:  Past Medical History:  Diagnosis Date   ADHD    Bipolar 1 disorder ([place])    Hepatitis C    Schizoaffective disorder ([place])    History reviewed. No pertinent surgical history. Family History:  Family History  Family history unknown: Yes   Family Psychiatric  History: Unremarkable Social History:  Social History   Substance and Sexual Activity  Alcohol Use Not Currently     Social History   Substance and Sexual Activity  Drug Use Not Currently    Social History   Socioeconomic History   Marital status: Unknown    Spouse name: Not on file   Number of children: Not on file   Years of education: Not on file   Highest education level: Not on file  Occupational History   Not on file  Tobacco Use   Smoking status: Every Day    Packs/day: 0.25    Years: 15.00    Total pack years: 3.75    Types: Cigarettes   Smokeless tobacco: Not on file  Vaping Use   Vaping Use: Not on file  Substance and Sexual Activity   Alcohol use: Not Currently   Drug use: Not Currently   Sexual activity: Not Currently  Other Topics Concern   Not on file  Social History Narrative   Not on file   Social Determinants of Health   Financial Resource Strain: Not on file  Food Insecurity: Unknown (05/18/2022)   Hunger Vital Sign    Worried About Running Out of Food in the Last Year: Patient refused    [place] in the Last Year: Patient refused  Transportation Needs: Unknown (05/18/2022)   [place]  - Hydrologist (Medical): Patient refused    Lack of Transportation (Non-Medical): Patient refused  Physical Activity: Not on file  Stress: Not on file  Social Connections: Not on file   Additional Social History:                         Sleep: Good  Appetite:  Good  Current Medications: Current Facility-Administered Medications  Medication Dose Route Frequency Provider Last Rate Last Admin   acetaminophen (TYLENOL) tablet 650 mg  650 mg Oral Q6H PRN [name], NP       alum & mag hydroxide-simeth (MAALOX/MYLANTA) 200-200-20 MG/5ML suspension 30 mL  30 mL Oral Q4H PRN [name], NP   30 mL at 05/22/22 0816   cloZAPine (CLOZARIL) tablet 250 mg  250 mg Oral QHS [name], [name], MD   250 mg at 05/31/22 2115   famotidine (PEPCID) tablet 20 mg  20 mg Oral BID [name], [name], MD   20 mg at 06/01/22 0828   hydrOXYzine (ATARAX) tablet 50 mg  50 mg Oral Q6H PRN [name], [name], MD   50 mg at 05/17/22 0944   magnesium hydroxide (MILK OF MAGNESIA) suspension 30 mL  30 mL Oral Daily PRN  [name], NP   30 mL at 05/27/22 2138   nicotine polacrilex (NICORETTE) gum 2 mg  2 mg Oral PRN [name], [name], MD   2 mg at 04/23/22 2200   paliperidone (INVEGA SUSTENNA) injection 156 mg  156 mg Intramuscular Q28 days [name], [name], MD   156 mg at 05/30/22 1100   ziprasidone (GEODON) injection 20 mg  20 mg Intramuscular Q12H PRN [name], MD        Lab Results: No results found for this or any previous visit (from the past 48 hour(s)).  Blood Alcohol level:  Lab Results  Component Value Date   ETH <10 0000000    Metabolic Disorder Labs: Lab Results  Component Value Date   HGBA1C 5.5 04/24/2022   MPG 111 04/24/2022   No results found for: "PROLACTIN" Lab Results  Component Value Date   CHOL 220 (H) 04/24/2022   TRIG 700 (H) 04/24/2022   HDL 35 (L) 04/24/2022   CHOLHDL 6.3 04/24/2022   VLDL UNABLE TO CALCULATE IF  TRIGLYCERIDE OVER 400 mg/dL 04/24/2022   LDLCALC UNABLE TO CALCULATE IF TRIGLYCERIDE OVER 400 mg/dL 04/24/2022    Physical Findings: AIMS: Facial and Oral Movements Muscles of Facial Expression: None, normal Lips and Perioral Area: None, normal Jaw: None, normal Tongue: None, normal,Extremity Movements Upper (arms, wrists, hands, fingers): None, normal Lower (legs, knees, ankles, toes): None, normal, Trunk Movements Neck, shoulders, hips: None, normal, Overall Severity Severity of abnormal movements (highest score from questions above): None, normal Incapacitation due to abnormal movements: None, normal Patient's awareness of abnormal movements (rate only patient's report): No Awareness, Dental Status Current problems with teeth and/or dentures?: No Does patient usually wear dentures?: No  CIWA:    COWS:     Musculoskeletal: Strength & Muscle Tone: within normal limits Gait & Station: normal Patient leans: N/A  Psychiatric Specialty Exam:  Presentation  General Appearance:  Disheveled  Eye Contact: Minimal  Speech: Pressured  Speech Volume: Normal  Handedness: Right   Mood and Affect  Mood: Euphoric; Irritable  Affect: Inappropriate   Thought Process  Thought Processes: Disorganized  Descriptions of Associations:Loose  Orientation:Partial  Thought Content:Illogical; Scattered  History of Schizophrenia/Schizoaffective disorder:No data recorded Duration of Psychotic Symptoms:No data recorded Hallucinations:No data recorded Ideas of Reference:Delusions; Paranoia  Suicidal Thoughts:No data recorded Homicidal Thoughts:No data recorded  Sensorium  Memory: Immediate Poor; Recent Poor; Remote Poor  Judgment: Poor  Insight: Poor   Executive Functions  Concentration: Fair  Attention Span: Fair  Recall: Poor  Fund of Knowledge: Poor  Language: Poor   Psychomotor Activity  Psychomotor Activity:No data recorded  Assets   Assets: Desire for Improvement; Social Support   Sleep  Sleep:No data recorded    Blood pressure 110/83, pulse (!) 103, temperature 98.1 F (36.7 C), temperature source Oral, resp. rate 18, height 5' 9"$  (1.753 m), weight 96.4 kg, SpO2 97 %. Body mass index is 31.38 kg/m.   Treatment Plan Summary: Daily contact with patient to assess and evaluate symptoms and progress in treatment, Medication management, and Plan continue current medications.  Parks Ranger, DO 06/01/2022, 12:06 PM

## 2022-06-01 NOTE — Plan of Care (Signed)
D- Patient alert and oriented. Patient presented in a pleasant mood on assessment reporting that he slept good last night and had no complaints to voice to this Probation officer. Patient denied SI, HI, AVH, and pain at this time. Patient also denied signs/symptoms of depression and anxiety. Patient had no stated goals for today.  A- Scheduled medications administered to patient, per MD orders. Support and encouragement provided.  Routine safety checks conducted every 15 minutes.  Patient informed to notify staff with problems or concerns.  R- No adverse drug reactions noted. Patient contracts for safety at this time. Patient compliant with medications. Patient receptive, calm, and cooperative. Patient isolates to room, except for meals and medication. Patient remains safe at this time.  Problem: Education: Goal: Knowledge of General Education information will improve Description: Including pain rating scale, medication(s)/side effects and non-pharmacologic comfort measures Outcome: Progressing   Problem: Health Behavior/Discharge Planning: Goal: Ability to manage health-related needs will improve Outcome: Progressing   Problem: Clinical Measurements: Goal: Ability to maintain clinical measurements within normal limits will improve Outcome: Progressing Goal: Will remain free from infection Outcome: Progressing Goal: Diagnostic test results will improve Outcome: Progressing Goal: Respiratory complications will improve Outcome: Progressing Goal: Cardiovascular complication will be avoided Outcome: Progressing   Problem: Activity: Goal: Risk for activity intolerance will decrease Outcome: Progressing   Problem: Nutrition: Goal: Adequate nutrition will be maintained Outcome: Progressing   Problem: Coping: Goal: Level of anxiety will decrease Outcome: Progressing   Problem: Elimination: Goal: Will not experience complications related to urinary retention Outcome: Progressing   Problem:  Pain Managment: Goal: General experience of comfort will improve Outcome: Progressing   Problem: Safety: Goal: Ability to remain free from injury will improve Outcome: Progressing   Problem: Skin Integrity: Goal: Risk for impaired skin integrity will decrease Outcome: Progressing   Problem: Education: Goal: Knowledge of [place] General Education information/materials will improve Outcome: Progressing Goal: Emotional status will improve Outcome: Progressing Goal: Mental status will improve Outcome: Progressing Goal: Verbalization of understanding the information provided will improve Outcome: Progressing   Problem: Activity: Goal: Interest or engagement in activities will improve Outcome: Progressing Goal: Sleeping patterns will improve Outcome: Progressing   Problem: Coping: Goal: Ability to verbalize frustrations and anger appropriately will improve Outcome: Progressing Goal: Ability to demonstrate self-control will improve Outcome: Progressing   Problem: Health Behavior/Discharge Planning: Goal: Identification of resources available to assist in meeting health care needs will improve Outcome: Progressing Goal: Compliance with treatment plan for underlying cause of condition will improve Outcome: Progressing   Problem: Physical Regulation: Goal: Ability to maintain clinical measurements within normal limits will improve Outcome: Progressing   Problem: Safety: Goal: Periods of time without injury will increase Outcome: Progressing   Problem: Activity: Goal: Will verbalize the importance of balancing activity with adequate rest periods Outcome: Progressing   Problem: Education: Goal: Will be free of psychotic symptoms Outcome: Progressing Goal: Knowledge of the prescribed therapeutic regimen will improve Outcome: Progressing   Problem: Coping: Goal: Coping ability will improve Outcome: Progressing Goal: Will verbalize feelings Outcome: Progressing    Problem: Health Behavior/Discharge Planning: Goal: Compliance with prescribed medication regimen will improve Outcome: Progressing   Problem: Nutritional: Goal: Ability to achieve adequate nutritional intake will improve Outcome: Progressing   Problem: Role Relationship: Goal: Ability to communicate needs accurately will improve Outcome: Progressing Goal: Ability to interact with others will improve Outcome: Progressing   Problem: Safety: Goal: Ability to redirect hostility and anger into socially appropriate behaviors will improve Outcome: Progressing  Goal: Ability to remain free from injury will improve Outcome: Progressing   Problem: Self-Care: Goal: Ability to participate in self-care as condition permits will improve Outcome: Progressing   Problem: Self-Concept: Goal: Will verbalize positive feelings about self Outcome: Progressing

## 2022-06-01 NOTE — [facility] Group Notes (Signed)
LCSW Wellness Group Note   06/01/2022 1:00pm  Type of Group and Topic: Psychoeducational Group:  Wellness  Participation Level:  did not attend  Description of Group  Wellness group introduces the topic and its focus on developing healthy habits across the spectrum and its relationship to a decrease in hospital admissions.  Six areas of wellness are discussed: physical, social spiritual, intellectual, occupational, and emotional.  Patients are asked to consider their current wellness habits and to identify areas of wellness where they are interested and able to focus on improvements.    Therapeutic Goals Patients will understand components of wellness and how they can positively impact overall health.  Patients will identify areas of wellness where they have developed good habits. Patients will identify areas of wellness where they would like to make improvements.    Summary of Patient Progress     Therapeutic Modalities: Cognitive Behavioral Therapy Psychoeducation    Joanne Chars, LCSW

## 2022-06-02 DIAGNOSIS — F200 Paranoid schizophrenia: Secondary | ICD-10-CM | POA: Diagnosis not present

## 2022-06-02 NOTE — Progress Notes (Signed)
Patient alert and oriented x 4, affect is flat thoughts are organized and coherent. Patient denies SI/HI/AVH, he isolates to self in his room not interacting with peers and  forwards very little to staff. Patient is complaint with medication regimen.15 minutes safety checks maintained will continue to monitor.

## 2022-06-02 NOTE — Plan of Care (Signed)
Patient isolates in room except for meals. Patient denies SI,HI and AVh.No issues or goal for today verbalized. Appetite and energy level good. Support and encouragement given.

## 2022-06-02 NOTE — [facility] Group Notes (Signed)
LCSW Group Therapy Note  06/02/2022 1230pm  Type of Therapy/Topic:  Group Therapy:  Balance in Life  Participation Level:  Did Not Attend  Description of Group:    This group will address the concept of balance and how it feels and looks when one is unbalanced. Patients will be encouraged to process areas in their lives that are out of balance and identify reasons for remaining unbalanced. Facilitators will guide patients in utilizing problem-solving interventions to address and correct the stressor making their life unbalanced. Understanding and applying boundaries will be explored and addressed for obtaining and maintaining a balanced life. Patients will be encouraged to explore ways to assertively make their unbalanced needs known to significant others in their lives, using other group members and facilitator for support and feedback.  Therapeutic Goals: Patient will identify two or more emotions or situations they have that consume much of in their lives. Patient will identify signs/triggers that life has become out of balance:  Patient will identify two ways to set boundaries in order to achieve balance in their lives:  Patient will demonstrate ability to communicate their needs through discussion and/or role plays  Summary of Patient Progress:      Therapeutic Modalities:   Cognitive Behavioral Therapy Solution-Focused Therapy Assertiveness Training  Deirdre Evener 06/02/2022 1:33 PM

## 2022-06-02 NOTE — Progress Notes (Signed)
[facility] MD Progress Note  06/02/2022 11:39 AM Travis Sandoval  MRN:  [id number] Subjective: Travis Sandoval is seen on rounds.  No changes.  Nurses report no issues.  Has been compliant with medications and denies any side effects. Principal Problem: Schizophrenia ([place]) Diagnosis: Principal Problem:   Schizophrenia ([place]) Active Problems:   Psychosis ([place])  Total Time spent with patient: 15 minutes  Past Psychiatric History: Schizophrenia  Past Medical History:  Past Medical History:  Diagnosis Date   ADHD    Bipolar 1 disorder ([place])    Hepatitis C    Schizoaffective disorder ([place])    History reviewed. No pertinent surgical history. Family History:  Family History  Family history unknown: Yes   Family Psychiatric  History: Unremarkable Social History:  Social History   Substance and Sexual Activity  Alcohol Use Not Currently     Social History   Substance and Sexual Activity  Drug Use Not Currently    Social History   Socioeconomic History   Marital status: Unknown    Spouse name: Not on file   Number of children: Not on file   Years of education: Not on file   Highest education level: Not on file  Occupational History   Not on file  Tobacco Use   Smoking status: Every Day    Packs/day: 0.25    Years: 15.00    Total pack years: 3.75    Types: Cigarettes   Smokeless tobacco: Not on file  Vaping Use   Vaping Use: Not on file  Substance and Sexual Activity   Alcohol use: Not Currently   Drug use: Not Currently   Sexual activity: Not Currently  Other Topics Concern   Not on file  Social History Narrative   Not on file   Social Determinants of Health   Financial Resource Strain: Not on file  Food Insecurity: Unknown (05/18/2022)   Hunger Vital Sign    Worried About Running Out of Food in the Last Year: Patient refused    [name] in the Last Year: Patient refused  Transportation Needs: Unknown (05/18/2022)   [place] - Radiographer, therapeutic (Medical): Patient refused    Lack of Transportation (Non-Medical): Patient refused  Physical Activity: Not on file  Stress: Not on file  Social Connections: Not on file   Additional Social History:                         Sleep: Good  Appetite:  Good  Current Medications: Current Facility-Administered Medications  Medication Dose Route Frequency Provider Last Rate Last Admin   acetaminophen (TYLENOL) tablet 650 mg  650 mg Oral Q6H PRN [name], NP       alum & mag hydroxide-simeth (MAALOX/MYLANTA) 200-200-20 MG/5ML suspension 30 mL  30 mL Oral Q4H PRN [name], NP   30 mL at 05/22/22 0816   cloZAPine (CLOZARIL) tablet 250 mg  250 mg Oral QHS [name], [name], MD   250 mg at 06/01/22 2139   famotidine (PEPCID) tablet 20 mg  20 mg Oral BID [name], [name], MD   20 mg at 06/02/22 0811   hydrOXYzine (ATARAX) tablet 50 mg  50 mg Oral Q6H PRN [name], [name], MD   50 mg at 05/17/22 0944   magnesium hydroxide (MILK OF MAGNESIA) suspension 30 mL  30 mL Oral Daily PRN [name], NP   30 mL at 05/27/22 2138  nicotine polacrilex (NICORETTE) gum 2 mg  2 mg Oral PRN [name], [name], MD   2 mg at 04/23/22 2200   paliperidone (INVEGA SUSTENNA) injection 156 mg  156 mg Intramuscular Q28 days [name], [name], MD   156 mg at 05/30/22 1100   ziprasidone (GEODON) injection 20 mg  20 mg Intramuscular Q12H PRN [name], MD        Lab Results: No results found for this or any previous visit (from the past 46 hour(s)).  Blood Alcohol level:  Lab Results  Component Value Date   ETH <10 0000000    Metabolic Disorder Labs: Lab Results  Component Value Date   HGBA1C 5.5 04/24/2022   MPG 111 04/24/2022   No results found for: "PROLACTIN" Lab Results  Component Value Date   CHOL 220 (H) 04/24/2022   TRIG 700 (H) 04/24/2022   HDL 35 (L) 04/24/2022   CHOLHDL 6.3 04/24/2022   VLDL UNABLE TO CALCULATE IF TRIGLYCERIDE OVER 400 mg/dL  04/24/2022   LDLCALC UNABLE TO CALCULATE IF TRIGLYCERIDE OVER 400 mg/dL 04/24/2022    Physical Findings: AIMS: Facial and Oral Movements Muscles of Facial Expression: None, normal Lips and Perioral Area: None, normal Jaw: None, normal Tongue: None, normal,Extremity Movements Upper (arms, wrists, hands, fingers): None, normal Lower (legs, knees, ankles, toes): None, normal, Trunk Movements Neck, shoulders, hips: None, normal, Overall Severity Severity of abnormal movements (highest score from questions above): None, normal Incapacitation due to abnormal movements: None, normal Patient's awareness of abnormal movements (rate only patient's report): No Awareness, Dental Status Current problems with teeth and/or dentures?: No Does patient usually wear dentures?: No  CIWA:    COWS:     Musculoskeletal: Strength & Muscle Tone: within normal limits Gait & Station: normal Patient leans: N/A  Psychiatric Specialty Exam:  Presentation  General Appearance:  Disheveled  Eye Contact: Minimal  Speech: Pressured  Speech Volume: Normal  Handedness: Right   Mood and Affect  Mood: Euphoric; Irritable  Affect: Inappropriate   Thought Process  Thought Processes: Disorganized  Descriptions of Associations:Loose  Orientation:Partial  Thought Content:Illogical; Scattered  History of Schizophrenia/Schizoaffective disorder:No data recorded Duration of Psychotic Symptoms:No data recorded Hallucinations:No data recorded Ideas of Reference:Delusions; Paranoia  Suicidal Thoughts:No data recorded Homicidal Thoughts:No data recorded  Sensorium  Memory: Immediate Poor; Recent Poor; Remote Poor  Judgment: Poor  Insight: Poor   Executive Functions  Concentration: Fair  Attention Span: Fair  Recall: Poor  Fund of Knowledge: Poor  Language: Poor   Psychomotor Activity  Psychomotor Activity:No data recorded  Assets  Assets: Desire for Improvement;  Social Support   Sleep  Sleep:No data recorded    Blood pressure 109/80, pulse 95, temperature 97.7 F (36.5 C), temperature source Oral, resp. rate 18, height 5' 9"$  (1.753 m), weight 96.4 kg, SpO2 97 %. Body mass index is 31.38 kg/m.   Treatment Plan Summary: Daily contact with patient to assess and evaluate symptoms and progress in treatment, Medication management, and Plan continue current medications.  [place] Ranger, DO 06/02/2022, 11:39 AM

## 2022-06-03 DIAGNOSIS — F203 Undifferentiated schizophrenia: Secondary | ICD-10-CM | POA: Diagnosis not present

## 2022-06-03 NOTE — Group Note (Signed)
[facility] LCSW Group Therapy Note    Group Date: 06/03/2022 Start Time: 1300 End Time: 1400  Type of Therapy and Topic:  Group Therapy:  Overcoming Obstacles  Participation Level:  [facility] PARTICIPATION LEVEL: Did Not Attend   Description of Group:   In this group patients will be encouraged to explore what they see as obstacles to their own wellness and recovery. They will be guided to discuss their thoughts, feelings, and behaviors related to these obstacles. The group will process together ways to cope with barriers, with attention given to specific choices patients can make. Each patient will be challenged to identify changes they are motivated to make in order to overcome their obstacles. This group will be process-oriented, with patients participating in exploration of their own experiences as well as giving and receiving support and challenge from other group members.  Therapeutic Goals: 1. Patient will identify personal and current obstacles as they relate to admission. 2. Patient will identify barriers that currently interfere with their wellness or overcoming obstacles.  3. Patient will identify feelings, thought process and behaviors related to these barriers. 4. Patient will identify two changes they are willing to make to overcome these obstacles:    Summary of Patient Progress X   Therapeutic Modalities:   Cognitive Behavioral Therapy Solution Focused Therapy Motivational Interviewing Relapse Prevention Therapy   Shirl Harris, LCSW

## 2022-06-03 NOTE — Plan of Care (Signed)
  Problem: Education: Goal: Knowledge of General Education information will improve Description: Including pain rating scale, medication(s)/side effects and non-pharmacologic comfort measures Outcome: Progressing   Problem: Coping: Goal: Level of anxiety will decrease Outcome: Progressing   Problem: Education: Goal: Knowledge of [place] General Education information/materials will improve Outcome: Progressing Goal: Emotional status will improve Outcome: Progressing Goal: Mental status will improve Outcome: Progressing   Problem: Activity: Goal: Interest or engagement in activities will improve Outcome: Not Progressing

## 2022-06-03 NOTE — Progress Notes (Signed)
Pt denies SI/HI/AVH and verbally agrees to approach staff if these become apparent or before harming themselves/others. Rates depression 0/10. Rates anxiety 0/10. Rates pain 0/10.  Scheduled medications administered to pt, per MD orders. RN provided support and encouragement to pt. Q15 min safety checks implemented and continued. Pt safe on the unit. RN will continue to monitor and intervene as needed.  06/03/22 0733  Psych Admission Type (Psych Patients Only)  Admission Status Voluntary  Psychosocial Assessment  Patient Complaints None  Eye Contact Fair  Facial Expression Flat  Affect Flat  Speech Logical/coherent  Interaction Minimal;Forwards little;Isolative  Motor Activity Slow  Appearance/Hygiene Unremarkable  Behavior Characteristics Calm;Cooperative;Appropriate to situation  Mood Pleasant  Aggressive Behavior  Effect No apparent injury  Thought Process  Coherency Circumstantial  Content WDL  Delusions None reported or observed  Perception WDL  Hallucination None reported or observed  Judgment Impaired  Confusion None  Danger to Self  Current suicidal ideation? Denies  Danger to Others  Danger to Others None reported or observed

## 2022-06-03 NOTE — Progress Notes (Signed)
[facility] MD Progress Note  06/03/2022 3:45 PM Travis Sandoval  MRN:  [id number] Subjective: Follow-up patient with schizophrenia.  No change presentation.  Mostly stays to himself.  No complaints.  Taking his medicine.  Physically appears stable. Principal Problem: Schizophrenia ([place]) Diagnosis: Principal Problem:   Schizophrenia ([place]) Active Problems:   Psychosis ([place])  Total Time spent with patient: 15 minutes  Past Psychiatric History: Past history of schizophrenia with lengthy hospitalizations  Past Medical History:  Past Medical History:  Diagnosis Date   ADHD    Bipolar 1 disorder ([place])    Hepatitis C    Schizoaffective disorder ([place])    History reviewed. No pertinent surgical history. Family History:  Family History  Family history unknown: Yes   Family Psychiatric  History: See previous Social History:  Social History   Substance and Sexual Activity  Alcohol Use Not Currently     Social History   Substance and Sexual Activity  Drug Use Not Currently    Social History   Socioeconomic History   Marital status: Unknown    Spouse name: Not on file   Number of children: Not on file   Years of education: Not on file   Highest education level: Not on file  Occupational History   Not on file  Tobacco Use   Smoking status: Every Day    Packs/day: 0.25    Years: 15.00    Total pack years: 3.75    Types: Cigarettes   Smokeless tobacco: Not on file  Vaping Use   Vaping Use: Not on file  Substance and Sexual Activity   Alcohol use: Not Currently   Drug use: Not Currently   Sexual activity: Not Currently  Other Topics Concern   Not on file  Social History Narrative   Not on file   Social Determinants of Health   Financial Resource Strain: Not on file  Food Insecurity: Unknown (05/18/2022)   Hunger Vital Sign    Worried About Running Out of Food in the Last Year: Patient refused    [place] in the Last Year: Patient refused  Transportation Needs:  Unknown (05/18/2022)   [place] - Hydrologist (Medical): Patient refused    Lack of Transportation (Non-Medical): Patient refused  Physical Activity: Not on file  Stress: Not on file  Social Connections: Not on file   Additional Social History:                         Sleep: Fair  Appetite:  Fair  Current Medications: Current Facility-Administered Medications  Medication Dose Route Frequency Provider Last Rate Last Admin   acetaminophen (TYLENOL) tablet 650 mg  650 mg Oral Q6H PRN [name], NP       alum & mag hydroxide-simeth (MAALOX/MYLANTA) 200-200-20 MG/5ML suspension 30 mL  30 mL Oral Q4H PRN [name], NP   30 mL at 05/22/22 0816   cloZAPine (CLOZARIL) tablet 250 mg  250 mg Oral QHS Sayan Aldava T, MD   250 mg at 06/02/22 2112   famotidine (PEPCID) tablet 20 mg  20 mg Oral BID Tory Septer, [name], MD   20 mg at 06/03/22 0733   hydrOXYzine (ATARAX) tablet 50 mg  50 mg Oral Q6H PRN Michaela Shankel, [name], MD   50 mg at 05/17/22 0944   magnesium hydroxide (MILK OF MAGNESIA) suspension 30 mL  30 mL Oral Daily PRN [name], NP  30 mL at 05/27/22 2138   nicotine polacrilex (NICORETTE) gum 2 mg  2 mg Oral PRN Keelia Graybill T, MD   2 mg at 04/23/22 2200   paliperidone (INVEGA SUSTENNA) injection 156 mg  156 mg Intramuscular Q28 days Tiffiney Sparrow T, MD   156 mg at 05/30/22 1100   ziprasidone (GEODON) injection 20 mg  20 mg Intramuscular Q12H PRN [name], MD        Lab Results: No results found for this or any previous visit (from the past 70 hour(s)).  Blood Alcohol level:  Lab Results  Component Value Date   ETH <10 0000000    Metabolic Disorder Labs: Lab Results  Component Value Date   HGBA1C 5.5 04/24/2022   MPG 111 04/24/2022   No results found for: "PROLACTIN" Lab Results  Component Value Date   CHOL 220 (H) 04/24/2022   TRIG 700 (H) 04/24/2022   HDL 35 (L) 04/24/2022   CHOLHDL 6.3 04/24/2022   VLDL  UNABLE TO CALCULATE IF TRIGLYCERIDE OVER 400 mg/dL 04/24/2022   LDLCALC UNABLE TO CALCULATE IF TRIGLYCERIDE OVER 400 mg/dL 04/24/2022    Physical Findings: AIMS: Facial and Oral Movements Muscles of Facial Expression: None, normal Lips and Perioral Area: None, normal Jaw: None, normal Tongue: None, normal,Extremity Movements Upper (arms, wrists, hands, fingers): None, normal Lower (legs, knees, ankles, toes): None, normal, Trunk Movements Neck, shoulders, hips: None, normal, Overall Severity Severity of abnormal movements (highest score from questions above): None, normal Incapacitation due to abnormal movements: None, normal Patient's awareness of abnormal movements (rate only patient's report): No Awareness, Dental Status Current problems with teeth and/or dentures?: No Does patient usually wear dentures?: No  CIWA:    COWS:     Musculoskeletal: Strength & Muscle Tone: within normal limits Gait & Station: normal Patient leans: N/A  Psychiatric Specialty Exam:  Presentation  General Appearance:  Disheveled  Eye Contact: Minimal  Speech: Pressured  Speech Volume: Normal  Handedness: Right   Mood and Affect  Mood: Euphoric; Irritable  Affect: Inappropriate   Thought Process  Thought Processes: Disorganized  Descriptions of Associations:Loose  Orientation:Partial  Thought Content:Illogical; Scattered  History of Schizophrenia/Schizoaffective disorder:No data recorded Duration of Psychotic Symptoms:No data recorded Hallucinations:No data recorded Ideas of Reference:Delusions; Paranoia  Suicidal Thoughts:No data recorded Homicidal Thoughts:No data recorded  Sensorium  Memory: Immediate Poor; Recent Poor; Remote Poor  Judgment: Poor  Insight: Poor   Executive Functions  Concentration: Fair  Attention Span: Fair  Recall: Poor  Fund of Knowledge: Poor  Language: Poor   Psychomotor Activity  Psychomotor Activity:No data  recorded  Assets  Assets: Desire for Improvement; Social Support   Sleep  Sleep:No data recorded   Physical Exam: Physical Exam Vitals and nursing note reviewed.  Constitutional:      Appearance: Normal appearance.  HENT:     Head: Normocephalic and atraumatic.     Mouth/Throat:     Pharynx: Oropharynx is clear.  Eyes:     Pupils: Pupils are equal, round, and reactive to light.  Cardiovascular:     Rate and Rhythm: Normal rate and regular rhythm.  Pulmonary:     Effort: Pulmonary effort is normal.     Breath sounds: Normal breath sounds.  Abdominal:     General: Abdomen is flat.     Palpations: Abdomen is soft.  Musculoskeletal:        General: Normal range of motion.  Skin:    General: Skin is warm and dry.  Neurological:  General: No focal deficit present.     Mental Status: He is alert. Mental status is at baseline.  Psychiatric:        Attention and Perception: Attention normal.        Mood and Affect: Mood normal. Affect is blunt.        Speech: Speech is delayed.        Behavior: Behavior is slowed.        Thought Content: Thought content normal.        Cognition and Memory: Cognition is impaired.    Review of Systems  Constitutional: Negative.   HENT: Negative.    Eyes: Negative.   Respiratory: Negative.    Cardiovascular: Negative.   Gastrointestinal: Negative.   Musculoskeletal: Negative.   Skin: Negative.   Neurological: Negative.   Psychiatric/Behavioral: Negative.     Blood pressure 131/80, pulse 97, temperature 98.4 F (36.9 C), temperature source Oral, resp. rate 16, height 5' 9"$  (1.753 m), weight 96.4 kg, SpO2 96 %. Body mass index is 31.38 kg/m.   Treatment Plan Summary: Medication management and Plan follow-up with continued treatment here.  Mostly focused on discharge planning when finances can be worked out  Alethia Berthold, MD 06/03/2022, 3:45 PM

## 2022-06-03 NOTE — Progress Notes (Signed)
Condition unchanged , he appears calm not anxious , isolates to self in his room and denies SI/HI/AVH .Patient is compliant with medication regimen no distress noted will continue to monitor.

## 2022-06-03 NOTE — BHH Group Notes (Deleted)
[place] Group Notes:  (Nursing/MHT/Case Management/Adjunct)  Date:  06/03/2022  Time:  4:51 PM  Type of Therapy:  Psychoeducational Skills  Participation Level:  Active  Participation Quality:  Appropriate  Affect:  Appropriate  Cognitive:  Alert and Appropriate  Insight:  Appropriate  Engagement in Group:  Engaged  Modes of Intervention:  Activity  Summary of Progress/Problems:  Travis Sandoval 06/03/2022, 4:51 PM

## 2022-06-03 NOTE — Group Note (Signed)
Recreation Therapy Group Note   Group Topic:Coping Skills  Group Date: 06/03/2022 Start Time: 1000 End Time: 1040 Facilitators: Vilma Prader, LRT, CTRS Location:  Craft Room  Group Description:  Mind Map.  Patient was provided a blank template of a diagram with 32 blank boxes in a tiered system, branching from the center (similar to a bubble chart). LRT directed patients to label the middle of the diagram "Coping Skills". LRT and patients then came up with 8 different coping skills as examples. Pt were directed to record their coping skills in the 2nd tier boxes closest to the center.  Patients would then share their coping skills with the group as LRT wrote them on the board.  Affect/Mood: N/A   Participation Level: Did not attend    Clinical Observations/Individualized Feedback: Travis Sandoval did not attend group due to resting in his room.  Plan: Continue to engage patient in RT group sessions 2-3x/week.   Vilma Prader, LRT, CTRS  06/03/2022 11:03 AM

## 2022-06-03 NOTE — BHH Group Notes (Signed)
[place] Group Notes:  (Nursing/MHT/Case Management/Adjunct)  Date:  06/03/2022  Time:  4:51 PM  Type of Therapy:  Psychoeducational Skills  Participation Level:  Did Not Attend  Participation Quality:    Affect:    Cognitive:    Insight:    Engagement in Group:    Modes of Intervention:    Summary of Progress/Problems:  Travis Sandoval 06/03/2022, 4:51 PM

## 2022-06-04 DIAGNOSIS — F200 Paranoid schizophrenia: Secondary | ICD-10-CM | POA: Diagnosis not present

## 2022-06-04 NOTE — BHH Group Notes (Signed)
[place] Group Notes:  (Nursing/MHT/Case Management/Adjunct)  Date:  06/04/2022  Time:  4:29 AM  Type of Therapy:  Group Therapy  Participation Level:  Did Not Attend    Maglione,Rena Sweeden E 06/04/2022, 4:29 AM

## 2022-06-04 NOTE — Plan of Care (Signed)
  Problem: Education: Goal: Knowledge of [facility] Education information/materials will improve Outcome: Progressing Goal: Verbalization of understanding the information provided will improve Outcome: Progressing   Problem: Activity: Goal: Sleeping patterns will improve Outcome: Progressing   Problem: Education: Goal: Emotional status will improve Outcome: Not Progressing Goal: Mental status will improve Outcome: Not Progressing   Problem: Activity: Goal: Interest or engagement in activities will improve Outcome: Not Progressing

## 2022-06-04 NOTE — Group Note (Signed)
LCSW Group Therapy Note   Group Date: 06/04/2022 Start Time: 1310 End Time: 1400   Type of Therapy and Topic:  Group Therapy: Boundaries  Participation Level:  Did Not Attend  Description of Group: This group will address the use of boundaries in their personal lives. Patients will explore why boundaries are important, the difference between healthy and unhealthy boundaries, and negative and postive outcomes of different boundaries and will look at how boundaries can be crossed.  Patients will be encouraged to identify current boundaries in their own lives and identify what kind of boundary is being set. Facilitators will guide patients in utilizing problem-solving interventions to address and correct types boundaries being used and to address when no boundary is being used. Understanding and applying boundaries will be explored and addressed for obtaining and maintaining a balanced life. Patients will be encouraged to explore ways to assertively make their boundaries and needs known to significant others in their lives, using other group members and facilitator for role play, support, and feedback.  Therapeutic Goals:  1.  Patient will identify areas in their life where setting clear boundaries could be  used to improve their life.  2.  Patient will identify signs/triggers that a boundary is not being respected. 3.  Patient will identify two ways to set boundaries in order to achieve balance in  their lives: 4.  Patient will demonstrate ability to communicate their needs and set boundaries  through discussion and/or role plays  Summary of Patient Progress:   X  Therapeutic Modalities:   Cognitive Behavioral Therapy Solution-Focused Therapy  Rozann Lesches, [place] 06/04/2022  2:10 PM

## 2022-06-04 NOTE — BHH Group Notes (Signed)
[place] Group Notes:  (Nursing/MHT/Case Management/Adjunct)  Date:  06/04/2022  Time:  6:07 PM  Type of Therapy:  Psychoeducational Skills  Participation Level:  Did Not Attend   Adela Lank [facility] 06/04/2022, 6:07 PM

## 2022-06-04 NOTE — BHH Group Notes (Signed)
[place] Group Notes:  (Nursing/MHT/Case Management/Adjunct)  Date:  06/04/2022  Time:  4:26 AM  Type of Therapy:  Group Therapy  Participation Level:  Did Not Attend  Maglione,Asahd Can E 06/04/2022, 4:26 AM

## 2022-06-04 NOTE — Progress Notes (Signed)
Isolative to self and room. No complaints or concerns voiced. Denies SI, HI, AVH. No interaction with peers, minimal wth staff.  Encouragement and support provided. Safety checks maintained. Meds given as prescribed. Pt receptive and remains safe on unit with q 15 min checks.

## 2022-06-04 NOTE — Plan of Care (Signed)
°  Problem: Nutrition: °Goal: Adequate nutrition will be maintained °Outcome: Progressing °  °Problem: Coping: °Goal: Level of anxiety will decrease °Outcome: Progressing °  °Problem: Safety: °Goal: Ability to remain free from injury will improve °Outcome: Progressing °  °

## 2022-06-04 NOTE — Progress Notes (Signed)
[facility] MD Progress Note  06/04/2022 12:45 PM Travis Sandoval  MRN:  [id number] Subjective: Travis Sandoval is seen on rounds.  He has been compliant with his medication.  He is isolative to his room.  He has no complaints and asks when he can leave.  I spoke with social worker who says that he has no place to go right now.  He has been pleasant and cooperative and in good controls. Principal Problem: Schizophrenia ([place]) Diagnosis: Principal Problem:   Schizophrenia ([place]) Active Problems:   Psychosis ([place])  Total Time spent with patient: 15 minutes  Past Psychiatric History: Schizophrenia  Past Medical History:  Past Medical History:  Diagnosis Date   ADHD    Bipolar 1 disorder ([place])    Hepatitis C    Schizoaffective disorder ([place])    History reviewed. No pertinent surgical history. Family History:  Family History  Family history unknown: Yes   Family Psychiatric  History: Unremarkable Social History:  Social History   Substance and Sexual Activity  Alcohol Use Not Currently     Social History   Substance and Sexual Activity  Drug Use Not Currently    Social History   Socioeconomic History   Marital status: Unknown    Spouse name: Not on file   Number of children: Not on file   Years of education: Not on file   Highest education level: Not on file  Occupational History   Not on file  Tobacco Use   Smoking status: Every Day    Packs/day: 0.25    Years: 15.00    Total pack years: 3.75    Types: Cigarettes   Smokeless tobacco: Not on file  Vaping Use   Vaping Use: Not on file  Substance and Sexual Activity   Alcohol use: Not Currently   Drug use: Not Currently   Sexual activity: Not Currently  Other Topics Concern   Not on file  Social History Narrative   Not on file   Social Determinants of Health   Financial Resource Strain: Not on file  Food Insecurity: Unknown (05/18/2022)   Hunger Vital Sign    Worried About Running Out of Food in the Last Year: Patient  refused    [place] in the Last Year: Patient refused  Transportation Needs: Unknown (05/18/2022)   [place] - Hydrologist (Medical): Patient refused    Lack of Transportation (Non-Medical): Patient refused  Physical Activity: Not on file  Stress: Not on file  Social Connections: Not on file   Additional Social History:                         Sleep: Good  Appetite:  Good  Current Medications: Current Facility-Administered Medications  Medication Dose Route Frequency Provider Last Rate Last Admin   acetaminophen (TYLENOL) tablet 650 mg  650 mg Oral Q6H PRN [name], NP       alum & mag hydroxide-simeth (MAALOX/MYLANTA) 200-200-20 MG/5ML suspension 30 mL  30 mL Oral Q4H PRN [name], NP   30 mL at 05/22/22 0816   cloZAPine (CLOZARIL) tablet 250 mg  250 mg Oral QHS [name], [name], MD   250 mg at 06/03/22 2059   famotidine (PEPCID) tablet 20 mg  20 mg Oral BID [name], [name], MD   20 mg at 06/04/22 0839   hydrOXYzine (ATARAX) tablet 50 mg  50 mg Oral Q6H PRN [name], [name], MD  50 mg at 05/17/22 0944   magnesium hydroxide (MILK OF MAGNESIA) suspension 30 mL  30 mL Oral Daily PRN [name], NP   30 mL at 05/27/22 2138   nicotine polacrilex (NICORETTE) gum 2 mg  2 mg Oral PRN [name], [name], MD   2 mg at 04/23/22 2200   paliperidone (INVEGA SUSTENNA) injection 156 mg  156 mg Intramuscular Q28 days [name], [name], MD   156 mg at 05/30/22 1100   ziprasidone (GEODON) injection 20 mg  20 mg Intramuscular Q12H PRN [name], MD        Lab Results: No results found for this or any previous visit (from the past 48 hour(s)).  Blood Alcohol level:  Lab Results  Component Value Date   ETH <10 0000000    Metabolic Disorder Labs: Lab Results  Component Value Date   HGBA1C 5.5 04/24/2022   MPG 111 04/24/2022   No results found for: "PROLACTIN" Lab Results  Component Value Date   CHOL 220 (H) 04/24/2022    TRIG 700 (H) 04/24/2022   HDL 35 (L) 04/24/2022   CHOLHDL 6.3 04/24/2022   VLDL UNABLE TO CALCULATE IF TRIGLYCERIDE OVER 400 mg/dL 04/24/2022   LDLCALC UNABLE TO CALCULATE IF TRIGLYCERIDE OVER 400 mg/dL 04/24/2022    Physical Findings: AIMS: Facial and Oral Movements Muscles of Facial Expression: None, normal Lips and Perioral Area: None, normal Jaw: None, normal Tongue: None, normal,Extremity Movements Upper (arms, wrists, hands, fingers): None, normal Lower (legs, knees, ankles, toes): None, normal, Trunk Movements Neck, shoulders, hips: None, normal, Overall Severity Severity of abnormal movements (highest score from questions above): None, normal Incapacitation due to abnormal movements: None, normal Patient's awareness of abnormal movements (rate only patient's report): No Awareness, Dental Status Current problems with teeth and/or dentures?: No Does patient usually wear dentures?: No  CIWA:    COWS:     Musculoskeletal: Strength & Muscle Tone: within normal limits Gait & Station: normal Patient leans: N/A  Psychiatric Specialty Exam:  Presentation  General Appearance:  Disheveled  Eye Contact: Minimal  Speech: Pressured  Speech Volume: Normal  Handedness: Right   Mood and Affect  Mood: Euphoric; Irritable  Affect: Inappropriate   Thought Process  Thought Processes: Disorganized  Descriptions of Associations:Loose  Orientation:Partial  Thought Content:Illogical; Scattered  History of Schizophrenia/Schizoaffective disorder:No data recorded Duration of Psychotic Symptoms:No data recorded Hallucinations:No data recorded Ideas of Reference:Delusions; Paranoia  Suicidal Thoughts:No data recorded Homicidal Thoughts:No data recorded  Sensorium  Memory: Immediate Poor; Recent Poor; Remote Poor  Judgment: Poor  Insight: Poor   Executive Functions  Concentration: Fair  Attention Span: Fair  Recall: Poor  Fund of  Knowledge: Poor  Language: Poor   Psychomotor Activity  Psychomotor Activity:No data recorded  Assets  Assets: Desire for Improvement; Social Support   Sleep  Sleep:No data recorded   Physical Exam: Physical Exam Vitals and nursing note reviewed.  Constitutional:      Appearance: Normal appearance. He is normal weight.  Neurological:     General: No focal deficit present.     Mental Status: He is alert and oriented to person, place, and time.  Psychiatric:        Attention and Perception: Attention and perception normal.        Mood and Affect: Mood normal. Affect is labile.        Speech: Speech normal.        Behavior: Behavior normal. Behavior is cooperative.  Thought Content: Thought content is paranoid.        Cognition and Memory: Cognition and memory normal.        Judgment: Judgment normal.    Review of Systems  Constitutional: Negative.   HENT: Negative.    Eyes: Negative.   Respiratory: Negative.    Cardiovascular: Negative.   Gastrointestinal: Negative.   Genitourinary: Negative.   Musculoskeletal: Negative.   Skin: Negative.   Neurological: Negative.   Endo/Heme/Allergies: Negative.   Psychiatric/Behavioral: Negative.     Blood pressure 120/82, pulse 91, temperature 98.1 F (36.7 C), temperature source Oral, resp. rate 18, height 5' 9"$  (1.753 m), weight 96.4 kg, SpO2 97 %. Body mass index is 31.38 kg/m.   Treatment Plan Summary: Daily contact with patient to assess and evaluate symptoms and progress in treatment, Medication management, and Plan continue current medications.  Parks Ranger, DO 06/04/2022, 12:45 PM

## 2022-06-04 NOTE — Progress Notes (Signed)
Pt denies SI/HI/AVH and verbally agrees to approach staff if these become apparent or before harming themselves/others. Rates depression 0/10. Rates anxiety 0/10. Rates pain 0/10.  Scheduled medications administered to pt, per MD orders. RN provided support and encouragement to pt. Q15 min safety checks implemented and continued. Pt safe on the unit. RN will continue to monitor and intervene as needed.  06/04/22 0839  Psych Admission Type (Psych Patients Only)  Admission Status Voluntary  Psychosocial Assessment  Patient Complaints None  Eye Contact Fair  Facial Expression Flat  Affect Flat  Speech Logical/coherent  Interaction Forwards little;Isolative;Minimal  Motor Activity Slow  Appearance/Hygiene Unremarkable  Behavior Characteristics Cooperative;Appropriate to situation;Anxious  Mood Anxious;Pleasant  Aggressive Behavior  Effect No apparent injury  Thought Process  Coherency Circumstantial  Content WDL  Delusions None reported or observed  Perception WDL  Hallucination None reported or observed  Judgment Impaired  Confusion None  Danger to Self  Current suicidal ideation? Denies  Danger to Others  Danger to Others None reported or observed

## 2022-06-04 NOTE — BHH Group Notes (Signed)
[place] Group Notes:  (Nursing/MHT/Case Management/Adjunct)  Date:  06/04/2022  Time:  8:17 PM  Type of Therapy:   Wrap up  Participation Level:  Did Not Attend  Summary of Progress/Problems:  Travis Sandoval 06/04/2022, 8:17 PM

## 2022-06-04 NOTE — Group Note (Signed)
Recreation Therapy Group Note   Group Topic:Problem Solving  Group Date: 06/04/2022 Start Time: 1000 End Time: 1055 Facilitators: Vilma Prader, LRT, CTRS Location:  Craft Room  Group Description: Life Boat. Patients were given the scenario that they are on a boat that is about to become shipwrecked, leaving them stranded on an [place]. They are asked to make a list of 15 different items that they want to take with them when they are stranded on the [place]. Patients are asked to rank their items from most important to least important, #1 being the most important and #15 being the least. Patients will work individually for the first round to come up with 15 items and then pair up with a peer(s) to condense their list and come up with one list of 15 items between the two of them. Patients or LRT will read aloud the 15 different items to the group after each round.   Affect/Mood: N/A   Participation Level: Did not attend    Clinical Observations/Individualized Feedback: Kore did not attend group due to resting in his room.  Plan: Continue to engage patient in RT group sessions 2-3x/week.   Vilma Prader, LRT, CTRS  06/04/2022 11:12 AM

## 2022-06-04 NOTE — Progress Notes (Signed)
Patient calm and pleasant during assessment denying SI/HI/AVH. Pt observed interacting appropriately with staff and peers on the unit. Pt compliant with medication administration per MD orders. Pt given education, support, and encouragement to be acitve in his treatment plan. Pt being monitored Q 15 minutes for safety per unit protocol, remains safe on the unit

## 2022-06-05 DIAGNOSIS — F203 Undifferentiated schizophrenia: Secondary | ICD-10-CM | POA: Diagnosis not present

## 2022-06-05 LAB — CBC WITH DIFFERENTIAL/PLATELET
Abs Immature Granulocytes: 0.02 10*3/uL (ref 0.00–0.07)
Basophils Absolute: 0.1 10*3/uL (ref 0.0–0.1)
Basophils Relative: 1 %
Eosinophils Absolute: 0.2 10*3/uL (ref 0.0–0.5)
Eosinophils Relative: 2 %
HCT: 42.3 % (ref 39.0–52.0)
Hemoglobin: 14.5 g/dL (ref 13.0–17.0)
Immature Granulocytes: 0 %
Lymphocytes Relative: 34 %
Lymphs Abs: 2.7 10*3/uL (ref 0.7–4.0)
MCH: 30.7 pg (ref 26.0–34.0)
MCHC: 34.3 g/dL (ref 30.0–36.0)
MCV: 89.6 fL (ref 80.0–100.0)
Monocytes Absolute: 0.7 10*3/uL (ref 0.1–1.0)
Monocytes Relative: 9 %
Neutro Abs: 4.5 10*3/uL (ref 1.7–7.7)
Neutrophils Relative %: 54 %
Platelets: 215 10*3/uL (ref 150–400)
RBC: 4.72 MIL/uL (ref 4.22–5.81)
RDW: 12.2 % (ref 11.5–15.5)
WBC: 8.1 10*3/uL (ref 4.0–10.5)
nRBC: 0 % (ref 0.0–0.2)

## 2022-06-05 NOTE — Plan of Care (Signed)
  Problem: Education: Goal: Knowledge of General Education information will improve Description: Including pain rating scale, medication(s)/side effects and non-pharmacologic comfort measures Outcome: Progressing   Problem: Nutrition: Goal: Adequate nutrition will be maintained Outcome: Progressing   Problem: Activity: Goal: Risk for activity intolerance will decrease Outcome: Not Progressing

## 2022-06-05 NOTE — Progress Notes (Signed)
[facility] MD Progress Note  06/05/2022 4:29 PM Travis Sandoval  MRN:  [id number] Subjective: Follow-up 42 year old man with schizophrenia.  No new complaints.  No changes to behavior.  Stays in his room most of the time but takes his medicine.  No report of any suicidal ideation.  Patient is still here in the hospital because Travis Sandoval is unable to care for himself independently and we have no place to discharge him Principal Problem: Schizophrenia ([place]) Diagnosis: Principal Problem:   Schizophrenia ([place]) Active Problems:   Psychosis ([place])  Total Time spent with patient: 20 minutes  Past Psychiatric History: Past history of schizophrenia  Past Medical History:  Past Medical History:  Diagnosis Date   ADHD    Bipolar 1 disorder ([place])    Hepatitis C    Schizoaffective disorder ([place])    History reviewed. No pertinent surgical history. Family History:  Family History  Family history unknown: Yes   Family Psychiatric  History: See previous Social History:  Social History   Substance and Sexual Activity  Alcohol Use Not Currently     Social History   Substance and Sexual Activity  Drug Use Not Currently    Social History   Socioeconomic History   Marital status: Unknown    Spouse name: Not on file   Number of children: Not on file   Years of education: Not on file   Highest education level: Not on file  Occupational History   Not on file  Tobacco Use   Smoking status: Every Day    Packs/day: 0.25    Years: 15.00    Total pack years: 3.75    Types: Cigarettes   Smokeless tobacco: Not on file  Vaping Use   Vaping Use: Not on file  Substance and Sexual Activity   Alcohol use: Not Currently   Drug use: Not Currently   Sexual activity: Not Currently  Other Topics Concern   Not on file  Social History Narrative   Not on file   Social Determinants of Health   Financial Resource Strain: Not on file  Food Insecurity: Unknown (05/18/2022)   Hunger Vital Sign    Worried About  Running Out of Food in the Last Year: Patient refused    [place] in the Last Year: Patient refused  Transportation Needs: Unknown (05/18/2022)   [place] - Hydrologist (Medical): Patient refused    Lack of Transportation (Non-Medical): Patient refused  Physical Activity: Not on file  Stress: Not on file  Social Connections: Not on file   Additional Social History:                         Sleep: Fair  Appetite:  Fair  Current Medications: Current Facility-Administered Medications  Medication Dose Route Frequency Provider Last Rate Last Admin   acetaminophen (TYLENOL) tablet 650 mg  650 mg Oral Q6H PRN [name], NP       alum & mag hydroxide-simeth (MAALOX/MYLANTA) 200-200-20 MG/5ML suspension 30 mL  30 mL Oral Q4H PRN [name], NP   30 mL at 05/22/22 0816   cloZAPine (CLOZARIL) tablet 250 mg  250 mg Oral QHS Aariana Shankland T, MD   250 mg at 06/04/22 2101   famotidine (PEPCID) tablet 20 mg  20 mg Oral BID Ayano Douthitt T, MD   20 mg at 06/05/22 0756   hydrOXYzine (ATARAX) tablet 50 mg  50 mg Oral Q6H PRN  Jerimie Mancuso, [name], MD   50 mg at 05/17/22 0944   magnesium hydroxide (MILK OF MAGNESIA) suspension 30 mL  30 mL Oral Daily PRN [name], NP   30 mL at 05/27/22 2138   nicotine polacrilex (NICORETTE) gum 2 mg  2 mg Oral PRN Rebel Willcutt, [name], MD   2 mg at 04/23/22 2200   paliperidone (INVEGA SUSTENNA) injection 156 mg  156 mg Intramuscular Q28 days Dylan Ruotolo, [name], MD   156 mg at 05/30/22 1100   ziprasidone (GEODON) injection 20 mg  20 mg Intramuscular Q12H PRN [name], MD        Lab Results:  Results for orders placed or performed during the hospital encounter of 04/16/22 (from the past 48 hour(s))  CBC with Differential/Platelet     Status: None   Collection Time: 06/05/22 11:38 AM  Result Value Ref Range   WBC 8.1 4.0 - 10.5 K/uL   RBC 4.72 4.22 - 5.81 MIL/uL   Hemoglobin 14.5 13.0 - 17.0 g/dL   HCT 42.3  39.0 - 52.0 %   MCV 89.6 80.0 - 100.0 fL   MCH 30.7 26.0 - 34.0 pg   MCHC 34.3 30.0 - 36.0 g/dL   RDW 12.2 11.5 - 15.5 %   Platelets 215 150 - 400 K/uL   nRBC 0.0 0.0 - 0.2 %   Neutrophils Relative % 54 %   Neutro Abs 4.5 1.7 - 7.7 K/uL   Lymphocytes Relative 34 %   Lymphs Abs 2.7 0.7 - 4.0 K/uL   Monocytes Relative 9 %   Monocytes Absolute 0.7 0.1 - 1.0 K/uL   Eosinophils Relative 2 %   Eosinophils Absolute 0.2 0.0 - 0.5 K/uL   Basophils Relative 1 %   Basophils Absolute 0.1 0.0 - 0.1 K/uL   Immature Granulocytes 0 %   Abs Immature Granulocytes 0.02 0.00 - 0.07 K/uL    Comment: Performed at [facility], [address] [place], [place] [address]    Blood Alcohol level:  Lab Results  Component Value Date   [facility] <10 [phone]    Metabolic Disorder Labs: Lab Results  Component Value Date   HGBA1C 5.5 04/24/2022   MPG 111 04/24/2022   No results found for: "PROLACTIN" Lab Results  Component Value Date   CHOL 220 (H) 04/24/2022   TRIG 700 (H) 04/24/2022   HDL 35 (L) 04/24/2022   CHOLHDL 6.3 04/24/2022   VLDL UNABLE TO CALCULATE IF TRIGLYCERIDE OVER 400 mg/dL 04/24/2022   LDLCALC UNABLE TO CALCULATE IF TRIGLYCERIDE OVER 400 mg/dL 04/24/2022    Physical Findings: AIMS: Facial and Oral Movements Muscles of Facial Expression: None, normal Lips and Perioral Area: None, normal Jaw: None, normal Tongue: None, normal,Extremity Movements Upper (arms, wrists, hands, fingers): None, normal Lower (legs, knees, ankles, toes): None, normal, Trunk Movements Neck, shoulders, hips: None, normal, Overall Severity Severity of abnormal movements (highest score from questions above): None, normal Incapacitation due to abnormal movements: None, normal Patient's awareness of abnormal movements (rate only patient's report): No Awareness, Dental Status Current problems with teeth and/or dentures?: No Does patient usually wear dentures?: No  CIWA:    COWS:      Musculoskeletal: Strength & Muscle Tone: within normal limits Gait & Station: normal Patient leans: N/A  Psychiatric Specialty Exam:  Presentation  General Appearance:  Disheveled  Eye Contact: Minimal  Speech: Pressured  Speech Volume: Normal  Handedness: Right   Mood and Affect  Mood: Euphoric; Irritable  Affect: Inappropriate  Thought Process  Thought Processes: Disorganized  Descriptions of Associations:Loose  Orientation:Partial  Thought Content:Illogical; Scattered  History of Schizophrenia/Schizoaffective disorder:No data recorded Duration of Psychotic Symptoms:No data recorded Hallucinations:No data recorded Ideas of Reference:Delusions; Paranoia  Suicidal Thoughts:No data recorded Homicidal Thoughts:No data recorded  Sensorium  Memory: Immediate Poor; Recent Poor; Remote Poor  Judgment: Poor  Insight: Poor   Executive Functions  Concentration: Fair  Attention Span: Fair  Recall: Poor  Fund of Knowledge: Poor  Language: Poor   Psychomotor Activity  Psychomotor Activity:No data recorded  Assets  Assets: Desire for Improvement; Social Support   Sleep  Sleep:No data recorded   Physical Exam: Physical Exam Vitals and nursing note reviewed.  Constitutional:      Appearance: Normal appearance.  HENT:     Head: Normocephalic and atraumatic.     Mouth/Throat:     Pharynx: Oropharynx is clear.  Eyes:     Pupils: Pupils are equal, round, and reactive to light.  Cardiovascular:     Rate and Rhythm: Normal rate and regular rhythm.  Pulmonary:     Effort: Pulmonary effort is normal.     Breath sounds: Normal breath sounds.  Abdominal:     General: Abdomen is flat.     Palpations: Abdomen is soft.  Musculoskeletal:        General: Normal range of motion.  Skin:    General: Skin is warm and dry.  Neurological:     General: No focal deficit present.     Mental Status: Travis Sandoval is alert. Mental status is at  baseline.  Psychiatric:        Attention and Perception: Attention normal.        Mood and Affect: Mood normal. Affect is blunt.        Speech: Speech is delayed.        Behavior: Behavior is slowed.        Thought Content: Thought content normal.    Review of Systems  Constitutional: Negative.   HENT: Negative.    Eyes: Negative.   Respiratory: Negative.    Cardiovascular: Negative.   Gastrointestinal: Negative.   Musculoskeletal: Negative.   Skin: Negative.   Neurological: Negative.   Psychiatric/Behavioral: Negative.     Blood pressure (!) 129/93, pulse (!) 126, temperature 98.3 F (36.8 C), temperature source Oral, resp. rate 18, height 5' 9"$  (1.753 m), weight 96.4 kg, SpO2 96 %. Body mass index is 31.38 kg/m.   Treatment Plan Summary: Plan no change to diagnosis or treatment plan continue medication.  Waiting for placement  Alethia Berthold, MD 06/05/2022, 4:29 PM

## 2022-06-05 NOTE — Group Note (Signed)
LCSW Group Therapy Note  Group Date: 06/05/2022 Start Time: 1300 End Time: 1400   Type of Therapy and Topic:  Group Therapy - Healthy vs Unhealthy Coping Skills  Participation Level:  Did Not Attend   Description of Group The focus of this group was to determine what unhealthy coping techniques typically are used by group members and what healthy coping techniques would be helpful in coping with various problems. Patients were guided in becoming aware of the differences between healthy and unhealthy coping techniques. Patients were asked to identify 2-3 healthy coping skills they would like to learn to use more effectively.  Therapeutic Goals Patients learned that coping is what human beings do all day long to deal with various situations in their lives Patients defined and discussed healthy vs unhealthy coping techniques Patients identified their preferred coping techniques and identified whether these were healthy or unhealthy Patients determined 2-3 healthy coping skills they would like to become more familiar with and use more often. Patients provided support and ideas to each other   Summary of Patient Progress:   Patient did not attend group despite encouraged participation.   Therapeutic Modalities Cognitive Behavioral Therapy Motivational Interviewing  Larose Kells 06/05/2022  2:35 PM

## 2022-06-05 NOTE — Progress Notes (Signed)
Patient calm and pleasant during assessment denying SI/HI/AVH. Pt observed interacting appropriately with staff and peers on the unit. Pt compliant with medication administration per MD orders. Pt given education, support, and encouragement to be acitve in his treatment plan. Pt being monitored Q 15 minutes for safety per unit protocol, remains safe on the unit

## 2022-06-05 NOTE — BHH Group Notes (Signed)
[place] Group Notes:  (Nursing/MHT/Case Management/Adjunct)  Date:  06/05/2022  Time:  4:29 PM  Type of Therapy:  Psychoeducational Skills  Participation Level:  Did Not Attend   Adela Lank [facility] 06/05/2022, 4:29 PM

## 2022-06-05 NOTE — BHH Group Notes (Signed)
[place] Group Notes:  (Nursing/MHT/Case Management/Adjunct)  Date:  06/05/2022  Time:  10:02 AM  Type of Therapy:   Community Group  Participation Level:  Did Not Attend  Participation Quality:    Affect:    Cognitive:    Insight:    Engagement in Group:    Modes of Intervention:    Summary of Progress/Problems:  Travis Sandoval 06/05/2022, 10:02 AM

## 2022-06-05 NOTE — Group Note (Signed)
Recreation Therapy Group Note   Group Topic:Self-Esteem  Group Date: 06/05/2022 Start Time: 1000 End Time: 1035 Facilitators: Vilma Prader, LRT, CTRS Location:  Dayroom  Group Description: Patients and LRT discussed the importance of self-love and self-esteem. Pt completed a worksheet that helps them identify 24 different strengths and qualities about themselves. Pt encouraged to read aloud at least 3 off their sheet to the group. Pt's then had the choice to play "Positive Affirmation Bingo" afterwards, with journals or stress balls as bingo prizes.   Affect/Mood: N/A   Participation Level: Did not attend    Clinical Observations/Individualized Feedback: [place] did not attend group due to resting in their room.   Plan: Continue to engage patient in RT group sessions 2-3x/week.   Vilma Prader, LRT, CTRS 06/05/2022 11:03 AM

## 2022-06-05 NOTE — Consult Note (Signed)
Pharmacy Consult - Clozapine     42 yo male ordered clozapine 100 mg PO daily  This patient's order has been reviewed for prescribing contraindications.    Clozapine REMS enrollment Verified: yes on 04/17/22 REMS patient ID: [id number] Current Outpatient Monitoring: Every 2 weeks   Home Regimen:  225 mg po BID Last dose: unknown   Dose Adjustments This Admission: Dose started at clozapine 100 mg po daily Dose is currently clozapine 250 mg po HS (started 1/22)   Labs: Date    ANC    Submitted? 12/27 2800 Yes 01/03 3500 Yes 01/10   3100 Yes 01/24 3000 Yes 01/31 4400 yes   02/07 4300 yes 02/14 4500 yes  Plan: Continue clozapine 250 mg po HS Monitor ANC at least weekly while inpatient     Tollie Eth, PharmD Clinical Pharmacist  06/05/2022 12:01 PM

## 2022-06-05 NOTE — Progress Notes (Signed)
Pt denies SI/HI/AVH and verbally agrees to approach staff if these become apparent or before harming themselves/others. Rates depression 0/10. Rates anxiety 0/10. Rates pain 0/10.  Scheduled medications administered to pt, per MD orders. RN provided support and encouragement to pt. Q15 min safety checks implemented and continued. Pt safe on the unit. RN will continue to monitor and intervene as needed.  06/05/22 0756  Psych Admission Type (Psych Patients Only)  Admission Status Voluntary  Psychosocial Assessment  Patient Complaints None  Eye Contact Fair  Facial Expression Flat;Sad  Affect Flat;Sad  Speech Logical/coherent  Interaction Isolative;Minimal;Forwards little  Motor Activity Slow  Appearance/Hygiene Poor hygiene;Disheveled  Behavior Characteristics Cooperative;Appropriate to situation;Calm  Mood Pleasant  Aggressive Behavior  Effect No apparent injury  Thought Process  Coherency Circumstantial  Content WDL  Delusions None reported or observed  Perception Hallucinations  Hallucination None reported or observed  Judgment Impaired  Confusion None  Danger to Self  Current suicidal ideation? Denies  Danger to Others  Danger to Others None reported or observed

## 2022-06-06 DIAGNOSIS — F203 Undifferentiated schizophrenia: Secondary | ICD-10-CM | POA: Diagnosis not present

## 2022-06-06 NOTE — Progress Notes (Signed)
[facility] MD Progress Note  06/06/2022 2:45 PM Travis Sandoval  MRN:  [id number] Subjective: Stable no change Principal Problem: Schizophrenia ([place]) Diagnosis: Principal Problem:   Schizophrenia ([place]) Active Problems:   Psychosis ([place])  Total Time spent with patient: 15 minutes  Past Psychiatric History: History of schizophrenia  Past Medical History:  Past Medical History:  Diagnosis Date   ADHD    Bipolar 1 disorder ([place])    Hepatitis C    Schizoaffective disorder ([place])    History reviewed. No pertinent surgical history. Family History:  Family History  Family history unknown: Yes   Family Psychiatric  History: See above Social History:  Social History   Substance and Sexual Activity  Alcohol Use Not Currently     Social History   Substance and Sexual Activity  Drug Use Not Currently    Social History   Socioeconomic History   Marital status: Unknown    Spouse name: Not on file   Number of children: Not on file   Years of education: Not on file   Highest education level: Not on file  Occupational History   Not on file  Tobacco Use   Smoking status: Every Day    Packs/day: 0.25    Years: 15.00    Total pack years: 3.75    Types: Cigarettes   Smokeless tobacco: Not on file  Vaping Use   Vaping Use: Not on file  Substance and Sexual Activity   Alcohol use: Not Currently   Drug use: Not Currently   Sexual activity: Not Currently  Other Topics Concern   Not on file  Social History Narrative   Not on file   Social Determinants of Health   Financial Resource Strain: Not on file  Food Insecurity: Unknown (05/18/2022)   Hunger Vital Sign    Worried About Running Out of Food in the Last Year: Patient refused    [place] in the Last Year: Patient refused  Transportation Needs: Unknown (05/18/2022)   [place] - Hydrologist (Medical): Patient refused    Lack of Transportation (Non-Medical): Patient refused  Physical Activity:  Not on file  Stress: Not on file  Social Connections: Not on file   Additional Social History:                         Sleep: Fair  Appetite:  Fair  Current Medications: Current Facility-Administered Medications  Medication Dose Route Frequency Provider Last Rate Last Admin   acetaminophen (TYLENOL) tablet 650 mg  650 mg Oral Q6H PRN [name], NP       alum & mag hydroxide-simeth (MAALOX/MYLANTA) 200-200-20 MG/5ML suspension 30 mL  30 mL Oral Q4H PRN [name], NP   30 mL at 05/22/22 0816   cloZAPine (CLOZARIL) tablet 250 mg  250 mg Oral QHS Carley Glendenning T, MD   250 mg at 06/05/22 2117   famotidine (PEPCID) tablet 20 mg  20 mg Oral BID Yamilka Lopiccolo T, MD   20 mg at 06/06/22 0751   hydrOXYzine (ATARAX) tablet 50 mg  50 mg Oral Q6H PRN Jaeliana Lococo T, MD   50 mg at 05/17/22 0944   magnesium hydroxide (MILK OF MAGNESIA) suspension 30 mL  30 mL Oral Daily PRN [name], NP   30 mL at 06/06/22 0751   nicotine polacrilex (NICORETTE) gum 2 mg  2 mg Oral PRN Lord Lancour, [name], MD  2 mg at 04/23/22 2200   paliperidone (INVEGA SUSTENNA) injection 156 mg  156 mg Intramuscular Q28 days Oreste Majeed, [name], MD   156 mg at 05/30/22 1100   ziprasidone (GEODON) injection 20 mg  20 mg Intramuscular Q12H PRN [name], MD        Lab Results:  Results for orders placed or performed during the hospital encounter of 04/16/22 (from the past 48 hour(s))  CBC with Differential/Platelet     Status: None   Collection Time: 06/05/22 11:38 AM  Result Value Ref Range   WBC 8.1 4.0 - 10.5 K/uL   RBC 4.72 4.22 - 5.81 MIL/uL   Hemoglobin 14.5 13.0 - 17.0 g/dL   HCT 42.3 39.0 - 52.0 %   MCV 89.6 80.0 - 100.0 fL   MCH 30.7 26.0 - 34.0 pg   MCHC 34.3 30.0 - 36.0 g/dL   RDW 12.2 11.5 - 15.5 %   Platelets 215 150 - 400 K/uL   nRBC 0.0 0.0 - 0.2 %   Neutrophils Relative % 54 %   Neutro Abs 4.5 1.7 - 7.7 K/uL   Lymphocytes Relative 34 %   Lymphs Abs 2.7 0.7 - 4.0 K/uL    Monocytes Relative 9 %   Monocytes Absolute 0.7 0.1 - 1.0 K/uL   Eosinophils Relative 2 %   Eosinophils Absolute 0.2 0.0 - 0.5 K/uL   Basophils Relative 1 %   Basophils Absolute 0.1 0.0 - 0.1 K/uL   Immature Granulocytes 0 %   Abs Immature Granulocytes 0.02 0.00 - 0.07 K/uL    Comment: Performed at [facility], [address] [place], [place] [address]    Blood Alcohol level:  Lab Results  Component Value Date   [facility] <10 0000000    Metabolic Disorder Labs: Lab Results  Component Value Date   HGBA1C 5.5 04/24/2022   MPG 111 04/24/2022   No results found for: "PROLACTIN" Lab Results  Component Value Date   CHOL 220 (H) 04/24/2022   TRIG 700 (H) 04/24/2022   HDL 35 (L) 04/24/2022   CHOLHDL 6.3 04/24/2022   VLDL UNABLE TO CALCULATE IF TRIGLYCERIDE OVER 400 mg/dL 04/24/2022   LDLCALC UNABLE TO CALCULATE IF TRIGLYCERIDE OVER 400 mg/dL 04/24/2022    Physical Findings: AIMS: Facial and Oral Movements Muscles of Facial Expression: None, normal Lips and Perioral Area: None, normal Jaw: None, normal Tongue: None, normal,Extremity Movements Upper (arms, wrists, hands, fingers): None, normal Lower (legs, knees, ankles, toes): None, normal, Trunk Movements Neck, shoulders, hips: None, normal, Overall Severity Severity of abnormal movements (highest score from questions above): None, normal Incapacitation due to abnormal movements: None, normal Patient's awareness of abnormal movements (rate only patient's report): No Awareness, Dental Status Current problems with teeth and/or dentures?: No Does patient usually wear dentures?: No  CIWA:    COWS:     Musculoskeletal: Strength & Muscle Tone: within normal limits Gait & Station: normal Patient leans: N/A  Psychiatric Specialty Exam:  Presentation  General Appearance:  Disheveled  Eye Contact: Minimal  Speech: Pressured  Speech Volume: Normal  Handedness: Right   Mood and Affect  Mood: Euphoric;  Irritable  Affect: Inappropriate   Thought Process  Thought Processes: Disorganized  Descriptions of Associations:Loose  Orientation:Partial  Thought Content:Illogical; Scattered  History of Schizophrenia/Schizoaffective disorder:No data recorded Duration of Psychotic Symptoms:No data recorded Hallucinations:No data recorded Ideas of Reference:Delusions; Paranoia  Suicidal Thoughts:No data recorded Homicidal Thoughts:No data recorded  Sensorium  Memory: Immediate Poor; Recent Poor; Remote Poor  Judgment: Poor  Insight: Poor   Community education officer  Concentration: Fair  Attention Span: Fair  Recall: Poor  Fund of Knowledge: Poor  Language: Poor   Psychomotor Activity  Psychomotor Activity:No data recorded  Assets  Assets: Desire for Improvement; Social Support   Sleep  Sleep:No data recorded   Physical Exam: Physical Exam Vitals and nursing note reviewed.  Constitutional:      Appearance: Normal appearance.  HENT:     Head: Normocephalic and atraumatic.     Mouth/Throat:     Pharynx: Oropharynx is clear.  Eyes:     Pupils: Pupils are equal, round, and reactive to light.  Cardiovascular:     Rate and Rhythm: Normal rate and regular rhythm.  Pulmonary:     Effort: Pulmonary effort is normal.     Breath sounds: Normal breath sounds.  Abdominal:     General: Abdomen is flat.     Palpations: Abdomen is soft.  Musculoskeletal:        General: Normal range of motion.  Skin:    General: Skin is warm and dry.  Neurological:     General: No focal deficit present.     Mental Status: He is alert. Mental status is at baseline.  Psychiatric:        Mood and Affect: Mood normal.        Thought Content: Thought content normal.    Review of Systems  Constitutional: Negative.   HENT: Negative.    Eyes: Negative.   Respiratory: Negative.    Cardiovascular: Negative.   Gastrointestinal: Negative.   Musculoskeletal: Negative.   Skin:  Negative.   Neurological: Negative.   Psychiatric/Behavioral: Negative.     Blood pressure 111/85, pulse (!) 106, temperature 97.7 F (36.5 C), temperature source Oral, resp. rate 18, height 5' 9"$  (1.753 m), weight 96.4 kg, SpO2 96 %. Body mass index is 31.38 kg/m.   Treatment Plan Summary: Plan no change to clinical condition.  Waiting for discharge.  Alethia Berthold, MD 06/06/2022, 2:45 PM

## 2022-06-06 NOTE — Group Note (Signed)
[facility] LCSW Group Therapy Note   Group Date: 06/06/2022 Start Time: 1300 End Time: 1400   Type of Therapy/Topic:  Group Therapy:  Balance in Life  Participation Level:  Did Not Attend   Description of Group:    This group will address the concept of balance and how it feels and looks when one is unbalanced. Patients will be encouraged to process areas in their lives that are out of balance, and identify reasons for remaining unbalanced. Facilitators will guide patients utilizing problem- solving interventions to address and correct the stressor making their life unbalanced. Understanding and applying boundaries will be explored and addressed for obtaining  and maintaining a balanced life. Patients will be encouraged to explore ways to assertively make their unbalanced needs known to significant others in their lives, using other group members and facilitator for support and feedback.  Therapeutic Goals: Patient will identify two or more emotions or situations they have that consume much of in their lives. Patient will identify signs/triggers that life has become out of balance:  Patient will identify two ways to set boundaries in order to achieve balance in their lives:  Patient will demonstrate ability to communicate their needs through discussion and/or role plays  Summary of Patient Progress: X   Therapeutic Modalities:   Cognitive Behavioral Therapy Solution-Focused Therapy Assertiveness Training   Shirl Harris, LCSW

## 2022-06-06 NOTE — Plan of Care (Signed)
Patient stayed in his room except for meals. Denies SI,HI and AVH. No issues verbalized. Appetite and energy level good. Compliant with medications. Support and encouragement given.

## 2022-06-06 NOTE — Group Note (Signed)
Recreation Therapy Group Note   Group Topic:Relaxation  Group Date: 06/06/2022 Start Time: 1000 End Time: 1045 Facilitators: Vilma Prader, LRT, CTRS Location:  Craft Room  Group Description: PMR (Progressive Muscle Relaxation). LRT asks patients their current level of stress/anxiety from 1-10, with 10 being the highest. LRT educates patients on what PMR is and the benefits that come from it. Patients are asked to sit with their feet flat on the floor while sitting up and all the way back in their chair, if possible. LRT follows prompt that requires the patients to tense and release different muscles in their body and focus on their breathing. During session, lights are off and soft music is being played. At the end of the prompt, LRT asks patients to rank their current levels of stress/anxiety from 1-10, 10 being the highest.    Affect/Mood: N/A   Participation Level: Did not attend    Clinical Observations/Individualized Feedback: Travis Sandoval did not attend group due to resting in his room.   Plan: Continue to engage patient in RT group sessions 2-3x/week.   Vilma Prader, LRT, CTRS  06/06/2022 11:01 AM

## 2022-06-06 NOTE — Progress Notes (Signed)
Patient calm and pleasant during assessment denying SI/HI/AVH. Pt observed interacting appropriately with staff and peers on the unit. Pt compliant with medication administration per MD orders. Pt given education, support, and encouragement to be acitve in his treatment plan. Pt being monitored Q 15 minutes for safety per unit protocol, remains safe on the unit

## 2022-06-06 NOTE — Group Note (Unsigned)
Date:  06/06/2022 Time:  9:38 PM  Group Topic/Focus:  Wrap-Up Group:   The focus of this group is to help patients review their daily goal of treatment and discuss progress on daily workbooks.     Participation Level:  {BHH PARTICIPATION WO:[id number]  Participation Quality:  {BHH PARTICIPATION QUALITY:22265}  Affect:  {BHH AFFECT:22266}  Cognitive:  {BHH COGNITIVE:22267}  Insight: {BHH Insight2:20797}  Engagement in Group:  {BHH ENGAGEMENT IN BP:8198245  Modes of Intervention:  {BHH MODES OF INTERVENTION:22269}  Additional Comments:  ***  Denym Christenberry 06/06/2022, 9:38 PM

## 2022-06-06 NOTE — BH IP Treatment Plan (Signed)
Interdisciplinary Treatment and Diagnostic Plan Update  06/06/2022 Time of Session: 08:30 Travis Sandoval MRN: [id number]  Principal Diagnosis: Schizophrenia [facility])  Secondary Diagnoses: Principal Problem:   Schizophrenia ([place]) Active Problems:   Psychosis ([place])   Current Medications:  Current Facility-Administered Medications  Medication Dose Route Frequency Provider Last Rate Last Admin   acetaminophen (TYLENOL) tablet 650 mg  650 mg Oral Q6H PRN [name], NP       alum & mag hydroxide-simeth (MAALOX/MYLANTA) 200-200-20 MG/5ML suspension 30 mL  30 mL Oral Q4H PRN [name], NP   30 mL at 05/22/22 0816   cloZAPine (CLOZARIL) tablet 250 mg  250 mg Oral QHS [name], [name], MD   250 mg at 06/05/22 2117   famotidine (PEPCID) tablet 20 mg  20 mg Oral BID [name], [name], MD   20 mg at 06/06/22 0751   hydrOXYzine (ATARAX) tablet 50 mg  50 mg Oral Q6H PRN [name], [name], MD   50 mg at 05/17/22 0944   magnesium hydroxide (MILK OF MAGNESIA) suspension 30 mL  30 mL Oral Daily PRN [name], NP   30 mL at 06/06/22 0751   nicotine polacrilex (NICORETTE) gum 2 mg  2 mg Oral PRN [name], [name], MD   2 mg at 04/23/22 2200   paliperidone (INVEGA SUSTENNA) injection 156 mg  156 mg Intramuscular Q28 days [name], [name], MD   156 mg at 05/30/22 1100   ziprasidone (GEODON) injection 20 mg  20 mg Intramuscular Q12H PRN [name], MD       PTA Medications: Medications Prior to Admission  Medication Sig Dispense Refill Last Dose   ABILIFY MAINTENA 400 MG SRER injection Inject 400 mg into the muscle every 28 (twenty-eight) days.      ARIPiprazole (ABILIFY) 10 MG tablet Take 10 mg by mouth daily. (Patient not taking: Reported on 04/16/2022)      atomoxetine (STRATTERA) 40 MG capsule Take 40 mg by mouth daily. (Patient not taking: Reported on 04/16/2022)      atorvastatin (LIPITOR) 40 MG tablet Take 40 mg by mouth daily.      budesonide-formoterol (SYMBICORT) 160-4.5 MCG/ACT  inhaler Inhale 2 puffs into the lungs.      cetirizine (ZYRTEC) 10 MG tablet Take 10 mg by mouth daily.      clozapine (CLOZARIL) 200 MG tablet Take 200 mg by mouth 2 (two) times daily. Take along with one 25 mg tablet for total 225 mg twice daily      cloZAPine (CLOZARIL) 25 MG tablet Take 25 mg by mouth 2 (two) times daily. Take along with one 200 mg tablet for total 225 mg twice daily      Dexlansoprazole (DEXILANT) 30 MG capsule Take 30 mg by mouth daily. (Patient not taking: Reported on 04/16/2022)      hydrOXYzine (VISTARIL) 50 MG capsule Take 50 mg by mouth 3 (three) times daily as needed for itching. (Patient not taking: Reported on 04/16/2022)      Melatonin 5 MG TABS Take 5 mg by mouth at bedtime as needed (sleep).      metoprolol succinate (TOPROL-XL) 25 MG 24 hr tablet Take 12.5 mg by mouth daily.      Phosphatidyl Choline 65 MG TABS Take 1 tablet by mouth daily.  (Patient not taking: Reported on 04/16/2022)      valproic acid (DEPAKENE) 250 MG/5ML solution Take 750-1,000 mLs by mouth 2 (two) times daily. 1000 mg (20 mL) every morning and 750  mg (15 mL) daily at bedtime       Patient Stressors: Other: Psychosis    Patient Strengths: Ability for insight  Active sense of humor  Average or above average intelligence  Capable of independent living  Supportive family/friends   Treatment Modalities: Medication Management, Group therapy, Case management,  1 to 1 session with clinician, Psychoeducation, Recreational therapy.   Physician Treatment Plan for Primary Diagnosis: Schizophrenia ([place]) Long Term Goal(s):     Short Term Goals: None, pt is a poor hx.  Medication Management: Evaluate patient's response, side effects, and tolerance of medication regimen.  Therapeutic Interventions: 1 to 1 sessions, Unit Group sessions and Medication administration.  Evaluation of Outcomes: Progressing  Physician Treatment Plan for Secondary Diagnosis: Principal Problem:   Schizophrenia  ([place]) Active Problems:   Psychosis ([place])  Long Term Goal(s):     Short Term Goals: None, pt is a poor hx.     Medication Management: Evaluate patient's response, side effects, and tolerance of medication regimen.  Therapeutic Interventions: 1 to 1 sessions, Unit Group sessions and Medication administration.  Evaluation of Outcomes: Progressing   RN Treatment Plan for Primary Diagnosis: Schizophrenia ([place]) Long Term Goal(s): Knowledge of disease and therapeutic regimen to maintain health will improve  Short Term Goals: Ability to remain free from injury will improve, Ability to verbalize frustration and anger appropriately will improve, Ability to demonstrate self-control, Ability to participate in decision making will improve, Ability to verbalize feelings will improve, Ability to disclose and discuss suicidal ideas, Ability to identify and develop effective coping behaviors will improve, and Compliance with prescribed medications will improve  Medication Management: RN will administer medications as ordered by provider, will assess and evaluate patient's response and provide education to patient for prescribed medication. RN will report any adverse and/or side effects to prescribing provider.  Therapeutic Interventions: 1 on 1 counseling sessions, Psychoeducation, Medication administration, Evaluate responses to treatment, Monitor vital signs and CBGs as ordered, Perform/monitor CIWA, COWS, AIMS and Fall Risk screenings as ordered, Perform wound care treatments as ordered.  Evaluation of Outcomes: Progressing   LCSW Treatment Plan for Primary Diagnosis: Schizophrenia ([place]) Long Term Goal(s): Safe transition to appropriate next level of care at discharge, Engage patient in therapeutic group addressing interpersonal concerns.  Short Term Goals: Engage patient in aftercare planning with referrals and resources, Increase social support, Increase ability to appropriately verbalize feelings,  Increase emotional regulation, Facilitate acceptance of mental health diagnosis and concerns, and Increase skills for wellness and recovery  Therapeutic Interventions: Assess for all discharge needs, 1 to 1 time with Social worker, Explore available resources and support systems, Assess for adequacy in community support network, Educate family and significant other(s) on suicide prevention, Complete Psychosocial Assessment, Interpersonal group therapy.  Evaluation of Outcomes: Progressing   Progress in Treatment: Attending groups: No. Participating in groups: No. Taking medication as prescribed: Yes. Toleration medication: Yes. Family/Significant other contact made: Yes, individual(s) contacted:  father, Ranardo Mutch. Patient understands diagnosis: Yes. Discussing patient identified problems/goals with staff: Yes. Medical problems stabilized or resolved: Yes. Denies suicidal/homicidal ideation: Yes. Issues/concerns per patient self-inventory: No. Other: none.  New problem(s) identified: No, Describe:  none Update 04/27/2022: No changes at this time.  Update 05/02/2022:   No changes at this time. Update 05/07/22: No changes at this time.  Update 05/12/22: No changes at this time. Update 05/17/2022: none Update 05/22/2022:  No changes at this time. Update 05/27/22: No changes at this time. Update 06/01/22: No changes at this time.  Update 06/06/22: No changes at this time.     New Short Term/Long Term Goal(s): Patient to work towards elimination of symptoms of psychosis, medication management for mood stabilization; elimination of SI thoughts; development of comprehensive mental wellness plan. Update 04/27/2022: No changes at this time.  Update 05/02/2022:   No changes at this time. Update 05/07/22: No changes at this time. Update 05/12/22: No changes at this time. Update 05/17/2022: Patient to work towards elimination of symptoms of psychosis, medication management for mood stabilization; development of  comprehensive mental wellness plan.  Update 05/22/2022:  No changes at this time. Update 05/27/22: Patient to work towards detox, elimination of symptoms of psychosis, medication management for mood stabilization; elimination of SI thoughts; development of comprehensive mental wellness/sobriety plan. Update 06/01/22: No changes at this time. Update 06/06/22: No changes at this time.      Patient Goals:  No additional goals identified at this time. Patient to continue to work towards original goals identified in initial treatment team meeting. CSW will remain available to patient should they voice additional treatment goals. Update 04/27/2022: No changes at this time.  Update 05/02/2022:   No changes at this time. Update 05/07/22: No changes at this time. Update 05/12/22: No changes at this time.  Update 05/17/2022: No additional goals identified at this time. Patient to continue to work towards original goals identified in initial treatment team meeting. CSW will remain available to patient should they voice additional treatment goals.   Update 05/22/2022:  No changes at this time. Update 05/27/22: No additional goals identified at this time. Patient to continue to work towards original goals identified in initial treatment team meeting. CSW will remain available to patient should they voice additional treatment goals. Update 06/01/22: No changes at this time. Update 06/06/22: No changes at this time.   Discharge Plan or Barriers: No psychosocial barriers identified at this time, patient to return to place of residence when appropriate for discharge. Update 04/27/2022: No changes at this time.  Update 05/02/2022:   Treatment team has discovered that the patient is not able to return to his group home and is in need of placement.  Meeting was held with treatment team and [employer] to assist patient in identifying appropriate housing. Update 05/07/22: No changes at this time. Update 05/12/22: No changes at this time.  Update 05/17/2022:  Patient lacks adequate housing/supervision. Patient high risk of inadvertent harm to self due to chronic psychotic features. CSW to continue pursuing placement. Situation ongoing, CSW will continue to monitor and update note as more information becomes available.  Update 05/22/2022:  Deloria Lair continues to work on identifying finanicial resources.  Placement continues to be sought. Update 05/27/22:  Patient lack adequate disposition/ supervision at [place]. CSW to provide case management and currently working with [facility] to mitigate funding barriers for placement at residential [facility] facility. Update 06/01/22: No changes at this time. Update 06/06/22: No changes at this time.   Reason for Continuation of Hospitalization: Medication stabilization Other; describe psychosis    Estimated Length of Stay: 1-7 days Update 04/27/2022: No changes at this time. Update 05/02/2022:   TBD Update 05/07/22: No changes at this time. Update 05/12/22: No changes at this time.  Update 05/17/2022: 1-7 days  Update 05/22/2022:  TBD Update 05/27/22: No changes at this time. Update 06/01/22: No changes at this time. Update 06/06/22: No changes at this time.  Last 3 [place] Suicide Severity Risk Score: Flowsheet Row Admission (Current) from 04/16/2022 in [place] ED  from 04/15/2022 in [facility] Emergency Department at [place] No Risk No Risk       Last PHQ 2/9 Scores:     No data to display          Scribe for Treatment Team: Shirl Harris, LCSW 06/06/2022 9:03 AM

## 2022-06-07 DIAGNOSIS — F203 Undifferentiated schizophrenia: Secondary | ICD-10-CM | POA: Diagnosis not present

## 2022-06-07 MED ORDER — SENNOSIDES-DOCUSATE SODIUM 8.6-50 MG PO TABS
2.0000 | ORAL_TABLET | Freq: Every day | ORAL | Status: DC | PRN
  Administered 2022-06-07 – 2022-07-14 (×5): 2 via ORAL
  Filled 2022-06-07 (×6): qty 2

## 2022-06-07 MED ORDER — DOCUSATE SODIUM 100 MG PO CAPS
100.0000 mg | ORAL_CAPSULE | Freq: Two times a day (BID) | ORAL | Status: DC
  Administered 2022-06-07 – 2022-06-23 (×32): 100 mg via ORAL
  Filled 2022-06-07 (×31): qty 1

## 2022-06-07 MED ORDER — MAGNESIUM CITRATE PO SOLN
1.0000 | Freq: Every day | ORAL | Status: DC | PRN
  Administered 2022-06-08 – 2022-06-14 (×3): 1 via ORAL
  Filled 2022-06-07 (×3): qty 296

## 2022-06-07 NOTE — Plan of Care (Signed)
D- Patient alert and oriented. Patient presented in a pleasant mood on assessment reporting that he slept good last night and had some complaints of constipation. Patient requested PRN medication to help with relief. Patient denied SI, HI, AVH, and pain at this time. Patient also denied any signs/symptoms of depression and anxiety, stating that overall, he is feeling "good". Patient had no stated goals for today.  A- Scheduled medications administered to patient, per MD orders. Support and encouragement provided.  Routine safety checks conducted every 15 minutes.  Patient informed to notify staff with problems or concerns.  R- No adverse drug reactions noted. Patient contracts for safety at this time. Patient compliant with medications. Patient receptive, calm, and cooperative. Patient isolates to room, except for meals and medication. Patient remains safe at this time.  Problem: Education: Goal: Knowledge of General Education information will improve Description: Including pain rating scale, medication(s)/side effects and non-pharmacologic comfort measures Outcome: Progressing   Problem: Health Behavior/Discharge Planning: Goal: Ability to manage health-related needs will improve Outcome: Progressing   Problem: Clinical Measurements: Goal: Ability to maintain clinical measurements within normal limits will improve Outcome: Progressing Goal: Will remain free from infection Outcome: Progressing Goal: Diagnostic test results will improve Outcome: Progressing Goal: Respiratory complications will improve Outcome: Progressing Goal: Cardiovascular complication will be avoided Outcome: Progressing   Problem: Activity: Goal: Risk for activity intolerance will decrease Outcome: Progressing   Problem: Nutrition: Goal: Adequate nutrition will be maintained Outcome: Progressing   Problem: Coping: Goal: Level of anxiety will decrease Outcome: Progressing   Problem: Elimination: Goal: Will  not experience complications related to urinary retention Outcome: Progressing   Problem: Pain Managment: Goal: General experience of comfort will improve Outcome: Progressing   Problem: Safety: Goal: Ability to remain free from injury will improve Outcome: Progressing   Problem: Skin Integrity: Goal: Risk for impaired skin integrity will decrease Outcome: Progressing   Problem: Education: Goal: Knowledge of [place] General Education information/materials will improve Outcome: Progressing Goal: Emotional status will improve Outcome: Progressing Goal: Mental status will improve Outcome: Progressing Goal: Verbalization of understanding the information provided will improve Outcome: Progressing   Problem: Activity: Goal: Interest or engagement in activities will improve Outcome: Progressing Goal: Sleeping patterns will improve Outcome: Progressing   Problem: Coping: Goal: Ability to verbalize frustrations and anger appropriately will improve Outcome: Progressing Goal: Ability to demonstrate self-control will improve Outcome: Progressing   Problem: Health Behavior/Discharge Planning: Goal: Identification of resources available to assist in meeting health care needs will improve Outcome: Progressing Goal: Compliance with treatment plan for underlying cause of condition will improve Outcome: Progressing   Problem: Physical Regulation: Goal: Ability to maintain clinical measurements within normal limits will improve Outcome: Progressing   Problem: Safety: Goal: Periods of time without injury will increase Outcome: Progressing   Problem: Activity: Goal: Will verbalize the importance of balancing activity with adequate rest periods Outcome: Progressing   Problem: Education: Goal: Will be free of psychotic symptoms Outcome: Progressing Goal: Knowledge of the prescribed therapeutic regimen will improve Outcome: Progressing   Problem: Coping: Goal: Coping ability  will improve Outcome: Progressing Goal: Will verbalize feelings Outcome: Progressing   Problem: Health Behavior/Discharge Planning: Goal: Compliance with prescribed medication regimen will improve Outcome: Progressing   Problem: Nutritional: Goal: Ability to achieve adequate nutritional intake will improve Outcome: Progressing   Problem: Role Relationship: Goal: Ability to communicate needs accurately will improve Outcome: Progressing Goal: Ability to interact with others will improve Outcome: Progressing   Problem: Safety: Goal: Ability  to redirect hostility and anger into socially appropriate behaviors will improve Outcome: Progressing Goal: Ability to remain free from injury will improve Outcome: Progressing   Problem: Self-Care: Goal: Ability to participate in self-care as condition permits will improve Outcome: Progressing   Problem: Self-Concept: Goal: Will verbalize positive feelings about self Outcome: Progressing

## 2022-06-07 NOTE — Group Note (Signed)
Recreation Therapy Group Note   Group Topic:Communication  Group Date: 06/07/2022 Start Time: 1000 End Time: [place] Facilitators: Vilma Prader, LRT, CTRS Location:  Craft Room  Group Description: Straw Bridge. In pairs, patients were given 10 plastic drinking straws and an equal length of masking tape. Using the materials provided, patients were instructed to build a free-standing bridge-like structure to suspend an everyday item (ex: puzzle box) off of the floor or table surface. All materials were required to be used by the team in their design. LRT facilitated post-activity discussion reviewing team process. Patients were encouraged to reflect how the skills used in this activity can be generalized to daily life post discharge.   Affect/Mood: N/A   Participation Level: Did not attend    Clinical Observations/Individualized Feedback: Travis Sandoval did not attend group due to resting in their room.   Plan: Continue to engage patient in RT group sessions 2-3x/week.   Vilma Prader, LRT, CTRS 06/07/2022 12:07 PM

## 2022-06-07 NOTE — Progress Notes (Signed)
[facility] MD Progress Note  06/07/2022 1:47 PM Travis Sandoval  MRN:  [id number] Subjective: Follow-up 42 year old man with schizophrenia.  Patient has no new mental health complaints.  As usual he stays in his room most of the time but is alert and talkative when I go in to see him.  Denies psychiatric symptoms.  He is complaining of constipation and Milk of Magnesia was not helpful earlier today. Principal Problem: Schizophrenia ([place]) Diagnosis: Principal Problem:   Schizophrenia ([place]) Active Problems:   Psychosis ([place])  Total Time spent with patient: 30 minutes  Past Psychiatric History: Past history of schizophrenia currently more stable  Past Medical History:  Past Medical History:  Diagnosis Date   ADHD    Bipolar 1 disorder ([place])    Hepatitis C    Schizoaffective disorder ([place])    History reviewed. No pertinent surgical history. Family History:  Family History  Family history unknown: Yes   Family Psychiatric  History: See previous Social History:  Social History   Substance and Sexual Activity  Alcohol Use Not Currently     Social History   Substance and Sexual Activity  Drug Use Not Currently    Social History   Socioeconomic History   Marital status: Unknown    Spouse name: Not on file   Number of children: Not on file   Years of education: Not on file   Highest education level: Not on file  Occupational History   Not on file  Tobacco Use   Smoking status: Every Day    Packs/day: 0.25    Years: 15.00    Total pack years: 3.75    Types: Cigarettes   Smokeless tobacco: Not on file  Vaping Use   Vaping Use: Not on file  Substance and Sexual Activity   Alcohol use: Not Currently   Drug use: Not Currently   Sexual activity: Not Currently  Other Topics Concern   Not on file  Social History Narrative   Not on file   Social Determinants of Health   Financial Resource Strain: Not on file  Food Insecurity: Unknown (05/18/2022)   Hunger Vital Sign     Worried About Running Out of Food in the Last Year: Patient refused    [place] in the Last Year: Patient refused  Transportation Needs: Unknown (05/18/2022)   [place] - Hydrologist (Medical): Patient refused    Lack of Transportation (Non-Medical): Patient refused  Physical Activity: Not on file  Stress: Not on file  Social Connections: Not on file   Additional Social History:                         Sleep: Fair  Appetite:  Fair  Current Medications: Current Facility-Administered Medications  Medication Dose Route Frequency Provider Last Rate Last Admin   acetaminophen (TYLENOL) tablet 650 mg  650 mg Oral Q6H PRN [name], NP       alum & mag hydroxide-simeth (MAALOX/MYLANTA) 200-200-20 MG/5ML suspension 30 mL  30 mL Oral Q4H PRN [name], NP   30 mL at 05/22/22 0816   cloZAPine (CLOZARIL) tablet 250 mg  250 mg Oral QHS Scarlet Abad T, MD   250 mg at 06/06/22 2106   docusate sodium (COLACE) capsule 100 mg  100 mg Oral BID Tyanne Derocher T, MD       famotidine (PEPCID) tablet 20 mg  20 mg Oral BID Birdia Jaycox T,  MD   20 mg at 06/07/22 0750   hydrOXYzine (ATARAX) tablet 50 mg  50 mg Oral Q6H PRN Bosten Newstrom, [name], MD   50 mg at 05/17/22 0944   magnesium citrate solution 1 Bottle  1 Bottle Oral Daily PRN Maurisha Mongeau T, MD       magnesium hydroxide (MILK OF MAGNESIA) suspension 30 mL  30 mL Oral Daily PRN [name], NP   30 mL at 06/07/22 0750   nicotine polacrilex (NICORETTE) gum 2 mg  2 mg Oral PRN Therman Hughlett T, MD   2 mg at 04/23/22 2200   paliperidone (INVEGA SUSTENNA) injection 156 mg  156 mg Intramuscular Q28 days [place] T, MD   156 mg at 05/30/22 1100   senna-docusate (Senokot-S) tablet 2 tablet  2 tablet Oral Daily PRN Kapri Nero, [name], MD       ziprasidone (GEODON) injection 20 mg  20 mg Intramuscular Q12H PRN [name], MD        Lab Results: No results found for this or any previous visit  (from the past 5 hour(s)).  Blood Alcohol level:  Lab Results  Component Value Date   ETH <10 0000000    Metabolic Disorder Labs: Lab Results  Component Value Date   HGBA1C 5.5 04/24/2022   MPG 111 04/24/2022   No results found for: "PROLACTIN" Lab Results  Component Value Date   CHOL 220 (H) 04/24/2022   TRIG 700 (H) 04/24/2022   HDL 35 (L) 04/24/2022   CHOLHDL 6.3 04/24/2022   VLDL UNABLE TO CALCULATE IF TRIGLYCERIDE OVER 400 mg/dL 04/24/2022   LDLCALC UNABLE TO CALCULATE IF TRIGLYCERIDE OVER 400 mg/dL 04/24/2022    Physical Findings: AIMS: Facial and Oral Movements Muscles of Facial Expression: None, normal Lips and Perioral Area: None, normal Jaw: None, normal Tongue: None, normal,Extremity Movements Upper (arms, wrists, hands, fingers): None, normal Lower (legs, knees, ankles, toes): None, normal, Trunk Movements Neck, shoulders, hips: None, normal, Overall Severity Severity of abnormal movements (highest score from questions above): None, normal Incapacitation due to abnormal movements: None, normal Patient's awareness of abnormal movements (rate only patient's report): No Awareness, Dental Status Current problems with teeth and/or dentures?: No Does patient usually wear dentures?: No  CIWA:    COWS:     Musculoskeletal: Strength & Muscle Tone: within normal limits Gait & Station: normal Patient leans: N/A  Psychiatric Specialty Exam:  Presentation  General Appearance:  Disheveled  Eye Contact: Minimal  Speech: Pressured  Speech Volume: Normal  Handedness: Right   Mood and Affect  Mood: Euphoric; Irritable  Affect: Inappropriate   Thought Process  Thought Processes: Disorganized  Descriptions of Associations:Loose  Orientation:Partial  Thought Content:Illogical; Scattered  History of Schizophrenia/Schizoaffective disorder:No data recorded Duration of Psychotic Symptoms:No data recorded Hallucinations:No data  recorded Ideas of Reference:Delusions; Paranoia  Suicidal Thoughts:No data recorded Homicidal Thoughts:No data recorded  Sensorium  Memory: Immediate Poor; Recent Poor; Remote Poor  Judgment: Poor  Insight: Poor   Executive Functions  Concentration: Fair  Attention Span: Fair  Recall: Poor  Fund of Knowledge: Poor  Language: Poor   Psychomotor Activity  Psychomotor Activity:No data recorded  Assets  Assets: Desire for Improvement; Social Support   Sleep  Sleep:No data recorded   Physical Exam: Physical Exam Vitals and nursing note reviewed.  Constitutional:      Appearance: Normal appearance.  HENT:     Head: Normocephalic and atraumatic.     Mouth/Throat:     Pharynx: Oropharynx is  clear.  Eyes:     Pupils: Pupils are equal, round, and reactive to light.  Cardiovascular:     Rate and Rhythm: Normal rate and regular rhythm.  Pulmonary:     Effort: Pulmonary effort is normal.     Breath sounds: Normal breath sounds.  Abdominal:     General: Abdomen is flat.     Palpations: Abdomen is soft.  Musculoskeletal:        General: Normal range of motion.  Skin:    General: Skin is warm and dry.  Neurological:     General: No focal deficit present.     Mental Status: He is alert. Mental status is at baseline.  Psychiatric:        Attention and Perception: Attention normal.        Mood and Affect: Mood normal. Affect is blunt.        Speech: Speech is delayed.        Behavior: Behavior is slowed.        Thought Content: Thought content normal.        Cognition and Memory: Cognition is impaired.    Review of Systems  Constitutional: Negative.   HENT: Negative.    Eyes: Negative.   Respiratory: Negative.    Cardiovascular: Negative.   Gastrointestinal:  Positive for constipation.  Musculoskeletal: Negative.   Skin: Negative.   Neurological: Negative.   Psychiatric/Behavioral: Negative.     Blood pressure 112/86, pulse 100, temperature  97.6 F (36.4 C), temperature source Oral, resp. rate 18, height 5' 9"$  (1.753 m), weight 96.4 kg, SpO2 97 %. Body mass index is 31.38 kg/m.   Treatment Plan Summary: Medication management and Plan adding Colace and then also Senokot and magnesium citrate both as needed which he can use sequentially to get some relief from the constipation.  No change to other medicine.  Mainly waiting for placement  Alethia Berthold, MD 06/07/2022, 1:47 PM

## 2022-06-07 NOTE — BHH Group Notes (Signed)
[place] Group Notes:  (Nursing/MHT/Case Management/Adjunct)  Date:  06/07/2022  Time:  3:07 PM  Type of Therapy:  Psychoeducational Skills  Participation Level:  Did Not Attend   Adela Lank [facility] 06/07/2022, 3:07 PM

## 2022-06-07 NOTE — BHH Group Notes (Signed)
[place] Group Notes:  (Nursing/MHT/Case Management/Adjunct)  Date:  06/07/2022  Time:  9:40 AM  Type of Therapy:   community meeting  Participation Level:  Did Not Attend    Antonieta Pert 06/07/2022, 9:40 AM

## 2022-06-07 NOTE — BHH Group Notes (Signed)
[place] Group Notes:  (Nursing/MHT/Case Management/Adjunct)  Date:  06/07/2022  Time:  8:48 PM  Type of Therapy:   Wrap up  Participation Level:  Active  Participation Quality:  Appropriate  Affect:  Appropriate  Cognitive:  Alert  Insight:  Good  Engagement in Group:  Engaged and no goals and want be D/C.  Modes of Intervention:  Support  Summary of Progress/Problems:  Travis Sandoval 06/07/2022, 8:48 PM

## 2022-06-08 DIAGNOSIS — F203 Undifferentiated schizophrenia: Secondary | ICD-10-CM | POA: Diagnosis not present

## 2022-06-08 NOTE — Group Note (Deleted)
LCSW Group Therapy Note   Group Date: 06/08/2022 Start Time: 1200 End Time: 1245   Type of Therapy and Topic:  Group Therapy:   Participation Level:  {BHH PARTICIPATION [id number]  Description of Group:   Therapeutic Goals:  1.     Summary of Patient Progress:    ***  Therapeutic Modalities:   Doristine Johns 06/08/2022  12:50 PM

## 2022-06-08 NOTE — Progress Notes (Signed)
[facility] MD Progress Note  06/08/2022 12:49 PM Jaun Hindmarsh  MRN:  [id number] Subjective: Follow-up patient with schizophrenia who appears to be very stable.  Still complaining of constipation not relieved by first interventions.  Denies hallucinations or suicidal ideation.  No behavior problems Principal Problem: Schizophrenia ([place]) Diagnosis: Principal Problem:   Schizophrenia ([place]) Active Problems:   Psychosis ([place])  Total Time spent with patient: 30 minutes  Past Psychiatric History: Past history of recurrent schizophrenia with chronic impairment  Past Medical History:  Past Medical History:  Diagnosis Date   ADHD    Bipolar 1 disorder ([place])    Hepatitis C    Schizoaffective disorder ([place])    History reviewed. No pertinent surgical history. Family History:  Family History  Family history unknown: Yes   Family Psychiatric  History: See previous Social History:  Social History   Substance and Sexual Activity  Alcohol Use Not Currently     Social History   Substance and Sexual Activity  Drug Use Not Currently    Social History   Socioeconomic History   Marital status: Unknown    Spouse name: Not on file   Number of children: Not on file   Years of education: Not on file   Highest education level: Not on file  Occupational History   Not on file  Tobacco Use   Smoking status: Every Day    Packs/day: 0.25    Years: 15.00    Total pack years: 3.75    Types: Cigarettes   Smokeless tobacco: Not on file  Vaping Use   Vaping Use: Not on file  Substance and Sexual Activity   Alcohol use: Not Currently   Drug use: Not Currently   Sexual activity: Not Currently  Other Topics Concern   Not on file  Social History Narrative   Not on file   Social Determinants of Health   Financial Resource Strain: Not on file  Food Insecurity: Unknown (05/18/2022)   Hunger Vital Sign    Worried About Running Out of Food in the Last Year: Patient refused    [place] in  the Last Year: Patient refused  Transportation Needs: Unknown (05/18/2022)   [place] - Hydrologist (Medical): Patient refused    Lack of Transportation (Non-Medical): Patient refused  Physical Activity: Not on file  Stress: Not on file  Social Connections: Not on file   Additional Social History:                         Sleep: Fair  Appetite:  Fair  Current Medications: Current Facility-Administered Medications  Medication Dose Route Frequency Provider Last Rate Last Admin   acetaminophen (TYLENOL) tablet 650 mg  650 mg Oral Q6H PRN [name], NP       alum & mag hydroxide-simeth (MAALOX/MYLANTA) 200-200-20 MG/5ML suspension 30 mL  30 mL Oral Q4H PRN [name], NP   30 mL at 05/22/22 0816   cloZAPine (CLOZARIL) tablet 250 mg  250 mg Oral QHS Landrum Carbonell T, MD   250 mg at 06/07/22 2118   docusate sodium (COLACE) capsule 100 mg  100 mg Oral BID Olivya Sobol T, MD   100 mg at 06/08/22 0754   famotidine (PEPCID) tablet 20 mg  20 mg Oral BID Nicolae Vasek T, MD   20 mg at 06/08/22 0754   hydrOXYzine (ATARAX) tablet 50 mg  50 mg Oral Q6H PRN Henreitta Spittler,  [name], MD   50 mg at 05/17/22 0944   magnesium citrate solution 1 Bottle  1 Bottle Oral Daily PRN Brace Welte T, MD       magnesium hydroxide (MILK OF MAGNESIA) suspension 30 mL  30 mL Oral Daily PRN [name], NP   30 mL at 06/08/22 0755   nicotine polacrilex (NICORETTE) gum 2 mg  2 mg Oral PRN Rylyn Zawistowski T, MD   2 mg at 04/23/22 2200   paliperidone (INVEGA SUSTENNA) injection 156 mg  156 mg Intramuscular Q28 days Daijon Wenke T, MD   156 mg at 05/30/22 1100   senna-docusate (Senokot-S) tablet 2 tablet  2 tablet Oral Daily PRN Beola Vasallo, [name], MD   2 tablet at 06/07/22 2118   ziprasidone (GEODON) injection 20 mg  20 mg Intramuscular Q12H PRN [name], MD        Lab Results: No results found for this or any previous visit (from the past 48 hour(s)).  Blood Alcohol  level:  Lab Results  Component Value Date   ETH <10 0000000    Metabolic Disorder Labs: Lab Results  Component Value Date   HGBA1C 5.5 04/24/2022   MPG 111 04/24/2022   No results found for: "PROLACTIN" Lab Results  Component Value Date   CHOL 220 (H) 04/24/2022   TRIG 700 (H) 04/24/2022   HDL 35 (L) 04/24/2022   CHOLHDL 6.3 04/24/2022   VLDL UNABLE TO CALCULATE IF TRIGLYCERIDE OVER 400 mg/dL 04/24/2022   LDLCALC UNABLE TO CALCULATE IF TRIGLYCERIDE OVER 400 mg/dL 04/24/2022    Physical Findings: AIMS: Facial and Oral Movements Muscles of Facial Expression: None, normal Lips and Perioral Area: None, normal Jaw: None, normal Tongue: None, normal,Extremity Movements Upper (arms, wrists, hands, fingers): None, normal Lower (legs, knees, ankles, toes): None, normal, Trunk Movements Neck, shoulders, hips: None, normal, Overall Severity Severity of abnormal movements (highest score from questions above): None, normal Incapacitation due to abnormal movements: None, normal Patient's awareness of abnormal movements (rate only patient's report): No Awareness, Dental Status Current problems with teeth and/or dentures?: No Does patient usually wear dentures?: No  CIWA:    COWS:     Musculoskeletal: Strength & Muscle Tone: within normal limits Gait & Station: normal Patient leans: N/A  Psychiatric Specialty Exam:  Presentation  General Appearance:  Disheveled  Eye Contact: Minimal  Speech: Pressured  Speech Volume: Normal  Handedness: Right   Mood and Affect  Mood: Euphoric; Irritable  Affect: Inappropriate   Thought Process  Thought Processes: Disorganized  Descriptions of Associations:Loose  Orientation:Partial  Thought Content:Illogical; Scattered  History of Schizophrenia/Schizoaffective disorder:No data recorded Duration of Psychotic Symptoms:No data recorded Hallucinations:No data recorded Ideas of Reference:Delusions;  Paranoia  Suicidal Thoughts:No data recorded Homicidal Thoughts:No data recorded  Sensorium  Memory: Immediate Poor; Recent Poor; Remote Poor  Judgment: Poor  Insight: Poor   Executive Functions  Concentration: Fair  Attention Span: Fair  Recall: Poor  Fund of Knowledge: Poor  Language: Poor   Psychomotor Activity  Psychomotor Activity:No data recorded  Assets  Assets: Desire for Improvement; Social Support   Sleep  Sleep:No data recorded   Physical Exam: Physical Exam Vitals and nursing note reviewed.  Constitutional:      Appearance: Normal appearance.  HENT:     Head: Normocephalic and atraumatic.     Mouth/Throat:     Pharynx: Oropharynx is clear.  Eyes:     Pupils: Pupils are equal, round, and reactive to light.  Cardiovascular:  Rate and Rhythm: Normal rate and regular rhythm.  Pulmonary:     Effort: Pulmonary effort is normal.     Breath sounds: Normal breath sounds.  Abdominal:     General: Abdomen is flat.     Palpations: Abdomen is soft.  Musculoskeletal:        General: Normal range of motion.  Skin:    General: Skin is warm and dry.  Neurological:     General: No focal deficit present.     Mental Status: He is alert. Mental status is at baseline.  Psychiatric:        Attention and Perception: Attention normal.        Mood and Affect: Mood normal. Affect is blunt.        Speech: Speech is delayed.        Behavior: Behavior is slowed.        Thought Content: Thought content normal.    Review of Systems  Constitutional: Negative.   HENT: Negative.    Eyes: Negative.   Respiratory: Negative.    Cardiovascular: Negative.   Gastrointestinal:  Positive for constipation.  Musculoskeletal: Negative.   Skin: Negative.   Neurological: Negative.   Psychiatric/Behavioral: Negative.     Blood pressure 132/87, pulse 96, temperature 97.7 F (36.5 C), temperature source Oral, resp. rate 18, height 5' 9"$  (1.753 m), weight 96.4  kg, SpO2 98 %. Body mass index is 31.38 kg/m.   Treatment Plan Summary: Plan no change to medication management except to encourage him to use the magnesium citrate if needed for constipation.  Still focusing on discharge planning  Alethia Berthold, MD 06/08/2022, 12:49 PM

## 2022-06-08 NOTE — Progress Notes (Signed)
Patient alert and oriented x 4, affect is flat thoughts are organized and coherent. Patient denies SI/HI/AVH, he isolates to self in his room not interacting with peers and  forwards very little to staff. Patient is complaint with medication regimen.15 minutes safety checks maintained will continue to monitor.

## 2022-06-08 NOTE — Group Note (Signed)
LCSW Group Therapy Note   Group Date: 06/08/2022 Start Time: 1200 End Time: 1245   Type of Therapy/Topic: Identifying Irrational Beliefs/Thoughts  Participation Level: Did not attend  Description of Group: The purpose of this group is to assist patients in learning to identify irrational beliefs and thoughts that contribute to their negative emotions and experience positive emotions. Patients will be guided to discuss ways in which they have been effected by irrational thoughts and beliefs and how to transform those irrational beliefs into rational ones. Newly identified rational beliefs will be juxtaposed with experiences of positive emotions or situations, and patients will be challenged to use rational beliefs or thoughts to combat negative ones. Special emphasis will be placed on coping with irrational beliefs in conflict situations, and patients will process healthy conflict resolution skills.  Therapeutic Goals: 1. Patient will identify two irrational thoughts or beliefs  to reflect on in order to balance out those thoughts 2. Patient will label two or more irrational thoughts/beliefs that they find the most difficult to cope with 3. Patient will demonstrate positive conflict resolution skills through discussion and/or role plays that will assist in transforming irrational thoughts or beliefs into positive ones.  Summary of Patient Progress: Did not attend. Packet left with pt.    Therapeutic Modalities: Cognitive Behavioral Therapy Feelings Identification Dialectical Behavioral Therapy   Darletta Moll MSW, LCSW Clincal Social Worker

## 2022-06-08 NOTE — BHH Group Notes (Signed)
[place] Group Notes:  (Nursing/MHT/Case Management/Adjunct)  Date:  06/08/2022  Time:  10:05 AM  Type of Therapy:   community meeting  Participation Level:  Did Not Attend    Antonieta Pert 06/08/2022, 10:05 AM

## 2022-06-08 NOTE — Plan of Care (Signed)
D- Patient alert and oriented. Patient presents in a pleasant mood on assessment stating reporting that he slept good last night and had continued complaints of constipation, in which he requested PRN laxative to help with relief. Patient denied SI, HI, AVH, and pain at this time. Patient also denied any signs/symptoms of depression and anxiety. Patient had no stated goals for today.  A- Scheduled medications administered to patient, per MD orders. Support and encouragement provided.  Routine safety checks conducted every 15 minutes.  Patient informed to notify staff with problems or concerns.  R- No adverse drug reactions noted. Patient contracts for safety at this time. Patient compliant with medications. Patient receptive, calm, and cooperative. Patient isolates to room, except for meals and medication. Patient remains safe at this time.  Problem: Education: Goal: Knowledge of General Education information will improve Description: Including pain rating scale, medication(s)/side effects and non-pharmacologic comfort measures Outcome: Progressing   Problem: Health Behavior/Discharge Planning: Goal: Ability to manage health-related needs will improve Outcome: Progressing   Problem: Clinical Measurements: Goal: Ability to maintain clinical measurements within normal limits will improve Outcome: Progressing Goal: Will remain free from infection Outcome: Progressing Goal: Diagnostic test results will improve Outcome: Progressing Goal: Respiratory complications will improve Outcome: Progressing Goal: Cardiovascular complication will be avoided Outcome: Progressing   Problem: Activity: Goal: Risk for activity intolerance will decrease Outcome: Progressing   Problem: Nutrition: Goal: Adequate nutrition will be maintained Outcome: Progressing   Problem: Coping: Goal: Level of anxiety will decrease Outcome: Progressing   Problem: Elimination: Goal: Will not experience complications  related to urinary retention Outcome: Progressing   Problem: Pain Managment: Goal: General experience of comfort will improve Outcome: Progressing   Problem: Safety: Goal: Ability to remain free from injury will improve Outcome: Progressing   Problem: Skin Integrity: Goal: Risk for impaired skin integrity will decrease Outcome: Progressing   Problem: Education: Goal: Knowledge of [place] [facility] Education information/materials will improve Outcome: Progressing Goal: Emotional status will improve Outcome: Progressing Goal: Mental status will improve Outcome: Progressing Goal: Verbalization of understanding the information provided will improve Outcome: Progressing   Problem: Activity: Goal: Interest or engagement in activities will improve Outcome: Progressing Goal: Sleeping patterns will improve Outcome: Progressing   Problem: Coping: Goal: Ability to verbalize frustrations and anger appropriately will improve Outcome: Progressing Goal: Ability to demonstrate self-control will improve Outcome: Progressing   Problem: Health Behavior/Discharge Planning: Goal: Identification of resources available to assist in meeting health care needs will improve Outcome: Progressing Goal: Compliance with treatment plan for underlying cause of condition will improve Outcome: Progressing   Problem: Physical Regulation: Goal: Ability to maintain clinical measurements within normal limits will improve Outcome: Progressing   Problem: Safety: Goal: Periods of time without injury will increase Outcome: Progressing   Problem: Activity: Goal: Will verbalize the importance of balancing activity with adequate rest periods Outcome: Progressing   Problem: Education: Goal: Will be free of psychotic symptoms Outcome: Progressing Goal: Knowledge of the prescribed therapeutic regimen will improve Outcome: Progressing   Problem: Coping: Goal: Coping ability will improve Outcome:  Progressing Goal: Will verbalize feelings Outcome: Progressing   Problem: Health Behavior/Discharge Planning: Goal: Compliance with prescribed medication regimen will improve Outcome: Progressing   Problem: Nutritional: Goal: Ability to achieve adequate nutritional intake will improve Outcome: Progressing   Problem: Role Relationship: Goal: Ability to communicate needs accurately will improve Outcome: Progressing Goal: Ability to interact with others will improve Outcome: Progressing   Problem: Safety: Goal: Ability to redirect hostility and  anger into socially appropriate behaviors will improve Outcome: Progressing Goal: Ability to remain free from injury will improve Outcome: Progressing   Problem: Self-Care: Goal: Ability to participate in self-care as condition permits will improve Outcome: Progressing   Problem: Self-Concept: Goal: Will verbalize positive feelings about self Outcome: Progressing

## 2022-06-09 DIAGNOSIS — F203 Undifferentiated schizophrenia: Secondary | ICD-10-CM | POA: Diagnosis not present

## 2022-06-09 NOTE — [facility] Group Notes (Signed)
[facility] Group Notes:  (Nursing/MHT/Case Management/Adjunct)  Date:  06/09/2022  Time:  11:23 AM  Type of Therapy:   community meeting  Participation Level:  Did Not Attend    Antonieta Pert 06/09/2022, 11:23 AM

## 2022-06-09 NOTE — Progress Notes (Signed)
[facility] MD Progress Note  06/09/2022 1:25 PM Travis Sandoval  MRN:  [id number] Subjective: Follow-up 42 year old man with schizophrenia.  No new complaints.  He actually had a bowel movement from the medicine and is no longer constipated.  No agitation no behavior problems Principal Problem: Schizophrenia ([place]) Diagnosis: Principal Problem:   Schizophrenia ([place]) Active Problems:   Psychosis ([place])  Total Time spent with patient: 20 minutes  Past Psychiatric History: Past history of schizophrenia  Past Medical History:  Past Medical History:  Diagnosis Date   ADHD    Bipolar 1 disorder ([place])    Hepatitis C    Schizoaffective disorder ([place])    History reviewed. No pertinent surgical history. Family History:  Family History  Family history unknown: Yes   Family Psychiatric  History: See previous Social History:  Social History   Substance and Sexual Activity  Alcohol Use Not Currently     Social History   Substance and Sexual Activity  Drug Use Not Currently    Social History   Socioeconomic History   Marital status: Unknown    Spouse name: Not on file   Number of children: Not on file   Years of education: Not on file   Highest education level: Not on file  Occupational History   Not on file  Tobacco Use   Smoking status: Every Day    Packs/day: 0.25    Years: 15.00    Total pack years: 3.75    Types: Cigarettes   Smokeless tobacco: Not on file  Vaping Use   Vaping Use: Not on file  Substance and Sexual Activity   Alcohol use: Not Currently   Drug use: Not Currently   Sexual activity: Not Currently  Other Topics Concern   Not on file  Social History Narrative   Not on file   Social Determinants of Health   Financial Resource Strain: Not on file  Food Insecurity: Unknown (05/18/2022)   Hunger Vital Sign    Worried About Running Out of Food in the Last Year: Patient refused    [place] in the Last Year: Patient refused  Transportation Needs:  Unknown (05/18/2022)   [place] - Hydrologist (Medical): Patient refused    Lack of Transportation (Non-Medical): Patient refused  Physical Activity: Not on file  Stress: Not on file  Social Connections: Not on file   Additional Social History:                         Sleep: Fair  Appetite:  Fair  Current Medications: Current Facility-Administered Medications  Medication Dose Route Frequency Provider Last Rate Last Admin   acetaminophen (TYLENOL) tablet 650 mg  650 mg Oral Q6H PRN [name], NP       alum & mag hydroxide-simeth (MAALOX/MYLANTA) 200-200-20 MG/5ML suspension 30 mL  30 mL Oral Q4H PRN [name], NP   30 mL at 05/22/22 0816   cloZAPine (CLOZARIL) tablet 250 mg  250 mg Oral QHS Laquan Beier T, MD   250 mg at 06/08/22 2139   docusate sodium (COLACE) capsule 100 mg  100 mg Oral BID Ahmiya Abee T, MD   100 mg at 06/09/22 0903   famotidine (PEPCID) tablet 20 mg  20 mg Oral BID Darry Kelnhofer, [name], MD   20 mg at 06/09/22 0902   hydrOXYzine (ATARAX) tablet 50 mg  50 mg Oral Q6H PRN Kaislyn Gulas, [name], MD  50 mg at 05/17/22 0944   magnesium citrate solution 1 Bottle  1 Bottle Oral Daily PRN Starlena Beil, [name], MD   1 Bottle at 06/08/22 1637   magnesium hydroxide (MILK OF MAGNESIA) suspension 30 mL  30 mL Oral Daily PRN [name], NP   30 mL at 06/08/22 0755   nicotine polacrilex (NICORETTE) gum 2 mg  2 mg Oral PRN Akina Maish T, MD   2 mg at 04/23/22 2200   paliperidone (INVEGA SUSTENNA) injection 156 mg  156 mg Intramuscular Q28 days Kaysey Berndt T, MD   156 mg at 05/30/22 1100   senna-docusate (Senokot-S) tablet 2 tablet  2 tablet Oral Daily PRN Vermelle Cammarata, [name], MD   2 tablet at 06/07/22 2118   ziprasidone (GEODON) injection 20 mg  20 mg Intramuscular Q12H PRN [name], MD        Lab Results: No results found for this or any previous visit (from the past 48 hour(s)).  Blood Alcohol level:  Lab Results   Component Value Date   ETH <10 0000000    Metabolic Disorder Labs: Lab Results  Component Value Date   HGBA1C 5.5 04/24/2022   MPG 111 04/24/2022   No results found for: "PROLACTIN" Lab Results  Component Value Date   CHOL 220 (H) 04/24/2022   TRIG 700 (H) 04/24/2022   HDL 35 (L) 04/24/2022   CHOLHDL 6.3 04/24/2022   VLDL UNABLE TO CALCULATE IF TRIGLYCERIDE OVER 400 mg/dL 04/24/2022   LDLCALC UNABLE TO CALCULATE IF TRIGLYCERIDE OVER 400 mg/dL 04/24/2022    Physical Findings: AIMS: Facial and Oral Movements Muscles of Facial Expression: None, normal Lips and Perioral Area: None, normal Jaw: None, normal Tongue: None, normal,Extremity Movements Upper (arms, wrists, hands, fingers): None, normal Lower (legs, knees, ankles, toes): None, normal, Trunk Movements Neck, shoulders, hips: None, normal, Overall Severity Severity of abnormal movements (highest score from questions above): None, normal Incapacitation due to abnormal movements: None, normal Patient's awareness of abnormal movements (rate only patient's report): No Awareness, Dental Status Current problems with teeth and/or dentures?: No Does patient usually wear dentures?: No  CIWA:    COWS:     Musculoskeletal: Strength & Muscle Tone: within normal limits Gait & Station: normal Patient leans: N/A  Psychiatric Specialty Exam:  Presentation  General Appearance:  Disheveled  Eye Contact: Minimal  Speech: Pressured  Speech Volume: Normal  Handedness: Right   Mood and Affect  Mood: Euphoric; Irritable  Affect: Inappropriate   Thought Process  Thought Processes: Disorganized  Descriptions of Associations:Loose  Orientation:Partial  Thought Content:Illogical; Scattered  History of Schizophrenia/Schizoaffective disorder:No data recorded Duration of Psychotic Symptoms:No data recorded Hallucinations:No data recorded Ideas of Reference:Delusions; Paranoia  Suicidal Thoughts:No data  recorded Homicidal Thoughts:No data recorded  Sensorium  Memory: Immediate Poor; Recent Poor; Remote Poor  Judgment: Poor  Insight: Poor   Executive Functions  Concentration: Fair  Attention Span: Fair  Recall: Poor  Fund of Knowledge: Poor  Language: Poor   Psychomotor Activity  Psychomotor Activity:No data recorded  Assets  Assets: Desire for Improvement; Social Support   Sleep  Sleep:No data recorded   Physical Exam: Physical Exam Vitals and nursing note reviewed.  Constitutional:      Appearance: Normal appearance.  HENT:     Head: Normocephalic and atraumatic.     Mouth/Throat:     Pharynx: Oropharynx is clear.  Eyes:     Pupils: Pupils are equal, round, and reactive to light.  Cardiovascular:  Rate and Rhythm: Normal rate and regular rhythm.  Pulmonary:     Effort: Pulmonary effort is normal.     Breath sounds: Normal breath sounds.  Abdominal:     General: Abdomen is flat.     Palpations: Abdomen is soft.  Musculoskeletal:        General: Normal range of motion.  Skin:    General: Skin is warm and dry.  Neurological:     General: No focal deficit present.     Mental Status: He is alert. Mental status is at baseline.  Psychiatric:        Attention and Perception: Attention normal.        Mood and Affect: Mood normal. Affect is blunt.        Speech: Speech normal.        Behavior: Behavior normal.        Thought Content: Thought content normal.    Review of Systems  Constitutional: Negative.   HENT: Negative.    Eyes: Negative.   Respiratory: Negative.    Cardiovascular: Negative.   Gastrointestinal: Negative.   Musculoskeletal: Negative.   Skin: Negative.   Neurological: Negative.   Psychiatric/Behavioral: Negative.     Blood pressure 120/83, pulse (!) 102, temperature 97.9 F (36.6 C), temperature source Oral, resp. rate 16, height 5' 9"$  (1.753 m), weight 96.4 kg, SpO2 97 %. Body mass index is 31.38  kg/m.   Treatment Plan Summary: Medication management and Plan no change to medication treatment.  No change in overall plan.  We are working on some kind of disposition when finances can be arranged.  Alethia Berthold, MD 06/09/2022, 1:25 PM

## 2022-06-09 NOTE — Progress Notes (Signed)
Patient alert and oriented x 4 affect is flat and speech is coherent. No distress noted with patient, he is complaint with medication regimen, still complains of constipation given laxative per PRN status. Patient ABD appear rounded, hypoactive bowel sounds, endorses flatus. 15 minutes safety checks maintained.

## 2022-06-09 NOTE — Progress Notes (Signed)
D- Patient alert and oriented x 3-4 . Affect flat/mood depressed. Denies SI/ HI/AVH. He denies pain. He had complained of constipation but states he had a very large BM this morning. Will continue routine Colace as preventative measure. A- Scheduled medications administered to patient, per MD orders. Support and encouragement provided.  Routine safety checks conducted every 15 minutes without incident. Patient informed to notify staff with problems or concerns. R- No adverse drug reactions noted. He requested PRN Magnesium Citrate again because he is "still full; wanna clean out". Patient compliant with medications and treatment plan. Patient receptive, calm, and cooperative. Patient stayed in his room for the majority of the shift except for meals. Patient contracts for safety and remains safe on the unit at this time.

## 2022-06-09 NOTE — [facility] Group Notes (Signed)
Date:  06/09/2022 Time:  10:54 PM  Group Topic/Focus:  Healthy Communication:   The focus of this group is to discuss communication, barriers to communication, as well as healthy ways to communicate with others.    Participation Level:  Did Not Attend  Participation Quality:   None  Affect:   None  Cognitive:   None  Insight: None  Engagement in Group:   None  Modes of Intervention:  Activity and Discussion  Additional Comments:  " What's Your Ideal Support System? "  Travis Sandoval 06/09/2022, 10:54 PM

## 2022-06-10 DIAGNOSIS — F203 Undifferentiated schizophrenia: Secondary | ICD-10-CM | POA: Diagnosis not present

## 2022-06-10 NOTE — Group Note (Signed)
Recreation Therapy Group Note   Group Topic:Coping Skills  Group Date: 06/10/2022 Start Time: 1000 End Time: 1045 Facilitators: Vilma Prader, LRT, CTRS Location:  Craftroom  Group description: Now Future Wall. Patients were given a sheet of paper and asked to fold it into 3 sections, like a pamphlet. Top section, patients were encouraged to write what they are feeling or experiencing "now". The bottom section, patients were asked to fill out how they want to feel or things they want to experience in the "future".  In the middle section, patients were encouraged to fill out any "walls" or barriers that are getting in the way of them reaching their "future". On the back of the sheet, patients were encouraged to write positive coping skills that will help them get over or though the walls they experience. LRT and patients discussed each of the sections and shared them aloud in group. Patients are encouraged to keep this paper with them as a guide/plan post discharge.   Affect/Mood: N/A   Participation Level: Did not attend    Clinical Observations/Individualized Feedback: [place] did not attend group due to resting in their room.   Plan: Continue to engage patient in RT group sessions 2-3x/week.   Vilma Prader, LRT, CTRS 06/10/2022 11:35 AM

## 2022-06-10 NOTE — [facility] Group Notes (Signed)
[place] Group Notes:  (Nursing/MHT/Case Management/Adjunct)  Date:  06/10/2022  Time:  9:47 AM  Type of Therapy:   Community Meeting  Participation Level:  Did Not Attend  Participation Quality:    Affect:    Cognitive:    Insight:    Engagement in Group:    Modes of Intervention:    Summary of Progress/Problems:  Hosie Spangle 06/10/2022, 9:47 AM

## 2022-06-10 NOTE — Progress Notes (Signed)
Patient alert and oriented x 4, affect is flat thoughts are organized and coherent. Patient denies SI/HI/AVH, he isolates to self in his room not interacting with peers and  forwards very little to staff. Patient is complaint with medication regimen.15 minutes safety checks maintained will continue to monitor.

## 2022-06-10 NOTE — Plan of Care (Signed)
Patient isolates to his room. No issues verbalized. Denies SI,HI and AVH. Appetite and energy level good. Encouraged for shower. Support and encouragement given.

## 2022-06-10 NOTE — Progress Notes (Signed)
[facility] MD Progress Note  06/10/2022 2:44 PM Travis Sandoval  MRN:  [id number] Subjective: Follow-up 42 year old with schizophrenia.  Continues to stay mostly isolated but when he is approached he converses normally.  No new complaints. Principal Problem: Schizophrenia ([place]) Diagnosis: Principal Problem:   Schizophrenia ([place]) Active Problems:   Psychosis ([place])  Total Time spent with patient: 30 minutes  Past Psychiatric History: Past history of schizophrenia  Past Medical History:  Past Medical History:  Diagnosis Date   ADHD    Bipolar 1 disorder ([place])    Hepatitis C    Schizoaffective disorder ([place])    History reviewed. No pertinent surgical history. Family History:  Family History  Family history unknown: Yes   Family Psychiatric  History: See previous Social History:  Social History   Substance and Sexual Activity  Alcohol Use Not Currently     Social History   Substance and Sexual Activity  Drug Use Not Currently    Social History   Socioeconomic History   Marital status: Unknown    Spouse name: Not on file   Number of children: Not on file   Years of education: Not on file   Highest education level: Not on file  Occupational History   Not on file  Tobacco Use   Smoking status: Every Day    Packs/day: 0.25    Years: 15.00    Total pack years: 3.75    Types: Cigarettes   Smokeless tobacco: Not on file  Vaping Use   Vaping Use: Not on file  Substance and Sexual Activity   Alcohol use: Not Currently   Drug use: Not Currently   Sexual activity: Not Currently  Other Topics Concern   Not on file  Social History Narrative   Not on file   Social Determinants of Health   Financial Resource Strain: Not on file  Food Insecurity: Unknown (05/18/2022)   Hunger Vital Sign    Worried About Running Out of Food in the Last Year: Patient refused    [place] in the Last Year: Patient refused  Transportation Needs: Unknown (05/18/2022)   [place] -  Hydrologist (Medical): Patient refused    Lack of Transportation (Non-Medical): Patient refused  Physical Activity: Not on file  Stress: Not on file  Social Connections: Not on file   Additional Social History:                         Sleep: Fair  Appetite:  Fair  Current Medications: Current Facility-Administered Medications  Medication Dose Route Frequency Provider Last Rate Last Admin   acetaminophen (TYLENOL) tablet 650 mg  650 mg Oral Q6H PRN [name], NP       alum & mag hydroxide-simeth (MAALOX/MYLANTA) 200-200-20 MG/5ML suspension 30 mL  30 mL Oral Q4H PRN [name], NP   30 mL at 05/22/22 0816   cloZAPine (CLOZARIL) tablet 250 mg  250 mg Oral QHS Emaya Preston T, MD   250 mg at 06/09/22 2129   docusate sodium (COLACE) capsule 100 mg  100 mg Oral BID Obaloluwa Delatte T, MD   100 mg at 06/10/22 0854   famotidine (PEPCID) tablet 20 mg  20 mg Oral BID Erma Raiche T, MD   20 mg at 06/10/22 0854   hydrOXYzine (ATARAX) tablet 50 mg  50 mg Oral Q6H PRN Prinston Kynard, [name], MD   50 mg at 05/17/22 [phone]  magnesium citrate solution 1 Bottle  1 Bottle Oral Daily PRN Asia Favata T, MD   1 Bottle at 06/09/22 1719   magnesium hydroxide (MILK OF MAGNESIA) suspension 30 mL  30 mL Oral Daily PRN [name], NP   30 mL at 06/08/22 0755   nicotine polacrilex (NICORETTE) gum 2 mg  2 mg Oral PRN Odena Mcquaid T, MD   2 mg at 04/23/22 2200   paliperidone (INVEGA SUSTENNA) injection 156 mg  156 mg Intramuscular Q28 days Pansey Pinheiro T, MD   156 mg at 05/30/22 1100   senna-docusate (Senokot-S) tablet 2 tablet  2 tablet Oral Daily PRN Shia Delaine, [name], MD   2 tablet at 06/07/22 2118   ziprasidone (GEODON) injection 20 mg  20 mg Intramuscular Q12H PRN [name], MD        Lab Results: No results found for this or any previous visit (from the past 48 hour(s)).  Blood Alcohol level:  Lab Results  Component Value Date   ETH <10  0000000    Metabolic Disorder Labs: Lab Results  Component Value Date   HGBA1C 5.5 04/24/2022   MPG 111 04/24/2022   No results found for: "PROLACTIN" Lab Results  Component Value Date   CHOL 220 (H) 04/24/2022   TRIG 700 (H) 04/24/2022   HDL 35 (L) 04/24/2022   CHOLHDL 6.3 04/24/2022   VLDL UNABLE TO CALCULATE IF TRIGLYCERIDE OVER 400 mg/dL 04/24/2022   LDLCALC UNABLE TO CALCULATE IF TRIGLYCERIDE OVER 400 mg/dL 04/24/2022    Physical Findings: AIMS: Facial and Oral Movements Muscles of Facial Expression: None, normal Lips and Perioral Area: None, normal Jaw: None, normal Tongue: None, normal,Extremity Movements Upper (arms, wrists, hands, fingers): None, normal Lower (legs, knees, ankles, toes): None, normal, Trunk Movements Neck, shoulders, hips: None, normal, Overall Severity Severity of abnormal movements (highest score from questions above): None, normal Incapacitation due to abnormal movements: None, normal Patient's awareness of abnormal movements (rate only patient's report): No Awareness, Dental Status Current problems with teeth and/or dentures?: No Does patient usually wear dentures?: No  CIWA:    COWS:     Musculoskeletal: Strength & Muscle Tone: within normal limits Gait & Station: normal Patient leans: N/A  Psychiatric Specialty Exam:  Presentation  General Appearance:  Disheveled  Eye Contact: Minimal  Speech: Pressured  Speech Volume: Normal  Handedness: Right   Mood and Affect  Mood: Euphoric; Irritable  Affect: Inappropriate   Thought Process  Thought Processes: Disorganized  Descriptions of Associations:Loose  Orientation:Partial  Thought Content:Illogical; Scattered  History of Schizophrenia/Schizoaffective disorder:No data recorded Duration of Psychotic Symptoms:No data recorded Hallucinations:No data recorded Ideas of Reference:Delusions; Paranoia  Suicidal Thoughts:No data recorded Homicidal Thoughts:No  data recorded  Sensorium  Memory: Immediate Poor; Recent Poor; Remote Poor  Judgment: Poor  Insight: Poor   Executive Functions  Concentration: Fair  Attention Span: Fair  Recall: Poor  Fund of Knowledge: Poor  Language: Poor   Psychomotor Activity  Psychomotor Activity:No data recorded  Assets  Assets: Desire for Improvement; Social Support   Sleep  Sleep:No data recorded   Physical Exam: Physical Exam Vitals and nursing note reviewed.  Constitutional:      Appearance: Normal appearance.  HENT:     Head: Normocephalic and atraumatic.     Mouth/Throat:     Pharynx: Oropharynx is clear.  Eyes:     Pupils: Pupils are equal, round, and reactive to light.  Cardiovascular:     Rate and Rhythm: Normal rate and  regular rhythm.  Pulmonary:     Effort: Pulmonary effort is normal.     Breath sounds: Normal breath sounds.  Abdominal:     General: Abdomen is flat.     Palpations: Abdomen is soft.  Musculoskeletal:        General: Normal range of motion.  Skin:    General: Skin is warm and dry.  Neurological:     General: No focal deficit present.     Mental Status: He is alert. Mental status is at baseline.  Psychiatric:        Mood and Affect: Mood normal.        Thought Content: Thought content normal.    Review of Systems  Constitutional: Negative.   HENT: Negative.    Eyes: Negative.   Respiratory: Negative.    Cardiovascular: Negative.   Gastrointestinal: Negative.   Musculoskeletal: Negative.   Skin: Negative.   Neurological: Negative.   Psychiatric/Behavioral: Negative.     Blood pressure 118/86, pulse (!) 104, temperature (!) 97.5 F (36.4 C), temperature source Oral, resp. rate 16, height 5' 9"$  (1.753 m), weight 96.4 kg, SpO2 97 %. Body mass index is 31.38 kg/m.   Treatment Plan Summary: Medication management and Plan absolutely stable.  No change in behavior no new problems.  Continue to focus on discharge planning  Alethia Berthold, MD 06/10/2022, 2:44 PM

## 2022-06-10 NOTE — Group Note (Signed)
[facility] LCSW Group Therapy Note    Group Date: 06/10/2022 Start Time: [id number] End Time: 1400  Type of Therapy and Topic:  Group Therapy:  Overcoming Obstacles  Participation Level:  [facility] PARTICIPATION LEVEL: Did Not Attend  Mood:  Description of Group:   In this group patients will be encouraged to explore what they see as obstacles to their own wellness and recovery. They will be guided to discuss their thoughts, feelings, and behaviors related to these obstacles. The group will process together ways to cope with barriers, with attention given to specific choices patients can make. Each patient will be challenged to identify changes they are motivated to make in order to overcome their obstacles. This group will be process-oriented, with patients participating in exploration of their own experiences as well as giving and receiving support and challenge from other group members.  Therapeutic Goals: 1. Patient will identify personal and current obstacles as they relate to admission. 2. Patient will identify barriers that currently interfere with their wellness or overcoming obstacles.  3. Patient will identify feelings, thought process and behaviors related to these barriers. 4. Patient will identify two changes they are willing to make to overcome these obstacles:    Summary of Patient Progress   X   Therapeutic Modalities:   Cognitive Behavioral Therapy Solution Focused Therapy Motivational Interviewing Relapse Prevention Therapy   Rozann Lesches, LCSW

## 2022-06-11 DIAGNOSIS — F203 Undifferentiated schizophrenia: Secondary | ICD-10-CM | POA: Diagnosis not present

## 2022-06-11 NOTE — BH IP Treatment Plan (Signed)
Interdisciplinary Treatment and Diagnostic Plan Update  06/11/2022 Time of Session: 08:30 Travis Sandoval MRN: [id number]  Principal Diagnosis: Schizophrenia [facility])  Secondary Diagnoses: Principal Problem:   Schizophrenia ([place]) Active Problems:   Psychosis ([place])   Current Medications:  Current Facility-Administered Medications  Medication Dose Route Frequency Provider Last Rate Last Admin   acetaminophen (TYLENOL) tablet 650 mg  650 mg Oral Q6H PRN [name], NP       alum & mag hydroxide-simeth (MAALOX/MYLANTA) 200-200-20 MG/5ML suspension 30 mL  30 mL Oral Q4H PRN [name], NP   30 mL at 05/22/22 0816   cloZAPine (CLOZARIL) tablet 250 mg  250 mg Oral QHS [name], [name], MD   250 mg at 06/10/22 2112   docusate sodium (COLACE) capsule 100 mg  100 mg Oral BID [name], [name], MD   100 mg at 06/11/22 0759   famotidine (PEPCID) tablet 20 mg  20 mg Oral BID [name], [name], MD   20 mg at 06/11/22 0759   hydrOXYzine (ATARAX) tablet 50 mg  50 mg Oral Q6H PRN [name], [name], MD   50 mg at 05/17/22 0944   magnesium citrate solution 1 Bottle  1 Bottle Oral Daily PRN [name], [name], MD   1 Bottle at 06/09/22 1719   magnesium hydroxide (MILK OF MAGNESIA) suspension 30 mL  30 mL Oral Daily PRN [name], NP   30 mL at 06/08/22 0755   nicotine polacrilex (NICORETTE) gum 2 mg  2 mg Oral PRN [name], [name], MD   2 mg at 04/23/22 2200   paliperidone (INVEGA SUSTENNA) injection 156 mg  156 mg Intramuscular Q28 days [name], [name], MD   156 mg at 05/30/22 1100   senna-docusate (Senokot-S) tablet 2 tablet  2 tablet Oral Daily PRN [name], [name], MD   2 tablet at 06/07/22 2118   ziprasidone (GEODON) injection 20 mg  20 mg Intramuscular Q12H PRN [name], MD       PTA Medications: Medications Prior to Admission  Medication Sig Dispense Refill Last Dose   ABILIFY MAINTENA 400 MG SRER injection Inject 400 mg into the muscle every 28 (twenty-eight) days.      ARIPiprazole  (ABILIFY) 10 MG tablet Take 10 mg by mouth daily. (Patient not taking: Reported on 04/16/2022)      atomoxetine (STRATTERA) 40 MG capsule Take 40 mg by mouth daily. (Patient not taking: Reported on 04/16/2022)      atorvastatin (LIPITOR) 40 MG tablet Take 40 mg by mouth daily.      budesonide-formoterol (SYMBICORT) 160-4.5 MCG/ACT inhaler Inhale 2 puffs into the lungs.      cetirizine (ZYRTEC) 10 MG tablet Take 10 mg by mouth daily.      clozapine (CLOZARIL) 200 MG tablet Take 200 mg by mouth 2 (two) times daily. Take along with one 25 mg tablet for total 225 mg twice daily      cloZAPine (CLOZARIL) 25 MG tablet Take 25 mg by mouth 2 (two) times daily. Take along with one 200 mg tablet for total 225 mg twice daily      Dexlansoprazole (DEXILANT) 30 MG capsule Take 30 mg by mouth daily. (Patient not taking: Reported on 04/16/2022)      hydrOXYzine (VISTARIL) 50 MG capsule Take 50 mg by mouth 3 (three) times daily as needed for itching. (Patient not taking: Reported on 04/16/2022)      Melatonin 5 MG TABS Take 5 mg by mouth at bedtime as  needed (sleep).      metoprolol succinate (TOPROL-XL) 25 MG 24 hr tablet Take 12.5 mg by mouth daily.      Phosphatidyl Choline 65 MG TABS Take 1 tablet by mouth daily.  (Patient not taking: Reported on 04/16/2022)      valproic acid (DEPAKENE) 250 MG/5ML solution Take 750-1,000 mLs by mouth 2 (two) times daily. 1000 mg (20 mL) every morning and 750 mg (15 mL) daily at bedtime       Patient Stressors: Other: Psychosis    Patient Strengths: Ability for insight  Active sense of humor  Average or above average intelligence  Capable of independent living  Supportive family/friends   Treatment Modalities: Medication Management, Group therapy, Case management,  1 to 1 session with clinician, Psychoeducation, Recreational therapy.   Physician Treatment Plan for Primary Diagnosis: Schizophrenia ([place]) Long Term Goal(s):     Short Term Goals: None, pt is a poor  hx.  Medication Management: Evaluate patient's response, side effects, and tolerance of medication regimen.  Therapeutic Interventions: 1 to 1 sessions, Unit Group sessions and Medication administration.  Evaluation of Outcomes: Progressing  Physician Treatment Plan for Secondary Diagnosis: Principal Problem:   Schizophrenia ([place]) Active Problems:   Psychosis ([place])  Long Term Goal(s):     Short Term Goals: None, pt is a poor hx.     Medication Management: Evaluate patient's response, side effects, and tolerance of medication regimen.  Therapeutic Interventions: 1 to 1 sessions, Unit Group sessions and Medication administration.  Evaluation of Outcomes: Progressing   RN Treatment Plan for Primary Diagnosis: Schizophrenia ([place]) Long Term Goal(s): Knowledge of disease and therapeutic regimen to maintain health will improve  Short Term Goals: Ability to remain free from injury will improve, Ability to verbalize frustration and anger appropriately will improve, Ability to demonstrate self-control, Ability to participate in decision making will improve, Ability to verbalize feelings will improve, Ability to disclose and discuss suicidal ideas, Ability to identify and develop effective coping behaviors will improve, and Compliance with prescribed medications will improve  Medication Management: RN will administer medications as ordered by provider, will assess and evaluate patient's response and provide education to patient for prescribed medication. RN will report any adverse and/or side effects to prescribing provider.  Therapeutic Interventions: 1 on 1 counseling sessions, Psychoeducation, Medication administration, Evaluate responses to treatment, Monitor vital signs and CBGs as ordered, Perform/monitor CIWA, COWS, AIMS and Fall Risk screenings as ordered, Perform wound care treatments as ordered.  Evaluation of Outcomes: Progressing   LCSW Treatment Plan for Primary Diagnosis:  Schizophrenia ([place]) Long Term Goal(s): Safe transition to appropriate next level of care at discharge, Engage patient in therapeutic group addressing interpersonal concerns.  Short Term Goals: Engage patient in aftercare planning with referrals and resources, Increase social support, Increase ability to appropriately verbalize feelings, Increase emotional regulation, Facilitate acceptance of mental health diagnosis and concerns, and Increase skills for wellness and recovery  Therapeutic Interventions: Assess for all discharge needs, 1 to 1 time with Social worker, Explore available resources and support systems, Assess for adequacy in community support network, Educate family and significant other(s) on suicide prevention, Complete Psychosocial Assessment, Interpersonal group therapy.  Evaluation of Outcomes: Progressing   Progress in Treatment: Attending groups: No. Participating in groups: No. Taking medication as prescribed: Yes. Toleration medication: Yes. Family/Significant other contact made: Yes, individual(s) contacted:  father, Jamai Jespersen. Patient understands diagnosis: Yes. Discussing patient identified problems/goals with staff: Yes. Medical problems stabilized or resolved: Yes. Denies suicidal/homicidal ideation: Yes.  Issues/concerns per patient self-inventory: No. Other: none.  New problem(s) identified: No, Describe:  none Update 04/27/2022: No changes at this time.  Update 05/02/2022:   No changes at this time. Update 05/07/22: No changes at this time.  Update 05/12/22: No changes at this time. Update 05/17/2022: none Update 05/22/2022:  No changes at this time. Update 05/27/22: No changes at this time. Update 06/01/22: No changes at this time. Update 06/06/22: No changes at this time. Update 06/11/22: No changes at this time.    New Short Term/Long Term Goal(s): Patient to work towards elimination of symptoms of psychosis, medication management for mood stabilization; elimination of SI  thoughts; development of comprehensive mental wellness plan. Update 04/27/2022: No changes at this time.  Update 05/02/2022:   No changes at this time. Update 05/07/22: No changes at this time. Update 05/12/22: No changes at this time. Update 05/17/2022: Patient to work towards elimination of symptoms of psychosis, medication management for mood stabilization; development of comprehensive mental wellness plan.  Update 05/22/2022:  No changes at this time. Update 05/27/22: Patient to work towards detox, elimination of symptoms of psychosis, medication management for mood stabilization; elimination of SI thoughts; development of comprehensive mental wellness/sobriety plan. Update 06/01/22: No changes at this time. Update 06/06/22: No changes at this time. Update 06/11/22: No changes at this time.     Patient Goals:  No additional goals identified at this time. Patient to continue to work towards original goals identified in initial treatment team meeting. CSW will remain available to patient should they voice additional treatment goals. Update 04/27/2022: No changes at this time.  Update 05/02/2022:   No changes at this time. Update 05/07/22: No changes at this time. Update 05/12/22: No changes at this time.  Update 05/17/2022: No additional goals identified at this time. Patient to continue to work towards original goals identified in initial treatment team meeting. CSW will remain available to patient should they voice additional treatment goals.   Update 05/22/2022:  No changes at this time. Update 05/27/22: No additional goals identified at this time. Patient to continue to work towards original goals identified in initial treatment team meeting. CSW will remain available to patient should they voice additional treatment goals. Update 06/01/22: No changes at this time. Update 06/06/22: No changes at this time. Update 06/11/22: No changes at this time.   Discharge Plan or Barriers: No psychosocial barriers identified at this time,  patient to return to place of residence when appropriate for discharge. Update 04/27/2022: No changes at this time.  Update 05/02/2022:   Treatment team has discovered that the patient is not able to return to his group home and is in need of placement.  Meeting was held with treatment team and [employer] to assist patient in identifying appropriate housing. Update 05/07/22: No changes at this time. Update 05/12/22: No changes at this time.  Update 05/17/2022: Patient lacks adequate housing/supervision. Patient high risk of inadvertent harm to self due to chronic psychotic features. CSW to continue pursuing placement. Situation ongoing, CSW will continue to monitor and update note as more information becomes available.  Update 05/22/2022:  Travis Sandoval continues to work on identifying finanicial resources.  Placement continues to be sought. Update 05/27/22:  Patient lack adequate disposition/ supervision at [place]. CSW to provide case management and currently working with [employer] to mitigate funding barriers for placement at residential [facility] facility. Update 06/01/22: No changes at this time. Update 06/06/22: No changes at this time. Update 06/11/22: No changes at this time.  Reason for Continuation of Hospitalization: Medication stabilization Other; describe psychosis    Estimated Length of Stay: 1-7 days Update 04/27/2022: No changes at this time. Update 05/02/2022:   TBD Update 05/07/22: No changes at this time. Update 05/12/22: No changes at this time.  Update 05/17/2022: 1-7 days  Update 05/22/2022:  TBD Update 05/27/22: No changes at this time. Update 06/01/22: No changes at this time. Update 06/06/22: No changes at this time. Update 06/11/22: No changes at this time.  Last 3 [place] Suicide Severity Risk Score: Flowsheet Row Admission (Current) from 04/16/2022 in [place] ED from 04/15/2022 in [facility] Emergency Department at [place] No Risk No Risk       Last PHQ 2/9  Scores:     No data to display          Scribe for Treatment Team: Shirl Harris, LCSW 06/11/2022 9:11 AM

## 2022-06-11 NOTE — Plan of Care (Signed)
  Problem: Coping: Goal: Level of anxiety will decrease Outcome: Progressing   Problem: Elimination: Goal: Will not experience complications related to urinary retention Outcome: Progressing   Problem: Pain Managment: Goal: General experience of comfort will improve Outcome: Progressing Patient continues to isolate to his room denies SI/HI/A/VH and verbally contracted for safety. Compliant with medications and no adverse effects noted. Support and encouragement provided.

## 2022-06-11 NOTE — Plan of Care (Signed)
D: Patient alert and oriented. Patient denies pain. Patient denies anxiety and depression. Patient denies SI/HI/AVH. Patient remains isolative to room during shift with exception to coming out for meals and medication.  A: Scheduled medications administered to patient, per MD orders.  Support and encouragement provided to patient.  Q15 minute safety checks maintained.   R: Patient compliant with medication administration and treatment plan. No adverse drug reactions noted. Patient remains safe on the unit at this time. Problem: Education: Goal: Knowledge of General Education information will improve Description: Including pain rating scale, medication(s)/side effects and non-pharmacologic comfort measures Outcome: Progressing   Problem: Clinical Measurements: Goal: Ability to maintain clinical measurements within normal limits will improve Outcome: Progressing   Problem: Education: Goal: Knowledge of [place] [facility] Education information/materials will improve Outcome: Progressing Goal: Verbalization of understanding the information provided will improve Outcome: Progressing   Problem: Health Behavior/Discharge Planning: Goal: Compliance with treatment plan for underlying cause of condition will improve Outcome: Progressing

## 2022-06-11 NOTE — BHH Group Notes (Signed)
[facility] Group Notes:  (Nursing/MHT/Case Management/Adjunct)  Date:  06/11/2022  Time:  8:52 PM  Type of Therapy:   Wrap up  Participation Level:  Did Not Attend  Summary of Progress/Problems:  Travis Sandoval 06/11/2022, 8:52 PM

## 2022-06-11 NOTE — Progress Notes (Signed)
[facility] MD Progress Note  06/11/2022 4:30 PM Travis Sandoval  MRN:  [id number] Subjective: No change.  Stays in his room much of the time but pleasant when approached.  Takes his medicine.  No physical complaints Principal Problem: Schizophrenia ([place]) Diagnosis: Principal Problem:   Schizophrenia ([place]) Active Problems:   Psychosis ([place])  Total Time spent with patient: 30 minutes  Past Psychiatric History: History of schizophrenia  Past Medical History:  Past Medical History:  Diagnosis Date   ADHD    Bipolar 1 disorder ([place])    Hepatitis C    Schizoaffective disorder ([place])    History reviewed. No pertinent surgical history. Family History:  Family History  Family history unknown: Yes   Family Psychiatric  History: See previous Social History:  Social History   Substance and Sexual Activity  Alcohol Use Not Currently     Social History   Substance and Sexual Activity  Drug Use Not Currently    Social History   Socioeconomic History   Marital status: Unknown    Spouse name: Not on file   Number of children: Not on file   Years of education: Not on file   Highest education level: Not on file  Occupational History   Not on file  Tobacco Use   Smoking status: Every Day    Packs/day: 0.25    Years: 15.00    Total pack years: 3.75    Types: Cigarettes   Smokeless tobacco: Not on file  Vaping Use   Vaping Use: Not on file  Substance and Sexual Activity   Alcohol use: Not Currently   Drug use: Not Currently   Sexual activity: Not Currently  Other Topics Concern   Not on file  Social History Narrative   Not on file   Social Determinants of Health   Financial Resource Strain: Not on file  Food Insecurity: Unknown (05/18/2022)   Hunger Vital Sign    Worried About Running Out of Food in the Last Year: Patient refused    [place] in the Last Year: Patient refused  Transportation Needs: Unknown (05/18/2022)   [place] - Radiographer, therapeutic (Medical): Patient refused    Lack of Transportation (Non-Medical): Patient refused  Physical Activity: Not on file  Stress: Not on file  Social Connections: Not on file   Additional Social History:                         Sleep: Fair  Appetite:  Fair  Current Medications: Current Facility-Administered Medications  Medication Dose Route Frequency Provider Last Rate Last Admin   acetaminophen (TYLENOL) tablet 650 mg  650 mg Oral Q6H PRN [name], NP       alum & mag hydroxide-simeth (MAALOX/MYLANTA) 200-200-20 MG/5ML suspension 30 mL  30 mL Oral Q4H PRN [name], NP   30 mL at 05/22/22 0816   cloZAPine (CLOZARIL) tablet 250 mg  250 mg Oral QHS Chistine Dematteo T, MD   250 mg at 06/10/22 2112   docusate sodium (COLACE) capsule 100 mg  100 mg Oral BID Chrissa Meetze T, MD   100 mg at 06/11/22 0759   famotidine (PEPCID) tablet 20 mg  20 mg Oral BID Zhaire Locker T, MD   20 mg at 06/11/22 0759   hydrOXYzine (ATARAX) tablet 50 mg  50 mg Oral Q6H PRN Ashlay Altieri, [name], MD   50 mg at 05/17/22 [id number]  magnesium citrate solution 1 Bottle  1 Bottle Oral Daily PRN Griff Badley T, MD   1 Bottle at 06/09/22 1719   magnesium hydroxide (MILK OF MAGNESIA) suspension 30 mL  30 mL Oral Daily PRN [name], NP   30 mL at 06/08/22 0755   nicotine polacrilex (NICORETTE) gum 2 mg  2 mg Oral PRN Karver Fadden T, MD   2 mg at 04/23/22 2200   paliperidone (INVEGA SUSTENNA) injection 156 mg  156 mg Intramuscular Q28 days Taylon Louison T, MD   156 mg at 05/30/22 1100   senna-docusate (Senokot-S) tablet 2 tablet  2 tablet Oral Daily PRN Hayk Divis, [name], MD   2 tablet at 06/07/22 2118   ziprasidone (GEODON) injection 20 mg  20 mg Intramuscular Q12H PRN [name], MD        Lab Results: No results found for this or any previous visit (from the past 48 hour(s)).  Blood Alcohol level:  Lab Results  Component Value Date   ETH <10 0000000    Metabolic Disorder  Labs: Lab Results  Component Value Date   HGBA1C 5.5 04/24/2022   MPG 111 04/24/2022   No results found for: "PROLACTIN" Lab Results  Component Value Date   CHOL 220 (H) 04/24/2022   TRIG 700 (H) 04/24/2022   HDL 35 (L) 04/24/2022   CHOLHDL 6.3 04/24/2022   VLDL UNABLE TO CALCULATE IF TRIGLYCERIDE OVER 400 mg/dL 04/24/2022   LDLCALC UNABLE TO CALCULATE IF TRIGLYCERIDE OVER 400 mg/dL 04/24/2022    Physical Findings: AIMS: Facial and Oral Movements Muscles of Facial Expression: None, normal Lips and Perioral Area: None, normal Jaw: None, normal Tongue: None, normal,Extremity Movements Upper (arms, wrists, hands, fingers): None, normal Lower (legs, knees, ankles, toes): None, normal, Trunk Movements Neck, shoulders, hips: None, normal, Overall Severity Severity of abnormal movements (highest score from questions above): None, normal Incapacitation due to abnormal movements: None, normal Patient's awareness of abnormal movements (rate only patient's report): No Awareness, Dental Status Current problems with teeth and/or dentures?: No Does patient usually wear dentures?: No  CIWA:    COWS:     Musculoskeletal: Strength & Muscle Tone: within normal limits Gait & Station: normal Patient leans: N/A  Psychiatric Specialty Exam:  Presentation  General Appearance:  Disheveled  Eye Contact: Minimal  Speech: Pressured  Speech Volume: Normal  Handedness: Right   Mood and Affect  Mood: Euphoric; Irritable  Affect: Inappropriate   Thought Process  Thought Processes: Disorganized  Descriptions of Associations:Loose  Orientation:Partial  Thought Content:Illogical; Scattered  History of Schizophrenia/Schizoaffective disorder:No data recorded Duration of Psychotic Symptoms:No data recorded Hallucinations:No data recorded Ideas of Reference:Delusions; Paranoia  Suicidal Thoughts:No data recorded Homicidal Thoughts:No data recorded  Sensorium   Memory: Immediate Poor; Recent Poor; Remote Poor  Judgment: Poor  Insight: Poor   Executive Functions  Concentration: Fair  Attention Span: Fair  Recall: Poor  Fund of Knowledge: Poor  Language: Poor   Psychomotor Activity  Psychomotor Activity:No data recorded  Assets  Assets: Desire for Improvement; Social Support   Sleep  Sleep:No data recorded   Physical Exam: Physical Exam Vitals and nursing note reviewed.  Constitutional:      Appearance: Normal appearance.  HENT:     Head: Normocephalic and atraumatic.     Mouth/Throat:     Pharynx: Oropharynx is clear.  Eyes:     Pupils: Pupils are equal, round, and reactive to light.  Cardiovascular:     Rate and Rhythm: Normal rate and  regular rhythm.  Pulmonary:     Effort: Pulmonary effort is normal.     Breath sounds: Normal breath sounds.  Abdominal:     General: Abdomen is flat.     Palpations: Abdomen is soft.  Musculoskeletal:        General: Normal range of motion.  Skin:    General: Skin is warm and dry.  Neurological:     General: No focal deficit present.     Mental Status: He is alert. Mental status is at baseline.  Psychiatric:        Mood and Affect: Mood normal.        Thought Content: Thought content normal.    Review of Systems  Constitutional: Negative.   HENT: Negative.    Eyes: Negative.   Respiratory: Negative.    Cardiovascular: Negative.   Gastrointestinal: Negative.   Musculoskeletal: Negative.   Skin: Negative.   Neurological: Negative.   Psychiatric/Behavioral: Negative.     Blood pressure 111/70, pulse 99, temperature 97.9 F (36.6 C), temperature source Oral, resp. rate 16, height 5' 9"$  (1.753 m), weight 96.4 kg, SpO2 97 %. Body mass index is 31.38 kg/m.   Treatment Plan Summary: Plan absolutely stable with no need for any change to treatment plan.  Just waiting on discharge  Alethia Berthold, MD 06/11/2022, 4:30 PM

## 2022-06-11 NOTE — Group Note (Signed)
Recreation Therapy Group Note   Group Topic:Coping Skills  Group Date: 06/11/2022 Start Time: 1000 End Time: 1035 Facilitators: Leona Carry, CTRS Location:  Craft Room  Group Description: Mind Map.  Patient was provided a blank template of a diagram with 32 blank boxes in a tiered system, branching from the center (similar to a bubble chart). LRT directed patients to label the middle of the diagram "Coping Skills". LRT and patients then came up with 8 different coping skills as examples. Pt were directed to record their coping skills in the 2nd tier boxes closest to the center.  Patients would then share their coping skills with the group as LRT wrote them out. LRT gave a handout of 100 different coping skills at the end of group.   Affect/Mood: N/A   Participation Level: Did not attend    Clinical Observations/Individualized Feedback: Travis Sandoval did not attend group due to resting in their room.   Plan: Continue to engage patient in RT group sessions 2-3x/week.   Vilma Prader, LRT, CTRS 06/11/2022 11:02 AM

## 2022-06-11 NOTE — Group Note (Signed)
[facility] LCSW Group Therapy Note   Group Date: 06/11/2022 Start Time: 1300 End Time: 1400  Type of Therapy/Topic:  Group Therapy:  Feelings about Diagnosis  Participation Level:  Did Not Attend    Description of Group:    This group will allow patients to explore their thoughts and feelings about diagnoses they have received. Patients will be guided to explore their level of understanding and acceptance of these diagnoses. Facilitator will encourage patients to process their thoughts and feelings about the reactions of others to their diagnosis, and will guide patients in identifying ways to discuss their diagnosis with significant others in their lives. This group will be process-oriented, with patients participating in exploration of their own experiences as well as giving and receiving support and challenge from other group members.   Therapeutic Goals: 1. Patient will demonstrate understanding of diagnosis as evidence by identifying two or more symptoms of the disorder:  2. Patient will be able to express two feelings regarding the diagnosis 3. Patient will demonstrate ability to communicate their needs through discussion and/or role plays  Summary of Patient Progress: X   Therapeutic Modalities:   Cognitive Behavioral Therapy Brief Therapy Feelings Identification    Travis Harris, LCSW

## 2022-06-11 NOTE — Progress Notes (Signed)
Patient calm and pleasant during assessment denying SI/HI/AVH. Pt observed interacting appropriately with staff and peers on the unit. Pt compliant with medication administration per MD orders. Pt given education, support, and encouragement to be acitve in his treatment plan. Pt being monitored Q 15 minutes for safety per unit protocol, remains safe on the unit

## 2022-06-12 DIAGNOSIS — F203 Undifferentiated schizophrenia: Secondary | ICD-10-CM | POA: Diagnosis not present

## 2022-06-12 LAB — CBC WITH DIFFERENTIAL/PLATELET
Abs Immature Granulocytes: 0.02 10*3/uL (ref 0.00–0.07)
Basophils Absolute: 0.1 10*3/uL (ref 0.0–0.1)
Basophils Relative: 1 %
Eosinophils Absolute: 0.1 10*3/uL (ref 0.0–0.5)
Eosinophils Relative: 2 %
HCT: 41.1 % (ref 39.0–52.0)
Hemoglobin: 13.7 g/dL (ref 13.0–17.0)
Immature Granulocytes: 0 %
Lymphocytes Relative: 29 %
Lymphs Abs: 2.6 10*3/uL (ref 0.7–4.0)
MCH: 29.8 pg (ref 26.0–34.0)
MCHC: 33.3 g/dL (ref 30.0–36.0)
MCV: 89.5 fL (ref 80.0–100.0)
Monocytes Absolute: 0.6 10*3/uL (ref 0.1–1.0)
Monocytes Relative: 7 %
Neutro Abs: 5.5 10*3/uL (ref 1.7–7.7)
Neutrophils Relative %: 61 %
Platelets: 219 10*3/uL (ref 150–400)
RBC: 4.59 MIL/uL (ref 4.22–5.81)
RDW: 12.1 % (ref 11.5–15.5)
WBC: 8.9 10*3/uL (ref 4.0–10.5)
nRBC: 0 % (ref 0.0–0.2)

## 2022-06-12 NOTE — Group Note (Signed)
Recreation Therapy Group Note   Group Topic:Relaxation  Group Date: 06/12/2022 Start Time: 1000 End Time: 1045 Facilitators: Vilma Prader, LRT, CTRS Location:  Craft Room  Group Description: PMR (Progressive Muscle Relaxation). LRT asks patients their current level of stress/anxiety from 1-10, with 10 being the highest. LRT educates patients on what PMR is and the benefits that come from it. Patients are asked to sit with their feet flat on the floor while sitting up and all the way back in their chair, if possible. LRT follows prompt that requires the patients to tense and release different muscles in their body and focus on their breathing. During session, lights are off and soft music is being played. At the end of the prompt, LRT asks patients to rank their current levels of stress/anxiety from 1-10, 10 being the highest.   Affect/Mood: N/A   Participation Level: Did not attend    Clinical Observations/Individualized Feedback: Travis Sandoval did not attend group due to resting in their room.  Plan: Continue to engage patient in RT group sessions 2-3x/week.   Vilma Prader, LRT, CTRS 06/12/2022 11:06 AM

## 2022-06-12 NOTE — Consult Note (Signed)
Pharmacy Consult - Clozapine     42 yo male ordered clozapine 100 mg PO daily  This patient's order has been reviewed for prescribing contraindications.    Clozapine REMS enrollment Verified: yes on 04/17/22 REMS patient ID: [id number] Current Outpatient Monitoring: Every 2 weeks   Home Regimen:  225 mg po BID Last dose: unknown   Dose Adjustments This Admission: Dose started at clozapine 100 mg po daily Dose is currently clozapine 250 mg po HS (started 1/22)   Labs: Date    ANC    Submitted? 12/27 2800 Yes 01/03 3500 Yes 01/10   3100 Yes 01/24 3000 Yes 01/31 4400 yes   02/07 4300 yes 02/14 4500 Yes 02/21 5500 Yes  Plan: Continue clozapine 250 mg po HS Monitor ANC at least weekly while inpatient     Aubery Lapping, PharmD Clinical Pharmacist  06/12/2022 11:13 AM

## 2022-06-12 NOTE — Plan of Care (Signed)
D- Patient alert and oriented. Patient presented in a pleasant mood on assessment reporting that he slept good last night and had no complaints to voice to this Probation officer. Patient denied SI, HI, AVH, and pain at this time. Patient also denied any signs/symptoms of depression and anxiety. Patient had no stated goals for today, but he did state that he's "just ready to go".   A- Scheduled medications administered to patient, per MD orders. Support and encouragement provided.  Routine safety checks conducted every 15 minutes.  Patient informed to notify staff with problems or concerns.  R- No adverse drug reactions noted. Patient contracts for safety at this time. Patient compliant with medications. Patient receptive, calm, and cooperative. Patient isolates to room, except for meals and medication. Patient remains safe at this time.  Problem: Education: Goal: Knowledge of General Education information will improve Description: Including pain rating scale, medication(s)/side effects and non-pharmacologic comfort measures Outcome: Progressing   Problem: Health Behavior/Discharge Planning: Goal: Ability to manage health-related needs will improve Outcome: Progressing   Problem: Clinical Measurements: Goal: Ability to maintain clinical measurements within normal limits will improve Outcome: Progressing Goal: Will remain free from infection Outcome: Progressing Goal: Diagnostic test results will improve Outcome: Progressing Goal: Respiratory complications will improve Outcome: Progressing Goal: Cardiovascular complication will be avoided Outcome: Progressing   Problem: Activity: Goal: Risk for activity intolerance will decrease Outcome: Progressing   Problem: Nutrition: Goal: Adequate nutrition will be maintained Outcome: Progressing   Problem: Coping: Goal: Level of anxiety will decrease Outcome: Progressing   Problem: Elimination: Goal: Will not experience complications related to  urinary retention Outcome: Progressing   Problem: Pain Managment: Goal: General experience of comfort will improve Outcome: Progressing   Problem: Safety: Goal: Ability to remain free from injury will improve Outcome: Progressing   Problem: Skin Integrity: Goal: Risk for impaired skin integrity will decrease Outcome: Progressing   Problem: Education: Goal: Knowledge of [place] General Education information/materials will improve Outcome: Progressing Goal: Emotional status will improve Outcome: Progressing Goal: Mental status will improve Outcome: Progressing Goal: Verbalization of understanding the information provided will improve Outcome: Progressing   Problem: Activity: Goal: Interest or engagement in activities will improve Outcome: Progressing Goal: Sleeping patterns will improve Outcome: Progressing   Problem: Coping: Goal: Ability to verbalize frustrations and anger appropriately will improve Outcome: Progressing Goal: Ability to demonstrate self-control will improve Outcome: Progressing   Problem: Health Behavior/Discharge Planning: Goal: Identification of resources available to assist in meeting health care needs will improve Outcome: Progressing Goal: Compliance with treatment plan for underlying cause of condition will improve Outcome: Progressing   Problem: Physical Regulation: Goal: Ability to maintain clinical measurements within normal limits will improve Outcome: Progressing   Problem: Safety: Goal: Periods of time without injury will increase Outcome: Progressing   Problem: Activity: Goal: Will verbalize the importance of balancing activity with adequate rest periods Outcome: Progressing   Problem: Education: Goal: Will be free of psychotic symptoms Outcome: Progressing Goal: Knowledge of the prescribed therapeutic regimen will improve Outcome: Progressing   Problem: Coping: Goal: Coping ability will improve Outcome:  Progressing Goal: Will verbalize feelings Outcome: Progressing   Problem: Health Behavior/Discharge Planning: Goal: Compliance with prescribed medication regimen will improve Outcome: Progressing   Problem: Nutritional: Goal: Ability to achieve adequate nutritional intake will improve Outcome: Progressing   Problem: Role Relationship: Goal: Ability to communicate needs accurately will improve Outcome: Progressing Goal: Ability to interact with others will improve Outcome: Progressing   Problem: Safety: Goal: Ability to  redirect hostility and anger into socially appropriate behaviors will improve Outcome: Progressing Goal: Ability to remain free from injury will improve Outcome: Progressing   Problem: Self-Care: Goal: Ability to participate in self-care as condition permits will improve Outcome: Progressing   Problem: Self-Concept: Goal: Will verbalize positive feelings about self Outcome: Progressing

## 2022-06-12 NOTE — Progress Notes (Signed)
Patient calm and pleasant during assessment denying SI/HI/AVH. Pt observed interacting appropriately with staff and peers on the unit. Pt compliant with medication administration per MD orders. Pt given education, support, and encouragement to be acitve in his treatment plan. Pt being monitored Q 15 minutes for safety per unit protocol, remains safe on the unit

## 2022-06-12 NOTE — Progress Notes (Signed)
[facility] MD Progress Note  06/12/2022 4:29 PM Travis Sandoval  MRN:  [id number] Subjective: Follow-up patient with schizophrenia.  Completely stable.  No change to clinical presentation at all.  No behavior problems Principal Problem: Schizophrenia ([place]) Diagnosis: Principal Problem:   Schizophrenia ([place]) Active Problems:   Psychosis ([place])  Total Time spent with patient: 20 minutes  Past Psychiatric History: Past history of schizophrenia  Past Medical History:  Past Medical History:  Diagnosis Date   ADHD    Bipolar 1 disorder ([place])    Hepatitis C    Schizoaffective disorder ([place])    History reviewed. No pertinent surgical history. Family History:  Family History  Family history unknown: Yes   Family Psychiatric  History: See previous Social History:  Social History   Substance and Sexual Activity  Alcohol Use Not Currently     Social History   Substance and Sexual Activity  Drug Use Not Currently    Social History   Socioeconomic History   Marital status: Unknown    Spouse name: Not on file   Number of children: Not on file   Years of education: Not on file   Highest education level: Not on file  Occupational History   Not on file  Tobacco Use   Smoking status: Every Day    Packs/day: 0.25    Years: 15.00    Total pack years: 3.75    Types: Cigarettes   Smokeless tobacco: Not on file  Vaping Use   Vaping Use: Not on file  Substance and Sexual Activity   Alcohol use: Not Currently   Drug use: Not Currently   Sexual activity: Not Currently  Other Topics Concern   Not on file  Social History Narrative   Not on file   Social Determinants of Health   Financial Resource Strain: Not on file  Food Insecurity: Unknown (05/18/2022)   Hunger Vital Sign    Worried About Running Out of Food in the Last Year: Patient refused    [place] in the Last Year: Patient refused  Transportation Needs: Unknown (05/18/2022)   [place] - Radiographer, therapeutic (Medical): Patient refused    Lack of Transportation (Non-Medical): Patient refused  Physical Activity: Not on file  Stress: Not on file  Social Connections: Not on file   Additional Social History:                         Sleep: Fair  Appetite:  Fair  Current Medications: Current Facility-Administered Medications  Medication Dose Route Frequency Provider Last Rate Last Admin   acetaminophen (TYLENOL) tablet 650 mg  650 mg Oral Q6H PRN [name], NP       alum & mag hydroxide-simeth (MAALOX/MYLANTA) 200-200-20 MG/5ML suspension 30 mL  30 mL Oral Q4H PRN [name], NP   30 mL at 05/22/22 0816   cloZAPine (CLOZARIL) tablet 250 mg  250 mg Oral QHS April Colter T, MD   250 mg at 06/11/22 2103   docusate sodium (COLACE) capsule 100 mg  100 mg Oral BID Tyerra Loretto T, MD   100 mg at 06/12/22 0802   famotidine (PEPCID) tablet 20 mg  20 mg Oral BID Mauriah Mcmillen T, MD   20 mg at 06/12/22 0802   hydrOXYzine (ATARAX) tablet 50 mg  50 mg Oral Q6H PRN Cari Vandeberg, [name], MD   50 mg at 05/17/22 0944   magnesium citrate solution  1 Bottle  1 Bottle Oral Daily PRN Starr Urias, [name], MD   1 Bottle at 06/09/22 1719   magnesium hydroxide (MILK OF MAGNESIA) suspension 30 mL  30 mL Oral Daily PRN [name], NP   30 mL at 06/08/22 0755   nicotine polacrilex (NICORETTE) gum 2 mg  2 mg Oral PRN Shenelle Klas, [name], MD   2 mg at 04/23/22 2200   paliperidone (INVEGA SUSTENNA) injection 156 mg  156 mg Intramuscular Q28 days Brynnlee Cumpian, [name], MD   156 mg at 05/30/22 1100   senna-docusate (Senokot-S) tablet 2 tablet  2 tablet Oral Daily PRN Lopez Dentinger, [name], MD   2 tablet at 06/07/22 2118   ziprasidone (GEODON) injection 20 mg  20 mg Intramuscular Q12H PRN [name], MD        Lab Results:  Results for orders placed or performed during the hospital encounter of 04/16/22 (from the past 48 hour(s))  CBC with Differential/Platelet     Status: None   Collection Time:  06/12/22  9:47 AM  Result Value Ref Range   WBC 8.9 4.0 - 10.5 K/uL   RBC 4.59 4.22 - 5.81 MIL/uL   Hemoglobin 13.7 13.0 - 17.0 g/dL   HCT 41.1 39.0 - 52.0 %   MCV 89.5 80.0 - 100.0 fL   MCH 29.8 26.0 - 34.0 pg   MCHC 33.3 30.0 - 36.0 g/dL   RDW 12.1 11.5 - 15.5 %   Platelets 219 150 - 400 K/uL   nRBC 0.0 0.0 - 0.2 %   Neutrophils Relative % 61 %   Neutro Abs 5.5 1.7 - 7.7 K/uL   Lymphocytes Relative 29 %   Lymphs Abs 2.6 0.7 - 4.0 K/uL   Monocytes Relative 7 %   Monocytes Absolute 0.6 0.1 - 1.0 K/uL   Eosinophils Relative 2 %   Eosinophils Absolute 0.1 0.0 - 0.5 K/uL   Basophils Relative 1 %   Basophils Absolute 0.1 0.0 - 0.1 K/uL   Immature Granulocytes 0 %   Abs Immature Granulocytes 0.02 0.00 - 0.07 K/uL    Comment: Performed at [facility], [address] [place], [place] [address]    Blood Alcohol level:  Lab Results  Component Value Date   [facility] <10 0000000    Metabolic Disorder Labs: Lab Results  Component Value Date   HGBA1C 5.5 04/24/2022   MPG 111 04/24/2022   No results found for: "PROLACTIN" Lab Results  Component Value Date   CHOL 220 (H) 04/24/2022   TRIG 700 (H) 04/24/2022   HDL 35 (L) 04/24/2022   CHOLHDL 6.3 04/24/2022   VLDL UNABLE TO CALCULATE IF TRIGLYCERIDE OVER 400 mg/dL 04/24/2022   LDLCALC UNABLE TO CALCULATE IF TRIGLYCERIDE OVER 400 mg/dL 04/24/2022    Physical Findings: AIMS: Facial and Oral Movements Muscles of Facial Expression: None, normal Lips and Perioral Area: None, normal Jaw: None, normal Tongue: None, normal,Extremity Movements Upper (arms, wrists, hands, fingers): None, normal Lower (legs, knees, ankles, toes): None, normal, Trunk Movements Neck, shoulders, hips: None, normal, Overall Severity Severity of abnormal movements (highest score from questions above): None, normal Incapacitation due to abnormal movements: None, normal Patient's awareness of abnormal movements (rate only patient's report): No  Awareness, Dental Status Current problems with teeth and/or dentures?: No Does patient usually wear dentures?: No  CIWA:    COWS:     Musculoskeletal: Strength & Muscle Tone: within normal limits Gait & Station: normal Patient leans: N/A  Psychiatric Specialty Exam:  Presentation  General Appearance:  Disheveled  Eye Contact: Minimal  Speech: Pressured  Speech Volume: Normal  Handedness: Right   Mood and Affect  Mood: Euphoric; Irritable  Affect: Inappropriate   Thought Process  Thought Processes: Disorganized  Descriptions of Associations:Loose  Orientation:Partial  Thought Content:Illogical; Scattered  History of Schizophrenia/Schizoaffective disorder:No data recorded Duration of Psychotic Symptoms:No data recorded Hallucinations:No data recorded Ideas of Reference:Delusions; Paranoia  Suicidal Thoughts:No data recorded Homicidal Thoughts:No data recorded  Sensorium  Memory: Immediate Poor; Recent Poor; Remote Poor  Judgment: Poor  Insight: Poor   Executive Functions  Concentration: Fair  Attention Span: Fair  Recall: Poor  Fund of Knowledge: Poor  Language: Poor   Psychomotor Activity  Psychomotor Activity:No data recorded  Assets  Assets: Desire for Improvement; Social Support   Sleep  Sleep:No data recorded   Physical Exam: Physical Exam Vitals and nursing note reviewed.  Constitutional:      Appearance: Normal appearance.  HENT:     Head: Normocephalic and atraumatic.     Mouth/Throat:     Pharynx: Oropharynx is clear.  Eyes:     Pupils: Pupils are equal, round, and reactive to light.  Cardiovascular:     Rate and Rhythm: Normal rate and regular rhythm.  Pulmonary:     Effort: Pulmonary effort is normal.     Breath sounds: Normal breath sounds.  Abdominal:     General: Abdomen is flat.     Palpations: Abdomen is soft.  Musculoskeletal:        General: Normal range of motion.  Skin:    General:  Skin is warm and dry.  Neurological:     General: No focal deficit present.     Mental Status: He is alert. Mental status is at baseline.  Psychiatric:        Attention and Perception: Attention normal.        Mood and Affect: Mood normal.        Speech: Speech normal.        Behavior: Behavior normal.        Thought Content: Thought content normal.        Cognition and Memory: Memory is impaired.    Review of Systems  Constitutional: Negative.   HENT: Negative.    Eyes: Negative.   Respiratory: Negative.    Cardiovascular: Negative.   Gastrointestinal: Negative.   Musculoskeletal: Negative.   Skin: Negative.   Neurological: Negative.   Psychiatric/Behavioral: Negative.     Blood pressure 121/78, pulse (!) 103, temperature 97.9 F (36.6 C), temperature source Oral, resp. rate 18, height 5' 9"$  (1.753 m), weight 96.4 kg, SpO2 97 %. Body mass index is 31.38 kg/m.   Treatment Plan Summary: Medication management and Plan tolerating medication well.  No change to treatment plan.  Continue to focus on placement  Alethia Berthold, MD 06/12/2022, 4:29 PM

## 2022-06-12 NOTE — Group Note (Signed)
[facility] LCSW Group Therapy Note   Group Date: 06/12/2022 Start Time: 1310 End Time: 1400   Type of Therapy/Topic:  Group Therapy:  Emotion Regulation  Participation Level:  Did Not Attend   Mood:  Description of Group:    The purpose of this group is to assist patients in learning to regulate negative emotions and experience positive emotions. Patients will be guided to discuss ways in which they have been vulnerable to their negative emotions. These vulnerabilities will be juxtaposed with experiences of positive emotions or situations, and patients challenged to use positive emotions to combat negative ones. Special emphasis will be placed on coping with negative emotions in conflict situations, and patients will process healthy conflict resolution skills.  Therapeutic Goals: Patient will identify two positive emotions or experiences to reflect on in order to balance out negative emotions:  Patient will label two or more emotions that they find the most difficult to experience:  Patient will be able to demonstrate positive conflict resolution skills through discussion or role plays:   Summary of Patient Progress:   X    Therapeutic Modalities:   Cognitive Behavioral Therapy Feelings Identification Dialectical Behavioral Therapy   Rozann Lesches, LCSW

## 2022-06-13 DIAGNOSIS — F203 Undifferentiated schizophrenia: Secondary | ICD-10-CM | POA: Diagnosis not present

## 2022-06-13 NOTE — Group Note (Signed)
[facility] LCSW Group Therapy Note   Group Date: 06/13/2022 Start Time: 1300 End Time: 1400   Type of Therapy/Topic:  Group Therapy:  Balance in Life  Participation Level:  Did Not Attend   Description of Group:    This group will address the concept of balance and how it feels and looks when one is unbalanced. Patients will be encouraged to process areas in their lives that are out of balance, and identify reasons for remaining unbalanced. Facilitators will guide patients utilizing problem- solving interventions to address and correct the stressor making their life unbalanced. Understanding and applying boundaries will be explored and addressed for obtaining  and maintaining a balanced life. Patients will be encouraged to explore ways to assertively make their unbalanced needs known to significant others in their lives, using other group members and facilitator for support and feedback.  Therapeutic Goals: Patient will identify two or more emotions or situations they have that consume much of in their lives. Patient will identify signs/triggers that life has become out of balance:  Patient will identify two ways to set boundaries in order to achieve balance in their lives:  Patient will demonstrate ability to communicate their needs through discussion and/or role plays  Summary of Patient Progress: X   Therapeutic Modalities:   Cognitive Behavioral Therapy Solution-Focused Therapy Assertiveness Training   Shirl Harris, LCSW

## 2022-06-13 NOTE — Group Note (Signed)
Recreation Therapy Group Note   Group Topic:Healthy Decision Making  Group Date: 06/13/2022 Start Time: 1000 End Time: 1045 Facilitators: Vilma Prader, LRT, CTRS Location:  Dayroom  Group Decision: [employer]. Patients and LRT tossed around a beach ball while listening to music. At random, LRT would stop the music and ask pt who was holding the ball at that time to answer a question: "Give me an example of a good decision, give me an example of a bad decision, what would a consequence of that decision be, are all consequences bad?" Pt's then filled out a Decision-Making Inventory to see what type of decision maker they are. LRT educated pts on different styles of decision making and wrote it on the board for all to see. LRT and pts shared their decision-making style with the group and processed on whether they thought it was an accurate representation of them or not.   Affect/Mood: N/A   Participation Level: Did not attend    Clinical Observations/Individualized Feedback: Jyles did not attend group due to resting in his room.   Plan: Continue to engage patient in RT group sessions 2-3x/week.   Vilma Prader, LRT, CTRS 06/13/2022 11:16 AM

## 2022-06-13 NOTE — Progress Notes (Signed)
Patient calm and pleasant during assessment denying SI/HI/AVH. Pt observed interacting appropriately with staff and peers on the unit. Pt compliant with medication administration per MD orders. Pt given education, support, and encouragement to be acitve in his treatment plan. Pt being monitored Q 15 minutes for safety per unit protocol, remains safe on the unit

## 2022-06-13 NOTE — Progress Notes (Signed)
[facility] MD Progress Note  06/13/2022 1:26 PM Travis Sandoval  MRN:  [id number] Subjective:  no new complaint. No change to presetation Principal Problem: Schizophrenia ([place]) Diagnosis: Principal Problem:   Schizophrenia ([place]) Active Problems:   Psychosis ([place])  Total Time spent with patient: 30 minutes  Past Psychiatric History: schizophrenia  Past Medical History:  Past Medical History:  Diagnosis Date   ADHD    Bipolar 1 disorder ([place])    Hepatitis C    Schizoaffective disorder ([place])    History reviewed. No pertinent surgical history. Family History:  Family History  Family history unknown: Yes   Family Psychiatric  History: none Social History:  Social History   Substance and Sexual Activity  Alcohol Use Not Currently     Social History   Substance and Sexual Activity  Drug Use Not Currently    Social History   Socioeconomic History   Marital status: Unknown    Spouse name: Not on file   Number of children: Not on file   Years of education: Not on file   Highest education level: Not on file  Occupational History   Not on file  Tobacco Use   Smoking status: Every Day    Packs/day: 0.25    Years: 15.00    Total pack years: 3.75    Types: Cigarettes   Smokeless tobacco: Not on file  Vaping Use   Vaping Use: Not on file  Substance and Sexual Activity   Alcohol use: Not Currently   Drug use: Not Currently   Sexual activity: Not Currently  Other Topics Concern   Not on file  Social History Narrative   Not on file   Social Determinants of Health   Financial Resource Strain: Not on file  Food Insecurity: Unknown (05/18/2022)   Hunger Vital Sign    Worried About Running Out of Food in the Last Year: Patient refused    [place] in the Last Year: Patient refused  Transportation Needs: Unknown (05/18/2022)   [place] - Hydrologist (Medical): Patient refused    Lack of Transportation (Non-Medical): Patient refused   Physical Activity: Not on file  Stress: Not on file  Social Connections: Not on file   Additional Social History:                         Sleep: Fair  Appetite:  Fair  Current Medications: Current Facility-Administered Medications  Medication Dose Route Frequency Provider Last Rate Last Admin   acetaminophen (TYLENOL) tablet 650 mg  650 mg Oral Q6H PRN [name], NP       alum & mag hydroxide-simeth (MAALOX/MYLANTA) 200-200-20 MG/5ML suspension 30 mL  30 mL Oral Q4H PRN [name], NP   30 mL at 05/22/22 0816   cloZAPine (CLOZARIL) tablet 250 mg  250 mg Oral QHS Gavriela Cashin T, MD   250 mg at 06/12/22 2116   docusate sodium (COLACE) capsule 100 mg  100 mg Oral BID Kalif Kattner T, MD   100 mg at 06/13/22 0800   famotidine (PEPCID) tablet 20 mg  20 mg Oral BID Chevy Sweigert T, MD   20 mg at 06/13/22 0800   hydrOXYzine (ATARAX) tablet 50 mg  50 mg Oral Q6H PRN Cyndel Griffey, [name], MD   50 mg at 05/17/22 0944   magnesium citrate solution 1 Bottle  1 Bottle Oral Daily PRN Corvette Orser, [name], MD   1  Bottle at 06/09/22 1719   magnesium hydroxide (MILK OF MAGNESIA) suspension 30 mL  30 mL Oral Daily PRN [name], NP   30 mL at 06/08/22 0755   nicotine polacrilex (NICORETTE) gum 2 mg  2 mg Oral PRN Teirra Carapia, [name], MD   2 mg at 04/23/22 2200   paliperidone (INVEGA SUSTENNA) injection 156 mg  156 mg Intramuscular Q28 days Ariam Mol, [name], MD   156 mg at 05/30/22 1100   senna-docusate (Senokot-S) tablet 2 tablet  2 tablet Oral Daily PRN Laney Louderback, [name], MD   2 tablet at 06/07/22 2118   ziprasidone (GEODON) injection 20 mg  20 mg Intramuscular Q12H PRN [name], MD        Lab Results:  Results for orders placed or performed during the hospital encounter of 04/16/22 (from the past 48 hour(s))  CBC with Differential/Platelet     Status: None   Collection Time: 06/12/22  9:47 AM  Result Value Ref Range   WBC 8.9 4.0 - 10.5 K/uL   RBC 4.59 4.22 - 5.81 MIL/uL    Hemoglobin 13.7 13.0 - 17.0 g/dL   HCT 41.1 39.0 - 52.0 %   MCV 89.5 80.0 - 100.0 fL   MCH 29.8 26.0 - 34.0 pg   MCHC 33.3 30.0 - 36.0 g/dL   RDW 12.1 11.5 - 15.5 %   Platelets 219 150 - 400 K/uL   nRBC 0.0 0.0 - 0.2 %   Neutrophils Relative % 61 %   Neutro Abs 5.5 1.7 - 7.7 K/uL   Lymphocytes Relative 29 %   Lymphs Abs 2.6 0.7 - 4.0 K/uL   Monocytes Relative 7 %   Monocytes Absolute 0.6 0.1 - 1.0 K/uL   Eosinophils Relative 2 %   Eosinophils Absolute 0.1 0.0 - 0.5 K/uL   Basophils Relative 1 %   Basophils Absolute 0.1 0.0 - 0.1 K/uL   Immature Granulocytes 0 %   Abs Immature Granulocytes 0.02 0.00 - 0.07 K/uL    Comment: Performed at [facility], [address] [place], [place] [address]    Blood Alcohol level:  Lab Results  Component Value Date   [facility] <10 0000000    Metabolic Disorder Labs: Lab Results  Component Value Date   HGBA1C 5.5 04/24/2022   MPG 111 04/24/2022   No results found for: "PROLACTIN" Lab Results  Component Value Date   CHOL 220 (H) 04/24/2022   TRIG 700 (H) 04/24/2022   HDL 35 (L) 04/24/2022   CHOLHDL 6.3 04/24/2022   VLDL UNABLE TO CALCULATE IF TRIGLYCERIDE OVER 400 mg/dL 04/24/2022   LDLCALC UNABLE TO CALCULATE IF TRIGLYCERIDE OVER 400 mg/dL 04/24/2022    Physical Findings: AIMS: Facial and Oral Movements Muscles of Facial Expression: None, normal Lips and Perioral Area: None, normal Jaw: None, normal Tongue: None, normal,Extremity Movements Upper (arms, wrists, hands, fingers): None, normal Lower (legs, knees, ankles, toes): None, normal, Trunk Movements Neck, shoulders, hips: None, normal, Overall Severity Severity of abnormal movements (highest score from questions above): None, normal Incapacitation due to abnormal movements: None, normal Patient's awareness of abnormal movements (rate only patient's report): No Awareness, Dental Status Current problems with teeth and/or dentures?: No Does patient usually wear  dentures?: No  CIWA:    COWS:     Musculoskeletal: Strength & Muscle Tone: within normal limits Gait & Station: normal Patient leans: N/A  Psychiatric Specialty Exam:  Presentation  General Appearance:  Disheveled  Eye Contact: Minimal  Speech: Pressured  Speech Volume: Normal  Handedness: Right   Mood and Affect  Mood: Euphoric; Irritable  Affect: Inappropriate   Thought Process  Thought Processes: Disorganized  Descriptions of Associations:Loose  Orientation:Partial  Thought Content:Illogical; Scattered  History of Schizophrenia/Schizoaffective disorder:No data recorded Duration of Psychotic Symptoms:No data recorded Hallucinations:No data recorded Ideas of Reference:Delusions; Paranoia  Suicidal Thoughts:No data recorded Homicidal Thoughts:No data recorded  Sensorium  Memory: Immediate Poor; Recent Poor; Remote Poor  Judgment: Poor  Insight: Poor   Executive Functions  Concentration: Fair  Attention Span: Fair  Recall: Poor  Fund of Knowledge: Poor  Language: Poor   Psychomotor Activity  Psychomotor Activity:No data recorded  Assets  Assets: Desire for Improvement; Social Support   Sleep  Sleep:No data recorded   Physical Exam: Physical Exam Vitals and nursing note reviewed.  Constitutional:      Appearance: Normal appearance.  HENT:     Head: Normocephalic and atraumatic.     Mouth/Throat:     Pharynx: Oropharynx is clear.  Eyes:     Pupils: Pupils are equal, round, and reactive to light.  Cardiovascular:     Rate and Rhythm: Normal rate and regular rhythm.  Pulmonary:     Effort: Pulmonary effort is normal.     Breath sounds: Normal breath sounds.  Abdominal:     General: Abdomen is flat.     Palpations: Abdomen is soft.  Musculoskeletal:        General: Normal range of motion.  Skin:    General: Skin is warm and dry.  Neurological:     General: No focal deficit present.     Mental Status: He  is alert. Mental status is at baseline.  Psychiatric:        Attention and Perception: Attention normal.        Mood and Affect: Mood normal. Affect is blunt.        Speech: Speech is delayed.        Behavior: Behavior is slowed.        Thought Content: Thought content is paranoid.        Cognition and Memory: Cognition normal.    Review of Systems  Constitutional: Negative.   HENT: Negative.    Eyes: Negative.   Respiratory: Negative.    Cardiovascular: Negative.   Gastrointestinal: Negative.   Musculoskeletal: Negative.   Skin: Negative.   Neurological: Negative.   Psychiatric/Behavioral: Negative.     Blood pressure 124/79, pulse (!) 110, temperature 97.7 F (36.5 C), temperature source Oral, resp. rate 18, height 5' 9"$  (1.753 m), weight 96.4 kg, SpO2 98 %. Body mass index is 31.38 kg/m.   Treatment Plan Summary: Daily contact with patient to assess and evaluate symptoms and progress in treatment, Medication management, and Plan no change to plan. Continue focus on discharge.  Alethia Berthold, MD 06/13/2022, 1:26 PM

## 2022-06-13 NOTE — [facility] Group Notes (Signed)
[facility] Group Notes:  (Nursing/MHT/Case Management/Adjunct)  Date:  06/13/2022  Time:  8:51 PM  Type of Therapy:   Wrap up  Participation Level:  Active  Participation Quality:  Appropriate  Affect:  Appropriate  Cognitive:  Alert  Insight:  Good  Engagement in Group:  Engaged and no goal  Modes of Intervention:  Support  Summary of Progress/Problems:  Nehemiah Settle 06/13/2022, 8:51 PM

## 2022-06-13 NOTE — Plan of Care (Signed)
Patient verbalized no issues. Denies SI,HI and AVH. Patient isolates to his room.Appetite and energy level good. Support and encouragement given.

## 2022-06-14 DIAGNOSIS — F203 Undifferentiated schizophrenia: Secondary | ICD-10-CM | POA: Diagnosis not present

## 2022-06-14 NOTE — [facility] Group Notes (Signed)
[facility] Group Notes:  (Nursing/MHT/Case Management/Adjunct)  Date:  06/14/2022  Time:  9:16 AM  Type of Therapy:  Psychoeducational Skills  Participation Level:  Did Not Attend   Adela Lank [facility] 06/14/2022, 9:16 AM

## 2022-06-14 NOTE — Group Note (Signed)
[place] LCSW Group Therapy Note   Group Date: 06/14/2022 Start Time: 1300 End Time: 1400  Type of Therapy and Topic:  Group Therapy:  Feelings around Relapse and Recovery  Participation Level:  Did Not Attend   Mood:  Description of Group:    Patients in this group will discuss emotions they experience before and after a relapse. They will process how experiencing these feelings, or avoidance of experiencing them, relates to having a relapse. Facilitator will guide patients to explore emotions they have related to recovery. Patients will be encouraged to process which emotions are more powerful. They will be guided to discuss the emotional reaction significant others in their lives may have to patients' relapse or recovery. Patients will be assisted in exploring ways to respond to the emotions of others without this contributing to a relapse.  Therapeutic Goals: Patient will identify two or more emotions that lead to relapse for them:  Patient will identify two emotions that result when they relapse:  Patient will identify two emotions related to recovery:  Patient will demonstrate ability to communicate their needs through discussion and/or role plays.   Summary of Patient Progress:  Group not held due to complex discharge planning.   Therapeutic Modalities:   Cognitive Behavioral Therapy Solution-Focused Therapy Assertiveness Training Relapse Prevention Therapy   Rozann Lesches, LCSW

## 2022-06-14 NOTE — Group Note (Signed)
Recreation Therapy Group Note   Group Topic:Leisure Education  Group Date: 06/14/2022 Start Time: 1000 End Time: 1045 Facilitators: Vilma Prader, LRT, CTRS Location:  Craft Room  Group Description: Leisure. Patients were given the option to choose from coloring mandalas, playing apples to apples, or scattergories duration of session while listening to music. LRT and pts discussed the importance of participating in leisure during their free time and when they're outside of the hospital. Pt identified two leisure interests and shared with the group.   Affect/Mood: N/A   Participation Level: Did not attend    Clinical Observations/Individualized Feedback: Ah did not attend group due to resting in their room.   Plan: Continue to engage patient in RT group sessions 2-3x/week.   Vilma Prader, LRT, CTRS 06/14/2022 11:04 AM

## 2022-06-14 NOTE — Plan of Care (Signed)
D- Patient alert and oriented. Patient presented in a pleasant mood on assessment stating that he slept good last night and had no complaints to voice to this Probation officer. Patient denied SI, HI, AVH, and pain at this time. Patient also denied any signs/symptoms of depression and anxiety stating that overall, he is doing "well, I'm doing good". Patient had no stated goals for today.  A- Scheduled medications administered to patient, per MD orders. Support and encouragement provided.  Routine safety checks conducted every 15 minutes.  Patient informed to notify staff with problems or concerns.  R- No adverse drug reactions noted. Patient contracts for safety at this time. Patient compliant with medications and treatment plan. Patient receptive, calm, and cooperative. Patient isolates to room, except for meals and medication. Patient remains safe at this time.  Problem: Education: Goal: Knowledge of General Education information will improve Description: Including pain rating scale, medication(s)/side effects and non-pharmacologic comfort measures Outcome: Progressing   Problem: Health Behavior/Discharge Planning: Goal: Ability to manage health-related needs will improve Outcome: Progressing   Problem: Clinical Measurements: Goal: Ability to maintain clinical measurements within normal limits will improve Outcome: Progressing Goal: Will remain free from infection Outcome: Progressing Goal: Diagnostic test results will improve Outcome: Progressing Goal: Respiratory complications will improve Outcome: Progressing Goal: Cardiovascular complication will be avoided Outcome: Progressing   Problem: Activity: Goal: Risk for activity intolerance will decrease Outcome: Progressing   Problem: Nutrition: Goal: Adequate nutrition will be maintained Outcome: Progressing   Problem: Coping: Goal: Level of anxiety will decrease Outcome: Progressing   Problem: Elimination: Goal: Will not experience  complications related to urinary retention Outcome: Progressing   Problem: Pain Managment: Goal: General experience of comfort will improve Outcome: Progressing   Problem: Safety: Goal: Ability to remain free from injury will improve Outcome: Progressing   Problem: Skin Integrity: Goal: Risk for impaired skin integrity will decrease Outcome: Progressing   Problem: Education: Goal: Knowledge of [place] [facility] Education information/materials will improve Outcome: Progressing Goal: Emotional status will improve Outcome: Progressing Goal: Mental status will improve Outcome: Progressing Goal: Verbalization of understanding the information provided will improve Outcome: Progressing   Problem: Activity: Goal: Interest or engagement in activities will improve Outcome: Progressing Goal: Sleeping patterns will improve Outcome: Progressing   Problem: Coping: Goal: Ability to verbalize frustrations and anger appropriately will improve Outcome: Progressing Goal: Ability to demonstrate self-control will improve Outcome: Progressing   Problem: Health Behavior/Discharge Planning: Goal: Identification of resources available to assist in meeting health care needs will improve Outcome: Progressing Goal: Compliance with treatment plan for underlying cause of condition will improve Outcome: Progressing   Problem: Physical Regulation: Goal: Ability to maintain clinical measurements within normal limits will improve Outcome: Progressing   Problem: Safety: Goal: Periods of time without injury will increase Outcome: Progressing   Problem: Activity: Goal: Will verbalize the importance of balancing activity with adequate rest periods Outcome: Progressing   Problem: Education: Goal: Will be free of psychotic symptoms Outcome: Progressing Goal: Knowledge of the prescribed therapeutic regimen will improve Outcome: Progressing   Problem: Coping: Goal: Coping ability will  improve Outcome: Progressing Goal: Will verbalize feelings Outcome: Progressing   Problem: Health Behavior/Discharge Planning: Goal: Compliance with prescribed medication regimen will improve Outcome: Progressing   Problem: Nutritional: Goal: Ability to achieve adequate nutritional intake will improve Outcome: Progressing   Problem: Role Relationship: Goal: Ability to communicate needs accurately will improve Outcome: Progressing Goal: Ability to interact with others will improve Outcome: Progressing   Problem: Safety: Goal:  Ability to redirect hostility and anger into socially appropriate behaviors will improve Outcome: Progressing Goal: Ability to remain free from injury will improve Outcome: Progressing   Problem: Self-Care: Goal: Ability to participate in self-care as condition permits will improve Outcome: Progressing   Problem: Self-Concept: Goal: Will verbalize positive feelings about self Outcome: Progressing

## 2022-06-14 NOTE — Progress Notes (Signed)
[facility] MD Progress Note  06/14/2022 2:43 PM Travis Sandoval  MRN:  [id number] Subjective: No new complaints.  No change to presentation Principal Problem: Schizophrenia ([place]) Diagnosis: Principal Problem:   Schizophrenia ([place]) Active Problems:   Psychosis ([place])  Total Time spent with patient: 30 minutes  Past Psychiatric History: Past history of schizophrenia  Past Medical History:  Past Medical History:  Diagnosis Date   ADHD    Bipolar 1 disorder ([place])    Hepatitis C    Schizoaffective disorder ([place])    History reviewed. No pertinent surgical history. Family History:  Family History  Family history unknown: Yes   Family Psychiatric  History: None Social History:  Social History   Substance and Sexual Activity  Alcohol Use Not Currently     Social History   Substance and Sexual Activity  Drug Use Not Currently    Social History   Socioeconomic History   Marital status: Unknown    Spouse name: Not on file   Number of children: Not on file   Years of education: Not on file   Highest education level: Not on file  Occupational History   Not on file  Tobacco Use   Smoking status: Every Day    Packs/day: 0.25    Years: 15.00    Total pack years: 3.75    Types: Cigarettes   Smokeless tobacco: Not on file  Vaping Use   Vaping Use: Not on file  Substance and Sexual Activity   Alcohol use: Not Currently   Drug use: Not Currently   Sexual activity: Not Currently  Other Topics Concern   Not on file  Social History Narrative   Not on file   Social Determinants of Health   Financial Resource Strain: Not on file  Food Insecurity: Unknown (05/18/2022)   Hunger Vital Sign    Worried About Running Out of Food in the Last Year: Patient refused    [place] in the Last Year: Patient refused  Transportation Needs: Unknown (05/18/2022)   [place] - Hydrologist (Medical): Patient refused    Lack of Transportation (Non-Medical): Patient  refused  Physical Activity: Not on file  Stress: Not on file  Social Connections: Not on file   Additional Social History:                         Sleep: Fair  Appetite:  Fair  Current Medications: Current Facility-Administered Medications  Medication Dose Route Frequency Provider Last Rate Last Admin   acetaminophen (TYLENOL) tablet 650 mg  650 mg Oral Q6H PRN [name], NP       alum & mag hydroxide-simeth (MAALOX/MYLANTA) 200-200-20 MG/5ML suspension 30 mL  30 mL Oral Q4H PRN [name], NP   30 mL at 05/22/22 0816   cloZAPine (CLOZARIL) tablet 250 mg  250 mg Oral QHS Corinthian Kemler T, MD   250 mg at 06/13/22 2107   docusate sodium (COLACE) capsule 100 mg  100 mg Oral BID Rhys Anchondo T, MD   100 mg at 06/14/22 0807   famotidine (PEPCID) tablet 20 mg  20 mg Oral BID Jaxyn Mestas T, MD   20 mg at 06/14/22 [id number]   hydrOXYzine (ATARAX) tablet 50 mg  50 mg Oral Q6H PRN Johnda Billiot, [name], MD   50 mg at 05/17/22 0944   magnesium citrate solution 1 Bottle  1 Bottle Oral Daily PRN Ashok Sawaya, [name], MD  1 Bottle at 06/09/22 1719   magnesium hydroxide (MILK OF MAGNESIA) suspension 30 mL  30 mL Oral Daily PRN [name], NP   30 mL at 06/08/22 0755   nicotine polacrilex (NICORETTE) gum 2 mg  2 mg Oral PRN Zaydee Aina T, MD   2 mg at 04/23/22 2200   paliperidone (INVEGA SUSTENNA) injection 156 mg  156 mg Intramuscular Q28 days Dimonique Bourdeau, [name], MD   156 mg at 05/30/22 1100   senna-docusate (Senokot-S) tablet 2 tablet  2 tablet Oral Daily PRN Tishawna Larouche, [name], MD   2 tablet at 06/07/22 2118   ziprasidone (GEODON) injection 20 mg  20 mg Intramuscular Q12H PRN [name], MD        Lab Results: No results found for this or any previous visit (from the past 48 hour(s)).  Blood Alcohol level:  Lab Results  Component Value Date   ETH <10 0000000    Metabolic Disorder Labs: Lab Results  Component Value Date   HGBA1C 5.5 04/24/2022   MPG 111 04/24/2022    No results found for: "PROLACTIN" Lab Results  Component Value Date   CHOL 220 (H) 04/24/2022   TRIG 700 (H) 04/24/2022   HDL 35 (L) 04/24/2022   CHOLHDL 6.3 04/24/2022   VLDL UNABLE TO CALCULATE IF TRIGLYCERIDE OVER 400 mg/dL 04/24/2022   LDLCALC UNABLE TO CALCULATE IF TRIGLYCERIDE OVER 400 mg/dL 04/24/2022    Physical Findings: AIMS: Facial and Oral Movements Muscles of Facial Expression: None, normal Lips and Perioral Area: None, normal Jaw: None, normal Tongue: None, normal,Extremity Movements Upper (arms, wrists, hands, fingers): None, normal Lower (legs, knees, ankles, toes): None, normal, Trunk Movements Neck, shoulders, hips: None, normal, Overall Severity Severity of abnormal movements (highest score from questions above): None, normal Incapacitation due to abnormal movements: None, normal Patient's awareness of abnormal movements (rate only patient's report): No Awareness, Dental Status Current problems with teeth and/or dentures?: No Does patient usually wear dentures?: No  CIWA:    COWS:     Musculoskeletal: Strength & Muscle Tone: within normal limits Gait & Station: normal Patient leans: N/A  Psychiatric Specialty Exam:  Presentation  General Appearance:  Disheveled  Eye Contact: Minimal  Speech: Pressured  Speech Volume: Normal  Handedness: Right   Mood and Affect  Mood: Euphoric; Irritable  Affect: Inappropriate   Thought Process  Thought Processes: Disorganized  Descriptions of Associations:Loose  Orientation:Partial  Thought Content:Illogical; Scattered  History of Schizophrenia/Schizoaffective disorder:No data recorded Duration of Psychotic Symptoms:No data recorded Hallucinations:No data recorded Ideas of Reference:Delusions; Paranoia  Suicidal Thoughts:No data recorded Homicidal Thoughts:No data recorded  Sensorium  Memory: Immediate Poor; Recent Poor; Remote  Poor  Judgment: Poor  Insight: Poor   Executive Functions  Concentration: Fair  Attention Span: Fair  Recall: Poor  Fund of Knowledge: Poor  Language: Poor   Psychomotor Activity  Psychomotor Activity:No data recorded  Assets  Assets: Desire for Improvement; Social Support   Sleep  Sleep:No data recorded   Physical Exam: Physical Exam Vitals and nursing note reviewed.  Constitutional:      Appearance: Normal appearance.  HENT:     Head: Normocephalic and atraumatic.     Mouth/Throat:     Pharynx: Oropharynx is clear.  Eyes:     Pupils: Pupils are equal, round, and reactive to light.  Cardiovascular:     Rate and Rhythm: Normal rate and regular rhythm.  Pulmonary:     Effort: Pulmonary effort is normal.  Breath sounds: Normal breath sounds.  Abdominal:     General: Abdomen is flat.     Palpations: Abdomen is soft.  Musculoskeletal:        General: Normal range of motion.  Skin:    General: Skin is warm and dry.  Neurological:     General: No focal deficit present.     Mental Status: He is alert. Mental status is at baseline.  Psychiatric:        Attention and Perception: He is inattentive.        Mood and Affect: Mood normal. Affect is blunt.        Speech: Speech is delayed.        Behavior: Behavior is slowed.        Thought Content: Thought content is delusional.    Review of Systems  Constitutional: Negative.   HENT: Negative.    Eyes: Negative.   Respiratory: Negative.    Cardiovascular: Negative.   Gastrointestinal: Negative.   Musculoskeletal: Negative.   Skin: Negative.   Neurological: Negative.   Psychiatric/Behavioral: Negative.     Blood pressure 120/80, pulse (!) 110, temperature 98 F (36.7 C), temperature source Oral, resp. rate 18, height '5\' 9"'$  (1.753 m), weight 96.4 kg, SpO2 97 %. Body mass index is 31.38 kg/m.   Treatment Plan Summary: Medication management and Plan no change to medication.  Patient continues  to appear to be completely stable with no new problems and is simply waiting on assistance with safe placement.  Alethia Berthold, MD 06/14/2022, 2:43 PM

## 2022-06-15 DIAGNOSIS — F200 Paranoid schizophrenia: Secondary | ICD-10-CM | POA: Diagnosis not present

## 2022-06-15 NOTE — Plan of Care (Signed)
D- Patient alert and oriented. Patient presented in a pleasant mood on assessment reporting that he slept good last night and had no complaints to voice to this Probation officer. Patient denied SI, HI, AVH, and pain at this time. Patient also denied any signs/symptoms of depression and anxiety, stating that overall he is feeling good. Patient had no stated goals for today.  A- Scheduled medications administered to patient, per MD orders. Support and encouragement provided.  Routine safety checks conducted every 15 minutes.  Patient informed to notify staff with problems or concerns.  R- No adverse drug reactions noted. Patient contracts for safety at this time. Patient compliant with medications. Patient receptive, calm, and cooperative. Patient isolates to room, except for meals and medication. Patient remains safe at this time.  Problem: Education: Goal: Knowledge of General Education information will improve Description: Including pain rating scale, medication(s)/side effects and non-pharmacologic comfort measures Outcome: Progressing   Problem: Health Behavior/Discharge Planning: Goal: Ability to manage health-related needs will improve Outcome: Progressing   Problem: Clinical Measurements: Goal: Ability to maintain clinical measurements within normal limits will improve Outcome: Progressing Goal: Will remain free from infection Outcome: Progressing Goal: Diagnostic test results will improve Outcome: Progressing Goal: Respiratory complications will improve Outcome: Progressing Goal: Cardiovascular complication will be avoided Outcome: Progressing   Problem: Activity: Goal: Risk for activity intolerance will decrease Outcome: Progressing   Problem: Nutrition: Goal: Adequate nutrition will be maintained Outcome: Progressing   Problem: Coping: Goal: Level of anxiety will decrease Outcome: Progressing   Problem: Elimination: Goal: Will not experience complications related to urinary  retention Outcome: Progressing   Problem: Pain Managment: Goal: General experience of comfort will improve Outcome: Progressing   Problem: Safety: Goal: Ability to remain free from injury will improve Outcome: Progressing   Problem: Skin Integrity: Goal: Risk for impaired skin integrity will decrease Outcome: Progressing   Problem: Education: Goal: Knowledge of [place] General Education information/materials will improve Outcome: Progressing Goal: Emotional status will improve Outcome: Progressing Goal: Mental status will improve Outcome: Progressing Goal: Verbalization of understanding the information provided will improve Outcome: Progressing   Problem: Activity: Goal: Interest or engagement in activities will improve Outcome: Progressing Goal: Sleeping patterns will improve Outcome: Progressing   Problem: Coping: Goal: Ability to verbalize frustrations and anger appropriately will improve Outcome: Progressing Goal: Ability to demonstrate self-control will improve Outcome: Progressing   Problem: Health Behavior/Discharge Planning: Goal: Identification of resources available to assist in meeting health care needs will improve Outcome: Progressing Goal: Compliance with treatment plan for underlying cause of condition will improve Outcome: Progressing   Problem: Physical Regulation: Goal: Ability to maintain clinical measurements within normal limits will improve Outcome: Progressing   Problem: Safety: Goal: Periods of time without injury will increase Outcome: Progressing   Problem: Activity: Goal: Will verbalize the importance of balancing activity with adequate rest periods Outcome: Progressing   Problem: Education: Goal: Will be free of psychotic symptoms Outcome: Progressing Goal: Knowledge of the prescribed therapeutic regimen will improve Outcome: Progressing   Problem: Coping: Goal: Coping ability will improve Outcome: Progressing Goal: Will  verbalize feelings Outcome: Progressing   Problem: Health Behavior/Discharge Planning: Goal: Compliance with prescribed medication regimen will improve Outcome: Progressing   Problem: Nutritional: Goal: Ability to achieve adequate nutritional intake will improve Outcome: Progressing   Problem: Role Relationship: Goal: Ability to communicate needs accurately will improve Outcome: Progressing Goal: Ability to interact with others will improve Outcome: Progressing   Problem: Safety: Goal: Ability to redirect hostility and anger  into socially appropriate behaviors will improve Outcome: Progressing Goal: Ability to remain free from injury will improve Outcome: Progressing   Problem: Self-Care: Goal: Ability to participate in self-care as condition permits will improve Outcome: Progressing   Problem: Self-Concept: Goal: Will verbalize positive feelings about self Outcome: Progressing

## 2022-06-15 NOTE — Progress Notes (Signed)
Patient alert and oriented x 4, affect is flat thoughts are organized and coherent. Patient denies SI/HI/AVH, he isolates to self in his room not interacting with peers. Patient is complaint with medication regimen.15 minutes safety checks maintained will continue to monitor.

## 2022-06-15 NOTE — Progress Notes (Signed)
[facility] MD Progress Note  06/15/2022 1:01 PM Travis Sandoval  MRN:  [id number] Subjective: Travis Sandoval is seen on rounds.  He does not have any complaints.  Nurses report no issues.  No side effects from his medications. Principal Problem: Schizophrenia ([place]) Diagnosis: Principal Problem:   Schizophrenia ([place]) Active Problems:   Psychosis ([place])  Total Time spent with patient: 15 minutes  Past Psychiatric History: Schizophrenia  Past Medical History:  Past Medical History:  Diagnosis Date   ADHD    Bipolar 1 disorder ([place])    Hepatitis C    Schizoaffective disorder ([place])    History reviewed. No pertinent surgical history. Family History:  Family History  Family history unknown: Yes   Family Psychiatric  History: Unremarkable Social History:  Social History   Substance and Sexual Activity  Alcohol Use Not Currently     Social History   Substance and Sexual Activity  Drug Use Not Currently    Social History   Socioeconomic History   Marital status: Unknown    Spouse name: Not on file   Number of children: Not on file   Years of education: Not on file   Highest education level: Not on file  Occupational History   Not on file  Tobacco Use   Smoking status: Every Day    Packs/day: 0.25    Years: 15.00    Total pack years: 3.75    Types: Cigarettes   Smokeless tobacco: Not on file  Vaping Use   Vaping Use: Not on file  Substance and Sexual Activity   Alcohol use: Not Currently   Drug use: Not Currently   Sexual activity: Not Currently  Other Topics Concern   Not on file  Social History Narrative   Not on file   Social Determinants of Health   Financial Resource Strain: Not on file  Food Insecurity: Unknown (05/18/2022)   Hunger Vital Sign    Worried About Running Out of Food in the Last Year: Patient refused    [place] in the Last Year: Patient refused  Transportation Needs: Unknown (05/18/2022)   [place] - Hydrologist  (Medical): Patient refused    Lack of Transportation (Non-Medical): Patient refused  Physical Activity: Not on file  Stress: Not on file  Social Connections: Not on file   Additional Social History:                         Sleep: Good  Appetite:  Good  Current Medications: Current Facility-Administered Medications  Medication Dose Route Frequency Provider Last Rate Last Admin   acetaminophen (TYLENOL) tablet 650 mg  650 mg Oral Q6H PRN [name], NP       alum & mag hydroxide-simeth (MAALOX/MYLANTA) 200-200-20 MG/5ML suspension 30 mL  30 mL Oral Q4H PRN [name], NP   30 mL at 05/22/22 0816   cloZAPine (CLOZARIL) tablet 250 mg  250 mg Oral QHS [name], [name], MD   250 mg at 06/14/22 2128   docusate sodium (COLACE) capsule 100 mg  100 mg Oral BID [name], [name], MD   100 mg at 06/15/22 0837   famotidine (PEPCID) tablet 20 mg  20 mg Oral BID [name], [name], MD   20 mg at 06/15/22 0837   hydrOXYzine (ATARAX) tablet 50 mg  50 mg Oral Q6H PRN [name], [name], MD   50 mg at 05/17/22 0944   magnesium citrate  solution 1 Bottle  1 Bottle Oral Daily PRN [name], [name], MD   1 Bottle at 06/14/22 1656   magnesium hydroxide (MILK OF MAGNESIA) suspension 30 mL  30 mL Oral Daily PRN [name], NP   30 mL at 06/08/22 0755   nicotine polacrilex (NICORETTE) gum 2 mg  2 mg Oral PRN [name], [name], MD   2 mg at 04/23/22 2200   paliperidone (INVEGA SUSTENNA) injection 156 mg  156 mg Intramuscular Q28 days [name], [name], MD   156 mg at 05/30/22 1100   senna-docusate (Senokot-S) tablet 2 tablet  2 tablet Oral Daily PRN [name], [name], MD   2 tablet at 06/07/22 2118   ziprasidone (GEODON) injection 20 mg  20 mg Intramuscular Q12H PRN [name], MD        Lab Results: No results found for this or any previous visit (from the past 48 hour(s)).  Blood Alcohol level:  Lab Results  Component Value Date   ETH <10 0000000    Metabolic Disorder Labs: Lab  Results  Component Value Date   HGBA1C 5.5 04/24/2022   MPG 111 04/24/2022   No results found for: "PROLACTIN" Lab Results  Component Value Date   CHOL 220 (H) 04/24/2022   TRIG 700 (H) 04/24/2022   HDL 35 (L) 04/24/2022   CHOLHDL 6.3 04/24/2022   VLDL UNABLE TO CALCULATE IF TRIGLYCERIDE OVER 400 mg/dL 04/24/2022   LDLCALC UNABLE TO CALCULATE IF TRIGLYCERIDE OVER 400 mg/dL 04/24/2022    Physical Findings: AIMS: Facial and Oral Movements Muscles of Facial Expression: None, normal Lips and Perioral Area: None, normal Jaw: None, normal Tongue: None, normal,Extremity Movements Upper (arms, wrists, hands, fingers): None, normal Lower (legs, knees, ankles, toes): None, normal, Trunk Movements Neck, shoulders, hips: None, normal, Overall Severity Severity of abnormal movements (highest score from questions above): None, normal Incapacitation due to abnormal movements: None, normal Patient's awareness of abnormal movements (rate only patient's report): No Awareness, Dental Status Current problems with teeth and/or dentures?: No Does patient usually wear dentures?: No  CIWA:    COWS:     Musculoskeletal: Strength & Muscle Tone: within normal limits Gait & Station: normal Patient leans: N/A  Psychiatric Specialty Exam:  Presentation  General Appearance:  Disheveled  Eye Contact: Minimal  Speech: Pressured  Speech Volume: Normal  Handedness: Right   Mood and Affect  Mood: Euphoric; Irritable  Affect: Inappropriate   Thought Process  Thought Processes: Disorganized  Descriptions of Associations:Loose  Orientation:Partial  Thought Content:Illogical; Scattered  History of Schizophrenia/Schizoaffective disorder:No data recorded Duration of Psychotic Symptoms:No data recorded Hallucinations:No data recorded Ideas of Reference:Delusions; Paranoia  Suicidal Thoughts:No data recorded Homicidal Thoughts:No data recorded  Sensorium  Memory: Immediate  Poor; Recent Poor; Remote Poor  Judgment: Poor  Insight: Poor   Executive Functions  Concentration: Fair  Attention Span: Fair  Recall: Poor  Fund of Knowledge: Poor  Language: Poor   Psychomotor Activity  Psychomotor Activity:No data recorded  Assets  Assets: Desire for Improvement; Social Support   Sleep  Sleep:No data recorded    Blood pressure 125/85, pulse (!) 112, temperature 97.8 F (36.6 C), temperature source Oral, resp. rate 18, height '5\' 9"'$  (1.753 m), weight 96.4 kg, SpO2 98 %. Body mass index is 31.38 kg/m.   Treatment Plan Summary: Daily contact with patient to assess and evaluate symptoms and progress in treatment, Medication management, and Plan continue current medications.  Banks, DO 06/15/2022, 1:01 PM

## 2022-06-16 DIAGNOSIS — F200 Paranoid schizophrenia: Secondary | ICD-10-CM | POA: Diagnosis not present

## 2022-06-16 NOTE — BH IP Treatment Plan (Signed)
Interdisciplinary Treatment and Diagnostic Plan Update  06/16/2022 Time of Session: 12:30pm Travis Sandoval MRN: [id number]  Principal Diagnosis: Schizophrenia [facility])  Secondary Diagnoses: Principal Problem:   Schizophrenia ([place]) Active Problems:   Psychosis ([place])   Current Medications:  Current Facility-Administered Medications  Medication Dose Route Frequency Provider Last Rate Last Admin   acetaminophen (TYLENOL) tablet 650 mg  650 mg Oral Q6H PRN [name], NP       alum & mag hydroxide-simeth (MAALOX/MYLANTA) 200-200-20 MG/5ML suspension 30 mL  30 mL Oral Q4H PRN [name], NP   30 mL at 05/22/22 0816   cloZAPine (CLOZARIL) tablet 250 mg  250 mg Oral QHS [name], [name], MD   250 mg at 06/15/22 2158   docusate sodium (COLACE) capsule 100 mg  100 mg Oral BID [name], [name], MD   100 mg at 06/16/22 0840   famotidine (PEPCID) tablet 20 mg  20 mg Oral BID [name], [name], MD   20 mg at 06/16/22 0841   hydrOXYzine (ATARAX) tablet 50 mg  50 mg Oral Q6H PRN [name], [name], MD   50 mg at 05/17/22 0944   magnesium citrate solution 1 Bottle  1 Bottle Oral Daily PRN [name], [name], MD   1 Bottle at 06/14/22 1656   magnesium hydroxide (MILK OF MAGNESIA) suspension 30 mL  30 mL Oral Daily PRN [name], NP   30 mL at 06/08/22 0755   nicotine polacrilex (NICORETTE) gum 2 mg  2 mg Oral PRN [name], [name], MD   2 mg at 04/23/22 2200   paliperidone (INVEGA SUSTENNA) injection 156 mg  156 mg Intramuscular Q28 days [name], [name], MD   156 mg at 05/30/22 1100   senna-docusate (Senokot-S) tablet 2 tablet  2 tablet Oral Daily PRN [name], [name], MD   2 tablet at 06/07/22 2118   ziprasidone (GEODON) injection 20 mg  20 mg Intramuscular Q12H PRN [name], MD       PTA Medications: Medications Prior to Admission  Medication Sig Dispense Refill Last Dose   ABILIFY MAINTENA 400 MG SRER injection Inject 400 mg into the muscle every 28 (twenty-eight) days.       ARIPiprazole (ABILIFY) 10 MG tablet Take 10 mg by mouth daily. (Patient not taking: Reported on 04/16/2022)      atomoxetine (STRATTERA) 40 MG capsule Take 40 mg by mouth daily. (Patient not taking: Reported on 04/16/2022)      atorvastatin (LIPITOR) 40 MG tablet Take 40 mg by mouth daily.      budesonide-formoterol (SYMBICORT) 160-4.5 MCG/ACT inhaler Inhale 2 puffs into the lungs.      cetirizine (ZYRTEC) 10 MG tablet Take 10 mg by mouth daily.      clozapine (CLOZARIL) 200 MG tablet Take 200 mg by mouth 2 (two) times daily. Take along with one 25 mg tablet for total 225 mg twice daily      cloZAPine (CLOZARIL) 25 MG tablet Take 25 mg by mouth 2 (two) times daily. Take along with one 200 mg tablet for total 225 mg twice daily      Dexlansoprazole (DEXILANT) 30 MG capsule Take 30 mg by mouth daily. (Patient not taking: Reported on 04/16/2022)      hydrOXYzine (VISTARIL) 50 MG capsule Take 50 mg by mouth 3 (three) times daily as needed for itching. (Patient not taking: Reported on 04/16/2022)      Melatonin 5 MG TABS Take 5 mg by mouth at bedtime as  needed (sleep).      metoprolol succinate (TOPROL-XL) 25 MG 24 hr tablet Take 12.5 mg by mouth daily.      Phosphatidyl Choline 65 MG TABS Take 1 tablet by mouth daily.  (Patient not taking: Reported on 04/16/2022)      valproic acid (DEPAKENE) 250 MG/5ML solution Take 750-1,000 mLs by mouth 2 (two) times daily. 1000 mg (20 mL) every morning and 750 mg (15 mL) daily at bedtime       Patient Stressors: Other: Psychosis    Patient Strengths: Ability for insight  Active sense of humor  Average or above average intelligence  Capable of independent living  Supportive family/friends   Treatment Modalities: Medication Management, Group therapy, Case management,  1 to 1 session with clinician, Psychoeducation, Recreational therapy.   Physician Treatment Plan for Primary Diagnosis: Schizophrenia ([place]) Long Term Goal(s):     Short Term Goals: None, pt  is a poor hx.  Medication Management: Evaluate patient's response, side effects, and tolerance of medication regimen.  Therapeutic Interventions: 1 to 1 sessions, Unit Group sessions and Medication administration.  Evaluation of Outcomes: Progressing  Physician Treatment Plan for Secondary Diagnosis: Principal Problem:   Schizophrenia ([place]) Active Problems:   Psychosis ([place])  Long Term Goal(s):     Short Term Goals: None, pt is a poor hx.     Medication Management: Evaluate patient's response, side effects, and tolerance of medication regimen.  Therapeutic Interventions: 1 to 1 sessions, Unit Group sessions and Medication administration.  Evaluation of Outcomes: Progressing   RN Treatment Plan for Primary Diagnosis: Schizophrenia ([place]) Long Term Goal(s): Knowledge of disease and therapeutic regimen to maintain health will improve  Short Term Goals: Ability to remain free from injury will improve, Ability to verbalize frustration and anger appropriately will improve, Ability to demonstrate self-control, Ability to participate in decision making will improve, Ability to identify and develop effective coping behaviors will improve, and Compliance with prescribed medications will improve  Medication Management: RN will administer medications as ordered by provider, will assess and evaluate patient's response and provide education to patient for prescribed medication. RN will report any adverse and/or side effects to prescribing provider.  Therapeutic Interventions: 1 on 1 counseling sessions, Psychoeducation, Medication administration, Evaluate responses to treatment, Monitor vital signs and CBGs as ordered, Perform/monitor CIWA, COWS, AIMS and Fall Risk screenings as ordered, Perform wound care treatments as ordered.  Evaluation of Outcomes: Progressing   LCSW Treatment Plan for Primary Diagnosis: Schizophrenia ([place]) Long Term Goal(s): Safe transition to appropriate next level of  care at discharge, Engage patient in therapeutic group addressing interpersonal concerns.  Short Term Goals: Engage patient in aftercare planning with referrals and resources, Increase social support, Increase ability to appropriately verbalize feelings, Increase emotional regulation, Identify triggers associated with mental health/substance abuse issues, and Increase skills for wellness and recovery  Therapeutic Interventions: Assess for all discharge needs, 1 to 1 time with Social worker, Explore available resources and support systems, Assess for adequacy in community support network, Educate family and significant other(s) on suicide prevention, Complete Psychosocial Assessment, Interpersonal group therapy.  Evaluation of Outcomes: Progressing   Progress in Treatment: Attending groups: No. Participating in groups: No. Taking medication as prescribed: Yes. Toleration medication: Yes. Family/Significant other contact made: Yes, individual(s) contacted:  father, Harjot Palmateer. Patient understands diagnosis: Yes. Discussing patient identified problems/goals with staff: Yes. Medical problems stabilized or resolved: Yes. Denies suicidal/homicidal ideation: Yes. Issues/concerns per patient self-inventory: No. Other: none.   New problem(s) identified: No,  Describe:  none Update 04/27/2022: No changes at this time.  Update 05/02/2022:   No changes at this time. Update 05/07/22: No changes at this time.  Update 05/12/22: No changes at this time. Update 05/17/2022: none Update 05/22/2022:  No changes at this time. Update 05/27/22: No changes at this time. Update 06/01/22: No changes at this time. Update 06/06/22: No changes at this time. Update 06/11/22: No changes at this time. Update 06/16/22: No changes at this time.    New Short Term/Long Term Goal(s): Patient to work towards elimination of symptoms of psychosis, medication management for mood stabilization; elimination of SI thoughts; development of  comprehensive mental wellness plan. Update 04/27/2022: No changes at this time.  Update 05/02/2022:   No changes at this time. Update 05/07/22: No changes at this time. Update 05/12/22: No changes at this time. Update 05/17/2022: Patient to work towards elimination of symptoms of psychosis, medication management for mood stabilization; development of comprehensive mental wellness plan.  Update 05/22/2022:  No changes at this time. Update 05/27/22: Patient to work towards detox, elimination of symptoms of psychosis, medication management for mood stabilization; elimination of SI thoughts; development of comprehensive mental wellness/sobriety plan. Update 06/01/22: No changes at this time. Update 06/06/22: No changes at this time. Update 06/11/22: No changes at this time.  Update 06/16/22: No changes at this time.    Patient Goals:  No additional goals identified at this time. Patient to continue to work towards original goals identified in initial treatment team meeting. CSW will remain available to patient should they voice additional treatment goals. Update 04/27/2022: No changes at this time.  Update 05/02/2022:   No changes at this time. Update 05/07/22: No changes at this time. Update 05/12/22: No changes at this time.  Update 05/17/2022: No additional goals identified at this time. Patient to continue to work towards original goals identified in initial treatment team meeting. CSW will remain available to patient should they voice additional treatment goals.   Update 05/22/2022:  No changes at this time. Update 05/27/22: No additional goals identified at this time. Patient to continue to work towards original goals identified in initial treatment team meeting. CSW will remain available to patient should they voice additional treatment goals. Update 06/01/22: No changes at this time. Update 06/06/22: No changes at this time. Update 06/11/22: No changes at this time. Update 06/16/22: No changes at this time.    Discharge Plan or  Barriers: No psychosocial barriers identified at this time, patient to return to place of residence when appropriate for discharge. Update 04/27/2022: No changes at this time.  Update 05/02/2022:   Treatment team has discovered that the patient is not able to return to his group home and is in need of placement.  Meeting was held with treatment team and [employer] to assist patient in identifying appropriate housing. Update 05/07/22: No changes at this time. Update 05/12/22: No changes at this time.  Update 05/17/2022: Patient lacks adequate housing/supervision. Patient high risk of inadvertent harm to self due to chronic psychotic features. CSW to continue pursuing placement. Situation ongoing, CSW will continue to monitor and update note as more information becomes available.  Update 05/22/2022:  Deloria Lair continues to work on identifying finanicial resources.  Placement continues to be sought. Update 05/27/22:  Patient lack adequate disposition/ supervision at [place]. CSW to provide case management and currently working with [facility] to mitigate funding barriers for placement at residential [facility] facility. Update 06/01/22: No changes at this time. Update 06/06/22: No changes  at this time. Update 06/11/22: No changes at this time. Update 06/16/22: No changes at this time.    Reason for Continuation of Hospitalization: Medication stabilization Other; describe psychosis    Estimated Length of Stay: 1-7 days Update 04/27/2022: No changes at this time. Update 05/02/2022:   TBD Update 05/07/22: No changes at this time. Update 05/12/22: No changes at this time.  Update 05/17/2022: 1-7 days  Update 05/22/2022:  TBD Update 05/27/22: No changes at this time. Update 06/01/22: No changes at this time. Update 06/06/22: No changes at this time. Update 06/11/22: No changes at this time. Update 06/16/22: No changes at this time.   Last 3 [place] Suicide Severity Risk Score: Flowsheet Row Admission (Current) from 04/16/2022 in [place] ED from 04/15/2022 in [facility] [place] No Risk No Risk       Last PHQ 2/9 Scores:     No data to display          Scribe for Treatment Team: Craige Cotta 06/16/2022 12:36 PM

## 2022-06-16 NOTE — BHH Group Notes (Signed)
[place] Group Notes:  (Nursing/MHT/Case Management/Adjunct)  Date:  06/16/2022  Time:  10:35 AM  Type of Therapy:   community meeting   Participation Level:  Did Not Attend    Antonieta Pert 06/16/2022, 10:35 AM

## 2022-06-16 NOTE — BHH Group Notes (Signed)
[place] Group Notes:  (Nursing/MHT/Case Management/Adjunct)  Date:  06/16/2022  Time:  10:11 PM  Type of Therapy:  Group Therapy  Participation Level:  Minimal  Participation Quality:  Appropriate and Resistant  Affect:  Appropriate and Flat  Cognitive:  Alert  Insight:  Limited  Engagement in Group:  None  Modes of Intervention:  Discussion  Summary of Progress/Problems:  Maglione,Beyonca Wisz E 06/16/2022, 10:11 PM

## 2022-06-16 NOTE — Progress Notes (Signed)
[facility] MD Progress Note  06/16/2022 1:02 PM Travis Sandoval  MRN:  [id number] Subjective:Travis Sandoval is seen on rounds.  He has no complaints.  No side effects from his medications.  Nurses report no issues. Principal Problem: Schizophrenia ([place]) Diagnosis: Principal Problem:   Schizophrenia ([place]) Active Problems:   Psychosis ([place])  Total Time spent with patient: 15 minutes  Past Psychiatric History: Schizophrenia  Past Medical History:  Past Medical History:  Diagnosis Date   ADHD    Bipolar 1 disorder ([place])    Hepatitis C    Schizoaffective disorder ([place])    History reviewed. No pertinent surgical history. Family History:  Family History  Family history unknown: Yes   Family Psychiatric  History: Unremarkable Social History:  Social History   Substance and Sexual Activity  Alcohol Use Not Currently     Social History   Substance and Sexual Activity  Drug Use Not Currently    Social History   Socioeconomic History   Marital status: Unknown    Spouse name: Not on file   Number of children: Not on file   Years of education: Not on file   Highest education level: Not on file  Occupational History   Not on file  Tobacco Use   Smoking status: Every Day    Packs/day: 0.25    Years: 15.00    Total pack years: 3.75    Types: Cigarettes   Smokeless tobacco: Not on file  Vaping Use   Vaping Use: Not on file  Substance and Sexual Activity   Alcohol use: Not Currently   Drug use: Not Currently   Sexual activity: Not Currently  Other Topics Concern   Not on file  Social History Narrative   Not on file   Social Determinants of Health   Financial Resource Strain: Not on file  Food Insecurity: Unknown (05/18/2022)   Hunger Vital Sign    Worried About Running Out of Food in the Last Year: Patient refused    [place] in the Last Year: Patient refused  Transportation Needs: Unknown (05/18/2022)   [place] - Hydrologist (Medical):  Patient refused    Lack of Transportation (Non-Medical): Patient refused  Physical Activity: Not on file  Stress: Not on file  Social Connections: Not on file   Additional Social History:                         Sleep: Good  Appetite:  Good  Current Medications: Current Facility-Administered Medications  Medication Dose Route Frequency Provider Last Rate Last Admin   acetaminophen (TYLENOL) tablet 650 mg  650 mg Oral Q6H PRN [name], NP       alum & mag hydroxide-simeth (MAALOX/MYLANTA) 200-200-20 MG/5ML suspension 30 mL  30 mL Oral Q4H PRN [name], NP   30 mL at 05/22/22 0816   cloZAPine (CLOZARIL) tablet 250 mg  250 mg Oral QHS [name], [name], MD   250 mg at 06/15/22 2158   docusate sodium (COLACE) capsule 100 mg  100 mg Oral BID [name], [name], MD   100 mg at 06/16/22 0840   famotidine (PEPCID) tablet 20 mg  20 mg Oral BID [name], [name], MD   20 mg at 06/16/22 0841   hydrOXYzine (ATARAX) tablet 50 mg  50 mg Oral Q6H PRN [name], [name], MD   50 mg at 05/17/22 0944   magnesium citrate solution 1 Bottle  1 Bottle Oral Daily PRN [name], [name], MD   1 Bottle at 06/14/22 1656   magnesium hydroxide (MILK OF MAGNESIA) suspension 30 mL  30 mL Oral Daily PRN [name], NP   30 mL at 06/08/22 0755   nicotine polacrilex (NICORETTE) gum 2 mg  2 mg Oral PRN [name], [name], MD   2 mg at 04/23/22 2200   paliperidone (INVEGA SUSTENNA) injection 156 mg  156 mg Intramuscular Q28 days [name], [name], MD   156 mg at 05/30/22 1100   senna-docusate (Senokot-S) tablet 2 tablet  2 tablet Oral Daily PRN [name], [name], MD   2 tablet at 06/07/22 2118   ziprasidone (GEODON) injection 20 mg  20 mg Intramuscular Q12H PRN [name], MD        Lab Results: No results found for this or any previous visit (from the past 48 hour(s)).  Blood Alcohol level:  Lab Results  Component Value Date   ETH <10 0000000    Metabolic Disorder Labs: Lab Results   Component Value Date   HGBA1C 5.5 04/24/2022   MPG 111 04/24/2022   No results found for: "PROLACTIN" Lab Results  Component Value Date   CHOL 220 (H) 04/24/2022   TRIG 700 (H) 04/24/2022   HDL 35 (L) 04/24/2022   CHOLHDL 6.3 04/24/2022   VLDL UNABLE TO CALCULATE IF TRIGLYCERIDE OVER 400 mg/dL 04/24/2022   LDLCALC UNABLE TO CALCULATE IF TRIGLYCERIDE OVER 400 mg/dL 04/24/2022    Physical Findings: AIMS: Facial and Oral Movements Muscles of Facial Expression: None, normal Lips and Perioral Area: None, normal Jaw: None, normal Tongue: None, normal,Extremity Movements Upper (arms, wrists, hands, fingers): None, normal Lower (legs, knees, ankles, toes): None, normal, Trunk Movements Neck, shoulders, hips: None, normal, Overall Severity Severity of abnormal movements (highest score from questions above): None, normal Incapacitation due to abnormal movements: None, normal Patient's awareness of abnormal movements (rate only patient's report): No Awareness, Dental Status Current problems with teeth and/or dentures?: No Does patient usually wear dentures?: No  CIWA:    COWS:     Musculoskeletal: Strength & Muscle Tone: within normal limits Gait & Station: normal Patient leans: N/A  Psychiatric Specialty Exam:  Presentation  General Appearance:  Disheveled  Eye Contact: Minimal  Speech: Pressured  Speech Volume: Normal  Handedness: Right   Mood and Affect  Mood: Euphoric; Irritable  Affect: Inappropriate   Thought Process  Thought Processes: Disorganized  Descriptions of Associations:Loose  Orientation:Partial  Thought Content:Illogical; Scattered  History of Schizophrenia/Schizoaffective disorder:No data recorded Duration of Psychotic Symptoms:No data recorded Hallucinations:No data recorded Ideas of Reference:Delusions; Paranoia  Suicidal Thoughts:No data recorded Homicidal Thoughts:No data recorded  Sensorium  Memory: Immediate Poor;  Recent Poor; Remote Poor  Judgment: Poor  Insight: Poor   Executive Functions  Concentration: Fair  Attention Span: Fair  Recall: Poor  Fund of Knowledge: Poor  Language: Poor   Psychomotor Activity  Psychomotor Activity:No data recorded  Assets  Assets: Desire for Improvement; Social Support   Sleep  Sleep:No data recorded    Blood pressure 109/77, pulse (!) 102, temperature 97.8 F (36.6 C), temperature source Oral, resp. rate 18, height '5\' 9"'$  (1.753 m), weight 96.4 kg, SpO2 97 %. Body mass index is 31.38 kg/m.   Treatment Plan Summary: Daily contact with patient to assess and evaluate symptoms and progress in treatment, Medication management, and Plan continue current medications.  [place], DO 06/16/2022, 1:02 PM

## 2022-06-16 NOTE — Progress Notes (Signed)
Patient alert and oriented x 4 affect is flat but brightens upon approach no distress noted, speech is coherent.15 minutes safety checks maintained will continue to monitor.

## 2022-06-16 NOTE — Plan of Care (Signed)
Patient denies SI/HI/AVH. Pt is appropriate, without complaint. Pt is isolated to self in room for the majority of day. Pt comes out to medication room for meds. Pt is out in milieu during meal time. No signs of distress or injury. Staff will continue to monitor to safety q15 mins.     Problem: Education: Goal: Knowledge of General Education information will improve Description: Including pain rating scale, medication(s)/side effects and non-pharmacologic comfort measures Outcome: Not Progressing   Problem: Health Behavior/Discharge Planning: Goal: Ability to manage health-related needs will improve Outcome: Not Progressing   Problem: Clinical Measurements: Goal: Ability to maintain clinical measurements within normal limits will improve Outcome: Not Progressing

## 2022-06-17 DIAGNOSIS — F203 Undifferentiated schizophrenia: Secondary | ICD-10-CM | POA: Diagnosis not present

## 2022-06-17 NOTE — Progress Notes (Signed)
D: Patient alert and oriented x 4, affect is flat but brightens upon approach, patient denies SI/HI/AVH. Patient is pleasant and cooperative, he appears less anxious and he is interacting with peers and staff appropriately.  A: Patient was offered support and encouragement, she was given scheduled medications and encouraged to attend groups. 15 minutes safety checks were done for safety.  R: Patient attends groups and interacts well with peers and staff. Patient is complaint with medication regimen. 15 minutes safety checks maintained will continue to monitor.

## 2022-06-17 NOTE — Group Note (Signed)
Recreation Therapy Group Note   Group Topic:Problem Solving  Group Date: 06/17/2022 Start Time: 1000 End Time: 1100 Facilitators: Leona Carry, CTRS Location:  Craft Room  Group Description: Life Boat. Patients were given the scenario that they are on a boat that is about to become shipwrecked, leaving them stranded on an [place]. They are asked to make a list of 15 different items that they want to take with them when they are stranded on the [place]. Patients are asked to rank their items from most important to least important, #1 being the most important and #15 being the least. Patients will work individually for the first round to come up with 15 items and then pair up with a peer(s) to condense their list and come up with one list of 15 items between the two of them. Patients or LRT will read aloud the 15 different items to the group after each round. LRT facilitated post-activity processing to discuss how this activity can be used in daily life post discharge.   Affect/Mood: N/A   Participation Level: Did not attend    Clinical Observations/Individualized Feedback: Travis Sandoval did not attend group due to resting in his room.   Plan: Continue to engage patient in RT group sessions 2-3x/week.   Vilma Prader, LRT, CTRS 06/17/2022 11:29 AM

## 2022-06-17 NOTE — Progress Notes (Signed)
[facility] MD Progress Note  06/17/2022 4:17 PM Travis Sandoval  MRN:  [id number] Subjective: Patient seen and chart reviewed.  No new complaints.  Continues to be delusional about his father but not hostile or threatening.  Cooperative with medicine. Principal Problem: Schizophrenia ([name]) Diagnosis: Principal Problem:   Schizophrenia ([name]) Active Problems:   Psychosis ([place])  Total Time spent with patient: 30 minutes  Past Psychiatric History: Past history of schizophrenia  Past Medical History:  Past Medical History:  Diagnosis Date   ADHD    Bipolar 1 disorder ([place])    Hepatitis C    Schizoaffective disorder ([place])    History reviewed. No pertinent surgical history. Family History:  Family History  Family history unknown: Yes   Family Psychiatric  History: See previous Social History:  Social History   Substance and Sexual Activity  Alcohol Use Not Currently     Social History   Substance and Sexual Activity  Drug Use Not Currently    Social History   Socioeconomic History   Marital status: Unknown    Spouse name: Not on file   Number of children: Not on file   Years of education: Not on file   Highest education level: Not on file  Occupational History   Not on file  Tobacco Use   Smoking status: Every Day    Packs/day: 0.25    Years: 15.00    Total pack years: 3.75    Types: Cigarettes   Smokeless tobacco: Not on file  Vaping Use   Vaping Use: Not on file  Substance and Sexual Activity   Alcohol use: Not Currently   Drug use: Not Currently   Sexual activity: Not Currently  Other Topics Concern   Not on file  Social History Narrative   Not on file   Social Determinants of Health   Financial Resource Strain: Not on file  Food Insecurity: Unknown (05/18/2022)   Hunger Vital Sign    Worried About Running Out of Food in the Last Year: Patient refused    [place] in the Last Year: Patient refused  Transportation Needs: Unknown (05/18/2022)   [place]  - Hydrologist (Medical): Patient refused    Lack of Transportation (Non-Medical): Patient refused  Physical Activity: Not on file  Stress: Not on file  Social Connections: Not on file   Additional Social History:                         Sleep: Fair  Appetite:  Fair  Current Medications: Current Facility-Administered Medications  Medication Dose Route Frequency Provider Last Rate Last Admin   acetaminophen (TYLENOL) tablet 650 mg  650 mg Oral Q6H PRN [name], NP       alum & mag hydroxide-simeth (MAALOX/MYLANTA) 200-200-20 MG/5ML suspension 30 mL  30 mL Oral Q4H PRN [name], NP   30 mL at 05/22/22 0816   cloZAPine (CLOZARIL) tablet 250 mg  250 mg Oral QHS Aspen Lawrance T, MD   250 mg at 06/16/22 2135   docusate sodium (COLACE) capsule 100 mg  100 mg Oral BID Havana Baldwin T, MD   100 mg at 06/17/22 0850   famotidine (PEPCID) tablet 20 mg  20 mg Oral BID Trimaine Maser T, MD   20 mg at 06/17/22 0850   hydrOXYzine (ATARAX) tablet 50 mg  50 mg Oral Q6H PRN Natally Ribera, [name], MD   50 mg at  05/17/22 0944   magnesium citrate solution 1 Bottle  1 Bottle Oral Daily PRN Heber Hoog T, MD   1 Bottle at 06/14/22 1656   magnesium hydroxide (MILK OF MAGNESIA) suspension 30 mL  30 mL Oral Daily PRN [name], NP   30 mL at 06/08/22 0755   nicotine polacrilex (NICORETTE) gum 2 mg  2 mg Oral PRN Kenadee Gates T, MD   2 mg at 04/23/22 2200   paliperidone (INVEGA SUSTENNA) injection 156 mg  156 mg Intramuscular Q28 days Cullin Dishman T, MD   156 mg at 05/30/22 1100   senna-docusate (Senokot-S) tablet 2 tablet  2 tablet Oral Daily PRN Seana Underwood, [name], MD   2 tablet at 06/07/22 2118   ziprasidone (GEODON) injection 20 mg  20 mg Intramuscular Q12H PRN [name], MD        Lab Results: No results found for this or any previous visit (from the past 48 hour(s)).  Blood Alcohol level:  Lab Results  Component Value Date   ETH <10  0000000    Metabolic Disorder Labs: Lab Results  Component Value Date   HGBA1C 5.5 04/24/2022   MPG 111 04/24/2022   No results found for: "PROLACTIN" Lab Results  Component Value Date   CHOL 220 (H) 04/24/2022   TRIG 700 (H) 04/24/2022   HDL 35 (L) 04/24/2022   CHOLHDL 6.3 04/24/2022   VLDL UNABLE TO CALCULATE IF TRIGLYCERIDE OVER 400 mg/dL 04/24/2022   LDLCALC UNABLE TO CALCULATE IF TRIGLYCERIDE OVER 400 mg/dL 04/24/2022    Physical Findings: AIMS: Facial and Oral Movements Muscles of Facial Expression: None, normal Lips and Perioral Area: None, normal Jaw: None, normal Tongue: None, normal,Extremity Movements Upper (arms, wrists, hands, fingers): None, normal Lower (legs, knees, ankles, toes): None, normal, Trunk Movements Neck, shoulders, hips: None, normal, Overall Severity Severity of abnormal movements (highest score from questions above): None, normal Incapacitation due to abnormal movements: None, normal Patient's awareness of abnormal movements (rate only patient's report): No Awareness, Dental Status Current problems with teeth and/or dentures?: No Does patient usually wear dentures?: No  CIWA:    COWS:     Musculoskeletal: Strength & Muscle Tone: within normal limits Gait & Station: normal Patient leans: N/A  Psychiatric Specialty Exam:  Presentation  General Appearance:  Disheveled  Eye Contact: Minimal  Speech: Pressured  Speech Volume: Normal  Handedness: Right   Mood and Affect  Mood: Euphoric; Irritable  Affect: Inappropriate   Thought Process  Thought Processes: Disorganized  Descriptions of Associations:Loose  Orientation:Partial  Thought Content:Illogical; Scattered  History of Schizophrenia/Schizoaffective disorder:No data recorded Duration of Psychotic Symptoms:No data recorded Hallucinations:No data recorded Ideas of Reference:Delusions; Paranoia  Suicidal Thoughts:No data recorded Homicidal Thoughts:No  data recorded  Sensorium  Memory: Immediate Poor; Recent Poor; Remote Poor  Judgment: Poor  Insight: Poor   Executive Functions  Concentration: Fair  Attention Span: Fair  Recall: Poor  Fund of Knowledge: Poor  Language: Poor   Psychomotor Activity  Psychomotor Activity:No data recorded  Assets  Assets: Desire for Improvement; Social Support   Sleep  Sleep:No data recorded   Physical Exam: Physical Exam Vitals reviewed.  Constitutional:      Appearance: Normal appearance.  HENT:     Head: Normocephalic and atraumatic.     Mouth/Throat:     Pharynx: Oropharynx is clear.  Eyes:     Pupils: Pupils are equal, round, and reactive to light.  Cardiovascular:     Rate and Rhythm: Normal rate  and regular rhythm.  Pulmonary:     Effort: Pulmonary effort is normal.     Breath sounds: Normal breath sounds.  Abdominal:     General: Abdomen is flat.     Palpations: Abdomen is soft.  Musculoskeletal:        General: Normal range of motion.  Skin:    General: Skin is warm and dry.  Neurological:     General: No focal deficit present.     Mental Status: He is alert. Mental status is at baseline.  Psychiatric:        Mood and Affect: Mood normal.        Thought Content: Thought content normal.    Review of Systems  Constitutional: Negative.   HENT: Negative.    Eyes: Negative.   Respiratory: Negative.    Cardiovascular: Negative.   Gastrointestinal: Negative.   Musculoskeletal: Negative.   Skin: Negative.   Neurological: Negative.   Psychiatric/Behavioral: Negative.     Blood pressure 113/74, pulse (!) 103, temperature 97.7 F (36.5 C), temperature source Oral, resp. rate 17, height '5\' 9"'$  (1.753 m), weight 96.4 kg, SpO2 97 %. Body mass index is 31.38 kg/m.   Treatment Plan Summary: Plan no change to overall treatment plan.  Continue antipsychotic medicine.  [facility] is working with him on discharge planning.  Alethia Berthold, MD 06/17/2022, 4:17  PM

## 2022-06-17 NOTE — Plan of Care (Signed)
D- Patient alert and oriented. Patient presented in a pleasant mood on assessment stating that he slept "well" last night and had no complaints to voice to this Probation officer. Patient denied any signs/symptoms of depression and anxiety, stating that overall, he is feeling "good". Although patient denied SI, HI, AVH, and pain, he has been observed by staff responding to internal stimuli. Patient had no stated goals for today.  A- Scheduled medications administered to patient, per MD orders. Support and encouragement provided.  Routine safety checks conducted every 15 minutes.  Patient informed to notify staff with problems or concerns.  R- No adverse drug reactions noted. Patient contracts for safety at this time. Patient compliant with medications. Patient receptive, calm, and cooperative. Patient isolates to room, except for meals and medication. Patient remains safe at this time.  Problem: Education: Goal: Knowledge of General Education information will improve Description: Including pain rating scale, medication(s)/side effects and non-pharmacologic comfort measures Outcome: Progressing   Problem: Health Behavior/Discharge Planning: Goal: Ability to manage health-related needs will improve Outcome: Progressing   Problem: Clinical Measurements: Goal: Ability to maintain clinical measurements within normal limits will improve Outcome: Progressing Goal: Will remain free from infection Outcome: Progressing Goal: Diagnostic test results will improve Outcome: Progressing Goal: Respiratory complications will improve Outcome: Progressing Goal: Cardiovascular complication will be avoided Outcome: Progressing   Problem: Activity: Goal: Risk for activity intolerance will decrease Outcome: Progressing   Problem: Nutrition: Goal: Adequate nutrition will be maintained Outcome: Progressing   Problem: Coping: Goal: Level of anxiety will decrease Outcome: Progressing   Problem: Elimination: Goal:  Will not experience complications related to urinary retention Outcome: Progressing   Problem: Pain Managment: Goal: General experience of comfort will improve Outcome: Progressing   Problem: Safety: Goal: Ability to remain free from injury will improve Outcome: Progressing   Problem: Skin Integrity: Goal: Risk for impaired skin integrity will decrease Outcome: Progressing   Problem: Education: Goal: Knowledge of [place] General Education information/materials will improve Outcome: Progressing Goal: Emotional status will improve Outcome: Progressing Goal: Mental status will improve Outcome: Progressing Goal: Verbalization of understanding the information provided will improve Outcome: Progressing   Problem: Activity: Goal: Interest or engagement in activities will improve Outcome: Progressing Goal: Sleeping patterns will improve Outcome: Progressing   Problem: Coping: Goal: Ability to verbalize frustrations and anger appropriately will improve Outcome: Progressing Goal: Ability to demonstrate self-control will improve Outcome: Progressing   Problem: Health Behavior/Discharge Planning: Goal: Identification of resources available to assist in meeting health care needs will improve Outcome: Progressing Goal: Compliance with treatment plan for underlying cause of condition will improve Outcome: Progressing   Problem: Physical Regulation: Goal: Ability to maintain clinical measurements within normal limits will improve Outcome: Progressing   Problem: Safety: Goal: Periods of time without injury will increase Outcome: Progressing   Problem: Activity: Goal: Will verbalize the importance of balancing activity with adequate rest periods Outcome: Progressing   Problem: Education: Goal: Will be free of psychotic symptoms Outcome: Progressing Goal: Knowledge of the prescribed therapeutic regimen will improve Outcome: Progressing   Problem: Coping: Goal: Coping  ability will improve Outcome: Progressing Goal: Will verbalize feelings Outcome: Progressing   Problem: Health Behavior/Discharge Planning: Goal: Compliance with prescribed medication regimen will improve Outcome: Progressing   Problem: Nutritional: Goal: Ability to achieve adequate nutritional intake will improve Outcome: Progressing   Problem: Role Relationship: Goal: Ability to communicate needs accurately will improve Outcome: Progressing Goal: Ability to interact with others will improve Outcome: Progressing   Problem: Safety:  Goal: Ability to redirect hostility and anger into socially appropriate behaviors will improve Outcome: Progressing Goal: Ability to remain free from injury will improve Outcome: Progressing   Problem: Self-Care: Goal: Ability to participate in self-care as condition permits will improve Outcome: Progressing   Problem: Self-Concept: Goal: Will verbalize positive feelings about self Outcome: Progressing

## 2022-06-17 NOTE — Group Note (Signed)
LCSW Group Therapy Note  Group Date: 06/17/2022 Start Time: 1300 End Time: 1400   Type of Therapy and Topic:  Group Therapy - Healthy vs Unhealthy Coping Skills  Participation Level:  Did Not Attend   Description of Group The focus of this group was to determine what unhealthy coping techniques typically are used by group members and what healthy coping techniques would be helpful in coping with various problems. Patients were guided in becoming aware of the differences between healthy and unhealthy coping techniques. Patients were asked to identify 2-3 healthy coping skills they would like to learn to use more effectively.  Therapeutic Goals Patients learned that coping is what human beings do all day long to deal with various situations in their lives Patients defined and discussed healthy vs unhealthy coping techniques Patients identified their preferred coping techniques and identified whether these were healthy or unhealthy Patients determined 2-3 healthy coping skills they would like to become more familiar with and use more often. Patients provided support and ideas to each other   Summary of Patient Progress:   Patient did not attend group despite encouraged participation.   Therapeutic Modalities Cognitive Behavioral Therapy Motivational Interviewing  Larose Kells 06/17/2022  2:37 PM

## 2022-06-18 DIAGNOSIS — F203 Undifferentiated schizophrenia: Secondary | ICD-10-CM | POA: Diagnosis not present

## 2022-06-18 MED ORDER — MAGNESIUM HYDROXIDE 400 MG/5ML PO SUSP
30.0000 mL | Freq: Every day | ORAL | Status: DC | PRN
  Administered 2022-06-24 – 2022-09-15 (×15): 30 mL via ORAL
  Filled 2022-06-18 (×16): qty 30

## 2022-06-18 MED ORDER — MAGNESIUM CITRATE PO SOLN
1.0000 | Freq: Every day | ORAL | Status: DC | PRN
  Administered 2022-06-23 – 2022-07-19 (×4): 1 via ORAL
  Filled 2022-06-18 (×4): qty 296

## 2022-06-18 NOTE — B[facility] Group Notes (Signed)
[place] Group Notes:  (Nursing/MHT/Case Management/Adjunct)  Date:  06/18/2022  Time:  9:14 AM  Type of Therapy:  Psychoeducational Skills  Participation Level:  Did Not Attend   Adela Lank [facility] 06/18/2022, 9:14 AM

## 2022-06-18 NOTE — Progress Notes (Signed)
Patient calm and pleasant during assessment denying SI/HI/AVH. Pt observed interacting appropriately with staff and peers on the unit. Pt compliant with medication administration per MD orders. Pt given education, support, and encouragement to be acitve in his treatment plan. Pt being monitored Q 15 minutes for safety per unit protocol, remains safe on the unit

## 2022-06-18 NOTE — Progress Notes (Signed)
Isolative to self and room. Minimal interaction with staff and peers. Denies SI, HI, AVH. Medication compliant.  Encouragement and support provided. Safety checks maintained. Medications given as prescribed. Pt receptive and remains safe on unit with q15 min checks.

## 2022-06-18 NOTE — Group Note (Signed)
[place] LCSW Group Therapy Note   Group Date: 06/18/2022 Start Time: 1300 End Time: 1400   Type of Therapy/Topic:  Group Therapy:  Emotion Regulation  Participation Level:  Did Not Attend   Mood:  Description of Group:    The purpose of this group is to assist patients in learning to regulate negative emotions and experience positive emotions. Patients will be guided to discuss ways in which they have been vulnerable to their negative emotions. These vulnerabilities will be juxtaposed with experiences of positive emotions or situations, and patients challenged to use positive emotions to combat negative ones. Special emphasis will be placed on coping with negative emotions in conflict situations, and patients will process healthy conflict resolution skills.  Therapeutic Goals: Patient will identify two positive emotions or experiences to reflect on in order to balance out negative emotions:  Patient will label two or more emotions that they find the most difficult to experience:  Patient will be able to demonstrate positive conflict resolution skills through discussion or role plays:   Summary of Patient Progress: X  Therapeutic Modalities:   Cognitive Behavioral Therapy Feelings Identification Dialectical Behavioral Therapy   Rozann Lesches, LCSW

## 2022-06-18 NOTE — Plan of Care (Signed)
Patient pleasant and cooperative on approach. No issues verbalized. Denies SI,HI and AVH. Appetite and energy level good. Support and encouragement given.

## 2022-06-18 NOTE — Progress Notes (Signed)
[facility] MD Progress Note  06/18/2022 2:19 PM [place]  MRN:  [id number] Subjective: Follow-up 42 year old man with schizophrenia.  Patient seen and chart reviewed.  No new complaints.  Patient is still delusional believing his father is not really his father and that he owns some real estate but he has not been changing in any of his behavior not agitated not aggressive. Principal Problem: Schizophrenia ([place]) Diagnosis: Principal Problem:   Schizophrenia ([place]) Active Problems:   Psychosis ([place])  Total Time spent with patient: 30 minutes  Past Psychiatric History: Past history of schizophrenia  Past Medical History:  Past Medical History:  Diagnosis Date   ADHD    Bipolar 1 disorder ([place])    Hepatitis C    Schizoaffective disorder ([place])    History reviewed. No pertinent surgical history. Family History:  Family History  Family history unknown: Yes   Family Psychiatric  History: See previous Social History:  Social History   Substance and Sexual Activity  Alcohol Use Not Currently     Social History   Substance and Sexual Activity  Drug Use Not Currently    Social History   Socioeconomic History   Marital status: Unknown    Spouse name: Not on file   Number of children: Not on file   Years of education: Not on file   Highest education level: Not on file  Occupational History   Not on file  Tobacco Use   Smoking status: Every Day    Packs/day: 0.25    Years: 15.00    Total pack years: 3.75    Types: Cigarettes   Smokeless tobacco: Not on file  Vaping Use   Vaping Use: Not on file  Substance and Sexual Activity   Alcohol use: Not Currently   Drug use: Not Currently   Sexual activity: Not Currently  Other Topics Concern   Not on file  Social History Narrative   Not on file   Social Determinants of Health   Financial Resource Strain: Not on file  Food Insecurity: Unknown (05/18/2022)   Hunger Vital Sign    Worried About Running Out of Food in the Last  Year: Patient refused    [place] in the Last Year: Patient refused  Transportation Needs: Unknown (05/18/2022)   [place] - Hydrologist (Medical): Patient refused    Lack of Transportation (Non-Medical): Patient refused  Physical Activity: Not on file  Stress: Not on file  Social Connections: Not on file   Additional Social History:                         Sleep: Fair  Appetite:  Fair  Current Medications: Current Facility-Administered Medications  Medication Dose Route Frequency Provider Last Rate Last Admin   acetaminophen (TYLENOL) tablet 650 mg  650 mg Oral Q6H PRN [name], NP       alum & mag hydroxide-simeth (MAALOX/MYLANTA) 200-200-20 MG/5ML suspension 30 mL  30 mL Oral Q4H PRN [name], NP   30 mL at 05/22/22 0816   cloZAPine (CLOZARIL) tablet 250 mg  250 mg Oral QHS Demetrus Pavao T, MD   250 mg at 06/17/22 2109   docusate sodium (COLACE) capsule 100 mg  100 mg Oral BID Yashira Offenberger T, MD   100 mg at 06/18/22 0754   famotidine (PEPCID) tablet 20 mg  20 mg Oral BID Darrius Montano, [name], MD   20 mg at  06/18/22 0754   hydrOXYzine (ATARAX) tablet 50 mg  50 mg Oral Q6H PRN Habiba Treloar T, MD   50 mg at 05/17/22 0944   magnesium citrate solution 1 Bottle  1 Bottle Oral Daily PRN Wynelle Cleveland, RPH       magnesium hydroxide (MILK OF MAGNESIA) suspension 30 mL  30 mL Oral Daily PRN Wynelle Cleveland, RPH       nicotine polacrilex (NICORETTE) gum 2 mg  2 mg Oral PRN Imraan Wendell T, MD   2 mg at 04/23/22 2200   paliperidone (INVEGA SUSTENNA) injection 156 mg  156 mg Intramuscular Q28 days Kenyan Karnes, [name], MD   156 mg at 05/30/22 1100   senna-docusate (Senokot-S) tablet 2 tablet  2 tablet Oral Daily PRN Marylin Lathon, [name], MD   2 tablet at 06/07/22 2118   ziprasidone (GEODON) injection 20 mg  20 mg Intramuscular Q12H PRN [name], MD        Lab Results: No results found for this or any previous visit (from the past  38 hour(s)).  Blood Alcohol level:  Lab Results  Component Value Date   ETH <10 0000000    Metabolic Disorder Labs: Lab Results  Component Value Date   HGBA1C 5.5 04/24/2022   MPG 111 04/24/2022   No results found for: "PROLACTIN" Lab Results  Component Value Date   CHOL 220 (H) 04/24/2022   TRIG 700 (H) 04/24/2022   HDL 35 (L) 04/24/2022   CHOLHDL 6.3 04/24/2022   VLDL UNABLE TO CALCULATE IF TRIGLYCERIDE OVER 400 mg/dL 04/24/2022   LDLCALC UNABLE TO CALCULATE IF TRIGLYCERIDE OVER 400 mg/dL 04/24/2022    Physical Findings: AIMS: Facial and Oral Movements Muscles of Facial Expression: None, normal Lips and Perioral Area: None, normal Jaw: None, normal Tongue: None, normal,Extremity Movements Upper (arms, wrists, hands, fingers): None, normal Lower (legs, knees, ankles, toes): None, normal, Trunk Movements Neck, shoulders, hips: None, normal, Overall Severity Severity of abnormal movements (highest score from questions above): None, normal Incapacitation due to abnormal movements: None, normal Patient's awareness of abnormal movements (rate only patient's report): No Awareness, Dental Status Current problems with teeth and/or dentures?: No Does patient usually wear dentures?: No  CIWA:    COWS:     Musculoskeletal: Strength & Muscle Tone: within normal limits Gait & Station: normal Patient leans: N/A  Psychiatric Specialty Exam:  Presentation  General Appearance:  Disheveled  Eye Contact: Minimal  Speech: Pressured  Speech Volume: Normal  Handedness: Right   Mood and Affect  Mood: Euphoric; Irritable  Affect: Inappropriate   Thought Process  Thought Processes: Disorganized  Descriptions of Associations:Loose  Orientation:Partial  Thought Content:Illogical; Scattered  History of Schizophrenia/Schizoaffective disorder:No data recorded Duration of Psychotic Symptoms:No data recorded Hallucinations:No data recorded Ideas of  Reference:Delusions; Paranoia  Suicidal Thoughts:No data recorded Homicidal Thoughts:No data recorded  Sensorium  Memory: Immediate Poor; Recent Poor; Remote Poor  Judgment: Poor  Insight: Poor   Executive Functions  Concentration: Fair  Attention Span: Fair  Recall: Poor  Fund of Knowledge: Poor  Language: Poor   Psychomotor Activity  Psychomotor Activity:No data recorded  Assets  Assets: Desire for Improvement; Social Support   Sleep  Sleep:No data recorded   Physical Exam: Physical Exam Vitals and nursing note reviewed.  Constitutional:      Appearance: Normal appearance.  HENT:     Head: Normocephalic and atraumatic.     Mouth/Throat:     Pharynx: Oropharynx is clear.  Eyes:  Pupils: Pupils are equal, round, and reactive to light.  Cardiovascular:     Rate and Rhythm: Normal rate and regular rhythm.  Pulmonary:     Effort: Pulmonary effort is normal.     Breath sounds: Normal breath sounds.  Abdominal:     General: Abdomen is flat.     Palpations: Abdomen is soft.  Musculoskeletal:        General: Normal range of motion.  Skin:    General: Skin is warm and dry.  Neurological:     General: No focal deficit present.     Mental Status: He is alert. Mental status is at baseline.  Psychiatric:        Attention and Perception: Attention normal.        Mood and Affect: Mood normal. Affect is blunt.        Speech: Speech normal.        Behavior: Behavior is cooperative.        Thought Content: Thought content is delusional.    Review of Systems  Constitutional: Negative.   HENT: Negative.    Eyes: Negative.   Respiratory: Negative.    Cardiovascular: Negative.   Gastrointestinal: Negative.   Musculoskeletal: Negative.   Skin: Negative.   Neurological: Negative.   Psychiatric/Behavioral: Negative.     Blood pressure 116/75, pulse 95, temperature 97.8 F (36.6 C), temperature source Oral, resp. rate 18, height '5\' 9"'$  (1.753 m),  weight 96.4 kg, SpO2 99 %. Body mass index is 31.38 kg/m.   Treatment Plan Summary: Medication management and Plan no change to medication management.  Supportive counseling and encouragement.  Overall treatment team is still working on placement  Alethia Berthold, MD 06/18/2022, 2:19 PM

## 2022-06-18 NOTE — Group Note (Signed)
Recreation Therapy Group Note   Group Topic:Healthy Support Systems  Group Date: 06/18/2022 Start Time: 1000 End Time: [place] Facilitators: Vilma Prader, LRT, CTRS Location:  Craft Room  Group Description: Straw Bridge. Individually, patients were given 10 plastic drinking straws and an equal length of masking tape. Using the materials provided, patients were instructed to build a free-standing bridge-like structure to suspend an everyday item (ex: puzzle box) off the floor or table surface. All materials were required to be used in Conservation officer, nature. LRT facilitated post-activity discussion reviewing how we, humans, are like the structure we built; when things get too heavy in our life and we do not have adequate supports/coping skills, then we will fall just like the straw-built structure will. LRT focused on how having a "base" or structure on the bottom was necessary for the object to stand, meaning we must be secure and stable first before building on ourselves or others. Patients were encouraged to reflect how the skills used in this activity can be generalized to daily life post discharge.  Affect/Mood: N/A   Participation Level: Did not attend    Clinical Observations/Individualized Feedback: Eivin did not attend group due to resting in his room.  Plan: Continue to engage patient in RT group sessions 2-3x/week.   Vilma Prader, LRT, CTRS 06/18/2022 11:32 AM

## 2022-06-18 NOTE — Plan of Care (Signed)
  Problem: Coping: Goal: Level of anxiety will decrease Outcome: Progressing   Problem: Pain Managment: Goal: General experience of comfort will improve Outcome: Progressing   Problem: Safety: Goal: Ability to remain free from injury will improve Outcome: Progressing   Problem: Education: Goal: Emotional status will improve Outcome: Progressing Goal: Mental status will improve Outcome: Progressing

## 2022-06-19 DIAGNOSIS — F203 Undifferentiated schizophrenia: Secondary | ICD-10-CM | POA: Diagnosis not present

## 2022-06-19 LAB — CBC WITH DIFFERENTIAL/PLATELET
Abs Immature Granulocytes: 0.03 10*3/uL (ref 0.00–0.07)
Basophils Absolute: 0 10*3/uL (ref 0.0–0.1)
Basophils Relative: 1 %
Eosinophils Absolute: 0.2 10*3/uL (ref 0.0–0.5)
Eosinophils Relative: 2 %
HCT: 43.2 % (ref 39.0–52.0)
Hemoglobin: 14.3 g/dL (ref 13.0–17.0)
Immature Granulocytes: 0 %
Lymphocytes Relative: 41 %
Lymphs Abs: 3.5 10*3/uL (ref 0.7–4.0)
MCH: 29.7 pg (ref 26.0–34.0)
MCHC: 33.1 g/dL (ref 30.0–36.0)
MCV: 89.6 fL (ref 80.0–100.0)
Monocytes Absolute: 0.6 10*3/uL (ref 0.1–1.0)
Monocytes Relative: 8 %
Neutro Abs: 4.1 10*3/uL (ref 1.7–7.7)
Neutrophils Relative %: 48 %
Platelets: 245 10*3/uL (ref 150–400)
RBC: 4.82 MIL/uL (ref 4.22–5.81)
RDW: 12.1 % (ref 11.5–15.5)
WBC: 8.5 10*3/uL (ref 4.0–10.5)
nRBC: 0 % (ref 0.0–0.2)

## 2022-06-19 NOTE — Progress Notes (Signed)
[facility] MD Progress Note  06/19/2022 2:42 PM Travis Sandoval  MRN:  [id number] Subjective: Follow-up 42 year old man with schizophrenia.  No new complaints no change in behavior.  Stays in room most of the time.  Has delusions if you talk to him about it but does not act on them. Principal Problem: Schizophrenia ([place]) Diagnosis: Principal Problem:   Schizophrenia ([place]) Active Problems:   Psychosis ([place])  Total Time spent with patient: 30 minutes  Past Psychiatric History: Past history of schizophrenia  Past Medical History:  Past Medical History:  Diagnosis Date   ADHD    Bipolar 1 disorder ([place])    Hepatitis C    Schizoaffective disorder ([place])    History reviewed. No pertinent surgical history. Family History:  Family History  Family history unknown: Yes   Family Psychiatric  History: See previous Social History:  Social History   Substance and Sexual Activity  Alcohol Use Not Currently     Social History   Substance and Sexual Activity  Drug Use Not Currently    Social History   Socioeconomic History   Marital status: Unknown    Spouse name: Not on file   Number of children: Not on file   Years of education: Not on file   Highest education level: Not on file  Occupational History   Not on file  Tobacco Use   Smoking status: Every Day    Packs/day: 0.25    Years: 15.00    Total pack years: 3.75    Types: Cigarettes   Smokeless tobacco: Not on file  Vaping Use   Vaping Use: Not on file  Substance and Sexual Activity   Alcohol use: Not Currently   Drug use: Not Currently   Sexual activity: Not Currently  Other Topics Concern   Not on file  Social History Narrative   Not on file   Social Determinants of Health   Financial Resource Strain: Not on file  Food Insecurity: Unknown (05/18/2022)   Hunger Vital Sign    Worried About Running Out of Food in the Last Year: Patient refused    [place] in the Last Year: Patient refused  Transportation Needs:  Unknown (05/18/2022)   [place] - Hydrologist (Medical): Patient refused    Lack of Transportation (Non-Medical): Patient refused  Physical Activity: Not on file  Stress: Not on file  Social Connections: Not on file   Additional Social History:                         Sleep: Fair  Appetite:  Fair  Current Medications: Current Facility-Administered Medications  Medication Dose Route Frequency Provider Last Rate Last Admin   acetaminophen (TYLENOL) tablet 650 mg  650 mg Oral Q6H PRN [name], NP       alum & mag hydroxide-simeth (MAALOX/MYLANTA) 200-200-20 MG/5ML suspension 30 mL  30 mL Oral Q4H PRN [name], NP   30 mL at 05/22/22 0816   cloZAPine (CLOZARIL) tablet 250 mg  250 mg Oral QHS Telsa Dillavou T, MD   250 mg at 06/18/22 2109   docusate sodium (COLACE) capsule 100 mg  100 mg Oral BID Diaz Crago T, MD   100 mg at 06/19/22 0759   famotidine (PEPCID) tablet 20 mg  20 mg Oral BID Larrie Lucia T, MD   20 mg at 06/19/22 0759   hydrOXYzine (ATARAX) tablet 50 mg  50 mg Oral  Q6H PRN Lateef Juncaj, [name], MD   50 mg at 05/17/22 0944   magnesium citrate solution 1 Bottle  1 Bottle Oral Daily PRN Wynelle Cleveland, RPH       magnesium hydroxide (MILK OF MAGNESIA) suspension 30 mL  30 mL Oral Daily PRN Wynelle Cleveland, RPH       nicotine polacrilex (NICORETTE) gum 2 mg  2 mg Oral PRN Inaki Vantine, [name], MD   2 mg at 04/23/22 2200   paliperidone (INVEGA SUSTENNA) injection 156 mg  156 mg Intramuscular Q28 days Indie Nickerson, [name], MD   156 mg at 05/30/22 1100   senna-docusate (Senokot-S) tablet 2 tablet  2 tablet Oral Daily PRN Ashleigh Arya, [name], MD   2 tablet at 06/07/22 2118   ziprasidone (GEODON) injection 20 mg  20 mg Intramuscular Q12H PRN [name], MD        Lab Results:  Results for orders placed or performed during the hospital encounter of 04/16/22 (from the past 48 hour(s))  CBC with Differential/Platelet     Status: None    Collection Time: 06/19/22  6:21 AM  Result Value Ref Range   WBC 8.5 4.0 - 10.5 K/uL   RBC 4.82 4.22 - 5.81 MIL/uL   Hemoglobin 14.3 13.0 - 17.0 g/dL   HCT 43.2 39.0 - 52.0 %   MCV 89.6 80.0 - 100.0 fL   MCH 29.7 26.0 - 34.0 pg   MCHC 33.1 30.0 - 36.0 g/dL   RDW 12.1 11.5 - 15.5 %   Platelets 245 150 - 400 K/uL   nRBC 0.0 0.0 - 0.2 %   Neutrophils Relative % 48 %   Neutro Abs 4.1 1.7 - 7.7 K/uL   Lymphocytes Relative 41 %   Lymphs Abs 3.5 0.7 - 4.0 K/uL   Monocytes Relative 8 %   Monocytes Absolute 0.6 0.1 - 1.0 K/uL   Eosinophils Relative 2 %   Eosinophils Absolute 0.2 0.0 - 0.5 K/uL   Basophils Relative 1 %   Basophils Absolute 0.0 0.0 - 0.1 K/uL   Immature Granulocytes 0 %   Abs Immature Granulocytes 0.03 0.00 - 0.07 K/uL    Comment: Performed at [facility], [address] [place], [place] [address]    Blood Alcohol level:  Lab Results  Component Value Date   [facility] Srvcs <10 0000000    Metabolic Disorder Labs: Lab Results  Component Value Date   HGBA1C 5.5 04/24/2022   MPG 111 04/24/2022   No results found for: "PROLACTIN" Lab Results  Component Value Date   CHOL 220 (H) 04/24/2022   TRIG 700 (H) 04/24/2022   HDL 35 (L) 04/24/2022   CHOLHDL 6.3 04/24/2022   VLDL UNABLE TO CALCULATE IF TRIGLYCERIDE OVER 400 mg/dL 04/24/2022   LDLCALC UNABLE TO CALCULATE IF TRIGLYCERIDE OVER 400 mg/dL 04/24/2022    Physical Findings: AIMS: Facial and Oral Movements Muscles of Facial Expression: None, normal Lips and Perioral Area: None, normal Jaw: None, normal Tongue: None, normal,Extremity Movements Upper (arms, wrists, hands, fingers): None, normal Lower (legs, knees, ankles, toes): None, normal, Trunk Movements Neck, shoulders, hips: None, normal, Overall Severity Severity of abnormal movements (highest score from questions above): None, normal Incapacitation due to abnormal movements: None, normal Patient's awareness of abnormal movements (rate only patient's  report): No Awareness, Dental Status Current problems with teeth and/or dentures?: No Does patient usually wear dentures?: No  CIWA:    COWS:     Musculoskeletal: Strength & Muscle Tone: within normal  limits Gait & Station: normal Patient leans: N/A  Psychiatric Specialty Exam:  Presentation  General Appearance:  Disheveled  Eye Contact: Minimal  Speech: Pressured  Speech Volume: Normal  Handedness: Right   Mood and Affect  Mood: Euphoric; Irritable  Affect: Inappropriate   Thought Process  Thought Processes: Disorganized  Descriptions of Associations:Loose  Orientation:Partial  Thought Content:Illogical; Scattered  History of Schizophrenia/Schizoaffective disorder:No data recorded Duration of Psychotic Symptoms:No data recorded Hallucinations:No data recorded Ideas of Reference:Delusions; Paranoia  Suicidal Thoughts:No data recorded Homicidal Thoughts:No data recorded  Sensorium  Memory: Immediate Poor; Recent Poor; Remote Poor  Judgment: Poor  Insight: Poor   Executive Functions  Concentration: Fair  Attention Span: Fair  Recall: Poor  Fund of Knowledge: Poor  Language: Poor   Psychomotor Activity  Psychomotor Activity:No data recorded  Assets  Assets: Desire for Improvement; Social Support   Sleep  Sleep:No data recorded   Physical Exam: Physical Exam Vitals and nursing note reviewed.  Constitutional:      Appearance: Normal appearance.  HENT:     Head: Normocephalic and atraumatic.     Mouth/Throat:     Pharynx: Oropharynx is clear.  Eyes:     Pupils: Pupils are equal, round, and reactive to light.  Cardiovascular:     Rate and Rhythm: Normal rate and regular rhythm.  Pulmonary:     Effort: Pulmonary effort is normal.     Breath sounds: Normal breath sounds.  Abdominal:     General: Abdomen is flat.     Palpations: Abdomen is soft.  Musculoskeletal:        General: Normal range of motion.  Skin:     General: Skin is warm and dry.  Neurological:     General: No focal deficit present.     Mental Status: He is alert. Mental status is at baseline.  Psychiatric:        Attention and Perception: He is inattentive.        Mood and Affect: Mood normal. Affect is blunt.        Speech: Speech is delayed.        Behavior: Behavior is slowed.        Thought Content: Thought content is delusional.    Review of Systems  Constitutional: Negative.   HENT: Negative.    Eyes: Negative.   Respiratory: Negative.    Cardiovascular: Negative.   Gastrointestinal: Negative.   Musculoskeletal: Negative.   Skin: Negative.   Neurological: Negative.   Psychiatric/Behavioral: Negative.     Blood pressure 109/78, pulse (!) 109, temperature 97.6 F (36.4 C), temperature source Oral, resp. rate 18, height '5\' 9"'$  (1.753 m), weight 96.4 kg, SpO2 96 %. Body mass index is 31.38 kg/m.   Treatment Plan Summary: Plan no change to medication no change to treatment plan.  Continue to focus on placement.  Alethia Berthold, MD 06/19/2022, 2:42 PM

## 2022-06-19 NOTE — Group Note (Signed)
Recreation Therapy Group Note   Group Topic:Leisure Education  Group Date: 06/19/2022 Start Time: 1000 End Time: 1050 Facilitators: Vilma Prader, LRT, CTRS Location:  Craft Room  Group Description: Leisure. Patients were given the option to choose from coloring mandalas, playing apples to apples, or making origami duration of session while listening to music. LRT and pts discussed the importance of participating in leisure during their free time and when they're outside of the hospital. Pt identified two leisure interests and shared with the group.   Affect/Mood: N/A   Participation Level: Did not attend    Clinical Observations/Individualized Feedback: Micharl did not attend group due to resting in his room.  Plan: Continue to engage patient in RT group sessions 2-3x/week.   Vilma Prader, LRT, CTRS 06/19/2022 11:12 AM

## 2022-06-19 NOTE — Group Note (Signed)
LCSW Group Therapy Note  Group Date: 06/19/2022 Start Time: 1300 End Time: 1400   Type of Therapy and Topic:  Group Therapy - How To Cope with Nervousness about Discharge   Participation Level:  Did Not Attend   Description of Group This process group involved identification of patients' feelings about discharge. Some of them are scheduled to be discharged soon, while others are new admissions, but each of them was asked to share thoughts and feelings surrounding discharge from the hospital. One common theme was that they are excited at the prospect of going home, while another was that many of them are apprehensive about sharing why they were hospitalized. Patients were given the opportunity to discuss these feelings with their peers in preparation for discharge.  Therapeutic Goals  Patient will identify their overall feelings about pending discharge. Patient will think about how they might proactively address issues that they believe will once again arise once they get home (i.e. with parents). Patients will participate in discussion about having hope for change.   Summary of Patient Progress:   Patient did not attend group despite encouraged participation.    Therapeutic Modalities Cognitive Behavioral Therapy   Larose Kells 06/19/2022  3:01 PM

## 2022-06-19 NOTE — Consult Note (Signed)
Pharmacy Consult - Clozapine     42 yo male ordered clozapine 100 mg PO daily  This patient's order has been reviewed for prescribing contraindications.    Clozapine REMS enrollment Verified: yes on 04/17/22 REMS patient ID: [id number] Current Outpatient Monitoring: Every 2 weeks   Home Regimen:  225 mg po BID Last dose: unknown   Dose Adjustments This Admission: Dose started at clozapine 100 mg po daily Dose is currently clozapine 250 mg po HS (started 1/22)   Labs: Date    ANC    Submitted? 12/27 2800 Yes 01/03 3500 Yes 01/10   3100 Yes 01/24 3000 Yes 01/31 4400 yes   02/07 4300 yes 02/14 4500 Yes 02/21 5500 Yes 02/28 4080 Yes  Plan: Continue clozapine 250 mg po HS Monitor ANC at least weekly while inpatient    Abimelec Grochowski Rodriguez-Guzman PharmD, BCPS 06/19/2022 10:18 AM

## 2022-06-19 NOTE — Group Note (Unsigned)
Date:  06/19/2022 Time:  11:45 PM  Group Topic/Focus:  Personal Choices and Values:   The focus of this group is to help patients assess and explore the importance of values in their lives, how their values affect their decisions, how they express their values and what opposes their expression.     Participation Level:  {BHH PARTICIPATION [id number]  Participation Quality:  {BHH PARTICIPATION QUALITY:22265}  Affect:  {BHH AFFECT:22266}  Cognitive:  {BHH COGNITIVE:22267}  Insight: {BHH Insight2:20797}  Engagement in Group:  {BHH ENGAGEMENT IN BP:[id number]  Modes of Intervention:  {BHH MODES OF INTERVENTION:22269}  Additional Comments:  ***  Lenord Fralix 06/19/2022, 11:45 PM

## 2022-06-19 NOTE — Progress Notes (Signed)
Patient calm and pleasant during assessment denying SI/HI/AVH. Pt observed interacting appropriately with staff and peers on the unit. Pt compliant with medication administration per MD orders. Pt given education, support, and encouragement to be acitve in his treatment plan. Pt being monitored Q 15 minutes for safety per unit protocol, remains safe on the unit

## 2022-06-19 NOTE — Plan of Care (Signed)
D- Patient alert and oriented. Patient presented in a pleasant mood on assessment reporting that he slept good last night and had no complaints to voice to this Probation officer. Patient denied SI, HI, AVH, and pain at this time. Patient also denied any signs/symptoms of depression and anxiety, stating to this writer that overall, he is feeling "good". Patient had no stated goals for today.  A- Scheduled medications administered to patient, per MD orders. Support and encouragement provided.  Routine safety checks conducted every 15 minutes.  Patient informed to notify staff with problems or concerns.  R- No adverse drug reactions noted. Patient contracts for safety at this time. Patient compliant with medications. Patient receptive, calm, and cooperative. Patient isolates to room, except for meals and medication. Patient remains safe at this time.  Problem: Education: Goal: Knowledge of General Education information will improve Description: Including pain rating scale, medication(s)/side effects and non-pharmacologic comfort measures Outcome: Progressing   Problem: Health Behavior/Discharge Planning: Goal: Ability to manage health-related needs will improve Outcome: Progressing   Problem: Clinical Measurements: Goal: Ability to maintain clinical measurements within normal limits will improve Outcome: Progressing Goal: Will remain free from infection Outcome: Progressing Goal: Diagnostic test results will improve Outcome: Progressing Goal: Respiratory complications will improve Outcome: Progressing Goal: Cardiovascular complication will be avoided Outcome: Progressing   Problem: Activity: Goal: Risk for activity intolerance will decrease Outcome: Progressing   Problem: Nutrition: Goal: Adequate nutrition will be maintained Outcome: Progressing   Problem: Coping: Goal: Level of anxiety will decrease Outcome: Progressing   Problem: Elimination: Goal: Will not experience complications  related to urinary retention Outcome: Progressing   Problem: Pain Managment: Goal: General experience of comfort will improve Outcome: Progressing   Problem: Safety: Goal: Ability to remain free from injury will improve Outcome: Progressing   Problem: Skin Integrity: Goal: Risk for impaired skin integrity will decrease Outcome: Progressing   Problem: Education: Goal: Knowledge of [place] [facility] Education information/materials will improve Outcome: Progressing Goal: Emotional status will improve Outcome: Progressing Goal: Mental status will improve Outcome: Progressing Goal: Verbalization of understanding the information provided will improve Outcome: Progressing   Problem: Activity: Goal: Interest or engagement in activities will improve Outcome: Progressing Goal: Sleeping patterns will improve Outcome: Progressing   Problem: Coping: Goal: Ability to verbalize frustrations and anger appropriately will improve Outcome: Progressing Goal: Ability to demonstrate self-control will improve Outcome: Progressing   Problem: Health Behavior/Discharge Planning: Goal: Identification of resources available to assist in meeting health care needs will improve Outcome: Progressing Goal: Compliance with treatment plan for underlying cause of condition will improve Outcome: Progressing   Problem: Physical Regulation: Goal: Ability to maintain clinical measurements within normal limits will improve Outcome: Progressing   Problem: Safety: Goal: Periods of time without injury will increase Outcome: Progressing   Problem: Activity: Goal: Will verbalize the importance of balancing activity with adequate rest periods Outcome: Progressing   Problem: Education: Goal: Will be free of psychotic symptoms Outcome: Progressing Goal: Knowledge of the prescribed therapeutic regimen will improve Outcome: Progressing   Problem: Coping: Goal: Coping ability will improve Outcome:  Progressing Goal: Will verbalize feelings Outcome: Progressing   Problem: Health Behavior/Discharge Planning: Goal: Compliance with prescribed medication regimen will improve Outcome: Progressing   Problem: Nutritional: Goal: Ability to achieve adequate nutritional intake will improve Outcome: Progressing   Problem: Role Relationship: Goal: Ability to communicate needs accurately will improve Outcome: Progressing Goal: Ability to interact with others will improve Outcome: Progressing   Problem: Safety: Goal: Ability to redirect  hostility and anger into socially appropriate behaviors will improve Outcome: Progressing Goal: Ability to remain free from injury will improve Outcome: Progressing   Problem: Self-Care: Goal: Ability to participate in self-care as condition permits will improve Outcome: Progressing   Problem: Self-Concept: Goal: Will verbalize positive feelings about self Outcome: Progressing

## 2022-06-19 NOTE — Progress Notes (Signed)
[facility]/BMU/FBC LCSW Progress Note   06/19/2022    3:03 PM  Travis Sandoval   [id number]   Type of Contact and Topic: Care Coordination   CSW assisted patient in changing address listed with Social Security. Address changed to [place], [place] [address]    Signed:  Durenda Hurt, MSW, LCSW, LCAS 06/19/2022 3:03 PM

## 2022-06-20 DIAGNOSIS — F203 Undifferentiated schizophrenia: Secondary | ICD-10-CM | POA: Diagnosis not present

## 2022-06-20 NOTE — Plan of Care (Signed)
Patient verbalized no issues. Denies SI,HI and AVH. Appetite and energy level good. ADLs maintained. Support and encouragement given.

## 2022-06-20 NOTE — [facility] Group Notes (Signed)
[facility] Group Notes:  (Nursing/MHT/Case Management/Adjunct)  Date:  06/20/2022  Time:  9:17 AM  Type of Therapy:   community meeting  Participation Level:  Did Not Attend    Antonieta Pert 06/20/2022, 9:17 AM

## 2022-06-20 NOTE — Progress Notes (Signed)
Patient calm and pleasant during assessment denying SI/HI/AVH. Pt observed interacting appropriately with staff and peers on the unit. Pt compliant with medication administration per MD orders. Pt given education, support, and encouragement to be acitve in his treatment plan. Pt being monitored Q 15 minutes for safety per unit protocol, remains safe on the unit

## 2022-06-20 NOTE — Progress Notes (Signed)
[facility] MD Progress Note  06/20/2022 2:55 PM Travis Sandoval  MRN:  [id number] Subjective: Follow-up patient with schizophrenia.  No new complaints no change to behavior Principal Problem: Schizophrenia ([place]) Diagnosis: Principal Problem:   Schizophrenia ([place]) Active Problems:   Psychosis ([place])  Total Time spent with patient: 20 minutes  Past Psychiatric History: Past history of schizophrenia  Past Medical History:  Past Medical History:  Diagnosis Date   ADHD    Bipolar 1 disorder ([place])    Hepatitis C    Schizoaffective disorder ([place])    History reviewed. No pertinent surgical history. Family History:  Family History  Family history unknown: Yes   Family Psychiatric  History: See previous Social History:  Social History   Substance and Sexual Activity  Alcohol Use Not Currently     Social History   Substance and Sexual Activity  Drug Use Not Currently    Social History   Socioeconomic History   Marital status: Unknown    Spouse name: Not on file   Number of children: Not on file   Years of education: Not on file   Highest education level: Not on file  Occupational History   Not on file  Tobacco Use   Smoking status: Every Day    Packs/day: 0.25    Years: 15.00    Total pack years: 3.75    Types: Cigarettes   Smokeless tobacco: Not on file  Vaping Use   Vaping Use: Not on file  Substance and Sexual Activity   Alcohol use: Not Currently   Drug use: Not Currently   Sexual activity: Not Currently  Other Topics Concern   Not on file  Social History Narrative   Not on file   Social Determinants of Health   Financial Resource Strain: Not on file  Food Insecurity: Unknown (05/18/2022)   Hunger Vital Sign    Worried About Running Out of Food in the Last Year: Patient refused    [place] in the Last Year: Patient refused  Transportation Needs: Unknown (05/18/2022)   [place] - Hydrologist (Medical): Patient refused    Lack  of Transportation (Non-Medical): Patient refused  Physical Activity: Not on file  Stress: Not on file  Social Connections: Not on file   Additional Social History:                         Sleep: Fair  Appetite:  Negative  Current Medications: Current Facility-Administered Medications  Medication Dose Route Frequency Provider Last Rate Last Admin   acetaminophen (TYLENOL) tablet 650 mg  650 mg Oral Q6H PRN [name], NP       alum & mag hydroxide-simeth (MAALOX/MYLANTA) 200-200-20 MG/5ML suspension 30 mL  30 mL Oral Q4H PRN [name], NP   30 mL at 05/22/22 0816   cloZAPine (CLOZARIL) tablet 250 mg  250 mg Oral QHS Shauntea Lok T, MD   250 mg at 06/19/22 2134   docusate sodium (COLACE) capsule 100 mg  100 mg Oral BID Emmanuella Mirante T, MD   100 mg at 06/20/22 0748   famotidine (PEPCID) tablet 20 mg  20 mg Oral BID Aasha Dina T, MD   20 mg at 06/20/22 0748   hydrOXYzine (ATARAX) tablet 50 mg  50 mg Oral Q6H PRN Brinlyn Cena, [name], MD   50 mg at 05/17/22 0944   magnesium citrate solution 1 Bottle  1 Bottle Oral Daily  PRN Wynelle Cleveland, RPH       magnesium hydroxide (MILK OF MAGNESIA) suspension 30 mL  30 mL Oral Daily PRN Wynelle Cleveland, RPH       nicotine polacrilex (NICORETTE) gum 2 mg  2 mg Oral PRN Madyn Ivins, [name], MD   2 mg at 04/23/22 2200   paliperidone (INVEGA SUSTENNA) injection 156 mg  156 mg Intramuscular Q28 days Mell Guia, [name], MD   156 mg at 05/30/22 1100   senna-docusate (Senokot-S) tablet 2 tablet  2 tablet Oral Daily PRN Dezmond Downie, [name], MD   2 tablet at 06/19/22 1651   ziprasidone (GEODON) injection 20 mg  20 mg Intramuscular Q12H PRN [name], MD        Lab Results:  Results for orders placed or performed during the hospital encounter of 04/16/22 (from the past 48 hour(s))  CBC with Differential/Platelet     Status: None   Collection Time: 06/19/22  6:21 AM  Result Value Ref Range   WBC 8.5 4.0 - 10.5 K/uL   RBC 4.82 4.22 -  5.81 MIL/uL   Hemoglobin 14.3 13.0 - 17.0 g/dL   HCT 43.2 39.0 - 52.0 %   MCV 89.6 80.0 - 100.0 fL   MCH 29.7 26.0 - 34.0 pg   MCHC 33.1 30.0 - 36.0 g/dL   RDW 12.1 11.5 - 15.5 %   Platelets 245 150 - 400 K/uL   nRBC 0.0 0.0 - 0.2 %   Neutrophils Relative % 48 %   Neutro Abs 4.1 1.7 - 7.7 K/uL   Lymphocytes Relative 41 %   Lymphs Abs 3.5 0.7 - 4.0 K/uL   Monocytes Relative 8 %   Monocytes Absolute 0.6 0.1 - 1.0 K/uL   Eosinophils Relative 2 %   Eosinophils Absolute 0.2 0.0 - 0.5 K/uL   Basophils Relative 1 %   Basophils Absolute 0.0 0.0 - 0.1 K/uL   Immature Granulocytes 0 %   Abs Immature Granulocytes 0.03 0.00 - 0.07 K/uL    Comment: Performed at [facility], [address] [place], [place] [address]    Blood Alcohol level:  Lab Results  Component Value Date   [facility] <10 0000000    Metabolic Disorder Labs: Lab Results  Component Value Date   HGBA1C 5.5 04/24/2022   MPG 111 04/24/2022   No results found for: "PROLACTIN" Lab Results  Component Value Date   CHOL 220 (H) 04/24/2022   TRIG 700 (H) 04/24/2022   HDL 35 (L) 04/24/2022   CHOLHDL 6.3 04/24/2022   VLDL UNABLE TO CALCULATE IF TRIGLYCERIDE OVER 400 mg/dL 04/24/2022   LDLCALC UNABLE TO CALCULATE IF TRIGLYCERIDE OVER 400 mg/dL 04/24/2022    Physical Findings: AIMS: Facial and Oral Movements Muscles of Facial Expression: None, normal Lips and Perioral Area: None, normal Jaw: None, normal Tongue: None, normal,Extremity Movements Upper (arms, wrists, hands, fingers): None, normal Lower (legs, knees, ankles, toes): None, normal, Trunk Movements Neck, shoulders, hips: None, normal, Overall Severity Severity of abnormal movements (highest score from questions above): None, normal Incapacitation due to abnormal movements: None, normal Patient's awareness of abnormal movements (rate only patient's report): No Awareness, Dental Status Current problems with teeth and/or dentures?: No Does patient  usually wear dentures?: No  CIWA:    COWS:     Musculoskeletal: Strength & Muscle Tone: within normal limits Gait & Station: normal Patient leans: N/A  Psychiatric Specialty Exam:  Presentation  General Appearance:  Disheveled  Eye Contact: Minimal  Speech:  Pressured  Speech Volume: Normal  Handedness: Right   Mood and Affect  Mood: Euphoric; Irritable  Affect: Inappropriate   Thought Process  Thought Processes: Disorganized  Descriptions of Associations:Loose  Orientation:Partial  Thought Content:Illogical; Scattered  History of Schizophrenia/Schizoaffective disorder:No data recorded Duration of Psychotic Symptoms:No data recorded Hallucinations:No data recorded Ideas of Reference:Delusions; Paranoia  Suicidal Thoughts:No data recorded Homicidal Thoughts:No data recorded  Sensorium  Memory: Immediate Poor; Recent Poor; Remote Poor  Judgment: Poor  Insight: Poor   Executive Functions  Concentration: Fair  Attention Span: Fair  Recall: Poor  Fund of Knowledge: Poor  Language: Poor   Psychomotor Activity  Psychomotor Activity:No data recorded  Assets  Assets: Desire for Improvement; Social Support   Sleep  Sleep:No data recorded   Physical Exam: Physical Exam Vitals and nursing note reviewed.  Constitutional:      Appearance: Normal appearance.  HENT:     Head: Normocephalic and atraumatic.     Mouth/Throat:     Pharynx: Oropharynx is clear.  Eyes:     Pupils: Pupils are equal, round, and reactive to light.  Cardiovascular:     Rate and Rhythm: Normal rate and regular rhythm.  Pulmonary:     Effort: Pulmonary effort is normal.     Breath sounds: Normal breath sounds.  Abdominal:     General: Abdomen is flat.     Palpations: Abdomen is soft.  Musculoskeletal:        General: Normal range of motion.  Skin:    General: Skin is warm and dry.  Neurological:     General: No focal deficit present.     Mental  Status: He is alert. Mental status is at baseline.  Psychiatric:        Attention and Perception: Attention normal.        Mood and Affect: Mood normal. Affect is blunt.        Speech: Speech is delayed.        Behavior: Behavior is slowed.        Thought Content: Thought content is paranoid.        Cognition and Memory: Cognition is impaired.    Review of Systems  Constitutional: Negative.   HENT: Negative.    Eyes: Negative.   Respiratory: Negative.    Cardiovascular: Negative.   Gastrointestinal: Negative.   Musculoskeletal: Negative.   Skin: Negative.   Neurological: Negative.   Psychiatric/Behavioral: Negative.     Blood pressure 121/83, pulse 95, temperature 98.1 F (36.7 C), temperature source Oral, resp. rate 18, height '5\' 9"'$  (1.753 m), weight 96.4 kg, SpO2 98 %. Body mass index is 31.38 kg/m.   Treatment Plan Summary: Medication management and Plan no change to treatment.  Continue current medicine.  Encourage patient to be more interactive with team.  Still working on discharge [place], MD 06/20/2022, 2:55 PM

## 2022-06-20 NOTE — BHH Group Notes (Signed)
[place] Group Notes:  (Nursing/MHT/Case Management/Adjunct)  Date:  06/20/2022  Time:  4:46 PM  Type of Therapy:  Psychoeducational Skills  Participation Level:  Did Not Attend   Adela Lank [facility] 06/20/2022, 4:46 PM

## 2022-06-20 NOTE — Group Note (Signed)
Recreation Therapy Group Note   Group Topic:Relaxation  Group Date: 06/20/2022 Start Time: 1000 End Time: 1040 Facilitators: Vilma Prader, LRT, CTRS Location:  Dayroom  Group Description: PMR (Progressive Muscle Relaxation). LRT asks patients their current level of stress/anxiety from 1-10, with 10 being the highest. LRT educates patients on what PMR is and the benefits that come from it. Patients are asked to sit with their feet flat on the floor while sitting up and all the way back in their chair, if possible. LRT follows prompt that requires the patients to tense and release different muscles in their body and focus on their breathing. During session, lights are off and soft music is being played. At the end of the prompt, LRT asks patients to rank their current levels of stress/anxiety from 1-10, 10 being the highest.   Affect/Mood: N/A   Participation Level: Did not attend    Clinical Observations/Individualized Feedback: Travis Sandoval did not attend group due to resting in his room.   Plan: Continue to engage patient in RT group sessions 2-3x/week.   Vilma Prader, LRT, CTRS 06/20/2022 11:10 AM

## 2022-06-21 DIAGNOSIS — F203 Undifferentiated schizophrenia: Secondary | ICD-10-CM | POA: Diagnosis not present

## 2022-06-21 NOTE — BH IP Treatment Plan (Signed)
Interdisciplinary Treatment and Diagnostic Plan Update  06/21/2022 Time of Session: 0830 Travis Sandoval MRN: [id number]  Principal Diagnosis: Schizophrenia [facility])  Secondary Diagnoses: Principal Problem:   Schizophrenia ([place]) Active Problems:   Psychosis ([place])   Current Medications:  Current Facility-Administered Medications  Medication Dose Route Frequency Provider Last Rate Last Admin   acetaminophen (TYLENOL) tablet 650 mg  650 mg Oral Q6H PRN [name], NP       alum & mag hydroxide-simeth (MAALOX/MYLANTA) 200-200-20 MG/5ML suspension 30 mL  30 mL Oral Q4H PRN [name], NP   30 mL at 05/22/22 0816   cloZAPine (CLOZARIL) tablet 250 mg  250 mg Oral QHS [name], [name], MD   250 mg at 06/20/22 2112   docusate sodium (COLACE) capsule 100 mg  100 mg Oral BID [name], [name], MD   100 mg at 06/21/22 0951   famotidine (PEPCID) tablet 20 mg  20 mg Oral BID [name], [name], MD   20 mg at 06/21/22 0951   hydrOXYzine (ATARAX) tablet 50 mg  50 mg Oral Q6H PRN [name], [name], MD   50 mg at 05/17/22 0944   magnesium citrate solution 1 Bottle  1 Bottle Oral Daily PRN Wynelle Cleveland, RPH       magnesium hydroxide (MILK OF MAGNESIA) suspension 30 mL  30 mL Oral Daily PRN Wynelle Cleveland, RPH       nicotine polacrilex (NICORETTE) gum 2 mg  2 mg Oral PRN [name], [name], MD   2 mg at 04/23/22 2200   paliperidone (INVEGA SUSTENNA) injection 156 mg  156 mg Intramuscular Q28 days [name], [name], MD   156 mg at 05/30/22 1100   senna-docusate (Senokot-S) tablet 2 tablet  2 tablet Oral Daily PRN [name], [name], MD   2 tablet at 06/19/22 1651   ziprasidone (GEODON) injection 20 mg  20 mg Intramuscular Q12H PRN [name], MD       PTA Medications: Medications Prior to Admission  Medication Sig Dispense Refill Last Dose   ABILIFY MAINTENA 400 MG SRER injection Inject 400 mg into the muscle every 28 (twenty-eight) days.      ARIPiprazole (ABILIFY) 10 MG tablet Take 10 mg by  mouth daily. (Patient not taking: Reported on 04/16/2022)      atomoxetine (STRATTERA) 40 MG capsule Take 40 mg by mouth daily. (Patient not taking: Reported on 04/16/2022)      atorvastatin (LIPITOR) 40 MG tablet Take 40 mg by mouth daily.      budesonide-formoterol (SYMBICORT) 160-4.5 MCG/ACT inhaler Inhale 2 puffs into the lungs.      cetirizine (ZYRTEC) 10 MG tablet Take 10 mg by mouth daily.      clozapine (CLOZARIL) 200 MG tablet Take 200 mg by mouth 2 (two) times daily. Take along with one 25 mg tablet for total 225 mg twice daily      cloZAPine (CLOZARIL) 25 MG tablet Take 25 mg by mouth 2 (two) times daily. Take along with one 200 mg tablet for total 225 mg twice daily      Dexlansoprazole (DEXILANT) 30 MG capsule Take 30 mg by mouth daily. (Patient not taking: Reported on 04/16/2022)      hydrOXYzine (VISTARIL) 50 MG capsule Take 50 mg by mouth 3 (three) times daily as needed for itching. (Patient not taking: Reported on 04/16/2022)      Melatonin 5 MG TABS Take 5 mg by mouth at bedtime as needed (sleep).  metoprolol succinate (TOPROL-XL) 25 MG 24 hr tablet Take 12.5 mg by mouth daily.      Phosphatidyl Choline 65 MG TABS Take 1 tablet by mouth daily.  (Patient not taking: Reported on 04/16/2022)      valproic acid (DEPAKENE) 250 MG/5ML solution Take 750-1,000 mLs by mouth 2 (two) times daily. 1000 mg (20 mL) every morning and 750 mg (15 mL) daily at bedtime       Patient Stressors: Other: Psychosis    Patient Strengths: Ability for insight  Active sense of humor  Average or above average intelligence  Capable of independent living  Supportive family/friends   Treatment Modalities: Medication Management, Group therapy, Case management,  1 to 1 session with clinician, Psychoeducation, Recreational therapy.   Physician Treatment Plan for Primary Diagnosis: Schizophrenia ([place]) Long Term Goal(s):     Short Term Goals: None, pt is a poor hx.  Medication Management: Evaluate  patient's response, side effects, and tolerance of medication regimen.  Therapeutic Interventions: 1 to 1 sessions, Unit Group sessions and Medication administration.  Evaluation of Outcomes: Adequate for Discharge  Physician Treatment Plan for Secondary Diagnosis: Principal Problem:   Schizophrenia ([place]) Active Problems:   Psychosis ([place])  Long Term Goal(s):     Short Term Goals: None, pt is a poor hx.     Medication Management: Evaluate patient's response, side effects, and tolerance of medication regimen.  Therapeutic Interventions: 1 to 1 sessions, Unit Group sessions and Medication administration.  Evaluation of Outcomes: Adequate for Discharge   RN Treatment Plan for Primary Diagnosis: Schizophrenia ([place]) Long Term Goal(s): Knowledge of disease and therapeutic regimen to maintain health will improve  Short Term Goals: Ability to remain free from injury will improve, Ability to verbalize frustration and anger appropriately will improve, Ability to demonstrate self-control, Ability to participate in decision making will improve, Ability to verbalize feelings will improve, Ability to disclose and discuss suicidal ideas, Ability to identify and develop effective coping behaviors will improve, and Compliance with prescribed medications will improve  Medication Management: RN will administer medications as ordered by provider, will assess and evaluate patient's response and provide education to patient for prescribed medication. RN will report any adverse and/or side effects to prescribing provider.  Therapeutic Interventions: 1 on 1 counseling sessions, Psychoeducation, Medication administration, Evaluate responses to treatment, Monitor vital signs and CBGs as ordered, Perform/monitor CIWA, COWS, AIMS and Fall Risk screenings as ordered, Perform wound care treatments as ordered.  Evaluation of Outcomes: Adequate for Discharge   LCSW Treatment Plan for Primary Diagnosis:  Schizophrenia [facility]) Long Term Goal(s): Safe transition to appropriate next level of care at discharge, Engage patient in therapeutic group addressing interpersonal concerns.  Short Term Goals: Engage patient in aftercare planning with referrals and resources, Increase social support, Increase ability to appropriately verbalize feelings, Increase emotional regulation, Facilitate acceptance of mental health diagnosis and concerns, Facilitate patient progression through stages of change regarding substance use diagnoses and concerns, Identify triggers associated with mental health/substance abuse issues, and Increase skills for wellness and recovery  Therapeutic Interventions: Assess for all discharge needs, 1 to 1 time with Social worker, Explore available resources and support systems, Assess for adequacy in community support network, Educate family and significant other(s) on suicide prevention, Complete Psychosocial Assessment, Interpersonal group therapy.  Evaluation of Outcomes: Adequate for Discharge   Progress in Treatment: Attending groups: No. Participating in groups: No. Taking medication as prescribed: Yes. Toleration medication: Yes. Family/Significant other contact made: Yes, individual(s) contacted:  Annice Pih,  father, [phone] Patient understands diagnosis: Yes. Discussing patient identified problems/goals with staff: No. Medical problems stabilized or resolved: Yes. Denies suicidal/homicidal ideation: Yes. Issues/concerns per patient self-inventory: Yes. Other: none  New problem(s) identified: No, Describe:  none  New Short Term/Long Term Goal(s): Patient to work towards elimination of symptoms of psychosis, medication management for mood stabilization; development of comprehensive mental wellness plan.  Patient Goals: No additional goals identified at this time. Patient to continue to work towards original goals identified in initial treatment team meeting. CSW will  remain available to patient should they voice additional treatment goals.    Discharge Plan or Barriers: Patient is boarding, lacks adequate housing/supervision at discharge. CSW team to continue providing case management to obtain funding for group home placement.   Reason for Continuation of Hospitalization: Delusions  Medication stabilization  Estimated Length of Stay: unknown  Last [place] Suicide Severity Risk Score: Flowsheet Row Admission (Current) from 04/16/2022 in [place] ED from 04/15/2022 in [facility] Department at [place] No Risk No Risk       Scribe for Treatment Team: Larose Kells 06/21/2022 2:23 PM

## 2022-06-21 NOTE — Group Note (Signed)
Recreation Therapy Group Note   Group Topic:Coping Skills  Group Date: 06/21/2022 Start Time: 1000 End Time: 1045 Facilitators: Vilma Prader, LRT, CTRS Location:  Craft Room  Group Description: Mind Map.  Patient was provided a blank template of a diagram with 32 blank boxes in a tiered system, branching from the center (similar to a bubble chart). LRT directed patients to label the middle of the diagram "Coping Skills". LRT and patients then came up with 8 different coping skills as examples. Pt were directed to record their coping skills in the 2nd tier boxes closest to the center.  Patients would then share their coping skills with the group as LRT wrote them out. LRT gave a handout of 100 different coping skills at the end of group.   Affect/Mood: N/A   Participation Level: Did not attend    Clinical Observations/Individualized Feedback: [place] did not attend group due to resting in his room.  Plan: Continue to engage patient in RT group sessions 2-3x/week.   Vilma Prader, LRT, CTRS 06/21/2022 11:20 AM

## 2022-06-21 NOTE — Group Note (Signed)
[facility] LCSW Group Therapy Note   Group Date: 06/21/2022 Start Time: 1300 End Time: 1400   Type of Therapy/Topic:  Group Therapy:  Balance in Life  Participation Level:  Did Not Attend   Description of Group:    This group will address the concept of balance and how it feels and looks when one is unbalanced. Patients will be encouraged to process areas in their lives that are out of balance, and identify reasons for remaining unbalanced. Facilitators will guide patients utilizing problem- solving interventions to address and correct the stressor making their life unbalanced. Understanding and applying boundaries will be explored and addressed for obtaining  and maintaining a balanced life. Patients will be encouraged to explore ways to assertively make their unbalanced needs known to significant others in their lives, using other group members and facilitator for support and feedback.  Therapeutic Goals: Patient will identify two or more emotions or situations they have that consume much of in their lives. Patient will identify signs/triggers that life has become out of balance:  Patient will identify two ways to set boundaries in order to achieve balance in their lives:  Patient will demonstrate ability to communicate their needs through discussion and/or role plays  Summary of Patient Progress:    X    Therapeutic Modalities:   Cognitive Behavioral Therapy Solution-Focused Therapy Assertiveness Training   Rozann Lesches, LCSW

## 2022-06-21 NOTE — Plan of Care (Signed)
  Problem: Education: Goal: Knowledge of General Education information will improve Description: Including pain rating scale, medication(s)/side effects and non-pharmacologic comfort measures Outcome: Progressing   Problem: Health Behavior/Discharge Planning: Goal: Ability to manage health-related needs will improve Outcome: Progressing   Problem: Clinical Measurements: Goal: Ability to maintain clinical measurements within normal limits will improve Outcome: Progressing Goal: Will remain free from infection Outcome: Progressing Goal: Diagnostic test results will improve Outcome: Progressing Goal: Respiratory complications will improve Outcome: Progressing Goal: Cardiovascular complication will be avoided Outcome: Progressing   Problem: Activity: Goal: Risk for activity intolerance will decrease Outcome: Progressing   Problem: Nutrition: Goal: Adequate nutrition will be maintained Outcome: Progressing   Problem: Coping: Goal: Level of anxiety will decrease Outcome: Progressing   Problem: Elimination: Goal: Will not experience complications related to urinary retention Outcome: Progressing   Problem: Pain Managment: Goal: General experience of comfort will improve Outcome: Progressing   Problem: Safety: Goal: Ability to remain free from injury will improve Outcome: Progressing   Problem: Skin Integrity: Goal: Risk for impaired skin integrity will decrease Outcome: Progressing   Problem: Education: Goal: Knowledge of [place] General Education information/materials will improve Outcome: Progressing Goal: Emotional status will improve Outcome: Progressing Goal: Mental status will improve Outcome: Progressing Goal: Verbalization of understanding the information provided will improve Outcome: Progressing   Problem: Activity: Goal: Interest or engagement in activities will improve Outcome: Progressing Goal: Sleeping patterns will improve Outcome:  Progressing   Problem: Coping: Goal: Ability to verbalize frustrations and anger appropriately will improve Outcome: Progressing Goal: Ability to demonstrate self-control will improve Outcome: Progressing   Problem: Health Behavior/Discharge Planning: Goal: Identification of resources available to assist in meeting health care needs will improve Outcome: Progressing Goal: Compliance with treatment plan for underlying cause of condition will improve Outcome: Progressing   Problem: Physical Regulation: Goal: Ability to maintain clinical measurements within normal limits will improve Outcome: Progressing   Problem: Safety: Goal: Periods of time without injury will increase Outcome: Progressing   Problem: Activity: Goal: Will verbalize the importance of balancing activity with adequate rest periods Outcome: Progressing   Problem: Education: Goal: Will be free of psychotic symptoms Outcome: Progressing Goal: Knowledge of the prescribed therapeutic regimen will improve Outcome: Progressing   Problem: Coping: Goal: Coping ability will improve Outcome: Progressing Goal: Will verbalize feelings Outcome: Progressing   Problem: Health Behavior/Discharge Planning: Goal: Compliance with prescribed medication regimen will improve Outcome: Progressing   Problem: Nutritional: Goal: Ability to achieve adequate nutritional intake will improve Outcome: Progressing   Problem: Role Relationship: Goal: Ability to communicate needs accurately will improve Outcome: Progressing Goal: Ability to interact with others will improve Outcome: Progressing   Problem: Safety: Goal: Ability to redirect hostility and anger into socially appropriate behaviors will improve Outcome: Progressing Goal: Ability to remain free from injury will improve Outcome: Progressing   Problem: Self-Care: Goal: Ability to participate in self-care as condition permits will improve Outcome: Progressing    Problem: Self-Concept: Goal: Will verbalize positive feelings about self Outcome: Progressing   

## 2022-06-21 NOTE — Progress Notes (Signed)
[facility] MD Progress Note  06/21/2022 11:28 AM Travis Sandoval  MRN:  [id number] Subjective: Follow-up 42 year old man with schizophrenia.  Continues to stay almost entirely isolated to his room but he is eating.  Blood pressure a little labile but overall under good control.  No new physical complaints.  We continue to be focused entirely really on placement Principal Problem: Schizophrenia ([place]) Diagnosis: Principal Problem:   Schizophrenia ([place]) Active Problems:   Psychosis ([place])  Total Time spent with patient: 30 minutes  Past Psychiatric History: Past history of schizophrenia including lengthy hospitalizations  Past Medical History:  Past Medical History:  Diagnosis Date   ADHD    Bipolar 1 disorder ([place])    Hepatitis C    Schizoaffective disorder ([place])    History reviewed. No pertinent surgical history. Family History:  Family History  Family history unknown: Yes   Family Psychiatric  History: None reported Social History:  Social History   Substance and Sexual Activity  Alcohol Use Not Currently     Social History   Substance and Sexual Activity  Drug Use Not Currently    Social History   Socioeconomic History   Marital status: Unknown    Spouse name: Not on file   Number of children: Not on file   Years of education: Not on file   Highest education level: Not on file  Occupational History   Not on file  Tobacco Use   Smoking status: Every Day    Packs/day: 0.25    Years: 15.00    Total pack years: 3.75    Types: Cigarettes   Smokeless tobacco: Not on file  Vaping Use   Vaping Use: Not on file  Substance and Sexual Activity   Alcohol use: Not Currently   Drug use: Not Currently   Sexual activity: Not Currently  Other Topics Concern   Not on file  Social History Narrative   Not on file   Social Determinants of Health   Financial Resource Strain: Not on file  Food Insecurity: Unknown (05/18/2022)   Hunger Vital Sign    Worried About Running Out of  Food in the Last Year: Patient refused    [place] in the Last Year: Patient refused  Transportation Needs: Unknown (05/18/2022)   [place] - Hydrologist (Medical): Patient refused    Lack of Transportation (Non-Medical): Patient refused  Physical Activity: Not on file  Stress: Not on file  Social Connections: Not on file   Additional Social History:                         Sleep: Fair  Appetite:  Fair  Current Medications: Current Facility-Administered Medications  Medication Dose Route Frequency Provider Last Rate Last Admin   acetaminophen (TYLENOL) tablet 650 mg  650 mg Oral Q6H PRN [name], NP       alum & mag hydroxide-simeth (MAALOX/MYLANTA) 200-200-20 MG/5ML suspension 30 mL  30 mL Oral Q4H PRN [name], NP   30 mL at 05/22/22 0816   cloZAPine (CLOZARIL) tablet 250 mg  250 mg Oral QHS Bj Morlock T, MD   250 mg at 06/20/22 2112   docusate sodium (COLACE) capsule 100 mg  100 mg Oral BID Larisha Vencill T, MD   100 mg at 06/21/22 0951   famotidine (PEPCID) tablet 20 mg  20 mg Oral BID Meenakshi Sazama, [name], MD   20 mg at 06/21/22 [phone]  hydrOXYzine (ATARAX) tablet 50 mg  50 mg Oral Q6H PRN Ia Leeb T, MD   50 mg at 05/17/22 0944   magnesium citrate solution 1 Bottle  1 Bottle Oral Daily PRN Wynelle Cleveland, RPH       magnesium hydroxide (MILK OF MAGNESIA) suspension 30 mL  30 mL Oral Daily PRN Wynelle Cleveland, RPH       nicotine polacrilex (NICORETTE) gum 2 mg  2 mg Oral PRN Leitha Hyppolite T, MD   2 mg at 04/23/22 2200   paliperidone (INVEGA SUSTENNA) injection 156 mg  156 mg Intramuscular Q28 days Theodore Virgin T, MD   156 mg at 05/30/22 1100   senna-docusate (Senokot-S) tablet 2 tablet  2 tablet Oral Daily PRN Roylene Heaton, [name], MD   2 tablet at 06/19/22 1651   ziprasidone (GEODON) injection 20 mg  20 mg Intramuscular Q12H PRN [name], MD        Lab Results: No results found for this or any previous  visit (from the past 48 hour(s)).  Blood Alcohol level:  Lab Results  Component Value Date   ETH <10 0000000    Metabolic Disorder Labs: Lab Results  Component Value Date   HGBA1C 5.5 04/24/2022   MPG 111 04/24/2022   No results found for: "PROLACTIN" Lab Results  Component Value Date   CHOL 220 (H) 04/24/2022   TRIG 700 (H) 04/24/2022   HDL 35 (L) 04/24/2022   CHOLHDL 6.3 04/24/2022   VLDL UNABLE TO CALCULATE IF TRIGLYCERIDE OVER 400 mg/dL 04/24/2022   LDLCALC UNABLE TO CALCULATE IF TRIGLYCERIDE OVER 400 mg/dL 04/24/2022    Physical Findings: AIMS: Facial and Oral Movements Muscles of Facial Expression: None, normal Lips and Perioral Area: None, normal Jaw: None, normal Tongue: None, normal,Extremity Movements Upper (arms, wrists, hands, fingers): None, normal Lower (legs, knees, ankles, toes): None, normal, Trunk Movements Neck, shoulders, hips: None, normal, Overall Severity Severity of abnormal movements (highest score from questions above): None, normal Incapacitation due to abnormal movements: None, normal Patient's awareness of abnormal movements (rate only patient's report): No Awareness, Dental Status Current problems with teeth and/or dentures?: No Does patient usually wear dentures?: No  CIWA:    COWS:     Musculoskeletal: Strength & Muscle Tone: within normal limits Gait & Station: normal Patient leans: N/A  Psychiatric Specialty Exam:  Presentation  General Appearance:  Disheveled  Eye Contact: Minimal  Speech: Pressured  Speech Volume: Normal  Handedness: Right   Mood and Affect  Mood: Euphoric; Irritable  Affect: Inappropriate   Thought Process  Thought Processes: Disorganized  Descriptions of Associations:Loose  Orientation:Partial  Thought Content:Illogical; Scattered  History of Schizophrenia/Schizoaffective disorder:No data recorded Duration of Psychotic Symptoms:No data recorded Hallucinations:No data  recorded Ideas of Reference:Delusions; Paranoia  Suicidal Thoughts:No data recorded Homicidal Thoughts:No data recorded  Sensorium  Memory: Immediate Poor; Recent Poor; Remote Poor  Judgment: Poor  Insight: Poor   Executive Functions  Concentration: Fair  Attention Span: Fair  Recall: Poor  Fund of Knowledge: Poor  Language: Poor   Psychomotor Activity  Psychomotor Activity:No data recorded  Assets  Assets: Desire for Improvement; Social Support   Sleep  Sleep:No data recorded   Physical Exam: Physical Exam Vitals and nursing note reviewed.  Constitutional:      Appearance: Normal appearance.  HENT:     Head: Normocephalic and atraumatic.     Mouth/Throat:     Pharynx: Oropharynx is clear.  Eyes:     Pupils: Pupils are  equal, round, and reactive to light.  Cardiovascular:     Rate and Rhythm: Normal rate and regular rhythm.  Pulmonary:     Effort: Pulmonary effort is normal.     Breath sounds: Normal breath sounds.  Abdominal:     General: Abdomen is flat.     Palpations: Abdomen is soft.  Musculoskeletal:        General: Normal range of motion.  Skin:    General: Skin is warm and dry.  Neurological:     General: No focal deficit present.     Mental Status: He is alert. Mental status is at baseline.  Psychiatric:        Attention and Perception: He is inattentive.        Mood and Affect: Mood normal. Affect is blunt.        Speech: Speech is delayed.        Behavior: Behavior is slowed and withdrawn.        Thought Content: Thought content is paranoid.        Cognition and Memory: Cognition is impaired.    Review of Systems  Constitutional: Negative.   HENT: Negative.    Eyes: Negative.   Respiratory: Negative.    Cardiovascular: Negative.   Gastrointestinal: Negative.   Musculoskeletal: Negative.   Skin: Negative.   Neurological: Negative.   Psychiatric/Behavioral: Negative.     Blood pressure 106/76, pulse (!) 107,  temperature 97.9 F (36.6 C), temperature source Oral, resp. rate 18, height '5\' 9"'$  (1.753 m), weight 96.4 kg, SpO2 97 %. Body mass index is 31.38 kg/m.   Treatment Plan Summary: Plan stable on current medicine regimen.  No change to plan for now except to continue working with full treatment team on discharge planning  Alethia Berthold, MD 06/21/2022, 11:28 AM

## 2022-06-21 NOTE — Progress Notes (Signed)
D- Patient alert and oriented x4. Affect flat/mood sad. Denies SI/HI/ AVH. He denies pain. Quotes. States "I am just waiting on money or something to discharge". A- Scheduled medications administered to patient, per MD orders. Support and encouragement provided.  Routine safety checks conducted every 15 minutes without incident.  Patient informed to notify staff with problems or concerns. R- No adverse drug reactions noted. Patient compliant with medications and treatment plan. Patient receptive, calm, and cooperative. He isolates to room during the sift except for meals. Patient contracts for safety and remains safe on the unit at this time.

## 2022-06-22 DIAGNOSIS — F203 Undifferentiated schizophrenia: Secondary | ICD-10-CM | POA: Diagnosis not present

## 2022-06-22 NOTE — Progress Notes (Signed)
D: Patient alert and oriented x 4, affect is flat but brightens upon approach, patient denies SI/HI/AVH. Patient is pleasant and cooperative, he appears less anxious and he is interacting with peers and staff appropriately.  A: Patient was offered support and encouragement, she was given scheduled medications and encouraged to attend groups. 15 minutes safety checks were done for safety.  R: Patient attends groups and interacts well with peers and staff. Patient is complaint with medication regimen. 15 minutes safety checks maintained will continue to monitor.

## 2022-06-22 NOTE — Progress Notes (Signed)
D- Patient alert and oriented. Affect flat/mood sad. Denies SI/ HI/ AVH. A- Scheduled medications administered to patient, per MD orders. Support and encouragement provided.  Routine safety checks conducted every 15 minutes.  Patient informed to notify staff with problems or concerns. R- No adverse drug reactions noted. Patient contracts for safety at this time. Patient compliant with medications and treatment plan. Patient receptive, calm, and cooperative. Patient isolates to room except for meals. Patient contracts for safety and remains safe on the unit at this time.

## 2022-06-22 NOTE — Plan of Care (Signed)
  Problem: Education: Goal: Knowledge of General Education information will improve Description: Including pain rating scale, medication(s)/side effects and non-pharmacologic comfort measures Outcome: Not Progressing   Problem: Health Behavior/Discharge Planning: Goal: Ability to manage health-related needs will improve Outcome: Not Progressing   Problem: Clinical Measurements: Goal: Ability to maintain clinical measurements within normal limits will improve Outcome: Not Progressing Goal: Will remain free from infection Outcome: Not Progressing Goal: Diagnostic test results will improve Outcome: Not Progressing Goal: Respiratory complications will improve Outcome: Not Progressing Goal: Cardiovascular complication will be avoided Outcome: Not Progressing   Problem: Activity: Goal: Risk for activity intolerance will decrease Outcome: Not Progressing   Problem: Nutrition: Goal: Adequate nutrition will be maintained Outcome: Not Progressing   Problem: Coping: Goal: Level of anxiety will decrease Outcome: Not Progressing   Problem: Elimination: Goal: Will not experience complications related to urinary retention Outcome: Not Progressing   Problem: Pain Managment: Goal: General experience of comfort will improve Outcome: Not Progressing   Problem: Safety: Goal: Ability to remain free from injury will improve Outcome: Not Progressing   Problem: Skin Integrity: Goal: Risk for impaired skin integrity will decrease Outcome: Not Progressing   Problem: Education: Goal: Knowledge of [place] General Education information/materials will improve Outcome: Not Progressing Goal: Emotional status will improve Outcome: Not Progressing Goal: Mental status will improve Outcome: Not Progressing Goal: Verbalization of understanding the information provided will improve Outcome: Not Progressing   Problem: Activity: Goal: Interest or engagement in activities will  improve Outcome: Not Progressing Goal: Sleeping patterns will improve Outcome: Not Progressing   Problem: Coping: Goal: Ability to verbalize frustrations and anger appropriately will improve Outcome: Not Progressing Goal: Ability to demonstrate self-control will improve Outcome: Not Progressing   Problem: Health Behavior/Discharge Planning: Goal: Identification of resources available to assist in meeting health care needs will improve Outcome: Not Progressing Goal: Compliance with treatment plan for underlying cause of condition will improve Outcome: Not Progressing   Problem: Physical Regulation: Goal: Ability to maintain clinical measurements within normal limits will improve Outcome: Not Progressing   Problem: Safety: Goal: Periods of time without injury will increase Outcome: Not Progressing   Problem: Activity: Goal: Will verbalize the importance of balancing activity with adequate rest periods Outcome: Not Progressing   Problem: Education: Goal: Will be free of psychotic symptoms Outcome: Not Progressing Goal: Knowledge of the prescribed therapeutic regimen will improve Outcome: Not Progressing   Problem: Coping: Goal: Coping ability will improve Outcome: Not Progressing Goal: Will verbalize feelings Outcome: Not Progressing   Problem: Health Behavior/Discharge Planning: Goal: Compliance with prescribed medication regimen will improve Outcome: Not Progressing   Problem: Nutritional: Goal: Ability to achieve adequate nutritional intake will improve Outcome: Not Progressing   Problem: Role Relationship: Goal: Ability to communicate needs accurately will improve Outcome: Not Progressing Goal: Ability to interact with others will improve Outcome: Not Progressing   Problem: Safety: Goal: Ability to redirect hostility and anger into socially appropriate behaviors will improve Outcome: Not Progressing Goal: Ability to remain free from injury will  improve Outcome: Not Progressing   Problem: Self-Care: Goal: Ability to participate in self-care as condition permits will improve Outcome: Not Progressing   Problem: Self-Concept: Goal: Will verbalize positive feelings about self Outcome: Not Progressing

## 2022-06-22 NOTE — [facility] Group Notes (Signed)
[facility] Group Notes:  (Nursing/MHT/Case Management/Adjunct)  Date:  06/22/2022  Time:  2:35 PM  Type of Therapy:   NA/AA  Participation Level:  Did Not Attend   Adela Lank [facility] 06/22/2022, 2:35 PM

## 2022-06-22 NOTE — Progress Notes (Signed)
[facility] MD Progress Note  06/22/2022 2:10 PM Travis Sandoval  MRN:  [id number] Subjective: Follow-up patient with schizophrenia who remains stable.  Primarily in need of placement.  He has no complaints. Principal Problem: Schizophrenia ([place]) Diagnosis: Principal Problem:   Schizophrenia ([place]) Active Problems:   Psychosis ([place])  Total Time spent with patient: 30 minutes  Past Psychiatric History: Past history of schizophrenia  Past Medical History:  Past Medical History:  Diagnosis Date   ADHD    Bipolar 1 disorder ([place])    Hepatitis C    Schizoaffective disorder ([place])    History reviewed. No pertinent surgical history. Family History:  Family History  Family history unknown: Yes   Family Psychiatric  History: See previous Social History:  Social History   Substance and Sexual Activity  Alcohol Use Not Currently     Social History   Substance and Sexual Activity  Drug Use Not Currently    Social History   Socioeconomic History   Marital status: Unknown    Spouse name: Not on file   Number of children: Not on file   Years of education: Not on file   Highest education level: Not on file  Occupational History   Not on file  Tobacco Use   Smoking status: Every Day    Packs/day: 0.25    Years: 15.00    Total pack years: 3.75    Types: Cigarettes   Smokeless tobacco: Not on file  Vaping Use   Vaping Use: Not on file  Substance and Sexual Activity   Alcohol use: Not Currently   Drug use: Not Currently   Sexual activity: Not Currently  Other Topics Concern   Not on file  Social History Narrative   Not on file   Social Determinants of Health   Financial Resource Strain: Not on file  Food Insecurity: Unknown (05/18/2022)   Hunger Vital Sign    Worried About Running Out of Food in the Last Year: Patient refused    [place] in the Last Year: Patient refused  Transportation Needs: Unknown (05/18/2022)   [place] - Hydrologist  (Medical): Patient refused    Lack of Transportation (Non-Medical): Patient refused  Physical Activity: Not on file  Stress: Not on file  Social Connections: Not on file   Additional Social History:                         Sleep: Fair  Appetite:  Fair  Current Medications: Current Facility-Administered Medications  Medication Dose Route Frequency Provider Last Rate Last Admin   acetaminophen (TYLENOL) tablet 650 mg  650 mg Oral Q6H PRN [name], NP       alum & mag hydroxide-simeth (MAALOX/MYLANTA) 200-200-20 MG/5ML suspension 30 mL  30 mL Oral Q4H PRN [name], NP   30 mL at 05/22/22 0816   cloZAPine (CLOZARIL) tablet 250 mg  250 mg Oral QHS Jenice Leiner T, MD   250 mg at 06/21/22 2210   docusate sodium (COLACE) capsule 100 mg  100 mg Oral BID Antonisha Waskey T, MD   100 mg at 06/22/22 0915   famotidine (PEPCID) tablet 20 mg  20 mg Oral BID Syriana Croslin T, MD   20 mg at 06/22/22 0915   hydrOXYzine (ATARAX) tablet 50 mg  50 mg Oral Q6H PRN Arvis Miguez, [name], MD   50 mg at 05/17/22 0944   magnesium citrate solution 1  Bottle  1 Bottle Oral Daily PRN Wynelle Cleveland, RPH       magnesium hydroxide (MILK OF MAGNESIA) suspension 30 mL  30 mL Oral Daily PRN Wynelle Cleveland, RPH       nicotine polacrilex (NICORETTE) gum 2 mg  2 mg Oral PRN Aldona Bryner T, MD   2 mg at 04/23/22 2200   paliperidone (INVEGA SUSTENNA) injection 156 mg  156 mg Intramuscular Q28 days Amaia Lavallie, [name], MD   156 mg at 05/30/22 1100   senna-docusate (Senokot-S) tablet 2 tablet  2 tablet Oral Daily PRN Brayon Bielefeld, [name], MD   2 tablet at 06/21/22 2211   ziprasidone (GEODON) injection 20 mg  20 mg Intramuscular Q12H PRN [name], MD        Lab Results: No results found for this or any previous visit (from the past 29 hour(s)).  Blood Alcohol level:  Lab Results  Component Value Date   ETH <10 0000000    Metabolic Disorder Labs: Lab Results  Component Value Date   HGBA1C  5.5 04/24/2022   MPG 111 04/24/2022   No results found for: "PROLACTIN" Lab Results  Component Value Date   CHOL 220 (H) 04/24/2022   TRIG 700 (H) 04/24/2022   HDL 35 (L) 04/24/2022   CHOLHDL 6.3 04/24/2022   VLDL UNABLE TO CALCULATE IF TRIGLYCERIDE OVER 400 mg/dL 04/24/2022   LDLCALC UNABLE TO CALCULATE IF TRIGLYCERIDE OVER 400 mg/dL 04/24/2022    Physical Findings: AIMS: Facial and Oral Movements Muscles of Facial Expression: None, normal Lips and Perioral Area: None, normal Jaw: None, normal Tongue: None, normal,Extremity Movements Upper (arms, wrists, hands, fingers): None, normal Lower (legs, knees, ankles, toes): None, normal, Trunk Movements Neck, shoulders, hips: None, normal, Overall Severity Severity of abnormal movements (highest score from questions above): None, normal Incapacitation due to abnormal movements: None, normal Patient's awareness of abnormal movements (rate only patient's report): No Awareness, Dental Status Current problems with teeth and/or dentures?: No Does patient usually wear dentures?: No  CIWA:    COWS:     Musculoskeletal: Strength & Muscle Tone: within normal limits Gait & Station: normal Patient leans: N/A  Psychiatric Specialty Exam:  Presentation  General Appearance:  Disheveled  Eye Contact: Minimal  Speech: Pressured  Speech Volume: Normal  Handedness: Right   Mood and Affect  Mood: Euphoric; Irritable  Affect: Inappropriate   Thought Process  Thought Processes: Disorganized  Descriptions of Associations:Loose  Orientation:Partial  Thought Content:Illogical; Scattered  History of Schizophrenia/Schizoaffective disorder:No data recorded Duration of Psychotic Symptoms:No data recorded Hallucinations:No data recorded Ideas of Reference:Delusions; Paranoia  Suicidal Thoughts:No data recorded Homicidal Thoughts:No data recorded  Sensorium  Memory: Immediate Poor; Recent Poor; Remote  Poor  Judgment: Poor  Insight: Poor   Executive Functions  Concentration: Fair  Attention Span: Fair  Recall: Poor  Fund of Knowledge: Poor  Language: Poor   Psychomotor Activity  Psychomotor Activity:No data recorded  Assets  Assets: Desire for Improvement; Social Support   Sleep  Sleep:No data recorded   Physical Exam: Physical Exam Vitals and nursing note reviewed.  Constitutional:      Appearance: Normal appearance.  HENT:     Head: Normocephalic and atraumatic.     Mouth/Throat:     Pharynx: Oropharynx is clear.  Eyes:     Pupils: Pupils are equal, round, and reactive to light.  Cardiovascular:     Rate and Rhythm: Normal rate and regular rhythm.  Pulmonary:     Effort: Pulmonary  effort is normal.     Breath sounds: Normal breath sounds.  Abdominal:     General: Abdomen is flat.     Palpations: Abdomen is soft.  Musculoskeletal:        General: Normal range of motion.  Skin:    General: Skin is warm and dry.  Neurological:     General: No focal deficit present.     Mental Status: He is alert. Mental status is at baseline.  Psychiatric:        Attention and Perception: He is inattentive.        Mood and Affect: Mood normal. Affect is blunt.        Speech: Speech is delayed.        Behavior: Behavior is slowed.        Thought Content: Thought content is delusional.    Review of Systems  Constitutional: Negative.   HENT: Negative.    Eyes: Negative.   Respiratory: Negative.    Cardiovascular: Negative.   Gastrointestinal: Negative.   Musculoskeletal: Negative.   Skin: Negative.   Neurological: Negative.   Psychiatric/Behavioral: Negative.     Blood pressure 122/80, pulse 100, temperature 97.7 F (36.5 C), temperature source Oral, resp. rate 18, height '5\' 9"'$  (1.753 m), weight 96.4 kg, SpO2 98 %. Body mass index is 31.38 kg/m.   Treatment Plan Summary: Medication management and Plan patient appears to be stable.  Vital stable.   No behavior problems.  No change to treatment plan he continues to be primarily here awaiting placement.  Alethia Berthold, MD 06/22/2022, 2:10 PM

## 2022-06-23 DIAGNOSIS — F203 Undifferentiated schizophrenia: Secondary | ICD-10-CM | POA: Diagnosis not present

## 2022-06-23 MED ORDER — DOCUSATE SODIUM 100 MG PO CAPS
200.0000 mg | ORAL_CAPSULE | Freq: Two times a day (BID) | ORAL | Status: DC
  Administered 2022-06-23 – 2022-07-27 (×48): 200 mg via ORAL
  Filled 2022-06-23 (×62): qty 2

## 2022-06-23 MED ORDER — CLOZAPINE 100 MG PO TABS
300.0000 mg | ORAL_TABLET | Freq: Every day | ORAL | Status: DC
  Administered 2022-06-23 – 2022-07-01 (×9): 300 mg via ORAL
  Filled 2022-06-23 (×9): qty 3

## 2022-06-23 NOTE — Progress Notes (Signed)
Patient alert and oriented x 4 affect is flat but brightens upon approach no distress noted, speech is coherent.monitor. patient complained of constipation medicated with senokot and asked to increase fluid and walk around. Patient was receptive to staff.  15 minutes safety checks maintained will continue to closely.

## 2022-06-23 NOTE — [facility] Group Notes (Signed)
[facility] Group Notes:  (Nursing/MHT/Case Management/Adjunct)  Date:  06/23/2022  Time:  5:03 PM  Type of Therapy:  Psychoeducational Skills  Participation Level:  Did Not Attend   Adela Lank [facility] 06/23/2022, 5:03 PM

## 2022-06-23 NOTE — BHH Group Notes (Signed)
[facility] Group Notes:  (Nursing/MHT/Case Management/Adjunct)  Date:  06/23/2022  Time:  5:04 PM  Type of Therapy:  Psychoeducational Skills  Participation Level:  Did Not Attend   Adela Lank [facility] 06/23/2022, 5:04 PM

## 2022-06-23 NOTE — Plan of Care (Signed)
Data: Patient is appropriate and cooperative to assessment. Pt is isolative to room. Pt denies SI / HI / AVH. Pt has complaint of Constipation. No signs of distress or injury.   Action:  Q x 15 minute observation checks were completed for safety. Patient was provided with education on medications. Patient was offered support and encouragement. Patient was given scheduled medications. Patient  was encourage to attend groups, participate in unit activities and continue with plan of care.    Response: Patient has no complaints at this time. Patient is receptive to treatment and safety maintained on unit.   Problem: Clinical Measurements: Goal: Ability to maintain clinical measurements within normal limits will improve Outcome: Not Progressing   Problem: Health Behavior/Discharge Planning: Goal: Ability to manage health-related needs will improve Outcome: Not Progressing   Problem: Education: Goal: Knowledge of General Education information will improve Description: Including pain rating scale, medication(s)/side effects and non-pharmacologic comfort measures Outcome: Not Progressing

## 2022-06-23 NOTE — Progress Notes (Signed)
[facility] MD Progress Note  06/23/2022 12:15 PM Travis Sandoval  MRN:  [id number] Subjective: Follow-up 42 year old man with schizophrenia.  Patient has no new complaints.  Delusional if engaged in conversation but no aggression.  Stays in bed most of the time but does not appear to be sleeping. Principal Problem: Schizophrenia ([place]) Diagnosis: Principal Problem:   Schizophrenia ([place]) Active Problems:   Psychosis ([place])  Total Time spent with patient: 30 minutes  Past Psychiatric History: Past history of schizophrenia  Past Medical History:  Past Medical History:  Diagnosis Date   ADHD    Bipolar 1 disorder ([place])    Hepatitis C    Schizoaffective disorder ([place])    History reviewed. No pertinent surgical history. Family History:  Family History  Family history unknown: Yes   Family Psychiatric  History: See previous Social History:  Social History   Substance and Sexual Activity  Alcohol Use Not Currently     Social History   Substance and Sexual Activity  Drug Use Not Currently    Social History   Socioeconomic History   Marital status: Unknown    Spouse name: Not on file   Number of children: Not on file   Years of education: Not on file   Highest education level: Not on file  Occupational History   Not on file  Tobacco Use   Smoking status: Every Day    Packs/day: 0.25    Years: 15.00    Total pack years: 3.75    Types: Cigarettes   Smokeless tobacco: Not on file  Vaping Use   Vaping Use: Not on file  Substance and Sexual Activity   Alcohol use: Not Currently   Drug use: Not Currently   Sexual activity: Not Currently  Other Topics Concern   Not on file  Social History Narrative   Not on file   Social Determinants of Health   Financial Resource Strain: Not on file  Food Insecurity: Unknown (05/18/2022)   Hunger Vital Sign    Worried About Running Out of Food in the Last Year: Patient refused    [place] in the Last Year: Patient refused   Transportation Needs: Unknown (05/18/2022)   [place] - Hydrologist (Medical): Patient refused    Lack of Transportation (Non-Medical): Patient refused  Physical Activity: Not on file  Stress: Not on file  Social Connections: Not on file   Additional Social History:                         Sleep: Fair  Appetite:  Fair  Current Medications: Current Facility-Administered Medications  Medication Dose Route Frequency Provider Last Rate Last Admin   acetaminophen (TYLENOL) tablet 650 mg  650 mg Oral Q6H PRN [name], NP       alum & mag hydroxide-simeth (MAALOX/MYLANTA) 200-200-20 MG/5ML suspension 30 mL  30 mL Oral Q4H PRN [name], NP   30 mL at 05/22/22 0816   cloZAPine (CLOZARIL) tablet 250 mg  250 mg Oral QHS Jaydon Soroka T, MD   250 mg at 06/22/22 2138   docusate sodium (COLACE) capsule 100 mg  100 mg Oral BID Immaculate Crutcher T, MD   100 mg at 06/23/22 0839   famotidine (PEPCID) tablet 20 mg  20 mg Oral BID Arlisha Patalano T, MD   20 mg at 06/23/22 0839   hydrOXYzine (ATARAX) tablet 50 mg  50 mg Oral Q6H PRN  Rivers Gassmann, [name], MD   50 mg at 05/17/22 0944   magnesium citrate solution 1 Bottle  1 Bottle Oral Daily PRN Wynelle Cleveland, RPH       magnesium hydroxide (MILK OF MAGNESIA) suspension 30 mL  30 mL Oral Daily PRN Wynelle Cleveland, RPH       nicotine polacrilex (NICORETTE) gum 2 mg  2 mg Oral PRN Tiffaney Heimann T, MD   2 mg at 04/23/22 2200   paliperidone (INVEGA SUSTENNA) injection 156 mg  156 mg Intramuscular Q28 days Aysel Gilchrest, [name], MD   156 mg at 05/30/22 1100   senna-docusate (Senokot-S) tablet 2 tablet  2 tablet Oral Daily PRN Maybelle Depaoli, [name], MD   2 tablet at 06/22/22 2141   ziprasidone (GEODON) injection 20 mg  20 mg Intramuscular Q12H PRN [name], MD        Lab Results: No results found for this or any previous visit (from the past 48 hour(s)).  Blood Alcohol level:  Lab Results  Component Value Date    ETH <10 0000000    Metabolic Disorder Labs: Lab Results  Component Value Date   HGBA1C 5.5 04/24/2022   MPG 111 04/24/2022   No results found for: "PROLACTIN" Lab Results  Component Value Date   CHOL 220 (H) 04/24/2022   TRIG 700 (H) 04/24/2022   HDL 35 (L) 04/24/2022   CHOLHDL 6.3 04/24/2022   VLDL UNABLE TO CALCULATE IF TRIGLYCERIDE OVER 400 mg/dL 04/24/2022   LDLCALC UNABLE TO CALCULATE IF TRIGLYCERIDE OVER 400 mg/dL 04/24/2022    Physical Findings: AIMS: Facial and Oral Movements Muscles of Facial Expression: None, normal Lips and Perioral Area: None, normal Jaw: None, normal Tongue: None, normal,Extremity Movements Upper (arms, wrists, hands, fingers): None, normal Lower (legs, knees, ankles, toes): None, normal, Trunk Movements Neck, shoulders, hips: None, normal, Overall Severity Severity of abnormal movements (highest score from questions above): None, normal Incapacitation due to abnormal movements: None, normal Patient's awareness of abnormal movements (rate only patient's report): No Awareness, Dental Status Current problems with teeth and/or dentures?: No Does patient usually wear dentures?: No  CIWA:    COWS:     Musculoskeletal: Strength & Muscle Tone: within normal limits Gait & Station: normal Patient leans: N/A  Psychiatric Specialty Exam:  Presentation  General Appearance:  Disheveled  Eye Contact: Minimal  Speech: Pressured  Speech Volume: Normal  Handedness: Right   Mood and Affect  Mood: Euphoric; Irritable  Affect: Inappropriate   Thought Process  Thought Processes: Disorganized  Descriptions of Associations:Loose  Orientation:Partial  Thought Content:Illogical; Scattered  History of Schizophrenia/Schizoaffective disorder:No data recorded Duration of Psychotic Symptoms:No data recorded Hallucinations:No data recorded Ideas of Reference:Delusions; Paranoia  Suicidal Thoughts:No data recorded Homicidal  Thoughts:No data recorded  Sensorium  Memory: Immediate Poor; Recent Poor; Remote Poor  Judgment: Poor  Insight: Poor   Executive Functions  Concentration: Fair  Attention Span: Fair  Recall: Poor  Fund of Knowledge: Poor  Language: Poor   Psychomotor Activity  Psychomotor Activity:No data recorded  Assets  Assets: Desire for Improvement; Social Support   Sleep  Sleep:No data recorded   Physical Exam: Physical Exam Vitals and nursing note reviewed.  Constitutional:      Appearance: Normal appearance.  HENT:     Head: Normocephalic and atraumatic.     Mouth/Throat:     Pharynx: Oropharynx is clear.  Eyes:     Pupils: Pupils are equal, round, and reactive to light.  Cardiovascular:  Rate and Rhythm: Normal rate and regular rhythm.  Pulmonary:     Effort: Pulmonary effort is normal.     Breath sounds: Normal breath sounds.  Abdominal:     General: Abdomen is flat.     Palpations: Abdomen is soft.  Musculoskeletal:        General: Normal range of motion.  Skin:    General: Skin is warm and dry.  Neurological:     General: No focal deficit present.     Mental Status: He is alert. Mental status is at baseline.  Psychiatric:        Attention and Perception: He is inattentive.        Mood and Affect: Mood normal. Affect is blunt.        Speech: Speech is delayed.        Behavior: Behavior is slowed.        Thought Content: Thought content is delusional.        Cognition and Memory: Cognition is impaired. Memory is impaired.    Review of Systems  Constitutional: Negative.   HENT: Negative.    Eyes: Negative.   Respiratory: Negative.    Cardiovascular: Negative.   Gastrointestinal: Negative.   Musculoskeletal: Negative.   Skin: Negative.   Neurological: Negative.   Psychiatric/Behavioral: Negative.     Blood pressure 101/77, pulse (!) 104, temperature 97.6 F (36.4 C), temperature source Oral, resp. rate 18, height '5\' 9"'$  (1.753 m),  weight 96.4 kg, SpO2 98 %. Body mass index is 31.38 kg/m.   Treatment Plan Summary: Plan although we are primarily looking at placement still I am going to go ahead and increase his clozapine to 300 at night.  I do not think it is over sedating him and there may be room to improve the psychosis.  Meanwhile I am going to increase his Colace and make sure the magnesium citrate is available so that he can get that if he needs it for constipation.  Alethia Berthold, MD 06/23/2022, 12:15 PM

## 2022-06-24 DIAGNOSIS — F203 Undifferentiated schizophrenia: Secondary | ICD-10-CM | POA: Diagnosis not present

## 2022-06-24 NOTE — Group Note (Signed)
[facility] LCSW Group Therapy Note   Group Date: 06/24/2022 Start Time: 1300 End Time: 1400  Type of Therapy and Topic:  Group Therapy:  Feelings around Relapse and Recovery  Participation Level:  Did Not Attend   Mood:  Description of Group:    Patients in this group will discuss emotions they experience before and after a relapse. They will process how experiencing these feelings, or avoidance of experiencing them, relates to having a relapse. Facilitator will guide patients to explore emotions they have related to recovery. Patients will be encouraged to process which emotions are more powerful. They will be guided to discuss the emotional reaction significant others in their lives may have to patients' relapse or recovery. Patients will be assisted in exploring ways to respond to the emotions of others without this contributing to a relapse.  Therapeutic Goals: Patient will identify two or more emotions that lead to relapse for them:  Patient will identify two emotions that result when they relapse:  Patient will identify two emotions related to recovery:  Patient will demonstrate ability to communicate their needs through discussion and/or role plays.   Summary of Patient Progress:  Patient did not attend group despite encouraged participation.    Therapeutic Modalities:   Cognitive Behavioral Therapy Solution-Focused Therapy Assertiveness Training Relapse Prevention Therapy   Durenda Hurt, [place]

## 2022-06-24 NOTE — Progress Notes (Signed)
[facility] MD Progress Note  06/24/2022 2:08 PM Travis Sandoval  MRN:  [id number] Subjective: Follow-up patient with schizophrenia who is largely waiting on discharge.  I went ahead and increased his clozapine level since he still evidently has psychotic thinking when you communicate with him. Principal Problem: Schizophrenia ([place]) Diagnosis: Principal Problem:   Schizophrenia ([place]) Active Problems:   Psychosis ([place])  Total Time spent with patient: 30 minutes  Past Psychiatric History: Past history of schizophrenia  Past Medical History:  Past Medical History:  Diagnosis Date   ADHD    Bipolar 1 disorder ([place])    Hepatitis C    Schizoaffective disorder ([place])    History reviewed. No pertinent surgical history. Family History:  Family History  Family history unknown: Yes   Family Psychiatric  History: See previous Social History:  Social History   Substance and Sexual Activity  Alcohol Use Not Currently     Social History   Substance and Sexual Activity  Drug Use Not Currently    Social History   Socioeconomic History   Marital status: Unknown    Spouse name: Not on file   Number of children: Not on file   Years of education: Not on file   Highest education level: Not on file  Occupational History   Not on file  Tobacco Use   Smoking status: Every Day    Packs/day: 0.25    Years: 15.00    Total pack years: 3.75    Types: Cigarettes   Smokeless tobacco: Not on file  Vaping Use   Vaping Use: Not on file  Substance and Sexual Activity   Alcohol use: Not Currently   Drug use: Not Currently   Sexual activity: Not Currently  Other Topics Concern   Not on file  Social History Narrative   Not on file   Social Determinants of Health   Financial Resource Strain: Not on file  Food Insecurity: Unknown (05/18/2022)   Hunger Vital Sign    Worried About Running Out of Food in the Last Year: Patient refused    [place] in the Last Year: Patient refused   Transportation Needs: Unknown (05/18/2022)   [place] - Hydrologist (Medical): Patient refused    Lack of Transportation (Non-Medical): Patient refused  Physical Activity: Not on file  Stress: Not on file  Social Connections: Not on file   Additional Social History:                         Sleep: Fair  Appetite:  Fair  Current Medications: Current Facility-Administered Medications  Medication Dose Route Frequency Provider Last Rate Last Admin   acetaminophen (TYLENOL) tablet 650 mg  650 mg Oral Q6H PRN [name], NP       alum & mag hydroxide-simeth (MAALOX/MYLANTA) 200-200-20 MG/5ML suspension 30 mL  30 mL Oral Q4H PRN [name], NP   30 mL at 05/22/22 0816   cloZAPine (CLOZARIL) tablet 300 mg  300 mg Oral QHS Giordana Weinheimer T, MD   300 mg at 06/23/22 2129   docusate sodium (COLACE) capsule 200 mg  200 mg Oral BID Skarleth Delmonico T, MD   200 mg at 06/24/22 0757   famotidine (PEPCID) tablet 20 mg  20 mg Oral BID Mirage Pfefferkorn, [name], MD   20 mg at 06/24/22 0757   hydrOXYzine (ATARAX) tablet 50 mg  50 mg Oral Q6H PRN Polly Barner, [name], MD  50 mg at 05/17/22 0944   magnesium citrate solution 1 Bottle  1 Bottle Oral Daily PRN Wynelle Cleveland, RPH   1 Bottle at 06/23/22 1743   magnesium hydroxide (MILK OF MAGNESIA) suspension 30 mL  30 mL Oral Daily PRN Wynelle Cleveland, RPH       nicotine polacrilex (NICORETTE) gum 2 mg  2 mg Oral PRN Kendan Cornforth T, MD   2 mg at 04/23/22 2200   paliperidone (INVEGA SUSTENNA) injection 156 mg  156 mg Intramuscular Q28 days Gwendolin Briel, [name], MD   156 mg at 05/30/22 1100   senna-docusate (Senokot-S) tablet 2 tablet  2 tablet Oral Daily PRN Cyris Maalouf, [name], MD   2 tablet at 06/22/22 2141   ziprasidone (GEODON) injection 20 mg  20 mg Intramuscular Q12H PRN [name], MD        Lab Results: No results found for this or any previous visit (from the past 48 hour(s)).  Blood Alcohol level:  Lab Results   Component Value Date   ETH <10 0000000    Metabolic Disorder Labs: Lab Results  Component Value Date   HGBA1C 5.5 04/24/2022   MPG 111 04/24/2022   No results found for: "PROLACTIN" Lab Results  Component Value Date   CHOL 220 (H) 04/24/2022   TRIG 700 (H) 04/24/2022   HDL 35 (L) 04/24/2022   CHOLHDL 6.3 04/24/2022   VLDL UNABLE TO CALCULATE IF TRIGLYCERIDE OVER 400 mg/dL 04/24/2022   LDLCALC UNABLE TO CALCULATE IF TRIGLYCERIDE OVER 400 mg/dL 04/24/2022    Physical Findings: AIMS: Facial and Oral Movements Muscles of Facial Expression: None, normal Lips and Perioral Area: None, normal Jaw: None, normal Tongue: None, normal,Extremity Movements Upper (arms, wrists, hands, fingers): None, normal Lower (legs, knees, ankles, toes): None, normal, Trunk Movements Neck, shoulders, hips: None, normal, Overall Severity Severity of abnormal movements (highest score from questions above): None, normal Incapacitation due to abnormal movements: None, normal Patient's awareness of abnormal movements (rate only patient's report): No Awareness, Dental Status Current problems with teeth and/or dentures?: No Does patient usually wear dentures?: No  CIWA:    COWS:     Musculoskeletal: Strength & Muscle Tone: within normal limits Gait & Station: normal Patient leans: N/A  Psychiatric Specialty Exam:  Presentation  General Appearance:  Disheveled  Eye Contact: Minimal  Speech: Pressured  Speech Volume: Normal  Handedness: Right   Mood and Affect  Mood: Euphoric; Irritable  Affect: Inappropriate   Thought Process  Thought Processes: Disorganized  Descriptions of Associations:Loose  Orientation:Partial  Thought Content:Illogical; Scattered  History of Schizophrenia/Schizoaffective disorder:No data recorded Duration of Psychotic Symptoms:No data recorded Hallucinations:No data recorded Ideas of Reference:Delusions; Paranoia  Suicidal Thoughts:No data  recorded Homicidal Thoughts:No data recorded  Sensorium  Memory: Immediate Poor; Recent Poor; Remote Poor  Judgment: Poor  Insight: Poor   Executive Functions  Concentration: Fair  Attention Span: Fair  Recall: Poor  Fund of Knowledge: Poor  Language: Poor   Psychomotor Activity  Psychomotor Activity:No data recorded  Assets  Assets: Desire for Improvement; Social Support   Sleep  Sleep:No data recorded   Physical Exam: Physical Exam Vitals and nursing note reviewed.  Constitutional:      Appearance: Normal appearance.  HENT:     Head: Normocephalic and atraumatic.     Mouth/Throat:     Pharynx: Oropharynx is clear.  Eyes:     Pupils: Pupils are equal, round, and reactive to light.  Cardiovascular:     Rate and  Rhythm: Normal rate and regular rhythm.  Pulmonary:     Effort: Pulmonary effort is normal.     Breath sounds: Normal breath sounds.  Abdominal:     General: Abdomen is flat.     Palpations: Abdomen is soft.  Musculoskeletal:        General: Normal range of motion.  Skin:    General: Skin is warm and dry.  Neurological:     General: No focal deficit present.     Mental Status: He is alert. Mental status is at baseline.  Psychiatric:        Attention and Perception: He is inattentive.        Mood and Affect: Affect is blunt.        Speech: He is noncommunicative.        Behavior: Behavior is slowed.        Thought Content: Thought content is delusional.    Review of Systems  Constitutional: Negative.   HENT: Negative.    Eyes: Negative.   Respiratory: Negative.    Cardiovascular: Negative.   Gastrointestinal: Negative.   Musculoskeletal: Negative.   Skin: Negative.   Neurological: Negative.   Psychiatric/Behavioral: Negative.     Blood pressure 115/82, pulse 100, temperature 97.8 F (36.6 C), temperature source Oral, resp. rate 18, height '5\' 9"'$  (1.753 m), weight 96.4 kg, SpO2 98 %. Body mass index is 31.38  kg/m.   Treatment Plan Summary: Daily contact with patient to assess and evaluate symptoms and progress in treatment, Medication management, and Plan increasing Clozapine gently to see if there is any change in symptoms.  So far at least not over sedated I see that he is outdoors today participating with others which is a good sign.  Continue primarily with the treatment team to focus on discharge planning.  Alethia Berthold, MD 06/24/2022, 2:08 PM

## 2022-06-24 NOTE — Plan of Care (Signed)
Patient isolates to his room. Denies SI,HI and AVH. No issues verbalized. Patient went outside for some time today. Appetite and energy level good. Support and encouragement given.

## 2022-06-24 NOTE — Group Note (Signed)
Recreation Therapy Group Note   Group Topic:Self-Esteem  Group Date: 06/24/2022 Start Time: 1000 End Time: 1055 Facilitators: Vilma Prader, LRT, CTRS Location:  [facility]  Group Description: Positive Focus. Patients are given a handout that has 9 different boxes on it. Each box has a different prompt on it that requires you to identify and add something positive about themselves. LRT encourages pts to share two of their boxes to the group. LRT and pts discuss the importance of "thinking positive", self-esteem and how they can apply it to their everyday life post-discharge.  Affect/Mood: N/A   Participation Level: Did not attend    Clinical Observations/Individualized Feedback: Rahsean did not attend group due to resting in his room.  Plan: Continue to engage patient in RT group sessions 2-3x/week.   Vilma Prader, LRT, CTRS 06/24/2022 11:18 AM

## 2022-06-24 NOTE — Progress Notes (Signed)
D: Patient alert and oriented x 4, affect is flat but brightens upon approach, patient denies SI/HI/AVH. Patient is pleasant and cooperative, he appears less anxious and he is interacting with peers and staff appropriately.  A: Patient was offered support and encouragement, she was given scheduled medications and encouraged to attend groups. 15 minutes safety checks were done for safety.  R: Patient attends groups and interacts well with peers and staff. Patient is complaint with medication regimen. 15 minutes safety checks maintained will continue to monitor.

## 2022-06-25 DIAGNOSIS — F203 Undifferentiated schizophrenia: Secondary | ICD-10-CM | POA: Diagnosis not present

## 2022-06-25 NOTE — Plan of Care (Signed)
Patient verbalized that he feels constipated he only had a small BM today. Mag. Citrate given. Patient isolates in his room. Did not attend any group activities even with prompting. Denies SI,HI and AVH. Appetite and energy level good. Support and encouragement given.

## 2022-06-25 NOTE — Group Note (Signed)
LCSW Group Therapy Note  Group Date: 06/25/2022 Start Time: 1310 End Time: 1400   Type of Therapy and Topic:  Group Therapy: Positive Affirmations  Participation Level:  Did Not Attend   Description of Group:   This group addressed positive affirmation towards self and others.  Patients went around the room and identified two positive things about themselves and two positive things about a peer in the room.  Patients reflected on how it felt to share something positive with others, to identify positive things about themselves, and to hear positive things from others/ Patients were encouraged to have a daily reflection of positive characteristics or circumstances.   Therapeutic Goals: Patients will verbalize two of their positive qualities Patients will demonstrate empathy for others by stating two positive qualities about a peer in the group Patients will verbalize their feelings when voicing positive self affirmations and when voicing positive affirmations of others Patients will discuss the potential positive impact on their wellness/recovery of focusing on positive traits of self and others.  Summary of Patient Progress:   X  Therapeutic Modalities:   Cognitive Behavioral Therapy Motivational Interviewing    Rozann Lesches, [place] 06/25/2022  2:20 PM

## 2022-06-25 NOTE — Progress Notes (Signed)
[facility] MD Progress Note  06/25/2022 4:28 PM Travis Sandoval  MRN:  [id number] Subjective: No new complaint.  No change to behavior Principal Problem: Schizophrenia ([place]) Diagnosis: Principal Problem:   Schizophrenia ([place]) Active Problems:   Psychosis ([place])  Total Time spent with patient: 15 minutes  Past Psychiatric History: Schizophrenia  Past Medical History:  Past Medical History:  Diagnosis Date   ADHD    Bipolar 1 disorder ([place])    Hepatitis C    Schizoaffective disorder ([place])    History reviewed. No pertinent surgical history. Family History:  Family History  Family history unknown: Yes   Family Psychiatric  History: See above Social History:  Social History   Substance and Sexual Activity  Alcohol Use Not Currently     Social History   Substance and Sexual Activity  Drug Use Not Currently    Social History   Socioeconomic History   Marital status: Unknown    Spouse name: Not on file   Number of children: Not on file   Years of education: Not on file   Highest education level: Not on file  Occupational History   Not on file  Tobacco Use   Smoking status: Every Day    Packs/day: 0.25    Years: 15.00    Total pack years: 3.75    Types: Cigarettes   Smokeless tobacco: Not on file  Vaping Use   Vaping Use: Not on file  Substance and Sexual Activity   Alcohol use: Not Currently   Drug use: Not Currently   Sexual activity: Not Currently  Other Topics Concern   Not on file  Social History Narrative   Not on file   Social Determinants of Health   Financial Resource Strain: Not on file  Food Insecurity: Unknown (05/18/2022)   Hunger Vital Sign    Worried About Running Out of Food in the Last Year: Patient refused    [place] in the Last Year: Patient refused  Transportation Needs: Unknown (05/18/2022)   [place] - Hydrologist (Medical): Patient refused    Lack of Transportation (Non-Medical): Patient refused   Physical Activity: Not on file  Stress: Not on file  Social Connections: Not on file   Additional Social History:                         Sleep: Fair  Appetite:  Fair  Current Medications: Current Facility-Administered Medications  Medication Dose Route Frequency Provider Last Rate Last Admin   acetaminophen (TYLENOL) tablet 650 mg  650 mg Oral Q6H PRN [name], NP       alum & mag hydroxide-simeth (MAALOX/MYLANTA) 200-200-20 MG/5ML suspension 30 mL  30 mL Oral Q4H PRN [name], NP   30 mL at 05/22/22 0816   cloZAPine (CLOZARIL) tablet 300 mg  300 mg Oral QHS Tonjia Parillo T, MD   300 mg at 06/24/22 2114   docusate sodium (COLACE) capsule 200 mg  200 mg Oral BID [place] Leap T, MD   200 mg at 06/25/22 0759   famotidine (PEPCID) tablet 20 mg  20 mg Oral BID Claryce Friel T, MD   20 mg at 06/25/22 0759   hydrOXYzine (ATARAX) tablet 50 mg  50 mg Oral Q6H PRN Cache Bills, [name], MD   50 mg at 05/17/22 0944   magnesium citrate solution 1 Bottle  1 Bottle Oral Daily PRN Wynelle Cleveland, [facility]  1 Bottle at 06/25/22 1511   magnesium hydroxide (MILK OF MAGNESIA) suspension 30 mL  30 mL Oral Daily PRN Wynelle Cleveland, RPH   30 mL at 06/24/22 2116   nicotine polacrilex (NICORETTE) gum 2 mg  2 mg Oral PRN Lauranne Beyersdorf T, MD   2 mg at 04/23/22 2200   paliperidone (INVEGA SUSTENNA) injection 156 mg  156 mg Intramuscular Q28 days Alasia Enge, [name], MD   156 mg at 05/30/22 1100   senna-docusate (Senokot-S) tablet 2 tablet  2 tablet Oral Daily PRN Lacheryl Niesen, [name], MD   2 tablet at 06/22/22 2141   ziprasidone (GEODON) injection 20 mg  20 mg Intramuscular Q12H PRN [name], MD        Lab Results: No results found for this or any previous visit (from the past 48 hour(s)).  Blood Alcohol level:  Lab Results  Component Value Date   ETH <10 0000000    Metabolic Disorder Labs: Lab Results  Component Value Date   HGBA1C 5.5 04/24/2022   MPG 111 04/24/2022    No results found for: "PROLACTIN" Lab Results  Component Value Date   CHOL 220 (H) 04/24/2022   TRIG 700 (H) 04/24/2022   HDL 35 (L) 04/24/2022   CHOLHDL 6.3 04/24/2022   VLDL UNABLE TO CALCULATE IF TRIGLYCERIDE OVER 400 mg/dL 04/24/2022   LDLCALC UNABLE TO CALCULATE IF TRIGLYCERIDE OVER 400 mg/dL 04/24/2022    Physical Findings: AIMS: Facial and Oral Movements Muscles of Facial Expression: None, normal Lips and Perioral Area: None, normal Jaw: None, normal Tongue: None, normal,Extremity Movements Upper (arms, wrists, hands, fingers): None, normal Lower (legs, knees, ankles, toes): None, normal, Trunk Movements Neck, shoulders, hips: None, normal, Overall Severity Severity of abnormal movements (highest score from questions above): None, normal Incapacitation due to abnormal movements: None, normal Patient's awareness of abnormal movements (rate only patient's report): No Awareness, Dental Status Current problems with teeth and/or dentures?: No Does patient usually wear dentures?: No  CIWA:    COWS:     Musculoskeletal: Strength & Muscle Tone: within normal limits Gait & Station: normal Patient leans: N/A  Psychiatric Specialty Exam:  Presentation  General Appearance:  Disheveled  Eye Contact: Minimal  Speech: Pressured  Speech Volume: Normal  Handedness: Right   Mood and Affect  Mood: Euphoric; Irritable  Affect: Inappropriate   Thought Process  Thought Processes: Disorganized  Descriptions of Associations:Loose  Orientation:Partial  Thought Content:Illogical; Scattered  History of Schizophrenia/Schizoaffective disorder:No data recorded Duration of Psychotic Symptoms:No data recorded Hallucinations:No data recorded Ideas of Reference:Delusions; Paranoia  Suicidal Thoughts:No data recorded Homicidal Thoughts:No data recorded  Sensorium  Memory: Immediate Poor; Recent Poor; Remote  Poor  Judgment: Poor  Insight: Poor   Executive Functions  Concentration: Fair  Attention Span: Fair  Recall: Poor  Fund of Knowledge: Poor  Language: Poor   Psychomotor Activity  Psychomotor Activity:No data recorded  Assets  Assets: Desire for Improvement; Social Support   Sleep  Sleep:No data recorded   Physical Exam: Physical Exam Vitals and nursing note reviewed.  Constitutional:      Appearance: Normal appearance.  HENT:     Head: Normocephalic and atraumatic.     Mouth/Throat:     Pharynx: Oropharynx is clear.  Eyes:     Pupils: Pupils are equal, round, and reactive to light.  Cardiovascular:     Rate and Rhythm: Normal rate and regular rhythm.  Pulmonary:     Effort: Pulmonary effort is normal.  Breath sounds: Normal breath sounds.  Abdominal:     General: Abdomen is flat.     Palpations: Abdomen is soft.  Musculoskeletal:        General: Normal range of motion.  Skin:    General: Skin is warm and dry.  Neurological:     General: No focal deficit present.     Mental Status: He is alert. Mental status is at baseline.  Psychiatric:        Mood and Affect: Mood normal.        Thought Content: Thought content normal.    Review of Systems  Constitutional: Negative.   HENT: Negative.    Eyes: Negative.   Respiratory: Negative.    Cardiovascular: Negative.   Gastrointestinal: Negative.   Musculoskeletal: Negative.   Skin: Negative.   Neurological: Negative.   Psychiatric/Behavioral: Negative.     Blood pressure 113/69, pulse (!) 105, temperature 98.2 F (36.8 C), temperature source Oral, resp. rate 18, height '5\' 9"'$  (1.753 m), weight 96.4 kg, SpO2 96 %. Body mass index is 31.38 kg/m.   Treatment Plan Summary: Medication management and Plan tolerating increase in clozapine.  Will continue to monitor and consider further increases in a few days.  Mainly consider placement.  Alethia Berthold, MD 06/25/2022, 4:28 PM

## 2022-06-25 NOTE — [facility] Group Notes (Signed)
[facility] Group Notes:  (Nursing/MHT/Case Management/Adjunct)  Date:  06/25/2022  Time:  9:54 AM  Type of Therapy:   community meeting  Participation Level:  Did Not Attend    Antonieta Pert 06/25/2022, 9:54 AM

## 2022-06-26 DIAGNOSIS — F203 Undifferentiated schizophrenia: Secondary | ICD-10-CM | POA: Diagnosis not present

## 2022-06-26 LAB — CBC WITH DIFFERENTIAL/PLATELET
Abs Immature Granulocytes: 0.02 10*3/uL (ref 0.00–0.07)
Basophils Absolute: 0.1 10*3/uL (ref 0.0–0.1)
Basophils Relative: 1 %
Eosinophils Absolute: 0.2 10*3/uL (ref 0.0–0.5)
Eosinophils Relative: 2 %
HCT: 40.7 % (ref 39.0–52.0)
Hemoglobin: 13.5 g/dL (ref 13.0–17.0)
Immature Granulocytes: 0 %
Lymphocytes Relative: 40 %
Lymphs Abs: 3.3 10*3/uL (ref 0.7–4.0)
MCH: 29.7 pg (ref 26.0–34.0)
MCHC: 33.2 g/dL (ref 30.0–36.0)
MCV: 89.5 fL (ref 80.0–100.0)
Monocytes Absolute: 0.5 10*3/uL (ref 0.1–1.0)
Monocytes Relative: 6 %
Neutro Abs: 4.3 10*3/uL (ref 1.7–7.7)
Neutrophils Relative %: 51 %
Platelets: 238 10*3/uL (ref 150–400)
RBC: 4.55 MIL/uL (ref 4.22–5.81)
RDW: 12.2 % (ref 11.5–15.5)
WBC: 8.4 10*3/uL (ref 4.0–10.5)
nRBC: 0 % (ref 0.0–0.2)

## 2022-06-26 NOTE — Group Note (Signed)
Recreation Therapy Group Note   Group Topic:Other  Group Date: 06/26/2022 Start Time: 1000 End Time: 1001 Facilitators: Vilma Prader, LRT, CTRS Location:  N/A  Group was not held due to LRT attending Fit Testing Trainer Class.    Vilma Prader, LRT, CTRS 06/26/2022 11:32 AM

## 2022-06-26 NOTE — Consult Note (Signed)
Pharmacy Consult - Clozapine     42 yo male ordered clozapine 100 mg PO daily  This patient's order has been reviewed for prescribing contraindications.    Clozapine REMS enrollment Verified: yes on 04/17/22 [facility] patient ID: [id number] Current Outpatient Monitoring: Every 2 weeks   Home Regimen:  225 mg po BID Last dose: unknown   Dose Adjustments This Admission: Dose started at clozapine 100 mg po daily Dose is currently clozapine 300 mg po HS (started 3/3)   Labs: Date    ANC    Submitted? 12/27 2800 Yes 01/03 3500 Yes 01/10   3100 Yes 01/24 3000 Yes 01/31 4400 yes   02/07 4300 yes 02/14 4500 Yes 02/21 5500 Yes 02/28 4080 Yes 03/06 4300 Yes   Plan: Continue clozapine 300 mg po HS Monitor ANC at least weekly while inpatient    Aubery Lapping, PharmD 06/26/2022 7:30 AM

## 2022-06-26 NOTE — Progress Notes (Signed)
Patient compliant with medications interacting well with Peers and Staff denies SI/HI/AH and verbally contracted for safety, Q 15 minutes safety checks ongoing. Patient remains safe.

## 2022-06-26 NOTE — Group Note (Signed)
[facility] LCSW Group Therapy Note   Group Date: 06/26/2022 Start Time: 1300 End Time: 1400   Type of Therapy/Topic:  Group Therapy:  Emotion Regulation  Participation Level:  Did Not Attend   Mood:  Description of Group:    The purpose of this group is to assist patients in learning to regulate negative emotions and experience positive emotions. Patients will be guided to discuss ways in which they have been vulnerable to their negative emotions. These vulnerabilities will be juxtaposed with experiences of positive emotions or situations, and patients challenged to use positive emotions to combat negative ones. Special emphasis will be placed on coping with negative emotions in conflict situations, and patients will process healthy conflict resolution skills.  Therapeutic Goals: Patient will identify two positive emotions or experiences to reflect on in order to balance out negative emotions:  Patient will label two or more emotions that they find the most difficult to experience:  Patient will be able to demonstrate positive conflict resolution skills through discussion or role plays:   Summary of Patient Progress: X   Therapeutic Modalities:   Cognitive Behavioral Therapy Feelings Identification Dialectical Behavioral Therapy   Shirl Harris, LCSW

## 2022-06-26 NOTE — Group Note (Signed)
Date:  06/26/2022 Time:  11:54 PM  Group Topic/Focus:  Making Healthy Choices:   The focus of this group is to help patients identify negative/unhealthy choices they were using prior to admission and identify positive/healthier coping strategies to replace them upon discharge. Wrap-Up Group:   The focus of this group is to help patients review their daily goal of treatment and discuss progress on daily workbooks.    Participation Level:  Did Not Attend  Participation Quality:    Affect:    Cognitive:    Insight:   Engagement in Group:    Modes of Intervention:    Additional Comments:  did not attend   Ladona Mow 06/26/2022, 11:54 PM

## 2022-06-26 NOTE — Plan of Care (Signed)
D- Patient alert and oriented. Pt reports good mood, flat affect noted. Denies SI, HI, AVH, and pain.  Pt did not voice goals at this time for today.  Per yesterday's note pt isolated in his room and remained fixated on having a complete BM.  Today pt has been noted walking on unit and no distress voiced/observed in regards to BM. A- Scheduled medications administered to patient, per MD orders. Support and encouragement provided.  Routine safety checks conducted every 15 minutes.  Patient informed to notify staff with problems or concerns. R- No adverse drug reactions noted. Patient contracts for safety at this time. Patient compliant with medications and treatment plan. Patient receptive, calm, and cooperative. Patient interacts well with others on the unit.  Patient remains safe at this time.  Problem: Education: Goal: Knowledge of General Education information will improve Description: Including pain rating scale, medication(s)/side effects and non-pharmacologic comfort measures Outcome: Progressing   Problem: Health Behavior/Discharge Planning: Goal: Ability to manage health-related needs will improve Outcome: Progressing   Problem: Clinical Measurements: Goal: Ability to maintain clinical measurements within normal limits will improve Outcome: Progressing Goal: Will remain free from infection Outcome: Progressing Goal: Diagnostic test results will improve Outcome: Progressing Goal: Respiratory complications will improve Outcome: Progressing Goal: Cardiovascular complication will be avoided Outcome: Progressing   Problem: Activity: Goal: Risk for activity intolerance will decrease Outcome: Progressing   Problem: Nutrition: Goal: Adequate nutrition will be maintained Outcome: Progressing   Problem: Coping: Goal: Level of anxiety will decrease Outcome: Progressing   Problem: Elimination: Goal: Will not experience complications related to urinary retention Outcome:  Progressing   Problem: Pain Managment: Goal: General experience of comfort will improve Outcome: Progressing   Problem: Safety: Goal: Ability to remain free from injury will improve Outcome: Progressing   Problem: Skin Integrity: Goal: Risk for impaired skin integrity will decrease Outcome: Progressing   Problem: Education: Goal: Knowledge of [place] General Education information/materials will improve Outcome: Progressing Goal: Emotional status will improve Outcome: Progressing Goal: Mental status will improve Outcome: Progressing Goal: Verbalization of understanding the information provided will improve Outcome: Progressing   Problem: Activity: Goal: Interest or engagement in activities will improve Outcome: Progressing Goal: Sleeping patterns will improve Outcome: Progressing   Problem: Coping: Goal: Ability to verbalize frustrations and anger appropriately will improve Outcome: Progressing Goal: Ability to demonstrate self-control will improve Outcome: Progressing   Problem: Health Behavior/Discharge Planning: Goal: Identification of resources available to assist in meeting health care needs will improve Outcome: Progressing Goal: Compliance with treatment plan for underlying cause of condition will improve Outcome: Progressing   Problem: Physical Regulation: Goal: Ability to maintain clinical measurements within normal limits will improve Outcome: Progressing   Problem: Safety: Goal: Periods of time without injury will increase Outcome: Progressing   Problem: Activity: Goal: Will verbalize the importance of balancing activity with adequate rest periods Outcome: Progressing   Problem: Education: Goal: Will be free of psychotic symptoms Outcome: Progressing Goal: Knowledge of the prescribed therapeutic regimen will improve Outcome: Progressing   Problem: Coping: Goal: Coping ability will improve Outcome: Progressing Goal: Will verbalize  feelings Outcome: Progressing   Problem: Health Behavior/Discharge Planning: Goal: Compliance with prescribed medication regimen will improve Outcome: Progressing   Problem: Nutritional: Goal: Ability to achieve adequate nutritional intake will improve Outcome: Progressing   Problem: Role Relationship: Goal: Ability to communicate needs accurately will improve Outcome: Progressing Goal: Ability to interact with others will improve Outcome: Progressing   Problem: Safety: Goal: Ability to redirect hostility  and anger into socially appropriate behaviors will improve Outcome: Progressing Goal: Ability to remain free from injury will improve Outcome: Progressing   Problem: Self-Care: Goal: Ability to participate in self-care as condition permits will improve Outcome: Progressing   Problem: Self-Concept: Goal: Will verbalize positive feelings about self Outcome: Progressing

## 2022-06-26 NOTE — Plan of Care (Signed)
Pt denies anxiety/depression at this time. Pt denies SI/HI/AVH or pain at this time. Pt is calm and cooperative. Pt is medication compliant. Pt provided with support and encouragement. Pt monitored q15 minutes for safety per unit policy. Plan of care ongoing.   Problem: Nutrition: Goal: Adequate nutrition will be maintained Outcome: Progressing   Problem: Coping: Goal: Level of anxiety will decrease Outcome: Progressing

## 2022-06-26 NOTE — BH IP Treatment Plan (Signed)
Interdisciplinary Treatment and Diagnostic Plan Update  06/26/2022 Time of Session: 8:30AM Travis Sandoval MRN: [id number]  Principal Diagnosis: Schizophrenia [facility])  Secondary Diagnoses: Principal Problem:   Schizophrenia ([place]) Active Problems:   Psychosis ([place])   Current Medications:  Current Facility-Administered Medications  Medication Dose Route Frequency Provider Last Rate Last Admin   acetaminophen (TYLENOL) tablet 650 mg  650 mg Oral Q6H PRN [name], NP       alum & mag hydroxide-simeth (MAALOX/MYLANTA) 200-200-20 MG/5ML suspension 30 mL  30 mL Oral Q4H PRN [name], NP   30 mL at 05/22/22 0816   cloZAPine (CLOZARIL) tablet 300 mg  300 mg Oral QHS [name], [name], MD   300 mg at 06/25/22 2110   docusate sodium (COLACE) capsule 200 mg  200 mg Oral BID [name], [name], MD   200 mg at 06/26/22 [id number]   famotidine (PEPCID) tablet 20 mg  20 mg Oral BID [name], [name], MD   20 mg at 06/26/22 [id number]   hydrOXYzine (ATARAX) tablet 50 mg  50 mg Oral Q6H PRN [name], [name], MD   50 mg at 05/17/22 0944   magnesium citrate solution 1 Bottle  1 Bottle Oral Daily PRN Wynelle Cleveland, RPH   1 Bottle at 06/25/22 1511   magnesium hydroxide (MILK OF MAGNESIA) suspension 30 mL  30 mL Oral Daily PRN Wynelle Cleveland, RPH   30 mL at 06/24/22 2116   nicotine polacrilex (NICORETTE) gum 2 mg  2 mg Oral PRN [name], [name], MD   2 mg at 04/23/22 2200   paliperidone (INVEGA SUSTENNA) injection 156 mg  156 mg Intramuscular Q28 days [name], [name], MD   156 mg at 05/30/22 1100   senna-docusate (Senokot-S) tablet 2 tablet  2 tablet Oral Daily PRN [name], [name], MD   2 tablet at 06/22/22 2141   ziprasidone (GEODON) injection 20 mg  20 mg Intramuscular Q12H PRN [name], MD       PTA Medications: Medications Prior to Admission  Medication Sig Dispense Refill Last Dose   ABILIFY MAINTENA 400 MG SRER injection Inject 400 mg into the muscle every 28 (twenty-eight) days.       ARIPiprazole (ABILIFY) 10 MG tablet Take 10 mg by mouth daily. (Patient not taking: Reported on 04/16/2022)      atomoxetine (STRATTERA) 40 MG capsule Take 40 mg by mouth daily. (Patient not taking: Reported on 04/16/2022)      atorvastatin (LIPITOR) 40 MG tablet Take 40 mg by mouth daily.      budesonide-formoterol (SYMBICORT) 160-4.5 MCG/ACT inhaler Inhale 2 puffs into the lungs.      cetirizine (ZYRTEC) 10 MG tablet Take 10 mg by mouth daily.      clozapine (CLOZARIL) 200 MG tablet Take 200 mg by mouth 2 (two) times daily. Take along with one 25 mg tablet for total 225 mg twice daily      cloZAPine (CLOZARIL) 25 MG tablet Take 25 mg by mouth 2 (two) times daily. Take along with one 200 mg tablet for total 225 mg twice daily      Dexlansoprazole (DEXILANT) 30 MG capsule Take 30 mg by mouth daily. (Patient not taking: Reported on 04/16/2022)      hydrOXYzine (VISTARIL) 50 MG capsule Take 50 mg by mouth 3 (three) times daily as needed for itching. (Patient not taking: Reported on 04/16/2022)      Melatonin 5 MG TABS Take 5 mg by mouth at bedtime as  needed (sleep).      metoprolol succinate (TOPROL-XL) 25 MG 24 hr tablet Take 12.5 mg by mouth daily.      Phosphatidyl Choline 65 MG TABS Take 1 tablet by mouth daily.  (Patient not taking: Reported on 04/16/2022)      valproic acid (DEPAKENE) 250 MG/5ML solution Take 750-1,000 mLs by mouth 2 (two) times daily. 1000 mg (20 mL) every morning and 750 mg (15 mL) daily at bedtime       Patient Stressors: Other: Psychosis    Patient Strengths: Ability for insight  Active sense of humor  Average or above average intelligence  Capable of independent living  Supportive family/friends   Treatment Modalities: Medication Management, Group therapy, Case management,  1 to 1 session with clinician, Psychoeducation, Recreational therapy.   Physician Treatment Plan for Primary Diagnosis: Schizophrenia ([name]) Long Term Goal(s):     Short Term Goals: None, pt  is a poor hx.  Medication Management: Evaluate patient's response, side effects, and tolerance of medication regimen.  Therapeutic Interventions: 1 to 1 sessions, Unit Group sessions and Medication administration.  Evaluation of Outcomes: Adequate for Discharge  Physician Treatment Plan for Secondary Diagnosis: Principal Problem:   Schizophrenia ([name]) Active Problems:   Psychosis ([name])  Long Term Goal(s):     Short Term Goals: None, pt is a poor hx.     Medication Management: Evaluate patient's response, side effects, and tolerance of medication regimen.  Therapeutic Interventions: 1 to 1 sessions, Unit Group sessions and Medication administration.  Evaluation of Outcomes: Adequate for Discharge   RN Treatment Plan for Primary Diagnosis: Schizophrenia ([name]) Long Term Goal(s): Knowledge of disease and therapeutic regimen to maintain health will improve  Short Term Goals: Ability to demonstrate self-control, Ability to participate in decision making will improve, Ability to verbalize feelings will improve, Ability to disclose and discuss suicidal ideas, Ability to identify and develop effective coping behaviors will improve, and Compliance with prescribed medications will improve  Medication Management: RN will administer medications as ordered by provider, will assess and evaluate patient's response and provide education to patient for prescribed medication. RN will report any adverse and/or side effects to prescribing provider.  Therapeutic Interventions: 1 on 1 counseling sessions, Psychoeducation, Medication administration, Evaluate responses to treatment, Monitor vital signs and CBGs as ordered, Perform/monitor CIWA, COWS, AIMS and Fall Risk screenings as ordered, Perform wound care treatments as ordered.  Evaluation of Outcomes: Adequate for Discharge   LCSW Treatment Plan for Primary Diagnosis: Schizophrenia [name]) Long Term Goal(s): Safe transition to appropriate next level  of care at discharge, Engage patient in therapeutic group addressing interpersonal concerns.  Short Term Goals: Engage patient in aftercare planning with referrals and resources, Increase social support, Increase ability to appropriately verbalize feelings, Increase emotional regulation, Facilitate acceptance of mental health diagnosis and concerns, and Increase skills for wellness and recovery  Therapeutic Interventions: Assess for all discharge needs, 1 to 1 time with Social worker, Explore available resources and support systems, Assess for adequacy in community support network, Educate family and significant other(s) on suicide prevention, Complete Psychosocial Assessment, Interpersonal group therapy.  Evaluation of Outcomes: Adequate for Discharge   Progress in Treatment: Attending groups: No. Participating in groups: No. Taking medication as prescribed: Yes. Toleration medication: Yes. Family/Significant other contact made: Yes, individual(s) contacted:  SPE completed with the patient's father.  Patient understands diagnosis: No. Discussing patient identified problems/goals with staff: No. Medical problems stabilized or resolved: Yes. Denies suicidal/homicidal ideation: Yes. Issues/concerns per patient self-inventory: No.  Other: none  New problem(s) identified: No, Describe:  none  New Short Term/Long Term Goal(s): elimination of symptoms of psychosis, medication management for mood stabilization; elimination of SI thoughts; development of comprehensive mental wellness plan.   Patient Goals:  No additional treatment goals identified by patient.   Discharge Plan or Barriers: Patient remains on the unit as placement continues to be sought. Funding remains the concern at this time and biggest barrier to placement at this time.   Reason for Continuation of Hospitalization: Anxiety Delusions  Depression Hallucinations  Estimated Length of Stay:    Last 3 [place] Suicide  Severity Risk Score: Flowsheet Row Admission (Current) from 04/16/2022 in [place] ED from 04/15/2022 in [name] [place] No Risk No Risk       Last PHQ 2/9 Scores:     No data to display          Scribe for Treatment Team: Rozann Lesches, LCSW 06/26/2022 1:40 PM

## 2022-06-26 NOTE — Progress Notes (Signed)
[facility] MD Progress Note  06/26/2022 11:40 AM Travis Sandoval  MRN:  [id number] Subjective: Follow-up patient with schizophrenia.  No change presentation.  As always stays in bed most of the day but is awake and conversant and denies any symptoms.  Vital stable no evident health problems.  Tolerating medicine well. Principal Problem: Schizophrenia ([place]) Diagnosis: Principal Problem:   Schizophrenia ([place]) Active Problems:   Psychosis ([place])  Total Time spent with patient: 30 minutes  Past Psychiatric History: Past history of schizophrenia on clozapine  Past Medical History:  Past Medical History:  Diagnosis Date   ADHD    Bipolar 1 disorder ([place])    Hepatitis C    Schizoaffective disorder ([place])    History reviewed. No pertinent surgical history. Family History:  Family History  Family history unknown: Yes   Family Psychiatric  History: See previous Social History:  Social History   Substance and Sexual Activity  Alcohol Use Not Currently     Social History   Substance and Sexual Activity  Drug Use Not Currently    Social History   Socioeconomic History   Marital status: Unknown    Spouse name: Not on file   Number of children: Not on file   Years of education: Not on file   Highest education level: Not on file  Occupational History   Not on file  Tobacco Use   Smoking status: Every Day    Packs/day: 0.25    Years: 15.00    Total pack years: 3.75    Types: Cigarettes   Smokeless tobacco: Not on file  Vaping Use   Vaping Use: Not on file  Substance and Sexual Activity   Alcohol use: Not Currently   Drug use: Not Currently   Sexual activity: Not Currently  Other Topics Concern   Not on file  Social History Narrative   Not on file   Social Determinants of Health   Financial Resource Strain: Not on file  Food Insecurity: Unknown (05/18/2022)   Hunger Vital Sign    Worried About Running Out of Food in the Last Year: Patient refused    [place] in the  Last Year: Patient refused  Transportation Needs: Unknown (05/18/2022)   [place] - Hydrologist (Medical): Patient refused    Lack of Transportation (Non-Medical): Patient refused  Physical Activity: Not on file  Stress: Not on file  Social Connections: Not on file   Additional Social History:                         Sleep: Fair  Appetite:  Fair  Current Medications: Current Facility-Administered Medications  Medication Dose Route Frequency Provider Last Rate Last Admin   acetaminophen (TYLENOL) tablet 650 mg  650 mg Oral Q6H PRN [name], NP       alum & mag hydroxide-simeth (MAALOX/MYLANTA) 200-200-20 MG/5ML suspension 30 mL  30 mL Oral Q4H PRN [name], NP   30 mL at 05/22/22 0816   cloZAPine (CLOZARIL) tablet 300 mg  300 mg Oral QHS Armine Rizzolo T, MD   300 mg at 06/25/22 2110   docusate sodium (COLACE) capsule 200 mg  200 mg Oral BID Renleigh Ouellet T, MD   200 mg at 06/26/22 [id number]   famotidine (PEPCID) tablet 20 mg  20 mg Oral BID Briana Newman, [name], MD   20 mg at 06/26/22 [id number]   hydrOXYzine (ATARAX) tablet 50 mg  50 mg Oral Q6H PRN Alanda Colton, [name], MD   50 mg at 05/17/22 0944   magnesium citrate solution 1 Bottle  1 Bottle Oral Daily PRN Wynelle Cleveland, RPH   1 Bottle at 06/25/22 1511   magnesium hydroxide (MILK OF MAGNESIA) suspension 30 mL  30 mL Oral Daily PRN Wynelle Cleveland, RPH   30 mL at 06/24/22 2116   nicotine polacrilex (NICORETTE) gum 2 mg  2 mg Oral PRN Edy Belt, [name], MD   2 mg at 04/23/22 2200   paliperidone (INVEGA SUSTENNA) injection 156 mg  156 mg Intramuscular Q28 days Shamere Campas, [name], MD   156 mg at 05/30/22 1100   senna-docusate (Senokot-S) tablet 2 tablet  2 tablet Oral Daily PRN Tritia Endo, [name], MD   2 tablet at 06/22/22 2141   ziprasidone (GEODON) injection 20 mg  20 mg Intramuscular Q12H PRN [name], MD        Lab Results:  Results for orders placed or performed during the hospital  encounter of 04/16/22 (from the past 48 hour(s))  CBC with Differential/Platelet     Status: None   Collection Time: 06/26/22  6:45 AM  Result Value Ref Range   WBC 8.4 4.0 - 10.5 K/uL   RBC 4.55 4.22 - 5.81 MIL/uL   Hemoglobin 13.5 13.0 - 17.0 g/dL   HCT 40.7 39.0 - 52.0 %   MCV 89.5 80.0 - 100.0 fL   MCH 29.7 26.0 - 34.0 pg   MCHC 33.2 30.0 - 36.0 g/dL   RDW 12.2 11.5 - 15.5 %   Platelets 238 150 - 400 K/uL   nRBC 0.0 0.0 - 0.2 %   Neutrophils Relative % 51 %   Neutro Abs 4.3 1.7 - 7.7 K/uL   Lymphocytes Relative 40 %   Lymphs Abs 3.3 0.7 - 4.0 K/uL   Monocytes Relative 6 %   Monocytes Absolute 0.5 0.1 - 1.0 K/uL   Eosinophils Relative 2 %   Eosinophils Absolute 0.2 0.0 - 0.5 K/uL   Basophils Relative 1 %   Basophils Absolute 0.1 0.0 - 0.1 K/uL   Immature Granulocytes 0 %   Abs Immature Granulocytes 0.02 0.00 - 0.07 K/uL    Comment: Performed at [facility], [address] [place], [place] [address]    Blood Alcohol level:  Lab Results  Component Value Date   [facility] <10 0000000    Metabolic Disorder Labs: Lab Results  Component Value Date   HGBA1C 5.5 04/24/2022   MPG 111 04/24/2022   No results found for: "PROLACTIN" Lab Results  Component Value Date   CHOL 220 (H) 04/24/2022   TRIG 700 (H) 04/24/2022   HDL 35 (L) 04/24/2022   CHOLHDL 6.3 04/24/2022   VLDL UNABLE TO CALCULATE IF TRIGLYCERIDE OVER 400 mg/dL 04/24/2022   LDLCALC UNABLE TO CALCULATE IF TRIGLYCERIDE OVER 400 mg/dL 04/24/2022    Physical Findings: AIMS: Facial and Oral Movements Muscles of Facial Expression: None, normal Lips and Perioral Area: None, normal Jaw: None, normal Tongue: None, normal,Extremity Movements Upper (arms, wrists, hands, fingers): None, normal Lower (legs, knees, ankles, toes): None, normal, Trunk Movements Neck, shoulders, hips: None, normal, Overall Severity Severity of abnormal movements (highest score from questions above): None, normal Incapacitation  due to abnormal movements: None, normal Patient's awareness of abnormal movements (rate only patient's report): No Awareness, Dental Status Current problems with teeth and/or dentures?: No Does patient usually wear dentures?: No  CIWA:    COWS:  Musculoskeletal: Strength & Muscle Tone: within normal limits Gait & Station: normal Patient leans: N/A  Psychiatric Specialty Exam:  Presentation  General Appearance:  Disheveled  Eye Contact: Minimal  Speech: Pressured  Speech Volume: Normal  Handedness: Right   Mood and Affect  Mood: Euphoric; Irritable  Affect: Inappropriate   Thought Process  Thought Processes: Disorganized  Descriptions of Associations:Loose  Orientation:Partial  Thought Content:Illogical; Scattered  History of Schizophrenia/Schizoaffective disorder:No data recorded Duration of Psychotic Symptoms:No data recorded Hallucinations:No data recorded Ideas of Reference:Delusions; Paranoia  Suicidal Thoughts:No data recorded Homicidal Thoughts:No data recorded  Sensorium  Memory: Immediate Poor; Recent Poor; Remote Poor  Judgment: Poor  Insight: Poor   Executive Functions  Concentration: Fair  Attention Span: Fair  Recall: Poor  Fund of Knowledge: Poor  Language: Poor   Psychomotor Activity  Psychomotor Activity:No data recorded  Assets  Assets: Desire for Improvement; Social Support   Sleep  Sleep:No data recorded   Physical Exam: Physical Exam Vitals and nursing note reviewed.  Constitutional:      Appearance: Normal appearance.  HENT:     Head: Normocephalic and atraumatic.     Mouth/Throat:     Pharynx: Oropharynx is clear.  Eyes:     Pupils: Pupils are equal, round, and reactive to light.  Cardiovascular:     Rate and Rhythm: Normal rate and regular rhythm.  Pulmonary:     Effort: Pulmonary effort is normal.     Breath sounds: Normal breath sounds.  Abdominal:     General: Abdomen is flat.      Palpations: Abdomen is soft.  Musculoskeletal:        General: Normal range of motion.  Skin:    General: Skin is warm and dry.  Neurological:     General: No focal deficit present.     Mental Status: He is alert. Mental status is at baseline.  Psychiatric:        Attention and Perception: He is inattentive.        Mood and Affect: Mood normal. Affect is blunt.        Speech: Speech is delayed.        Behavior: Behavior is slowed.        Thought Content: Thought content normal.    Review of Systems  Constitutional: Negative.   HENT: Negative.    Eyes: Negative.   Respiratory: Negative.    Cardiovascular: Negative.   Gastrointestinal: Negative.   Musculoskeletal: Negative.   Skin: Negative.   Neurological: Negative.   Psychiatric/Behavioral: Negative.     Blood pressure 121/87, pulse (!) 103, temperature 98 F (36.7 C), temperature source Oral, resp. rate 18, height '5\' 9"'$  (1.753 m), weight 96.4 kg, SpO2 96 %. Body mass index is 31.38 kg/m.   Treatment Plan Summary: Medication management and Plan may gradually continue increasing Clozapine but at this point he seems to be unchanged not dangerous and are active plan is primarily on placement.  Alethia Berthold, MD 06/26/2022, 11:40 AM

## 2022-06-27 DIAGNOSIS — F203 Undifferentiated schizophrenia: Secondary | ICD-10-CM | POA: Diagnosis not present

## 2022-06-27 MED ORDER — ARIPIPRAZOLE ER 400 MG IM SRER
400.0000 mg | INTRAMUSCULAR | Status: DC
  Administered 2022-06-27 – 2022-09-19 (×4): 400 mg via INTRAMUSCULAR
  Filled 2022-06-27 (×4): qty 2

## 2022-06-27 NOTE — Group Note (Signed)
Recreation Therapy Group Note   Group Topic:Emotion Expression  Group Date: 06/27/2022 Start Time: 1000 End Time: 1100 Facilitators: Vilma Prader, LRT, CTRS Location:  Craft Room  Group Description: Gratitude Journaling. Patients and LRT discussed what gratitude means, how we can express it and what it means to [place], personally. LRT gave an education handout on the definition of gratitude that also gave different examples of gratitude exercises that they could try. One of the examples was "Gratitude Letter", which prompted you to write a letter to someone you appreciate. LRT played soft music while everyone wrote their letter. Once letter was completed, LRT encouraged people to read their letter, if they wanted to, or share who they wrote it to, at minimum. LRT and pts processed how showing gratitude towards themselves, and others can be applied to everyday life post-discharge.   Affect/Mood: N/A   Participation Level: Did not attend    Clinical Observations/Individualized Feedback: Travis Sandoval did not attend group due to resting in his room.   Plan: Continue to engage patient in RT group sessions 2-3x/week.   Vilma Prader, LRT, CTRS 06/27/2022 11:34 AM

## 2022-06-27 NOTE — Progress Notes (Signed)
Patient stated that he prefers to take [place], instead of [place], being that this is the medication he was on before. MD was notified during rounds.

## 2022-06-27 NOTE — Progress Notes (Signed)
[facility] MD Progress Note  06/27/2022 2:04 PM Travis Sandoval  MRN:  [id number] Subjective: Patient seen for follow-up.  He was due to get his long-acting Invega injection today but declined it.  He had gotten it last month and done fine with it but he tells me today that he wants to go back to the Abilify shot.  Otherwise no complaints no behavior problems no change from his baseline. Principal Problem: Schizophrenia ([place]) Diagnosis: Principal Problem:   Schizophrenia ([place]) Active Problems:   Psychosis ([place])  Total Time spent with patient: 30 minutes  Past Psychiatric History: Past history of schizophrenia on clozapine  Past Medical History:  Past Medical History:  Diagnosis Date   ADHD    Bipolar 1 disorder ([place])    Hepatitis C    Schizoaffective disorder ([place])    History reviewed. No pertinent surgical history. Family History:  Family History  Family history unknown: Yes   Family Psychiatric  History: See previous Social History:  Social History   Substance and Sexual Activity  Alcohol Use Not Currently     Social History   Substance and Sexual Activity  Drug Use Not Currently    Social History   Socioeconomic History   Marital status: Unknown    Spouse name: Not on file   Number of children: Not on file   Years of education: Not on file   Highest education level: Not on file  Occupational History   Not on file  Tobacco Use   Smoking status: Every Day    Packs/day: 0.25    Years: 15.00    Total pack years: 3.75    Types: Cigarettes   Smokeless tobacco: Not on file  Vaping Use   Vaping Use: Not on file  Substance and Sexual Activity   Alcohol use: Not Currently   Drug use: Not Currently   Sexual activity: Not Currently  Other Topics Concern   Not on file  Social History Narrative   Not on file   Social Determinants of Health   Financial Resource Strain: Not on file  Food Insecurity: Unknown (05/18/2022)   Hunger Vital Sign    Worried About Running Out  of Food in the Last Year: Patient refused    [place] in the Last Year: Patient refused  Transportation Needs: Unknown (05/18/2022)   [place] - Hydrologist (Medical): Patient refused    Lack of Transportation (Non-Medical): Patient refused  Physical Activity: Not on file  Stress: Not on file  Social Connections: Not on file   Additional Social History:                         Sleep: Fair  Appetite:  Fair  Current Medications: Current Facility-Administered Medications  Medication Dose Route Frequency Provider Last Rate Last Admin   acetaminophen (TYLENOL) tablet 650 mg  650 mg Oral Q6H PRN [name], NP       alum & mag hydroxide-simeth (MAALOX/MYLANTA) 200-200-20 MG/5ML suspension 30 mL  30 mL Oral Q4H PRN [name], NP   30 mL at 05/22/22 0816   ARIPiprazole ER (ABILIFY MAINTENA) injection 400 mg  400 mg Intramuscular Q28 days Eligah Anello, [name], MD       cloZAPine (CLOZARIL) tablet 300 mg  300 mg Oral QHS Jayden Rudge T, MD   300 mg at 06/26/22 2122   docusate sodium (COLACE) capsule 200 mg  200 mg Oral BID  Torence Palmeri, [name], MD   200 mg at 06/27/22 0850   famotidine (PEPCID) tablet 20 mg  20 mg Oral BID Isabel Freese, [name], MD   20 mg at 06/27/22 0850   hydrOXYzine (ATARAX) tablet 50 mg  50 mg Oral Q6H PRN Danai Gotto, [name], MD   50 mg at 05/17/22 0944   magnesium citrate solution 1 Bottle  1 Bottle Oral Daily PRN Wynelle Cleveland, RPH   1 Bottle at 06/25/22 1511   magnesium hydroxide (MILK OF MAGNESIA) suspension 30 mL  30 mL Oral Daily PRN Wynelle Cleveland, RPH   30 mL at 06/24/22 2116   nicotine polacrilex (NICORETTE) gum 2 mg  2 mg Oral PRN Dearies Meikle, [name], MD   2 mg at 04/23/22 2200   senna-docusate (Senokot-S) tablet 2 tablet  2 tablet Oral Daily PRN Vuong Musa, [name], MD   2 tablet at 06/22/22 2141   ziprasidone (GEODON) injection 20 mg  20 mg Intramuscular Q12H PRN [name], MD        Lab Results:  Results for  orders placed or performed during the hospital encounter of 04/16/22 (from the past 48 hour(s))  CBC with Differential/Platelet     Status: None   Collection Time: 06/26/22  6:45 AM  Result Value Ref Range   WBC 8.4 4.0 - 10.5 K/uL   RBC 4.55 4.22 - 5.81 MIL/uL   Hemoglobin 13.5 13.0 - 17.0 g/dL   HCT 40.7 39.0 - 52.0 %   MCV 89.5 80.0 - 100.0 fL   MCH 29.7 26.0 - 34.0 pg   MCHC 33.2 30.0 - 36.0 g/dL   RDW 12.2 11.5 - 15.5 %   Platelets 238 150 - 400 K/uL   nRBC 0.0 0.0 - 0.2 %   Neutrophils Relative % 51 %   Neutro Abs 4.3 1.7 - 7.7 K/uL   Lymphocytes Relative 40 %   Lymphs Abs 3.3 0.7 - 4.0 K/uL   Monocytes Relative 6 %   Monocytes Absolute 0.5 0.1 - 1.0 K/uL   Eosinophils Relative 2 %   Eosinophils Absolute 0.2 0.0 - 0.5 K/uL   Basophils Relative 1 %   Basophils Absolute 0.1 0.0 - 0.1 K/uL   Immature Granulocytes 0 %   Abs Immature Granulocytes 0.02 0.00 - 0.07 K/uL    Comment: Performed at [facility], [address] [place], [place] [address]    Blood Alcohol level:  Lab Results  Component Value Date   [facility] <10 0000000    Metabolic Disorder Labs: Lab Results  Component Value Date   HGBA1C 5.5 04/24/2022   MPG 111 04/24/2022   No results found for: "PROLACTIN" Lab Results  Component Value Date   CHOL 220 (H) 04/24/2022   TRIG 700 (H) 04/24/2022   HDL 35 (L) 04/24/2022   CHOLHDL 6.3 04/24/2022   VLDL UNABLE TO CALCULATE IF TRIGLYCERIDE OVER 400 mg/dL 04/24/2022   LDLCALC UNABLE TO CALCULATE IF TRIGLYCERIDE OVER 400 mg/dL 04/24/2022    Physical Findings: AIMS: Facial and Oral Movements Muscles of Facial Expression: None, normal Lips and Perioral Area: None, normal Jaw: None, normal Tongue: None, normal,Extremity Movements Upper (arms, wrists, hands, fingers): None, normal Lower (legs, knees, ankles, toes): None, normal, Trunk Movements Neck, shoulders, hips: None, normal, Overall Severity Severity of abnormal movements (highest score from  questions above): None, normal Incapacitation due to abnormal movements: None, normal Patient's awareness of abnormal movements (rate only patient's report): No Awareness, Dental Status Current problems with teeth  and/or dentures?: No Does patient usually wear dentures?: No  CIWA:    COWS:     Musculoskeletal: Strength & Muscle Tone: within normal limits Gait & Station: normal Patient leans: N/A  Psychiatric Specialty Exam:  Presentation  General Appearance:  Disheveled  Eye Contact: Minimal  Speech: Pressured  Speech Volume: Normal  Handedness: Right   Mood and Affect  Mood: Euphoric; Irritable  Affect: Inappropriate   Thought Process  Thought Processes: Disorganized  Descriptions of Associations:Loose  Orientation:Partial  Thought Content:Illogical; Scattered  History of Schizophrenia/Schizoaffective disorder:No data recorded Duration of Psychotic Symptoms:No data recorded Hallucinations:No data recorded Ideas of Reference:Delusions; Paranoia  Suicidal Thoughts:No data recorded Homicidal Thoughts:No data recorded  Sensorium  Memory: Immediate Poor; Recent Poor; Remote Poor  Judgment: Poor  Insight: Poor   Executive Functions  Concentration: Fair  Attention Span: Fair  Recall: Poor  Fund of Knowledge: Poor  Language: Poor   Psychomotor Activity  Psychomotor Activity:No data recorded  Assets  Assets: Desire for Improvement; Social Support   Sleep  Sleep:No data recorded   Physical Exam: Physical Exam Vitals and nursing note reviewed.  Constitutional:      Appearance: Normal appearance.  HENT:     Head: Normocephalic and atraumatic.     Mouth/Throat:     Pharynx: Oropharynx is clear.  Eyes:     Pupils: Pupils are equal, round, and reactive to light.  Cardiovascular:     Rate and Rhythm: Normal rate and regular rhythm.  Pulmonary:     Effort: Pulmonary effort is normal.     Breath sounds: Normal breath  sounds.  Abdominal:     General: Abdomen is flat.     Palpations: Abdomen is soft.  Musculoskeletal:        General: Normal range of motion.  Skin:    General: Skin is warm and dry.  Neurological:     General: No focal deficit present.     Mental Status: He is alert. Mental status is at baseline.  Psychiatric:        Attention and Perception: Attention normal.        Mood and Affect: Mood normal. Affect is blunt.        Speech: Speech is delayed.        Behavior: Behavior is slowed.        Thought Content: Thought content normal.        Cognition and Memory: Cognition is impaired.    Review of Systems  Constitutional: Negative.   HENT: Negative.    Eyes: Negative.   Respiratory: Negative.    Cardiovascular: Negative.   Gastrointestinal: Negative.   Musculoskeletal: Negative.   Skin: Negative.   Neurological: Negative.   Psychiatric/Behavioral: Negative.     Blood pressure 115/76, pulse 97, temperature 98.1 F (36.7 C), temperature source Oral, resp. rate 18, height '5\' 9"'$  (1.753 m), weight 96.4 kg, SpO2 97 %. Body mass index is 31.38 kg/m.   Treatment Plan Summary: Medication management and Plan I do not see that he has any particular reason for preferring the Abilify over the Invega but on the other hand I do not think there is any reason not to honor his preference given that he had done well on the Abilify in the past.  I warned him that the hospital had been running short of that medicine recently and we may or may not get it to him quickly.  He was fine with that and honestly I am fine with that as well  if we wait a little bit since he has been so very stable on the Clozapine.  I have discontinued the Invega and replace it with Abilify Doree Albee, MD 06/27/2022, 2:04 PM

## 2022-06-27 NOTE — Plan of Care (Signed)
D- Patient alert and oriented. Patient presented in a pleasant mood on assessment reporting that he slept good last night and had no complaints to voice to this Probation officer. Patient denied SI, HI, AVH, and pain at this time. Patient also denied any signs/symptoms of depression and anxiety. Patient had no stated goals for today.  A- Scheduled medications administered to patient, per MD orders. Support and encouragement provided.  Routine safety checks conducted every 15 minutes.  Patient informed to notify staff with problems or concerns.  R- No adverse drug reactions noted. Patient contracts for safety at this time. Patient compliant with medications. Patient receptive, calm, and cooperative. Patient isolates to room, except for meals and medication. Patient has to be prompted to tend to his hygiene. Patient remains safe at this time.  Problem: Education: Goal: Knowledge of General Education information will improve Description: Including pain rating scale, medication(s)/side effects and non-pharmacologic comfort measures Outcome: Progressing   Problem: Health Behavior/Discharge Planning: Goal: Ability to manage health-related needs will improve Outcome: Progressing   Problem: Clinical Measurements: Goal: Ability to maintain clinical measurements within normal limits will improve Outcome: Progressing Goal: Will remain free from infection Outcome: Progressing Goal: Diagnostic test results will improve Outcome: Progressing Goal: Respiratory complications will improve Outcome: Progressing Goal: Cardiovascular complication will be avoided Outcome: Progressing   Problem: Activity: Goal: Risk for activity intolerance will decrease Outcome: Progressing   Problem: Nutrition: Goal: Adequate nutrition will be maintained Outcome: Progressing   Problem: Coping: Goal: Level of anxiety will decrease Outcome: Progressing   Problem: Elimination: Goal: Will not experience complications related to  urinary retention Outcome: Progressing   Problem: Pain Managment: Goal: General experience of comfort will improve Outcome: Progressing   Problem: Safety: Goal: Ability to remain free from injury will improve Outcome: Progressing   Problem: Skin Integrity: Goal: Risk for impaired skin integrity will decrease Outcome: Progressing   Problem: Education: Goal: Knowledge of [place] General Education information/materials will improve Outcome: Progressing Goal: Emotional status will improve Outcome: Progressing Goal: Mental status will improve Outcome: Progressing Goal: Verbalization of understanding the information provided will improve Outcome: Progressing   Problem: Activity: Goal: Interest or engagement in activities will improve Outcome: Progressing Goal: Sleeping patterns will improve Outcome: Progressing   Problem: Coping: Goal: Ability to verbalize frustrations and anger appropriately will improve Outcome: Progressing Goal: Ability to demonstrate self-control will improve Outcome: Progressing   Problem: Health Behavior/Discharge Planning: Goal: Identification of resources available to assist in meeting health care needs will improve Outcome: Progressing Goal: Compliance with treatment plan for underlying cause of condition will improve Outcome: Progressing   Problem: Physical Regulation: Goal: Ability to maintain clinical measurements within normal limits will improve Outcome: Progressing   Problem: Safety: Goal: Periods of time without injury will increase Outcome: Progressing   Problem: Activity: Goal: Will verbalize the importance of balancing activity with adequate rest periods Outcome: Progressing   Problem: Education: Goal: Will be free of psychotic symptoms Outcome: Progressing Goal: Knowledge of the prescribed therapeutic regimen will improve Outcome: Progressing   Problem: Coping: Goal: Coping ability will improve Outcome:  Progressing Goal: Will verbalize feelings Outcome: Progressing   Problem: Health Behavior/Discharge Planning: Goal: Compliance with prescribed medication regimen will improve Outcome: Progressing   Problem: Nutritional: Goal: Ability to achieve adequate nutritional intake will improve Outcome: Progressing   Problem: Role Relationship: Goal: Ability to communicate needs accurately will improve Outcome: Progressing Goal: Ability to interact with others will improve Outcome: Progressing   Problem: Safety: Goal: Ability to redirect  hostility and anger into socially appropriate behaviors will improve Outcome: Progressing Goal: Ability to remain free from injury will improve Outcome: Progressing   Problem: Self-Care: Goal: Ability to participate in self-care as condition permits will improve Outcome: Progressing   Problem: Self-Concept: Goal: Will verbalize positive feelings about self Outcome: Progressing

## 2022-06-27 NOTE — Group Note (Signed)
[facility] LCSW Group Therapy Note    Group Date: 06/27/2022 Start Time: 1300 End Time: 1400  Type of Therapy and Topic:  Group Therapy:  Overcoming Obstacles  Participation Level:  [facility] PARTICIPATION LEVEL: Did Not Attend  Description of Group:   In this group patients will be encouraged to explore what they see as obstacles to their own wellness and recovery. They will be guided to discuss their thoughts, feelings, and behaviors related to these obstacles. The group will process together ways to cope with barriers, with attention given to specific choices patients can make. Each patient will be challenged to identify changes they are motivated to make in order to overcome their obstacles. This group will be process-oriented, with patients participating in exploration of their own experiences as well as giving and receiving support and challenge from other group members.  Therapeutic Goals: 1. Patient will identify personal and current obstacles as they relate to admission. 2. Patient will identify barriers that currently interfere with their wellness or overcoming obstacles.  3. Patient will identify feelings, thought process and behaviors related to these barriers. 4. Patient will identify two changes they are willing to make to overcome these obstacles:    Summary of Patient Progress Patient did not attend group despite encouraged participation.     Therapeutic Modalities:   Cognitive Behavioral Therapy Solution Focused Therapy Motivational Interviewing Relapse Prevention Therapy   Durenda Hurt, [place]

## 2022-06-28 DIAGNOSIS — F203 Undifferentiated schizophrenia: Secondary | ICD-10-CM | POA: Diagnosis not present

## 2022-06-28 NOTE — Plan of Care (Signed)
  Problem: Activity: Goal: Interest or engagement in activities will improve Outcome: Not Progressing   Problem: Safety: Goal: Periods of time without injury will increase Outcome: Progressing

## 2022-06-28 NOTE — Progress Notes (Signed)
[facility] MD Progress Note  06/28/2022 11:19 AM Travis Sandoval  MRN:  [id number] Subjective: Follow-up patient with schizophrenia.  No new complaints.  No changes in behavior.  No new lab studies.  Patient remains stable stays in his room almost all of the time no complaints no concerns expressed. Principal Problem: Schizophrenia ([place]) Diagnosis: Principal Problem:   Schizophrenia ([place]) Active Problems:   Psychosis ([place])  Total Time spent with patient: 20 minutes  Past Psychiatric History: Past history of schizoaffective or schizophrenia  Past Medical History:  Past Medical History:  Diagnosis Date   ADHD    Bipolar 1 disorder ([place])    Hepatitis C    Schizoaffective disorder ([place])    History reviewed. No pertinent surgical history. Family History:  Family History  Family history unknown: Yes   Family Psychiatric  History: See previous Social History:  Social History   Substance and Sexual Activity  Alcohol Use Not Currently     Social History   Substance and Sexual Activity  Drug Use Not Currently    Social History   Socioeconomic History   Marital status: Unknown    Spouse name: Not on file   Number of children: Not on file   Years of education: Not on file   Highest education level: Not on file  Occupational History   Not on file  Tobacco Use   Smoking status: Every Day    Packs/day: 0.25    Years: 15.00    Total pack years: 3.75    Types: Cigarettes   Smokeless tobacco: Not on file  Vaping Use   Vaping Use: Not on file  Substance and Sexual Activity   Alcohol use: Not Currently   Drug use: Not Currently   Sexual activity: Not Currently  Other Topics Concern   Not on file  Social History Narrative   Not on file   Social Determinants of Health   Financial Resource Strain: Not on file  Food Insecurity: Unknown (05/18/2022)   Hunger Vital Sign    Worried About Running Out of Food in the Last Year: Patient refused    [place] in the Last Year:  Patient refused  Transportation Needs: Unknown (05/18/2022)   [place] - Hydrologist (Medical): Patient refused    Lack of Transportation (Non-Medical): Patient refused  Physical Activity: Not on file  Stress: Not on file  Social Connections: Not on file   Additional Social History:                         Sleep: Fair  Appetite:  Fair  Current Medications: Current Facility-Administered Medications  Medication Dose Route Frequency Provider Last Rate Last Admin   acetaminophen (TYLENOL) tablet 650 mg  650 mg Oral Q6H PRN [name], NP       alum & mag hydroxide-simeth (MAALOX/MYLANTA) 200-200-20 MG/5ML suspension 30 mL  30 mL Oral Q4H PRN [name], NP   30 mL at 05/22/22 0816   ARIPiprazole ER (ABILIFY MAINTENA) injection 400 mg  400 mg Intramuscular Q28 days Nevaeha Finerty T, MD   400 mg at 06/27/22 1701   cloZAPine (CLOZARIL) tablet 300 mg  300 mg Oral QHS Marvel Sapp T, MD   300 mg at 06/27/22 2107   docusate sodium (COLACE) capsule 200 mg  200 mg Oral BID Izamar Linden T, MD   200 mg at 06/28/22 0734   famotidine (PEPCID) tablet 20 mg  20 mg Oral BID Anjolie Majer, [name], MD   20 mg at 06/28/22 0734   hydrOXYzine (ATARAX) tablet 50 mg  50 mg Oral Q6H PRN Darryon Bastin, [name], MD   50 mg at 05/17/22 0944   magnesium citrate solution 1 Bottle  1 Bottle Oral Daily PRN Wynelle Cleveland, RPH   1 Bottle at 06/25/22 1511   magnesium hydroxide (MILK OF MAGNESIA) suspension 30 mL  30 mL Oral Daily PRN Wynelle Cleveland, RPH   30 mL at 06/24/22 2116   nicotine polacrilex (NICORETTE) gum 2 mg  2 mg Oral PRN Vickie Melnik, [name], MD   2 mg at 04/23/22 2200   senna-docusate (Senokot-S) tablet 2 tablet  2 tablet Oral Daily PRN Gaye Scorza, [name], MD   2 tablet at 06/22/22 2141   ziprasidone (GEODON) injection 20 mg  20 mg Intramuscular Q12H PRN [name], MD        Lab Results: No results found for this or any previous visit (from the past 65  hour(s)).  Blood Alcohol level:  Lab Results  Component Value Date   ETH <10 0000000    Metabolic Disorder Labs: Lab Results  Component Value Date   HGBA1C 5.5 04/24/2022   MPG 111 04/24/2022   No results found for: "PROLACTIN" Lab Results  Component Value Date   CHOL 220 (H) 04/24/2022   TRIG 700 (H) 04/24/2022   HDL 35 (L) 04/24/2022   CHOLHDL 6.3 04/24/2022   VLDL UNABLE TO CALCULATE IF TRIGLYCERIDE OVER 400 mg/dL 04/24/2022   LDLCALC UNABLE TO CALCULATE IF TRIGLYCERIDE OVER 400 mg/dL 04/24/2022    Physical Findings: AIMS: Facial and Oral Movements Muscles of Facial Expression: None, normal Lips and Perioral Area: None, normal Jaw: None, normal Tongue: None, normal,Extremity Movements Upper (arms, wrists, hands, fingers): None, normal Lower (legs, knees, ankles, toes): None, normal, Trunk Movements Neck, shoulders, hips: None, normal, Overall Severity Severity of abnormal movements (highest score from questions above): None, normal Incapacitation due to abnormal movements: None, normal Patient's awareness of abnormal movements (rate only patient's report): No Awareness, Dental Status Current problems with teeth and/or dentures?: No Does patient usually wear dentures?: No  CIWA:    COWS:     Musculoskeletal: Strength & Muscle Tone: within normal limits Gait & Station: normal Patient leans: N/A  Psychiatric Specialty Exam:  Presentation  General Appearance:  Disheveled  Eye Contact: Minimal  Speech: Pressured  Speech Volume: Normal  Handedness: Right   Mood and Affect  Mood: Euphoric; Irritable  Affect: Inappropriate   Thought Process  Thought Processes: Disorganized  Descriptions of Associations:Loose  Orientation:Partial  Thought Content:Illogical; Scattered  History of Schizophrenia/Schizoaffective disorder:No data recorded Duration of Psychotic Symptoms:No data recorded Hallucinations:No data recorded Ideas of  Reference:Delusions; Paranoia  Suicidal Thoughts:No data recorded Homicidal Thoughts:No data recorded  Sensorium  Memory: Immediate Poor; Recent Poor; Remote Poor  Judgment: Poor  Insight: Poor   Executive Functions  Concentration: Fair  Attention Span: Fair  Recall: Poor  Fund of Knowledge: Poor  Language: Poor   Psychomotor Activity  Psychomotor Activity:No data recorded  Assets  Assets: Desire for Improvement; Social Support   Sleep  Sleep:No data recorded   Physical Exam: Physical Exam Vitals and nursing note reviewed.  Constitutional:      Appearance: Normal appearance.  HENT:     Head: Normocephalic and atraumatic.     Mouth/Throat:     Pharynx: Oropharynx is clear.  Eyes:     Pupils: Pupils are equal, round,  and reactive to light.  Cardiovascular:     Rate and Rhythm: Normal rate and regular rhythm.  Pulmonary:     Effort: Pulmonary effort is normal.     Breath sounds: Normal breath sounds.  Abdominal:     General: Abdomen is flat.     Palpations: Abdomen is soft.  Musculoskeletal:        General: Normal range of motion.  Skin:    General: Skin is warm and dry.  Neurological:     General: No focal deficit present.     Mental Status: He is alert. Mental status is at baseline.  Psychiatric:        Attention and Perception: Attention normal.        Mood and Affect: Affect is blunt.        Speech: Speech is delayed.        Behavior: Behavior is slowed.        Thought Content: Thought content is paranoid.        Cognition and Memory: Cognition is impaired.    Review of Systems  Constitutional: Negative.   HENT: Negative.    Eyes: Negative.   Respiratory: Negative.    Cardiovascular: Negative.   Gastrointestinal: Negative.   Musculoskeletal: Negative.   Skin: Negative.   Neurological: Negative.   Psychiatric/Behavioral: Negative.     Blood pressure 115/75, pulse 99, temperature 97.8 F (36.6 C), temperature source Oral,  resp. rate 18, height '5\' 9"'$  (1.753 m), weight 96.4 kg, SpO2 98 %. Body mass index is 31.38 kg/m.   Treatment Plan Summary: Medication management and Plan no change to medication.  Supportive counseling and encouragement provided to the patient.  Continue to focus pretty much entirely on discharge  Alethia Berthold, MD 06/28/2022, 11:19 AM

## 2022-06-28 NOTE — [facility] Group Notes (Signed)
[facility] Group Notes:  (Nursing/MHT/Case Management/Adjunct)  Date:  06/28/2022  Time:  9:21 PM  Type of Therapy:  Group Therapy  Participation Level:  Minimal  Participation Quality:  Drowsy  Affect:  Flat  Cognitive:  Alert  Insight:  Limited  Engagement in Group:  Lacking and Limited  Modes of Intervention:  Discussion  Summary of Progress/Problems:  Maglione,Ahmar Pickrell E 06/28/2022, 9:21 PM

## 2022-06-28 NOTE — Group Note (Signed)
Recreation Therapy Group Note   Group Topic:Leisure Education  Group Date: 06/28/2022 Start Time: 0945 End Time: 1115 Facilitators: Leona Carry, CTRS Location:  Craft Room  Group Description: Leisure. Patients were given the option to play [place], [place] or [place]. Pts decided to play [place] as a group. After multiple rounds, pts chose to also play [place] as a group. Pt's identified 2 of their leisure interests outside of the hospital and engaged in discussion on the importance of leisure activities in their daily life post-discharge.   Affect/Mood: N/A   Participation Level: Did not attend    Clinical Observations/Individualized Feedback: Travis Sandoval did not attend group due ton resting in his room.   Plan: Continue to engage patient in RT group sessions 2-3x/week.   Vilma Prader, LRT, CTRS 06/28/2022 11:37 AM

## 2022-06-28 NOTE — Group Note (Signed)
LCSW Group Therapy Note    Group Date: 06/28/2022 Start Time: 1400 End Time: 1500   Type of Therapy and Topic: Group Therapy: Body Image  Participation Level:  Did Not Attend  Description of Group:  Patients were educated about body image and asked to think about whether they have a healthy or unhealthy body image. Patients were led in a discussion about factors that contribute to body image, both internal and external. Patients were asked to discuss strengths of the human body outside of appearance, such as being able to fight off diseases and provide stress relief. Lastly, patients were asked to identify one way in which they appreciate their own body outside of appearance.   Therapeutic Goals:   1. Patient will differentiate between a healthy and unhealthy body image. 2. Patient will identify what contributes to body image 3. Patient will discuss the strengths of the human body. 4. Patient will identify a positive attribute of their body outside of physical appearance.  Summary of Patient Progress:  X  Therapeutic Modalities: Cognitive Behavioral Therapy; Solution-Focused Therapy  Rozann Lesches, Latanya Presser 06/28/2022  3:22 PM

## 2022-06-28 NOTE — Group Note (Unsigned)
Date:  06/28/2022 Time:  11:29 PM  Group Topic/Focus:  Coping With Mental Health Crisis:   The purpose of this group is to help patients identify strategies for coping with mental health crisis.  Group discusses possible causes of crisis and ways to manage them effectively.     Participation Level:  {BHH PARTICIPATION HD:996081  Participation Quality:  {BHH PARTICIPATION QUALITY:22265}  Affect:  {BHH AFFECT:22266}  Cognitive:  {BHH COGNITIVE:22267}  Insight: {BHH Insight2:20797}  Engagement in Group:  {BHH ENGAGEMENT IN [place]:3131603  Modes of Intervention:  {BHH MODES OF INTERVENTION:22269}  Additional Comments:  ***  Tayvin Preslar 06/28/2022, 11:29 PM

## 2022-06-28 NOTE — Progress Notes (Signed)
Isolative to self and room. No interaction with peers. Minimal with staff, medication compliant. Denies Si, HI, aVH. Rested throughout shift eyes closed in no distress. Encouragement and support provided. Safety checks maintained. Meds given as prescribed. Pt receptive and remains safe on unit with q 15 min checks.

## 2022-06-28 NOTE — Progress Notes (Signed)
Patient denies SI, HI, and AVH. He is isolates to his room, but will follow directions and comes to the dayroom and medication room for meals and medications. He denies pain and other physical problems. He is compliant with scheduled medications and remains safe on the unit at this time.

## 2022-06-29 DIAGNOSIS — F200 Paranoid schizophrenia: Secondary | ICD-10-CM | POA: Diagnosis not present

## 2022-06-29 NOTE — [facility] Group Notes (Signed)
LCSW Group Therapy Note  06/29/2022 1:00pm  Type of Therapy/Topic:  Group Therapy:  Balance in Life  Participation Level:  Did Not Attend  Description of Group:    This group will address the concept of balance and how it feels and looks when one is unbalanced. Patients will be encouraged to process areas in their lives that are out of balance and identify reasons for remaining unbalanced. Facilitators will guide patients in utilizing problem-solving interventions to address and correct the stressor making their life unbalanced. Understanding and applying boundaries will be explored and addressed for obtaining and maintaining a balanced life. Patients will be encouraged to explore ways to assertively make their unbalanced needs known to significant others in their lives, using other group members and facilitator for support and feedback.  Therapeutic Goals: Patient will identify two or more emotions or situations they have that consume much of in their lives. Patient will identify signs/triggers that life has become out of balance:  Patient will identify two ways to set boundaries in order to achieve balance in their lives:  Patient will demonstrate ability to communicate their needs through discussion and/or role plays  Summary of Patient Progress:      Therapeutic Modalities:   Cognitive Behavioral Therapy Solution-Focused Therapy Assertiveness Training  Deirdre Evener 06/29/2022 2:47 PM

## 2022-06-29 NOTE — Plan of Care (Signed)
D- Patient alert and oriented. Patient presented in a pleasant mood on assessment reporting that he slept "good" last night and had no complaints to voice to this Probation officer. Patient denied SI, HI, AVH, and pain at this time. Patient also denied any signs/symptoms of depression and anxiety, stating that overall, he is feeling "well". Patient had no stated goals for today.  A- Scheduled medications administered to patient, per MD orders. Support and encouragement provided.  Routine safety checks conducted every 15 minutes.  Patient informed to notify staff with problems or concerns.  R- No adverse drug reactions noted. Patient contracts for safety at this time. Patient compliant with medications. Patient receptive, calm, and cooperative. Patient isolates to room, except for meals and medication. Patient remains safe at this time.  Problem: Education: Goal: Knowledge of General Education information will improve Description: Including pain rating scale, medication(s)/side effects and non-pharmacologic comfort measures Outcome: Progressing   Problem: Health Behavior/Discharge Planning: Goal: Ability to manage health-related needs will improve Outcome: Progressing   Problem: Clinical Measurements: Goal: Ability to maintain clinical measurements within normal limits will improve Outcome: Progressing Goal: Will remain free from infection Outcome: Progressing Goal: Diagnostic test results will improve Outcome: Progressing Goal: Respiratory complications will improve Outcome: Progressing Goal: Cardiovascular complication will be avoided Outcome: Progressing   Problem: Activity: Goal: Risk for activity intolerance will decrease Outcome: Progressing   Problem: Nutrition: Goal: Adequate nutrition will be maintained Outcome: Progressing   Problem: Coping: Goal: Level of anxiety will decrease Outcome: Progressing   Problem: Elimination: Goal: Will not experience complications related to  urinary retention Outcome: Progressing   Problem: Pain Managment: Goal: General experience of comfort will improve Outcome: Progressing   Problem: Safety: Goal: Ability to remain free from injury will improve Outcome: Progressing   Problem: Skin Integrity: Goal: Risk for impaired skin integrity will decrease Outcome: Progressing   Problem: Education: Goal: Knowledge of [place] General Education information/materials will improve Outcome: Progressing Goal: Emotional status will improve Outcome: Progressing Goal: Mental status will improve Outcome: Progressing Goal: Verbalization of understanding the information provided will improve Outcome: Progressing   Problem: Activity: Goal: Interest or engagement in activities will improve Outcome: Progressing Goal: Sleeping patterns will improve Outcome: Progressing   Problem: Coping: Goal: Ability to verbalize frustrations and anger appropriately will improve Outcome: Progressing Goal: Ability to demonstrate self-control will improve Outcome: Progressing   Problem: Health Behavior/Discharge Planning: Goal: Identification of resources available to assist in meeting health care needs will improve Outcome: Progressing Goal: Compliance with treatment plan for underlying cause of condition will improve Outcome: Progressing   Problem: Physical Regulation: Goal: Ability to maintain clinical measurements within normal limits will improve Outcome: Progressing   Problem: Safety: Goal: Periods of time without injury will increase Outcome: Progressing   Problem: Activity: Goal: Will verbalize the importance of balancing activity with adequate rest periods Outcome: Progressing   Problem: Education: Goal: Will be free of psychotic symptoms Outcome: Progressing Goal: Knowledge of the prescribed therapeutic regimen will improve Outcome: Progressing   Problem: Coping: Goal: Coping ability will improve Outcome:  Progressing Goal: Will verbalize feelings Outcome: Progressing   Problem: Health Behavior/Discharge Planning: Goal: Compliance with prescribed medication regimen will improve Outcome: Progressing   Problem: Nutritional: Goal: Ability to achieve adequate nutritional intake will improve Outcome: Progressing   Problem: Role Relationship: Goal: Ability to communicate needs accurately will improve Outcome: Progressing Goal: Ability to interact with others will improve Outcome: Progressing   Problem: Safety: Goal: Ability to redirect hostility and anger  into socially appropriate behaviors will improve Outcome: Progressing Goal: Ability to remain free from injury will improve Outcome: Progressing   Problem: Self-Care: Goal: Ability to participate in self-care as condition permits will improve Outcome: Progressing   Problem: Self-Concept: Goal: Will verbalize positive feelings about self Outcome: Progressing

## 2022-06-29 NOTE — Progress Notes (Signed)
[facility] MD Progress Note  06/29/2022 10:49 AM Travis Sandoval  MRN:  [id number] Subjective: Travis Sandoval is seen on rounds.  No changes with him.  Still looking for discharge plans.  He has been compliant with medications.  He denies any side effects.  He has no complaints and nurses report no issues. Principal Problem: Schizophrenia ([place]) Diagnosis: Principal Problem:   Schizophrenia ([place]) Active Problems:   Psychosis ([place])  Total Time spent with patient: 15 minutes  Past Psychiatric History: Schizophrenia  Past Medical History:  Past Medical History:  Diagnosis Date   ADHD    Bipolar 1 disorder ([place])    Hepatitis C    Schizoaffective disorder ([place])    History reviewed. No pertinent surgical history. Family History:  Family History  Family history unknown: Yes   Family Psychiatric  History: Unremarkable Social History:  Social History   Substance and Sexual Activity  Alcohol Use Not Currently     Social History   Substance and Sexual Activity  Drug Use Not Currently    Social History   Socioeconomic History   Marital status: Unknown    Spouse name: Not on file   Number of children: Not on file   Years of education: Not on file   Highest education level: Not on file  Occupational History   Not on file  Tobacco Use   Smoking status: Every Day    Packs/day: 0.25    Years: 15.00    Total pack years: 3.75    Types: Cigarettes   Smokeless tobacco: Not on file  Vaping Use   Vaping Use: Not on file  Substance and Sexual Activity   Alcohol use: Not Currently   Drug use: Not Currently   Sexual activity: Not Currently  Other Topics Concern   Not on file  Social History Narrative   Not on file   Social Determinants of Health   Financial Resource Strain: Not on file  Food Insecurity: Unknown (05/18/2022)   Hunger Vital Sign    Worried About Running Out of Food in the Last Year: Patient refused    [place] in the Last Year: Patient refused  Transportation Needs:  Unknown (05/18/2022)   [place] - Hydrologist (Medical): Patient refused    Lack of Transportation (Non-Medical): Patient refused  Physical Activity: Not on file  Stress: Not on file  Social Connections: Not on file   Additional Social History:                         Sleep: Good  Appetite:  Good  Current Medications: Current Facility-Administered Medications  Medication Dose Route Frequency Provider Last Rate Last Admin   acetaminophen (TYLENOL) tablet 650 mg  650 mg Oral Q6H PRN [name], NP       alum & mag hydroxide-simeth (MAALOX/MYLANTA) 200-200-20 MG/5ML suspension 30 mL  30 mL Oral Q4H PRN [name], NP   30 mL at 06/28/22 2107   ARIPiprazole ER (ABILIFY MAINTENA) injection 400 mg  400 mg Intramuscular Q28 days [name], [name], MD   400 mg at 06/27/22 1701   cloZAPine (CLOZARIL) tablet 300 mg  300 mg Oral QHS [name], [name], MD   300 mg at 06/28/22 2109   docusate sodium (COLACE) capsule 200 mg  200 mg Oral BID [name], [name], MD   200 mg at 06/29/22 0804   famotidine (PEPCID) tablet 20 mg  20 mg  Oral BID [name], [name], MD   20 mg at 06/29/22 0804   hydrOXYzine (ATARAX) tablet 50 mg  50 mg Oral Q6H PRN [name], [name], MD   50 mg at 05/17/22 0944   magnesium citrate solution 1 Bottle  1 Bottle Oral Daily PRN Wynelle Cleveland, RPH   1 Bottle at 06/25/22 1511   magnesium hydroxide (MILK OF MAGNESIA) suspension 30 mL  30 mL Oral Daily PRN Wynelle Cleveland, RPH   30 mL at 06/24/22 2116   nicotine polacrilex (NICORETTE) gum 2 mg  2 mg Oral PRN [name], [name], MD   2 mg at 04/23/22 2200   senna-docusate (Senokot-S) tablet 2 tablet  2 tablet Oral Daily PRN [name], [name], MD   2 tablet at 06/22/22 2141   ziprasidone (GEODON) injection 20 mg  20 mg Intramuscular Q12H PRN [name], MD        Lab Results: No results found for this or any previous visit (from the past 31 hour(s)).  Blood Alcohol level:  Lab Results   Component Value Date   ETH <10 0000000    Metabolic Disorder Labs: Lab Results  Component Value Date   HGBA1C 5.5 04/24/2022   MPG 111 04/24/2022   No results found for: "PROLACTIN" Lab Results  Component Value Date   CHOL 220 (H) 04/24/2022   TRIG 700 (H) 04/24/2022   HDL 35 (L) 04/24/2022   CHOLHDL 6.3 04/24/2022   VLDL UNABLE TO CALCULATE IF TRIGLYCERIDE OVER 400 mg/dL 04/24/2022   LDLCALC UNABLE TO CALCULATE IF TRIGLYCERIDE OVER 400 mg/dL 04/24/2022    Physical Findings: AIMS: Facial and Oral Movements Muscles of Facial Expression: None, normal Lips and Perioral Area: None, normal Jaw: None, normal Tongue: None, normal,Extremity Movements Upper (arms, wrists, hands, fingers): None, normal Lower (legs, knees, ankles, toes): None, normal, Trunk Movements Neck, shoulders, hips: None, normal, Overall Severity Severity of abnormal movements (highest score from questions above): None, normal Incapacitation due to abnormal movements: None, normal Patient's awareness of abnormal movements (rate only patient's report): No Awareness, Dental Status Current problems with teeth and/or dentures?: No Does patient usually wear dentures?: No  CIWA:    COWS:     Musculoskeletal: Strength & Muscle Tone: within normal limits Gait & Station: normal Patient leans: N/A  Psychiatric Specialty Exam:  Presentation  General Appearance:  Disheveled  Eye Contact: Minimal  Speech: Pressured  Speech Volume: Normal  Handedness: Right   Mood and Affect  Mood: Euphoric; Irritable  Affect: Inappropriate   Thought Process  Thought Processes: Disorganized  Descriptions of Associations:Loose  Orientation:Partial  Thought Content:Illogical; Scattered  History of Schizophrenia/Schizoaffective disorder:No data recorded Duration of Psychotic Symptoms:No data recorded Hallucinations:No data recorded Ideas of Reference:Delusions; Paranoia  Suicidal Thoughts:No data  recorded Homicidal Thoughts:No data recorded  Sensorium  Memory: Immediate Poor; Recent Poor; Remote Poor  Judgment: Poor  Insight: Poor   Executive Functions  Concentration: Fair  Attention Span: Fair  Recall: Poor  Fund of Knowledge: Poor  Language: Poor   Psychomotor Activity  Psychomotor Activity:No data recorded  Assets  Assets: Desire for Improvement; Social Support   Sleep  Sleep:No data recorded    Blood pressure 119/76, pulse 95, temperature 98 F (36.7 C), temperature source Oral, resp. rate 17, height '5\' 9"'$  (1.753 m), weight 96.4 kg, SpO2 97 %. Body mass index is 31.38 kg/m.   Treatment Plan Summary: Daily contact with patient to assess and evaluate symptoms and progress in treatment, Medication management, and  Plan continue current medications.  Parks Ranger, DO 06/29/2022, 10:49 AM

## 2022-06-29 NOTE — Progress Notes (Signed)
Patient alert and oriented x 4, affect is flat thoughts are organized and coherent , he denies SI/HI/AVH , 15 minutes safety checks maintained

## 2022-06-30 DIAGNOSIS — F200 Paranoid schizophrenia: Secondary | ICD-10-CM | POA: Diagnosis not present

## 2022-06-30 NOTE — Progress Notes (Signed)
D: Patient alert and oriented x 4, affect is flat but brightens upon approach, patient denies SI/HI/AVH. Patient is pleasant and cooperative, he is grooming improved he took a bath and he   appears less anxious and he is interacting with peers and staff appropriately.  A: Patient was offered support and encouragement, he was given scheduled medications and encouraged to attend groups. 15 minutes safety checks were done for safety.  R: Patient attends groups and interacts well with peers and staff. Patient is complaint with medication regimen. 15 minutes safety checks maintained will continue to monitor.

## 2022-06-30 NOTE — [facility] Group Notes (Signed)
LCSW Wellness Group Note   06/30/2022 12:45pm  Type of Group and Topic: Psychoeducational Group:  Wellness  Participation Level:  did not attend  Description of Group  Wellness group introduces the topic and its focus on developing healthy habits across the spectrum and its relationship to a decrease in hospital admissions.  Six areas of wellness are discussed: physical, social spiritual, intellectual, occupational, and emotional.  Patients are asked to consider their current wellness habits and to identify areas of wellness where they are interested and able to focus on improvements.    Therapeutic Goals Patients will understand components of wellness and how they can positively impact overall health.  Patients will identify areas of wellness where they have developed good habits. Patients will identify areas of wellness where they would like to make improvements.    Summary of Patient Progress     Therapeutic Modalities: Cognitive Behavioral Therapy Psychoeducation    Joanne Chars, LCSW

## 2022-06-30 NOTE — [facility] Group Notes (Signed)
[facility] Group Notes:  (Nursing/MHT/Case Management/Adjunct)  Date:  06/30/2022  Time:  7:15 PM  Type of Therapy:  Psychoeducational Skills  Participation Level:  Did Not Attend   Travis Sandoval [facility] 06/30/2022, 7:15 PM

## 2022-06-30 NOTE — Progress Notes (Signed)
[facility] MD Progress Note  06/30/2022 11:04 AM Travis Sandoval  MRN:  [id number] Subjective: No changes with Marcelino Scot.  He is pleasant and cooperative and compliant with his medications.  He denies any problems.  Nurses report no issues. Principal Problem: Schizophrenia ([place]) Diagnosis: Principal Problem:   Schizophrenia ([place]) Active Problems:   Psychosis ([place])  Total Time spent with patient: 15 minutes  Past Psychiatric History: Schizophrenia  Past Medical History:  Past Medical History:  Diagnosis Date   ADHD    Bipolar 1 disorder ([place])    Hepatitis C    Schizoaffective disorder ([place])    History reviewed. No pertinent surgical history. Family History:  Family History  Family history unknown: Yes   Family Psychiatric  History: Unremarkable Social History:  Social History   Substance and Sexual Activity  Alcohol Use Not Currently     Social History   Substance and Sexual Activity  Drug Use Not Currently    Social History   Socioeconomic History   Marital status: Unknown    Spouse name: Not on file   Number of children: Not on file   Years of education: Not on file   Highest education level: Not on file  Occupational History   Not on file  Tobacco Use   Smoking status: Every Day    Packs/day: 0.25    Years: 15.00    Total pack years: 3.75    Types: Cigarettes   Smokeless tobacco: Not on file  Vaping Use   Vaping Use: Not on file  Substance and Sexual Activity   Alcohol use: Not Currently   Drug use: Not Currently   Sexual activity: Not Currently  Other Topics Concern   Not on file  Social History Narrative   Not on file   Social Determinants of Health   Financial Resource Strain: Not on file  Food Insecurity: Unknown (05/18/2022)   Hunger Vital Sign    Worried About Running Out of Food in the Last Year: Patient refused    [place] in the Last Year: Patient refused  Transportation Needs: Unknown (05/18/2022)   [place] - Radiographer, therapeutic (Medical): Patient refused    Lack of Transportation (Non-Medical): Patient refused  Physical Activity: Not on file  Stress: Not on file  Social Connections: Not on file   Additional Social History:                         Sleep: Good  Appetite:  Good  Current Medications: Current Facility-Administered Medications  Medication Dose Route Frequency Provider Last Rate Last Admin   acetaminophen (TYLENOL) tablet 650 mg  650 mg Oral Q6H PRN [name], NP       alum & mag hydroxide-simeth (MAALOX/MYLANTA) 200-200-20 MG/5ML suspension 30 mL  30 mL Oral Q4H PRN [name], NP   30 mL at 06/28/22 2107   ARIPiprazole ER (ABILIFY MAINTENA) injection 400 mg  400 mg Intramuscular Q28 days [name], [name], MD   400 mg at 06/27/22 1701   cloZAPine (CLOZARIL) tablet 300 mg  300 mg Oral QHS [name], [name], MD   300 mg at 06/29/22 2151   docusate sodium (COLACE) capsule 200 mg  200 mg Oral BID [name], [name], MD   200 mg at 06/30/22 0755   famotidine (PEPCID) tablet 20 mg  20 mg Oral BID [name], [name], MD   20 mg at 06/30/22 [phone]  hydrOXYzine (ATARAX) tablet 50 mg  50 mg Oral Q6H PRN [name], [name], MD   50 mg at 05/17/22 0944   magnesium citrate solution 1 Bottle  1 Bottle Oral Daily PRN Wynelle Cleveland, RPH   1 Bottle at 06/25/22 1511   magnesium hydroxide (MILK OF MAGNESIA) suspension 30 mL  30 mL Oral Daily PRN Wynelle Cleveland, RPH   30 mL at 06/24/22 2116   nicotine polacrilex (NICORETTE) gum 2 mg  2 mg Oral PRN [name], [name], MD   2 mg at 04/23/22 2200   senna-docusate (Senokot-S) tablet 2 tablet  2 tablet Oral Daily PRN [name], [name], MD   2 tablet at 06/22/22 2141   ziprasidone (GEODON) injection 20 mg  20 mg Intramuscular Q12H PRN [name], MD        Lab Results: No results found for this or any previous visit (from the past 48 hour(s)).  Blood Alcohol level:  Lab Results  Component Value Date   ETH <10 0000000    Metabolic  Disorder Labs: Lab Results  Component Value Date   HGBA1C 5.5 04/24/2022   MPG 111 04/24/2022   No results found for: "PROLACTIN" Lab Results  Component Value Date   CHOL 220 (H) 04/24/2022   TRIG 700 (H) 04/24/2022   HDL 35 (L) 04/24/2022   CHOLHDL 6.3 04/24/2022   VLDL UNABLE TO CALCULATE IF TRIGLYCERIDE OVER 400 mg/dL 04/24/2022   LDLCALC UNABLE TO CALCULATE IF TRIGLYCERIDE OVER 400 mg/dL 04/24/2022    Physical Findings: AIMS: Facial and Oral Movements Muscles of Facial Expression: None, normal Lips and Perioral Area: None, normal Jaw: None, normal Tongue: None, normal,Extremity Movements Upper (arms, wrists, hands, fingers): None, normal Lower (legs, knees, ankles, toes): None, normal, Trunk Movements Neck, shoulders, hips: None, normal, Overall Severity Severity of abnormal movements (highest score from questions above): None, normal Incapacitation due to abnormal movements: None, normal Patient's awareness of abnormal movements (rate only patient's report): No Awareness, Dental Status Current problems with teeth and/or dentures?: No Does patient usually wear dentures?: No  CIWA:    COWS:     Musculoskeletal: Strength & Muscle Tone: within normal limits Gait & Station: normal Patient leans: N/A  Psychiatric Specialty Exam:  Presentation  General Appearance:  Disheveled  Eye Contact: Minimal  Speech: Pressured  Speech Volume: Normal  Handedness: Right   Mood and Affect  Mood: Euphoric; Irritable  Affect: Inappropriate   Thought Process  Thought Processes: Disorganized  Descriptions of Associations:Loose  Orientation:Partial  Thought Content:Illogical; Scattered  History of Schizophrenia/Schizoaffective disorder:No data recorded Duration of Psychotic Symptoms:No data recorded Hallucinations:No data recorded Ideas of Reference:Delusions; Paranoia  Suicidal Thoughts:No data recorded Homicidal Thoughts:No data recorded  Sensorium   Memory: Immediate Poor; Recent Poor; Remote Poor  Judgment: Poor  Insight: Poor   Executive Functions  Concentration: Fair  Attention Span: Fair  Recall: Poor  Fund of Knowledge: Poor  Language: Poor   Psychomotor Activity  Psychomotor Activity:No data recorded  Assets  Assets: Desire for Improvement; Social Support   Sleep  Sleep:No data recorded    Blood pressure 122/70, pulse (!) 103, temperature 98.2 F (36.8 C), temperature source Oral, resp. rate 17, height '5\' 9"'$  (1.753 m), weight 96.4 kg, SpO2 97 %. Body mass index is 31.38 kg/m.   Treatment Plan Summary: Daily contact with patient to assess and evaluate symptoms and progress in treatment, Medication management, and Plan continue current medications.  Parks Ranger, DO 06/30/2022, 11:04 AM

## 2022-06-30 NOTE — Plan of Care (Signed)
Patient pleasant and cooperative on approach. Isolates to his room. Patient denies SI,HI and AVH. No issues verbalized. ADLs maintained. Support and encouragement given.

## 2022-07-01 DIAGNOSIS — F203 Undifferentiated schizophrenia: Secondary | ICD-10-CM | POA: Diagnosis not present

## 2022-07-01 NOTE — Group Note (Signed)
Recreation Therapy Group Note   Group Topic:Goal Setting  Group Date: 07/01/2022 Start Time: 1000 End Time: 1055 Facilitators: Vilma Prader, LRT, CTRS Location:  Craft Room  Group Description: Scientist, physiological. Patients were given many different magazines, a glue stick, markers, and a piece of cardstock paper. LRT and pts discussed the importance of having goals in life. LRT and pts discussed the difference between short-term and long-term goals. LRT encouraged pts to create a vision board, with images they picked and then cut out by LRT from the magazine, for themselves that capture their short and long-term goals they have for themselves. On the back of the paper, pt encouraged to write 3 different coping skills that can help them reach those goals. LRT encouraged pts to show and explain their vision board to the group once complete. LRT offered to laminate vision board once dry and complete.   Affect/Mood: N/A   Participation Level: Did not attend    Clinical Observations/Individualized Feedback: Jorman did not attend group due to resting in his room.   Plan: Continue to engage patient in RT group sessions 2-3x/week.   Vilma Prader, LRT, CTRS 07/01/2022 11:21 AM

## 2022-07-01 NOTE — Progress Notes (Signed)
D: Patient alert and oriented x 3, affect is blunted and thoughts are organized, he denies SI/HI/AVH. Patient is pleasant and cooperative.  he appears less anxious and he is interacting with peers and staff appropriately.  A: Patient was offered support and encouragement. He was given scheduled medications and encouraged to attend groups.  15 minute checks were done for safety.  R: Patient attends groups and interacts with peers and staff, he is complaint with medication this evening. Patient has no complaints and he is receptive to treatment and safety maintained on unit.

## 2022-07-01 NOTE — [facility] Group Notes (Signed)
[facility] Group Notes:  (Nursing/MHT/Case Management/Adjunct)  Date:  07/01/2022  Time:  9:48 PM  Type of Therapy:   Wrap up  Participation Level:  Active  Participation Quality:  Appropriate  Affect:  Appropriate  Cognitive:  Alert  Insight:  Good  Engagement in Group:  Engaged and no goal.  Modes of Intervention:   Flat  Summary of Progress/Problems:  Travis Sandoval 07/01/2022, 9:48 PM

## 2022-07-01 NOTE — Progress Notes (Signed)
This writer assumed care of pt at 1pm. Patient remains isolative to his room, but does come outside to the courtyard.

## 2022-07-01 NOTE — [facility] Group Notes (Signed)
[facility] Group Notes:  (Nursing/MHT/Case Management/Adjunct)  Date:  07/01/2022  Time:  3:25 PM  Type of Therapy:  Psychoeducational Skills  Participation Level:  Minimal  Participation Quality:  Appropriate  Affect:  Appropriate  Cognitive:  Appropriate  Insight:  Appropriate  Engagement in Group:  Limited  Modes of Intervention:  Activity  Summary of Progress/Problems:  Adela Lank [facility] 07/01/2022, 3:25 PM

## 2022-07-01 NOTE — Progress Notes (Signed)
[facility] MD Progress Note  07/01/2022 2:46 PM Travis Sandoval  MRN:  [id number] Subjective: Follow-up 42 year old man with schizophrenia.  Patient still stays in his room most of the time.  When I come to see him he sits up and is polite but with little to say.  Denies any complaints. Principal Problem: Schizophrenia ([place]) Diagnosis: Principal Problem:   Schizophrenia ([place]) Active Problems:   Psychosis ([place])  Total Time spent with patient: 30 minutes  Past Psychiatric History: See previous  Past Medical History:  Past Medical History:  Diagnosis Date   ADHD    Bipolar 1 disorder ([employer])    Hepatitis C    Schizoaffective disorder ([place])    History reviewed. No pertinent surgical history. Family History:  Family History  Family history unknown: Yes   Family Psychiatric  History: See previous Social History:  Social History   Substance and Sexual Activity  Alcohol Use Not Currently     Social History   Substance and Sexual Activity  Drug Use Not Currently    Social History   Socioeconomic History   Marital status: Unknown    Spouse name: Not on file   Number of children: Not on file   Years of education: Not on file   Highest education level: Not on file  Occupational History   Not on file  Tobacco Use   Smoking status: Every Day    Packs/day: 0.25    Years: 15.00    Total pack years: 3.75    Types: Cigarettes   Smokeless tobacco: Not on file  Vaping Use   Vaping Use: Not on file  Substance and Sexual Activity   Alcohol use: Not Currently   Drug use: Not Currently   Sexual activity: Not Currently  Other Topics Concern   Not on file  Social History Narrative   Not on file   Social Determinants of Health   Financial Resource Strain: Not on file  Food Insecurity: Unknown (05/18/2022)   Hunger Vital Sign    Worried About Running Out of Food in the Last Year: Patient refused    [place] in the Last Year: Patient refused  Transportation Needs: Unknown  (05/18/2022)   [place] - Hydrologist (Medical): Patient refused    Lack of Transportation (Non-Medical): Patient refused  Physical Activity: Not on file  Stress: Not on file  Social Connections: Not on file   Additional Social History:                         Sleep: Fair  Appetite:  Fair  Current Medications: Current Facility-Administered Medications  Medication Dose Route Frequency Provider Last Rate Last Admin   acetaminophen (TYLENOL) tablet 650 mg  650 mg Oral Q6H PRN [name], NP       alum & mag hydroxide-simeth (MAALOX/MYLANTA) 200-200-20 MG/5ML suspension 30 mL  30 mL Oral Q4H PRN [name], NP   30 mL at 06/28/22 2107   ARIPiprazole ER (ABILIFY MAINTENA) injection 400 mg  400 mg Intramuscular Q28 days Alyssamarie Mounsey T, MD   400 mg at 06/27/22 1701   cloZAPine (CLOZARIL) tablet 300 mg  300 mg Oral QHS Kadeem Hyle T, MD   300 mg at 06/30/22 2121   docusate sodium (COLACE) capsule 200 mg  200 mg Oral BID Carl[place] Enke T, MD   200 mg at 07/01/22 0806   famotidine (PEPCID) tablet 20 mg  20  mg Oral BID Anai Lipson, [name], MD   20 mg at 07/01/22 [id number]   hydrOXYzine (ATARAX) tablet 50 mg  50 mg Oral Q6H PRN Hadiyah Maricle, [name], MD   50 mg at 05/17/22 0944   magnesium citrate solution 1 Bottle  1 Bottle Oral Daily PRN Wynelle Cleveland, RPH   1 Bottle at 06/25/22 1511   magnesium hydroxide (MILK OF MAGNESIA) suspension 30 mL  30 mL Oral Daily PRN Wynelle Cleveland, RPH   30 mL at 06/24/22 2116   nicotine polacrilex (NICORETTE) gum 2 mg  2 mg Oral PRN Anjelita Sheahan, [name], MD   2 mg at 04/23/22 2200   senna-docusate (Senokot-S) tablet 2 tablet  2 tablet Oral Daily PRN Kashana Breach, [name], MD   2 tablet at 06/22/22 2141   ziprasidone (GEODON) injection 20 mg  20 mg Intramuscular Q12H PRN [name], MD        Lab Results: No results found for this or any previous visit (from the past 54 hour(s)).  Blood Alcohol level:  Lab Results   Component Value Date   ETH <10 0000000    Metabolic Disorder Labs: Lab Results  Component Value Date   HGBA1C 5.5 04/24/2022   MPG 111 04/24/2022   No results found for: "PROLACTIN" Lab Results  Component Value Date   CHOL 220 (H) 04/24/2022   TRIG 700 (H) 04/24/2022   HDL 35 (L) 04/24/2022   CHOLHDL 6.3 04/24/2022   VLDL UNABLE TO CALCULATE IF TRIGLYCERIDE OVER 400 mg/dL 04/24/2022   LDLCALC UNABLE TO CALCULATE IF TRIGLYCERIDE OVER 400 mg/dL 04/24/2022    Physical Findings: AIMS: Facial and Oral Movements Muscles of Facial Expression: None, normal Lips and Perioral Area: None, normal Jaw: None, normal Tongue: None, normal,Extremity Movements Upper (arms, wrists, hands, fingers): None, normal Lower (legs, knees, ankles, toes): None, normal, Trunk Movements Neck, shoulders, hips: None, normal, Overall Severity Severity of abnormal movements (highest score from questions above): None, normal Incapacitation due to abnormal movements: None, normal Patient's awareness of abnormal movements (rate only patient's report): No Awareness, Dental Status Current problems with teeth and/or dentures?: No Does patient usually wear dentures?: No  CIWA:    COWS:     Musculoskeletal: Strength & Muscle Tone: within normal limits Gait & Station: normal Patient leans: N/A  Psychiatric Specialty Exam:  Presentation  General Appearance:  Disheveled  Eye Contact: Minimal  Speech: Pressured  Speech Volume: Normal  Handedness: Right   Mood and Affect  Mood: Euphoric; Irritable  Affect: Inappropriate   Thought Process  Thought Processes: Disorganized  Descriptions of Associations:Loose  Orientation:Partial  Thought Content:Illogical; Scattered  History of Schizophrenia/Schizoaffective disorder:No data recorded Duration of Psychotic Symptoms:No data recorded Hallucinations:No data recorded Ideas of Reference:Delusions; Paranoia  Suicidal Thoughts:No data  recorded Homicidal Thoughts:No data recorded  Sensorium  Memory: Immediate Poor; Recent Poor; Remote Poor  Judgment: Poor  Insight: Poor   Executive Functions  Concentration: Fair  Attention Span: Fair  Recall: Poor  Fund of Knowledge: Poor  Language: Poor   Psychomotor Activity  Psychomotor Activity:No data recorded  Assets  Assets: Desire for Improvement; Social Support   Sleep  Sleep:No data recorded   Physical Exam: Physical Exam Vitals and nursing note reviewed.  Constitutional:      Appearance: Normal appearance.  HENT:     Head: Normocephalic and atraumatic.     Mouth/Throat:     Pharynx: Oropharynx is clear.  Eyes:     Pupils: Pupils are equal, round, and  reactive to light.  Cardiovascular:     Rate and Rhythm: Normal rate and regular rhythm.  Pulmonary:     Effort: Pulmonary effort is normal.     Breath sounds: Normal breath sounds.  Abdominal:     General: Abdomen is flat.     Palpations: Abdomen is soft.  Musculoskeletal:        General: Normal range of motion.  Skin:    General: Skin is warm and dry.  Neurological:     General: No focal deficit present.     Mental Status: He is alert. Mental status is at baseline.  Psychiatric:        Attention and Perception: He is inattentive.        Mood and Affect: Mood normal. Affect is blunt.        Speech: Speech is delayed.        Behavior: Behavior is slowed.        Thought Content: Thought content is paranoid.    Review of Systems  Constitutional: Negative.   HENT: Negative.    Eyes: Negative.   Respiratory: Negative.    Cardiovascular: Negative.   Gastrointestinal: Negative.   Musculoskeletal: Negative.   Skin: Negative.   Neurological: Negative.   Psychiatric/Behavioral: Negative.     Blood pressure 110/72, pulse (!) 101, temperature 97.8 F (36.6 C), temperature source Oral, resp. rate 18, height '5\' 9"'$  (1.753 m), weight 96.4 kg, SpO2 98 %. Body mass index is 31.38  kg/m.   Treatment Plan Summary: Medication management and Plan no change to medication.  Seems to be tolerating clozapine fine.  No new complaints.  We continue to focus entirely on discharge planning.  Alethia Berthold, MD 07/01/2022, 2:46 PM

## 2022-07-01 NOTE — BH IP Treatment Plan (Signed)
Interdisciplinary Treatment and Diagnostic Plan Update  07/01/2022 Time of Session: 8:30AM Pace Poitier MRN: [id number]  Principal Diagnosis: Schizophrenia [facility])  Secondary Diagnoses: Principal Problem:   Schizophrenia ([place]) Active Problems:   Psychosis ([place])   Current Medications:  Current Facility-Administered Medications  Medication Dose Route Frequency Provider Last Rate Last Admin   acetaminophen (TYLENOL) tablet 650 mg  650 mg Oral Q6H PRN [name], NP       alum & mag hydroxide-simeth (MAALOX/MYLANTA) 200-200-20 MG/5ML suspension 30 mL  30 mL Oral Q4H PRN [name], NP   30 mL at 06/28/22 2107   ARIPiprazole ER (ABILIFY MAINTENA) injection 400 mg  400 mg Intramuscular Q28 days [name], [name], MD   400 mg at 06/27/22 1701   cloZAPine (CLOZARIL) tablet 300 mg  300 mg Oral QHS [name], [name], MD   300 mg at 06/30/22 2121   docusate sodium (COLACE) capsule 200 mg  200 mg Oral BID [name], [name], MD   200 mg at 07/01/22 0806   famotidine (PEPCID) tablet 20 mg  20 mg Oral BID [name], [name], MD   20 mg at 07/01/22 0806   hydrOXYzine (ATARAX) tablet 50 mg  50 mg Oral Q6H PRN [name], [name], MD   50 mg at 05/17/22 0944   magnesium citrate solution 1 Bottle  1 Bottle Oral Daily PRN Wynelle Cleveland, RPH   1 Bottle at 06/25/22 1511   magnesium hydroxide (MILK OF MAGNESIA) suspension 30 mL  30 mL Oral Daily PRN Wynelle Cleveland, RPH   30 mL at 06/24/22 2116   nicotine polacrilex (NICORETTE) gum 2 mg  2 mg Oral PRN [name], [name], MD   2 mg at 04/23/22 2200   senna-docusate (Senokot-S) tablet 2 tablet  2 tablet Oral Daily PRN [name], [name], MD   2 tablet at 06/22/22 2141   ziprasidone (GEODON) injection 20 mg  20 mg Intramuscular Q12H PRN [name], MD       PTA Medications: Medications Prior to Admission  Medication Sig Dispense Refill Last Dose   ABILIFY MAINTENA 400 MG SRER injection Inject 400 mg into the muscle every 28 (twenty-eight) days.       ARIPiprazole (ABILIFY) 10 MG tablet Take 10 mg by mouth daily. (Patient not taking: Reported on 04/16/2022)      atomoxetine (STRATTERA) 40 MG capsule Take 40 mg by mouth daily. (Patient not taking: Reported on 04/16/2022)      atorvastatin (LIPITOR) 40 MG tablet Take 40 mg by mouth daily.      budesonide-formoterol (SYMBICORT) 160-4.5 MCG/ACT inhaler Inhale 2 puffs into the lungs.      cetirizine (ZYRTEC) 10 MG tablet Take 10 mg by mouth daily.      clozapine (CLOZARIL) 200 MG tablet Take 200 mg by mouth 2 (two) times daily. Take along with one 25 mg tablet for total 225 mg twice daily      cloZAPine (CLOZARIL) 25 MG tablet Take 25 mg by mouth 2 (two) times daily. Take along with one 200 mg tablet for total 225 mg twice daily      Dexlansoprazole (DEXILANT) 30 MG capsule Take 30 mg by mouth daily. (Patient not taking: Reported on 04/16/2022)      hydrOXYzine (VISTARIL) 50 MG capsule Take 50 mg by mouth 3 (three) times daily as needed for itching. (Patient not taking: Reported on 04/16/2022)      Melatonin 5 MG TABS Take 5 mg by mouth at bedtime  as needed (sleep).      metoprolol succinate (TOPROL-XL) 25 MG 24 hr tablet Take 12.5 mg by mouth daily.      Phosphatidyl Choline 65 MG TABS Take 1 tablet by mouth daily.  (Patient not taking: Reported on 04/16/2022)      valproic acid (DEPAKENE) 250 MG/5ML solution Take 750-1,000 mLs by mouth 2 (two) times daily. 1000 mg (20 mL) every morning and 750 mg (15 mL) daily at bedtime       Patient Stressors: Other: Psychosis    Patient Strengths: Ability for insight  Active sense of humor  Average or above average intelligence  Capable of independent living  Supportive family/friends   Treatment Modalities: Medication Management, Group therapy, Case management,  1 to 1 session with clinician, Psychoeducation, Recreational therapy.   Physician Treatment Plan for Primary Diagnosis: Schizophrenia ([place]) Long Term Goal(s):     Short Term Goals: None, pt  is a poor hx.  Medication Management: Evaluate patient's response, side effects, and tolerance of medication regimen.  Therapeutic Interventions: 1 to 1 sessions, Unit Group sessions and Medication administration.  Evaluation of Outcomes: Adequate for Discharge  Physician Treatment Plan for Secondary Diagnosis: Principal Problem:   Schizophrenia ([place]) Active Problems:   Psychosis ([place])  Long Term Goal(s):     Short Term Goals: None, pt is a poor hx.     Medication Management: Evaluate patient's response, side effects, and tolerance of medication regimen.  Therapeutic Interventions: 1 to 1 sessions, Unit Group sessions and Medication administration.  Evaluation of Outcomes: Adequate for Discharge   RN Treatment Plan for Primary Diagnosis: Schizophrenia ([place]) Long Term Goal(s): Knowledge of disease and therapeutic regimen to maintain health will improve  Short Term Goals: Ability to demonstrate self-control, Ability to participate in decision making will improve, Ability to verbalize feelings will improve, Ability to disclose and discuss suicidal ideas, Ability to identify and develop effective coping behaviors will improve, and Compliance with prescribed medications will improve  Medication Management: RN will administer medications as ordered by provider, will assess and evaluate patient's response and provide education to patient for prescribed medication. RN will report any adverse and/or side effects to prescribing provider.  Therapeutic Interventions: 1 on 1 counseling sessions, Psychoeducation, Medication administration, Evaluate responses to treatment, Monitor vital signs and CBGs as ordered, Perform/monitor CIWA, COWS, AIMS and Fall Risk screenings as ordered, Perform wound care treatments as ordered.  Evaluation of Outcomes: Adequate for Discharge   LCSW Treatment Plan for Primary Diagnosis: Schizophrenia [facility]) Long Term Goal(s): Safe transition to appropriate next level  of care at discharge, Engage patient in therapeutic group addressing interpersonal concerns.  Short Term Goals: Engage patient in aftercare planning with referrals and resources, Increase social support, Increase ability to appropriately verbalize feelings, Increase emotional regulation, Facilitate acceptance of mental health diagnosis and concerns, and Increase skills for wellness and recovery  Therapeutic Interventions: Assess for all discharge needs, 1 to 1 time with Social worker, Explore available resources and support systems, Assess for adequacy in community support network, Educate family and significant other(s) on suicide prevention, Complete Psychosocial Assessment, Interpersonal group therapy.  Evaluation of Outcomes: Adequate for Discharge   Progress in Treatment: Attending groups: No. Participating in groups: No. Taking medication as prescribed: Yes. Toleration medication: Yes. Family/Significant other contact made: Yes, individual(s) contacted:  SPE completed with the patient's father.  Patient understands diagnosis: Yes. Discussing patient identified problems/goals with staff: Yes. Medical problems stabilized or resolved: Yes. Denies suicidal/homicidal ideation: Yes. Issues/concerns per patient self-inventory:  No. Other: none  New problem(s) identified: No, Describe:  none Update 06/30/2021:  No changes at this time.    New Short Term/Long Term Goal(s): elimination of symptoms of psychosis, medication management for mood stabilization; elimination of SI thoughts; development of comprehensive mental wellness plan. Update 06/30/2021:  No changes at this time.    Patient Goals:  No additional treatment goals identified by patient. Update 06/30/2021:  No changes at this time.    Discharge Plan or Barriers: Patient remains on the unit as placement continues to be sought. Funding remains the concern at this time and biggest barrier to placement at this time. Update 06/30/2021:  No  changes at this time.    Reason for Continuation of Hospitalization: Anxiety Delusions  Depression Hallucinations  Estimated Length of Stay: TBD  Last [place] Suicide Severity Risk Score: Flowsheet Row Admission (Current) from 04/16/2022 in [facility] from 04/15/2022 in [facility] Emergency Department at [place] No Risk No Risk       Last PHQ 2/9 Scores:     No data to display          Scribe for Treatment Team: Rozann Lesches, LCSW 07/01/2022 9:20 AM

## 2022-07-01 NOTE — Group Note (Signed)
[facility] LCSW Group Therapy Note    Group Date: 07/01/2022 Start Time: 1300 End Time: 1400  Type of Therapy and Topic:  Group Therapy:  Overcoming Obstacles  Participation Level:  [facility] PARTICIPATION LEVEL: Did Not Attend   Description of Group:   In this group patients will be encouraged to explore what they see as obstacles to their own wellness and recovery. They will be guided to discuss their thoughts, feelings, and behaviors related to these obstacles. The group will process together ways to cope with barriers, with attention given to specific choices patients can make. Each patient will be challenged to identify changes they are motivated to make in order to overcome their obstacles. This group will be process-oriented, with patients participating in exploration of their own experiences as well as giving and receiving support and challenge from other group members.  Therapeutic Goals: 1. Patient will identify personal and current obstacles as they relate to admission. 2. Patient will identify barriers that currently interfere with their wellness or overcoming obstacles.  3. Patient will identify feelings, thought process and behaviors related to these barriers. 4. Patient will identify two changes they are willing to make to overcome these obstacles:    Summary of Patient Progress X   Therapeutic Modalities:   Cognitive Behavioral Therapy Solution Focused Therapy Motivational Interviewing Relapse Prevention Therapy   Shirl Harris, LCSW

## 2022-07-02 DIAGNOSIS — F203 Undifferentiated schizophrenia: Secondary | ICD-10-CM | POA: Diagnosis not present

## 2022-07-02 MED ORDER — CLOZAPINE 25 MG PO TABS
350.0000 mg | ORAL_TABLET | Freq: Every day | ORAL | Status: DC
  Administered 2022-07-02 – 2022-08-03 (×32): 350 mg via ORAL
  Filled 2022-07-02 (×3): qty 2
  Filled 2022-07-02: qty 3
  Filled 2022-07-02 (×30): qty 2

## 2022-07-02 NOTE — Plan of Care (Signed)
  Problem: Education: Goal: Knowledge of General Education information will improve Description: Including pain rating scale, medication(s)/side effects and non-pharmacologic comfort measures Outcome: Progressing   Problem: Health Behavior/Discharge Planning: Goal: Ability to manage health-related needs will improve Outcome: Progressing   Problem: Clinical Measurements: Goal: Ability to maintain clinical measurements within normal limits will improve Outcome: Progressing Goal: Will remain free from infection Outcome: Progressing Goal: Diagnostic test results will improve Outcome: Progressing Goal: Respiratory complications will improve Outcome: Progressing Goal: Cardiovascular complication will be avoided Outcome: Progressing   Problem: Activity: Goal: Risk for activity intolerance will decrease Outcome: Progressing   Problem: Nutrition: Goal: Adequate nutrition will be maintained Outcome: Progressing   Problem: Coping: Goal: Level of anxiety will decrease Outcome: Progressing   Problem: Elimination: Goal: Will not experience complications related to urinary retention Outcome: Progressing   Problem: Pain Managment: Goal: General experience of comfort will improve Outcome: Progressing   Problem: Safety: Goal: Ability to remain free from injury will improve Outcome: Progressing   Problem: Skin Integrity: Goal: Risk for impaired skin integrity will decrease Outcome: Progressing   Problem: Education: Goal: Knowledge of [place] General Education information/materials will improve Outcome: Progressing Goal: Emotional status will improve Outcome: Progressing Goal: Mental status will improve Outcome: Progressing Goal: Verbalization of understanding the information provided will improve Outcome: Progressing   Problem: Activity: Goal: Interest or engagement in activities will improve Outcome: Progressing Goal: Sleeping patterns will improve Outcome:  Progressing   Problem: Coping: Goal: Ability to verbalize frustrations and anger appropriately will improve Outcome: Progressing Goal: Ability to demonstrate self-control will improve Outcome: Progressing   Problem: Health Behavior/Discharge Planning: Goal: Identification of resources available to assist in meeting health care needs will improve Outcome: Progressing Goal: Compliance with treatment plan for underlying cause of condition will improve Outcome: Progressing   Problem: Physical Regulation: Goal: Ability to maintain clinical measurements within normal limits will improve Outcome: Progressing   Problem: Safety: Goal: Periods of time without injury will increase Outcome: Progressing   Problem: Activity: Goal: Will verbalize the importance of balancing activity with adequate rest periods Outcome: Progressing   Problem: Education: Goal: Will be free of psychotic symptoms Outcome: Progressing Goal: Knowledge of the prescribed therapeutic regimen will improve Outcome: Progressing   Problem: Coping: Goal: Coping ability will improve Outcome: Progressing Goal: Will verbalize feelings Outcome: Progressing   Problem: Health Behavior/Discharge Planning: Goal: Compliance with prescribed medication regimen will improve Outcome: Progressing   Problem: Nutritional: Goal: Ability to achieve adequate nutritional intake will improve Outcome: Progressing   Problem: Role Relationship: Goal: Ability to communicate needs accurately will improve Outcome: Progressing Goal: Ability to interact with others will improve Outcome: Progressing   Problem: Safety: Goal: Ability to redirect hostility and anger into socially appropriate behaviors will improve Outcome: Progressing Goal: Ability to remain free from injury will improve Outcome: Progressing   Problem: Self-Care: Goal: Ability to participate in self-care as condition permits will improve Outcome: Progressing    Problem: Self-Concept: Goal: Will verbalize positive feelings about self Outcome: Progressing

## 2022-07-02 NOTE — Group Note (Signed)
Recreation Therapy Group Note   Group Topic:Healthy Support Systems  Group Date: 07/02/2022 Start Time: 1000 End Time: 1055 Facilitators: Vilma Prader, LRT, CTRS Location:  Craft Room  Group Description: Straw Bridge. Patients were given 10 plastic drinking straws and an equal length of masking tape. Using the materials provided, patients were instructed to build a free-standing bridge-like structure to suspend an everyday item (ex: deck of cards) off the floor or table surface. All materials were required to be used in Conservation officer, nature. LRT facilitated post-activity discussion reviewing the importance of having strong and healthy support systems in our lives. LRT discussed how the people in our lives serve as the tape and the book we placed on top of our straw structure are the stressors we face in daily life. LRT and pts discussed what happens in our life when things get too heavy for [place], and we don't have strong supports outside of the hospital. Pt shared 2 of their healthy supports aloud in the group.   Affect/Mood: N/A   Participation Level: Did not attend    Clinical Observations/Individualized Feedback: Travis Sandoval did not attend group due to resting in his room.  Plan: Continue to engage patient in RT group sessions 2-3x/week.   Vilma Prader, LRT, CTRS 07/02/2022 11:14 AM

## 2022-07-02 NOTE — Progress Notes (Signed)
[facility] MD Progress Note  07/02/2022 4:44 PM Travis Sandoval  MRN:  [id number] Subjective: Follow-up for this 42 year old man with schizophrenia.  No change in presentation.  Stays to himself in his room.  When approached can have a reasonable back and forth conversation no behavior issues. Principal Problem: Schizophrenia ([place]) Diagnosis: Principal Problem:   Schizophrenia ([place]) Active Problems:   Psychosis ([place])  Total Time spent with patient: 30 minutes  Past Psychiatric History: Past history of schizophrenia  Past Medical History:  Past Medical History:  Diagnosis Date   ADHD    Bipolar 1 disorder ([place])    Hepatitis C    Schizoaffective disorder ([place])    History reviewed. No pertinent surgical history. Family History:  Family History  Family history unknown: Yes   Family Psychiatric  History: See previous Social History:  Social History   Substance and Sexual Activity  Alcohol Use Not Currently     Social History   Substance and Sexual Activity  Drug Use Not Currently    Social History   Socioeconomic History   Marital status: Unknown    Spouse name: Not on file   Number of children: Not on file   Years of education: Not on file   Highest education level: Not on file  Occupational History   Not on file  Tobacco Use   Smoking status: Every Day    Packs/day: 0.25    Years: 15.00    Total pack years: 3.75    Types: Cigarettes   Smokeless tobacco: Not on file  Vaping Use   Vaping Use: Not on file  Substance and Sexual Activity   Alcohol use: Not Currently   Drug use: Not Currently   Sexual activity: Not Currently  Other Topics Concern   Not on file  Social History Narrative   Not on file   Social Determinants of Health   Financial Resource Strain: Not on file  Food Insecurity: Unknown (05/18/2022)   Hunger Vital Sign    Worried About Running Out of Food in the Last Year: Patient refused    [place] in the Last Year: Patient refused   Transportation Needs: Unknown (05/18/2022)   [employer] - Hydrologist (Medical): Patient refused    Lack of Transportation (Non-Medical): Patient refused  Physical Activity: Not on file  Stress: Not on file  Social Connections: Not on file   Additional Social History:                         Sleep: Fair  Appetite:  Fair  Current Medications: Current Facility-Administered Medications  Medication Dose Route Frequency Provider Last Rate Last Admin   acetaminophen (TYLENOL) tablet 650 mg  650 mg Oral Q6H PRN [name], NP       alum & mag hydroxide-simeth (MAALOX/MYLANTA) 200-200-20 MG/5ML suspension 30 mL  30 mL Oral Q4H PRN [name], NP   30 mL at 06/28/22 2107   ARIPiprazole ER (ABILIFY MAINTENA) injection 400 mg  400 mg Intramuscular Q28 days Parker Wherley T, MD   400 mg at 06/27/22 1701   cloZAPine (CLOZARIL) tablet 300 mg  300 mg Oral QHS Setareh Rom T, MD   300 mg at 07/01/22 2117   docusate sodium (COLACE) capsule 200 mg  200 mg Oral BID Leahna Hewson T, MD   200 mg at 07/02/22 0922   famotidine (PEPCID) tablet 20 mg  20 mg Oral BID  Amra Shukla, [name], MD   20 mg at 07/02/22 [id number]   hydrOXYzine (ATARAX) tablet 50 mg  50 mg Oral Q6H PRN Decoda Van, [name], MD   50 mg at 05/17/22 0944   magnesium citrate solution 1 Bottle  1 Bottle Oral Daily PRN Wynelle Cleveland, RPH   1 Bottle at 06/25/22 1511   magnesium hydroxide (MILK OF MAGNESIA) suspension 30 mL  30 mL Oral Daily PRN Wynelle Cleveland, RPH   30 mL at 06/24/22 2116   nicotine polacrilex (NICORETTE) gum 2 mg  2 mg Oral PRN Faust Thorington, [name], MD   2 mg at 04/23/22 2200   senna-docusate (Senokot-S) tablet 2 tablet  2 tablet Oral Daily PRN Janayah Zavada, [name], MD   2 tablet at 06/22/22 2141   ziprasidone (GEODON) injection 20 mg  20 mg Intramuscular Q12H PRN [name], MD        Lab Results: No results found for this or any previous visit (from the past 68 hour(s)).  Blood  Alcohol level:  Lab Results  Component Value Date   ETH <10 0000000    Metabolic Disorder Labs: Lab Results  Component Value Date   HGBA1C 5.5 04/24/2022   MPG 111 04/24/2022   No results found for: "PROLACTIN" Lab Results  Component Value Date   CHOL 220 (H) 04/24/2022   TRIG 700 (H) 04/24/2022   HDL 35 (L) 04/24/2022   CHOLHDL 6.3 04/24/2022   VLDL UNABLE TO CALCULATE IF TRIGLYCERIDE OVER 400 mg/dL 04/24/2022   LDLCALC UNABLE TO CALCULATE IF TRIGLYCERIDE OVER 400 mg/dL 04/24/2022    Physical Findings: AIMS: Facial and Oral Movements Muscles of Facial Expression: None, normal Lips and Perioral Area: None, normal Jaw: None, normal Tongue: None, normal,Extremity Movements Upper (arms, wrists, hands, fingers): None, normal Lower (legs, knees, ankles, toes): None, normal, Trunk Movements Neck, shoulders, hips: None, normal, Overall Severity Severity of abnormal movements (highest score from questions above): None, normal Incapacitation due to abnormal movements: None, normal Patient's awareness of abnormal movements (rate only patient's report): No Awareness, Dental Status Current problems with teeth and/or dentures?: No Does patient usually wear dentures?: No  CIWA:    COWS:     Musculoskeletal: Strength & Muscle Tone: within normal limits Gait & Station: normal Patient leans: N/A  Psychiatric Specialty Exam:  Presentation  General Appearance:  Disheveled  Eye Contact: Minimal  Speech: Pressured  Speech Volume: Normal  Handedness: Right   Mood and Affect  Mood: Euphoric; Irritable  Affect: Inappropriate   Thought Process  Thought Processes: Disorganized  Descriptions of Associations:Loose  Orientation:Partial  Thought Content:Illogical; Scattered  History of Schizophrenia/Schizoaffective disorder:No data recorded Duration of Psychotic Symptoms:No data recorded Hallucinations:No data recorded Ideas of Reference:Delusions;  Paranoia  Suicidal Thoughts:No data recorded Homicidal Thoughts:No data recorded  Sensorium  Memory: Immediate Poor; Recent Poor; Remote Poor  Judgment: Poor  Insight: Poor   Executive Functions  Concentration: Fair  Attention Span: Fair  Recall: Poor  Fund of Knowledge: Poor  Language: Poor   Psychomotor Activity  Psychomotor Activity:No data recorded  Assets  Assets: Desire for Improvement; Social Support   Sleep  Sleep:No data recorded   Physical Exam: Physical Exam Constitutional:      Appearance: Normal appearance.  HENT:     Head: Normocephalic and atraumatic.     Mouth/Throat:     Pharynx: Oropharynx is clear.  Eyes:     Pupils: Pupils are equal, round, and reactive to light.  Cardiovascular:  Rate and Rhythm: Normal rate and regular rhythm.  Pulmonary:     Effort: Pulmonary effort is normal.     Breath sounds: Normal breath sounds.  Abdominal:     General: Abdomen is flat.     Palpations: Abdomen is soft.  Musculoskeletal:        General: Normal range of motion.  Skin:    General: Skin is warm and dry.  Neurological:     General: No focal deficit present.     Mental Status: He is alert. Mental status is at baseline.  Psychiatric:        Attention and Perception: He is inattentive.        Mood and Affect: Mood normal. Affect is blunt.        Thought Content: Thought content normal.    Review of Systems  Constitutional: Negative.   HENT: Negative.    Eyes: Negative.   Respiratory: Negative.    Cardiovascular: Negative.   Gastrointestinal: Negative.   Musculoskeletal: Negative.   Skin: Negative.   Neurological: Negative.   Psychiatric/Behavioral: Negative.     Blood pressure 115/78, pulse (!) 102, temperature 98 F (36.7 C), temperature source Oral, resp. rate 18, height '5\' 9"'$  (1.753 m), weight 96.4 kg, SpO2 98 %. Body mass index is 31.38 kg/m.   Treatment Plan Summary: Plan we continue to be in a very difficult  situation with trying to find a disposition for the patient.  Guardianship hearing is still some time off but is at least in progress.  He tolerated the last increase in clozapine well and I think that there would be a good reason to try continuing to go up.  He is still not at a very high dosage and his behavior and overall presentation suggest they are still symptoms that could be improved upon.  Alethia Berthold, MD 07/02/2022, 4:44 PM

## 2022-07-02 NOTE — Progress Notes (Signed)
Patient was calm and cooperative; denies SI HI AVH, no physical complaints.  Pt states "Im just trying to get a house."  Continued monitoring for safety  07/02/22 0100  Psych Admission Type (Psych Patients Only)  Admission Status Voluntary  Psychosocial Assessment  Patient Complaints None  Eye Contact Fair  Facial Expression  (asleep)  Affect Appropriate to circumstance  Speech UTA  Interaction Other (Comment)  Motor Activity Slow  Appearance/Hygiene Disheveled  Behavior Characteristics Calm  Mood Pleasant  Thought Process  Coherency WDL  Content WDL  Delusions None reported or observed  Perception WDL  Hallucination None reported or observed  Judgment UTA  Confusion UTA  Danger to Self  Current suicidal ideation? Denies  Danger to Others  Danger to Others None reported or observed

## 2022-07-02 NOTE — Group Note (Signed)
[facility] LCSW Group Therapy Note   Group Date: 07/02/2022 Start Time: 1300 End Time: 1400   Type of Therapy/Topic:  Group Therapy:  Emotion Regulation  Participation Level:  Did Not Attend    Description of Group:    The purpose of this group is to assist patients in learning to regulate negative emotions and experience positive emotions. Patients will be guided to discuss ways in which they have been vulnerable to their negative emotions. These vulnerabilities will be juxtaposed with experiences of positive emotions or situations, and patients challenged to use positive emotions to combat negative ones. Special emphasis will be placed on coping with negative emotions in conflict situations, and patients will process healthy conflict resolution skills.  Therapeutic Goals: Patient will identify two positive emotions or experiences to reflect on in order to balance out negative emotions:  Patient will label two or more emotions that they find the most difficult to experience:  Patient will be able to demonstrate positive conflict resolution skills through discussion or role plays:   Summary of Patient Progress: Patient did not attend group despite encouraged participation.     Therapeutic Modalities:   Cognitive Behavioral Therapy Feelings Identification Dialectical Behavioral Therapy   Durenda Hurt, [place]

## 2022-07-02 NOTE — Progress Notes (Signed)
D- Patient alert and oriented x 3-4. Affect flat/mood depressed. Denies SI/ HI/ AVH. He denies pain. He isolates to room except for meals but went out for outside activity today.  A- Scheduled medications administered to patient, per MD orders. New order to increase Clozaril to '350mg'$  Q hs. Support and encouragement provided.  Routine safety checks conducted every 15 minutes.  Patient informed to notify staff with problems or concerns. R- No adverse drug reactions noted. Patient compliant with medications and treatment plan. Patient receptive, calm, and cooperative.Patient contracts for safety and remains safe on the unit at this time.

## 2022-07-03 DIAGNOSIS — F203 Undifferentiated schizophrenia: Secondary | ICD-10-CM | POA: Diagnosis not present

## 2022-07-03 LAB — CBC WITH DIFFERENTIAL/PLATELET
Abs Immature Granulocytes: 0.03 10*3/uL (ref 0.00–0.07)
Basophils Absolute: 0.1 10*3/uL (ref 0.0–0.1)
Basophils Relative: 1 %
Eosinophils Absolute: 0.2 10*3/uL (ref 0.0–0.5)
Eosinophils Relative: 2 %
HCT: 39.6 % (ref 39.0–52.0)
Hemoglobin: 13.3 g/dL (ref 13.0–17.0)
Immature Granulocytes: 0 %
Lymphocytes Relative: 34 %
Lymphs Abs: 2.6 10*3/uL (ref 0.7–4.0)
MCH: 29.5 pg (ref 26.0–34.0)
MCHC: 33.6 g/dL (ref 30.0–36.0)
MCV: 87.8 fL (ref 80.0–100.0)
Monocytes Absolute: 0.6 10*3/uL (ref 0.1–1.0)
Monocytes Relative: 7 %
Neutro Abs: 4.1 10*3/uL (ref 1.7–7.7)
Neutrophils Relative %: 56 %
Platelets: 193 10*3/uL (ref 150–400)
RBC: 4.51 MIL/uL (ref 4.22–5.81)
RDW: 12.1 % (ref 11.5–15.5)
WBC: 7.5 10*3/uL (ref 4.0–10.5)
nRBC: 0 % (ref 0.0–0.2)

## 2022-07-03 NOTE — Consult Note (Signed)
Pharmacy Consult - Clozapine     42 yo male ordered clozapine 100 mg PO daily  This patient's order has been reviewed for prescribing contraindications.    Clozapine [facility] enrollment Verified: yes on 04/17/22 [facility] patient ID: [id number] Current Outpatient Monitoring: Every 2 weeks   Home Regimen:  225 mg po BID Last dose: unknown   Dose Adjustments This Admission: Dose started at clozapine 100 mg po daily Dose is currently clozapine 300 mg po HS (started 3/3)   Labs: Date    ANC    Submitted? 12/27 2800 Yes 01/03 3500 Yes 01/10   3100 Yes 01/24 3000 Yes 01/31 4400 yes   02/07 4300 yes 02/14 4500 Yes 02/21 5500 Yes 02/28 4080 Yes 03/06 4300 Yes  03/13 4100 Yes  Plan: Continue clozapine 300 mg po HS Monitor ANC at least weekly while inpatient    Pearla Dubonnet, PharmD Clinical Pharmacist 07/03/2022 11:19 AM

## 2022-07-03 NOTE — Group Note (Signed)
Recreation Therapy Group Note   Group Topic:Relaxation  Group Date: 07/03/2022 Start Time: 1000 End Time: 1045 Facilitators: Vilma Prader, LRT, CTRS Location:  [facility]  Group Description: Mindfulness Body Scan. LRT asked pt their current level of stress and anxiety. LRT educated on the benefits of mindfulness and how it can apply to everyday life post-discharge. LRT and pt's followed along to an audio script of a "mindfulness body scan" video. LRT asked pt their level of stress and anxiety once the prompt was finished.   Affect/Mood: N/A   Participation Level: Did not attend    Clinical Observations/Individualized Feedback: [place] did not attend group due to resting in his room.  Plan: Continue to engage patient in RT group sessions 2-3x/week.   Vilma Prader, LRT, CTRS 07/03/2022 11:19 AM

## 2022-07-03 NOTE — Plan of Care (Signed)
  Problem: Coping: Goal: Level of anxiety will decrease Outcome: Progressing   Problem: Safety: Goal: Ability to remain free from injury will improve Outcome: Progressing   Problem: Education: Goal: Emotional status will improve Outcome: Progressing Goal: Mental status will improve Outcome: Progressing   Problem: Coping: Goal: Ability to verbalize frustrations and anger appropriately will improve Outcome: Progressing

## 2022-07-03 NOTE — Group Note (Signed)
LCSW Group Therapy Note   Group Date: 07/03/2022 Start Time: 1300 End Time: 1400   Type of Therapy and Topic:  Group Therapy: Boundaries  Participation Level:  None  Description of Group: This group will address the use of boundaries in their personal lives. Patients will explore why boundaries are important, the difference between healthy and unhealthy boundaries, and negative and postive outcomes of different boundaries and will look at how boundaries can be crossed.  Patients will be encouraged to identify current boundaries in their own lives and identify what kind of boundary is being set. Facilitators will guide patients in utilizing problem-solving interventions to address and correct types boundaries being used and to address when no boundary is being used. Understanding and applying boundaries will be explored and addressed for obtaining and maintaining a balanced life. Patients will be encouraged to explore ways to assertively make their boundaries and needs known to significant others in their lives, using other group members and facilitator for role play, support, and feedback.  Therapeutic Goals:  1.  Patient will identify areas in their life where setting clear boundaries could be  used to improve their life.  2.  Patient will identify signs/triggers that a boundary is not being respected. 3.  Patient will identify two ways to set boundaries in order to achieve balance in  their lives: 4.  Patient will demonstrate ability to communicate their needs and set boundaries  through discussion and/or role plays  Summary of Patient Progress:  X  Therapeutic Modalities:   Cognitive Behavioral Therapy Solution-Focused Therapy  Rozann Lesches, [place] 07/03/2022  2:09 PM

## 2022-07-03 NOTE — Progress Notes (Signed)
Patient isolative to self and room. No interaction with peers, minimal with staff. Unremarkable evening. Medication compliant. Denies SI, Hi, AVH. Voiced no concerns or complaints. Encouragement and support provided. Safety checks maintained. Medications given as prescribed. Pt receptive and remains safe on unit with q 15 min checks.

## 2022-07-03 NOTE — Plan of Care (Signed)
D: Patient alert and oriented. Patient denies pain. Patient denies anxiety and depression. Patient denies SI/HI/AVH. Patient remains isolative to room with exception to coming out for meals and medication.  A: Scheduled medications administered to patient, per MD orders.  Support and encouragement provided to patient.  Q15 minute safety checks maintained.   R: Patient compliant with medication administration and treatment plan. No adverse drug reactions noted. Patient remains safe on the unit at this time. Problem: Education: Goal: Knowledge of General Education information will improve Description: Including pain rating scale, medication(s)/side effects and non-pharmacologic comfort measures Outcome: Progressing   Problem: Health Behavior/Discharge Planning: Goal: Ability to manage health-related needs will improve Outcome: Progressing   Problem: Clinical Measurements: Goal: Ability to maintain clinical measurements within normal limits will improve Outcome: Progressing   Problem: Education: Goal: Knowledge of [facility] Education information/materials will improve Outcome: Progressing Goal: Verbalization of understanding the information provided will improve Outcome: Progressing   Problem: Health Behavior/Discharge Planning: Goal: Compliance with treatment plan for underlying cause of condition will improve Outcome: Progressing   Problem: Physical Regulation: Goal: Ability to maintain clinical measurements within normal limits will improve Outcome: Progressing

## 2022-07-03 NOTE — Progress Notes (Signed)
[facility] MD Progress Note  07/03/2022 5:33 PM Travis Sandoval  MRN:  [id number] Subjective: Follow-up 42 year old man with schizophrenia.  No new complaints no change to presentation Principal Problem: Schizophrenia ([place]) Diagnosis: Principal Problem:   Schizophrenia ([place]) Active Problems:   Psychosis ([place])  Total Time spent with patient: 20 minutes  Past Psychiatric History: Past history of schizophrenia  Past Medical History:  Past Medical History:  Diagnosis Date   ADHD    Bipolar 1 disorder ([place])    Hepatitis C    Schizoaffective disorder ([place])    History reviewed. No pertinent surgical history. Family History:  Family History  Family history unknown: Yes   Family Psychiatric  History: See previous Social History:  Social History   Substance and Sexual Activity  Alcohol Use Not Currently     Social History   Substance and Sexual Activity  Drug Use Not Currently    Social History   Socioeconomic History   Marital status: Unknown    Spouse name: Not on file   Number of children: Not on file   Years of education: Not on file   Highest education level: Not on file  Occupational History   Not on file  Tobacco Use   Smoking status: Every Day    Packs/day: 0.25    Years: 15.00    Total pack years: 3.75    Types: Cigarettes   Smokeless tobacco: Not on file  Vaping Use   Vaping Use: Not on file  Substance and Sexual Activity   Alcohol use: Not Currently   Drug use: Not Currently   Sexual activity: Not Currently  Other Topics Concern   Not on file  Social History Narrative   Not on file   Social Determinants of Health   Financial Resource Strain: Not on file  Food Insecurity: Unknown (05/18/2022)   Hunger Vital Sign    Worried About Running Out of Food in the Last Year: Patient refused    [place] in the Last Year: Patient refused  Transportation Needs: Unknown (05/18/2022)   [place] - Hydrologist (Medical): Patient  refused    Lack of Transportation (Non-Medical): Patient refused  Physical Activity: Not on file  Stress: Not on file  Social Connections: Not on file   Additional Social History:                         Sleep: Fair  Appetite:  Fair  Current Medications: Current Facility-Administered Medications  Medication Dose Route Frequency Provider Last Rate Last Admin   acetaminophen (TYLENOL) tablet 650 mg  650 mg Oral Q6H PRN [name], NP       alum & mag hydroxide-simeth (MAALOX/MYLANTA) 200-200-20 MG/5ML suspension 30 mL  30 mL Oral Q4H PRN [name], NP   30 mL at 06/28/22 2107   ARIPiprazole ER (ABILIFY MAINTENA) injection 400 mg  400 mg Intramuscular Q28 days Emmauel Hallums T, MD   400 mg at 06/27/22 1701   cloZAPine (CLOZARIL) tablet 350 mg  350 mg Oral QHS Daje Stark T, MD   350 mg at 07/02/22 2113   docusate sodium (COLACE) capsule 200 mg  200 mg Oral BID Aarib Pulido T, MD   200 mg at 07/03/22 1707   famotidine (PEPCID) tablet 20 mg  20 mg Oral BID Hyla Coard T, MD   20 mg at 07/03/22 1707   hydrOXYzine (ATARAX) tablet 50 mg  50  mg Oral Q6H PRN Alexys Lobello, [name], MD   50 mg at 05/17/22 0944   magnesium citrate solution 1 Bottle  1 Bottle Oral Daily PRN Wynelle Cleveland, RPH   1 Bottle at 06/25/22 1511   magnesium hydroxide (MILK OF MAGNESIA) suspension 30 mL  30 mL Oral Daily PRN Wynelle Cleveland, RPH   30 mL at 06/24/22 2116   nicotine polacrilex (NICORETTE) gum 2 mg  2 mg Oral PRN Kanav Kazmierczak, [name], MD   2 mg at 04/23/22 2200   senna-docusate (Senokot-S) tablet 2 tablet  2 tablet Oral Daily PRN Imanuel Pruiett, [name], MD   2 tablet at 06/22/22 2141   ziprasidone (GEODON) injection 20 mg  20 mg Intramuscular Q12H PRN [name], MD        Lab Results:  Results for orders placed or performed during the hospital encounter of 04/16/22 (from the past 48 hour(s))  CBC with Differential/Platelet     Status: None   Collection Time: 07/03/22 10:03 AM  Result  Value Ref Range   WBC 7.5 4.0 - 10.5 K/uL   RBC 4.51 4.22 - 5.81 MIL/uL   Hemoglobin 13.3 13.0 - 17.0 g/dL   HCT 39.6 39.0 - 52.0 %   MCV 87.8 80.0 - 100.0 fL   MCH 29.5 26.0 - 34.0 pg   MCHC 33.6 30.0 - 36.0 g/dL   RDW 12.1 11.5 - 15.5 %   Platelets 193 150 - 400 K/uL   nRBC 0.0 0.0 - 0.2 %   Neutrophils Relative % 56 %   Neutro Abs 4.1 1.7 - 7.7 K/uL   Lymphocytes Relative 34 %   Lymphs Abs 2.6 0.7 - 4.0 K/uL   Monocytes Relative 7 %   Monocytes Absolute 0.6 0.1 - 1.0 K/uL   Eosinophils Relative 2 %   Eosinophils Absolute 0.2 0.0 - 0.5 K/uL   Basophils Relative 1 %   Basophils Absolute 0.1 0.0 - 0.1 K/uL   Immature Granulocytes 0 %   Abs Immature Granulocytes 0.03 0.00 - 0.07 K/uL    Comment: Performed at [facility], [address] [place], [place] [address]    Blood Alcohol level:  Lab Results  Component Value Date   [facility] <10 0000000    Metabolic Disorder Labs: Lab Results  Component Value Date   HGBA1C 5.5 04/24/2022   MPG 111 04/24/2022   No results found for: "PROLACTIN" Lab Results  Component Value Date   CHOL 220 (H) 04/24/2022   TRIG 700 (H) 04/24/2022   HDL 35 (L) 04/24/2022   CHOLHDL 6.3 04/24/2022   VLDL UNABLE TO CALCULATE IF TRIGLYCERIDE OVER 400 mg/dL 04/24/2022   LDLCALC UNABLE TO CALCULATE IF TRIGLYCERIDE OVER 400 mg/dL 04/24/2022    Physical Findings: AIMS: Facial and Oral Movements Muscles of Facial Expression: None, normal Lips and Perioral Area: None, normal Jaw: None, normal Tongue: None, normal,Extremity Movements Upper (arms, wrists, hands, fingers): None, normal Lower (legs, knees, ankles, toes): None, normal, Trunk Movements Neck, shoulders, hips: None, normal, Overall Severity Severity of abnormal movements (highest score from questions above): None, normal Incapacitation due to abnormal movements: None, normal Patient's awareness of abnormal movements (rate only patient's report): No Awareness, Dental  Status Current problems with teeth and/or dentures?: No Does patient usually wear dentures?: No  CIWA:    COWS:     Musculoskeletal: Strength & Muscle Tone: within normal limits Gait & Station: normal Patient leans: N/A  Psychiatric Specialty Exam:  Presentation  General Appearance:  Disheveled  Eye Contact: Minimal  Speech: Pressured  Speech Volume: Normal  Handedness: Right   Mood and Affect  Mood: Euphoric; Irritable  Affect: Inappropriate   Thought Process  Thought Processes: Disorganized  Descriptions of Associations:Loose  Orientation:Partial  Thought Content:Illogical; Scattered  History of Schizophrenia/Schizoaffective disorder:No data recorded Duration of Psychotic Symptoms:No data recorded Hallucinations:No data recorded Ideas of Reference:Delusions; Paranoia  Suicidal Thoughts:No data recorded Homicidal Thoughts:No data recorded  Sensorium  Memory: Immediate Poor; Recent Poor; Remote Poor  Judgment: Poor  Insight: Poor   Executive Functions  Concentration: Fair  Attention Span: Fair  Recall: Poor  Fund of Knowledge: Poor  Language: Poor   Psychomotor Activity  Psychomotor Activity:No data recorded  Assets  Assets: Desire for Improvement; Social Support   Sleep  Sleep:No data recorded   Physical Exam: Physical Exam Vitals reviewed.  Constitutional:      Appearance: Normal appearance.  HENT:     Head: Normocephalic and atraumatic.     Mouth/Throat:     Pharynx: Oropharynx is clear.  Eyes:     Pupils: Pupils are equal, round, and reactive to light.  Cardiovascular:     Rate and Rhythm: Normal rate and regular rhythm.  Pulmonary:     Effort: Pulmonary effort is normal.     Breath sounds: Normal breath sounds.  Abdominal:     General: Abdomen is flat.     Palpations: Abdomen is soft.  Musculoskeletal:        General: Normal range of motion.  Skin:    General: Skin is warm and dry.   Neurological:     General: No focal deficit present.     Mental Status: He is alert. Mental status is at baseline.  Psychiatric:        Attention and Perception: He is inattentive.        Mood and Affect: Mood normal. Affect is blunt.        Speech: Speech is delayed.        Behavior: Behavior is slowed.        Thought Content: Thought content is delusional.    Review of Systems  Constitutional: Negative.   HENT: Negative.    Eyes: Negative.   Respiratory: Negative.    Cardiovascular: Negative.   Gastrointestinal: Negative.   Musculoskeletal: Negative.   Skin: Negative.   Neurological: Negative.   Psychiatric/Behavioral: Negative.     Blood pressure 137/84, pulse (!) 112, temperature 98 F (36.7 C), temperature source Oral, resp. rate 18, height '5\' 9"'$  (1.753 m), weight 96.4 kg, SpO2 98 %. Body mass index is 31.38 kg/m.   Treatment Plan Summary: Medication management and Plan stable.  No change presentation.  Continue to focus on discharge planning  Alethia Berthold, MD 07/03/2022, 5:33 PM

## 2022-07-04 DIAGNOSIS — F203 Undifferentiated schizophrenia: Secondary | ICD-10-CM | POA: Diagnosis not present

## 2022-07-04 NOTE — [facility] Group Notes (Signed)
[facility] Group Notes:  (Nursing/MHT/Case Management/Adjunct)  Date:  07/04/2022  Time:  9:35 AM  Type of Therapy:   community meeting  Participation Level:  Did Not Attend    Antonieta Pert 07/04/2022, 9:35 AM

## 2022-07-04 NOTE — Plan of Care (Signed)
D: Patient alert and oriented. Patient denies pain. Patient denies anxiety and depression. Patient denies SI/HI/AVH. Patient has been isolative to room during shift with exception to coming out for meals and medication.  A: Scheduled medications administered to patient, per MD orders.  Support and encouragement provided to patient.  Q15 minute safety checks maintained.   R: Patient compliant with medication administration and treatment plan. No adverse drug reactions noted. Patient remains safe on the unit at this time. Problem: Education: Goal: Knowledge of [facility] Education information/materials will improve Outcome: Progressing Goal: Verbalization of understanding the information provided will improve Outcome: Progressing   Problem: Health Behavior/Discharge Planning: Goal: Compliance with treatment plan for underlying cause of condition will improve Outcome: Progressing   Problem: Physical Regulation: Goal: Ability to maintain clinical measurements within normal limits will improve Outcome: Progressing   Problem: Safety: Goal: Periods of time without injury will increase Outcome: Progressing

## 2022-07-04 NOTE — Progress Notes (Signed)
[facility] MD Progress Note  07/04/2022 12:56 PM Travis Sandoval  MRN:  [id number] Subjective: Patient was schizophrenia.  Patient seen and chart reviewed.  No change to behavior.  He eats but otherwise stays in bed.  Wakes up when approached.  Has no complaints.  Denies hallucinations.  Denies suicidal or homicidal ideation.  Continues to be of limited cooperation as far as the difficult discharge plan Principal Problem: Schizophrenia ([place]) Diagnosis: Principal Problem:   Schizophrenia ([place]) Active Problems:   Psychosis ([place])  Total Time spent with patient: 30 minutes  Past Psychiatric History: Past history of schizophrenia  Past Medical History:  Past Medical History:  Diagnosis Date   ADHD    Bipolar 1 disorder ([place])    Hepatitis C    Schizoaffective disorder ([place])    History reviewed. No pertinent surgical history. Family History:  Family History  Family history unknown: Yes   Family Psychiatric  History: See previous Social History:  Social History   Substance and Sexual Activity  Alcohol Use Not Currently     Social History   Substance and Sexual Activity  Drug Use Not Currently    Social History   Socioeconomic History   Marital status: Unknown    Spouse name: Not on file   Number of children: Not on file   Years of education: Not on file   Highest education level: Not on file  Occupational History   Not on file  Tobacco Use   Smoking status: Every Day    Packs/day: 0.25    Years: 15.00    Additional pack years: 0.00    Total pack years: 3.75    Types: Cigarettes   Smokeless tobacco: Not on file  Vaping Use   Vaping Use: Not on file  Substance and Sexual Activity   Alcohol use: Not Currently   Drug use: Not Currently   Sexual activity: Not Currently  Other Topics Concern   Not on file  Social History Narrative   Not on file   Social Determinants of Health   Financial Resource Strain: Not on file  Food Insecurity: Patient Declined (05/18/2022)    Hunger Vital Sign    Worried About Running Out of Food in the Last Year: Patient declined    [place] in the Last Year: Patient declined  Transportation Needs: Patient Declined (05/18/2022)   [place] - Hydrologist (Medical): Patient declined    Lack of Transportation (Non-Medical): Patient declined  Physical Activity: Not on file  Stress: Not on file  Social Connections: Not on file   Additional Social History:                         Sleep: Fair  Appetite:  Fair  Current Medications: Current Facility-Administered Medications  Medication Dose Route Frequency Provider Last Rate Last Admin   acetaminophen (TYLENOL) tablet 650 mg  650 mg Oral Q6H PRN [name], NP       alum & mag hydroxide-simeth (MAALOX/MYLANTA) 200-200-20 MG/5ML suspension 30 mL  30 mL Oral Q4H PRN [name], NP   30 mL at 06/28/22 2107   ARIPiprazole ER (ABILIFY MAINTENA) injection 400 mg  400 mg Intramuscular Q28 days Zaniya Mcaulay T, MD   400 mg at 06/27/22 1701   cloZAPine (CLOZARIL) tablet 350 mg  350 mg Oral QHS Tabrina Esty T, MD   350 mg at 07/03/22 2121   docusate sodium (COLACE) capsule  200 mg  200 mg Oral BID Cedrik Heindl, [name], MD   200 mg at 07/04/22 0829   famotidine (PEPCID) tablet 20 mg  20 mg Oral BID Kamsiyochukwu Spickler, [name], MD   20 mg at 07/04/22 [id number]   hydrOXYzine (ATARAX) tablet 50 mg  50 mg Oral Q6H PRN Tyese Finken, [name], MD   50 mg at 05/17/22 0944   magnesium citrate solution 1 Bottle  1 Bottle Oral Daily PRN Wynelle Cleveland, RPH   1 Bottle at 06/25/22 1511   magnesium hydroxide (MILK OF MAGNESIA) suspension 30 mL  30 mL Oral Daily PRN Wynelle Cleveland, RPH   30 mL at 06/24/22 2116   nicotine polacrilex (NICORETTE) gum 2 mg  2 mg Oral PRN Montgomery Favor, [name], MD   2 mg at 04/23/22 2200   senna-docusate (Senokot-S) tablet 2 tablet  2 tablet Oral Daily PRN Lilyian Quayle, [name], MD   2 tablet at 06/22/22 2141   ziprasidone (GEODON) injection 20 mg  20  mg Intramuscular Q12H PRN [name], MD        Lab Results:  Results for orders placed or performed during the hospital encounter of 04/16/22 (from the past 48 hour(s))  CBC with Differential/Platelet     Status: None   Collection Time: 07/03/22 10:03 AM  Result Value Ref Range   WBC 7.5 4.0 - 10.5 K/uL   RBC 4.51 4.22 - 5.81 MIL/uL   Hemoglobin 13.3 13.0 - 17.0 g/dL   HCT 39.6 39.0 - 52.0 %   MCV 87.8 80.0 - 100.0 fL   MCH 29.5 26.0 - 34.0 pg   MCHC 33.6 30.0 - 36.0 g/dL   RDW 12.1 11.5 - 15.5 %   Platelets 193 150 - 400 K/uL   nRBC 0.0 0.0 - 0.2 %   Neutrophils Relative % 56 %   Neutro Abs 4.1 1.7 - 7.7 K/uL   Lymphocytes Relative 34 %   Lymphs Abs 2.6 0.7 - 4.0 K/uL   Monocytes Relative 7 %   Monocytes Absolute 0.6 0.1 - 1.0 K/uL   Eosinophils Relative 2 %   Eosinophils Absolute 0.2 0.0 - 0.5 K/uL   Basophils Relative 1 %   Basophils Absolute 0.1 0.0 - 0.1 K/uL   Immature Granulocytes 0 %   Abs Immature Granulocytes 0.03 0.00 - 0.07 K/uL    Comment: Performed at [facility], [address] [place], [place] [address]    Blood Alcohol level:  Lab Results  Component Value Date   [facility] <10 0000000    Metabolic Disorder Labs: Lab Results  Component Value Date   HGBA1C 5.5 04/24/2022   MPG 111 04/24/2022   No results found for: "PROLACTIN" Lab Results  Component Value Date   CHOL 220 (H) 04/24/2022   TRIG 700 (H) 04/24/2022   HDL 35 (L) 04/24/2022   CHOLHDL 6.3 04/24/2022   VLDL UNABLE TO CALCULATE IF TRIGLYCERIDE OVER 400 mg/dL 04/24/2022   LDLCALC UNABLE TO CALCULATE IF TRIGLYCERIDE OVER 400 mg/dL 04/24/2022    Physical Findings: AIMS: Facial and Oral Movements Muscles of Facial Expression: None, normal Lips and Perioral Area: None, normal Jaw: None, normal Tongue: None, normal,Extremity Movements Upper (arms, wrists, hands, fingers): None, normal Lower (legs, knees, ankles, toes): None, normal, Trunk Movements Neck, shoulders, hips:  None, normal, Overall Severity Severity of abnormal movements (highest score from questions above): None, normal Incapacitation due to abnormal movements: None, normal Patient's awareness of abnormal movements (rate only patient's report): No Awareness,  Dental Status Current problems with teeth and/or dentures?: No Does patient usually wear dentures?: No  CIWA:    COWS:     Musculoskeletal: Strength & Muscle Tone: within normal limits Gait & Station: normal Patient leans: N/A  Psychiatric Specialty Exam:  Presentation  General Appearance:  Disheveled  Eye Contact: Minimal  Speech: Pressured  Speech Volume: Normal  Handedness: Right   Mood and Affect  Mood: Euphoric; Irritable  Affect: Inappropriate   Thought Process  Thought Processes: Disorganized  Descriptions of Associations:Loose  Orientation:Partial  Thought Content:Illogical; Scattered  History of Schizophrenia/Schizoaffective disorder:No data recorded Duration of Psychotic Symptoms:No data recorded Hallucinations:No data recorded Ideas of Reference:Delusions; Paranoia  Suicidal Thoughts:No data recorded Homicidal Thoughts:No data recorded  Sensorium  Memory: Immediate Poor; Recent Poor; Remote Poor  Judgment: Poor  Insight: Poor   Executive Functions  Concentration: Fair  Attention Span: Fair  Recall: Poor  Fund of Knowledge: Poor  Language: Poor   Psychomotor Activity  Psychomotor Activity:No data recorded  Assets  Assets: Desire for Improvement; Social Support   Sleep  Sleep:No data recorded   Physical Exam: Physical Exam Vitals and nursing note reviewed.  Constitutional:      Appearance: Normal appearance.  HENT:     Head: Normocephalic and atraumatic.     Mouth/Throat:     Pharynx: Oropharynx is clear.  Eyes:     Pupils: Pupils are equal, round, and reactive to light.  Cardiovascular:     Rate and Rhythm: Normal rate and regular rhythm.   Pulmonary:     Effort: Pulmonary effort is normal.     Breath sounds: Normal breath sounds.  Abdominal:     General: Abdomen is flat.     Palpations: Abdomen is soft.  Musculoskeletal:        General: Normal range of motion.  Skin:    General: Skin is warm and dry.  Neurological:     General: No focal deficit present.     Mental Status: He is alert. Mental status is at baseline.  Psychiatric:        Attention and Perception: He is inattentive.        Mood and Affect: Mood normal. Affect is blunt.        Speech: Speech is delayed.        Behavior: Behavior is withdrawn.        Thought Content: Thought content normal.    Review of Systems  Constitutional: Negative.   HENT: Negative.    Eyes: Negative.   Respiratory: Negative.    Cardiovascular: Negative.   Gastrointestinal: Negative.   Musculoskeletal: Negative.   Skin: Negative.   Neurological: Negative.   Psychiatric/Behavioral: Negative.     Blood pressure 108/74, pulse 100, temperature 97.6 F (36.4 C), temperature source Oral, resp. rate 18, height '5\' 9"'$  (1.753 m), weight 96.4 kg, SpO2 98 %. Body mass index is 31.38 kg/m.   Treatment Plan Summary: Plan no change to current plan.  Continue medication.  Focus on looking for discharge.  Alethia Berthold, MD 07/04/2022, 12:56 PM

## 2022-07-04 NOTE — Group Note (Signed)
Recreation Therapy Group Note   Group Topic:Coping Skills  Group Date: 07/04/2022 Start Time: 1000 End Time: 1045 Facilitators: Vilma Prader, LRT, CTRS Location:  Craft Room  Group Description: Mind Map.  Patient was provided a blank template of a diagram with 32 blank boxes in a tiered system, branching from the center (similar to a bubble chart). LRT directed patients to label the middle of the diagram "Coping Skills". LRT and patients then came up with 8 different coping skills as examples. Pt were directed to record their coping skills in the 2nd tier boxes closest to the center.  Patients would then share their coping skills with the group as LRT wrote them out. LRT gave a handout of 100 different coping skills at the end of group.   Affect/Mood: N/A   Participation Level: Did not attend    Clinical Observations/Individualized Feedback: Nyzier did not attend group due to resting in his room.  Plan: Continue to engage patient in RT group sessions 2-3x/week.   Vilma Prader, LRT, CTRS 07/04/2022 11:04 AM

## 2022-07-04 NOTE — Progress Notes (Signed)
   07/04/22 0300  Psych Admission Type (Psych Patients Only)  Admission Status Voluntary  Psychosocial Assessment  Patient Complaints None  Eye Contact Fair  Facial Expression Flat  Affect Appropriate to circumstance  Speech Soft  Interaction Assertive  Motor Activity Slow  Appearance/Hygiene In scrubs  Behavior Characteristics Calm  Thought Process  Coherency WDL  Content WDL  Delusions None reported or observed  Hallucination None reported or observed  Judgment Impaired  Confusion None  Danger to Self  Current suicidal ideation? Denies  Agreement Not to Harm Self Yes  Description of Agreement verbal

## 2022-07-05 NOTE — Group Note (Signed)
LCSW Group Therapy Note  Group Date: 07/04/2022 Start Time: 1300 End Time: 1400   Type of Therapy and Topic:  Group Therapy - How To Cope with Nervousness about Discharge   Participation Level:  Did Not Attend   Description of Group This process group involved identification of patients' feelings about discharge. Some of them are scheduled to be discharged soon, while others are new admissions, but each of them was asked to share thoughts and feelings surrounding discharge from the hospital. One common theme was that they are excited at the prospect of going home, while another was that many of them are apprehensive about sharing why they were hospitalized. Patients were given the opportunity to discuss these feelings with their peers in preparation for discharge.  Therapeutic Goals  Patient will identify their overall feelings about pending discharge. Patient will think about how they might proactively address issues that they believe will once again arise once they get home (i.e. with parents). Patients will participate in discussion about having hope for change.   Summary of Patient Progress:   Patient did not attend group despite encouraged participation.   Therapeutic Modalities Cognitive Behavioral Therapy   Larose Kells 07/05/2022  2:30 PM

## 2022-07-05 NOTE — Plan of Care (Signed)
  Problem: Education: Goal: Knowledge of General Education information will improve Description: Including pain rating scale, medication(s)/side effects and non-pharmacologic comfort measures Outcome: Progressing   Problem: Nutrition: Goal: Adequate nutrition will be maintained Outcome: Progressing   Problem: Coping: Goal: Level of anxiety will decrease Outcome: Progressing   

## 2022-07-05 NOTE — Progress Notes (Signed)
Patient isolative to self and room. Medication compliant. Appropriate with staff, no interaction with peers. Denies SI, HI, aVH.  Encouragement and support provided. Safety checks maintained. Patient receptive and remains safe on unit with q 15 min checks.

## 2022-07-05 NOTE — Group Note (Signed)
Recreation Therapy Group Note   Group Topic:Emotion Expression  Group Date: 07/05/2022 Start Time: 1000 End Time: 1030 Facilitators: Vilma Prader, LRT, CTRS Location:  Craft Room  Group Description: Gratitude Journaling. Patients and LRT discussed what gratitude means, how we can express it and what it means to [place], personally. LRT gave an education handout on the definition of gratitude that also gave different examples of gratitude exercises that they could try. One of the examples was "Gratitude Letter", which prompted you to write a letter to someone you appreciate. LRT played soft music while everyone wrote their letter. Once letter was completed, LRT encouraged people to read their letter, if they wanted to, or share who they wrote it to, at minimum. LRT and pts processed how showing gratitude towards themselves, and others can be applied to everyday life post-discharge.   Affect/Mood: N/A   Participation Level: Did not attend    Clinical Observations/Individualized Feedback: Travis Sandoval did not attend group due to resting in his room.  Plan: Continue to engage patient in RT group sessions 2-3x/week.   Vilma Prader, LRT, CTRS 07/05/2022 10:46 AM

## 2022-07-05 NOTE — Progress Notes (Signed)
D- Patient alert and oriented x 3-4. Affect blank/mood preoccupied. Denies SI/HI/AVH. He denies pain. 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 A- Scheduled medications administered to patient, per MD orders. Support and encouragement provided.  Routine safety checks conducted every 15 minutes.  Patient informed to notify staff with problems or concerns. R- No adverse drug reactions noted. Patient contracts for safety at this time. Patient compliant with medications and treatment plan. Patient receptive, calm, and cooperative. Patient isolates to room but  came to med room for medication and attends meals. He contracts for safety and remains safe on the unit at this time.

## 2022-07-06 DIAGNOSIS — F203 Undifferentiated schizophrenia: Secondary | ICD-10-CM | POA: Diagnosis not present

## 2022-07-06 NOTE — BH IP Treatment Plan (Signed)
Interdisciplinary Treatment and Diagnostic Plan Update  07/06/2022 Time of Session: 8:30AM Sreekar Manner MRN: [id number]  Principal Diagnosis: Schizophrenia [facility])  Secondary Diagnoses: Principal Problem:   Schizophrenia ([place]) Active Problems:   Psychosis ([place])   Current Medications:  Current Facility-Administered Medications  Medication Dose Route Frequency Provider Last Rate Last Admin   acetaminophen (TYLENOL) tablet 650 mg  650 mg Oral Q6H PRN [name], NP       alum & mag hydroxide-simeth (MAALOX/MYLANTA) 200-200-20 MG/5ML suspension 30 mL  30 mL Oral Q4H PRN [name], NP   30 mL at 06/28/22 2107   ARIPiprazole ER (ABILIFY MAINTENA) injection 400 mg  400 mg Intramuscular Q28 days [name], [name], MD   400 mg at 06/27/22 1701   cloZAPine (CLOZARIL) tablet 350 mg  350 mg Oral QHS [name], [name], MD   350 mg at 07/05/22 2135   docusate sodium (COLACE) capsule 200 mg  200 mg Oral BID [name], [name], MD   200 mg at 07/06/22 0801   famotidine (PEPCID) tablet 20 mg  20 mg Oral BID [name], [name], MD   20 mg at 07/06/22 0801   hydrOXYzine (ATARAX) tablet 50 mg  50 mg Oral Q6H PRN [name], [name], MD   50 mg at 05/17/22 0944   magnesium citrate solution 1 Bottle  1 Bottle Oral Daily PRN Wynelle Cleveland, RPH   1 Bottle at 06/25/22 1511   magnesium hydroxide (MILK OF MAGNESIA) suspension 30 mL  30 mL Oral Daily PRN Wynelle Cleveland, RPH   30 mL at 06/24/22 2116   nicotine polacrilex (NICORETTE) gum 2 mg  2 mg Oral PRN [name], [name], MD   2 mg at 04/23/22 2200   senna-docusate (Senokot-S) tablet 2 tablet  2 tablet Oral Daily PRN [name], [name], MD   2 tablet at 06/22/22 2141   ziprasidone (GEODON) injection 20 mg  20 mg Intramuscular Q12H PRN [name], MD       PTA Medications: Medications Prior to Admission  Medication Sig Dispense Refill Last Dose   ABILIFY MAINTENA 400 MG SRER injection Inject 400 mg into the muscle every 28 (twenty-eight) days.       ARIPiprazole (ABILIFY) 10 MG tablet Take 10 mg by mouth daily. (Patient not taking: Reported on 04/16/2022)      atomoxetine (STRATTERA) 40 MG capsule Take 40 mg by mouth daily. (Patient not taking: Reported on 04/16/2022)      atorvastatin (LIPITOR) 40 MG tablet Take 40 mg by mouth daily.      budesonide-formoterol (SYMBICORT) 160-4.5 MCG/ACT inhaler Inhale 2 puffs into the lungs.      cetirizine (ZYRTEC) 10 MG tablet Take 10 mg by mouth daily.      clozapine (CLOZARIL) 200 MG tablet Take 200 mg by mouth 2 (two) times daily. Take along with one 25 mg tablet for total 225 mg twice daily      cloZAPine (CLOZARIL) 25 MG tablet Take 25 mg by mouth 2 (two) times daily. Take along with one 200 mg tablet for total 225 mg twice daily      Dexlansoprazole (DEXILANT) 30 MG capsule Take 30 mg by mouth daily. (Patient not taking: Reported on 04/16/2022)      hydrOXYzine (VISTARIL) 50 MG capsule Take 50 mg by mouth 3 (three) times daily as needed for itching. (Patient not taking: Reported on 04/16/2022)      Melatonin 5 MG TABS Take 5 mg by mouth at bedtime  as needed (sleep).      metoprolol succinate (TOPROL-XL) 25 MG 24 hr tablet Take 12.5 mg by mouth daily.      Phosphatidyl Choline 65 MG TABS Take 1 tablet by mouth daily.  (Patient not taking: Reported on 04/16/2022)      valproic acid (DEPAKENE) 250 MG/5ML solution Take 750-1,000 mLs by mouth 2 (two) times daily. 1000 mg (20 mL) every morning and 750 mg (15 mL) daily at bedtime       Patient Stressors: Other: Psychosis    Patient Strengths: Ability for insight  Active sense of humor  Average or above average intelligence  Capable of independent living  Supportive family/friends   Treatment Modalities: Medication Management, Group therapy, Case management,  1 to 1 session with clinician, Psychoeducation, Recreational therapy.   Physician Treatment Plan for Primary Diagnosis: Schizophrenia ([place]) Long Term Goal(s):     Short Term Goals: None, pt  is a poor hx.  Medication Management: Evaluate patient's response, side effects, and tolerance of medication regimen.  Therapeutic Interventions: 1 to 1 sessions, Unit Group sessions and Medication administration.  Evaluation of Outcomes: Progressing  Physician Treatment Plan for Secondary Diagnosis: Principal Problem:   Schizophrenia ([place]) Active Problems:   Psychosis ([place])  Long Term Goal(s):     Short Term Goals: None, pt is a poor hx.     Medication Management: Evaluate patient's response, side effects, and tolerance of medication regimen.  Therapeutic Interventions: 1 to 1 sessions, Unit Group sessions and Medication administration.  Evaluation of Outcomes: Progressing   RN Treatment Plan for Primary Diagnosis: Schizophrenia ([place]) Long Term Goal(s): Knowledge of disease and therapeutic regimen to maintain health will improve  Short Term Goals: Ability to demonstrate self-control, Ability to participate in decision making will improve, Ability to verbalize feelings will improve, Ability to disclose and discuss suicidal ideas, Ability to identify and develop effective coping behaviors will improve, and Compliance with prescribed medications will improve  Medication Management: RN will administer medications as ordered by provider, will assess and evaluate patient's response and provide education to patient for prescribed medication. RN will report any adverse and/or side effects to prescribing provider.  Therapeutic Interventions: 1 on 1 counseling sessions, Psychoeducation, Medication administration, Evaluate responses to treatment, Monitor vital signs and CBGs as ordered, Perform/monitor CIWA, COWS, AIMS and Fall Risk screenings as ordered, Perform wound care treatments as ordered.  Evaluation of Outcomes: Progressing   LCSW Treatment Plan for Primary Diagnosis: Schizophrenia ([place]) Long Term Goal(s): Safe transition to appropriate next level of care at discharge, Engage  patient in therapeutic group addressing interpersonal concerns.  Short Term Goals: Engage patient in aftercare planning with referrals and resources, Increase social support, Increase ability to appropriately verbalize feelings, Increase emotional regulation, Facilitate acceptance of mental health diagnosis and concerns, and Increase skills for wellness and recovery  Therapeutic Interventions: Assess for all discharge needs, 1 to 1 time with Social worker, Explore available resources and support systems, Assess for adequacy in community support network, Educate family and significant other(s) on suicide prevention, Complete Psychosocial Assessment, Interpersonal group therapy.  Evaluation of Outcomes: Progressing   Progress in Treatment: Attending groups: No. Participating in groups: No. Taking medication as prescribed: Yes. Toleration medication: Yes. Family/Significant other contact made: Yes, individual(s) contacted:  SPE completed with the patient's father.  Patient understands diagnosis: No. Discussing patient identified problems/goals with staff: Yes. Medical problems stabilized or resolved: Yes. Denies suicidal/homicidal ideation: Yes. Issues/concerns per patient self-inventory: No. Other: none  New problem(s) identified: No,  Describe:  none Update 06/30/2021:  No changes at this time. Update 07/06/2022:  No changes at this time.    New Short Term/Long Term Goal(s): elimination of symptoms of psychosis, medication management for mood stabilization; elimination of SI thoughts; development of comprehensive mental wellness plan. Update 06/30/2021:  No changes at this time. Update 07/06/2022:  No changes at this time.    Patient Goals:  No additional treatment goals identified by patient. Update 06/30/2021:  No changes at this time. Update 07/06/2022:  No changes at this time.    Discharge Plan or Barriers: Patient remains on the unit as placement continues to be sought. Funding remains the  concern at this time and biggest barrier to placement at this time. Update 06/30/2021:  No changes at this time. Update 07/06/2022:  No changes at this time.    Reason for Continuation of Hospitalization: Anxiety Delusions  Depression Hallucinations   Estimated Length of Stay: TBD Update 07/06/2022:  TBD   Last 3 [place] Suicide Severity Risk Score: Flowsheet Row Admission (Current) from 04/16/2022 in [place] ED from 04/15/2022 in [facility] Emergency Department at [place] No Risk No Risk       Last PHQ 2/9 Scores:     No data to display          Scribe for Treatment Team: Rozann Lesches, LCSW 07/06/2022 11:41 AM

## 2022-07-06 NOTE — Plan of Care (Signed)
D: Patient alert and oriented. Patient denies pain. Patient denies anxiety and depression. Patient denies SI/HI/AVH. Patient remains isolative to room with exception to coming out for meals and medication.  A: Scheduled medications administered to patient, per MD orders.  Support and encouragement provided to patient.  Q15 minute safety checks maintained.   R: Patient compliant with medication administration and treatment plan. No adverse drug reactions noted. Patient remains safe on the unit at this time. Problem: Education: Goal: Knowledge of General Education information will improve Description: Including pain rating scale, medication(s)/side effects and non-pharmacologic comfort measures Outcome: Progressing   Problem: Health Behavior/Discharge Planning: Goal: Ability to manage health-related needs will improve Outcome: Progressing   Problem: Education: Goal: Knowledge of [place] General Education information/materials will improve Outcome: Progressing Goal: Verbalization of understanding the information provided will improve Outcome: Progressing   Problem: Health Behavior/Discharge Planning: Goal: Compliance with treatment plan for underlying cause of condition will improve Outcome: Progressing

## 2022-07-06 NOTE — Group Note (Signed)
AA/NA Group     Group Date: 07/06/2022 Start Time: [id number] End Time: 1445     Type of Therapy and Topic:  Group Therapy:    Participation Level:  Did Not Attend   Description of Group: AA/NA providers held group with patients to address SUD.   Summary of Patient Progress:     X   Therapeutic Modalities:    Maryjane Hurter 07/06/2022  2:53 PM

## 2022-07-06 NOTE — Progress Notes (Signed)
Patient was cooperative with treatment, he wasmedication compliant, he had no new behavioral issues to report on shift at this time. He seemed to sleep well through out the night.

## 2022-07-06 NOTE — Group Note (Signed)
Nina LCSW Group Therapy Note   Group Date: 07/06/2022 Start Time: [id number] End Time: 1411   Type of Therapy/Topic:  Group Therapy:  Emotion Regulation  Participation Level:  Did Not Attend   Mood:  Description of Group:    The purpose of this group is to assist patients in learning to regulate negative emotions and experience positive emotions. Patients will be guided to discuss ways in which they have been vulnerable to their negative emotions. These vulnerabilities will be juxtaposed with experiences of positive emotions or situations, and patients challenged to use positive emotions to combat negative ones. Special emphasis will be placed on coping with negative emotions in conflict situations, and patients will process healthy conflict resolution skills.  Therapeutic Goals: Patient will identify two positive emotions or experiences to reflect on in order to balance out negative emotions:  Patient will label two or more emotions that they find the most difficult to experience:  Patient will be able to demonstrate positive conflict resolution skills through discussion or role plays:   Summary of Patient Progress:   X    Therapeutic Modalities:   Cognitive Behavioral Therapy Feelings Identification Dialectical Behavioral Therapy   Rozann Lesches, LCSW

## 2022-07-06 NOTE — Progress Notes (Signed)
[facility] MD Progress Note  07/06/2022 1:10 PM Travis Sandoval  MRN:  [id number] Subjective: Follow-up 42 year old man with schizophrenia.  Continues to stay very isolated to his room.  No complaints however.  Same interaction pretty much every day when he will deny any complaints deny mood symptoms denying any dangerousness.  Patient is working with treatment team on discharge planning Principal Problem: Schizophrenia ([place]) Diagnosis: Principal Problem:   Schizophrenia ([place]) Active Problems:   Psychosis ([place])  Total Time spent with patient: 30 minutes  Past Psychiatric History: Past history of schizophrenia  Past Medical History:  Past Medical History:  Diagnosis Date   ADHD    Bipolar 1 disorder ([place])    Hepatitis C    Schizoaffective disorder ([place])    History reviewed. No pertinent surgical history. Family History:  Family History  Family history unknown: Yes   Family Psychiatric  History: See previous Social History:  Social History   Substance and Sexual Activity  Alcohol Use Not Currently     Social History   Substance and Sexual Activity  Drug Use Not Currently    Social History   Socioeconomic History   Marital status: Unknown    Spouse name: Not on file   Number of children: Not on file   Years of education: Not on file   Highest education level: Not on file  Occupational History   Not on file  Tobacco Use   Smoking status: Every Day    Packs/day: 0.25    Years: 15.00    Additional pack years: 0.00    Total pack years: 3.75    Types: Cigarettes   Smokeless tobacco: Not on file  Vaping Use   Vaping Use: Not on file  Substance and Sexual Activity   Alcohol use: Not Currently   Drug use: Not Currently   Sexual activity: Not Currently  Other Topics Concern   Not on file  Social History Narrative   Not on file   Social Determinants of Health   Financial Resource Strain: Not on file  Food Insecurity: Patient Declined (05/18/2022)   Hunger Vital Sign     Worried About Running Out of Food in the Last Year: Patient declined    [place] in the Last Year: Patient declined  Transportation Needs: Patient Declined (05/18/2022)   [place] - Hydrologist (Medical): Patient declined    Lack of Transportation (Non-Medical): Patient declined  Physical Activity: Not on file  Stress: Not on file  Social Connections: Not on file   Additional Social History:                         Sleep: Fair  Appetite:  Fair  Current Medications: Current Facility-Administered Medications  Medication Dose Route Frequency Provider Last Rate Last Admin   acetaminophen (TYLENOL) tablet 650 mg  650 mg Oral Q6H PRN [name], NP       alum & mag hydroxide-simeth (MAALOX/MYLANTA) 200-200-20 MG/5ML suspension 30 mL  30 mL Oral Q4H PRN [name], NP   30 mL at 06/28/22 2107   ARIPiprazole ER (ABILIFY MAINTENA) injection 400 mg  400 mg Intramuscular Q28 days Leotha Westermeyer T, MD   400 mg at 06/27/22 1701   cloZAPine (CLOZARIL) tablet 350 mg  350 mg Oral QHS Dallis Czaja T, MD   350 mg at 07/05/22 2135   docusate sodium (COLACE) capsule 200 mg  200 mg Oral BID  Irina Okelly, [name], MD   200 mg at 07/06/22 0801   famotidine (PEPCID) tablet 20 mg  20 mg Oral BID Kirklin Mcduffee, [name], MD   20 mg at 07/06/22 0801   hydrOXYzine (ATARAX) tablet 50 mg  50 mg Oral Q6H PRN Nayelli Inglis, [name], MD   50 mg at 05/17/22 0944   magnesium citrate solution 1 Bottle  1 Bottle Oral Daily PRN Wynelle Cleveland, RPH   1 Bottle at 06/25/22 1511   magnesium hydroxide (MILK OF MAGNESIA) suspension 30 mL  30 mL Oral Daily PRN Wynelle Cleveland, RPH   30 mL at 06/24/22 2116   nicotine polacrilex (NICORETTE) gum 2 mg  2 mg Oral PRN Danille Oppedisano, [name], MD   2 mg at 04/23/22 2200   senna-docusate (Senokot-S) tablet 2 tablet  2 tablet Oral Daily PRN Simren Popson, [name], MD   2 tablet at 06/22/22 2141   ziprasidone (GEODON) injection 20 mg  20 mg Intramuscular  Q12H PRN [name], MD        Lab Results: No results found for this or any previous visit (from the past 41 hour(s)).  Blood Alcohol level:  Lab Results  Component Value Date   ETH <10 0000000    Metabolic Disorder Labs: Lab Results  Component Value Date   HGBA1C 5.5 04/24/2022   MPG 111 04/24/2022   No results found for: "PROLACTIN" Lab Results  Component Value Date   CHOL 220 (H) 04/24/2022   TRIG 700 (H) 04/24/2022   HDL 35 (L) 04/24/2022   CHOLHDL 6.3 04/24/2022   VLDL UNABLE TO CALCULATE IF TRIGLYCERIDE OVER 400 mg/dL 04/24/2022   LDLCALC UNABLE TO CALCULATE IF TRIGLYCERIDE OVER 400 mg/dL 04/24/2022    Physical Findings: AIMS: Facial and Oral Movements Muscles of Facial Expression: None, normal Lips and Perioral Area: None, normal Jaw: None, normal Tongue: None, normal,Extremity Movements Upper (arms, wrists, hands, fingers): None, normal Lower (legs, knees, ankles, toes): None, normal, Trunk Movements Neck, shoulders, hips: None, normal, Overall Severity Severity of abnormal movements (highest score from questions above): None, normal Incapacitation due to abnormal movements: None, normal Patient's awareness of abnormal movements (rate only patient's report): No Awareness, Dental Status Current problems with teeth and/or dentures?: No Does patient usually wear dentures?: No  CIWA:    COWS:     Musculoskeletal: Strength & Muscle Tone: within normal limits Gait & Station: normal Patient leans: N/A  Psychiatric Specialty Exam:  Presentation  General Appearance:  Disheveled  Eye Contact: Minimal  Speech: Pressured  Speech Volume: Normal  Handedness: Right   Mood and Affect  Mood: Euphoric; Irritable  Affect: Inappropriate   Thought Process  Thought Processes: Disorganized  Descriptions of Associations:Loose  Orientation:Partial  Thought Content:Illogical; Scattered  History of Schizophrenia/Schizoaffective disorder:No  data recorded Duration of Psychotic Symptoms:No data recorded Hallucinations:No data recorded Ideas of Reference:Delusions; Paranoia  Suicidal Thoughts:No data recorded Homicidal Thoughts:No data recorded  Sensorium  Memory: Immediate Poor; Recent Poor; Remote Poor  Judgment: Poor  Insight: Poor   Executive Functions  Concentration: Fair  Attention Span: Fair  Recall: Poor  Fund of Knowledge: Poor  Language: Poor   Psychomotor Activity  Psychomotor Activity:No data recorded  Assets  Assets: Desire for Improvement; Social Support   Sleep  Sleep:No data recorded   Physical Exam: Physical Exam Vitals and nursing note reviewed.  Constitutional:      Appearance: Normal appearance.  HENT:     Head: Normocephalic and atraumatic.     Mouth/Throat:  Pharynx: Oropharynx is clear.  Eyes:     Pupils: Pupils are equal, round, and reactive to light.  Cardiovascular:     Rate and Rhythm: Normal rate and regular rhythm.  Pulmonary:     Effort: Pulmonary effort is normal.     Breath sounds: Normal breath sounds.  Abdominal:     General: Abdomen is flat.     Palpations: Abdomen is soft.  Musculoskeletal:        General: Normal range of motion.  Skin:    General: Skin is warm and dry.  Neurological:     General: No focal deficit present.     Mental Status: He is alert. Mental status is at baseline.  Psychiatric:        Attention and Perception: Attention normal.        Mood and Affect: Mood normal. Affect is blunt.        Speech: Speech normal.        Behavior: Behavior is withdrawn. Behavior is cooperative.        Thought Content: Thought content normal.    Review of Systems  Constitutional: Negative.   HENT: Negative.    Eyes: Negative.   Respiratory: Negative.    Cardiovascular: Negative.   Gastrointestinal: Negative.   Musculoskeletal: Negative.   Skin: Negative.   Neurological: Negative.   Psychiatric/Behavioral: Negative.     Blood  pressure (!) 141/88, pulse (!) 126, temperature 97.9 F (36.6 C), temperature source Oral, resp. rate 16, height 5\' 9"  (1.753 m), weight 96.4 kg, SpO2 99 %. Body mass index is 31.38 kg/m.   Treatment Plan Summary: Medication management and Plan no change to medication management.  We did increase the clozapine little bit.  Unclear if there is been any improvement.  Patient mostly stays withdrawn and is in need of placement which is the primary thing we are working on  Alethia Berthold, MD 07/06/2022, 1:10 PM

## 2022-07-07 DIAGNOSIS — F203 Undifferentiated schizophrenia: Secondary | ICD-10-CM | POA: Diagnosis not present

## 2022-07-07 NOTE — Plan of Care (Signed)
  Problem: Education: Goal: Knowledge of General Education information will improve Description: Including pain rating scale, medication(s)/side effects and non-pharmacologic comfort measures Outcome: Progressing   Problem: Health Behavior/Discharge Planning: Goal: Ability to manage health-related needs will improve Outcome: Progressing   Problem: Clinical Measurements: Goal: Ability to maintain clinical measurements within normal limits will improve Outcome: Progressing Goal: Will remain free from infection Outcome: Progressing Goal: Diagnostic test results will improve Outcome: Progressing Goal: Respiratory complications will improve Outcome: Progressing Goal: Cardiovascular complication will be avoided Outcome: Progressing   Problem: Activity: Goal: Risk for activity intolerance will decrease Outcome: Progressing   Problem: Nutrition: Goal: Adequate nutrition will be maintained Outcome: Progressing   Problem: Coping: Goal: Level of anxiety will decrease Outcome: Progressing   Problem: Elimination: Goal: Will not experience complications related to urinary retention Outcome: Progressing   Problem: Pain Managment: Goal: General experience of comfort will improve Outcome: Progressing   Problem: Safety: Goal: Ability to remain free from injury will improve Outcome: Progressing   Problem: Skin Integrity: Goal: Risk for impaired skin integrity will decrease Outcome: Progressing   Problem: Education: Goal: Knowledge of [place] General Education information/materials will improve Outcome: Progressing Goal: Emotional status will improve Outcome: Progressing Goal: Mental status will improve Outcome: Progressing Goal: Verbalization of understanding the information provided will improve Outcome: Progressing   Problem: Activity: Goal: Interest or engagement in activities will improve Outcome: Progressing Goal: Sleeping patterns will improve Outcome:  Progressing   Problem: Coping: Goal: Ability to verbalize frustrations and anger appropriately will improve Outcome: Progressing Goal: Ability to demonstrate self-control will improve Outcome: Progressing   Problem: Health Behavior/Discharge Planning: Goal: Identification of resources available to assist in meeting health care needs will improve Outcome: Progressing Goal: Compliance with treatment plan for underlying cause of condition will improve Outcome: Progressing   Problem: Physical Regulation: Goal: Ability to maintain clinical measurements within normal limits will improve Outcome: Progressing   Problem: Safety: Goal: Periods of time without injury will increase Outcome: Progressing   Problem: Activity: Goal: Will verbalize the importance of balancing activity with adequate rest periods Outcome: Progressing   Problem: Education: Goal: Will be free of psychotic symptoms Outcome: Progressing Goal: Knowledge of the prescribed therapeutic regimen will improve Outcome: Progressing   Problem: Coping: Goal: Coping ability will improve Outcome: Progressing Goal: Will verbalize feelings Outcome: Progressing   Problem: Health Behavior/Discharge Planning: Goal: Compliance with prescribed medication regimen will improve Outcome: Progressing   Problem: Nutritional: Goal: Ability to achieve adequate nutritional intake will improve Outcome: Progressing   Problem: Role Relationship: Goal: Ability to communicate needs accurately will improve Outcome: Progressing Goal: Ability to interact with others will improve Outcome: Progressing   Problem: Safety: Goal: Ability to redirect hostility and anger into socially appropriate behaviors will improve Outcome: Progressing Goal: Ability to remain free from injury will improve Outcome: Progressing   Problem: Self-Care: Goal: Ability to participate in self-care as condition permits will improve Outcome: Progressing    Problem: Self-Concept: Goal: Will verbalize positive feelings about self Outcome: Progressing   

## 2022-07-07 NOTE — Progress Notes (Signed)
[facility] MD Progress Note  07/07/2022 1:34 PM Travis Sandoval  MRN:  [id number] Subjective: Follow-up patient with schizophrenia.  No new complaints.  No change to behavior. Principal Problem: Schizophrenia ([place]) Diagnosis: Principal Problem:   Schizophrenia ([place]) Active Problems:   Psychosis ([place])  Total Time spent with patient: 30 minutes  Past Psychiatric History: Past history of schizophrenia  Past Medical History:  Past Medical History:  Diagnosis Date   ADHD    Bipolar 1 disorder ([place])    Hepatitis C    Schizoaffective disorder ([place])    History reviewed. No pertinent surgical history. Family History:  Family History  Family history unknown: Yes   Family Psychiatric  History: See previous Social History:  Social History   Substance and Sexual Activity  Alcohol Use Not Currently     Social History   Substance and Sexual Activity  Drug Use Not Currently    Social History   Socioeconomic History   Marital status: Unknown    Spouse name: Not on file   Number of children: Not on file   Years of education: Not on file   Highest education level: Not on file  Occupational History   Not on file  Tobacco Use   Smoking status: Every Day    Packs/day: 0.25    Years: 15.00    Additional pack years: 0.00    Total pack years: 3.75    Types: Cigarettes   Smokeless tobacco: Not on file  Vaping Use   Vaping Use: Not on file  Substance and Sexual Activity   Alcohol use: Not Currently   Drug use: Not Currently   Sexual activity: Not Currently  Other Topics Concern   Not on file  Social History Narrative   Not on file   Social Determinants of Health   Financial Resource Strain: Not on file  Food Insecurity: Patient Declined (05/18/2022)   Hunger Vital Sign    Worried About Running Out of Food in the Last Year: Patient declined    [place] in the Last Year: Patient declined  Transportation Needs: Patient Declined (05/18/2022)   [place] - Armed forces logistics/support/administrative officer (Medical): Patient declined    Lack of Transportation (Non-Medical): Patient declined  Physical Activity: Not on file  Stress: Not on file  Social Connections: Not on file   Additional Social History:                         Sleep: Fair  Appetite:  Fair  Current Medications: Current Facility-Administered Medications  Medication Dose Route Frequency Provider Last Rate Last Admin   acetaminophen (TYLENOL) tablet 650 mg  650 mg Oral Q6H PRN [name], NP       alum & mag hydroxide-simeth (MAALOX/MYLANTA) 200-200-20 MG/5ML suspension 30 mL  30 mL Oral Q4H PRN [name], NP   30 mL at 06/28/22 2107   ARIPiprazole ER (ABILIFY MAINTENA) injection 400 mg  400 mg Intramuscular Q28 days Shrey Boike T, MD   400 mg at 06/27/22 1701   cloZAPine (CLOZARIL) tablet 350 mg  350 mg Oral QHS Dondre Catalfamo T, MD   350 mg at 07/06/22 2122   docusate sodium (COLACE) capsule 200 mg  200 mg Oral BID Yazmyne Sara T, MD   200 mg at 07/07/22 0950   famotidine (PEPCID) tablet 20 mg  20 mg Oral BID Sofiah Lyne, [name], MD   20 mg at 07/07/22 [phone]  hydrOXYzine (ATARAX) tablet 50 mg  50 mg Oral Q6H PRN Feiga Nadel, [name], MD   50 mg at 05/17/22 0944   magnesium citrate solution 1 Bottle  1 Bottle Oral Daily PRN Wynelle Cleveland, RPH   1 Bottle at 06/25/22 1511   magnesium hydroxide (MILK OF MAGNESIA) suspension 30 mL  30 mL Oral Daily PRN Wynelle Cleveland, RPH   30 mL at 06/24/22 2116   nicotine polacrilex (NICORETTE) gum 2 mg  2 mg Oral PRN Dashauna Heymann, [name], MD   2 mg at 04/23/22 2200   senna-docusate (Senokot-S) tablet 2 tablet  2 tablet Oral Daily PRN Zakyah Yanes, [name], MD   2 tablet at 06/22/22 2141   ziprasidone (GEODON) injection 20 mg  20 mg Intramuscular Q12H PRN [name], MD        Lab Results: No results found for this or any previous visit (from the past 48 hour(s)).  Blood Alcohol level:  Lab Results  Component Value Date   ETH <10 0000000     Metabolic Disorder Labs: Lab Results  Component Value Date   HGBA1C 5.5 04/24/2022   MPG 111 04/24/2022   No results found for: "PROLACTIN" Lab Results  Component Value Date   CHOL 220 (H) 04/24/2022   TRIG 700 (H) 04/24/2022   HDL 35 (L) 04/24/2022   CHOLHDL 6.3 04/24/2022   VLDL UNABLE TO CALCULATE IF TRIGLYCERIDE OVER 400 mg/dL 04/24/2022   LDLCALC UNABLE TO CALCULATE IF TRIGLYCERIDE OVER 400 mg/dL 04/24/2022    Physical Findings: AIMS: Facial and Oral Movements Muscles of Facial Expression: None, normal Lips and Perioral Area: None, normal Jaw: None, normal Tongue: None, normal,Extremity Movements Upper (arms, wrists, hands, fingers): None, normal Lower (legs, knees, ankles, toes): None, normal, Trunk Movements Neck, shoulders, hips: None, normal, Overall Severity Severity of abnormal movements (highest score from questions above): None, normal Incapacitation due to abnormal movements: None, normal Patient's awareness of abnormal movements (rate only patient's report): No Awareness, Dental Status Current problems with teeth and/or dentures?: No Does patient usually wear dentures?: No  CIWA:    COWS:     Musculoskeletal: Strength & Muscle Tone: within normal limits Gait & Station: normal Patient leans: N/A  Psychiatric Specialty Exam:  Presentation  General Appearance:  Disheveled  Eye Contact: Minimal  Speech: Pressured  Speech Volume: Normal  Handedness: Right   Mood and Affect  Mood: Euphoric; Irritable  Affect: Inappropriate   Thought Process  Thought Processes: Disorganized  Descriptions of Associations:Loose  Orientation:Partial  Thought Content:Illogical; Scattered  History of Schizophrenia/Schizoaffective disorder:No data recorded Duration of Psychotic Symptoms:No data recorded Hallucinations:No data recorded Ideas of Reference:Delusions; Paranoia  Suicidal Thoughts:No data recorded Homicidal Thoughts:No data  recorded  Sensorium  Memory: Immediate Poor; Recent Poor; Remote Poor  Judgment: Poor  Insight: Poor   Executive Functions  Concentration: Fair  Attention Span: Fair  Recall: Poor  Fund of Knowledge: Poor  Language: Poor   Psychomotor Activity  Psychomotor Activity:No data recorded  Assets  Assets: Desire for Improvement; Social Support   Sleep  Sleep:No data recorded   Physical Exam: Physical Exam Vitals and nursing note reviewed.  Constitutional:      Appearance: Normal appearance.  HENT:     Head: Normocephalic and atraumatic.     Mouth/Throat:     Pharynx: Oropharynx is clear.  Eyes:     Pupils: Pupils are equal, round, and reactive to light.  Cardiovascular:     Rate and Rhythm: Normal rate and regular  rhythm.  Pulmonary:     Effort: Pulmonary effort is normal.     Breath sounds: Normal breath sounds.  Abdominal:     General: Abdomen is flat.     Palpations: Abdomen is soft.  Musculoskeletal:        General: Normal range of motion.  Skin:    General: Skin is warm and dry.  Neurological:     General: No focal deficit present.     Mental Status: He is alert. Mental status is at baseline.  Psychiatric:        Attention and Perception: Attention normal.        Mood and Affect: Mood normal. Affect is blunt.        Speech: Speech is delayed.        Behavior: Behavior is slowed.        Thought Content: Thought content normal.    Review of Systems  Constitutional: Negative.   HENT: Negative.    Eyes: Negative.   Respiratory: Negative.    Cardiovascular: Negative.   Gastrointestinal: Negative.   Musculoskeletal: Negative.   Skin: Negative.   Neurological: Negative.   Psychiatric/Behavioral: Negative.     Blood pressure 136/76, pulse (!) 107, temperature 98.3 F (36.8 C), temperature source Oral, resp. rate 18, height 5\' 9"  (1.753 m), weight 96.4 kg, SpO2 98 %. Body mass index is 31.38 kg/m.   Treatment Plan Summary: Plan  stable.  No changes to behavior.  No new complaints.  Still focused on discharge planning  Alethia Berthold, MD 07/07/2022, 1:34 PM

## 2022-07-07 NOTE — [facility] Group Notes (Signed)
[place] Group Notes:  (Nursing/MHT/Case Management/Adjunct)  Date:  07/07/2022  Time:  9:18 PM  Type of Therapy:   Wrap up  Participation Level:  Active  Participation Quality:  Appropriate  Affect:  Appropriate  Cognitive:  Alert  Insight:  Good  Engagement in Group:  Engaged and goal was to go outside and he did.  Modes of Intervention:  Support  Summary of Progress/Problems:  Nehemiah Settle 07/07/2022, 9:18 PM

## 2022-07-07 NOTE — Progress Notes (Signed)
Patient had a good evening compliant with medications no adverse effects noted. Stated he is patiently waiting for his money to come so he can get a place to stay. Denies SI/HI/A/VH and verbally contracted for safety.   Q 15 minutes safety checks ongoing and Patient remains safe. Support and encouragement provided.

## 2022-07-07 NOTE — Progress Notes (Signed)
D- Patient alert and oriented x2-3. Affect flat/mood preoccupied. Denies SI/ HI/ AVH. He denies.pain. 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 A- Scheduled medications administered to patient, per MD orders. Support and encouragement provided.  Routine safety checks conducted every 15 minutes.  Patient informed to notify staff with problems or concerns and verbalizes understanding. R- No adverse drug reactions noted. He participated in outdoor activity this shift. He is compliant with medications and treatment plan. Patient receptive, calm, and cooperative. He is isolative but comes out for meals and med's at times. Patient contracts for safety and remains safe on the unit at this time.

## 2022-07-08 DIAGNOSIS — F203 Undifferentiated schizophrenia: Secondary | ICD-10-CM | POA: Diagnosis not present

## 2022-07-08 NOTE — Progress Notes (Signed)
[facility] MD Progress Note  07/08/2022 5:41 PM Travis Sandoval  MRN:  [id number] Subjective: Patient seen and chart reviewed.  Patient was schizophrenia.  No new complaints.  He asked me if I had been getting the messages he had been sending me.  I had no idea what he was talking about but it sounds like he may have been writing them on his morning report and nobody understood that it was for me.  He did not have anything else to tell me.  No other behavior problems.  I will try and follow up with him. Principal Problem: Schizophrenia ([place]) Diagnosis: Principal Problem:   Schizophrenia ([place]) Active Problems:   Psychosis ([place])  Total Time spent with patient: 20 minutes  Past Psychiatric History: Past history of schizophrenia  Past Medical History:  Past Medical History:  Diagnosis Date   ADHD    Bipolar 1 disorder ([place])    Hepatitis C    Schizoaffective disorder ([place])    History reviewed. No pertinent surgical history. Family History:  Family History  Family history unknown: Yes   Family Psychiatric  History: See previous Social History:  Social History   Substance and Sexual Activity  Alcohol Use Not Currently     Social History   Substance and Sexual Activity  Drug Use Not Currently    Social History   Socioeconomic History   Marital status: Unknown    Spouse name: Not on file   Number of children: Not on file   Years of education: Not on file   Highest education level: Not on file  Occupational History   Not on file  Tobacco Use   Smoking status: Every Day    Packs/day: 0.25    Years: 15.00    Additional pack years: 0.00    Total pack years: 3.75    Types: Cigarettes   Smokeless tobacco: Not on file  Vaping Use   Vaping Use: Not on file  Substance and Sexual Activity   Alcohol use: Not Currently   Drug use: Not Currently   Sexual activity: Not Currently  Other Topics Concern   Not on file  Social History Narrative   Not on file   Social Determinants of  Health   Financial Resource Strain: Not on file  Food Insecurity: Patient Declined (05/18/2022)   Hunger Vital Sign    Worried About Running Out of Food in the Last Year: Patient declined    [place] in the Last Year: Patient declined  Transportation Needs: Patient Declined (05/18/2022)   [place] - Hydrologist (Medical): Patient declined    Lack of Transportation (Non-Medical): Patient declined  Physical Activity: Not on file  Stress: Not on file  Social Connections: Not on file   Additional Social History:                         Sleep: Fair  Appetite:  Fair  Current Medications: Current Facility-Administered Medications  Medication Dose Route Frequency Provider Last Rate Last Admin   acetaminophen (TYLENOL) tablet 650 mg  650 mg Oral Q6H PRN [name], NP       alum & mag hydroxide-simeth (MAALOX/MYLANTA) 200-200-20 MG/5ML suspension 30 mL  30 mL Oral Q4H PRN [name], NP   30 mL at 06/28/22 2107   ARIPiprazole ER (ABILIFY MAINTENA) injection 400 mg  400 mg Intramuscular Q28 days Damont Balles, [name], MD   400 mg at  06/27/22 1701   cloZAPine (CLOZARIL) tablet 350 mg  350 mg Oral QHS Rayana Geurin T, MD   350 mg at 07/07/22 2120   docusate sodium (COLACE) capsule 200 mg  200 mg Oral BID Kamrin Sibley T, MD   200 mg at 07/08/22 0825   famotidine (PEPCID) tablet 20 mg  20 mg Oral BID Fabrizio Filip T, MD   20 mg at 07/08/22 0825   hydrOXYzine (ATARAX) tablet 50 mg  50 mg Oral Q6H PRN Tiandra Swoveland, [name], MD   50 mg at 05/17/22 0944   magnesium citrate solution 1 Bottle  1 Bottle Oral Daily PRN Wynelle Cleveland, RPH   1 Bottle at 06/25/22 1511   magnesium hydroxide (MILK OF MAGNESIA) suspension 30 mL  30 mL Oral Daily PRN Wynelle Cleveland, RPH   30 mL at 07/08/22 0825   nicotine polacrilex (NICORETTE) gum 2 mg  2 mg Oral PRN Dyllen Menning, [name], MD   2 mg at 04/23/22 2200   senna-docusate (Senokot-S) tablet 2 tablet  2 tablet Oral  Daily PRN Kennesha Brewbaker, [name], MD   2 tablet at 06/22/22 2141   ziprasidone (GEODON) injection 20 mg  20 mg Intramuscular Q12H PRN [name], MD        Lab Results: No results found for this or any previous visit (from the past 40 hour(s)).  Blood Alcohol level:  Lab Results  Component Value Date   ETH <10 0000000    Metabolic Disorder Labs: Lab Results  Component Value Date   HGBA1C 5.5 04/24/2022   MPG 111 04/24/2022   No results found for: "PROLACTIN" Lab Results  Component Value Date   CHOL 220 (H) 04/24/2022   TRIG 700 (H) 04/24/2022   HDL 35 (L) 04/24/2022   CHOLHDL 6.3 04/24/2022   VLDL UNABLE TO CALCULATE IF TRIGLYCERIDE OVER 400 mg/dL 04/24/2022   LDLCALC UNABLE TO CALCULATE IF TRIGLYCERIDE OVER 400 mg/dL 04/24/2022    Physical Findings: AIMS: Facial and Oral Movements Muscles of Facial Expression: None, normal Lips and Perioral Area: None, normal Jaw: None, normal Tongue: None, normal,Extremity Movements Upper (arms, wrists, hands, fingers): None, normal Lower (legs, knees, ankles, toes): None, normal, Trunk Movements Neck, shoulders, hips: None, normal, Overall Severity Severity of abnormal movements (highest score from questions above): None, normal Incapacitation due to abnormal movements: None, normal Patient's awareness of abnormal movements (rate only patient's report): No Awareness, Dental Status Current problems with teeth and/or dentures?: No Does patient usually wear dentures?: No  CIWA:    COWS:     Musculoskeletal: Strength & Muscle Tone: within normal limits Gait & Station: normal Patient leans: N/A  Psychiatric Specialty Exam:  Presentation  General Appearance:  Disheveled  Eye Contact: Minimal  Speech: Pressured  Speech Volume: Normal  Handedness: Right   Mood and Affect  Mood: Euphoric; Irritable  Affect: Inappropriate   Thought Process  Thought Processes: Disorganized  Descriptions of  Associations:Loose  Orientation:Partial  Thought Content:Illogical; Scattered  History of Schizophrenia/Schizoaffective disorder:No data recorded Duration of Psychotic Symptoms:No data recorded Hallucinations:No data recorded Ideas of Reference:Delusions; Paranoia  Suicidal Thoughts:No data recorded Homicidal Thoughts:No data recorded  Sensorium  Memory: Immediate Poor; Recent Poor; Remote Poor  Judgment: Poor  Insight: Poor   Executive Functions  Concentration: Fair  Attention Span: Fair  Recall: Poor  Fund of Knowledge: Poor  Language: Poor   Psychomotor Activity  Psychomotor Activity:No data recorded  Assets  Assets: Desire for Improvement; Social Support   Sleep  Sleep:No  data recorded   Physical Exam: Physical Exam Vitals and nursing note reviewed.  Constitutional:      Appearance: Normal appearance.  HENT:     Head: Normocephalic and atraumatic.     Mouth/Throat:     Pharynx: Oropharynx is clear.  Eyes:     Pupils: Pupils are equal, round, and reactive to light.  Cardiovascular:     Rate and Rhythm: Normal rate and regular rhythm.  Pulmonary:     Effort: Pulmonary effort is normal.     Breath sounds: Normal breath sounds.  Abdominal:     General: Abdomen is flat.     Palpations: Abdomen is soft.  Musculoskeletal:        General: Normal range of motion.  Skin:    General: Skin is warm and dry.  Neurological:     General: No focal deficit present.     Mental Status: He is alert. Mental status is at baseline.  Psychiatric:        Attention and Perception: Attention normal.        Mood and Affect: Mood normal. Affect is blunt.        Speech: Speech normal.        Behavior: Behavior is cooperative.        Thought Content: Thought content is delusional.    Review of Systems  Constitutional: Negative.   HENT: Negative.    Eyes: Negative.   Respiratory: Negative.    Cardiovascular: Negative.   Gastrointestinal: Negative.    Musculoskeletal: Negative.   Skin: Negative.   Neurological: Negative.   Psychiatric/Behavioral: Negative.     Blood pressure 115/85, pulse (!) 102, temperature 97.8 F (36.6 C), temperature source Oral, resp. rate 18, height 5\' 9"  (1.753 m), weight 96.4 kg, SpO2 97 %. Body mass index is 31.38 kg/m.   Treatment Plan Summary: Medication management and Plan no change to medication.  I will see if I can follow up on the messages he is trying to send me.  In general we are still looking at discharge [place], MD 07/08/2022, 5:41 PM

## 2022-07-08 NOTE — Group Note (Signed)
Recreation Therapy Group Note   Group Topic:Goal Setting  Group Date: 07/08/2022 Start Time: 1000 End Time: 1045 Facilitators: Vilma Prader, LRT, CTRS Location:  Craft Room  Group Description: Scientist, physiological. Patients were given many different magazines, a glue stick, markers, and a piece of cardstock paper. LRT and pts discussed the importance of having goals in life. LRT and pts discussed the difference between short-term and long-term goals. LRT encouraged pts to create a vision board, with images they picked and then cut out by LRT from the magazine, for themselves, that capture their short and long-term goals. On the back of the paper, pt encouraged to write 3 different coping skills that can help them reach those goals. LRT encouraged pts to show and explain their vision board to the group once complete. LRT offered to laminate vision board once dry and complete.   Affect/Mood: N/A   Participation Level: Did not attend    Clinical Observations/Individualized Feedback: Travis Sandoval did not attend group due to resting in his room.  Plan: Continue to engage patient in RT group sessions 2-3x/week.   Vilma Prader, LRT, CTRS 07/08/2022 11:07 AM

## 2022-07-08 NOTE — Progress Notes (Signed)
Patient refused his scheduled evening medication. Patient states that he doesn't feel like the Colace is working and he does not have anymore indigestion issues, which is why he's refusing Protonix.

## 2022-07-08 NOTE — Plan of Care (Signed)
D- Patient alert and oriented. Patient presented in a pleasant mood on assessment stating that he slept good last night and had no complaints to voice to this Probation officer. Patient denied SI, HI, AVH, and pain at this time. Patient also denied signs/symptoms of depression and anxiety, stating that he is feeling "good" overall. Patient had no stated goals for today.  A- Scheduled medications administered to patient, per MD orders. Support and encouragement provided.  Routine safety checks conducted every 15 minutes.  Patient informed to notify staff with problems or concerns.  R- No adverse drug reactions noted. Patient contracts for safety at this time. Patient compliant with medications. Patient receptive, calm, and cooperative. Although patient isolates to room, except for meals and medication, he has recently been going outside to the courtyard, with staff and other members on the unit. Patient remains safe at this time.  Problem: Education: Goal: Knowledge of General Education information will improve Description: Including pain rating scale, medication(s)/side effects and non-pharmacologic comfort measures Outcome: Progressing   Problem: Health Behavior/Discharge Planning: Goal: Ability to manage health-related needs will improve Outcome: Progressing   Problem: Clinical Measurements: Goal: Ability to maintain clinical measurements within normal limits will improve Outcome: Progressing Goal: Will remain free from infection Outcome: Progressing Goal: Diagnostic test results will improve Outcome: Progressing Goal: Respiratory complications will improve Outcome: Progressing Goal: Cardiovascular complication will be avoided Outcome: Progressing   Problem: Activity: Goal: Risk for activity intolerance will decrease Outcome: Progressing   Problem: Nutrition: Goal: Adequate nutrition will be maintained Outcome: Progressing   Problem: Coping: Goal: Level of anxiety will decrease Outcome:  Progressing   Problem: Elimination: Goal: Will not experience complications related to urinary retention Outcome: Progressing   Problem: Pain Managment: Goal: General experience of comfort will improve Outcome: Progressing   Problem: Safety: Goal: Ability to remain free from injury will improve Outcome: Progressing   Problem: Skin Integrity: Goal: Risk for impaired skin integrity will decrease Outcome: Progressing   Problem: Education: Goal: Knowledge of [place] General Education information/materials will improve Outcome: Progressing Goal: Emotional status will improve Outcome: Progressing Goal: Mental status will improve Outcome: Progressing Goal: Verbalization of understanding the information provided will improve Outcome: Progressing   Problem: Activity: Goal: Interest or engagement in activities will improve Outcome: Progressing Goal: Sleeping patterns will improve Outcome: Progressing   Problem: Coping: Goal: Ability to verbalize frustrations and anger appropriately will improve Outcome: Progressing Goal: Ability to demonstrate self-control will improve Outcome: Progressing   Problem: Health Behavior/Discharge Planning: Goal: Identification of resources available to assist in meeting health care needs will improve Outcome: Progressing Goal: Compliance with treatment plan for underlying cause of condition will improve Outcome: Progressing   Problem: Physical Regulation: Goal: Ability to maintain clinical measurements within normal limits will improve Outcome: Progressing   Problem: Safety: Goal: Periods of time without injury will increase Outcome: Progressing   Problem: Activity: Goal: Will verbalize the importance of balancing activity with adequate rest periods Outcome: Progressing   Problem: Education: Goal: Will be free of psychotic symptoms Outcome: Progressing Goal: Knowledge of the prescribed therapeutic regimen will improve Outcome:  Progressing   Problem: Coping: Goal: Coping ability will improve Outcome: Progressing Goal: Will verbalize feelings Outcome: Progressing   Problem: Health Behavior/Discharge Planning: Goal: Compliance with prescribed medication regimen will improve Outcome: Progressing   Problem: Nutritional: Goal: Ability to achieve adequate nutritional intake will improve Outcome: Progressing   Problem: Role Relationship: Goal: Ability to communicate needs accurately will improve Outcome: Progressing Goal: Ability to interact  with others will improve Outcome: Progressing   Problem: Safety: Goal: Ability to redirect hostility and anger into socially appropriate behaviors will improve Outcome: Progressing Goal: Ability to remain free from injury will improve Outcome: Progressing   Problem: Self-Care: Goal: Ability to participate in self-care as condition permits will improve Outcome: Progressing   Problem: Self-Concept: Goal: Will verbalize positive feelings about self Outcome: Progressing

## 2022-07-08 NOTE — BHH Group Notes (Signed)
[place] Group Notes:  (Nursing/MHT/Case Management/Adjunct)  Date:  07/08/2022  Time:  8:56 PM  Type of Therapy:  Group Therapy  Participation Level:  Minimal  Participation Quality:  Appropriate  Affect:  Flat  Cognitive:  Alert and Appropriate  Insight:  Good and Improving  Engagement in Group:  Limited  Modes of Intervention:  Discussion and Education  Summary of Progress/Problems:  Maglione,Travis Sandoval 07/08/2022, 8:56 PM

## 2022-07-08 NOTE — Progress Notes (Addendum)
Patient has been isolative to his room.  Often observed sitting on the edge of his bed talking to himself. He is pleasant and cooperative.  No issues or concerns to address at this encounter.  He denies  hi  avh when a asked directly, but again often times seen taking to himself in his room. He is med compliant and received his meds without incident   He is safe on the unit with q15 minute safety checks.      C Butler-Nicholson, LPN

## 2022-07-08 NOTE — Plan of Care (Signed)
  Problem: Self-Concept: Goal: Will verbalize positive feelings about self Outcome: Progressing   Problem: Self-Care: Goal: Ability to participate in self-care as condition permits will improve Outcome: Progressing   Problem: Safety: Goal: Ability to redirect hostility and anger into socially appropriate behaviors will improve Outcome: Progressing Goal: Ability to remain free from injury will improve Outcome: Progressing   Problem: Role Relationship: Goal: Ability to communicate needs accurately will improve Outcome: Progressing Goal: Ability to interact with others will improve Outcome: Progressing   Problem: Nutritional: Goal: Ability to achieve adequate nutritional intake will improve Outcome: Progressing   Problem: Health Behavior/Discharge Planning: Goal: Compliance with prescribed medication regimen will improve Outcome: Progressing   Problem: Coping: Goal: Coping ability will improve Outcome: Progressing

## 2022-07-08 NOTE — Group Note (Signed)
[facility] LCSW Group Therapy Note    Group Date: 07/08/2022 Start Time: 1300 End Time: 1400  Type of Therapy and Topic:  Group Therapy:  Overcoming Obstacles  Participation Level:  BHH PARTICIPATION LEVEL: Did Not Attend  Mood:  Description of Group:   In this group patients will be encouraged to explore what they see as obstacles to their own wellness and recovery. They will be guided to discuss their thoughts, feelings, and behaviors related to these obstacles. The group will process together ways to cope with barriers, with attention given to specific choices patients can make. Each patient will be challenged to identify changes they are motivated to make in order to overcome their obstacles. This group will be process-oriented, with patients participating in exploration of their own experiences as well as giving and receiving support and challenge from other group members.  Therapeutic Goals: 1. Patient will identify personal and current obstacles as they relate to admission. 2. Patient will identify barriers that currently interfere with their wellness or overcoming obstacles.  3. Patient will identify feelings, thought process and behaviors related to these barriers. 4. Patient will identify two changes they are willing to make to overcome these obstacles:    Summary of Patient Progress Patient did not attend group despite encouraged participation.    Therapeutic Modalities:   Cognitive Behavioral Therapy Solution Focused Therapy Motivational Interviewing Relapse Prevention Therapy   Durenda Hurt, [place]

## 2022-07-08 NOTE — BHH Group Notes (Signed)
[place] Group Notes:  (Nursing/MHT/Case Management/Adjunct)  Date:  07/08/2022  Time:  10:03 AM  Type of Therapy:   Community Meeting  Participation Level:  Did Not Attend   Adela Lank [name] 07/08/2022, 10:03 AM

## 2022-07-09 DIAGNOSIS — F203 Undifferentiated schizophrenia: Secondary | ICD-10-CM | POA: Diagnosis not present

## 2022-07-09 NOTE — Plan of Care (Signed)
D- Patient alert and oriented. Patient presented in a pleasant mood on assessment reporting that he is doing ok and had no complaints to voice to this Probation officer. Patient denied SI, HI, AVH, and pain at this time. Patient also denied any signs/symptoms of depression and anxiety, stating that he "wants to see the doctor to look over his paperwork". Patient had no stated goals for today.  A- Patient refused scheduled medications administered to patient, per MD orders. Support and encouragement provided.  Routine safety checks conducted every 15 minutes.  Patient informed to notify staff with problems or concerns.  R- Patient contracts for safety at this time. Patient receptive, calm, and cooperative. Patient isolates to room, except for meals and medication. Patient remains safe at this time.  Problem: Education: Goal: Knowledge of General Education information will improve Description: Including pain rating scale, medication(s)/side effects and non-pharmacologic comfort measures Outcome: Progressing   Problem: Health Behavior/Discharge Planning: Goal: Ability to manage health-related needs will improve Outcome: Progressing   Problem: Clinical Measurements: Goal: Ability to maintain clinical measurements within normal limits will improve Outcome: Progressing Goal: Will remain free from infection Outcome: Progressing Goal: Diagnostic test results will improve Outcome: Progressing Goal: Respiratory complications will improve Outcome: Progressing Goal: Cardiovascular complication will be avoided Outcome: Progressing   Problem: Activity: Goal: Risk for activity intolerance will decrease Outcome: Progressing   Problem: Nutrition: Goal: Adequate nutrition will be maintained Outcome: Progressing   Problem: Coping: Goal: Level of anxiety will decrease Outcome: Progressing   Problem: Elimination: Goal: Will not experience complications related to urinary retention Outcome: Progressing    Problem: Pain Managment: Goal: General experience of comfort will improve Outcome: Progressing   Problem: Safety: Goal: Ability to remain free from injury will improve Outcome: Progressing   Problem: Skin Integrity: Goal: Risk for impaired skin integrity will decrease Outcome: Progressing   Problem: Education: Goal: Knowledge of [place] General Education information/materials will improve Outcome: Progressing Goal: Emotional status will improve Outcome: Progressing Goal: Mental status will improve Outcome: Progressing Goal: Verbalization of understanding the information provided will improve Outcome: Progressing   Problem: Activity: Goal: Interest or engagement in activities will improve Outcome: Progressing Goal: Sleeping patterns will improve Outcome: Progressing   Problem: Coping: Goal: Ability to verbalize frustrations and anger appropriately will improve Outcome: Progressing Goal: Ability to demonstrate self-control will improve Outcome: Progressing   Problem: Health Behavior/Discharge Planning: Goal: Identification of resources available to assist in meeting health care needs will improve Outcome: Progressing Goal: Compliance with treatment plan for underlying cause of condition will improve Outcome: Progressing   Problem: Physical Regulation: Goal: Ability to maintain clinical measurements within normal limits will improve Outcome: Progressing   Problem: Safety: Goal: Periods of time without injury will increase Outcome: Progressing   Problem: Activity: Goal: Will verbalize the importance of balancing activity with adequate rest periods Outcome: Progressing   Problem: Education: Goal: Will be free of psychotic symptoms Outcome: Progressing Goal: Knowledge of the prescribed therapeutic regimen will improve Outcome: Progressing   Problem: Coping: Goal: Coping ability will improve Outcome: Progressing Goal: Will verbalize feelings Outcome:  Progressing   Problem: Health Behavior/Discharge Planning: Goal: Compliance with prescribed medication regimen will improve Outcome: Progressing   Problem: Nutritional: Goal: Ability to achieve adequate nutritional intake will improve Outcome: Progressing   Problem: Role Relationship: Goal: Ability to communicate needs accurately will improve Outcome: Progressing Goal: Ability to interact with others will improve Outcome: Progressing   Problem: Safety: Goal: Ability to redirect hostility and anger into socially  appropriate behaviors will improve Outcome: Progressing Goal: Ability to remain free from injury will improve Outcome: Progressing   Problem: Self-Care: Goal: Ability to participate in self-care as condition permits will improve Outcome: Progressing   Problem: Self-Concept: Goal: Will verbalize positive feelings about self Outcome: Progressing

## 2022-07-09 NOTE — Group Note (Signed)
Date:  07/09/2022 Time:  5:56 PM  Group Topic/Focus:  Community Meeting    Participation Level:  Did Not Attend   Travis Sandoval [facility] 07/09/2022, 5:56 PM

## 2022-07-09 NOTE — Progress Notes (Signed)
Patient calm and pleasant during assessment denying SI/HI/AVH. Pt compliant with medication administration per MD orders. Pt given education, support, and encouragement to be active in his treatment plan. Pt being monitored Q 15 minutes for safety per unit protocol, remains safe on the unit

## 2022-07-09 NOTE — Progress Notes (Signed)
Patient refused his scheduled morning medication, stating that it is not helping. MD will be notified.

## 2022-07-09 NOTE — Progress Notes (Signed)
[facility] MD Progress Note  07/09/2022 4:50 PM Travis Sandoval  MRN:  [id number] Subjective: Patient seen and chart reviewed.  No new complaints.  Seems to still have some residual delusions but is not behaving badly Principal Problem: Schizophrenia ([place]) Diagnosis: Principal Problem:   Schizophrenia ([place]) Active Problems:   Psychosis ([place])  Total Time spent with patient: 30 minutes  Past Psychiatric History: Past history of schizophrenia  Past Medical History:  Past Medical History:  Diagnosis Date   ADHD    Bipolar 1 disorder ([place])    Hepatitis C    Schizoaffective disorder ([place])    History reviewed. No pertinent surgical history. Family History:  Family History  Family history unknown: Yes   Family Psychiatric  History: See previous Social History:  Social History   Substance and Sexual Activity  Alcohol Use Not Currently     Social History   Substance and Sexual Activity  Drug Use Not Currently    Social History   Socioeconomic History   Marital status: Unknown    Spouse name: Not on file   Number of children: Not on file   Years of education: Not on file   Highest education level: Not on file  Occupational History   Not on file  Tobacco Use   Smoking status: Every Day    Packs/day: 0.25    Years: 15.00    Additional pack years: 0.00    Total pack years: 3.75    Types: Cigarettes   Smokeless tobacco: Not on file  Vaping Use   Vaping Use: Not on file  Substance and Sexual Activity   Alcohol use: Not Currently   Drug use: Not Currently   Sexual activity: Not Currently  Other Topics Concern   Not on file  Social History Narrative   Not on file   Social Determinants of Health   Financial Resource Strain: Not on file  Food Insecurity: Patient Declined (05/18/2022)   Hunger Vital Sign    Worried About Running Out of Food in the Last Year: Patient declined    [employer] in the Last Year: Patient declined  Transportation Needs: Patient Declined  (05/18/2022)   [place] - Hydrologist (Medical): Patient declined    Lack of Transportation (Non-Medical): Patient declined  Physical Activity: Not on file  Stress: Not on file  Social Connections: Not on file   Additional Social History:                         Sleep: Fair  Appetite:  Fair  Current Medications: Current Facility-Administered Medications  Medication Dose Route Frequency Provider Last Rate Last Admin   acetaminophen (TYLENOL) tablet 650 mg  650 mg Oral Q6H PRN [name], NP       alum & mag hydroxide-simeth (MAALOX/MYLANTA) 200-200-20 MG/5ML suspension 30 mL  30 mL Oral Q4H PRN [name], NP   30 mL at 06/28/22 2107   ARIPiprazole ER (ABILIFY MAINTENA) injection 400 mg  400 mg Intramuscular Q28 days Zamariah Seaborn T, MD   400 mg at 06/27/22 1701   cloZAPine (CLOZARIL) tablet 350 mg  350 mg Oral QHS Cristle Jared T, MD   350 mg at 07/08/22 2058   docusate sodium (COLACE) capsule 200 mg  200 mg Oral BID Juma Oxley T, MD   200 mg at 07/08/22 0825   famotidine (PEPCID) tablet 20 mg  20 mg Oral BID Keeana Pieratt  T, MD   20 mg at 07/08/22 0825   hydrOXYzine (ATARAX) tablet 50 mg  50 mg Oral Q6H PRN Griffey Nicasio T, MD   50 mg at 07/08/22 2100   magnesium citrate solution 1 Bottle  1 Bottle Oral Daily PRN Wynelle Cleveland, RPH   1 Bottle at 06/25/22 1511   magnesium hydroxide (MILK OF MAGNESIA) suspension 30 mL  30 mL Oral Daily PRN Wynelle Cleveland, RPH   30 mL at 07/08/22 0825   nicotine polacrilex (NICORETTE) gum 2 mg  2 mg Oral PRN Santhosh Gulino, [name], MD   2 mg at 04/23/22 2200   senna-docusate (Senokot-S) tablet 2 tablet  2 tablet Oral Daily PRN Sally-Anne Wamble, [name], MD   2 tablet at 06/22/22 2141   ziprasidone (GEODON) injection 20 mg  20 mg Intramuscular Q12H PRN [name], MD        Lab Results: No results found for this or any previous visit (from the past 48 hour(s)).  Blood Alcohol level:  Lab Results   Component Value Date   ETH <10 0000000    Metabolic Disorder Labs: Lab Results  Component Value Date   HGBA1C 5.5 04/24/2022   MPG 111 04/24/2022   No results found for: "PROLACTIN" Lab Results  Component Value Date   CHOL 220 (H) 04/24/2022   TRIG 700 (H) 04/24/2022   HDL 35 (L) 04/24/2022   CHOLHDL 6.3 04/24/2022   VLDL UNABLE TO CALCULATE IF TRIGLYCERIDE OVER 400 mg/dL 04/24/2022   LDLCALC UNABLE TO CALCULATE IF TRIGLYCERIDE OVER 400 mg/dL 04/24/2022    Physical Findings: AIMS: Facial and Oral Movements Muscles of Facial Expression: None, normal Lips and Perioral Area: None, normal Jaw: None, normal Tongue: None, normal,Extremity Movements Upper (arms, wrists, hands, fingers): None, normal Lower (legs, knees, ankles, toes): None, normal, Trunk Movements Neck, shoulders, hips: None, normal, Overall Severity Severity of abnormal movements (highest score from questions above): None, normal Incapacitation due to abnormal movements: None, normal Patient's awareness of abnormal movements (rate only patient's report): No Awareness, Dental Status Current problems with teeth and/or dentures?: No Does patient usually wear dentures?: No  CIWA:    COWS:     Musculoskeletal: Strength & Muscle Tone: within normal limits Gait & Station: normal Patient leans: N/A  Psychiatric Specialty Exam:  Presentation  General Appearance:  Disheveled  Eye Contact: Minimal  Speech: Pressured  Speech Volume: Normal  Handedness: Right   Mood and Affect  Mood: Euphoric; Irritable  Affect: Inappropriate   Thought Process  Thought Processes: Disorganized  Descriptions of Associations:Loose  Orientation:Partial  Thought Content:Illogical; Scattered  History of Schizophrenia/Schizoaffective disorder:No data recorded Duration of Psychotic Symptoms:No data recorded Hallucinations:No data recorded Ideas of Reference:Delusions; Paranoia  Suicidal Thoughts:No data  recorded Homicidal Thoughts:No data recorded  Sensorium  Memory: Immediate Poor; Recent Poor; Remote Poor  Judgment: Poor  Insight: Poor   Executive Functions  Concentration: Fair  Attention Span: Fair  Recall: Poor  Fund of Knowledge: Poor  Language: Poor   Psychomotor Activity  Psychomotor Activity:No data recorded  Assets  Assets: Desire for Improvement; Social Support   Sleep  Sleep:No data recorded   Physical Exam: Physical Exam Vitals and nursing note reviewed.  Constitutional:      Appearance: Normal appearance.  HENT:     Head: Normocephalic and atraumatic.     Mouth/Throat:     Pharynx: Oropharynx is clear.  Eyes:     Pupils: Pupils are equal, round, and reactive to light.  Cardiovascular:  Rate and Rhythm: Normal rate and regular rhythm.  Pulmonary:     Effort: Pulmonary effort is normal.     Breath sounds: Normal breath sounds.  Abdominal:     General: Abdomen is flat.     Palpations: Abdomen is soft.  Musculoskeletal:        General: Normal range of motion.  Skin:    General: Skin is warm and dry.  Neurological:     General: No focal deficit present.     Mental Status: He is alert. Mental status is at baseline.  Psychiatric:        Attention and Perception: Attention normal.        Mood and Affect: Mood normal. Affect is blunt.        Speech: Speech is delayed.        Behavior: Behavior is slowed.        Thought Content: Thought content is delusional.    Review of Systems  Constitutional: Negative.   HENT: Negative.    Eyes: Negative.   Respiratory: Negative.    Cardiovascular: Negative.   Gastrointestinal: Negative.   Musculoskeletal: Negative.   Skin: Negative.   Neurological: Negative.   Psychiatric/Behavioral: Negative.     Blood pressure 133/87, pulse (!) 114, temperature 98.1 F (36.7 C), temperature source Oral, resp. rate 19, height 5\' 9"  (1.753 m), weight 96.4 kg, SpO2 98 %. Body mass index is 31.38  kg/m.   Treatment Plan Summary: Medication management and Plan no change to medication management.  Supportive encouragement.  He tells me that he was told by some staff that they may be working on a discharge plan that could go into affect by later this week.  Alethia Berthold, MD 07/09/2022, 4:50 PM

## 2022-07-09 NOTE — Plan of Care (Signed)
  Problem: Role Relationship: Goal: Ability to interact with others will improve Outcome: Progressing   Problem: Self-Care: Goal: Ability to participate in self-care as condition permits will improve Outcome: Progressing   Problem: Self-Concept: Goal: Will verbalize positive feelings about self Outcome: Progressing  Patient compliant with medications stated he had a good day went to groups went outside for fresh air. Patient interacting well with Staff and Peers. Staff and encouragement provided.

## 2022-07-09 NOTE — Group Note (Signed)
[facility] LCSW Group Therapy Note   Group Date: 07/09/2022 Start Time: 1300 End Time: 1400  Type of Therapy/Topic:  Group Therapy:  Feelings about Diagnosis  Participation Level:  Did Not Attend    Description of Group:    This group will allow patients to explore their thoughts and feelings about diagnoses they have received. Patients will be guided to explore their level of understanding and acceptance of these diagnoses. Facilitator will encourage patients to process their thoughts and feelings about the reactions of others to their diagnosis, and will guide patients in identifying ways to discuss their diagnosis with significant others in their lives. This group will be process-oriented, with patients participating in exploration of their own experiences as well as giving and receiving support and challenge from other group members.   Therapeutic Goals: 1. Patient will demonstrate understanding of diagnosis as evidence by identifying two or more symptoms of the disorder:  2. Patient will be able to express two feelings regarding the diagnosis 3. Patient will demonstrate ability to communicate their needs through discussion and/or role plays  Summary of Patient Progress: X   Therapeutic Modalities:   Cognitive Behavioral Therapy Brief Therapy Feelings Identification    Travis Harris, LCSW

## 2022-07-09 NOTE — Group Note (Addendum)
Date:  07/09/2022 Time:  4:32 PM  Group Topic/Focus:  Activity Group    Participation Level:  Active  Participation Quality:  Appropriate  Affect:  Appropriate  Cognitive:  Appropriate  Insight: Appropriate  Engagement in Group:  Engaged  Modes of Intervention:  Activity  Additional Comments:    Travis Sandoval 07/09/2022, 4:32 PM

## 2022-07-09 NOTE — Group Note (Signed)
Date:  07/09/2022 Time:  6:32 PM  Group Topic/Focus:  Outside time, music group, motion group   Participation Level:  Active  Participation Quality:  Appropriate  Affect:  Appropriate  Cognitive:  Alert and Appropriate  Insight: Appropriate and Good  Engagement in Group:  Engaged  Modes of Intervention:  Activity  Additional Comments:  Enjoyed the time spent outside   [employer] 07/09/2022, 6:32 PM

## 2022-07-09 NOTE — Group Note (Signed)
Recreation Therapy Group Note   Group Topic:Healthy Support Systems  Group Date: 07/09/2022 Start Time: 1000 End Time: 1055 Facilitators: Vilma Prader, LRT, CTRS Location:  Craft Room  Group Description: Straw Bridge. Patients were given 10 plastic drinking straws and an equal length of masking tape. Using the materials provided, patients were instructed to build a free-standing bridge-like structure to suspend an everyday item (ex: deck of cards) off the floor or table surface. All materials were required to be used in Conservation officer, nature. LRT facilitated post-activity discussion reviewing the importance of having strong and healthy support systems in our lives. LRT discussed how the people in our lives serve as the tape and the book we placed on top of our straw structure are the stressors we face in daily life. LRT and pts discussed what happens in our life when things get too heavy for [place], and we don't have strong supports outside of the hospital. Pt shared 2 of their healthy supports aloud in the group.   Affect/Mood: N/A   Participation Level: Did not attend    Clinical Observations/Individualized Feedback: Abrum did not attend group due to resting in his room.  Plan: Continue to engage patient in RT group sessions 2-3x/week.   Vilma Prader, LRT, CTRS 07/09/2022 11:27 AM

## 2022-07-10 DIAGNOSIS — F203 Undifferentiated schizophrenia: Secondary | ICD-10-CM | POA: Diagnosis not present

## 2022-07-10 LAB — CBC WITH DIFFERENTIAL/PLATELET
Abs Immature Granulocytes: 0.03 10*3/uL (ref 0.00–0.07)
Basophils Absolute: 0.1 10*3/uL (ref 0.0–0.1)
Basophils Relative: 1 %
Eosinophils Absolute: 0.2 10*3/uL (ref 0.0–0.5)
Eosinophils Relative: 2 %
HCT: 42.5 % (ref 39.0–52.0)
Hemoglobin: 14.1 g/dL (ref 13.0–17.0)
Immature Granulocytes: 0 %
Lymphocytes Relative: 32 %
Lymphs Abs: 2.8 10*3/uL (ref 0.7–4.0)
MCH: 29.4 pg (ref 26.0–34.0)
MCHC: 33.2 g/dL (ref 30.0–36.0)
MCV: 88.5 fL (ref 80.0–100.0)
Monocytes Absolute: 0.9 10*3/uL (ref 0.1–1.0)
Monocytes Relative: 10 %
Neutro Abs: 4.8 10*3/uL (ref 1.7–7.7)
Neutrophils Relative %: 55 %
Platelets: 249 10*3/uL (ref 150–400)
RBC: 4.8 MIL/uL (ref 4.22–5.81)
RDW: 12.3 % (ref 11.5–15.5)
WBC: 8.8 10*3/uL (ref 4.0–10.5)
nRBC: 0 % (ref 0.0–0.2)

## 2022-07-10 NOTE — Progress Notes (Signed)
D- Patient alert and oriented x 2-3. Affect flat/mood preoccupied. Denies SI/ HI/ AVH. He denies pain. States he's just "waiting on my money". A- Scheduled medications administered to patient, per MD orders. Support and encouragement provided.  Routine safety checks conducted every 15 minutes.  Patient informed to notify staff with problems or concerns and verbalizes understanding. R- No adverse drug reactions noted. Patient compliant with medications and treatment plan. Patient receptive, calm, and cooperative. He isolates to his room except meals. Patient contracts for safety and remains safe on the unit at this time.

## 2022-07-10 NOTE — Group Note (Signed)
[place] LCSW Group Therapy Note   Group Date: 07/10/2022 Start Time: 1300 End Time: 1400   Type of Therapy/Topic:  Group Therapy:  Emotion Regulation  Participation Level:  Did Not Attend   Mood:  Description of Group:    The purpose of this group is to assist patients in learning to regulate negative emotions and experience positive emotions. Patients will be guided to discuss ways in which they have been vulnerable to their negative emotions. These vulnerabilities will be juxtaposed with experiences of positive emotions or situations, and patients challenged to use positive emotions to combat negative ones. Special emphasis will be placed on coping with negative emotions in conflict situations, and patients will process healthy conflict resolution skills.  Therapeutic Goals: Patient will identify two positive emotions or experiences to reflect on in order to balance out negative emotions:  Patient will label two or more emotions that they find the most difficult to experience:  Patient will be able to demonstrate positive conflict resolution skills through discussion or role plays:   Summary of Patient Progress:   X    Therapeutic Modalities:   Cognitive Behavioral Therapy Feelings Identification Dialectical Behavioral Therapy   Rozann Lesches, LCSW

## 2022-07-10 NOTE — Group Note (Signed)
Date:  07/10/2022 Time:  10:29 AM  Group Topic/Focus:  Community Meeting    Participation Level:  Did Not Attend   Adela Lank [facility] 07/10/2022, 10:29 AM

## 2022-07-10 NOTE — Group Note (Signed)
Recreation Therapy Group Note   Group Topic:Problem Solving  Group Date: 07/10/2022 Start Time: 1000 End Time: 1055 Facilitators: Vilma Prader, LRT, CTRS Location:  Craft Room  Group Description: Life Boat. Patients were given the scenario that they are on a boat that is about to become shipwrecked, leaving them stranded on an [place]. They are asked to make a list of 15 different items that they want to take with them when they are stranded on the [place]. Patients are asked to rank their items from most important to least important, #1 being the most important and #15 being the least. Patients will work individually for the first round to come up with 15 items and then pair up with a peer(s) to condense their list and come up with one list of 15 items between the two of them. Patients or LRT will read aloud the 15 different items to the group after each round. LRT facilitated post-activity processing to discuss how this activity can be used in daily life post discharge.   Affect/Mood: N/A   Participation Level: Did not attend    Clinical Observations/Individualized Feedback: Travis Sandoval did not attend group due to resting in his room.  Plan: Continue to engage patient in RT group sessions 2-3x/week.   Vilma Prader, LRT, CTRS 07/10/2022 11:19 AM

## 2022-07-10 NOTE — Consult Note (Signed)
Pharmacy Consult - Clozapine     42 yo male ordered clozapine 100 mg PO daily  This patient's order has been reviewed for prescribing contraindications.    Clozapine [facility] enrollment Verified: yes on 04/17/22 [facility] patient ID: [id number] Current Outpatient Monitoring: Every 2 weeks   Home Regimen:  225 mg po BID Last dose: unknown   Dose Adjustments This Admission: Dose started at clozapine 100 mg po daily Dose is currently clozapine 350 mg po HS (started 3/12)   Labs: Date    ANC    Submitted? 12/27 2800 Yes 01/03 3500 Yes 01/10   3100 Yes 01/24 3000 Yes 01/31 4400 yes   02/07 4300 yes 02/14 4500 Yes 02/21 5500 Yes 02/28 4080 Yes 03/06 4300 Yes  03/13 4100 Yes 03/20 4800 Yes  Plan: Continue clozapine 350 mg po HS Monitor ANC at least weekly while inpatient    Dorothe Pea, PharmD, BCPS Clinical Pharmacist   07/10/2022 4:25 PM

## 2022-07-10 NOTE — Group Note (Signed)
Date:  07/10/2022 Time:  5:29 PM  Group Topic/Focus:  Activity Group    Participation Level:  Active  Participation Quality:  Appropriate  Affect:  Appropriate  Cognitive:  Appropriate  Insight: Appropriate  Engagement in Group:  Engaged  Modes of Intervention:  Activity  Additional Comments:    Dorena Bodo 07/10/2022, 5:29 PM

## 2022-07-10 NOTE — Progress Notes (Signed)
[facility] MD Progress Note  07/10/2022 4:33 PM Travis Sandoval  MRN:  [id number] Subjective: Follow-up 42 year old man with schizophrenia.  No new complaints no change in presentation Principal Problem: Schizophrenia ([place]) Diagnosis: Principal Problem:   Schizophrenia ([place]) Active Problems:   Psychosis ([place])  Total Time spent with patient: 30 minutes  Past Psychiatric History: Past history of schizophrenia  Past Medical History:  Past Medical History:  Diagnosis Date   ADHD    Bipolar 1 disorder ([place])    Hepatitis C    Schizoaffective disorder ([place])    History reviewed. No pertinent surgical history. Family History:  Family History  Family history unknown: Yes   Family Psychiatric  History: See previous Social History:  Social History   Substance and Sexual Activity  Alcohol Use Not Currently     Social History   Substance and Sexual Activity  Drug Use Not Currently    Social History   Socioeconomic History   Marital status: Unknown    Spouse name: Not on file   Number of children: Not on file   Years of education: Not on file   Highest education level: Not on file  Occupational History   Not on file  Tobacco Use   Smoking status: Every Day    Packs/day: 0.25    Years: 15.00    Additional pack years: 0.00    Total pack years: 3.75    Types: Cigarettes   Smokeless tobacco: Not on file  Vaping Use   Vaping Use: Not on file  Substance and Sexual Activity   Alcohol use: Not Currently   Drug use: Not Currently   Sexual activity: Not Currently  Other Topics Concern   Not on file  Social History Narrative   Not on file   Social Determinants of Health   Financial Resource Strain: Not on file  Food Insecurity: Patient Declined (05/18/2022)   Hunger Vital Sign    Worried About Running Out of Food in the Last Year: Patient declined    [place] in the Last Year: Patient declined  Transportation Needs: Patient Declined (05/18/2022)   [place] -  Hydrologist (Medical): Patient declined    Lack of Transportation (Non-Medical): Patient declined  Physical Activity: Not on file  Stress: Not on file  Social Connections: Not on file   Additional Social History:                         Sleep: Fair  Appetite:  Fair  Current Medications: Current Facility-Administered Medications  Medication Dose Route Frequency Provider Last Rate Last Admin   acetaminophen (TYLENOL) tablet 650 mg  650 mg Oral Q6H PRN [name], NP       alum & mag hydroxide-simeth (MAALOX/MYLANTA) 200-200-20 MG/5ML suspension 30 mL  30 mL Oral Q4H PRN [name], NP   30 mL at 06/28/22 2107   ARIPiprazole ER (ABILIFY MAINTENA) injection 400 mg  400 mg Intramuscular Q28 days Paula Zietz T, MD   400 mg at 06/27/22 1701   cloZAPine (CLOZARIL) tablet 350 mg  350 mg Oral QHS Chazz Philson T, MD   350 mg at 07/09/22 2112   docusate sodium (COLACE) capsule 200 mg  200 mg Oral BID Donye Dauenhauer T, MD   200 mg at 07/10/22 1010   famotidine (PEPCID) tablet 20 mg  20 mg Oral BID Cinthia Rodden, [name], MD   20 mg at 07/10/22 1010  hydrOXYzine (ATARAX) tablet 50 mg  50 mg Oral Q6H PRN Ruth Tully, [name], MD   50 mg at 07/08/22 2100   magnesium citrate solution 1 Bottle  1 Bottle Oral Daily PRN Wynelle Cleveland, RPH   1 Bottle at 07/09/22 1707   magnesium hydroxide (MILK OF MAGNESIA) suspension 30 mL  30 mL Oral Daily PRN Wynelle Cleveland, RPH   30 mL at 07/08/22 0825   nicotine polacrilex (NICORETTE) gum 2 mg  2 mg Oral PRN Clella Mckeel, [name], MD   2 mg at 04/23/22 2200   senna-docusate (Senokot-S) tablet 2 tablet  2 tablet Oral Daily PRN Khyra Viscuso, [name], MD   2 tablet at 06/22/22 2141   ziprasidone (GEODON) injection 20 mg  20 mg Intramuscular Q12H PRN [name], MD        Lab Results:  Results for orders placed or performed during the hospital encounter of 04/16/22 (from the past 48 hour(s))  CBC with Differential/Platelet      Status: None   Collection Time: 07/10/22 11:46 AM  Result Value Ref Range   WBC 8.8 4.0 - 10.5 K/uL   RBC 4.80 4.22 - 5.81 MIL/uL   Hemoglobin 14.1 13.0 - 17.0 g/dL   HCT 42.5 39.0 - 52.0 %   MCV 88.5 80.0 - 100.0 fL   MCH 29.4 26.0 - 34.0 pg   MCHC 33.2 30.0 - 36.0 g/dL   RDW 12.3 11.5 - 15.5 %   Platelets 249 150 - 400 K/uL   nRBC 0.0 0.0 - 0.2 %   Neutrophils Relative % 55 %   Neutro Abs 4.8 1.7 - 7.7 K/uL   Lymphocytes Relative 32 %   Lymphs Abs 2.8 0.7 - 4.0 K/uL   Monocytes Relative 10 %   Monocytes Absolute 0.9 0.1 - 1.0 K/uL   Eosinophils Relative 2 %   Eosinophils Absolute 0.2 0.0 - 0.5 K/uL   Basophils Relative 1 %   Basophils Absolute 0.1 0.0 - 0.1 K/uL   Immature Granulocytes 0 %   Abs Immature Granulocytes 0.03 0.00 - 0.07 K/uL    Comment: Performed at [facility], [address] [place], [place] [address]    Blood Alcohol level:  Lab Results  Component Value Date   [facility] <10 0000000    Metabolic Disorder Labs: Lab Results  Component Value Date   HGBA1C 5.5 04/24/2022   MPG 111 04/24/2022   No results found for: "PROLACTIN" Lab Results  Component Value Date   CHOL 220 (H) 04/24/2022   TRIG 700 (H) 04/24/2022   HDL 35 (L) 04/24/2022   CHOLHDL 6.3 04/24/2022   VLDL UNABLE TO CALCULATE IF TRIGLYCERIDE OVER 400 mg/dL 04/24/2022   LDLCALC UNABLE TO CALCULATE IF TRIGLYCERIDE OVER 400 mg/dL 04/24/2022    Physical Findings: AIMS: Facial and Oral Movements Muscles of Facial Expression: None, normal Lips and Perioral Area: None, normal Jaw: None, normal Tongue: None, normal,Extremity Movements Upper (arms, wrists, hands, fingers): None, normal Lower (legs, knees, ankles, toes): None, normal, Trunk Movements Neck, shoulders, hips: None, normal, Overall Severity Severity of abnormal movements (highest score from questions above): None, normal Incapacitation due to abnormal movements: None, normal Patient's awareness of abnormal movements  (rate only patient's report): No Awareness, Dental Status Current problems with teeth and/or dentures?: No Does patient usually wear dentures?: No  CIWA:    COWS:     Musculoskeletal: Strength & Muscle Tone: within normal limits Gait & Station: normal Patient leans: N/A  Psychiatric Specialty  Exam:  Presentation  General Appearance:  Disheveled  Eye Contact: Minimal  Speech: Pressured  Speech Volume: Normal  Handedness: Right   Mood and Affect  Mood: Euphoric; Irritable  Affect: Inappropriate   Thought Process  Thought Processes: Disorganized  Descriptions of Associations:Loose  Orientation:Partial  Thought Content:Illogical; Scattered  History of Schizophrenia/Schizoaffective disorder:No data recorded Duration of Psychotic Symptoms:No data recorded Hallucinations:No data recorded Ideas of Reference:Delusions; Paranoia  Suicidal Thoughts:No data recorded Homicidal Thoughts:No data recorded  Sensorium  Memory: Immediate Poor; Recent Poor; Remote Poor  Judgment: Poor  Insight: Poor   Executive Functions  Concentration: Fair  Attention Span: Fair  Recall: Poor  Fund of Knowledge: Poor  Language: Poor   Psychomotor Activity  Psychomotor Activity:No data recorded  Assets  Assets: Desire for Improvement; Social Support   Sleep  Sleep:No data recorded   Physical Exam: Physical Exam Constitutional:      Appearance: Normal appearance.  HENT:     Head: Normocephalic and atraumatic.     Mouth/Throat:     Pharynx: Oropharynx is clear.  Eyes:     Pupils: Pupils are equal, round, and reactive to light.  Cardiovascular:     Rate and Rhythm: Normal rate and regular rhythm.  Pulmonary:     Effort: Pulmonary effort is normal.     Breath sounds: Normal breath sounds.  Abdominal:     General: Abdomen is flat.     Palpations: Abdomen is soft.  Musculoskeletal:        General: Normal range of motion.  Skin:    General:  Skin is warm and dry.  Neurological:     General: No focal deficit present.     Mental Status: He is alert. Mental status is at baseline.  Psychiatric:        Mood and Affect: Mood normal.        Thought Content: Thought content normal.    Review of Systems  Constitutional: Negative.   HENT: Negative.    Eyes: Negative.   Respiratory: Negative.    Cardiovascular: Negative.   Gastrointestinal: Negative.   Musculoskeletal: Negative.   Skin: Negative.   Neurological: Negative.   Psychiatric/Behavioral: Negative.     Blood pressure 117/84, pulse (!) 111, temperature 98 F (36.7 C), temperature source Oral, resp. rate 18, height 5\' 9"  (1.753 m), weight 96.4 kg, SpO2 97 %. Body mass index is 31.38 kg/m.   Treatment Plan Summary: Medication management and Plan nothing different nothing out of the ordinary.  Continues to be here just waiting for discharge planning  Alethia Berthold, MD 07/10/2022, 4:33 PM

## 2022-07-10 NOTE — Plan of Care (Signed)
  Problem: Education: Goal: Knowledge of General Education information will improve Description: Including pain rating scale, medication(s)/side effects and non-pharmacologic comfort measures Outcome: Progressing   Problem: Health Behavior/Discharge Planning: Goal: Ability to manage health-related needs will improve Outcome: Progressing   Problem: Clinical Measurements: Goal: Ability to maintain clinical measurements within normal limits will improve Outcome: Progressing Goal: Will remain free from infection Outcome: Progressing Goal: Diagnostic test results will improve Outcome: Progressing Goal: Respiratory complications will improve Outcome: Progressing Goal: Cardiovascular complication will be avoided Outcome: Progressing   Problem: Activity: Goal: Risk for activity intolerance will decrease Outcome: Progressing   Problem: Nutrition: Goal: Adequate nutrition will be maintained Outcome: Progressing   Problem: Coping: Goal: Level of anxiety will decrease Outcome: Progressing   Problem: Elimination: Goal: Will not experience complications related to urinary retention Outcome: Progressing   Problem: Pain Managment: Goal: General experience of comfort will improve Outcome: Progressing   Problem: Safety: Goal: Ability to remain free from injury will improve Outcome: Progressing   Problem: Skin Integrity: Goal: Risk for impaired skin integrity will decrease Outcome: Progressing   Problem: Education: Goal: Knowledge of [place] [facility] Education information/materials will improve Outcome: Progressing Goal: Emotional status will improve Outcome: Progressing Goal: Mental status will improve Outcome: Progressing Goal: Verbalization of understanding the information provided will improve Outcome: Progressing   Problem: Activity: Goal: Interest or engagement in activities will improve Outcome: Progressing Goal: Sleeping patterns will improve Outcome:  Progressing   Problem: Coping: Goal: Ability to verbalize frustrations and anger appropriately will improve Outcome: Progressing Goal: Ability to demonstrate self-control will improve Outcome: Progressing   Problem: Health Behavior/Discharge Planning: Goal: Identification of resources available to assist in meeting health care needs will improve Outcome: Progressing Goal: Compliance with treatment plan for underlying cause of condition will improve Outcome: Progressing   Problem: Physical Regulation: Goal: Ability to maintain clinical measurements within normal limits will improve Outcome: Progressing   Problem: Safety: Goal: Periods of time without injury will increase Outcome: Progressing   Problem: Activity: Goal: Will verbalize the importance of balancing activity with adequate rest periods Outcome: Progressing   Problem: Education: Goal: Will be free of psychotic symptoms Outcome: Progressing Goal: Knowledge of the prescribed therapeutic regimen will improve Outcome: Progressing   Problem: Coping: Goal: Coping ability will improve Outcome: Progressing Goal: Will verbalize feelings Outcome: Progressing   Problem: Health Behavior/Discharge Planning: Goal: Compliance with prescribed medication regimen will improve Outcome: Progressing   Problem: Nutritional: Goal: Ability to achieve adequate nutritional intake will improve Outcome: Progressing   Problem: Role Relationship: Goal: Ability to communicate needs accurately will improve Outcome: Progressing Goal: Ability to interact with others will improve Outcome: Progressing   Problem: Safety: Goal: Ability to redirect hostility and anger into socially appropriate behaviors will improve Outcome: Progressing Goal: Ability to remain free from injury will improve Outcome: Progressing   Problem: Self-Care: Goal: Ability to participate in self-care as condition permits will improve Outcome: Progressing    Problem: Self-Concept: Goal: Will verbalize positive feelings about self Outcome: Progressing   

## 2022-07-10 NOTE — Progress Notes (Signed)
Patient calm and pleasant during assessment denying SI/HI/AVH. Pt compliant with medication administration per MD orders. Pt given education, support, and encouragement to be active in his treatment plan. Pt being monitored Q 15 minutes for safety per unit protocol, remains safe on the unit

## 2022-07-11 DIAGNOSIS — F203 Undifferentiated schizophrenia: Secondary | ICD-10-CM | POA: Diagnosis not present

## 2022-07-11 NOTE — Group Note (Signed)
Date:  07/11/2022 Time:  9:44 AM  Group Topic/Focus:  Goals Group:   The focus of this group is to help patients establish daily goals to achieve during treatment and discuss how the patient can incorporate goal setting into their daily lives to aide in recovery. Community Meeting    Participation Level:  Did Not Attend   Adela Lank [facility] 07/11/2022, 9:44 AM

## 2022-07-11 NOTE — Group Note (Signed)
LCSW Group Therapy Note  Group Date: 07/11/2022 Start Time: 1300 End Time: 1400   Type of Therapy and Topic:  Group Therapy - Healthy vs Unhealthy Coping Skills  Participation Level:  Did Not Attend   Description of Group The focus of this group was to determine what unhealthy coping techniques typically are used by group members and what healthy coping techniques would be helpful in coping with various problems. Patients were guided in becoming aware of the differences between healthy and unhealthy coping techniques. Patients were asked to identify 2-3 healthy coping skills they would like to learn to use more effectively.  Therapeutic Goals Patients learned that coping is what human beings do all day long to deal with various situations in their lives Patients defined and discussed healthy vs unhealthy coping techniques Patients identified their preferred coping techniques and identified whether these were healthy or unhealthy Patients determined 2-3 healthy coping skills they would like to become more familiar with and use more often. Patients provided support and ideas to each other   Summary of Patient Progress:   Patient did not attend group despite encouraged participation.    Therapeutic Modalities Cognitive Behavioral Therapy Motivational [place], [place] 07/11/2022  2:34 PM

## 2022-07-11 NOTE — [facility] Group Notes (Signed)
[place] Group Notes:  (Nursing/MHT/Case Management/Adjunct)  Date:  07/11/2022  Time:  8:48 PM  Type of Therapy:   Wrap up  Participation Level:  Active  Participation Quality:  Appropriate  Affect:  Appropriate  Cognitive:  Alert  Insight:  Good  Engagement in Group:  Engaged and no goals.  Modes of Intervention:  Support  Summary of Progress/Problems:  Nehemiah Settle 07/11/2022, 8:48 PM

## 2022-07-11 NOTE — Progress Notes (Signed)
D- Patient alert and oriented x 3. Affect flat/mood preoccupied. Denies SI/HI/ AVH. He denies pain. He refuses Protonix- states it  makes him "constipated. He request Mylanta for c/o of "indigestion. Instructed on action, dose and adverse reactions r/t Protonix. Also about Clozaril and it's potential for constipation. He states he wants a "liquid"- Maylox or Milk of Magnesia. A- Scheduled medications administered to patient, per MD orders. Support and encouragement provided.  Routine safety checks conducted every 15 minutes. Patient informed to notify staff with problems or concerns. R- No adverse drug reactions noted. Patient compliant with medications and treatment plan. Patient receptive, calm, and cooperative. He isolates to him room excepts for meals. Patient contracts for safety and remains safe on the unit at this time.

## 2022-07-11 NOTE — BH IP Treatment Plan (Signed)
Interdisciplinary Treatment and Diagnostic Plan Update  07/11/2022 Time of Session: 08:30 Travis Sandoval MRN: [id number]  Principal Diagnosis: Schizophrenia [facility])  Secondary Diagnoses: Principal Problem:   Schizophrenia ([place]) Active Problems:   Psychosis ([place])   Current Medications:  Current Facility-Administered Medications  Medication Dose Route Frequency Provider Last Rate Last Admin   acetaminophen (TYLENOL) tablet 650 mg  650 mg Oral Q6H PRN [name], NP       alum & mag hydroxide-simeth (MAALOX/MYLANTA) 200-200-20 MG/5ML suspension 30 mL  30 mL Oral Q4H PRN [name], NP   30 mL at 07/10/22 2153   ARIPiprazole ER (ABILIFY MAINTENA) injection 400 mg  400 mg Intramuscular Q28 days [name], [name], MD   400 mg at 06/27/22 1701   cloZAPine (CLOZARIL) tablet 350 mg  350 mg Oral QHS [name], [name], MD   350 mg at 07/10/22 2117   docusate sodium (COLACE) capsule 200 mg  200 mg Oral BID [name], [name], MD   200 mg at 07/11/22 0856   famotidine (PEPCID) tablet 20 mg  20 mg Oral BID [name], [name], MD   20 mg at 07/10/22 1010   hydrOXYzine (ATARAX) tablet 50 mg  50 mg Oral Q6H PRN [name], [name], MD   50 mg at 07/08/22 2100   magnesium citrate solution 1 Bottle  1 Bottle Oral Daily PRN Wynelle Cleveland, RPH   1 Bottle at 07/09/22 1707   magnesium hydroxide (MILK OF MAGNESIA) suspension 30 mL  30 mL Oral Daily PRN Wynelle Cleveland, RPH   30 mL at 07/08/22 0825   nicotine polacrilex (NICORETTE) gum 2 mg  2 mg Oral PRN [name], [name], MD   2 mg at 04/23/22 2200   senna-docusate (Senokot-S) tablet 2 tablet  2 tablet Oral Daily PRN [name], [name], MD   2 tablet at 06/22/22 2141   ziprasidone (GEODON) injection 20 mg  20 mg Intramuscular Q12H PRN [name], MD       PTA Medications: Medications Prior to Admission  Medication Sig Dispense Refill Last Dose   ABILIFY MAINTENA 400 MG SRER injection Inject 400 mg into the muscle every 28 (twenty-eight) days.       ARIPiprazole (ABILIFY) 10 MG tablet Take 10 mg by mouth daily. (Patient not taking: Reported on 04/16/2022)      atomoxetine (STRATTERA) 40 MG capsule Take 40 mg by mouth daily. (Patient not taking: Reported on 04/16/2022)      atorvastatin (LIPITOR) 40 MG tablet Take 40 mg by mouth daily.      budesonide-formoterol (SYMBICORT) 160-4.5 MCG/ACT inhaler Inhale 2 puffs into the lungs.      cetirizine (ZYRTEC) 10 MG tablet Take 10 mg by mouth daily.      clozapine (CLOZARIL) 200 MG tablet Take 200 mg by mouth 2 (two) times daily. Take along with one 25 mg tablet for total 225 mg twice daily      cloZAPine (CLOZARIL) 25 MG tablet Take 25 mg by mouth 2 (two) times daily. Take along with one 200 mg tablet for total 225 mg twice daily      Dexlansoprazole (DEXILANT) 30 MG capsule Take 30 mg by mouth daily. (Patient not taking: Reported on 04/16/2022)      hydrOXYzine (VISTARIL) 50 MG capsule Take 50 mg by mouth 3 (three) times daily as needed for itching. (Patient not taking: Reported on 04/16/2022)      Melatonin 5 MG TABS Take 5 mg by mouth at bedtime  as needed (sleep).      metoprolol succinate (TOPROL-XL) 25 MG 24 hr tablet Take 12.5 mg by mouth daily.      Phosphatidyl Choline 65 MG TABS Take 1 tablet by mouth daily.  (Patient not taking: Reported on 04/16/2022)      valproic acid (DEPAKENE) 250 MG/5ML solution Take 750-1,000 mLs by mouth 2 (two) times daily. 1000 mg (20 mL) every morning and 750 mg (15 mL) daily at bedtime       Patient Stressors: Other: Psychosis    Patient Strengths: Ability for insight  Active sense of humor  Average or above average intelligence  Capable of independent living  Supportive family/friends   Treatment Modalities: Medication Management, Group therapy, Case management,  1 to 1 session with clinician, Psychoeducation, Recreational therapy.   Physician Treatment Plan for Primary Diagnosis: Schizophrenia ([place]) Long Term Goal(s):     Short Term Goals: None, pt  is a poor hx.  Medication Management: Evaluate patient's response, side effects, and tolerance of medication regimen.  Therapeutic Interventions: 1 to 1 sessions, Unit Group sessions and Medication administration.  Evaluation of Outcomes: Progressing  Physician Treatment Plan for Secondary Diagnosis: Principal Problem:   Schizophrenia ([place]) Active Problems:   Psychosis ([place])  Long Term Goal(s):     Short Term Goals: None, pt is a poor hx.     Medication Management: Evaluate patient's response, side effects, and tolerance of medication regimen.  Therapeutic Interventions: 1 to 1 sessions, Unit Group sessions and Medication administration.  Evaluation of Outcomes: Progressing   RN Treatment Plan for Primary Diagnosis: Schizophrenia ([place]) Long Term Goal(s): Knowledge of disease and therapeutic regimen to maintain health will improve  Short Term Goals: Ability to demonstrate self-control, Ability to participate in decision making will improve, Ability to verbalize feelings will improve, Ability to disclose and discuss suicidal ideas, Ability to identify and develop effective coping behaviors will improve, and Compliance with prescribed medications will improve  Medication Management: RN will administer medications as ordered by provider, will assess and evaluate patient's response and provide education to patient for prescribed medication. RN will report any adverse and/or side effects to prescribing provider.  Therapeutic Interventions: 1 on 1 counseling sessions, Psychoeducation, Medication administration, Evaluate responses to treatment, Monitor vital signs and CBGs as ordered, Perform/monitor CIWA, COWS, AIMS and Fall Risk screenings as ordered, Perform wound care treatments as ordered.  Evaluation of Outcomes: Progressing   LCSW Treatment Plan for Primary Diagnosis: Schizophrenia ([place]) Long Term Goal(s): Safe transition to appropriate next level of care at discharge, Engage  patient in therapeutic group addressing interpersonal concerns.  Short Term Goals: Engage patient in aftercare planning with referrals and resources, Increase social support, Increase ability to appropriately verbalize feelings, Increase emotional regulation, Facilitate acceptance of mental health diagnosis and concerns, and Increase skills for wellness and recovery  Therapeutic Interventions: Assess for all discharge needs, 1 to 1 time with Social worker, Explore available resources and support systems, Assess for adequacy in community support network, Educate family and significant other(s) on suicide prevention, Complete Psychosocial Assessment, Interpersonal group therapy.  Evaluation of Outcomes: Progressing   Progress in Treatment: Attending groups: No. Participating in groups: No. Taking medication as prescribed: Yes. Toleration medication: Yes. Family/Significant other contact made: Yes, individual(s) contacted:  SPE completed with patient's father. Patient understands diagnosis: No. Discussing patient identified problems/goals with staff: Yes. Medical problems stabilized or resolved: Yes. Denies suicidal/homicidal ideation: Yes. Issues/concerns per patient self-inventory: No. Other: none.  New problem(s) identified: No, Describe:  none Update 06/30/2021:  No changes at this time. Update 07/06/2022:  No changes at this time. Update 07/11/22: No changes at this time.   New Short Term/Long Term Goal(s): elimination of symptoms of psychosis, medication management for mood stabilization; elimination of SI thoughts; development of comprehensive mental wellness plan. Update 06/30/2021:  No changes at this time. Update 07/06/2022:  No changes at this time. Update 07/11/22: No changes at this time.   Patient Goals:  No additional treatment goals identified by patient. Update 06/30/2021:  No changes at this time. Update 07/06/2022:  No changes at this time. Update 07/11/22: No changes at this time.    Discharge Plan or Barriers: Patient remains on the unit as placement continues to be sought. Funding remains the concern at this time and biggest barrier to placement at this time. Update 06/30/2021:  No changes at this time. Update 07/06/2022:  No changes at this time. Update 07/11/22: No changes at this time.   Reason for Continuation of Hospitalization: Anxiety Delusions  Depression Hallucinations   Estimated Length of Stay: TBD Update 07/06/2022:  TBD Update 07/11/22: No changes at this time.   Last 3 [place] Suicide Severity Risk Score: Flowsheet Row Admission (Current) from 04/16/2022 in [place] ED from 04/15/2022 in [facility] Emergency Department at [place] No Risk No Risk       Last PHQ 2/9 Scores:     No data to display          Scribe for Treatment Team: Shirl Harris, Marlinda Mike 07/11/2022 9:37 AM

## 2022-07-11 NOTE — Progress Notes (Signed)
[facility] MD Progress Note  07/11/2022 1:24 PM Travis Sandoval  MRN:  [id number] Subjective: Follow-up 42 year old man with schizophrenia.  No change presentation.  Affect is euthymic no complaints.  Not disorganized as long as things are Simple.  Unable to care for self outside the hospital we have no place for him to go Principal Problem: Schizophrenia ([place]) Diagnosis: Principal Problem:   Schizophrenia ([place]) Active Problems:   Psychosis ([place])  Total Time spent with patient: 30 minutes  Past Psychiatric History: Past history of schizophrenia  Past Medical History:  Past Medical History:  Diagnosis Date   ADHD    Bipolar 1 disorder ([place])    Hepatitis C    Schizoaffective disorder ([place])    History reviewed. No pertinent surgical history. Family History:  Family History  Family history unknown: Yes   Family Psychiatric  History: See previous Social History:  Social History   Substance and Sexual Activity  Alcohol Use Not Currently     Social History   Substance and Sexual Activity  Drug Use Not Currently    Social History   Socioeconomic History   Marital status: Unknown    Spouse name: Not on file   Number of children: Not on file   Years of education: Not on file   Highest education level: Not on file  Occupational History   Not on file  Tobacco Use   Smoking status: Every Day    Packs/day: 0.25    Years: 15.00    Additional pack years: 0.00    Total pack years: 3.75    Types: Cigarettes   Smokeless tobacco: Not on file  Vaping Use   Vaping Use: Not on file  Substance and Sexual Activity   Alcohol use: Not Currently   Drug use: Not Currently   Sexual activity: Not Currently  Other Topics Concern   Not on file  Social History Narrative   Not on file   Social Determinants of Health   Financial Resource Strain: Not on file  Food Insecurity: Patient Declined (05/18/2022)   Hunger Vital Sign    Worried About Running Out of Food in the Last Year: Patient  declined    [place] in the Last Year: Patient declined  Transportation Needs: Patient Declined (05/18/2022)   [place] - Hydrologist (Medical): Patient declined    Lack of Transportation (Non-Medical): Patient declined  Physical Activity: Not on file  Stress: Not on file  Social Connections: Not on file   Additional Social History:                         Sleep: Fair  Appetite:  Fair  Current Medications: Current Facility-Administered Medications  Medication Dose Route Frequency Provider Last Rate Last Admin   acetaminophen (TYLENOL) tablet 650 mg  650 mg Oral Q6H PRN [name], NP       alum & mag hydroxide-simeth (MAALOX/MYLANTA) 200-200-20 MG/5ML suspension 30 mL  30 mL Oral Q4H PRN [name], NP   30 mL at 07/10/22 2153   ARIPiprazole ER (ABILIFY MAINTENA) injection 400 mg  400 mg Intramuscular Q28 days Henok Heacock T, MD   400 mg at 06/27/22 1701   cloZAPine (CLOZARIL) tablet 350 mg  350 mg Oral QHS Erbie Arment T, MD   350 mg at 07/10/22 2117   docusate sodium (COLACE) capsule 200 mg  200 mg Oral BID Raziyah Vanvleck, [name], MD  200 mg at 07/11/22 0856   famotidine (PEPCID) tablet 20 mg  20 mg Oral BID Jeannelle Wiens, [name], MD   20 mg at 07/10/22 1010   hydrOXYzine (ATARAX) tablet 50 mg  50 mg Oral Q6H PRN Giannah Zavadil, [name], MD   50 mg at 07/08/22 2100   magnesium citrate solution 1 Bottle  1 Bottle Oral Daily PRN Wynelle Cleveland, RPH   1 Bottle at 07/09/22 1707   magnesium hydroxide (MILK OF MAGNESIA) suspension 30 mL  30 mL Oral Daily PRN Wynelle Cleveland, RPH   30 mL at 07/08/22 0825   nicotine polacrilex (NICORETTE) gum 2 mg  2 mg Oral PRN Marieta Markov, [name], MD   2 mg at 04/23/22 2200   senna-docusate (Senokot-S) tablet 2 tablet  2 tablet Oral Daily PRN Taila Basinski, [name], MD   2 tablet at 06/22/22 2141   ziprasidone (GEODON) injection 20 mg  20 mg Intramuscular Q12H PRN [name], MD        Lab Results:  Results for  orders placed or performed during the hospital encounter of 04/16/22 (from the past 48 hour(s))  CBC with Differential/Platelet     Status: None   Collection Time: 07/10/22 11:46 AM  Result Value Ref Range   WBC 8.8 4.0 - 10.5 K/uL   RBC 4.80 4.22 - 5.81 MIL/uL   Hemoglobin 14.1 13.0 - 17.0 g/dL   HCT 42.5 39.0 - 52.0 %   MCV 88.5 80.0 - 100.0 fL   MCH 29.4 26.0 - 34.0 pg   MCHC 33.2 30.0 - 36.0 g/dL   RDW 12.3 11.5 - 15.5 %   Platelets 249 150 - 400 K/uL   nRBC 0.0 0.0 - 0.2 %   Neutrophils Relative % 55 %   Neutro Abs 4.8 1.7 - 7.7 K/uL   Lymphocytes Relative 32 %   Lymphs Abs 2.8 0.7 - 4.0 K/uL   Monocytes Relative 10 %   Monocytes Absolute 0.9 0.1 - 1.0 K/uL   Eosinophils Relative 2 %   Eosinophils Absolute 0.2 0.0 - 0.5 K/uL   Basophils Relative 1 %   Basophils Absolute 0.1 0.0 - 0.1 K/uL   Immature Granulocytes 0 %   Abs Immature Granulocytes 0.03 0.00 - 0.07 K/uL    Comment: Performed at [facility], [address] [place], [place] [address]    Blood Alcohol level:  Lab Results  Component Value Date   [facility] <10 [phone]    Metabolic Disorder Labs: Lab Results  Component Value Date   HGBA1C 5.5 04/24/2022   MPG 111 04/24/2022   No results found for: "PROLACTIN" Lab Results  Component Value Date   CHOL 220 (H) 04/24/2022   TRIG 700 (H) 04/24/2022   HDL 35 (L) 04/24/2022   CHOLHDL 6.3 04/24/2022   VLDL UNABLE TO CALCULATE IF TRIGLYCERIDE OVER 400 mg/dL 04/24/2022   LDLCALC UNABLE TO CALCULATE IF TRIGLYCERIDE OVER 400 mg/dL 04/24/2022    Physical Findings: AIMS: Facial and Oral Movements Muscles of Facial Expression: None, normal Lips and Perioral Area: None, normal Jaw: None, normal Tongue: None, normal,Extremity Movements Upper (arms, wrists, hands, fingers): None, normal Lower (legs, knees, ankles, toes): None, normal, Trunk Movements Neck, shoulders, hips: None, normal, Overall Severity Severity of abnormal movements (highest score from  questions above): None, normal Incapacitation due to abnormal movements: None, normal Patient's awareness of abnormal movements (rate only patient's report): No Awareness, Dental Status Current problems with teeth and/or dentures?: No Does patient usually wear  dentures?: No  CIWA:    COWS:     Musculoskeletal: Strength & Muscle Tone: within normal limits Gait & Station: normal Patient leans: N/A  Psychiatric Specialty Exam:  Presentation  General Appearance:  Disheveled  Eye Contact: Minimal  Speech: Pressured  Speech Volume: Normal  Handedness: Right   Mood and Affect  Mood: Euphoric; Irritable  Affect: Inappropriate   Thought Process  Thought Processes: Disorganized  Descriptions of Associations:Loose  Orientation:Partial  Thought Content:Illogical; Scattered  History of Schizophrenia/Schizoaffective disorder:No data recorded Duration of Psychotic Symptoms:No data recorded Hallucinations:No data recorded Ideas of Reference:Delusions; Paranoia  Suicidal Thoughts:No data recorded Homicidal Thoughts:No data recorded  Sensorium  Memory: Immediate Poor; Recent Poor; Remote Poor  Judgment: Poor  Insight: Poor   Executive Functions  Concentration: Fair  Attention Span: Fair  Recall: Poor  Fund of Knowledge: Poor  Language: Poor   Psychomotor Activity  Psychomotor Activity:No data recorded  Assets  Assets: Desire for Improvement; Social Support   Sleep  Sleep:No data recorded   Physical Exam: Physical Exam Vitals and nursing note reviewed.  Constitutional:      Appearance: Normal appearance.  HENT:     Head: Normocephalic and atraumatic.     Mouth/Throat:     Pharynx: Oropharynx is clear.  Eyes:     Pupils: Pupils are equal, round, and reactive to light.  Cardiovascular:     Rate and Rhythm: Normal rate and regular rhythm.  Pulmonary:     Effort: Pulmonary effort is normal.     Breath sounds: Normal breath  sounds.  Abdominal:     General: Abdomen is flat.     Palpations: Abdomen is soft.  Musculoskeletal:        General: Normal range of motion.  Skin:    General: Skin is warm and dry.  Neurological:     General: No focal deficit present.     Mental Status: He is alert. Mental status is at baseline.  Psychiatric:        Attention and Perception: Attention normal.        Mood and Affect: Mood normal. Affect is blunt.        Speech: Speech normal.        Behavior: Behavior is cooperative.        Cognition and Memory: Cognition normal.    Review of Systems  Constitutional: Negative.   HENT: Negative.    Eyes: Negative.   Respiratory: Negative.    Cardiovascular: Negative.   Gastrointestinal: Negative.   Musculoskeletal: Negative.   Skin: Negative.   Neurological: Negative.   Psychiatric/Behavioral: Negative.     Blood pressure (!) 124/92, pulse (!) 116, temperature (!) 97.5 F (36.4 C), temperature source Oral, resp. rate 18, height 5\' 9"  (1.753 m), weight 96.4 kg, SpO2 97 %. Body mass index is 31.38 kg/m.   Treatment Plan Summary: Plan no change to medication treatment or current plan.  Encourage patient to consider coming out of his room more often.  Still looking for discharge planning  Alethia Berthold, MD 07/11/2022, 1:24 PM

## 2022-07-11 NOTE — Plan of Care (Signed)
  Problem: Education: Goal: Knowledge of General Education information will improve Description: Including pain rating scale, medication(s)/side effects and non-pharmacologic comfort measures Outcome: Progressing   Problem: Health Behavior/Discharge Planning: Goal: Ability to manage health-related needs will improve Outcome: Progressing   Problem: Clinical Measurements: Goal: Ability to maintain clinical measurements within normal limits will improve Outcome: Progressing Goal: Will remain free from infection Outcome: Progressing Goal: Diagnostic test results will improve Outcome: Progressing Goal: Respiratory complications will improve Outcome: Progressing Goal: Cardiovascular complication will be avoided Outcome: Progressing   Problem: Activity: Goal: Risk for activity intolerance will decrease Outcome: Progressing   Problem: Nutrition: Goal: Adequate nutrition will be maintained Outcome: Progressing   Problem: Coping: Goal: Level of anxiety will decrease Outcome: Progressing   Problem: Elimination: Goal: Will not experience complications related to urinary retention Outcome: Progressing   Problem: Pain Managment: Goal: General experience of comfort will improve Outcome: Progressing   Problem: Safety: Goal: Ability to remain free from injury will improve Outcome: Progressing   Problem: Skin Integrity: Goal: Risk for impaired skin integrity will decrease Outcome: Progressing   Problem: Education: Goal: Knowledge of [place] [facility] Education information/materials will improve Outcome: Progressing Goal: Emotional status will improve Outcome: Progressing Goal: Mental status will improve Outcome: Progressing Goal: Verbalization of understanding the information provided will improve Outcome: Progressing   Problem: Activity: Goal: Interest or engagement in activities will improve Outcome: Progressing Goal: Sleeping patterns will improve Outcome:  Progressing   Problem: Coping: Goal: Ability to verbalize frustrations and anger appropriately will improve Outcome: Progressing Goal: Ability to demonstrate self-control will improve Outcome: Progressing   Problem: Health Behavior/Discharge Planning: Goal: Identification of resources available to assist in meeting health care needs will improve Outcome: Progressing Goal: Compliance with treatment plan for underlying cause of condition will improve Outcome: Progressing   Problem: Physical Regulation: Goal: Ability to maintain clinical measurements within normal limits will improve Outcome: Progressing   Problem: Safety: Goal: Periods of time without injury will increase Outcome: Progressing   Problem: Activity: Goal: Will verbalize the importance of balancing activity with adequate rest periods Outcome: Progressing   Problem: Education: Goal: Will be free of psychotic symptoms Outcome: Progressing Goal: Knowledge of the prescribed therapeutic regimen will improve Outcome: Progressing   Problem: Coping: Goal: Coping ability will improve Outcome: Progressing Goal: Will verbalize feelings Outcome: Progressing   Problem: Health Behavior/Discharge Planning: Goal: Compliance with prescribed medication regimen will improve Outcome: Progressing   Problem: Nutritional: Goal: Ability to achieve adequate nutritional intake will improve Outcome: Progressing   Problem: Role Relationship: Goal: Ability to communicate needs accurately will improve Outcome: Progressing Goal: Ability to interact with others will improve Outcome: Progressing   Problem: Safety: Goal: Ability to redirect hostility and anger into socially appropriate behaviors will improve Outcome: Progressing Goal: Ability to remain free from injury will improve Outcome: Progressing   Problem: Self-Care: Goal: Ability to participate in self-care as condition permits will improve Outcome: Progressing    Problem: Self-Concept: Goal: Will verbalize positive feelings about self Outcome: Progressing   

## 2022-07-11 NOTE — Group Note (Signed)
Recreation Therapy Group Note   Group Topic:Relaxation  Group Date: 07/11/2022 Start Time: 1000 End Time: 1050 Facilitators: Vilma Prader, LRT, CTRS Location:  Dayroom  Group Description: PMR (Progressive Muscle Relaxation). LRT asks patients their current level of stress/anxiety from 1-10, with 10 being the highest. LRT educates patients on what PMR is and the benefits that come from it. Patients are asked to sit with their feet flat on the floor while sitting up and all the way back in their chair, if possible. LRT follows prompt that requires the patients to tense and release different muscles in their body and focus on their breathing. During session, lights are off and soft music is being played. At the end of the prompt, LRT asks patients to rank their current levels of stress/anxiety from 1-10, 10 being the highest.   Affect/Mood: N/A   Participation Level: Did not attend    Clinical Observations/Individualized Feedback: Travis Sandoval did not attend group due to restring in his room.  Plan: Continue to engage patient in RT group sessions 2-3x/week.   Vilma Prader, LRT, CTRS 07/11/2022 11:22 AM

## 2022-07-12 DIAGNOSIS — F203 Undifferentiated schizophrenia: Secondary | ICD-10-CM | POA: Diagnosis not present

## 2022-07-12 NOTE — Progress Notes (Signed)
Patient calm and pleasant during assessment denying SI/HI/AVH. Pt compliant with medication administration per MD orders. Pt given education, support, and encouragement to be active in his treatment plan. Pt being monitored Q 15 minutes for safety per unit protocol, remains safe on the unit

## 2022-07-12 NOTE — Progress Notes (Signed)
[facility] MD Progress Note  07/12/2022 5:54 PM Travis Sandoval  MRN:  [id number] Subjective: Follow-up patient with schizophrenia.  No new complaint.  Sits in his room most of the time talking to himself Principal Problem: Schizophrenia ([place]) Diagnosis: Principal Problem:   Schizophrenia ([place]) Active Problems:   Psychosis ([place])  Total Time spent with patient: 20 minutes  Past Psychiatric History: Past history of schizophrenia  Past Medical History:  Past Medical History:  Diagnosis Date   ADHD    Bipolar 1 disorder ([place])    Hepatitis C    Schizoaffective disorder ([place])    History reviewed. No pertinent surgical history. Family History:  Family History  Family history unknown: Yes   Family Psychiatric  History: See previous Social History:  Social History   Substance and Sexual Activity  Alcohol Use Not Currently     Social History   Substance and Sexual Activity  Drug Use Not Currently    Social History   Socioeconomic History   Marital status: Unknown    Spouse name: Not on file   Number of children: Not on file   Years of education: Not on file   Highest education level: Not on file  Occupational History   Not on file  Tobacco Use   Smoking status: Every Day    Packs/day: 0.25    Years: 15.00    Additional pack years: 0.00    Total pack years: 3.75    Types: Cigarettes   Smokeless tobacco: Not on file  Vaping Use   Vaping Use: Not on file  Substance and Sexual Activity   Alcohol use: Not Currently   Drug use: Not Currently   Sexual activity: Not Currently  Other Topics Concern   Not on file  Social History Narrative   Not on file   Social Determinants of Health   Financial Resource Strain: Not on file  Food Insecurity: Patient Declined (05/18/2022)   Hunger Vital Sign    Worried About Running Out of Food in the Last Year: Patient declined    [place] in the Last Year: Patient declined  Transportation Needs: Patient Declined (05/18/2022)    [place] - Hydrologist (Medical): Patient declined    Lack of Transportation (Non-Medical): Patient declined  Physical Activity: Not on file  Stress: Not on file  Social Connections: Not on file   Additional Social History:                         Sleep: Fair  Appetite:  Fair  Current Medications: Current Facility-Administered Medications  Medication Dose Route Frequency Provider Last Rate Last Admin   acetaminophen (TYLENOL) tablet 650 mg  650 mg Oral Q6H PRN [name], NP       alum & mag hydroxide-simeth (MAALOX/MYLANTA) 200-200-20 MG/5ML suspension 30 mL  30 mL Oral Q4H PRN [name], NP   30 mL at 07/10/22 2153   ARIPiprazole ER (ABILIFY MAINTENA) injection 400 mg  400 mg Intramuscular Q28 days Cannon Arreola T, MD   400 mg at 06/27/22 1701   cloZAPine (CLOZARIL) tablet 350 mg  350 mg Oral QHS Elisama Thissen T, MD   350 mg at 07/11/22 2111   docusate sodium (COLACE) capsule 200 mg  200 mg Oral BID Celena Lanius T, MD   200 mg at 07/12/22 1204   famotidine (PEPCID) tablet 20 mg  20 mg Oral BID Dimitrius Steedman, [name], MD  20 mg at 07/10/22 1010   hydrOXYzine (ATARAX) tablet 50 mg  50 mg Oral Q6H PRN Latreece Mochizuki T, MD   50 mg at 07/08/22 2100   magnesium citrate solution 1 Bottle  1 Bottle Oral Daily PRN Wynelle Cleveland, RPH   1 Bottle at 07/09/22 1707   magnesium hydroxide (MILK OF MAGNESIA) suspension 30 mL  30 mL Oral Daily PRN Wynelle Cleveland, RPH   30 mL at 07/12/22 1721   nicotine polacrilex (NICORETTE) gum 2 mg  2 mg Oral PRN Manasvini Whatley, [name], MD   2 mg at 04/23/22 2200   senna-docusate (Senokot-S) tablet 2 tablet  2 tablet Oral Daily PRN Chesley Valls, [name], MD   2 tablet at 06/22/22 2141   ziprasidone (GEODON) injection 20 mg  20 mg Intramuscular Q12H PRN [name], MD        Lab Results: No results found for this or any previous visit (from the past 48 hour(s)).  Blood Alcohol level:  Lab Results  Component Value  Date   ETH <10 0000000    Metabolic Disorder Labs: Lab Results  Component Value Date   HGBA1C 5.5 04/24/2022   MPG 111 04/24/2022   No results found for: "PROLACTIN" Lab Results  Component Value Date   CHOL 220 (H) 04/24/2022   TRIG 700 (H) 04/24/2022   HDL 35 (L) 04/24/2022   CHOLHDL 6.3 04/24/2022   VLDL UNABLE TO CALCULATE IF TRIGLYCERIDE OVER 400 mg/dL 04/24/2022   LDLCALC UNABLE TO CALCULATE IF TRIGLYCERIDE OVER 400 mg/dL 04/24/2022    Physical Findings: AIMS: Facial and Oral Movements Muscles of Facial Expression: None, normal Lips and Perioral Area: None, normal Jaw: None, normal Tongue: None, normal,Extremity Movements Upper (arms, wrists, hands, fingers): None, normal Lower (legs, knees, ankles, toes): None, normal, Trunk Movements Neck, shoulders, hips: None, normal, Overall Severity Severity of abnormal movements (highest score from questions above): None, normal Incapacitation due to abnormal movements: None, normal Patient's awareness of abnormal movements (rate only patient's report): No Awareness, Dental Status Current problems with teeth and/or dentures?: No Does patient usually wear dentures?: No  CIWA:    COWS:     Musculoskeletal: Strength & Muscle Tone: within normal limits Gait & Station: normal Patient leans: N/A  Psychiatric Specialty Exam:  Presentation  General Appearance:  Disheveled  Eye Contact: Minimal  Speech: Pressured  Speech Volume: Normal  Handedness: Right   Mood and Affect  Mood: Euphoric; Irritable  Affect: Inappropriate   Thought Process  Thought Processes: Disorganized  Descriptions of Associations:Loose  Orientation:Partial  Thought Content:Illogical; Scattered  History of Schizophrenia/Schizoaffective disorder:No data recorded Duration of Psychotic Symptoms:No data recorded Hallucinations:No data recorded Ideas of Reference:Delusions; Paranoia  Suicidal Thoughts:No data recorded Homicidal  Thoughts:No data recorded  Sensorium  Memory: Immediate Poor; Recent Poor; Remote Poor  Judgment: Poor  Insight: Poor   Executive Functions  Concentration: Fair  Attention Span: Fair  Recall: Poor  Fund of Knowledge: Poor  Language: Poor   Psychomotor Activity  Psychomotor Activity:No data recorded  Assets  Assets: Desire for Improvement; Social Support   Sleep  Sleep:No data recorded   Physical Exam: Physical Exam Vitals and nursing note reviewed.  Constitutional:      Appearance: Normal appearance.  HENT:     Head: Normocephalic and atraumatic.     Mouth/Throat:     Pharynx: Oropharynx is clear.  Eyes:     Pupils: Pupils are equal, round, and reactive to light.  Cardiovascular:  Rate and Rhythm: Normal rate and regular rhythm.  Pulmonary:     Effort: Pulmonary effort is normal.     Breath sounds: Normal breath sounds.  Abdominal:     General: Abdomen is flat.     Palpations: Abdomen is soft.  Musculoskeletal:        General: Normal range of motion.  Skin:    General: Skin is warm and dry.  Neurological:     General: No focal deficit present.     Mental Status: He is alert. Mental status is at baseline.  Psychiatric:        Attention and Perception: Attention normal.        Mood and Affect: Mood normal.        Speech: Speech normal.        Behavior: Behavior is cooperative.        Thought Content: Thought content is delusional.        Cognition and Memory: Cognition normal.    Review of Systems  Constitutional: Negative.   HENT: Negative.    Eyes: Negative.   Respiratory: Negative.    Cardiovascular: Negative.   Gastrointestinal: Negative.   Musculoskeletal: Negative.   Skin: Negative.   Neurological: Negative.   Psychiatric/Behavioral: Negative.     Blood pressure 114/84, pulse (!) 113, temperature 98.3 F (36.8 C), temperature source Oral, resp. rate 18, height 5\' 9"  (1.753 m), weight 96.4 kg, SpO2 98 %. Body mass index  is 31.38 kg/m.   Treatment Plan Summary: Medication management and Plan no change to medication.  I am going to check a clozapine level and get it drawn tomorrow morning so we can see how much more we can increase the Clozapine.  Alethia Berthold, MD 07/12/2022, 5:54 PM

## 2022-07-12 NOTE — Plan of Care (Signed)
D: Patient alert and oriented. Patient denies pain. Patient denies anxiety and depression. Patient denies SI/HI/AVH. Patient remains isolative to room during shift with exception to coming out for meals and medication. Patient did refuse scheduled colace and Pepcid stating that those medications do not work, instead patient stated they prefer the PRN milk of magnesia.  A: Scheduled medications administered to patient, per MD orders.  Support and encouragement provided to patient.  Q15 minute safety checks maintained.   R: Patient compliant with medication administration and treatment plan. No adverse drug reactions noted. Patient remains safe on the unit at this time. Problem: Education: Goal: Knowledge of General Education information will improve Description: Including pain rating scale, medication(s)/side effects and non-pharmacologic comfort measures Outcome: Progressing   Problem: Clinical Measurements: Goal: Ability to maintain clinical measurements within normal limits will improve Outcome: Progressing   Problem: Education: Goal: Knowledge of [place] General Education information/materials will improve Outcome: Progressing Goal: Verbalization of understanding the information provided will improve Outcome: Progressing   Problem: Education: Goal: Knowledge of the prescribed therapeutic regimen will improve Outcome: Progressing

## 2022-07-12 NOTE — Group Note (Signed)
Date:  07/12/2022 Time:  6:46 PM  Group Topic/Focus:  Goals Group:   The focus of this group is to help patients establish daily goals to achieve during treatment and discuss how the patient can incorporate goal setting into their daily lives to aide in recovery.  Community Group  Participation Level:  Did Not Attend  Nikki Dom 07/12/2022, 6:46 PM

## 2022-07-12 NOTE — Group Note (Signed)
Recreation Therapy Group Note   Group Topic:Self-Esteem  Group Date: 07/12/2022 Start Time: 1000 End Time: 1105 Facilitators: Vilma Prader, LRT, CTRS Location:  Craft Room  Group Description: Patients and LRT discussed the importance of self-love and self-esteem. Pt completed a worksheet that helps them identify 24 different strengths and qualities about themselves. Pt encouraged to read aloud at least 3 off their sheet to the group. LRT and pts discussed how this can be applied to daily life post-discharge.  Pt's then played "Positive Affirmation Bingo" afterwards, with journals or stress balls as bingo prizes.   Affect/Mood: N/A   Participation Level: Did not attend    Clinical Observations/Individualized Feedback: Travis Sandoval did not attend group due to resting in his room.  Plan: Continue to engage patient in RT group sessions 2-3x/week.   Vilma Prader, LRT, CTRS 07/12/2022 11:53 AM

## 2022-07-13 DIAGNOSIS — F200 Paranoid schizophrenia: Secondary | ICD-10-CM | POA: Diagnosis not present

## 2022-07-13 MED ORDER — MINERAL OIL PO OIL
TOPICAL_OIL | Freq: Every day | ORAL | Status: DC | PRN
  Filled 2022-07-13 (×2): qty 30

## 2022-07-13 NOTE — Progress Notes (Signed)
D: Patient alert and oriented x 3, affect is flat and thoughts are organized, he denies SI/HI/AVH. Patient is pleasant and cooperative.  he appears less anxious and he is interacting with peers and staff appropriately.  A: Patient was offered support and encouragement. He was given scheduled medications and encouraged to attend groups.  15 minute checks were done for safety.  R: Patient attends groups and interacts with peers and staff, he is complaint with medication this evening. Patient has no complaints and he is receptive to treatment and safety maintained on unit.

## 2022-07-13 NOTE — Group Note (Signed)
Date:  07/13/2022 Time:  3:46 PM  Group Topic/Focus:  Music Therapy, Outside court yard     Participation Level:  Did Not Attend    Ladona Mow 07/13/2022, 3:46 PM

## 2022-07-13 NOTE — Plan of Care (Signed)
D: Patient alert and oriented. Patient denies pain. Patient denies anxiety and depression. Patient denies SI/HI/AVH. Patient refused scheduled 0800 and 1700 medications.  A:  Q15 minute safety checks maintained.   R: Patient remains safe on the unit at this time. Problem: Education: Goal: Knowledge of [place] General Education information/materials will improve Outcome: Progressing Goal: Verbalization of understanding the information provided will improve Outcome: Progressing

## 2022-07-13 NOTE — Group Note (Signed)
LCSW Group Therapy Note   Group Date: 07/13/2022 Start Time: 1000 End Time: 1100  Type of Therapy/Topic:  Group Therapy:  Feelings about Diagnosis  Participation Level:  Did Not Attend   Mood: N/A   Description of Group:    This group will allow patients to explore their thoughts and feelings about diagnoses they have received. Patients will be guided to explore their level of understanding and acceptance of these diagnoses. Facilitator will encourage patients to process their thoughts and feelings about the reactions of others to their diagnosis, and will guide patients in identifying ways to discuss their diagnosis with significant others in their lives. This group will be process-oriented, with patients participating in exploration of their own experiences as well as giving and receiving support and challenge from other group members.   Therapeutic Goals: 1. Patient will demonstrate understanding of diagnosis as evidence by identifying two or more symptoms of the disorder:  2. Patient will be able to express two feelings regarding the diagnosis 3. Patient will demonstrate ability to communicate their needs through discussion and/or role plays  Summary of Patient Progress: Patient was invited to group, did not attend.      Therapeutic Modalities:   Cognitive Behavioral Therapy Brief Therapy Feelings Identification    Maretta Los, LCSW

## 2022-07-13 NOTE — Group Note (Signed)
Date:  07/13/2022 Time:  3:34 PM  Group Topic/Focus:  Community Group     Participation Level:  Did Not Attend   Travis Sandoval 07/13/2022, 3:34 PM

## 2022-07-13 NOTE — Progress Notes (Signed)
[facility] MD Progress Note  07/13/2022 1:59 PM Jagjit Klahn  MRN:  [id number] Subjective: Travis Sandoval is seen on rounds.  He has no complaints except for the fact that he is constipated and he asks for something liquid.  Otherwise, nurses report no issues.  No side effects from his medicine. Principal Problem: Schizophrenia ([place]) Diagnosis: Principal Problem:   Schizophrenia ([place]) Active Problems:   Psychosis ([place])  Total Time spent with patient: 15 minutes  Past Psychiatric History: Schizophrenia  Past Medical History:  Past Medical History:  Diagnosis Date   ADHD    Bipolar 1 disorder ([place])    Hepatitis C    Schizoaffective disorder ([place])    History reviewed. No pertinent surgical history. Family History:  Family History  Family history unknown: Yes    Social History:  Social History   Substance and Sexual Activity  Alcohol Use Not Currently     Social History   Substance and Sexual Activity  Drug Use Not Currently    Social History   Socioeconomic History   Marital status: Unknown    Spouse name: Not on file   Number of children: Not on file   Years of education: Not on file   Highest education level: Not on file  Occupational History   Not on file  Tobacco Use   Smoking status: Every Day    Packs/day: 0.25    Years: 15.00    Additional pack years: 0.00    Total pack years: 3.75    Types: Cigarettes   Smokeless tobacco: Not on file  Vaping Use   Vaping Use: Not on file  Substance and Sexual Activity   Alcohol use: Not Currently   Drug use: Not Currently   Sexual activity: Not Currently  Other Topics Concern   Not on file  Social History Narrative   Not on file   Social Determinants of Health   Financial Resource Strain: Not on file  Food Insecurity: Patient Declined (05/18/2022)   Hunger Vital Sign    Worried About Running Out of Food in the Last Year: Patient declined    [place] in the Last Year: Patient declined  Transportation Needs:  Patient Declined (05/18/2022)   [place] - Hydrologist (Medical): Patient declined    Lack of Transportation (Non-Medical): Patient declined  Physical Activity: Not on file  Stress: Not on file  Social Connections: Not on file   Additional Social History:                         Sleep: Good  Appetite:  Good  Current Medications: Current Facility-Administered Medications  Medication Dose Route Frequency Provider Last Rate Last Admin   acetaminophen (TYLENOL) tablet 650 mg  650 mg Oral Q6H PRN [name], NP       alum & mag hydroxide-simeth (MAALOX/MYLANTA) 200-200-20 MG/5ML suspension 30 mL  30 mL Oral Q4H PRN [name], NP   30 mL at 07/10/22 2153   ARIPiprazole ER (ABILIFY MAINTENA) injection 400 mg  400 mg Intramuscular Q28 days [name], [name], MD   400 mg at 06/27/22 1701   cloZAPine (CLOZARIL) tablet 350 mg  350 mg Oral QHS [name], [name], MD   350 mg at 07/12/22 2119   docusate sodium (COLACE) capsule 200 mg  200 mg Oral BID [name], [name], MD   200 mg at 07/12/22 1204   famotidine (PEPCID) tablet 20 mg  20 mg Oral BID [name], [name], MD   20 mg at 07/10/22 1010   hydrOXYzine (ATARAX) tablet 50 mg  50 mg Oral Q6H PRN [name], [name], MD   50 mg at 07/08/22 2100   magnesium citrate solution 1 Bottle  1 Bottle Oral Daily PRN Wynelle Cleveland, RPH   1 Bottle at 07/09/22 1707   magnesium hydroxide (MILK OF MAGNESIA) suspension 30 mL  30 mL Oral Daily PRN Wynelle Cleveland, RPH   30 mL at 07/12/22 1721   mineral oil liquid   Oral Daily PRN Parks Ranger, DO       nicotine polacrilex (NICORETTE) gum 2 mg  2 mg Oral PRN [name], [name], MD   2 mg at 04/23/22 2200   senna-docusate (Senokot-S) tablet 2 tablet  2 tablet Oral Daily PRN [name], [name], MD   2 tablet at 06/22/22 2141   ziprasidone (GEODON) injection 20 mg  20 mg Intramuscular Q12H PRN [name], MD        Lab Results: No results found for this or any  previous visit (from the past 48 hour(s)).  Blood Alcohol level:  Lab Results  Component Value Date   ETH <10 0000000    Metabolic Disorder Labs: Lab Results  Component Value Date   HGBA1C 5.5 04/24/2022   MPG 111 04/24/2022   No results found for: "PROLACTIN" Lab Results  Component Value Date   CHOL 220 (H) 04/24/2022   TRIG 700 (H) 04/24/2022   HDL 35 (L) 04/24/2022   CHOLHDL 6.3 04/24/2022   VLDL UNABLE TO CALCULATE IF TRIGLYCERIDE OVER 400 mg/dL 04/24/2022   LDLCALC UNABLE TO CALCULATE IF TRIGLYCERIDE OVER 400 mg/dL 04/24/2022    Physical Findings: AIMS: Facial and Oral Movements Muscles of Facial Expression: None, normal Lips and Perioral Area: None, normal Jaw: None, normal Tongue: None, normal,Extremity Movements Upper (arms, wrists, hands, fingers): None, normal Lower (legs, knees, ankles, toes): None, normal, Trunk Movements Neck, shoulders, hips: None, normal, Overall Severity Severity of abnormal movements (highest score from questions above): None, normal Incapacitation due to abnormal movements: None, normal Patient's awareness of abnormal movements (rate only patient's report): No Awareness, Dental Status Current problems with teeth and/or dentures?: No Does patient usually wear dentures?: No  CIWA:    COWS:     Musculoskeletal: Strength & Muscle Tone: within normal limits Gait & Station: normal Patient leans: N/A  Psychiatric Specialty Exam:  Presentation  General Appearance:  Disheveled  Eye Contact: Minimal  Speech: Pressured  Speech Volume: Normal  Handedness: Right   Mood and Affect  Mood: Euphoric; Irritable  Affect: Inappropriate   Thought Process  Thought Processes: Disorganized  Descriptions of Associations:Loose  Orientation:Partial  Thought Content:Illogical; Scattered  History of Schizophrenia/Schizoaffective disorder:No data recorded Duration of Psychotic Symptoms:No data recorded Hallucinations:No  data recorded Ideas of Reference:Delusions; Paranoia  Suicidal Thoughts:No data recorded Homicidal Thoughts:No data recorded  Sensorium  Memory: Immediate Poor; Recent Poor; Remote Poor  Judgment: Poor  Insight: Poor   Executive Functions  Concentration: Fair  Attention Span: Fair  Recall: Poor  Fund of Knowledge: Poor  Language: Poor   Psychomotor Activity  Psychomotor Activity:No data recorded  Assets  Assets: Desire for Improvement; Social Support   Sleep  Sleep:No data recorded    Blood pressure 111/79, pulse (!) 115, temperature 98.1 F (36.7 C), temperature source Oral, resp. rate 17, height 5\' 9"  (1.753 m), weight 96.4 kg, SpO2 97 %. Body mass index is 31.38 kg/m.  Treatment Plan Summary: Daily contact with patient to assess and evaluate symptoms and progress in treatment, Medication management, and Plan continue current medications.  Parks Ranger, DO 07/13/2022, 1:59 PM

## 2022-07-14 DIAGNOSIS — F200 Paranoid schizophrenia: Secondary | ICD-10-CM | POA: Diagnosis not present

## 2022-07-14 NOTE — [facility] Group Notes (Signed)
Orientation/Goals Group: Did not attend.

## 2022-07-14 NOTE — Progress Notes (Signed)
D: Patient alert and oriented x 3, affect is flat and thoughts are organized, he denies SI/HI/AVH. Patient is pleasant and cooperative. Patient appears less anxious and he is interacting with peers and staff appropriately.  A: Patient was offered support and encouragement. He was given scheduled medications and encouraged to attend groups.  15 minute checks were done for safety.  R: Patient attends groups and interacts with peers and staff, he is complaint with medication this evening. Patient has no complaints and he is receptive to treatment and safety maintained on unit.

## 2022-07-14 NOTE — Psychosocial Assessment (Signed)
[place] Group Notes:  (Nursing/MHT/Case Management/Adjunct)  Date:  07/14/2022  Time:  12:46 AM  Type of Therapy:  Group Therapy  Participation Level:  Active  Participation Quality:  Attentive  Affect:  Blunted  Cognitive:  Appropriate  Insight:  Good  Engagement in Group:  Engaged  Modes of Intervention:  Activity  Summary of Progress/Problems:

## 2022-07-14 NOTE — Progress Notes (Signed)
Patient presents with depressed mood, affect congruent. Travis Sandoval reports continued concerns of constipation and requests prn medication for bowel regiment. Patient reports his last bowel movement was 3 days prior but is a poor historian , as when patient is asked if he is sure of this date and he states '' I don't know, I'm not sure. ''  Pt was given prn and encouraged po hydration. Pt denies any SI HI or AV Hallucinations.  Pt is malodorous and has to be prompted to shower repeatedly. Has been isolative to his room throughout shift and not attending programming. Pt is eating and drinking well. MD aware of pt complaints of constipation. Pt appears to be very fixated on medications for his bowels. Pt is safe, will con't to monitor.

## 2022-07-14 NOTE — [facility] Group Notes (Signed)
[place] Group Notes:  (Nursing/MHT/Case Management/Adjunct)  Date:  07/14/2022  Time:  11:38 PM  Type of Therapy:  Group Therapy  Participation Level:  Did Not Attend    Maglione,Vianney Kopecky E 07/14/2022, 11:38 PM

## 2022-07-14 NOTE — Progress Notes (Signed)
[facility] MD Progress Note  07/14/2022 12:22 PM Travis Sandoval  MRN:  [id number] Subjective: [name] is seen on rounds.  He has been isolative to his room.  He has no complaints except for constipation.  He has been pleasant cooperative and compliant.  No side effects from his medications.  No complaints.  Nurses report no issues. Principal Problem: Schizophrenia ([place]) Diagnosis: Principal Problem:   Schizophrenia ([place]) Active Problems:   Psychosis ([place])  Total Time spent with patient: 15 minutes  Past Psychiatric History: Schizophrenia  Past Medical History:  Past Medical History:  Diagnosis Date   ADHD    Bipolar 1 disorder ([place])    Hepatitis C    Schizoaffective disorder ([place])    History reviewed. No pertinent surgical history. Family History:  Family History  Family history unknown: Yes    Social History:  Social History   Substance and Sexual Activity  Alcohol Use Not Currently     Social History   Substance and Sexual Activity  Drug Use Not Currently    Social History   Socioeconomic History   Marital status: Unknown    Spouse name: Not on file   Number of children: Not on file   Years of education: Not on file   Highest education level: Not on file  Occupational History   Not on file  Tobacco Use   Smoking status: Every Day    Packs/day: 0.25    Years: 15.00    Additional pack years: 0.00    Total pack years: 3.75    Types: Cigarettes   Smokeless tobacco: Not on file  Vaping Use   Vaping Use: Not on file  Substance and Sexual Activity   Alcohol use: Not Currently   Drug use: Not Currently   Sexual activity: Not Currently  Other Topics Concern   Not on file  Social History Narrative   Not on file   Social Determinants of Health   Financial Resource Strain: Not on file  Food Insecurity: Patient Declined (05/18/2022)   Hunger Vital Sign    Worried About Running Out of Food in the Last Year: Patient declined    [place] in the Last Year:  Patient declined  Transportation Needs: Patient Declined (05/18/2022)   [place] - Hydrologist (Medical): Patient declined    Lack of Transportation (Non-Medical): Patient declined  Physical Activity: Not on file  Stress: Not on file  Social Connections: Not on file   Additional Social History:                         Sleep: Good  Appetite:  Good  Current Medications: Current Facility-Administered Medications  Medication Dose Route Frequency Provider Last Rate Last Admin   acetaminophen (TYLENOL) tablet 650 mg  650 mg Oral Q6H PRN [name], NP       alum & mag hydroxide-simeth (MAALOX/MYLANTA) 200-200-20 MG/5ML suspension 30 mL  30 mL Oral Q4H PRN [name], NP   30 mL at 07/10/22 2153   ARIPiprazole ER (ABILIFY MAINTENA) injection 400 mg  400 mg Intramuscular Q28 days [name], [name], MD   400 mg at 06/27/22 1701   cloZAPine (CLOZARIL) tablet 350 mg  350 mg Oral QHS [name], [name], MD   350 mg at 07/13/22 2130   docusate sodium (COLACE) capsule 200 mg  200 mg Oral BID [name], [name], MD   200 mg at 07/14/22 [id number]  famotidine (PEPCID) tablet 20 mg  20 mg Oral BID [name], [name], MD   20 mg at 07/10/22 1010   hydrOXYzine (ATARAX) tablet 50 mg  50 mg Oral Q6H PRN [name], [name], MD   50 mg at 07/08/22 2100   magnesium citrate solution 1 Bottle  1 Bottle Oral Daily PRN Wynelle Cleveland, RPH   1 Bottle at 07/09/22 1707   magnesium hydroxide (MILK OF MAGNESIA) suspension 30 mL  30 mL Oral Daily PRN Wynelle Cleveland, RPH   30 mL at 07/14/22 [id number]   mineral oil liquid   Oral Daily PRN Parks Ranger, DO       nicotine polacrilex (NICORETTE) gum 2 mg  2 mg Oral PRN [name], [name], MD   2 mg at 04/23/22 2200   senna-docusate (Senokot-S) tablet 2 tablet  2 tablet Oral Daily PRN [name], [name], MD   2 tablet at 07/14/22 0809   ziprasidone (GEODON) injection 20 mg  20 mg Intramuscular Q12H PRN [name], MD        Lab  Results: No results found for this or any previous visit (from the past 44 hour(s)).  Blood Alcohol level:  Lab Results  Component Value Date   ETH <10 0000000    Metabolic Disorder Labs: Lab Results  Component Value Date   HGBA1C 5.5 04/24/2022   MPG 111 04/24/2022   No results found for: "PROLACTIN" Lab Results  Component Value Date   CHOL 220 (H) 04/24/2022   TRIG 700 (H) 04/24/2022   HDL 35 (L) 04/24/2022   CHOLHDL 6.3 04/24/2022   VLDL UNABLE TO CALCULATE IF TRIGLYCERIDE OVER 400 mg/dL 04/24/2022   LDLCALC UNABLE TO CALCULATE IF TRIGLYCERIDE OVER 400 mg/dL 04/24/2022    Physical Findings: AIMS: Facial and Oral Movements Muscles of Facial Expression: None, normal Lips and Perioral Area: None, normal Jaw: None, normal Tongue: None, normal,Extremity Movements Upper (arms, wrists, hands, fingers): None, normal Lower (legs, knees, ankles, toes): None, normal, Trunk Movements Neck, shoulders, hips: None, normal, Overall Severity Severity of abnormal movements (highest score from questions above): None, normal Incapacitation due to abnormal movements: None, normal Patient's awareness of abnormal movements (rate only patient's report): No Awareness, Dental Status Current problems with teeth and/or dentures?: No Does patient usually wear dentures?: No  CIWA:    COWS:     Musculoskeletal: Strength & Muscle Tone: within normal limits Gait & Station: normal Patient leans: N/A  Psychiatric Specialty Exam:  Presentation  General Appearance:  Disheveled  Eye Contact: Minimal  Speech: Pressured  Speech Volume: Normal  Handedness: Right   Mood and Affect  Mood: Euphoric; Irritable  Affect: Inappropriate   Thought Process  Thought Processes: Disorganized  Descriptions of Associations:Loose  Orientation:Partial  Thought Content:Illogical; Scattered  History of Schizophrenia/Schizoaffective disorder:No data recorded Duration of Psychotic  Symptoms:No data recorded Hallucinations:No data recorded Ideas of Reference:Delusions; Paranoia  Suicidal Thoughts:No data recorded Homicidal Thoughts:No data recorded  Sensorium  Memory: Immediate Poor; Recent Poor; Remote Poor  Judgment: Poor  Insight: Poor   Executive Functions  Concentration: Fair  Attention Span: Fair  Recall: Poor  Fund of Knowledge: Poor  Language: Poor   Psychomotor Activity  Psychomotor Activity:No data recorded  Assets  Assets: Desire for Improvement; Social Support   Sleep  Sleep:No data recorded   Blood pressure 116/81, pulse (!) 105, temperature 97.8 F (36.6 C), temperature source Oral, resp. rate 18, height 5\' 9"  (1.753 m), weight 96.4 kg, SpO2 97 %. Body  mass index is 31.38 kg/m.   Treatment Plan Summary: Daily contact with patient to assess and evaluate symptoms and progress in treatment, Medication management, and Plan continue current medications.  Parks Ranger, DO 07/14/2022, 12:22 PM

## 2022-07-14 NOTE — B[facility] Group Notes (Signed)
Psychoeducational Group- Patient attended Psychoeducational group on identifying negative behavioral patterns and recognizing symptoms of anxiety.  Poem by psychotherapist Donzetta Kohut '' The owl and the chimpanzee '' was read to help patients identify signs of anxiety, and reflection was used during group. Patient did not attend.

## 2022-07-14 NOTE — Group Note (Signed)
Date:  07/14/2022 Time:  3:31 PM  Group Topic/Focus:  Activity Group-Courtyard    Participation Level:  Did Not Attend   Adela Lank [facility] 07/14/2022, 3:31 PM

## 2022-07-15 DIAGNOSIS — F203 Undifferentiated schizophrenia: Secondary | ICD-10-CM | POA: Diagnosis not present

## 2022-07-15 NOTE — [facility] Group Notes (Signed)
[facility] Group Notes:  (Nursing/MHT/Case Management/Adjunct)  Date:  07/15/2022  Time:  10:31 PM  Type of Therapy:  Group Therapy  Participation Level:  Minimal  Participation Quality:  Attentive and Drowsy  Affect:  Appropriate and Flat  Cognitive:  Alert and Appropriate  Insight:  Appropriate  Engagement in Group:  Engaged  Modes of Intervention:  Discussion and Problem-solving  Summary of Progress/Problems:Wrap up group about the importance of wellness daily checklist in your life.   Maglione,Harlin Mazzoni E 07/15/2022, 10:31 PM

## 2022-07-15 NOTE — Progress Notes (Signed)
[facility] MD Progress Note  07/15/2022 4:17 PM Travis Sandoval  MRN:  [id number] Subjective: Patient seen for follow-up.  No change in presentation no change in complaints.  Sits up in bed makes eye contact and answers questions appropriately but denies any complaints or wishes. Principal Problem: Schizophrenia ([place]) Diagnosis: Principal Problem:   Schizophrenia ([place]) Active Problems:   Psychosis ([place])  Total Time spent with patient: 30 minutes  Past Psychiatric History: Past history of schizophrenia  Past Medical History:  Past Medical History:  Diagnosis Date   ADHD    Bipolar 1 disorder ([place])    Hepatitis C    Schizoaffective disorder ([place])    History reviewed. No pertinent surgical history. Family History:  Family History  Family history unknown: Yes   Family Psychiatric  History: See previous Social History:  Social History   Substance and Sexual Activity  Alcohol Use Not Currently     Social History   Substance and Sexual Activity  Drug Use Not Currently    Social History   Socioeconomic History   Marital status: Unknown    Spouse name: Not on file   Number of children: Not on file   Years of education: Not on file   Highest education level: Not on file  Occupational History   Not on file  Tobacco Use   Smoking status: Every Day    Packs/day: 0.25    Years: 15.00    Additional pack years: 0.00    Total pack years: 3.75    Types: Cigarettes   Smokeless tobacco: Not on file  Vaping Use   Vaping Use: Not on file  Substance and Sexual Activity   Alcohol use: Not Currently   Drug use: Not Currently   Sexual activity: Not Currently  Other Topics Concern   Not on file  Social History Narrative   Not on file   Social Determinants of Health   Financial Resource Strain: Not on file  Food Insecurity: Patient Declined (05/18/2022)   Hunger Vital Sign    Worried About Running Out of Food in the Last Year: Patient declined    [place] in the Last Year:  Patient declined  Transportation Needs: Patient Declined (05/18/2022)   [place] - Hydrologist (Medical): Patient declined    Lack of Transportation (Non-Medical): Patient declined  Physical Activity: Not on file  Stress: Not on file  Social Connections: Not on file   Additional Social History:                         Sleep: Fair  Appetite:  Fair  Current Medications: Current Facility-Administered Medications  Medication Dose Route Frequency Provider Last Rate Last Admin   acetaminophen (TYLENOL) tablet 650 mg  650 mg Oral Q6H PRN [name], NP       alum & mag hydroxide-simeth (MAALOX/MYLANTA) 200-200-20 MG/5ML suspension 30 mL  30 mL Oral Q4H PRN [name], NP   30 mL at 07/10/22 2153   ARIPiprazole ER (ABILIFY MAINTENA) injection 400 mg  400 mg Intramuscular Q28 days Breeonna Mone T, MD   400 mg at 06/27/22 1701   cloZAPine (CLOZARIL) tablet 350 mg  350 mg Oral QHS Daylyn Azbill T, MD   350 mg at 07/14/22 2136   docusate sodium (COLACE) capsule 200 mg  200 mg Oral BID [place]elbert Sevin T, MD   200 mg at 07/15/22 0820   famotidine (PEPCID) tablet  20 mg  20 mg Oral BID Keymon Mcelroy T, MD   20 mg at 07/10/22 1010   hydrOXYzine (ATARAX) tablet 50 mg  50 mg Oral Q6H PRN Tyree Vandruff, [name], MD   50 mg at 07/08/22 2100   magnesium citrate solution 1 Bottle  1 Bottle Oral Daily PRN Wynelle Cleveland, RPH   1 Bottle at 07/09/22 1707   magnesium hydroxide (MILK OF MAGNESIA) suspension 30 mL  30 mL Oral Daily PRN Wynelle Cleveland, RPH   30 mL at 07/14/22 2136   mineral oil liquid   Oral Daily PRN Parks Ranger, DO       nicotine polacrilex (NICORETTE) gum 2 mg  2 mg Oral PRN Naje Rice, [name], MD   2 mg at 04/23/22 2200   senna-docusate (Senokot-S) tablet 2 tablet  2 tablet Oral Daily PRN Lucresha Dismuke, [name], MD   2 tablet at 07/14/22 0809   ziprasidone (GEODON) injection 20 mg  20 mg Intramuscular Q12H PRN [name], MD        Lab  Results: No results found for this or any previous visit (from the past 34 hour(s)).  Blood Alcohol level:  Lab Results  Component Value Date   ETH <10 0000000    Metabolic Disorder Labs: Lab Results  Component Value Date   HGBA1C 5.5 04/24/2022   MPG 111 04/24/2022   No results found for: "PROLACTIN" Lab Results  Component Value Date   CHOL 220 (H) 04/24/2022   TRIG 700 (H) 04/24/2022   HDL 35 (L) 04/24/2022   CHOLHDL 6.3 04/24/2022   VLDL UNABLE TO CALCULATE IF TRIGLYCERIDE OVER 400 mg/dL 04/24/2022   LDLCALC UNABLE TO CALCULATE IF TRIGLYCERIDE OVER 400 mg/dL 04/24/2022    Physical Findings: AIMS: Facial and Oral Movements Muscles of Facial Expression: None, normal Lips and Perioral Area: None, normal Jaw: None, normal Tongue: None, normal,Extremity Movements Upper (arms, wrists, hands, fingers): None, normal Lower (legs, knees, ankles, toes): None, normal, Trunk Movements Neck, shoulders, hips: None, normal, Overall Severity Severity of abnormal movements (highest score from questions above): None, normal Incapacitation due to abnormal movements: None, normal Patient's awareness of abnormal movements (rate only patient's report): No Awareness, Dental Status Current problems with teeth and/or dentures?: No Does patient usually wear dentures?: No  CIWA:    COWS:     Musculoskeletal: Strength & Muscle Tone: within normal limits Gait & Station: normal Patient leans: N/A  Psychiatric Specialty Exam:  Presentation  General Appearance:  Disheveled  Eye Contact: Minimal  Speech: Pressured  Speech Volume: Normal  Handedness: Right   Mood and Affect  Mood: Euphoric; Irritable  Affect: Inappropriate   Thought Process  Thought Processes: Disorganized  Descriptions of Associations:Loose  Orientation:Partial  Thought Content:Illogical; Scattered  History of Schizophrenia/Schizoaffective disorder:No data recorded Duration of Psychotic  Symptoms:No data recorded Hallucinations:No data recorded Ideas of Reference:Delusions; Paranoia  Suicidal Thoughts:No data recorded Homicidal Thoughts:No data recorded  Sensorium  Memory: Immediate Poor; Recent Poor; Remote Poor  Judgment: Poor  Insight: Poor   Executive Functions  Concentration: Fair  Attention Span: Fair  Recall: Poor  Fund of Knowledge: Poor  Language: Poor   Psychomotor Activity  Psychomotor Activity:No data recorded  Assets  Assets: Desire for Improvement; Social Support   Sleep  Sleep:No data recorded   Physical Exam: Physical Exam Vitals and nursing note reviewed.  Constitutional:      Appearance: Normal appearance.  HENT:     Head: Normocephalic and atraumatic.  Mouth/Throat:     Pharynx: Oropharynx is clear.  Eyes:     Pupils: Pupils are equal, round, and reactive to light.  Cardiovascular:     Rate and Rhythm: Normal rate and regular rhythm.  Pulmonary:     Effort: Pulmonary effort is normal.     Breath sounds: Normal breath sounds.  Abdominal:     General: Abdomen is flat.     Palpations: Abdomen is soft.  Musculoskeletal:        General: Normal range of motion.  Skin:    General: Skin is warm and dry.  Neurological:     General: No focal deficit present.     Mental Status: He is alert. Mental status is at baseline.  Psychiatric:        Attention and Perception: Attention normal.        Mood and Affect: Mood normal. Affect is blunt.        Speech: Speech is delayed.        Behavior: Behavior is slowed.        Thought Content: Thought content normal.    Review of Systems  Constitutional: Negative.   HENT: Negative.    Eyes: Negative.   Respiratory: Negative.    Cardiovascular: Negative.   Gastrointestinal: Negative.   Musculoskeletal: Negative.   Skin: Negative.   Neurological: Negative.   Psychiatric/Behavioral: Negative.     Blood pressure 105/64, pulse 94, temperature 97.9 F (36.6 C),  temperature source Oral, resp. rate (!) 21, height 5\' 9"  (1.753 m), weight 96.4 kg, SpO2 96 %. Body mass index is 31.38 kg/m.   Treatment Plan Summary: Plan no change to medication.  Treatment team continues to work on placement issues  Alethia Berthold, MD 07/15/2022, 4:17 PM

## 2022-07-15 NOTE — Progress Notes (Addendum)
D: Patient alert and oriented x 3, affect is flat but he brightens upon approach.patient's thoughts are organized and coherent, he denies SI/HI/AVH. Patient is pleasant and cooperative. Patient appears less anxious, but noted preoccupied with bowel regularity every time the writer spoke with him. Patient is interacting with peers and staff appropriately.  A: Patient was offered support and encouragement. He was given scheduled medications and encouraged to attend groups.  15 minute checks were done for safety.  R: Patient attends groups and interacts with peers and staff appropriately, he is  also complaint with medication. Patient has no complaints and he is receptive to treatment,he was offered emotional support and safety maintained on unit.

## 2022-07-15 NOTE — Progress Notes (Signed)
Patient denies SI, HI, and AVH. He interacts minimally with others. He isolates to his room, coming out only for meals and medications. He is preoccupied with his bowel movements. He took Colace this morning, but refused the Pepcid. Patient remains safe on the unit at this time.

## 2022-07-15 NOTE — Progress Notes (Signed)
Patient calm and pleasant during assessment denying SI/HI/AVH. Pt compliant with medication administration per MD orders. Pt given education, support, and encouragement to be active in his treatment plan. Pt being monitored Q 15 minutes for safety per unit protocol, remains safe on the unit

## 2022-07-15 NOTE — Group Note (Signed)
[facility] LCSW Group Therapy Note    Group Date: 07/15/2022 Start Time: 1300 End Time: 1400  Type of Therapy and Topic:  Group Therapy:  Overcoming Obstacles  Participation Level:  [facility] PARTICIPATION LEVEL: Did Not Attend   Description of Group:   In this group patients will be encouraged to explore what they see as obstacles to their own wellness and recovery. They will be guided to discuss their thoughts, feelings, and behaviors related to these obstacles. The group will process together ways to cope with barriers, with attention given to specific choices patients can make. Each patient will be challenged to identify changes they are motivated to make in order to overcome their obstacles. This group will be process-oriented, with patients participating in exploration of their own experiences as well as giving and receiving support and challenge from other group members.  Therapeutic Goals: 1. Patient will identify personal and current obstacles as they relate to admission. 2. Patient will identify barriers that currently interfere with their wellness or overcoming obstacles.  3. Patient will identify feelings, thought process and behaviors related to these barriers. 4. Patient will identify two changes they are willing to make to overcome these obstacles:    Summary of Patient Progress X   Therapeutic Modalities:   Cognitive Behavioral Therapy Solution Focused Therapy Motivational Interviewing Relapse Prevention Therapy   Shirl Harris, LCSW

## 2022-07-15 NOTE — Group Note (Signed)
Date:  07/15/2022 Time:  9:38 AM  Group Topic/Focus:  Goals Group:   The focus of this group is to help patients establish daily goals to achieve during treatment and discuss how the patient can incorporate goal setting into their daily lives to aide in recovery.  Community Meeting   Participation Level:  Did Not Attend   Adela Lank [facility] 07/15/2022, 9:38 AM

## 2022-07-15 NOTE — Group Note (Signed)
Recreation Therapy Group Note   Group Topic:Emotion Expression  Group Date: 07/15/2022 Start Time: 1000 End Time: 1045 Facilitators: Vilma Prader, LRT, CTRS Location:  Craft Room  Group Description: Gratitude Journaling. Patients and LRT discussed what gratitude means, how we can express it and what it means to [place], personally. LRT gave an education handout on the definition of gratitude that also gave different examples of gratitude exercises that they could try. One of the examples was "Gratitude Letter", which prompted you to write a letter to someone you appreciate. LRT played soft music while everyone wrote their letter. Once letter was completed, LRT encouraged people to read their letter, if they wanted to, or share who they wrote it to, at minimum. LRT and pts processed how showing gratitude towards themselves, and others can be applied to everyday life post-discharge.   Affect/Mood: N/A   Participation Level: Did not attend    Clinical Observations/Individualized Feedback: Travis Sandoval did not attend group due to resting in his room.  Plan: Continue to engage patient in RT group sessions 2-3x/week.   Vilma Prader, LRT, CTRS 07/15/2022 11:17 AM

## 2022-07-15 NOTE — Group Note (Signed)
Date:  07/15/2022 Time:  4:17 PM  Group Topic/Focus:  Activity Group-Courtyard    Participation Level:  Did Not Attend   Travis Sandoval [facility] 07/15/2022, 4:17 PM

## 2022-07-16 DIAGNOSIS — F203 Undifferentiated schizophrenia: Secondary | ICD-10-CM | POA: Diagnosis not present

## 2022-07-16 LAB — CLOZAPINE (CLOZARIL)
Clozapine Lvl: 1176 ng/mL — ABNORMAL HIGH (ref 350–600)
NorClozapine: 427 ng/mL
Total(Cloz+Norcloz): 1603 ng/mL

## 2022-07-16 NOTE — Progress Notes (Signed)
D- Patient alert and oriented. Patient pleasant and isolated in his room. Patient denies SI, HI, AVH, and pain. Patient complaining of unrelieved constipation. Patient refuses scheduled and PRN medication. Writer encouraged patient to ambulate more and drink more fluids.   A- Scheduled medications administered to patient, per MD orders. Patient refused all of his morning meds and evening Pepcid. Support and encouragement provided.  Routine safety checks conducted every 15 minutes.  No PRN medication needed for anxiety or agitation. Patient informed to notify staff with problems or concerns.  R- No adverse drug reactions noted. Patient contracts for safety at this time. Patient compliant with medications and treatment plan. Patient receptive, calm, and cooperative. Patient did not interact with others on the unit much. Patient in his room except for meal times.  Patient remains safe at this time.   07/16/22 0847  Charting Type  Charting Type Shift assessment  Safety Check Verification  Has the RN verified the 15 minute safety check completion? Yes  Neurological  Neuro (WDL) WDL  Cognition Appropriate attention/concentration;Appropriate judgement;Appropriate safety awareness;Appropriate for developmental age;Follows commands;No memory impairment  Speech Clear  Neuro Symptoms None  HEENT  HEENT (WDL) X  Teeth Poor dental hygiene  Voice Clear  Respiratory  Respiratory (WDL) WDL  Cardiac  Cardiac (WDL) WDL  Vascular  Vascular (WDL) WDL  Integumentary  Integumentary (WDL) WDL  Braden Scale (Ages 8 and up)  Sensory Perceptions 4  Moisture 4  Activity 3  Mobility 4  Nutrition 3  Friction and Shear 3  Braden Scale Score 21  Musculoskeletal  Musculoskeletal (WDL) WDL  Assistive Device None  Gastrointestinal  Gastrointestinal (WDL) X  Last BM Date  07/12/22  GU Assessment  Genitourinary (WDL) WDL  Neurological  Level of Consciousness Alert

## 2022-07-16 NOTE — Plan of Care (Signed)
  Problem: Health Behavior/Discharge Planning: Goal: Ability to manage health-related needs will improve Outcome: Progressing   Problem: Clinical Measurements: Goal: Ability to maintain clinical measurements within normal limits will improve Outcome: Progressing Goal: Diagnostic test results will improve Outcome: Progressing Goal: Respiratory complications will improve Outcome: Progressing Goal: Cardiovascular complication will be avoided Outcome: Progressing   Problem: Activity: Goal: Risk for activity intolerance will decrease Outcome: Progressing   Problem: Nutrition: Goal: Adequate nutrition will be maintained Outcome: Progressing   Problem: Coping: Goal: Level of anxiety will decrease Outcome: Progressing   Problem: Elimination: Goal: Will not experience complications related to urinary retention Outcome: Progressing

## 2022-07-16 NOTE — Progress Notes (Signed)
Pt Clozapine level resulted today and is almost double the recommended level. Per NP, Ysidro Evert, I am going to hold his night time dose of 350mg .

## 2022-07-16 NOTE — BH IP Treatment Plan (Signed)
Interdisciplinary Treatment and Diagnostic Plan Update  07/16/2022 Time of Session: 0830 Darel [place] MRN: [id number]  Principal Diagnosis: Schizophrenia [facility])  Secondary Diagnoses: Principal Problem:   Schizophrenia ([place]) Active Problems:   Psychosis ([place])   Current Medications:  Current Facility-Administered Medications  Medication Dose Route Frequency Provider Last Rate Last Admin   acetaminophen (TYLENOL) tablet 650 mg  650 mg Oral Q6H PRN [name], NP       alum & mag hydroxide-simeth (MAALOX/MYLANTA) 200-200-20 MG/5ML suspension 30 mL  30 mL Oral Q4H PRN [name], NP   30 mL at 07/10/22 2153   ARIPiprazole ER (ABILIFY MAINTENA) injection 400 mg  400 mg Intramuscular Q28 days [name], [name], MD   400 mg at 06/27/22 1701   cloZAPine (CLOZARIL) tablet 350 mg  350 mg Oral QHS [name], [name], MD   350 mg at 07/15/22 2106   docusate sodium (COLACE) capsule 200 mg  200 mg Oral BID [name], [name], MD   200 mg at 07/15/22 1652   famotidine (PEPCID) tablet 20 mg  20 mg Oral BID [name], [name], MD   20 mg at 07/10/22 1010   hydrOXYzine (ATARAX) tablet 50 mg  50 mg Oral Q6H PRN [name], [name], MD   50 mg at 07/08/22 2100   magnesium citrate solution 1 Bottle  1 Bottle Oral Daily PRN Wynelle Cleveland, RPH   1 Bottle at 07/09/22 1707   magnesium hydroxide (MILK OF MAGNESIA) suspension 30 mL  30 mL Oral Daily PRN Wynelle Cleveland, RPH   30 mL at 07/15/22 2109   mineral oil liquid   Oral Daily PRN Parks Ranger, DO       nicotine polacrilex (NICORETTE) gum 2 mg  2 mg Oral PRN [name], [name], MD   2 mg at 04/23/22 2200   senna-docusate (Senokot-S) tablet 2 tablet  2 tablet Oral Daily PRN [name], [name], MD   2 tablet at 07/14/22 0809   ziprasidone (GEODON) injection 20 mg  20 mg Intramuscular Q12H PRN [name], MD       PTA Medications: Medications Prior to Admission  Medication Sig Dispense Refill Last Dose   ABILIFY MAINTENA 400 MG SRER injection  Inject 400 mg into the muscle every 28 (twenty-eight) days.      ARIPiprazole (ABILIFY) 10 MG tablet Take 10 mg by mouth daily. (Patient not taking: Reported on 04/16/2022)      atomoxetine (STRATTERA) 40 MG capsule Take 40 mg by mouth daily. (Patient not taking: Reported on 04/16/2022)      atorvastatin (LIPITOR) 40 MG tablet Take 40 mg by mouth daily.      budesonide-formoterol (SYMBICORT) 160-4.5 MCG/ACT inhaler Inhale 2 puffs into the lungs.      cetirizine (ZYRTEC) 10 MG tablet Take 10 mg by mouth daily.      clozapine (CLOZARIL) 200 MG tablet Take 200 mg by mouth 2 (two) times daily. Take along with one 25 mg tablet for total 225 mg twice daily      cloZAPine (CLOZARIL) 25 MG tablet Take 25 mg by mouth 2 (two) times daily. Take along with one 200 mg tablet for total 225 mg twice daily      Dexlansoprazole (DEXILANT) 30 MG capsule Take 30 mg by mouth daily. (Patient not taking: Reported on 04/16/2022)      hydrOXYzine (VISTARIL) 50 MG capsule Take 50 mg by mouth 3 (three) times daily as needed for itching. (Patient not taking: Reported  on 04/16/2022)      Melatonin 5 MG TABS Take 5 mg by mouth at bedtime as needed (sleep).      metoprolol succinate (TOPROL-XL) 25 MG 24 hr tablet Take 12.5 mg by mouth daily.      Phosphatidyl Choline 65 MG TABS Take 1 tablet by mouth daily.  (Patient not taking: Reported on 04/16/2022)      valproic acid (DEPAKENE) 250 MG/5ML solution Take 750-1,000 mLs by mouth 2 (two) times daily. 1000 mg (20 mL) every morning and 750 mg (15 mL) daily at bedtime       Patient Stressors: Other: Psychosis    Patient Strengths: Ability for insight  Active sense of humor  Average or above average intelligence  Capable of independent living  Supportive family/friends   Treatment Modalities: Medication Management, Group therapy, Case management,  1 to 1 session with clinician, Psychoeducation, Recreational therapy.   Physician Treatment Plan for Primary Diagnosis:  Schizophrenia ([place]) Long Term Goal(s):     Short Term Goals: None, pt is a poor hx.  Medication Management: Evaluate patient's response, side effects, and tolerance of medication regimen.  Therapeutic Interventions: 1 to 1 sessions, Unit Group sessions and Medication administration.  Evaluation of Outcomes: Progressing  Physician Treatment Plan for Secondary Diagnosis: Principal Problem:   Schizophrenia ([place]) Active Problems:   Psychosis ([place])  Long Term Goal(s):     Short Term Goals: None, pt is a poor hx.     Medication Management: Evaluate patient's response, side effects, and tolerance of medication regimen.  Therapeutic Interventions: 1 to 1 sessions, Unit Group sessions and Medication administration.  Evaluation of Outcomes: Progressing   RN Treatment Plan for Primary Diagnosis: Schizophrenia ([place]) Long Term Goal(s): Knowledge of disease and therapeutic regimen to maintain health will improve  Short Term Goals: Ability to remain free from injury will improve, Ability to verbalize frustration and anger appropriately will improve, Ability to demonstrate self-control, Ability to participate in decision making will improve, Ability to verbalize feelings will improve, Ability to disclose and discuss suicidal ideas, Ability to identify and develop effective coping behaviors will improve, and Compliance with prescribed medications will improve  Medication Management: RN will administer medications as ordered by provider, will assess and evaluate patient's response and provide education to patient for prescribed medication. RN will report any adverse and/or side effects to prescribing provider.  Therapeutic Interventions: 1 on 1 counseling sessions, Psychoeducation, Medication administration, Evaluate responses to treatment, Monitor vital signs and CBGs as ordered, Perform/monitor CIWA, COWS, AIMS and Fall Risk screenings as ordered, Perform wound care treatments as  ordered.  Evaluation of Outcomes: Progressing   LCSW Treatment Plan for Primary Diagnosis: Schizophrenia ([place]) Long Term Goal(s): Safe transition to appropriate next level of care at discharge, Engage patient in therapeutic group addressing interpersonal concerns.  Short Term Goals: Engage patient in aftercare planning with referrals and resources, Increase social support, Increase ability to appropriately verbalize feelings, Increase emotional regulation, Facilitate acceptance of mental health diagnosis and concerns, Facilitate patient progression through stages of change regarding substance use diagnoses and concerns, Identify triggers associated with mental health/substance abuse issues, and Increase skills for wellness and recovery  Therapeutic Interventions: Assess for all discharge needs, 1 to 1 time with Social worker, Explore available resources and support systems, Assess for adequacy in community support network, Educate family and significant other(s) on suicide prevention, Complete Psychosocial Assessment, Interpersonal group therapy.  Evaluation of Outcomes: Progressing   Progress in Treatment: Attending groups: No. Participating in groups: No.  Taking medication as prescribed: Yes. Toleration medication: Yes. Family/Significant other contact made: Yes, individual(s) contacted:  Fayette Fieser, father, [phone] Patient understands diagnosis: No. Discussing patient identified problems/goals with staff: Yes. Medical problems stabilized or resolved: Yes. Denies suicidal/homicidal ideation: Yes. Issues/concerns per patient self-inventory: Yes. Other: none  New problem(s) identified: No, Describe:  none  New Short Term/Long Term Goal(s): Patient to work towards  elimination of symptoms of psychosis, medication management for mood stabilization;  development of comprehensive mental wellness plan.  Patient Goals:  No additional goals identified at this time. Patient to continue  to work towards original goals identified in initial treatment team meeting. CSW will remain available to patient should they voice additional treatment goals.   Discharge Plan or Barriers: Patient lacks funding for placement, [employer] rep continues to communicate with social security to restart payment. Patient has guardianship hearing on this day. Situation ongoing, CSW will continue to monitor and update note as more information becomes available.    Reason for Continuation of Hospitalization: Medication stabilization Other; describe psychosis   Estimated Length of Stay: 1-7 days   Scribe for Treatment Team: Larose Kells 07/16/2022 9:44 AM

## 2022-07-16 NOTE — Group Note (Signed)
Recreation Therapy Group Note   Group Topic:Coping Skills  Group Date: 07/16/2022 Start Time: 1000 End Time: 1050 Facilitators: Vilma Prader, LRT, CTRS Location:  Craft Room  Group Description: Mind Map.  Patient was provided a blank template of a diagram with 32 blank boxes in a tiered system, branching from the center (similar to a bubble chart). LRT directed patients to label the middle of the diagram "Coping Skills". LRT and patients then came up with 8 different coping skills as examples. Pt were directed to record their coping skills in the 2nd tier boxes closest to the center.  Patients would then share their coping skills with the group as LRT wrote them out. LRT gave a handout of 100 different coping skills at the end of group.   Affect/Mood: N/A   Participation Level: Did not attend    Clinical Observations/Individualized Feedback: Travis Sandoval did not attend group due to resting in his room.  Plan: Continue to engage patient in RT group sessions 2-3x/week.   Vilma Prader, LRT, CTRS 07/16/2022 11:26 AM

## 2022-07-16 NOTE — Group Note (Signed)
[facility] LCSW Group Therapy Note    Group Date: 07/16/2022 Start Time: 1300 End Time: 1400  Type of Therapy and Topic:  Group Therapy:  Overcoming Obstacles  Participation Level:  [facility] PARTICIPATION LEVEL: Did Not Attend  Mood:  Description of Group:   In this group patients will be encouraged to explore what they see as obstacles to their own wellness and recovery. They will be guided to discuss their thoughts, feelings, and behaviors related to these obstacles. The group will process together ways to cope with barriers, with attention given to specific choices patients can make. Each patient will be challenged to identify changes they are motivated to make in order to overcome their obstacles. This group will be process-oriented, with patients participating in exploration of their own experiences as well as giving and receiving support and challenge from other group members.  Therapeutic Goals: 1. Patient will identify personal and current obstacles as they relate to admission. 2. Patient will identify barriers that currently interfere with their wellness or overcoming obstacles.  3. Patient will identify feelings, thought process and behaviors related to these barriers. 4. Patient will identify two changes they are willing to make to overcome these obstacles:    Summary of Patient Progress Patient did not attend group despite encouraged participation.      Therapeutic Modalities:   Cognitive Behavioral Therapy Solution Focused Therapy Motivational Interviewing Relapse Prevention Therapy   Durenda Hurt, [place]

## 2022-07-16 NOTE — Group Note (Signed)
Date:  07/16/2022 Time:  10:13 AM  Group Topic/Focus:  Goals Group:   The focus of this group is to help patients establish daily goals to achieve during treatment and discuss how the patient can incorporate goal setting into their daily lives to aide in recovery.  Community Meeting / Managing Stress   Participation Level:  Did Not Attend  Travis Sandoval A Travis Sandoval 07/16/2022, 10:13 AM

## 2022-07-16 NOTE — Progress Notes (Signed)
Patient calm and pleasant during assessment denying SI/HI/AVH. Pt compliant with medication administration per MD orders. Pt given education, support, and encouragement to be active in his treatment plan. Pt being monitored Q 15 minutes for safety per unit protocol, remains safe on the unit

## 2022-07-16 NOTE — Progress Notes (Signed)
[facility] MD Progress Note  07/16/2022 3:32 PM Travis Sandoval  MRN:  [id number] Subjective: Follow-up 42 year old man with schizophrenia.  No new complaints.  Behavior stable. Principal Problem: Schizophrenia ([place]) Diagnosis: Principal Problem:   Schizophrenia ([place]) Active Problems:   Psychosis ([place])  Total Time spent with patient: 30 minutes  Past Psychiatric History: Past history of schizophrenia  Past Medical History:  Past Medical History:  Diagnosis Date   ADHD    Bipolar 1 disorder ([place])    Hepatitis C    Schizoaffective disorder ([place])    History reviewed. No pertinent surgical history. Family History:  Family History  Family history unknown: Yes   Family Psychiatric  History: See previous Social History:  Social History   Substance and Sexual Activity  Alcohol Use Not Currently     Social History   Substance and Sexual Activity  Drug Use Not Currently    Social History   Socioeconomic History   Marital status: Unknown    Spouse name: Not on file   Number of children: Not on file   Years of education: Not on file   Highest education level: Not on file  Occupational History   Not on file  Tobacco Use   Smoking status: Every Day    Packs/day: 0.25    Years: 15.00    Additional pack years: 0.00    Total pack years: 3.75    Types: Cigarettes   Smokeless tobacco: Not on file  Vaping Use   Vaping Use: Not on file  Substance and Sexual Activity   Alcohol use: Not Currently   Drug use: Not Currently   Sexual activity: Not Currently  Other Topics Concern   Not on file  Social History Narrative   Not on file   Social Determinants of Health   Financial Resource Strain: Not on file  Food Insecurity: Patient Declined (05/18/2022)   Hunger Vital Sign    Worried About Running Out of Food in the Last Year: Patient declined    [place] in the Last Year: Patient declined  Transportation Needs: Patient Declined (05/18/2022)   [place] - Armed forces logistics/support/administrative officer (Medical): Patient declined    Lack of Transportation (Non-Medical): Patient declined  Physical Activity: Not on file  Stress: Not on file  Social Connections: Not on file   Additional Social History:                         Sleep: Fair  Appetite:  Fair  Current Medications: Current Facility-Administered Medications  Medication Dose Route Frequency Provider Last Rate Last Admin   acetaminophen (TYLENOL) tablet 650 mg  650 mg Oral Q6H PRN [name], NP       alum & mag hydroxide-simeth (MAALOX/MYLANTA) 200-200-20 MG/5ML suspension 30 mL  30 mL Oral Q4H PRN [name], NP   30 mL at 07/10/22 2153   ARIPiprazole ER (ABILIFY MAINTENA) injection 400 mg  400 mg Intramuscular Q28 days Kolleen Ochsner T, MD   400 mg at 06/27/22 1701   cloZAPine (CLOZARIL) tablet 350 mg  350 mg Oral QHS Hampton Cost T, MD   350 mg at 07/15/22 2106   docusate sodium (COLACE) capsule 200 mg  200 mg Oral BID Naseem Varden T, MD   200 mg at 07/15/22 1652   famotidine (PEPCID) tablet 20 mg  20 mg Oral BID Shantil Vallejo, [name], MD   20 mg at 07/10/22 1010  hydrOXYzine (ATARAX) tablet 50 mg  50 mg Oral Q6H PRN Avalene Sealy T, MD   50 mg at 07/08/22 2100   magnesium citrate solution 1 Bottle  1 Bottle Oral Daily PRN Wynelle Cleveland, RPH   1 Bottle at 07/09/22 1707   magnesium hydroxide (MILK OF MAGNESIA) suspension 30 mL  30 mL Oral Daily PRN Wynelle Cleveland, RPH   30 mL at 07/15/22 2109   mineral oil liquid   Oral Daily PRN Parks Ranger, DO       nicotine polacrilex (NICORETTE) gum 2 mg  2 mg Oral PRN Illa Enlow, [name], MD   2 mg at 04/23/22 2200   senna-docusate (Senokot-S) tablet 2 tablet  2 tablet Oral Daily PRN Ricardo Kayes, [name], MD   2 tablet at 07/14/22 0809   ziprasidone (GEODON) injection 20 mg  20 mg Intramuscular Q12H PRN [name], MD        Lab Results: No results found for this or any previous visit (from the past 42 hour(s)).  Blood Alcohol  level:  Lab Results  Component Value Date   ETH <10 0000000    Metabolic Disorder Labs: Lab Results  Component Value Date   HGBA1C 5.5 04/24/2022   MPG 111 04/24/2022   No results found for: "PROLACTIN" Lab Results  Component Value Date   CHOL 220 (H) 04/24/2022   TRIG 700 (H) 04/24/2022   HDL 35 (L) 04/24/2022   CHOLHDL 6.3 04/24/2022   VLDL UNABLE TO CALCULATE IF TRIGLYCERIDE OVER 400 mg/dL 04/24/2022   LDLCALC UNABLE TO CALCULATE IF TRIGLYCERIDE OVER 400 mg/dL 04/24/2022    Physical Findings: AIMS: Facial and Oral Movements Muscles of Facial Expression: None, normal Lips and Perioral Area: None, normal Jaw: None, normal Tongue: None, normal,Extremity Movements Upper (arms, wrists, hands, fingers): None, normal Lower (legs, knees, ankles, toes): None, normal, Trunk Movements Neck, shoulders, hips: None, normal, Overall Severity Severity of abnormal movements (highest score from questions above): None, normal Incapacitation due to abnormal movements: None, normal Patient's awareness of abnormal movements (rate only patient's report): No Awareness, Dental Status Current problems with teeth and/or dentures?: No Does patient usually wear dentures?: No  CIWA:    COWS:     Musculoskeletal: Strength & Muscle Tone: within normal limits Gait & Station: normal Patient leans: N/A  Psychiatric Specialty Exam:  Presentation  General Appearance:  Disheveled  Eye Contact: Minimal  Speech: Pressured  Speech Volume: Normal  Handedness: Right   Mood and Affect  Mood: Euphoric; Irritable  Affect: Inappropriate   Thought Process  Thought Processes: Disorganized  Descriptions of Associations:Loose  Orientation:Partial  Thought Content:Illogical; Scattered  History of Schizophrenia/Schizoaffective disorder:No data recorded Duration of Psychotic Symptoms:No data recorded Hallucinations:No data recorded Ideas of Reference:Delusions;  Paranoia  Suicidal Thoughts:No data recorded Homicidal Thoughts:No data recorded  Sensorium  Memory: Immediate Poor; Recent Poor; Remote Poor  Judgment: Poor  Insight: Poor   Executive Functions  Concentration: Fair  Attention Span: Fair  Recall: Poor  Fund of Knowledge: Poor  Language: Poor   Psychomotor Activity  Psychomotor Activity:No data recorded  Assets  Assets: Desire for Improvement; Social Support   Sleep  Sleep:No data recorded   Physical Exam: Physical Exam Vitals and nursing note reviewed.  Constitutional:      Appearance: Normal appearance.  HENT:     Head: Normocephalic and atraumatic.     Mouth/Throat:     Pharynx: Oropharynx is clear.  Eyes:     Pupils: Pupils are equal,  round, and reactive to light.  Cardiovascular:     Rate and Rhythm: Normal rate and regular rhythm.  Pulmonary:     Effort: Pulmonary effort is normal.     Breath sounds: Normal breath sounds.  Abdominal:     General: Abdomen is flat.     Palpations: Abdomen is soft.  Musculoskeletal:        General: Normal range of motion.  Skin:    General: Skin is warm and dry.  Neurological:     General: No focal deficit present.     Mental Status: He is alert. Mental status is at baseline.  Psychiatric:        Mood and Affect: Mood normal.        Thought Content: Thought content normal.    Review of Systems  Constitutional: Negative.   HENT: Negative.    Eyes: Negative.   Respiratory: Negative.    Cardiovascular: Negative.   Gastrointestinal: Negative.   Musculoskeletal: Negative.   Skin: Negative.   Neurological: Negative.   Psychiatric/Behavioral: Negative.     Blood pressure 123/77, pulse (!) 103, temperature 98 F (36.7 C), temperature source Oral, resp. rate (!) 21, height 5\' 9"  (1.753 m), weight 96.4 kg, SpO2 96 %. Body mass index is 31.38 kg/m.   Treatment Plan Summary: Medication management and Plan no change to medication management.   Tolerating medicine well.  Anticipate that plan is still working on discharge.  Alethia Berthold, MD 07/16/2022, 3:32 PM

## 2022-07-16 NOTE — Group Note (Signed)
Date:  07/16/2022 Time:  9:22 PM  Group Topic/Focus:  Wrap-Up Group:   The focus of this group is to help patients review their daily goal of treatment and discuss progress on daily workbooks.    Participation Level:  Active  Participation Quality:  Appropriate and Attentive  Affect:  Appropriate  Cognitive:  Appropriate  Insight: Good and Improving  Engagement in Group:  Developing/Improving and Engaged  Modes of Intervention:  Clarification  Additional Comments:     Kateri Balch 07/16/2022, 9:22 PM

## 2022-07-17 DIAGNOSIS — F203 Undifferentiated schizophrenia: Secondary | ICD-10-CM | POA: Diagnosis not present

## 2022-07-17 LAB — CBC WITH DIFFERENTIAL/PLATELET
Abs Immature Granulocytes: 0.02 10*3/uL (ref 0.00–0.07)
Basophils Absolute: 0.1 10*3/uL (ref 0.0–0.1)
Basophils Relative: 1 %
Eosinophils Absolute: 0.2 10*3/uL (ref 0.0–0.5)
Eosinophils Relative: 2 %
HCT: 45.9 % (ref 39.0–52.0)
Hemoglobin: 15.3 g/dL (ref 13.0–17.0)
Immature Granulocytes: 0 %
Lymphocytes Relative: 29 %
Lymphs Abs: 2.3 10*3/uL (ref 0.7–4.0)
MCH: 29.5 pg (ref 26.0–34.0)
MCHC: 33.3 g/dL (ref 30.0–36.0)
MCV: 88.6 fL (ref 80.0–100.0)
Monocytes Absolute: 0.7 10*3/uL (ref 0.1–1.0)
Monocytes Relative: 9 %
Neutro Abs: 4.8 10*3/uL (ref 1.7–7.7)
Neutrophils Relative %: 59 %
Platelets: 229 10*3/uL (ref 150–400)
RBC: 5.18 MIL/uL (ref 4.22–5.81)
RDW: 12.7 % (ref 11.5–15.5)
WBC: 8 10*3/uL (ref 4.0–10.5)
nRBC: 0 % (ref 0.0–0.2)

## 2022-07-17 NOTE — Group Note (Signed)
[facility] LCSW Group Therapy Note   Group Date: 07/17/2022 Start Time: 1300 End Time: 1400   Type of Therapy/Topic:  Group Therapy:  Emotion Regulation  Participation Level:  Did Not Attend    Description of Group:    The purpose of this group is to assist patients in learning to regulate negative emotions and experience positive emotions. Patients will be guided to discuss ways in which they have been vulnerable to their negative emotions. These vulnerabilities will be juxtaposed with experiences of positive emotions or situations, and patients challenged to use positive emotions to combat negative ones. Special emphasis will be placed on coping with negative emotions in conflict situations, and patients will process healthy conflict resolution skills.  Therapeutic Goals: Patient will identify two positive emotions or experiences to reflect on in order to balance out negative emotions:  Patient will label two or more emotions that they find the most difficult to experience:  Patient will be able to demonstrate positive conflict resolution skills through discussion or role plays:   Summary of Patient Progress: X   Therapeutic Modalities:   Cognitive Behavioral Therapy Feelings Identification Dialectical Behavioral Therapy   Shirl Harris, LCSW

## 2022-07-17 NOTE — Progress Notes (Signed)
[facility] MD Progress Note  07/17/2022 3:16 PM Travis Sandoval  MRN:  [id number] Subjective: No new complaint.  No change to presentation.  Stays in his room but has no complaints. Principal Problem: Schizophrenia ([place]) Diagnosis: Principal Problem:   Schizophrenia ([place]) Active Problems:   Psychosis ([place])  Total Time spent with patient: 30 minutes  Past Psychiatric History: See previous.  History of schizophrenia.  Past Medical History:  Past Medical History:  Diagnosis Date   ADHD    Bipolar 1 disorder ([place])    Hepatitis C    Schizoaffective disorder ([place])    History reviewed. No pertinent surgical history. Family History:  Family History  Family history unknown: Yes   Family Psychiatric  History: See previous Social History:  Social History   Substance and Sexual Activity  Alcohol Use Not Currently     Social History   Substance and Sexual Activity  Drug Use Not Currently    Social History   Socioeconomic History   Marital status: Unknown    Spouse name: Not on file   Number of children: Not on file   Years of education: Not on file   Highest education level: Not on file  Occupational History   Not on file  Tobacco Use   Smoking status: Every Day    Packs/day: 0.25    Years: 15.00    Additional pack years: 0.00    Total pack years: 3.75    Types: Cigarettes   Smokeless tobacco: Not on file  Vaping Use   Vaping Use: Not on file  Substance and Sexual Activity   Alcohol use: Not Currently   Drug use: Not Currently   Sexual activity: Not Currently  Other Topics Concern   Not on file  Social History Narrative   Not on file   Social Determinants of Health   Financial Resource Strain: Not on file  Food Insecurity: Patient Declined (05/18/2022)   Hunger Vital Sign    Worried About Running Out of Food in the Last Year: Patient declined    [place] in the Last Year: Patient declined  Transportation Needs: Patient Declined (05/18/2022)   [place] -  Hydrologist (Medical): Patient declined    Lack of Transportation (Non-Medical): Patient declined  Physical Activity: Not on file  Stress: Not on file  Social Connections: Not on file   Additional Social History:                         Sleep: Fair  Appetite:  Fair  Current Medications: Current Facility-Administered Medications  Medication Dose Route Frequency Provider Last Rate Last Admin   acetaminophen (TYLENOL) tablet 650 mg  650 mg Oral Q6H PRN [name], NP       alum & mag hydroxide-simeth (MAALOX/MYLANTA) 200-200-20 MG/5ML suspension 30 mL  30 mL Oral Q4H PRN [name], NP   30 mL at 07/10/22 2153   ARIPiprazole ER (ABILIFY MAINTENA) injection 400 mg  400 mg Intramuscular Q28 days Juliannah Ohmann T, MD   400 mg at 06/27/22 1701   cloZAPine (CLOZARIL) tablet 350 mg  350 mg Oral QHS Kaide Gage T, MD   350 mg at 07/15/22 2106   docusate sodium (COLACE) capsule 200 mg  200 mg Oral BID Ceclia Koker T, MD   200 mg at 07/16/22 1648   famotidine (PEPCID) tablet 20 mg  20 mg Oral BID Errin Chewning, [name], MD  20 mg at 07/10/22 1010   hydrOXYzine (ATARAX) tablet 50 mg  50 mg Oral Q6H PRN Curry Dulski, [name], MD   50 mg at 07/08/22 2100   magnesium citrate solution 1 Bottle  1 Bottle Oral Daily PRN Wynelle Cleveland, RPH   1 Bottle at 07/09/22 1707   magnesium hydroxide (MILK OF MAGNESIA) suspension 30 mL  30 mL Oral Daily PRN Wynelle Cleveland, RPH   30 mL at 07/16/22 2103   mineral oil liquid   Oral Daily PRN Parks Ranger, DO   Given at 07/17/22 1201   nicotine polacrilex (NICORETTE) gum 2 mg  2 mg Oral PRN Chrys Landgrebe, [name], MD   2 mg at 04/23/22 2200   senna-docusate (Senokot-S) tablet 2 tablet  2 tablet Oral Daily PRN Jelina Paulsen, [name], MD   2 tablet at 07/14/22 0809   ziprasidone (GEODON) injection 20 mg  20 mg Intramuscular Q12H PRN [name], MD        Lab Results:  Results for orders placed or performed during the  hospital encounter of 04/16/22 (from the past 48 hour(s))  CBC with Differential/Platelet     Status: None   Collection Time: 07/17/22 10:03 AM  Result Value Ref Range   WBC 8.0 4.0 - 10.5 K/uL   RBC 5.18 4.22 - 5.81 MIL/uL   Hemoglobin 15.3 13.0 - 17.0 g/dL   HCT 45.9 39.0 - 52.0 %   MCV 88.6 80.0 - 100.0 fL   MCH 29.5 26.0 - 34.0 pg   MCHC 33.3 30.0 - 36.0 g/dL   RDW 12.7 11.5 - 15.5 %   Platelets 229 150 - 400 K/uL   nRBC 0.0 0.0 - 0.2 %   Neutrophils Relative % 59 %   Neutro Abs 4.8 1.7 - 7.7 K/uL   Lymphocytes Relative 29 %   Lymphs Abs 2.3 0.7 - 4.0 K/uL   Monocytes Relative 9 %   Monocytes Absolute 0.7 0.1 - 1.0 K/uL   Eosinophils Relative 2 %   Eosinophils Absolute 0.2 0.0 - 0.5 K/uL   Basophils Relative 1 %   Basophils Absolute 0.1 0.0 - 0.1 K/uL   Immature Granulocytes 0 %   Abs Immature Granulocytes 0.02 0.00 - 0.07 K/uL    Comment: Performed at [facility], [address] [place], [place] [address]    Blood Alcohol level:  Lab Results  Component Value Date   [facility] <10 0000000    Metabolic Disorder Labs: Lab Results  Component Value Date   HGBA1C 5.5 04/24/2022   MPG 111 04/24/2022   No results found for: "PROLACTIN" Lab Results  Component Value Date   CHOL 220 (H) 04/24/2022   TRIG 700 (H) 04/24/2022   HDL 35 (L) 04/24/2022   CHOLHDL 6.3 04/24/2022   VLDL UNABLE TO CALCULATE IF TRIGLYCERIDE OVER 400 mg/dL 04/24/2022   LDLCALC UNABLE TO CALCULATE IF TRIGLYCERIDE OVER 400 mg/dL 04/24/2022    Physical Findings: AIMS: Facial and Oral Movements Muscles of Facial Expression: None, normal Lips and Perioral Area: None, normal Jaw: None, normal Tongue: None, normal,Extremity Movements Upper (arms, wrists, hands, fingers): None, normal Lower (legs, knees, ankles, toes): None, normal, Trunk Movements Neck, shoulders, hips: None, normal, Overall Severity Severity of abnormal movements (highest score from questions above): None,  normal Incapacitation due to abnormal movements: None, normal Patient's awareness of abnormal movements (rate only patient's report): No Awareness, Dental Status Current problems with teeth and/or dentures?: No Does patient usually wear dentures?: No  CIWA:    COWS:     Musculoskeletal: Strength & Muscle Tone: within normal limits Gait & Station: normal Patient leans: N/A  Psychiatric Specialty Exam:  Presentation  General Appearance:  Disheveled  Eye Contact: Minimal  Speech: Pressured  Speech Volume: Normal  Handedness: Right   Mood and Affect  Mood: Euphoric; Irritable  Affect: Inappropriate   Thought Process  Thought Processes: Disorganized  Descriptions of Associations:Loose  Orientation:Partial  Thought Content:Illogical; Scattered  History of Schizophrenia/Schizoaffective disorder:No data recorded Duration of Psychotic Symptoms:No data recorded Hallucinations:No data recorded Ideas of Reference:Delusions; Paranoia  Suicidal Thoughts:No data recorded Homicidal Thoughts:No data recorded  Sensorium  Memory: Immediate Poor; Recent Poor; Remote Poor  Judgment: Poor  Insight: Poor   Executive Functions  Concentration: Fair  Attention Span: Fair  Recall: Poor  Fund of Knowledge: Poor  Language: Poor   Psychomotor Activity  Psychomotor Activity:No data recorded  Assets  Assets: Desire for Improvement; Social Support   Sleep  Sleep:No data recorded   Physical Exam: Physical Exam Vitals and nursing note reviewed.  Constitutional:      Appearance: Normal appearance.  HENT:     Head: Normocephalic and atraumatic.     Mouth/Throat:     Pharynx: Oropharynx is clear.  Eyes:     Pupils: Pupils are equal, round, and reactive to light.  Cardiovascular:     Rate and Rhythm: Normal rate and regular rhythm.  Pulmonary:     Effort: Pulmonary effort is normal.     Breath sounds: Normal breath sounds.  Abdominal:      General: Abdomen is flat.     Palpations: Abdomen is soft.  Musculoskeletal:        General: Normal range of motion.  Skin:    General: Skin is warm and dry.  Neurological:     General: No focal deficit present.     Mental Status: He is alert. Mental status is at baseline.  Psychiatric:        Attention and Perception: Attention normal.        Mood and Affect: Mood normal. Affect is blunt.        Speech: Speech normal.        Behavior: Behavior is withdrawn. Behavior is cooperative.        Thought Content: Thought content normal.        Cognition and Memory: Cognition normal.    Review of Systems  Constitutional: Negative.   HENT: Negative.    Eyes: Negative.   Respiratory: Negative.    Cardiovascular: Negative.   Gastrointestinal: Negative.   Musculoskeletal: Negative.   Skin: Negative.   Neurological: Negative.   Psychiatric/Behavioral: Negative.     Blood pressure 116/82, pulse (!) 102, temperature 97.9 F (36.6 C), temperature source Oral, resp. rate 18, height 5\' 9"  (1.753 m), weight 96.4 kg, SpO2 97 %. Body mass index is 31.38 kg/m.   Treatment Plan Summary: Medication management and Plan Clozapine level did come back and is elevated.  Patient does not seem to be any more sedated than previously and I suspect the Clozapine level may be inaccurate as there is a great discrepancy between the Clozapine and the nor Clozapine level as well as the lack of any correlation with clinical presentation.  No change to medicine for now I will probably recheck the lab in a few days.  Alethia Berthold, MD 07/17/2022, 3:16 PM

## 2022-07-17 NOTE — Progress Notes (Signed)
Patient calm and pleasant during assessment denying SI/HI/AVH. Pt compliant with medication administration per MD orders. Pt given education, support, and encouragement to be active in his treatment plan. Pt being monitored Q 15 minutes for safety per unit protocol, remains safe on the unit

## 2022-07-17 NOTE — Plan of Care (Signed)
D: Patient alert and oriented. Patient denies pain. Patient denies anxiety and depression. Patient denies SI/HI/AVH. Patient did not eat breakfast this morning due to constipation. Patient did refuse scheduled medication during shift and requested PRN medication to help with constipation.   A: Support and encouragement provided to patient.  Q15 minute safety checks maintained.   R:  No adverse drug reactions noted. Patient remains safe on the unit at this time. Problem: Education: Goal: Knowledge of [place] [facility] Education information/materials will improve Outcome: Progressing Goal: Verbalization of understanding the information provided will improve Outcome: Progressing   Problem: Physical Regulation: Goal: Ability to maintain clinical measurements within normal limits will improve Outcome: Progressing   Problem: Role Relationship: Goal: Ability to communicate needs accurately will improve Outcome: Progressing

## 2022-07-17 NOTE — Group Note (Signed)
Recreation Therapy Group Note   Group Topic:Emotion Expression  Group Date: 07/17/2022 Start Time: 1000 End Time: 1050 Facilitators: Leona Carry, CTRS Location:  Craft Room  Group Description: Painting a Financial risk analyst. Patients and LRT discuss what it means to be "at peace", what it feels like physically and mentally. Pts are given a canvas, paint, and stencils to use and encouraged to paint their idea of a peaceful place. Pts and LRT discuss how they use this in their daily life post discharge. Pts are encouraged to take their canvas home with them, once its dried, as a reminder of their peaceful place whenever they are feeling depressed, anxious, etc.   Affect/Mood: N/A   Participation Level: Did not attend    Clinical Observations/Individualized Feedback: Travis Sandoval did not attend group due to resting in his room.  Plan: Continue to engage patient in RT group sessions 2-3x/week.   Vilma Prader, LRT, CTRS 07/17/2022 11:03 AM

## 2022-07-17 NOTE — Group Note (Signed)
Date:  07/17/2022 Time:  10:15 AM  Group Topic/Focus:  Goals Group:   The focus of this group is to help patients establish daily goals to achieve during treatment and discuss how the patient can incorporate goal setting into their daily lives to aide in recovery.  Community Group / Managing Wrong Interpretations   Participation Level:  Did Not Attend  Travis Sandoval 07/17/2022, 10:15 AM

## 2022-07-17 NOTE — B[facility] Group Notes (Signed)
[facility] Group Notes:  (Nursing/MHT/Case Management/Adjunct)  Date:  07/17/2022  Time:  9:44 PM  Type of Therapy:   Wrap up  Participation Level:  Did Not Attend  Participation Quality:   When came for snack he said he had no goals.  Summary of Progress/Problems:  Nehemiah Settle 07/17/2022, 9:44 PM

## 2022-07-18 DIAGNOSIS — F203 Undifferentiated schizophrenia: Secondary | ICD-10-CM | POA: Diagnosis not present

## 2022-07-18 NOTE — Plan of Care (Signed)
D: Patient alert and oriented. Patient denies pain. Patient denies anxiety and depression. Patient denies SI/HI/AVH. Patient isolative to room during shift with exception to coming out for meals and medication. A: Support and encouragement provided to patient.  Q15 minute safety checks maintained.   R:No adverse drug reactions noted. Patient remains safe on the unit at this time. Problem: Education: Goal: Knowledge of [place] [facility] Education information/materials will improve Outcome: Progressing Goal: Verbalization of understanding the information provided will improve Outcome: Progressing   Problem: Physical Regulation: Goal: Ability to maintain clinical measurements within normal limits will improve Outcome: Progressing

## 2022-07-18 NOTE — Progress Notes (Signed)
BHH/BMU/FBC LCSW Progress Note   07/18/2022    3:40 PM  Travis Sandoval   [id number]   Type of Contact and Topic:  Guardian Contact    CSW observed meeting between patient and newly appointed legal guardian Marina Goodell, [place]. Guardian to being pursuing placement for patient.   Encounter ended without incident.     Signed:  Durenda Hurt, MSW, LCSW, LCAS 07/18/2022 3:40 PM

## 2022-07-18 NOTE — Progress Notes (Signed)
B[facility]/BMU/FBC LCSW Progress Note   07/18/2022    12:27 PM  Travis Sandoval   [id number]   Type of Contact and Topic:  Care Coordination   CSW attmepted to reach newly appointed guardian Marina Goodell, [place]. Left HIPAA compliant voicemail with contact information and callback request. .   CSW spoke with Wilhelmenia Blase w/ Department of Social Security who reports the patient's application will be decisioned on this day (07/18/2022); decision will be made available on 07/19/2022. Encouraged to check with the district office ([place]) for the decision.   Situation ongoing, CSW will continue to monitor and update note as more information becomes available.    Signed:  Durenda Hurt, MSW, LCSW, LCAS 07/18/2022 12:27 PM

## 2022-07-18 NOTE — Group Note (Signed)
Date:  07/18/2022 Time:  10:07 PM  Group Topic/Focus:  Goals Group:   The focus of this group is to help patients establish daily goals to achieve during treatment and discuss how the patient can incorporate goal setting into their daily lives to aide in recovery.    Participation Level:  Active  Participation Quality:  Appropriate  Affect:  Appropriate  Cognitive:  Alert and Appropriate  Insight: Appropriate and Good  Engagement in Group:  Limited  Modes of Intervention:  Discussion  Additional Comments:     Travis Sandoval 07/18/2022, 10:07 PM

## 2022-07-18 NOTE — Progress Notes (Signed)
[facility] MD Progress Note  07/18/2022 2:01 PM Travis Sandoval  MRN:  [id number] Subjective: Patient seen and chart reviewed.  Follow-up 42 year old man with schizophrenia.  Patient no new complaints.  If you engage him in conversation will still reveal some delusions but no behavior problems. Principal Problem: Schizophrenia ([place]) Diagnosis: Principal Problem:   Schizophrenia ([place]) Active Problems:   Psychosis ([place])  Total Time spent with patient: 30 minutes  Past Psychiatric History: Past history of schizophrenia  Past Medical History:  Past Medical History:  Diagnosis Date   ADHD    Bipolar 1 disorder ([place])    Hepatitis C    Schizoaffective disorder ([place])    History reviewed. No pertinent surgical history. Family History:  Family History  Family history unknown: Yes   Family Psychiatric  History: See previous Social History:  Social History   Substance and Sexual Activity  Alcohol Use Not Currently     Social History   Substance and Sexual Activity  Drug Use Not Currently    Social History   Socioeconomic History   Marital status: Unknown    Spouse name: Not on file   Number of children: Not on file   Years of education: Not on file   Highest education level: Not on file  Occupational History   Not on file  Tobacco Use   Smoking status: Every Day    Packs/day: 0.25    Years: 15.00    Additional pack years: 0.00    Total pack years: 3.75    Types: Cigarettes   Smokeless tobacco: Not on file  Vaping Use   Vaping Use: Not on file  Substance and Sexual Activity   Alcohol use: Not Currently   Drug use: Not Currently   Sexual activity: Not Currently  Other Topics Concern   Not on file  Social History Narrative   Not on file   Social Determinants of Health   Financial Resource Strain: Not on file  Food Insecurity: Patient Declined (05/18/2022)   Hunger Vital Sign    Worried About Running Out of Food in the Last Year: Patient declined    [place] in  the Last Year: Patient declined  Transportation Needs: Patient Declined (05/18/2022)   [place] - Hydrologist (Medical): Patient declined    Lack of Transportation (Non-Medical): Patient declined  Physical Activity: Not on file  Stress: Not on file  Social Connections: Not on file   Additional Social History:                         Sleep: Fair  Appetite:  Fair  Current Medications: Current Facility-Administered Medications  Medication Dose Route Frequency Provider Last Rate Last Admin   acetaminophen (TYLENOL) tablet 650 mg  650 mg Oral Q6H PRN [name], NP       alum & mag hydroxide-simeth (MAALOX/MYLANTA) 200-200-20 MG/5ML suspension 30 mL  30 mL Oral Q4H PRN [name], NP   30 mL at 07/10/22 2153   ARIPiprazole ER (ABILIFY MAINTENA) injection 400 mg  400 mg Intramuscular Q28 days Rotunda Worden T, MD   400 mg at 06/27/22 1701   cloZAPine (CLOZARIL) tablet 350 mg  350 mg Oral QHS Kosei Rhodes T, MD   350 mg at 07/17/22 2101   docusate sodium (COLACE) capsule 200 mg  200 mg Oral BID Sinclair Alligood T, MD   200 mg at 07/16/22 1648   famotidine (PEPCID)  tablet 20 mg  20 mg Oral BID Mauricio Dahlen, [name], MD   20 mg at 07/10/22 1010   hydrOXYzine (ATARAX) tablet 50 mg  50 mg Oral Q6H PRN Venissa Nappi, [name], MD   50 mg at 07/08/22 2100   magnesium citrate solution 1 Bottle  1 Bottle Oral Daily PRN Wynelle Cleveland, RPH   1 Bottle at 07/09/22 1707   magnesium hydroxide (MILK OF MAGNESIA) suspension 30 mL  30 mL Oral Daily PRN Wynelle Cleveland, RPH   30 mL at 07/17/22 2101   mineral oil liquid   Oral Daily PRN Parks Ranger, DO   Given at 07/17/22 1201   nicotine polacrilex (NICORETTE) gum 2 mg  2 mg Oral PRN Emmerie Battaglia, [name], MD   2 mg at 04/23/22 2200   senna-docusate (Senokot-S) tablet 2 tablet  2 tablet Oral Daily PRN Jonatha Gagen, [name], MD   2 tablet at 07/14/22 0809   ziprasidone (GEODON) injection 20 mg  20 mg Intramuscular Q12H  PRN [name], MD        Lab Results:  Results for orders placed or performed during the hospital encounter of 04/16/22 (from the past 48 hour(s))  CBC with Differential/Platelet     Status: None   Collection Time: 07/17/22 10:03 AM  Result Value Ref Range   WBC 8.0 4.0 - 10.5 K/uL   RBC 5.18 4.22 - 5.81 MIL/uL   Hemoglobin 15.3 13.0 - 17.0 g/dL   HCT 45.9 39.0 - 52.0 %   MCV 88.6 80.0 - 100.0 fL   MCH 29.5 26.0 - 34.0 pg   MCHC 33.3 30.0 - 36.0 g/dL   RDW 12.7 11.5 - 15.5 %   Platelets 229 150 - 400 K/uL   nRBC 0.0 0.0 - 0.2 %   Neutrophils Relative % 59 %   Neutro Abs 4.8 1.7 - 7.7 K/uL   Lymphocytes Relative 29 %   Lymphs Abs 2.3 0.7 - 4.0 K/uL   Monocytes Relative 9 %   Monocytes Absolute 0.7 0.1 - 1.0 K/uL   Eosinophils Relative 2 %   Eosinophils Absolute 0.2 0.0 - 0.5 K/uL   Basophils Relative 1 %   Basophils Absolute 0.1 0.0 - 0.1 K/uL   Immature Granulocytes 0 %   Abs Immature Granulocytes 0.02 0.00 - 0.07 K/uL    Comment: Performed at [facility], [address] [place], [place] [address]    Blood Alcohol level:  Lab Results  Component Value Date   [facility] <10 0000000    Metabolic Disorder Labs: Lab Results  Component Value Date   HGBA1C 5.5 04/24/2022   MPG 111 04/24/2022   No results found for: "PROLACTIN" Lab Results  Component Value Date   CHOL 220 (H) 04/24/2022   TRIG 700 (H) 04/24/2022   HDL 35 (L) 04/24/2022   CHOLHDL 6.3 04/24/2022   VLDL UNABLE TO CALCULATE IF TRIGLYCERIDE OVER 400 mg/dL 04/24/2022   LDLCALC UNABLE TO CALCULATE IF TRIGLYCERIDE OVER 400 mg/dL 04/24/2022    Physical Findings: AIMS: Facial and Oral Movements Muscles of Facial Expression: None, normal Lips and Perioral Area: None, normal Jaw: None, normal Tongue: None, normal,Extremity Movements Upper (arms, wrists, hands, fingers): None, normal Lower (legs, knees, ankles, toes): None, normal, Trunk Movements Neck, shoulders, hips: None, normal, Overall  Severity Severity of abnormal movements (highest score from questions above): None, normal Incapacitation due to abnormal movements: None, normal Patient's awareness of abnormal movements (rate only patient's report): No Awareness, Dental Status  Current problems with teeth and/or dentures?: No Does patient usually wear dentures?: No  CIWA:    COWS:     Musculoskeletal: Strength & Muscle Tone: within normal limits Gait & Station: normal Patient leans: N/A  Psychiatric Specialty Exam:  Presentation  General Appearance:  Disheveled  Eye Contact: Minimal  Speech: Pressured  Speech Volume: Normal  Handedness: Right   Mood and Affect  Mood: Euphoric; Irritable  Affect: Inappropriate   Thought Process  Thought Processes: Disorganized  Descriptions of Associations:Loose  Orientation:Partial  Thought Content:Illogical; Scattered  History of Schizophrenia/Schizoaffective disorder:No data recorded Duration of Psychotic Symptoms:No data recorded Hallucinations:No data recorded Ideas of Reference:Delusions; Paranoia  Suicidal Thoughts:No data recorded Homicidal Thoughts:No data recorded  Sensorium  Memory: Immediate Poor; Recent Poor; Remote Poor  Judgment: Poor  Insight: Poor   Executive Functions  Concentration: Fair  Attention Span: Fair  Recall: Poor  Fund of Knowledge: Poor  Language: Poor   Psychomotor Activity  Psychomotor Activity:No data recorded  Assets  Assets: Desire for Improvement; Social Support   Sleep  Sleep:No data recorded   Physical Exam: Physical Exam Vitals reviewed.  Constitutional:      Appearance: Normal appearance.  HENT:     Head: Normocephalic and atraumatic.     Mouth/Throat:     Pharynx: Oropharynx is clear.  Eyes:     Pupils: Pupils are equal, round, and reactive to light.  Cardiovascular:     Rate and Rhythm: Normal rate and regular rhythm.  Pulmonary:     Effort: Pulmonary effort is  normal.     Breath sounds: Normal breath sounds.  Abdominal:     General: Abdomen is flat.     Palpations: Abdomen is soft.  Musculoskeletal:        General: Normal range of motion.  Skin:    General: Skin is warm and dry.  Neurological:     General: No focal deficit present.     Mental Status: He is alert. Mental status is at baseline.  Psychiatric:        Mood and Affect: Mood normal.        Thought Content: Thought content normal.    Review of Systems  Constitutional: Negative.   HENT: Negative.    Eyes: Negative.   Respiratory: Negative.    Cardiovascular: Negative.   Gastrointestinal: Negative.   Musculoskeletal: Negative.   Skin: Negative.   Neurological: Negative.   Psychiatric/Behavioral: Negative.     Blood pressure 127/76, pulse (!) 110, temperature 97.7 F (36.5 C), temperature source Oral, resp. rate 18, height 5\' 9"  (1.753 m), weight 96.4 kg, SpO2 97 %. Body mass index is 31.38 kg/m.   Treatment Plan Summary: Medication management and Plan today we learned the patient is going to be assigned a new guardian.  Guardian may be coming by to see him today.  This may be a first step towards reinstating phon so that we can move towards discharge planning  Alethia Berthold, MD 07/18/2022, 2:01 PM

## 2022-07-18 NOTE — Group Note (Signed)
Recreation Therapy Group Note   Group Topic:Problem Solving  Group Date: 07/18/2022 Start Time: 1000 End Time: 1050 Facilitators: Vilma Prader, LRT, CTRS Location:  Craft Room  Group Description: Life Boat. Patients were given the scenario that they are on a boat that is about to become shipwrecked, leaving them stranded on an [place]. They are asked to make a list of 15 different items that they want to take with them when they are stranded on the [place]. Patients are asked to rank their items from most important to least important, #1 being the most important and #15 being the least. Patients will work individually for the first round to come up with 15 items and then pair up with a peer(s) to condense their list and come up with one list of 15 items between the two of them. Patients or LRT will read aloud the 15 different items to the group after each round. LRT facilitated post-activity processing to discuss how this activity can be used in daily life post discharge.   Affect/Mood: N/A   Participation Level: Did not attend    Clinical Observations/Individualized Feedback: Travis Sandoval did not attend group due to resting in his room.  Plan: Continue to engage patient in RT group sessions 2-3x/week.   Vilma Prader, LRT, CTRS 07/18/2022 12:48 PM

## 2022-07-18 NOTE — Group Note (Signed)
LCSW Group Therapy Note  Group Date: 07/18/2022 Start Time: 1300 End Time: 1400   Type of Therapy and Topic:  Group Therapy - Healthy vs Unhealthy Coping Skills  Participation Level:  Did Not Attend   Description of Group The focus of this group was to determine what unhealthy coping techniques typically are used by group members and what healthy coping techniques would be helpful in coping with various problems. Patients were guided in becoming aware of the differences between healthy and unhealthy coping techniques. Patients were asked to identify 2-3 healthy coping skills they would like to learn to use more effectively.  Therapeutic Goals Patients learned that coping is what human beings do all day long to deal with various situations in their lives Patients defined and discussed healthy vs unhealthy coping techniques Patients identified their preferred coping techniques and identified whether these were healthy or unhealthy Patients determined 2-3 healthy coping skills they would like to become more familiar with and use more often. Patients provided support and ideas to each other   Summary of Patient Progress:   Patient did not attend group despite encouraged participation.    Therapeutic Modalities Cognitive Behavioral Therapy Motivational Interviewing  Travis Sandoval 07/18/2022  3:36 PM

## 2022-07-18 NOTE — Progress Notes (Signed)
[facility] LCSW Progress Note   07/18/2022    3:47 PM  Travis Sandoval   [id number]   Type of Contact and Topic:  Guardian Contact     07/18/22 1546  Legal Guardian  Does Patient Have a Court Appointed Legal Guardian? Yes  Legal Guardian Other:  Legal [place], [place], [phone]  Copy of Legal Guardianship Form in [place] Notified of Arrival  Successfully notified  Legal Guardian Notified of Pending Discharge   (NA)    Signed:  Durenda Hurt, MSW, LCSW, LCAS 07/18/2022 3:47 PM

## 2022-07-19 DIAGNOSIS — F203 Undifferentiated schizophrenia: Secondary | ICD-10-CM | POA: Diagnosis not present

## 2022-07-19 MED ORDER — POLYETHYLENE GLYCOL 3350 17 G PO PACK
17.0000 g | PACK | Freq: Every day | ORAL | Status: DC
  Administered 2022-07-20 – 2022-09-12 (×41): 17 g via ORAL
  Filled 2022-07-19 (×50): qty 1

## 2022-07-19 NOTE — Progress Notes (Signed)
[facility] MD Progress Note  07/19/2022 4:23 PM Travis Sandoval  MRN:  [id number] Subjective: Follow-up this patient with schizophrenia.  No change to clinical presentation.  When I came by to see him today he was sitting up on the edge of the bed and was making some odd gestures with his hands which he stopped when I knocked and came in the door but he was not doing anything harmful.  Continues to deny acute symptoms.  He did meet with a new guardian yesterday.  His only complaint is that his constipation has returned. Principal Problem: Schizophrenia Diagnosis: Principal Problem:   Schizophrenia ([place]) Active Problems:   Psychosis ([place])  Total Time spent with patient: 30 minutes  Past Psychiatric History: See previous  Past Medical History:  Past Medical History:  Diagnosis Date   ADHD    Bipolar 1 disorder ([place])    Hepatitis C    Schizoaffective disorder ([place])    History reviewed. No pertinent surgical history. Family History:  Family History  Family history unknown: Yes   Family Psychiatric  History: See previous Social History:  Social History   Substance and Sexual Activity  Alcohol Use Not Currently     Social History   Substance and Sexual Activity  Drug Use Not Currently    Social History   Socioeconomic History   Marital status: Unknown    Spouse name: Not on file   Number of children: Not on file   Years of education: Not on file   Highest education level: Not on file  Occupational History   Not on file  Tobacco Use   Smoking status: Every Day    Packs/day: 0.25    Years: 15.00    Additional pack years: 0.00    Total pack years: 3.75    Types: Cigarettes   Smokeless tobacco: Not on file  Vaping Use   Vaping Use: Not on file  Substance and Sexual Activity   Alcohol use: Not Currently   Drug use: Not Currently   Sexual activity: Not Currently  Other Topics Concern   Not on file  Social History Narrative   Not on file   Social Determinants of Health    Financial Resource Strain: Not on file  Food Insecurity: Patient Declined (05/18/2022)   Hunger Vital Sign    Worried About Running Out of Food in the Last Year: Patient declined    [place] in the Last Year: Patient declined  Transportation Needs: Patient Declined (05/18/2022)   PRAPARE - Hydrologist (Medical): Patient declined    Lack of Transportation (Non-Medical): Patient declined  Physical Activity: Not on file  Stress: Not on file  Social Connections: Not on file   Additional Social History:                         Sleep: Fair  Appetite:  Fair  Current Medications: Current Facility-Administered Medications  Medication Dose Route Frequency Provider Last Rate Last Admin   acetaminophen (TYLENOL) tablet 650 mg  650 mg Oral Q6H PRN [name], NP       alum & mag hydroxide-simeth (MAALOX/MYLANTA) 200-200-20 MG/5ML suspension 30 mL  30 mL Oral Q4H PRN [name], NP   30 mL at 07/10/22 2153   ARIPiprazole ER (ABILIFY MAINTENA) injection 400 mg  400 mg Intramuscular Q28 days Carol Loftin T, MD   400 mg at 06/27/22 1701   cloZAPine (CLOZARIL)  tablet 350 mg  350 mg Oral QHS Rochell Mabie T, MD   350 mg at 07/18/22 2123   docusate sodium (COLACE) capsule 200 mg  200 mg Oral BID Gloyd Happ T, MD   200 mg at 07/19/22 [id number]   famotidine (PEPCID) tablet 20 mg  20 mg Oral BID Timira Bieda T, MD   20 mg at 07/10/22 1010   hydrOXYzine (ATARAX) tablet 50 mg  50 mg Oral Q6H PRN Mykela Mewborn T, MD   50 mg at 07/08/22 2100   magnesium citrate solution 1 Bottle  1 Bottle Oral Daily PRN Wynelle Cleveland, RPH   1 Bottle at 07/09/22 1707   magnesium hydroxide (MILK OF MAGNESIA) suspension 30 mL  30 mL Oral Daily PRN Wynelle Cleveland, RPH   30 mL at 07/18/22 1718   mineral oil liquid   Oral Daily PRN Parks Ranger, DO   Given at 07/17/22 1201   nicotine polacrilex (NICORETTE) gum 2 mg  2 mg Oral PRN Malie Kashani, [name], MD    2 mg at 04/23/22 2200   senna-docusate (Senokot-S) tablet 2 tablet  2 tablet Oral Daily PRN Kenlee Maler, [name], MD   2 tablet at 07/14/22 0809   ziprasidone (GEODON) injection 20 mg  20 mg Intramuscular Q12H PRN [name], MD        Lab Results: No results found for this or any previous visit (from the past 23 hour(s)).  Blood Alcohol level:  Lab Results  Component Value Date   ETH <10 0000000    Metabolic Disorder Labs: Lab Results  Component Value Date   HGBA1C 5.5 04/24/2022   MPG 111 04/24/2022   No results found for: "PROLACTIN" Lab Results  Component Value Date   CHOL 220 (H) 04/24/2022   TRIG 700 (H) 04/24/2022   HDL 35 (L) 04/24/2022   CHOLHDL 6.3 04/24/2022   VLDL UNABLE TO CALCULATE IF TRIGLYCERIDE OVER 400 mg/dL 04/24/2022   LDLCALC UNABLE TO CALCULATE IF TRIGLYCERIDE OVER 400 mg/dL 04/24/2022    Physical Findings: AIMS: Facial and Oral Movements Muscles of Facial Expression: None, normal Lips and Perioral Area: None, normal Jaw: None, normal Tongue: None, normal,Extremity Movements Upper (arms, wrists, hands, fingers): None, normal Lower (legs, knees, ankles, toes): None, normal, Trunk Movements Neck, shoulders, hips: None, normal, Overall Severity Severity of abnormal movements (highest score from questions above): None, normal Incapacitation due to abnormal movements: None, normal Patient's awareness of abnormal movements (rate only patient's report): No Awareness, Dental Status Current problems with teeth and/or dentures?: No Does patient usually wear dentures?: No  CIWA:    COWS:     Musculoskeletal: Strength & Muscle Tone: within normal limits Gait & Station: normal Patient leans: N/A  Psychiatric Specialty Exam:  Presentation  General Appearance:  Disheveled  Eye Contact: Minimal  Speech: Pressured  Speech Volume: Normal  Handedness: Right   Mood and Affect  Mood: Euphoric; Irritable  Affect: Inappropriate   Thought  Process  Thought Processes: Disorganized  Descriptions of Associations:Loose  Orientation:Partial  Thought Content:Illogical; Scattered  History of Schizophrenia/Schizoaffective disorder:No data recorded Duration of Psychotic Symptoms:No data recorded Hallucinations:No data recorded Ideas of Reference:Delusions; Paranoia  Suicidal Thoughts:No data recorded Homicidal Thoughts:No data recorded  Sensorium  Memory: Immediate Poor; Recent Poor; Remote Poor  Judgment: Poor  Insight: Poor   Executive Functions  Concentration: Fair  Attention Span: Fair  Recall: Poor  Fund of Knowledge: Poor  Language: Poor   Psychomotor Activity  Psychomotor Activity:No data recorded  Assets  Assets: Desire for Improvement; Social Support   Sleep  Sleep:No data recorded   Physical Exam: Physical Exam Vitals reviewed.  Constitutional:      Appearance: Normal appearance.  HENT:     Head: Normocephalic and atraumatic.     Mouth/Throat:     Pharynx: Oropharynx is clear.  Eyes:     Pupils: Pupils are equal, round, and reactive to light.  Cardiovascular:     Rate and Rhythm: Normal rate and regular rhythm.  Pulmonary:     Effort: Pulmonary effort is normal.     Breath sounds: Normal breath sounds.  Abdominal:     General: Abdomen is flat.     Palpations: Abdomen is soft.  Musculoskeletal:        General: Normal range of motion.  Skin:    General: Skin is warm and dry.  Neurological:     General: No focal deficit present.     Mental Status: He is alert. Mental status is at baseline.  Psychiatric:        Attention and Perception: Attention normal.        Mood and Affect: Mood normal.        Speech: Speech normal.        Behavior: Behavior normal.        Thought Content: Thought content normal.        Cognition and Memory: Cognition normal.    Review of Systems  Constitutional: Negative.   HENT: Negative.    Eyes: Negative.   Respiratory: Negative.     Cardiovascular: Negative.   Gastrointestinal: Negative.   Musculoskeletal: Negative.   Skin: Negative.   Neurological: Negative.   Psychiatric/Behavioral: Negative.     Blood pressure 111/79, pulse (!) 107, temperature 97.9 F (36.6 C), temperature source Oral, resp. rate 18, height 5\' 9"  (1.753 m), weight 96.4 kg, SpO2 96 %. Body mass index is 31.38 kg/m.   Treatment Plan Summary: Plan I reminded him that he has the magnesium citrate available daily as a as needed and he need only ask for it.  I have reviewed this with nursing as well.  I will make sure he has other options and see if we can make sure that he is getting what we can have for preventing constipation although I believe he has declined the Colace in the past.  Alethia Berthold, MD 07/19/2022, 4:23 PM

## 2022-07-19 NOTE — Group Note (Signed)
Recreation Therapy Group Note   Group Topic:Leisure Education  Group Date: 07/19/2022 Start Time: 1000 End Time: 1055 Facilitators: Vilma Prader, LRT, CTRS Location:  Craft Room  Group Description: Leisure. Patients were given the option to choose from coloring mandalas, playing apples to apples, or making origami duration of session while listening to music. LRT and pts discussed the importance of participating in leisure during their free time and when they're outside of the hospital. Pt identified two leisure interests and shared with the group.   Affect/Mood: N/A   Participation Level: Did not attend    Clinical Observations/Individualized Feedback: Pt did not attend group due to resting in his room.  Plan: Continue to engage patient in RT group sessions 2-3x/week.   Vilma Prader, LRT, CTRS 07/19/2022 11:12 AM

## 2022-07-19 NOTE — Plan of Care (Signed)
D- Patient alert and oriented. Patient presented in a pleasant mood on assessment reporting that he slept "good" last night and had complaints of constipation. Patient feels as if his scheduled Pepcid is causing him to be constipated, so he has started refusing this medication. Patient states that the only medication he feels is working is the PRN Mag. Citrate. Patient denied SI, HI, AVH, and pain at this time. Patient also denied any signs/symptoms of depression and anxiety. Patient had no stated goals for today.  A- Some scheduled medications administered to patient, per MD orders. Support and encouragement provided.  Routine safety checks conducted every 15 minutes.  Patient informed to notify staff with problems or concerns.  R- No adverse drug reactions noted. Patient contracts for safety at this time. Patient compliant with medications. Patient receptive, calm, and cooperative. Patient isolates to room, except for meals and medication. Patient remains safe at this time.  Problem: Education: Goal: Knowledge of General Education information will improve Description: Including pain rating scale, medication(s)/side effects and non-pharmacologic comfort measures Outcome: Progressing   Problem: Health Behavior/Discharge Planning: Goal: Ability to manage health-related needs will improve Outcome: Progressing   Problem: Clinical Measurements: Goal: Ability to maintain clinical measurements within normal limits will improve Outcome: Progressing Goal: Will remain free from infection Outcome: Progressing Goal: Diagnostic test results will improve Outcome: Progressing Goal: Respiratory complications will improve Outcome: Progressing Goal: Cardiovascular complication will be avoided Outcome: Progressing   Problem: Activity: Goal: Risk for activity intolerance will decrease Outcome: Progressing   Problem: Nutrition: Goal: Adequate nutrition will be maintained Outcome: Progressing    Problem: Coping: Goal: Level of anxiety will decrease Outcome: Progressing   Problem: Elimination: Goal: Will not experience complications related to urinary retention Outcome: Progressing   Problem: Pain Managment: Goal: General experience of comfort will improve Outcome: Progressing   Problem: Safety: Goal: Ability to remain free from injury will improve Outcome: Progressing   Problem: Skin Integrity: Goal: Risk for impaired skin integrity will decrease Outcome: Progressing   Problem: Education: Goal: Knowledge of [place] [facility] Education information/materials will improve Outcome: Progressing Goal: Emotional status will improve Outcome: Progressing Goal: Mental status will improve Outcome: Progressing Goal: Verbalization of understanding the information provided will improve Outcome: Progressing   Problem: Activity: Goal: Interest or engagement in activities will improve Outcome: Progressing Goal: Sleeping patterns will improve Outcome: Progressing   Problem: Coping: Goal: Ability to verbalize frustrations and anger appropriately will improve Outcome: Progressing Goal: Ability to demonstrate self-control will improve Outcome: Progressing   Problem: Health Behavior/Discharge Planning: Goal: Identification of resources available to assist in meeting health care needs will improve Outcome: Progressing Goal: Compliance with treatment plan for underlying cause of condition will improve Outcome: Progressing   Problem: Physical Regulation: Goal: Ability to maintain clinical measurements within normal limits will improve Outcome: Progressing   Problem: Safety: Goal: Periods of time without injury will increase Outcome: Progressing   Problem: Activity: Goal: Will verbalize the importance of balancing activity with adequate rest periods Outcome: Progressing   Problem: Education: Goal: Will be free of psychotic symptoms Outcome: Progressing Goal:  Knowledge of the prescribed therapeutic regimen will improve Outcome: Progressing   Problem: Coping: Goal: Coping ability will improve Outcome: Progressing Goal: Will verbalize feelings Outcome: Progressing   Problem: Health Behavior/Discharge Planning: Goal: Compliance with prescribed medication regimen will improve Outcome: Progressing   Problem: Nutritional: Goal: Ability to achieve adequate nutritional intake will improve Outcome: Progressing   Problem: Role Relationship: Goal: Ability to communicate needs accurately  will improve Outcome: Progressing Goal: Ability to interact with others will improve Outcome: Progressing   Problem: Safety: Goal: Ability to redirect hostility and anger into socially appropriate behaviors will improve Outcome: Progressing Goal: Ability to remain free from injury will improve Outcome: Progressing   Problem: Self-Care: Goal: Ability to participate in self-care as condition permits will improve Outcome: Progressing   Problem: Self-Concept: Goal: Will verbalize positive feelings about self Outcome: Progressing

## 2022-07-19 NOTE — [facility] Group Notes (Signed)
[place] Group Notes:  (Nursing/MHT/Case Management/Adjunct)  Date:  07/19/2022  Time:  9:19 PM  Type of Therapy:   Wrap up  Participation Level:  Active  Participation Quality:  Appropriate  Affect:  Appropriate  Cognitive:  Alert  Insight:  Good  Engagement in Group:  Engaged and no goal and always no goal when I ask.  Modes of Intervention:  Support  Summary of Progress/Problems:  Nehemiah Settle 07/19/2022, 9:19 PM

## 2022-07-20 DIAGNOSIS — F203 Undifferentiated schizophrenia: Secondary | ICD-10-CM | POA: Diagnosis not present

## 2022-07-20 NOTE — Plan of Care (Signed)
D- Patient alert and oriented. Pt states he's okay. Isolates in bedroom except form meds/meals.  Refused 0600 vital signs. Denies SI, HI, AVH, and pain.   A- Scheduled medications administered to patient, per MD orders. Support and encouragement provided.  Routine safety checks conducted every 15 minutes.  Patient informed to notify staff with problems or concerns.  R- No adverse drug reactions noted. Patient contracts for safety at this time. Patient compliant with medications and treatment plan. Patient receptive, calm, and cooperative. Patient interacts well with others on the unit.  Patient remains safe at this time.   Problem: Education: Goal: Knowledge of General Education information will improve Description: Including pain rating scale, medication(s)/side effects and non-pharmacologic comfort measures Outcome: Not Progressing   Problem: Health Behavior/Discharge Planning: Goal: Ability to manage health-related needs will improve Outcome: Not Progressing   Problem: Clinical Measurements: Goal: Ability to maintain clinical measurements within normal limits will improve Outcome: Not Progressing Goal: Will remain free from infection Outcome: Not Progressing Goal: Diagnostic test results will improve Outcome: Not Progressing Goal: Respiratory complications will improve Outcome: Not Progressing Goal: Cardiovascular complication will be avoided Outcome: Not Progressing   Problem: Activity: Goal: Risk for activity intolerance will decrease Outcome: Not Progressing   Problem: Nutrition: Goal: Adequate nutrition will be maintained Outcome: Not Progressing   Problem: Coping: Goal: Level of anxiety will decrease Outcome: Not Progressing   Problem: Elimination: Goal: Will not experience complications related to urinary retention Outcome: Not Progressing   Problem: Pain Managment: Goal: General experience of comfort will improve Outcome: Not Progressing   Problem:  Safety: Goal: Ability to remain free from injury will improve Outcome: Not Progressing   Problem: Skin Integrity: Goal: Risk for impaired skin integrity will decrease Outcome: Not Progressing   Problem: Education: Goal: Knowledge of [place] [facility] Education information/materials will improve Outcome: Not Progressing

## 2022-07-20 NOTE — Plan of Care (Signed)
D: Patient alert and oriented. Patient denies pain. Patient denies anxiety and depression. Patient denies SI/HI/AVH. Patient remains isolative to room during shift with exception to coming out for meals and medication.  A: Scheduled medications administered to patient, per MD orders.  Support and encouragement provided to patient.  Q15 minute safety checks maintained.   R: Patient compliant with medication administration and treatment plan. No adverse drug reactions noted. Patient remains safe on the unit at this time. Problem: Education: Goal: Knowledge of General Education information will improve Description: Including pain rating scale, medication(s)/side effects and non-pharmacologic comfort measures Outcome: Progressing   Problem: Clinical Measurements: Goal: Ability to maintain clinical measurements within normal limits will improve Outcome: Progressing   Problem: Nutrition: Goal: Adequate nutrition will be maintained Outcome: Progressing   Problem: Education: Goal: Knowledge of [place] [facility] Education information/materials will improve Outcome: Progressing Goal: Verbalization of understanding the information provided will improve Outcome: Progressing

## 2022-07-20 NOTE — Progress Notes (Signed)
[facility] MD Progress Note  07/20/2022 12:10 PM Travis Sandoval  MRN:  [id number] Subjective: Follow-up 42 year old man with schizophrenia.  No new complaints Principal Problem: Schizophrenia Diagnosis: Principal Problem:   Schizophrenia ([place]) Active Problems:   Psychosis ([place])  Total Time spent with patient: 30 minutes  Past Psychiatric History: Past history of schizophrenia  Past Medical History:  Past Medical History:  Diagnosis Date   ADHD    Bipolar 1 disorder ([place])    Hepatitis C    Schizoaffective disorder ([place])    History reviewed. No pertinent surgical history. Family History:  Family History  Family history unknown: Yes   Family Psychiatric  History: See previous Social History:  Social History   Substance and Sexual Activity  Alcohol Use Not Currently     Social History   Substance and Sexual Activity  Drug Use Not Currently    Social History   Socioeconomic History   Marital status: Unknown    Spouse name: Not on file   Number of children: Not on file   Years of education: Not on file   Highest education level: Not on file  Occupational History   Not on file  Tobacco Use   Smoking status: Every Day    Packs/day: 0.25    Years: 15.00    Additional pack years: 0.00    Total pack years: 3.75    Types: Cigarettes   Smokeless tobacco: Not on file  Vaping Use   Vaping Use: Not on file  Substance and Sexual Activity   Alcohol use: Not Currently   Drug use: Not Currently   Sexual activity: Not Currently  Other Topics Concern   Not on file  Social History Narrative   Not on file   Social Determinants of Health   Financial Resource Strain: Not on file  Food Insecurity: Patient Declined (05/18/2022)   Hunger Vital Sign    Worried About Running Out of Food in the Last Year: Patient declined    [place] in the Last Year: Patient declined  Transportation Needs: Patient Declined (05/18/2022)   [place] - Hydrologist  (Medical): Patient declined    Lack of Transportation (Non-Medical): Patient declined  Physical Activity: Not on file  Stress: Not on file  Social Connections: Not on file   Additional Social History:                         Sleep: Fair  Appetite:  Fair  Current Medications: Current Facility-Administered Medications  Medication Dose Route Frequency Provider Last Rate Last Admin   acetaminophen (TYLENOL) tablet 650 mg  650 mg Oral Q6H PRN [name], NP       alum & mag hydroxide-simeth (MAALOX/MYLANTA) 200-200-20 MG/5ML suspension 30 mL  30 mL Oral Q4H PRN [name], NP   30 mL at 07/10/22 2153   ARIPiprazole ER (ABILIFY MAINTENA) injection 400 mg  400 mg Intramuscular Q28 days Rilee Knoll T, MD   400 mg at 06/27/22 1701   cloZAPine (CLOZARIL) tablet 350 mg  350 mg Oral QHS Jakelin Taussig T, MD   350 mg at 07/19/22 2133   docusate sodium (COLACE) capsule 200 mg  200 mg Oral BID Ossiel Marchio T, MD   200 mg at 07/20/22 0825   famotidine (PEPCID) tablet 20 mg  20 mg Oral BID Endi Lagman, [name], MD   20 mg at 07/10/22 1010   hydrOXYzine (ATARAX) tablet  50 mg  50 mg Oral Q6H PRN Eshani Maestre T, MD   50 mg at 07/08/22 2100   magnesium citrate solution 1 Bottle  1 Bottle Oral Daily PRN Wynelle Cleveland, RPH   1 Bottle at 07/19/22 1643   magnesium hydroxide (MILK OF MAGNESIA) suspension 30 mL  30 mL Oral Daily PRN Wynelle Cleveland, RPH   30 mL at 07/18/22 1718   mineral oil liquid   Oral Daily PRN Parks Ranger, DO   Given at 07/17/22 1201   nicotine polacrilex (NICORETTE) gum 2 mg  2 mg Oral PRN Josia Cueva, [name], MD   2 mg at 04/23/22 2200   polyethylene glycol (MIRALAX / GLYCOLAX) packet 17 g  17 g Oral Daily Kewanda Poland, [name], MD   17 g at 07/20/22 0825   senna-docusate (Senokot-S) tablet 2 tablet  2 tablet Oral Daily PRN Iban Utz, [name], MD   2 tablet at 07/14/22 0809   ziprasidone (GEODON) injection 20 mg  20 mg Intramuscular Q12H PRN [name], MD         Lab Results: No results found for this or any previous visit (from the past 63 hour(s)).  Blood Alcohol level:  Lab Results  Component Value Date   ETH <10 0000000    Metabolic Disorder Labs: Lab Results  Component Value Date   HGBA1C 5.5 04/24/2022   MPG 111 04/24/2022   No results found for: "PROLACTIN" Lab Results  Component Value Date   CHOL 220 (H) 04/24/2022   TRIG 700 (H) 04/24/2022   HDL 35 (L) 04/24/2022   CHOLHDL 6.3 04/24/2022   VLDL UNABLE TO CALCULATE IF TRIGLYCERIDE OVER 400 mg/dL 04/24/2022   LDLCALC UNABLE TO CALCULATE IF TRIGLYCERIDE OVER 400 mg/dL 04/24/2022    Physical Findings: AIMS: Facial and Oral Movements Muscles of Facial Expression: None, normal Lips and Perioral Area: None, normal Jaw: None, normal Tongue: None, normal,Extremity Movements Upper (arms, wrists, hands, fingers): None, normal Lower (legs, knees, ankles, toes): None, normal, Trunk Movements Neck, shoulders, hips: None, normal, Overall Severity Severity of abnormal movements (highest score from questions above): None, normal Incapacitation due to abnormal movements: None, normal Patient's awareness of abnormal movements (rate only patient's report): No Awareness, Dental Status Current problems with teeth and/or dentures?: No Does patient usually wear dentures?: No  CIWA:    COWS:     Musculoskeletal: Strength & Muscle Tone: within normal limits Gait & Station: normal Patient leans: N/A  Psychiatric Specialty Exam:  Presentation  General Appearance:  Disheveled  Eye Contact: Minimal  Speech: Pressured  Speech Volume: Normal  Handedness: Right   Mood and Affect  Mood: Euphoric; Irritable  Affect: Inappropriate   Thought Process  Thought Processes: Disorganized  Descriptions of Associations:Loose  Orientation:Partial  Thought Content:Illogical; Scattered  History of Schizophrenia/Schizoaffective disorder:No data recorded Duration of  Psychotic Symptoms:No data recorded Hallucinations:No data recorded Ideas of Reference:Delusions; Paranoia  Suicidal Thoughts:No data recorded Homicidal Thoughts:No data recorded  Sensorium  Memory: Immediate Poor; Recent Poor; Remote Poor  Judgment: Poor  Insight: Poor   Executive Functions  Concentration: Fair  Attention Span: Fair  Recall: Poor  Fund of Knowledge: Poor  Language: Poor   Psychomotor Activity  Psychomotor Activity:No data recorded  Assets  Assets: Desire for Improvement; Social Support   Sleep  Sleep:No data recorded   Physical Exam: Physical Exam Vitals and nursing note reviewed.  Constitutional:      Appearance: Normal appearance.  HENT:     Head: Normocephalic  and atraumatic.     Mouth/Throat:     Pharynx: Oropharynx is clear.  Eyes:     Pupils: Pupils are equal, round, and reactive to light.  Cardiovascular:     Rate and Rhythm: Normal rate and regular rhythm.  Pulmonary:     Effort: Pulmonary effort is normal.     Breath sounds: Normal breath sounds.  Abdominal:     General: Abdomen is flat.     Palpations: Abdomen is soft.  Musculoskeletal:        General: Normal range of motion.  Skin:    General: Skin is warm and dry.  Neurological:     General: No focal deficit present.     Mental Status: He is alert. Mental status is at baseline.  Psychiatric:        Mood and Affect: Mood normal.        Thought Content: Thought content normal.    Review of Systems  Constitutional: Negative.   HENT: Negative.    Eyes: Negative.   Respiratory: Negative.    Cardiovascular: Negative.   Gastrointestinal: Negative.   Musculoskeletal: Negative.   Skin: Negative.   Neurological: Negative.   Psychiatric/Behavioral: Negative.     Blood pressure 118/81, pulse (!) 105, temperature 97.6 F (36.4 C), temperature source Oral, resp. rate 18, height 5\' 9"  (1.753 m), weight 96.4 kg, SpO2 97 %. Body mass index is 31.38  kg/m.   Treatment Plan Summary: Plan nothing new or different with the patient.  Continues to wait on placement possibilities  Alethia Berthold, MD 07/20/2022, 12:10 PM

## 2022-07-20 NOTE — Progress Notes (Signed)
D: Patient alert and oriented x 3, affect is flat but he brightens upon approach.patient's thoughts are organized and coherent, he denies SI/HI/AVH. Patient is pleasant and cooperative. Patient appears less anxious,and is interacting with peers and staff appropriately.  A: Patient was offered support and encouragement. He was given scheduled medications and encouraged to attend groups.  15 minute checks were done for safety.  R: Patient attends groups and interacts with peers and staff appropriately, he is  also complaint with medication. Patient has no complaints and he is receptive to treatment,he was offered emotional support and safety maintained on unit.

## 2022-07-21 DIAGNOSIS — F203 Undifferentiated schizophrenia: Secondary | ICD-10-CM | POA: Diagnosis not present

## 2022-07-21 NOTE — Progress Notes (Signed)
[facility] MD Progress Note  07/21/2022 10:31 AM Travis Sandoval  MRN:  [id number] Subjective: Follow-up patient with schizophrenia.  Patient has no new complaints.  No change to behavior.  Intermittently compliant with medicine.  Still complains of constipation. Principal Problem: Schizophrenia Diagnosis: Principal Problem:   Schizophrenia ([place]) Active Problems:   Psychosis ([place])  Total Time spent with patient: 30 minutes  Past Psychiatric History: Past history of schizophrenia  Past Medical History:  Past Medical History:  Diagnosis Date   ADHD    Bipolar 1 disorder ([place])    Hepatitis C    Schizoaffective disorder ([place])    History reviewed. No pertinent surgical history. Family History:  Family History  Family history unknown: Yes   Family Psychiatric  History: See previous Social History:  Social History   Substance and Sexual Activity  Alcohol Use Not Currently     Social History   Substance and Sexual Activity  Drug Use Not Currently    Social History   Socioeconomic History   Marital status: Unknown    Spouse name: Not on file   Number of children: Not on file   Years of education: Not on file   Highest education level: Not on file  Occupational History   Not on file  Tobacco Use   Smoking status: Every Day    Packs/day: 0.25    Years: 15.00    Additional pack years: 0.00    Total pack years: 3.75    Types: Cigarettes   Smokeless tobacco: Not on file  Vaping Use   Vaping Use: Not on file  Substance and Sexual Activity   Alcohol use: Not Currently   Drug use: Not Currently   Sexual activity: Not Currently  Other Topics Concern   Not on file  Social History Narrative   Not on file   Social Determinants of Health   Financial Resource Strain: Not on file  Food Insecurity: Patient Declined (05/18/2022)   Hunger Vital Sign    Worried About Running Out of Food in the Last Year: Patient declined    [place] in the Last Year: Patient declined   Transportation Needs: Patient Declined (05/18/2022)   [place] - Hydrologist (Medical): Patient declined    Lack of Transportation (Non-Medical): Patient declined  Physical Activity: Not on file  Stress: Not on file  Social Connections: Not on file   Additional Social History:                         Sleep: Fair  Appetite:  Fair  Current Medications: Current Facility-Administered Medications  Medication Dose Route Frequency Provider Last Rate Last Admin   acetaminophen (TYLENOL) tablet 650 mg  650 mg Oral Q6H PRN [name], NP       alum & mag hydroxide-simeth (MAALOX/MYLANTA) 200-200-20 MG/5ML suspension 30 mL  30 mL Oral Q4H PRN [name], NP   30 mL at 07/10/22 2153   ARIPiprazole ER (ABILIFY MAINTENA) injection 400 mg  400 mg Intramuscular Q28 days Rozell Kettlewell T, MD   400 mg at 06/27/22 1701   cloZAPine (CLOZARIL) tablet 350 mg  350 mg Oral QHS Tyann Niehaus T, MD   350 mg at 07/20/22 2102   docusate sodium (COLACE) capsule 200 mg  200 mg Oral BID Jrue Yambao T, MD   200 mg at 07/20/22 1740   famotidine (PEPCID) tablet 20 mg  20 mg Oral BID  Analia Zuk, [name], MD   20 mg at 07/20/22 1740   hydrOXYzine (ATARAX) tablet 50 mg  50 mg Oral Q6H PRN Quincey Nored T, MD   50 mg at 07/20/22 2102   magnesium citrate solution 1 Bottle  1 Bottle Oral Daily PRN Wynelle Cleveland, RPH   1 Bottle at 07/19/22 1643   magnesium hydroxide (MILK OF MAGNESIA) suspension 30 mL  30 mL Oral Daily PRN Wynelle Cleveland, RPH   30 mL at 07/18/22 1718   mineral oil liquid   Oral Daily PRN Parks Ranger, DO   Given at 07/17/22 1201   nicotine polacrilex (NICORETTE) gum 2 mg  2 mg Oral PRN Tatsuya Okray, [name], MD   2 mg at 04/23/22 2200   polyethylene glycol (MIRALAX / GLYCOLAX) packet 17 g  17 g Oral Daily Phuc Kluttz, [name], MD   17 g at 07/20/22 0825   senna-docusate (Senokot-S) tablet 2 tablet  2 tablet Oral Daily PRN Thanh Pomerleau, [name], MD   2  tablet at 07/14/22 0809   ziprasidone (GEODON) injection 20 mg  20 mg Intramuscular Q12H PRN [name], MD        Lab Results: No results found for this or any previous visit (from the past 26 hour(s)).  Blood Alcohol level:  Lab Results  Component Value Date   ETH <10 0000000    Metabolic Disorder Labs: Lab Results  Component Value Date   HGBA1C 5.5 04/24/2022   MPG 111 04/24/2022   No results found for: "PROLACTIN" Lab Results  Component Value Date   CHOL 220 (H) 04/24/2022   TRIG 700 (H) 04/24/2022   HDL 35 (L) 04/24/2022   CHOLHDL 6.3 04/24/2022   VLDL UNABLE TO CALCULATE IF TRIGLYCERIDE OVER 400 mg/dL 04/24/2022   LDLCALC UNABLE TO CALCULATE IF TRIGLYCERIDE OVER 400 mg/dL 04/24/2022    Physical Findings: AIMS: Facial and Oral Movements Muscles of Facial Expression: None, normal Lips and Perioral Area: None, normal Jaw: None, normal Tongue: None, normal,Extremity Movements Upper (arms, wrists, hands, fingers): None, normal Lower (legs, knees, ankles, toes): None, normal, Trunk Movements Neck, shoulders, hips: None, normal, Overall Severity Severity of abnormal movements (highest score from questions above): None, normal Incapacitation due to abnormal movements: None, normal Patient's awareness of abnormal movements (rate only patient's report): No Awareness, Dental Status Current problems with teeth and/or dentures?: No Does patient usually wear dentures?: No  CIWA:    COWS:     Musculoskeletal: Strength & Muscle Tone: within normal limits Gait & Station: normal Patient leans: N/A  Psychiatric Specialty Exam:  Presentation  General Appearance:  Disheveled  Eye Contact: Minimal  Speech: Pressured  Speech Volume: Normal  Handedness: Right   Mood and Affect  Mood: Euphoric; Irritable  Affect: Inappropriate   Thought Process  Thought Processes: Disorganized  Descriptions of Associations:Loose  Orientation:Partial  Thought  Content:Illogical; Scattered  History of Schizophrenia/Schizoaffective disorder:No data recorded Duration of Psychotic Symptoms:No data recorded Hallucinations:No data recorded Ideas of Reference:Delusions; Paranoia  Suicidal Thoughts:No data recorded Homicidal Thoughts:No data recorded  Sensorium  Memory: Immediate Poor; Recent Poor; Remote Poor  Judgment: Poor  Insight: Poor   Executive Functions  Concentration: Fair  Attention Span: Fair  Recall: Poor  Fund of Knowledge: Poor  Language: Poor   Psychomotor Activity  Psychomotor Activity:No data recorded  Assets  Assets: Desire for Improvement; Social Support   Sleep  Sleep:No data recorded   Physical Exam: Physical Exam Vitals and nursing note reviewed.  Constitutional:  Appearance: Normal appearance.  HENT:     Head: Normocephalic and atraumatic.     Mouth/Throat:     Pharynx: Oropharynx is clear.  Eyes:     Pupils: Pupils are equal, round, and reactive to light.  Cardiovascular:     Rate and Rhythm: Normal rate and regular rhythm.  Pulmonary:     Effort: Pulmonary effort is normal.     Breath sounds: Normal breath sounds.  Abdominal:     General: Abdomen is flat.     Palpations: Abdomen is soft.  Musculoskeletal:        General: Normal range of motion.  Skin:    General: Skin is warm and dry.  Neurological:     General: No focal deficit present.     Mental Status: He is alert. Mental status is at baseline.  Psychiatric:        Attention and Perception: Attention normal.        Mood and Affect: Mood normal. Affect is blunt.        Speech: Speech is delayed.        Behavior: Behavior is withdrawn.        Thought Content: Thought content normal.    Review of Systems  Constitutional: Negative.   HENT: Negative.    Eyes: Negative.   Respiratory: Negative.    Cardiovascular: Negative.   Gastrointestinal: Negative.   Musculoskeletal: Negative.   Skin: Negative.    Neurological: Negative.   Psychiatric/Behavioral: Negative.     Blood pressure 118/81, pulse (!) 105, temperature 97.6 F (36.4 C), temperature source Oral, resp. rate 18, height 5\' 9"  (1.753 m), weight 96.4 kg, SpO2 97 %. Body mass index is 31.38 kg/m.   Treatment Plan Summary: Medication management and Plan no change to treatment plan.  Supportive counseling.  I believe that there is some progress that might be made soon about finding discharge planning.  Alethia Berthold, MD 07/21/2022, 10:31 AM

## 2022-07-21 NOTE — Progress Notes (Signed)
Patient is A+O. He denies SI/HI/AVH. He denies anxiety and depression. Pain 0/10. Patient refused gi-type medications. "I took them yesterday but I don't want to take them today. I had a BM yesterday".  Patient mainly isolates to his room but will appear for meals.  Q15 minute checks in place.

## 2022-07-21 NOTE — Group Note (Signed)
Date:  07/21/2022 Time:  10:05 AM  Group Topic/Focus:  Goals Group:   The focus of this group is to help patients establish daily goals to achieve during treatment and discuss how the patient can incorporate goal setting into their daily lives to aide in recovery.  Community Group   Participation Level:  Did Not Attend  [name] [name] 07/21/2022, 10:05 AM

## 2022-07-21 NOTE — Plan of Care (Signed)
Pt received in bed with eyes open. RR even and unlabored. Alert and oriented.  Calm and cooperative. Bright affect. Pt states he wants to take a shower and wash his clothes. Clean scrubs and towels provided. PO medications given as scheduled. Tol well. Limited interaction with peers noted. Isolates in room except for meds/meals. Receptive to care. Support and encouragement provided.  Routine safety checks conducted every 15 minutes.  Patient informed to notify staff with problems or concerns. Denies SI/HI. No AV/H noted. Patient contracts for safety  this time.  Patient remains safe at this time. No c/o pain/discomfort noted.   Problem: Education: Goal: Knowledge of [place] [facility] Education information/materials will improve Outcome: Progressing   Problem: Education: Goal: Emotional status will improve Outcome: Progressing   Problem: Education: Goal: Mental status will improve Outcome: Progressing

## 2022-07-21 NOTE — Psychosocial Assessment (Signed)
[place] Group Notes:  (Nursing/MHT/Case Management/Adjunct)  Date:  07/21/2022  Time:  3:21 AM  Type of Therapy:  Group Therapy  Participation Level:  None  Participation Quality:  Appropriate  Affect:  Flat  Cognitive:  Alert  Insight:  Appropriate  Engagement in Group:  Lacking  Modes of Intervention:  Support  Summary of Progress/Problems:  Cherie Dark 07/21/2022, 3:21 AM

## 2022-07-21 NOTE — BH IP Treatment Plan (Signed)
Interdisciplinary Treatment and Diagnostic Plan Update  07/21/2022 Time of Session: 0830 Eryk Cheslock MRN: [id number]  Principal Diagnosis: Schizophrenia  Secondary Diagnoses: Principal Problem:   Schizophrenia ([place]) Active Problems:   Psychosis ([place])   Current Medications:  Current Facility-Administered Medications  Medication Dose Route Frequency Provider Last Rate Last Admin   acetaminophen (TYLENOL) tablet 650 mg  650 mg Oral Q6H PRN [name], NP       alum & mag hydroxide-simeth (MAALOX/MYLANTA) 200-200-20 MG/5ML suspension 30 mL  30 mL Oral Q4H PRN [name], NP   30 mL at 07/10/22 2153   ARIPiprazole ER (ABILIFY MAINTENA) injection 400 mg  400 mg Intramuscular Q28 days [name], [name], MD   400 mg at 06/27/22 1701   cloZAPine (CLOZARIL) tablet 350 mg  350 mg Oral QHS [name], [name], MD   350 mg at 07/21/22 2123   docusate sodium (COLACE) capsule 200 mg  200 mg Oral BID [name], [name], MD   200 mg at 07/20/22 1740   famotidine (PEPCID) tablet 20 mg  20 mg Oral BID [name], [name], MD   20 mg at 07/20/22 1740   hydrOXYzine (ATARAX) tablet 50 mg  50 mg Oral Q6H PRN [name], [name], MD   50 mg at 07/20/22 2102   magnesium citrate solution 1 Bottle  1 Bottle Oral Daily PRN Wynelle Cleveland, RPH   1 Bottle at 07/19/22 1643   magnesium hydroxide (MILK OF MAGNESIA) suspension 30 mL  30 mL Oral Daily PRN Wynelle Cleveland, RPH   30 mL at 07/18/22 1718   mineral oil liquid   Oral Daily PRN Parks Ranger, DO   Given at 07/17/22 1201   nicotine polacrilex (NICORETTE) gum 2 mg  2 mg Oral PRN [name], [name], MD   2 mg at 04/23/22 2200   polyethylene glycol (MIRALAX / GLYCOLAX) packet 17 g  17 g Oral Daily [name], [name], MD   17 g at 07/20/22 0825   senna-docusate (Senokot-S) tablet 2 tablet  2 tablet Oral Daily PRN [name], [name], MD   2 tablet at 07/14/22 0809   ziprasidone (GEODON) injection 20 mg  20 mg Intramuscular Q12H PRN [name], MD        PTA Medications: Medications Prior to Admission  Medication Sig Dispense Refill Last Dose   ABILIFY MAINTENA 400 MG SRER injection Inject 400 mg into the muscle every 28 (twenty-eight) days.      ARIPiprazole (ABILIFY) 10 MG tablet Take 10 mg by mouth daily. (Patient not taking: Reported on 04/16/2022)      atomoxetine (STRATTERA) 40 MG capsule Take 40 mg by mouth daily. (Patient not taking: Reported on 04/16/2022)      atorvastatin (LIPITOR) 40 MG tablet Take 40 mg by mouth daily.      budesonide-formoterol (SYMBICORT) 160-4.5 MCG/ACT inhaler Inhale 2 puffs into the lungs.      cetirizine (ZYRTEC) 10 MG tablet Take 10 mg by mouth daily.      clozapine (CLOZARIL) 200 MG tablet Take 200 mg by mouth 2 (two) times daily. Take along with one 25 mg tablet for total 225 mg twice daily      cloZAPine (CLOZARIL) 25 MG tablet Take 25 mg by mouth 2 (two) times daily. Take along with one 200 mg tablet for total 225 mg twice daily      Dexlansoprazole (DEXILANT) 30 MG capsule Take 30 mg by mouth daily. (Patient not taking: Reported on 04/16/2022)  hydrOXYzine (VISTARIL) 50 MG capsule Take 50 mg by mouth 3 (three) times daily as needed for itching. (Patient not taking: Reported on 04/16/2022)      Melatonin 5 MG TABS Take 5 mg by mouth at bedtime as needed (sleep).      metoprolol succinate (TOPROL-XL) 25 MG 24 hr tablet Take 12.5 mg by mouth daily.      Phosphatidyl Choline 65 MG TABS Take 1 tablet by mouth daily.  (Patient not taking: Reported on 04/16/2022)      valproic acid (DEPAKENE) 250 MG/5ML solution Take 750-1,000 mLs by mouth 2 (two) times daily. 1000 mg (20 mL) every morning and 750 mg (15 mL) daily at bedtime       Patient Stressors: Other: Psychosis    Patient Strengths: Ability for insight  Active sense of humor  Average or above average intelligence  Capable of independent living  Supportive family/friends   Treatment Modalities: Medication Management, Group therapy, Case  management,  1 to 1 session with clinician, Psychoeducation, Recreational therapy.   Physician Treatment Plan for Primary Diagnosis: Schizophrenia Long Term Goal(s):     Short Term Goals: None, pt is a poor hx.  Medication Management: Evaluate patient's response, side effects, and tolerance of medication regimen.  Therapeutic Interventions: 1 to 1 sessions, Unit Group sessions and Medication administration.  Evaluation of Outcomes: Adequate for Discharge  Physician Treatment Plan for Secondary Diagnosis: Principal Problem:   Schizophrenia ([place]) Active Problems:   Psychosis ([place])  Long Term Goal(s):     Short Term Goals: None, pt is a poor hx.     Medication Management: Evaluate patient's response, side effects, and tolerance of medication regimen.  Therapeutic Interventions: 1 to 1 sessions, Unit Group sessions and Medication administration.  Evaluation of Outcomes: Adequate for Discharge   RN Treatment Plan for Primary Diagnosis: Schizophrenia Long Term Goal(s): Knowledge of disease and therapeutic regimen to maintain health will improve  Short Term Goals: Ability to remain free from injury will improve, Ability to verbalize frustration and anger appropriately will improve, Ability to demonstrate self-control, Ability to participate in decision making will improve, Ability to verbalize feelings will improve, Ability to disclose and discuss suicidal ideas, Ability to identify and develop effective coping behaviors will improve, and Compliance with prescribed medications will improve  Medication Management: RN will administer medications as ordered by provider, will assess and evaluate patient's response and provide education to patient for prescribed medication. RN will report any adverse and/or side effects to prescribing provider.  Therapeutic Interventions: 1 on 1 counseling sessions, Psychoeducation, Medication administration, Evaluate responses to treatment, Monitor vital  signs and CBGs as ordered, Perform/monitor CIWA, COWS, AIMS and Fall Risk screenings as ordered, Perform wound care treatments as ordered.  Evaluation of Outcomes: Adequate for Discharge   LCSW Treatment Plan for Primary Diagnosis: Schizophrenia Long Term Goal(s): Safe transition to appropriate next level of care at discharge, Engage patient in therapeutic group addressing interpersonal concerns.  Short Term Goals: Engage patient in aftercare planning with referrals and resources, Increase social support, Increase ability to appropriately verbalize feelings, Increase emotional regulation, Facilitate acceptance of mental health diagnosis and concerns, Facilitate patient progression through stages of change regarding substance use diagnoses and concerns, Identify triggers associated with mental health/substance abuse issues, and Increase skills for wellness and recovery  Therapeutic Interventions: Assess for all discharge needs, 1 to 1 time with Social worker, Explore available resources and support systems, Assess for adequacy in community support network, Educate family and significant other(s) on  suicide prevention, Complete Psychosocial Assessment, Interpersonal group therapy.  Evaluation of Outcomes: Adequate for Discharge   Progress in Treatment: Attending groups: No. Participating in groups: No. Taking medication as prescribed: Yes. Toleration medication: Yes. Family/Significant other contact made: Yes, individual(s) contacted:  Barton Canlas, father, [phone]  Patient understands diagnosis: No. Discussing patient identified problems/goals with staff: Yes. Medical problems stabilized or resolved: Yes. Denies suicidal/homicidal ideation: Yes. Issues/concerns per patient self-inventory: Yes. Other: none  New problem(s) identified: No, Describe:  none  New Short Term/Long Term Goal(s): Patient to work towards  elimination of symptoms of psychosis, medication management for mood  stabilization;  development of comprehensive mental wellness plan.  Patient Goals:  No additional goals identified at this time. Patient to continue to work towards original goals identified in initial treatment team meeting. CSW will remain available to patient should they voice additional treatment goals.   Discharge Plan or Barriers: Patient lacks adequate funding, CSW to continue pursing payor source to secure placement. Patient is unable to living independently per psychiatrist.   Reason for Continuation of Hospitalization: Delusions  Medication stabilization  Estimated Length of Stay: TBD  Scribe for Treatment Team: Larose Kells 07/21/2022 10:19 PM

## 2022-07-22 DIAGNOSIS — F203 Undifferentiated schizophrenia: Secondary | ICD-10-CM | POA: Diagnosis not present

## 2022-07-22 LAB — CLOZAPINE (CLOZARIL)
Clozapine Lvl: 937 ng/mL — ABNORMAL HIGH (ref 350–600)
NorClozapine: 405 ng/mL
Total(Cloz+Norcloz): 1342 ng/mL

## 2022-07-22 NOTE — Progress Notes (Addendum)
Patient continues to refuse scheduled Pepcid and Colace, stating that he only wants "the powder stuff, that works", referring to scheduled Miralax. MD is aware.

## 2022-07-22 NOTE — Progress Notes (Signed)
Patient calm and pleasant during assessment denying SI/HI/AVH. Pt compliant with medication administration per MD orders. Pt given education, support, and encouragement to be active in his treatment plan. Pt being monitored Q 15 minutes for safety per unit protocol, remains safe on the unit  

## 2022-07-22 NOTE — Progress Notes (Addendum)
ADDENDUM Guardian reports she will be sending guardianship paperwork to Social Security and apply to be the payee. Guardian to update CSW once completed; estimated time 203 days.   Signed:  Durenda Hurt, MSW, LCSW, LCAS 07/22/2022 3:22 PM  [facility] LCSW Progress Note   07/22/2022    2:40 PM  Travis Sandoval   [id number]   Type of Contact and Topic: Care Coordination   CSW contacted Marina Goodell, [place] guardian, in order to get update on SSI application decision. Guardian states she has not yet called the social security office, will do so after getting off the phone and call back. Situation ongoing, CSW will continue to monitor and update note as more information becomes available.     Signed:  Durenda Hurt, MSW, LCSW, LCAS 07/22/2022 2:40 PM

## 2022-07-22 NOTE — Progress Notes (Signed)
[facility] MD Progress Note  07/22/2022 3:27 PM Travis Sandoval  MRN:  [id number] Subjective: Follow-up patient was schizophrenia with no new complaints. Principal Problem: Schizophrenia Diagnosis: Principal Problem:   Schizophrenia Active Problems:   Psychosis  Total Time spent with patient: 15 minutes  Past Psychiatric History: Past history of schizophrenia  Past Medical History:  Past Medical History:  Diagnosis Date   ADHD    Bipolar 1 disorder ([place])    Hepatitis C    Schizoaffective disorder ([employer])    History reviewed. No pertinent surgical history. Family History:  Family History  Family history unknown: Yes   Family Psychiatric  History: See previous Social History:  Social History   Substance and Sexual Activity  Alcohol Use Not Currently     Social History   Substance and Sexual Activity  Drug Use Not Currently    Social History   Socioeconomic History   Marital status: Unknown    Spouse name: Not on file   Number of children: Not on file   Years of education: Not on file   Highest education level: Not on file  Occupational History   Not on file  Tobacco Use   Smoking status: Every Day    Packs/day: 0.25    Years: 15.00    Additional pack years: 0.00    Total pack years: 3.75    Types: Cigarettes   Smokeless tobacco: Not on file  Vaping Use   Vaping Use: Not on file  Substance and Sexual Activity   Alcohol use: Not Currently   Drug use: Not Currently   Sexual activity: Not Currently  Other Topics Concern   Not on file  Social History Narrative   Not on file   Social Determinants of Health   Financial Resource Strain: Not on file  Food Insecurity: Patient Declined (05/18/2022)   Hunger Vital Sign    Worried About Running Out of Food in the Last Year: Patient declined    [place] in the Last Year: Patient declined  Transportation Needs: Patient Declined (05/18/2022)   [place] - Hydrologist (Medical): Patient  declined    Lack of Transportation (Non-Medical): Patient declined  Physical Activity: Not on file  Stress: Not on file  Social Connections: Not on file   Additional Social History:                         Sleep: Fair  Appetite:  Fair  Current Medications: Current Facility-Administered Medications  Medication Dose Route Frequency Provider Last Rate Last Admin   acetaminophen (TYLENOL) tablet 650 mg  650 mg Oral Q6H PRN [name], NP       alum & mag hydroxide-simeth (MAALOX/MYLANTA) 200-200-20 MG/5ML suspension 30 mL  30 mL Oral Q4H PRN [name], NP   30 mL at 07/10/22 2153   ARIPiprazole ER (ABILIFY MAINTENA) injection 400 mg  400 mg Intramuscular Q28 days Armany Mano T, MD   400 mg at 06/27/22 1701   cloZAPine (CLOZARIL) tablet 350 mg  350 mg Oral QHS Gwendlyn Hanback T, MD   350 mg at 07/21/22 2123   docusate sodium (COLACE) capsule 200 mg  200 mg Oral BID Onnie Hatchel T, MD   200 mg at 07/20/22 1740   famotidine (PEPCID) tablet 20 mg  20 mg Oral BID Kalai Baca T, MD   20 mg at 07/20/22 1740   hydrOXYzine (ATARAX) tablet 50 mg  50 mg Oral Q6H PRN Danity Schmelzer T, MD   50 mg at 07/20/22 2102   magnesium citrate solution 1 Bottle  1 Bottle Oral Daily PRN Wynelle Cleveland, RPH   1 Bottle at 07/19/22 1643   magnesium hydroxide (MILK OF MAGNESIA) suspension 30 mL  30 mL Oral Daily PRN Wynelle Cleveland, RPH   30 mL at 07/18/22 1718   mineral oil liquid   Oral Daily PRN Parks Ranger, DO   Given at 07/17/22 1201   nicotine polacrilex (NICORETTE) gum 2 mg  2 mg Oral PRN Kyrra Prada, [name], MD   2 mg at 04/23/22 2200   polyethylene glycol (MIRALAX / GLYCOLAX) packet 17 g  17 g Oral Daily Greenleigh Kauth, [name], MD   17 g at 07/22/22 0813   senna-docusate (Senokot-S) tablet 2 tablet  2 tablet Oral Daily PRN Havana Baldwin, [name], MD   2 tablet at 07/14/22 0809   ziprasidone (GEODON) injection 20 mg  20 mg Intramuscular Q12H PRN [name], MD        Lab  Results: No results found for this or any previous visit (from the past 68 hour(s)).  Blood Alcohol level:  Lab Results  Component Value Date   ETH <10 0000000    Metabolic Disorder Labs: Lab Results  Component Value Date   HGBA1C 5.5 04/24/2022   MPG 111 04/24/2022   No results found for: "PROLACTIN" Lab Results  Component Value Date   CHOL 220 (H) 04/24/2022   TRIG 700 (H) 04/24/2022   HDL 35 (L) 04/24/2022   CHOLHDL 6.3 04/24/2022   VLDL UNABLE TO CALCULATE IF TRIGLYCERIDE OVER 400 mg/dL 04/24/2022   LDLCALC UNABLE TO CALCULATE IF TRIGLYCERIDE OVER 400 mg/dL 04/24/2022    Physical Findings: AIMS: Facial and Oral Movements Muscles of Facial Expression: None, normal Lips and Perioral Area: None, normal Jaw: None, normal Tongue: None, normal,Extremity Movements Upper (arms, wrists, hands, fingers): None, normal Lower (legs, knees, ankles, toes): None, normal, Trunk Movements Neck, shoulders, hips: None, normal, Overall Severity Severity of abnormal movements (highest score from questions above): None, normal Incapacitation due to abnormal movements: None, normal Patient's awareness of abnormal movements (rate only patient's report): No Awareness, Dental Status Current problems with teeth and/or dentures?: No Does patient usually wear dentures?: No  CIWA:    COWS:     Musculoskeletal: Strength & Muscle Tone: within normal limits Gait & Station: normal Patient leans: N/A  Psychiatric Specialty Exam:  Presentation  General Appearance:  Disheveled  Eye Contact: Minimal  Speech: Pressured  Speech Volume: Normal  Handedness: Right   Mood and Affect  Mood: Euphoric; Irritable  Affect: Inappropriate   Thought Process  Thought Processes: Disorganized  Descriptions of Associations:Loose  Orientation:Partial  Thought Content:Illogical; Scattered  History of Schizophrenia/Schizoaffective disorder:No data recorded Duration of Psychotic  Symptoms:No data recorded Hallucinations:No data recorded Ideas of Reference:Delusions; Paranoia  Suicidal Thoughts:No data recorded Homicidal Thoughts:No data recorded  Sensorium  Memory: Immediate Poor; Recent Poor; Remote Poor  Judgment: Poor  Insight: Poor   Executive Functions  Concentration: Fair  Attention Span: Fair  Recall: Poor  Fund of Knowledge: Poor  Language: Poor   Psychomotor Activity  Psychomotor Activity:No data recorded  Assets  Assets: Desire for Improvement; Social Support   Sleep  Sleep:No data recorded   Physical Exam: Physical Exam Vitals and nursing note reviewed.  Constitutional:      Appearance: Normal appearance.  HENT:     Head: Normocephalic and atraumatic.  Mouth/Throat:     Pharynx: Oropharynx is clear.  Eyes:     Pupils: Pupils are equal, round, and reactive to light.  Cardiovascular:     Rate and Rhythm: Normal rate and regular rhythm.  Pulmonary:     Effort: Pulmonary effort is normal.     Breath sounds: Normal breath sounds.  Abdominal:     General: Abdomen is flat.     Palpations: Abdomen is soft.  Musculoskeletal:        General: Normal range of motion.  Skin:    General: Skin is warm and dry.  Neurological:     General: No focal deficit present.     Mental Status: He is alert. Mental status is at baseline.  Psychiatric:        Mood and Affect: Mood normal.        Thought Content: Thought content normal.    Review of Systems  Constitutional: Negative.   HENT: Negative.    Eyes: Negative.   Respiratory: Negative.    Cardiovascular: Negative.   Gastrointestinal: Negative.   Musculoskeletal: Negative.   Skin: Negative.   Neurological: Negative.   Psychiatric/Behavioral: Negative.     Blood pressure (!) 106/57, pulse (!) 105, temperature 98 F (36.7 C), temperature source Oral, resp. rate 18, height 5\' 9"  (1.753 m), weight 96.4 kg, SpO2 97 %. Body mass index is 31.38 kg/m.   Treatment  Plan Summary: Medication management and Plan no change to medication.  We are just waiting on discharge planning  Alethia Berthold, MD 07/22/2022, 3:27 PM

## 2022-07-22 NOTE — Psychosocial Assessment (Signed)
[place] Group Notes:  (Nursing/MHT/Case Management/Adjunct)  Date:  07/22/2022  Time:  3:44 AM  Type of Therapy:  Group Therapy  Participation Level:  None  Participation Quality:  Appropriate  Affect:  Flat  Cognitive:  Alert  Insight:  None  Engagement in Group:  None  Modes of Intervention:  Support  Summary of Progress/Problems:  Travis Sandoval 07/22/2022, 3:44 AM

## 2022-07-22 NOTE — Plan of Care (Signed)
D- Patient alert and oriented. Patient presented in a pleasant mood on assessment stating that he slept "ok" last night and had no complaints to voice to this Probation officer. Patient denied SI, HI, AVH, and pain at this time. Patient also denied any signs/symptoms of depression and anxiety, stating that overall, he is feeling "normal", mood wise. Patient had no stated goals for today.  A- Some of scheduled medications administered to patient, per MD orders. Patient only wants to take the Miralax. Support and encouragement provided.  Routine safety checks conducted every 15 minutes.  Patient informed to notify staff with problems or concerns.  R- No adverse drug reactions noted. Patient contracts for safety at this time. Patient compliant with medications and treatment plan. Patient receptive, calm, and cooperative. Patient isolates to room, excepts for meals and medication. Patient remains safe at this time.  Problem: Education: Goal: Knowledge of General Education information will improve Description: Including pain rating scale, medication(s)/side effects and non-pharmacologic comfort measures Outcome: Progressing   Problem: Health Behavior/Discharge Planning: Goal: Ability to manage health-related needs will improve Outcome: Progressing   Problem: Clinical Measurements: Goal: Ability to maintain clinical measurements within normal limits will improve Outcome: Progressing Goal: Will remain free from infection Outcome: Progressing Goal: Diagnostic test results will improve Outcome: Progressing Goal: Respiratory complications will improve Outcome: Progressing Goal: Cardiovascular complication will be avoided Outcome: Progressing   Problem: Activity: Goal: Risk for activity intolerance will decrease Outcome: Progressing   Problem: Nutrition: Goal: Adequate nutrition will be maintained Outcome: Progressing   Problem: Coping: Goal: Level of anxiety will decrease Outcome: Progressing    Problem: Elimination: Goal: Will not experience complications related to urinary retention Outcome: Progressing   Problem: Pain Managment: Goal: General experience of comfort will improve Outcome: Progressing   Problem: Safety: Goal: Ability to remain free from injury will improve Outcome: Progressing   Problem: Skin Integrity: Goal: Risk for impaired skin integrity will decrease Outcome: Progressing   Problem: Education: Goal: Knowledge of [place] General Education information/materials will improve Outcome: Progressing Goal: Emotional status will improve Outcome: Progressing Goal: Mental status will improve Outcome: Progressing Goal: Verbalization of understanding the information provided will improve Outcome: Progressing   Problem: Activity: Goal: Interest or engagement in activities will improve Outcome: Progressing Goal: Sleeping patterns will improve Outcome: Progressing   Problem: Coping: Goal: Ability to verbalize frustrations and anger appropriately will improve Outcome: Progressing Goal: Ability to demonstrate self-control will improve Outcome: Progressing   Problem: Health Behavior/Discharge Planning: Goal: Identification of resources available to assist in meeting health care needs will improve Outcome: Progressing Goal: Compliance with treatment plan for underlying cause of condition will improve Outcome: Progressing   Problem: Physical Regulation: Goal: Ability to maintain clinical measurements within normal limits will improve Outcome: Progressing   Problem: Safety: Goal: Periods of time without injury will increase Outcome: Progressing   Problem: Activity: Goal: Will verbalize the importance of balancing activity with adequate rest periods Outcome: Progressing   Problem: Education: Goal: Will be free of psychotic symptoms Outcome: Progressing Goal: Knowledge of the prescribed therapeutic regimen will improve Outcome: Progressing    Problem: Coping: Goal: Coping ability will improve Outcome: Progressing Goal: Will verbalize feelings Outcome: Progressing   Problem: Health Behavior/Discharge Planning: Goal: Compliance with prescribed medication regimen will improve Outcome: Progressing   Problem: Nutritional: Goal: Ability to achieve adequate nutritional intake will improve Outcome: Progressing   Problem: Role Relationship: Goal: Ability to communicate needs accurately will improve Outcome: Progressing Goal: Ability to interact with others will  improve Outcome: Progressing   Problem: Safety: Goal: Ability to redirect hostility and anger into socially appropriate behaviors will improve Outcome: Progressing Goal: Ability to remain free from injury will improve Outcome: Progressing   Problem: Self-Care: Goal: Ability to participate in self-care as condition permits will improve Outcome: Progressing   Problem: Self-Concept: Goal: Will verbalize positive feelings about self Outcome: Progressing

## 2022-07-22 NOTE — Group Note (Signed)
Recreation Therapy Group Note   Group Topic:Goal Setting  Group Date: 07/22/2022 Start Time: 1000 End Time: 1100 Facilitators: Vilma Prader, LRT, CTRS Location:  Craft Room  Group Description: Scientist, physiological. Patients were given many different magazines, a glue stick, markers, and a piece of cardstock paper. LRT and pts discussed the importance of having goals in life. LRT and pts discussed the difference between short-term and long-term goals. LRT encouraged pts to create a vision board, with images they picked and then cut out by LRT from the magazine, for themselves, that capture their short and long-term goals. On the back of the paper, pt encouraged to write 3 different coping skills that can help them reach those goals. LRT encouraged pts to show and explain their vision board to the group. LRT offered to laminate vision board once dry and complete.   Affect/Mood: N/A   Participation Level: Did not attend    Clinical Observations/Individualized Feedback: Travis Sandoval did not attend group due to resting in his room.  Plan: Continue to engage patient in RT group sessions 2-3x/week.   Vilma Prader, LRT, CTRS 07/22/2022 11:45 AM

## 2022-07-22 NOTE — Group Note (Signed)
[facility] LCSW Group Therapy Note    Group Date: 07/22/2022 Start Time: 1300 End Time: 1400  Type of Therapy and Topic:  Group Therapy:  Overcoming Obstacles  Participation Level:  [facility] PARTICIPATION LEVEL: Did Not Attend   Description of Group:   In this group patients will be encouraged to explore what they see as obstacles to their own wellness and recovery. They will be guided to discuss their thoughts, feelings, and behaviors related to these obstacles. The group will process together ways to cope with barriers, with attention given to specific choices patients can make. Each patient will be challenged to identify changes they are motivated to make in order to overcome their obstacles. This group will be process-oriented, with patients participating in exploration of their own experiences as well as giving and receiving support and challenge from other group members.  Therapeutic Goals: 1. Patient will identify personal and current obstacles as they relate to admission. 2. Patient will identify barriers that currently interfere with their wellness or overcoming obstacles.  3. Patient will identify feelings, thought process and behaviors related to these barriers. 4. Patient will identify two changes they are willing to make to overcome these obstacles:    Summary of Patient Progress X   Therapeutic Modalities:   Cognitive Behavioral Therapy Solution Focused Therapy Motivational Interviewing Relapse Prevention Therapy   Shirl Harris, LCSW

## 2022-07-23 NOTE — Group Note (Signed)
LCSW Group Therapy Note  Group Date: 07/23/2022 Start Time: 1300 End Time: 1400   Type of Therapy and Topic:  Group Therapy - How To Cope with Nervousness about Discharge   Participation Level:  Did Not Attend   Description of Group This process group involved identification of patients' feelings about discharge. Some of them are scheduled to be discharged soon, while others are new admissions, but each of them was asked to share thoughts and feelings surrounding discharge from the hospital. One common theme was that they are excited at the prospect of going home, while another was that many of them are apprehensive about sharing why they were hospitalized. Patients were given the opportunity to discuss these feelings with their peers in preparation for discharge.  Therapeutic Goals  Patient will identify their overall feelings about pending discharge. Patient will think about how they might proactively address issues that they believe will once again arise once they get home (i.e. with parents). Patients will participate in discussion about having hope for change.   Summary of Patient Progress:   Patient did not attend group despite encouraged participation.     Therapeutic Modalities Cognitive Behavioral Therapy   Larose Kells 07/23/2022  3:16 PM

## 2022-07-23 NOTE — Progress Notes (Signed)
[facility] MD Progress Note  07/23/2022 5:10 PM Travis Sandoval  MRN:  [id number] Subjective: Follow-up patient was schizophrenia with no new complaints. Principal Problem: Schizophrenia Diagnosis: Principal Problem:   Schizophrenia Active Problems:   Psychosis  Total Time spent with patient: 15 minutes  Past Psychiatric History: Past history of schizophrenia  Past Medical History:  Past Medical History:  Diagnosis Date   ADHD    Bipolar 1 disorder ([place])    Hepatitis C    Schizoaffective disorder ([name])    History reviewed. No pertinent surgical history. Family History:  Family History  Family history unknown: Yes   Family Psychiatric  History: See previous Social History:  Social History   Substance and Sexual Activity  Alcohol Use Not Currently     Social History   Substance and Sexual Activity  Drug Use Not Currently    Social History   Socioeconomic History   Marital status: Unknown    Spouse name: Not on file   Number of children: Not on file   Years of education: Not on file   Highest education level: Not on file  Occupational History   Not on file  Tobacco Use   Smoking status: Every Day    Packs/day: 0.25    Years: 15.00    Additional pack years: 0.00    Total pack years: 3.75    Types: Cigarettes   Smokeless tobacco: Not on file  Vaping Use   Vaping Use: Not on file  Substance and Sexual Activity   Alcohol use: Not Currently   Drug use: Not Currently   Sexual activity: Not Currently  Other Topics Concern   Not on file  Social History Narrative   Not on file   Social Determinants of Health   Financial Resource Strain: Not on file  Food Insecurity: Patient Declined (05/18/2022)   Hunger Vital Sign    Worried About Running Out of Food in the Last Year: Patient declined    [place] in the Last Year: Patient declined  Transportation Needs: Patient Declined (05/18/2022)   [place] - Hydrologist (Medical): Patient  declined    Lack of Transportation (Non-Medical): Patient declined  Physical Activity: Not on file  Stress: Not on file  Social Connections: Not on file   Additional Social History:                         Sleep: Fair  Appetite:  Fair  Current Medications: Current Facility-Administered Medications  Medication Dose Route Frequency Provider Last Rate Last Admin   acetaminophen (TYLENOL) tablet 650 mg  650 mg Oral Q6H PRN [name], NP       alum & mag hydroxide-simeth (MAALOX/MYLANTA) 200-200-20 MG/5ML suspension 30 mL  30 mL Oral Q4H PRN [name], NP   30 mL at 07/10/22 2153   ARIPiprazole ER (ABILIFY MAINTENA) injection 400 mg  400 mg Intramuscular Q28 days Tyre Beaver T, MD   400 mg at 06/27/22 1701   cloZAPine (CLOZARIL) tablet 350 mg  350 mg Oral QHS Standley Bargo T, MD   350 mg at 07/22/22 2108   docusate sodium (COLACE) capsule 200 mg  200 mg Oral BID Saturnino Liew T, MD   200 mg at 07/20/22 1740   famotidine (PEPCID) tablet 20 mg  20 mg Oral BID Cheskel Silverio T, MD   20 mg at 07/20/22 1740   hydrOXYzine (ATARAX) tablet 50 mg  50 mg Oral Q6H PRN Ruben Pyka T, MD   50 mg at 07/20/22 2102   magnesium citrate solution 1 Bottle  1 Bottle Oral Daily PRN Wynelle Cleveland, RPH   1 Bottle at 07/19/22 1643   magnesium hydroxide (MILK OF MAGNESIA) suspension 30 mL  30 mL Oral Daily PRN Wynelle Cleveland, RPH   30 mL at 07/18/22 1718   mineral oil liquid   Oral Daily PRN Parks Ranger, DO   Given at 07/17/22 1201   nicotine polacrilex (NICORETTE) gum 2 mg  2 mg Oral PRN Lenna Hagarty, [name], MD   2 mg at 04/23/22 2200   polyethylene glycol (MIRALAX / GLYCOLAX) packet 17 g  17 g Oral Daily Norita Meigs, [name], MD   17 g at 07/23/22 0805   senna-docusate (Senokot-S) tablet 2 tablet  2 tablet Oral Daily PRN Tayshon Winker, [name], MD   2 tablet at 07/14/22 0809   ziprasidone (GEODON) injection 20 mg  20 mg Intramuscular Q12H PRN [name], MD        Lab  Results: No results found for this or any previous visit (from the past 29 hour(s)).  Blood Alcohol level:  Lab Results  Component Value Date   ETH <10 0000000    Metabolic Disorder Labs: Lab Results  Component Value Date   HGBA1C 5.5 04/24/2022   MPG 111 04/24/2022   No results found for: "PROLACTIN" Lab Results  Component Value Date   CHOL 220 (H) 04/24/2022   TRIG 700 (H) 04/24/2022   HDL 35 (L) 04/24/2022   CHOLHDL 6.3 04/24/2022   VLDL UNABLE TO CALCULATE IF TRIGLYCERIDE OVER 400 mg/dL 04/24/2022   LDLCALC UNABLE TO CALCULATE IF TRIGLYCERIDE OVER 400 mg/dL 04/24/2022    Physical Findings: AIMS: Facial and Oral Movements Muscles of Facial Expression: None, normal Lips and Perioral Area: None, normal Jaw: None, normal Tongue: None, normal,Extremity Movements Upper (arms, wrists, hands, fingers): None, normal Lower (legs, knees, ankles, toes): None, normal, Trunk Movements Neck, shoulders, hips: None, normal, Overall Severity Severity of abnormal movements (highest score from questions above): None, normal Incapacitation due to abnormal movements: None, normal Patient's awareness of abnormal movements (rate only patient's report): No Awareness, Dental Status Current problems with teeth and/or dentures?: No Does patient usually wear dentures?: No  CIWA:    COWS:     Musculoskeletal: Strength & Muscle Tone: within normal limits Gait & Station: normal Patient leans: N/A  Psychiatric Specialty Exam:  Presentation  General Appearance:  Disheveled  Eye Contact: Minimal  Speech: Pressured  Speech Volume: Normal  Handedness: Right   Mood and Affect  Mood: Euphoric; Irritable  Affect: Inappropriate   Thought Process  Thought Processes: Disorganized  Descriptions of Associations:Loose  Orientation:Partial  Thought Content:Illogical; Scattered  History of Schizophrenia/Schizoaffective disorder:No data recorded Duration of Psychotic  Symptoms:No data recorded Hallucinations:No data recorded Ideas of Reference:Delusions; Paranoia  Suicidal Thoughts:No data recorded Homicidal Thoughts:No data recorded  Sensorium  Memory: Immediate Poor; Recent Poor; Remote Poor  Judgment: Poor  Insight: Poor   Executive Functions  Concentration: Fair  Attention Span: Fair  Recall: Poor  Fund of Knowledge: Poor  Language: Poor   Psychomotor Activity  Psychomotor Activity:No data recorded  Assets  Assets: Desire for Improvement; Social Support   Sleep  Sleep:No data recorded   Physical Exam: Physical Exam Vitals and nursing note reviewed.  Constitutional:      Appearance: Normal appearance.  HENT:     Head: Normocephalic and atraumatic.  Mouth/Throat:     Pharynx: Oropharynx is clear.  Eyes:     Pupils: Pupils are equal, round, and reactive to light.  Cardiovascular:     Rate and Rhythm: Normal rate and regular rhythm.  Pulmonary:     Effort: Pulmonary effort is normal.     Breath sounds: Normal breath sounds.  Abdominal:     General: Abdomen is flat.     Palpations: Abdomen is soft.  Musculoskeletal:        General: Normal range of motion.  Skin:    General: Skin is warm and dry.  Neurological:     General: No focal deficit present.     Mental Status: He is alert. Mental status is at baseline.  Psychiatric:        Mood and Affect: Mood normal.        Thought Content: Thought content normal.    Review of Systems  Constitutional: Negative.   HENT: Negative.    Eyes: Negative.   Respiratory: Negative.    Cardiovascular: Negative.   Gastrointestinal: Negative.   Musculoskeletal: Negative.   Skin: Negative.   Neurological: Negative.   Psychiatric/Behavioral: Negative.     Blood pressure 112/80, pulse (!) 107, temperature 97.7 F (36.5 C), temperature source Oral, resp. rate 18, height 5\' 9"  (1.753 m), weight 96.4 kg, SpO2 96 %. Body mass index is 31.38 kg/m.   Treatment  Plan Summary: Medication management and Plan no change to medication.  We are just waiting on discharge planning  Alethia Berthold, MD 07/23/2022, 5:10 PM

## 2022-07-23 NOTE — Plan of Care (Signed)
Patient alert and oriented. Patient presented in a pleasant mood on assessment stating that he slept "good" last night and had no complaints to voice to this Probation officer. Patient denied SI, HI, AVH, and pain at this time, but has been observed several times talking to himself in his room, upon doing safety rounds. Patient also denied any signs/symptoms of depression and anxiety. Patient had no stated goals for today.   A- Some of scheduled medications administered to patient, per MD orders. Patient only wants to take Miralax. Support and encouragement provided.  Routine safety checks conducted every 15 minutes.  Patient informed to notify staff with problems or concerns.   R- No adverse drug reactions noted. Patient contracts for safety at this time. Patient compliant with some medications and treatment plan. Patient receptive, calm, and cooperative. Patient isolates to room, excepts for meals and medication. Patient remains safe at this time.   Problem: Education: Goal: Knowledge of General Education information will improve Description: Including pain rating scale, medication(s)/side effects and non-pharmacologic comfort measures Outcome: Progressing   Problem: Health Behavior/Discharge Planning: Goal: Ability to manage health-related needs will improve Outcome: Progressing   Problem: Clinical Measurements: Goal: Ability to maintain clinical measurements within normal limits will improve Outcome: Progressing Goal: Will remain free from infection Outcome: Progressing Goal: Diagnostic test results will improve Outcome: Progressing Goal: Respiratory complications will improve Outcome: Progressing Goal: Cardiovascular complication will be avoided Outcome: Progressing   Problem: Activity: Goal: Risk for activity intolerance will decrease Outcome: Progressing   Problem: Nutrition: Goal: Adequate nutrition will be maintained Outcome: Progressing   Problem: Coping: Goal: Level of anxiety  will decrease Outcome: Progressing   Problem: Elimination: Goal: Will not experience complications related to urinary retention Outcome: Progressing   Problem: Pain Managment: Goal: General experience of comfort will improve Outcome: Progressing   Problem: Safety: Goal: Ability to remain free from injury will improve Outcome: Progressing   Problem: Skin Integrity: Goal: Risk for impaired skin integrity will decrease Outcome: Progressing   Problem: Education: Goal: Knowledge of  General Education information/materials will improve Outcome: Progressing Goal: Emotional status will improve Outcome: Progressing Goal: Mental status will improve Outcome: Progressing Goal: Verbalization of understanding the information provided will improve Outcome: Progressing   Problem: Activity: Goal: Interest or engagement in activities will improve Outcome: Progressing Goal: Sleeping patterns will improve Outcome: Progressing   Problem: Coping: Goal: Ability to verbalize frustrations and anger appropriately will improve Outcome: Progressing Goal: Ability to demonstrate self-control will improve Outcome: Progressing   Problem: Health Behavior/Discharge Planning: Goal: Identification of resources available to assist in meeting health care needs will improve Outcome: Progressing Goal: Compliance with treatment plan for underlying cause of condition will improve Outcome: Progressing   Problem: Physical Regulation: Goal: Ability to maintain clinical measurements within normal limits will improve Outcome: Progressing   Problem: Safety: Goal: Periods of time without injury will increase Outcome: Progressing   Problem: Activity: Goal: Will verbalize the importance of balancing activity with adequate rest periods Outcome: Progressing   Problem: Education: Goal: Will be free of psychotic symptoms Outcome: Progressing Goal: Knowledge of the prescribed therapeutic regimen  will improve Outcome: Progressing   Problem: Coping: Goal: Coping ability will improve Outcome: Progressing Goal: Will verbalize feelings Outcome: Progressing   Problem: Health Behavior/Discharge Planning: Goal: Compliance with prescribed medication regimen will improve Outcome: Progressing   Problem: Nutritional: Goal: Ability to achieve adequate nutritional intake will improve Outcome: Progressing   Problem: Role Relationship: Goal: Ability to communicate needs accurately will improve  Outcome: Progressing Goal: Ability to interact with others will improve Outcome: Progressing   Problem: Safety: Goal: Ability to redirect hostility and anger into socially appropriate behaviors will improve Outcome: Progressing Goal: Ability to remain free from injury will improve Outcome: Progressing   Problem: Self-Care: Goal: Ability to participate in self-care as condition permits will improve Outcome: Progressing   Problem: Self-Concept: Goal: Will verbalize positive feelings about self Outcome: Progressing

## 2022-07-23 NOTE — Group Note (Signed)
Recreation Therapy Group Note   Group Topic:Coping Skills  Group Date: 07/23/2022 Start Time: 1000 End Time: 1040 Facilitators: Vilma Prader, LRT, CTRS Location:  Dayroom  Group Description: Mind Map.  Patient was provided a blank template of a diagram with 32 blank boxes in a tiered system, branching from the center (similar to a bubble chart). LRT directed patients to label the middle of the diagram "Coping Skills". LRT and patients then came up with 8 different coping skills as examples. Pt were directed to record their coping skills in the 2nd tier boxes closest to the center.  Patients would then share their coping skills with the group as LRT wrote them out. LRT gave a handout of 100 different coping skills at the end of group.   Affect/Mood: N/A   Participation Level: Did not attend    Clinical Observations/Individualized Feedback: Travis Sandoval did not attend group due to resting in his room.  Plan: Continue to engage patient in RT group sessions 2-3x/week.   Vilma Prader, LRT, CTRS 07/23/2022 11:10 AM

## 2022-07-23 NOTE — [facility] Group Notes (Signed)
[place] Group Notes:  (Nursing/MHT/Case Management/Adjunct)  Date:  07/23/2022  Time:  5:43 AM  Type of Therapy:  Group Therapy  Participation Level:  Active  Participation Quality:  Appropriate  Affect:  Appropriate  Cognitive:  Appropriate  Insight:  Appropriate  Engagement in Group:  Engaged  Modes of Intervention:  Socialization  Summary of Progress/Problems: This was a wrap up group held on 07/22/22  Travis Sandoval 07/23/2022, 5:43 AM

## 2022-07-24 LAB — CBC WITH DIFFERENTIAL/PLATELET
Abs Immature Granulocytes: 0.03 10*3/uL (ref 0.00–0.07)
Basophils Absolute: 0.1 10*3/uL (ref 0.0–0.1)
Basophils Relative: 1 %
Eosinophils Absolute: 0.2 10*3/uL (ref 0.0–0.5)
Eosinophils Relative: 2 %
HCT: 42.5 % (ref 39.0–52.0)
Hemoglobin: 14.3 g/dL (ref 13.0–17.0)
Immature Granulocytes: 0 %
Lymphocytes Relative: 33 %
Lymphs Abs: 2.4 10*3/uL (ref 0.7–4.0)
MCH: 29.1 pg (ref 26.0–34.0)
MCHC: 33.6 g/dL (ref 30.0–36.0)
MCV: 86.4 fL (ref 80.0–100.0)
Monocytes Absolute: 0.5 10*3/uL (ref 0.1–1.0)
Monocytes Relative: 7 %
Neutro Abs: 4.2 10*3/uL (ref 1.7–7.7)
Neutrophils Relative %: 57 %
Platelets: 233 10*3/uL (ref 150–400)
RBC: 4.92 MIL/uL (ref 4.22–5.81)
RDW: 12.5 % (ref 11.5–15.5)
WBC: 7.3 10*3/uL (ref 4.0–10.5)
nRBC: 0 % (ref 0.0–0.2)

## 2022-07-24 NOTE — Progress Notes (Signed)
Patient calm and pleasant during assessment denying SI/HI/AVH. Pt compliant with medication administration per MD orders. Pt given education, support, and encouragement to be active in his treatment plan. Pt being monitored Q 15 minutes for safety per unit protocol, remains safe on the unit  

## 2022-07-24 NOTE — Group Note (Signed)
Date:  07/24/2022 Time:  9:53 PM  Group Topic/Focus:  Wrap-Up Group:   The focus of this group is to help patients review their daily goal of treatment and discuss progress on daily workbooks.    Participation Level:  Active  Participation Quality:  Appropriate and Attentive  Affect:  Appropriate  Cognitive:  Alert and Appropriate  Insight: Good  Engagement in Group:  Developing/Improving  Modes of Intervention:  Clarification, Discussion, and Education  Additional Comments:      Travis Sandoval 07/24/2022, 9:53 PM

## 2022-07-24 NOTE — Plan of Care (Signed)
  Problem: Education: Goal: Knowledge of General Education information will improve Description: Including pain rating scale, medication(s)/side effects and non-pharmacologic comfort measures Outcome: Progressing   Problem: Health Behavior/Discharge Planning: Goal: Ability to manage health-related needs will improve Outcome: Progressing   Problem: Clinical Measurements: Goal: Ability to maintain clinical measurements within normal limits will improve Outcome: Progressing Goal: Will remain free from infection Outcome: Progressing Goal: Diagnostic test results will improve Outcome: Progressing Goal: Respiratory complications will improve Outcome: Progressing Goal: Cardiovascular complication will be avoided Outcome: Progressing   Problem: Activity: Goal: Risk for activity intolerance will decrease Outcome: Progressing   Problem: Nutrition: Goal: Adequate nutrition will be maintained Outcome: Progressing   Problem: Coping: Goal: Level of anxiety will decrease Outcome: Progressing   Problem: Elimination: Goal: Will not experience complications related to urinary retention Outcome: Progressing   Problem: Pain Managment: Goal: General experience of comfort will improve Outcome: Progressing   Problem: Safety: Goal: Ability to remain free from injury will improve Outcome: Progressing   Problem: Skin Integrity: Goal: Risk for impaired skin integrity will decrease Outcome: Progressing   Problem: Education: Goal: Knowledge of [place] Education information/materials will improve Outcome: Progressing Goal: Emotional status will improve Outcome: Progressing Goal: Mental status will improve Outcome: Progressing Goal: Verbalization of understanding the information provided will improve Outcome: Progressing   Problem: Activity: Goal: Interest or engagement in activities will improve Outcome: Progressing Goal: Sleeping patterns will improve Outcome:  Progressing   Problem: Coping: Goal: Ability to verbalize frustrations and anger appropriately will improve Outcome: Progressing Goal: Ability to demonstrate self-control will improve Outcome: Progressing   Problem: Health Behavior/Discharge Planning: Goal: Identification of resources available to assist in meeting health care needs will improve Outcome: Progressing Goal: Compliance with treatment plan for underlying cause of condition will improve Outcome: Progressing   Problem: Physical Regulation: Goal: Ability to maintain clinical measurements within normal limits will improve Outcome: Progressing   Problem: Safety: Goal: Periods of time without injury will increase Outcome: Progressing   Problem: Activity: Goal: Will verbalize the importance of balancing activity with adequate rest periods Outcome: Progressing   Problem: Education: Goal: Will be free of psychotic symptoms Outcome: Progressing Goal: Knowledge of the prescribed therapeutic regimen will improve Outcome: Progressing   Problem: Coping: Goal: Coping ability will improve Outcome: Progressing Goal: Will verbalize feelings Outcome: Progressing   Problem: Health Behavior/Discharge Planning: Goal: Compliance with prescribed medication regimen will improve Outcome: Progressing   Problem: Nutritional: Goal: Ability to achieve adequate nutritional intake will improve Outcome: Progressing   Problem: Role Relationship: Goal: Ability to communicate needs accurately will improve Outcome: Progressing Goal: Ability to interact with others will improve Outcome: Progressing   Problem: Safety: Goal: Ability to redirect hostility and anger into socially appropriate behaviors will improve Outcome: Progressing Goal: Ability to remain free from injury will improve Outcome: Progressing   Problem: Self-Care: Goal: Ability to participate in self-care as condition permits will improve Outcome: Progressing    Problem: Self-Concept: Goal: Will verbalize positive feelings about self Outcome: Progressing   

## 2022-07-24 NOTE — Consult Note (Signed)
Pharmacy Consult - Clozapine     42 yo male  This patient's order has been reviewed for prescribing contraindications.    Clozapine [facility] enrollment Verified: yes on 04/17/22 [facility] patient ID: [id number] Current Outpatient Monitoring: Every 2 weeks   Home Regimen:  225 mg po BID Last dose: unknown   Dose Adjustments This Admission: Dose started at clozapine 100 mg po daily Dose is currently clozapine 350 mg po HS (started 3/12)   Labs: Date    ANC    Submitted? 12/27 2800 Yes 01/03 3500 Yes 01/10   3100 Yes 01/24 3000 Yes 01/31 4400 yes   02/07 4300 yes 02/14 4500 Yes 02/21 5500 Yes 02/28 4080 Yes 03/06 4300 Yes  03/13 4100 Yes 03/20 4800 Yes 03/27 4800 Yes 04/03 4200 Yes  Plan: Continue clozapine 350 mg po HS Monitor ANC at least weekly while inpatient    Dallie Piles, PharmD, BCPS Clinical Pharmacist   07/24/2022 10:25 AM

## 2022-07-24 NOTE — Group Note (Signed)
Date:  07/24/2022 Time:  12:17 AM  Group Topic/Focus:  Wrap-Up Group:   The focus of this group is to help patients review their daily goal of treatment and discuss progress on daily workbooks.    Participation Level:  Active  Participation Quality:  Appropriate and Attentive  Affect:  Appropriate  Cognitive:  Alert and Appropriate  Insight: Good  Engagement in Group:  Improving  Modes of Intervention:  Support  Additional Comments:   Did not have any goal  Travis Sandoval 07/24/2022, 12:17 AM

## 2022-07-24 NOTE — Progress Notes (Signed)
Uneventful night. Requested maalox for indigestion. Given with good relief. Out of room for medication and snacks. Pleasant and cooperative. No interaction with peers. Minimal with staff. Denies SI, HI, AVH. Slept well this shift. Encouragement and support provided. Safety checks maintained. Meds given as prescribed. Pt remains safe on unit with q 15 min checks.

## 2022-07-24 NOTE — Group Note (Signed)
[facility] LCSW Group Therapy Note   Group Date: 07/24/2022 Start Time: 1300 End Time: 1400   Type of Therapy/Topic:  Group Therapy:  Emotion Regulation  Participation Level:  Did Not Attend    Description of Group:    The purpose of this group is to assist patients in learning to regulate negative emotions and experience positive emotions. Patients will be guided to discuss ways in which they have been vulnerable to their negative emotions. These vulnerabilities will be juxtaposed with experiences of positive emotions or situations, and patients challenged to use positive emotions to combat negative ones. Special emphasis will be placed on coping with negative emotions in conflict situations, and patients will process healthy conflict resolution skills.  Therapeutic Goals: Patient will identify two positive emotions or experiences to reflect on in order to balance out negative emotions:  Patient will label two or more emotions that they find the most difficult to experience:  Patient will be able to demonstrate positive conflict resolution skills through discussion or role plays:   Summary of Patient Progress: X   Therapeutic Modalities:   Cognitive Behavioral Therapy Feelings Identification Dialectical Behavioral Therapy   Shirl Harris, LCSW

## 2022-07-24 NOTE — Progress Notes (Signed)
[facility] MD Progress Note  07/24/2022 4:06 PM Travis Sandoval  MRN:  [id number] Subjective: Follow-up patient was schizophrenia with no new complaints. Principal Problem: Schizophrenia Diagnosis: Principal Problem:   Schizophrenia Active Problems:   Psychosis  Total Time spent with patient: 15 minutes  Past Psychiatric History: Past history of schizophrenia  Past Medical History:  Past Medical History:  Diagnosis Date   ADHD    Bipolar 1 disorder ([place])    Hepatitis C    Schizoaffective disorder ([place])    History reviewed. No pertinent surgical history. Family History:  Family History  Family history unknown: Yes   Family Psychiatric  History: See previous Social History:  Social History   Substance and Sexual Activity  Alcohol Use Not Currently     Social History   Substance and Sexual Activity  Drug Use Not Currently    Social History   Socioeconomic History   Marital status: Unknown    Spouse name: Not on file   Number of children: Not on file   Years of education: Not on file   Highest education level: Not on file  Occupational History   Not on file  Tobacco Use   Smoking status: Every Day    Packs/day: 0.25    Years: 15.00    Additional pack years: 0.00    Total pack years: 3.75    Types: Cigarettes   Smokeless tobacco: Not on file  Vaping Use   Vaping Use: Not on file  Substance and Sexual Activity   Alcohol use: Not Currently   Drug use: Not Currently   Sexual activity: Not Currently  Other Topics Concern   Not on file  Social History Narrative   Not on file   Social Determinants of Health   Financial Resource Strain: Not on file  Food Insecurity: Patient Declined (05/18/2022)   Hunger Vital Sign    Worried About Running Out of Food in the Last Year: Patient declined    [place] in the Last Year: Patient declined  Transportation Needs: Patient Declined (05/18/2022)   [place] - Hydrologist (Medical): Patient  declined    Lack of Transportation (Non-Medical): Patient declined  Physical Activity: Not on file  Stress: Not on file  Social Connections: Not on file   Additional Social History:                         Sleep: Fair  Appetite:  Fair  Current Medications: Current Facility-Administered Medications  Medication Dose Route Frequency Provider Last Rate Last Admin   acetaminophen (TYLENOL) tablet 650 mg  650 mg Oral Q6H PRN [name], NP       alum & mag hydroxide-simeth (MAALOX/MYLANTA) 200-200-20 MG/5ML suspension 30 mL  30 mL Oral Q4H PRN [name], NP   30 mL at 07/23/22 2114   ARIPiprazole ER (ABILIFY MAINTENA) injection 400 mg  400 mg Intramuscular Q28 days Ismar Yabut T, MD   400 mg at 06/27/22 1701   cloZAPine (CLOZARIL) tablet 350 mg  350 mg Oral QHS Bj Morlock T, MD   350 mg at 07/23/22 2043   docusate sodium (COLACE) capsule 200 mg  200 mg Oral BID Clell Trahan T, MD   200 mg at 07/24/22 0959   famotidine (PEPCID) tablet 20 mg  20 mg Oral BID Kennedey Digilio T, MD   20 mg at 07/20/22 1740   hydrOXYzine (ATARAX) tablet 50 mg  50 mg Oral Q6H PRN Hartley Urton, [name], MD   50 mg at 07/20/22 2102   magnesium citrate solution 1 Bottle  1 Bottle Oral Daily PRN Wynelle Cleveland, RPH   1 Bottle at 07/19/22 1643   magnesium hydroxide (MILK OF MAGNESIA) suspension 30 mL  30 mL Oral Daily PRN Wynelle Cleveland, RPH   30 mL at 07/18/22 1718   mineral oil liquid   Oral Daily PRN Parks Ranger, DO   Given at 07/17/22 1201   nicotine polacrilex (NICORETTE) gum 2 mg  2 mg Oral PRN Cinthia Rodden, [name], MD   2 mg at 04/23/22 2200   polyethylene glycol (MIRALAX / GLYCOLAX) packet 17 g  17 g Oral Daily Cassi Jenne, [name], MD   17 g at 07/24/22 1004   senna-docusate (Senokot-S) tablet 2 tablet  2 tablet Oral Daily PRN Eion Timbrook, [name], MD   2 tablet at 07/14/22 0809   ziprasidone (GEODON) injection 20 mg  20 mg Intramuscular Q12H PRN [name], MD        Lab  Results:  Results for orders placed or performed during the hospital encounter of 04/16/22 (from the past 48 hour(s))  CBC with Differential/Platelet     Status: None   Collection Time: 07/24/22 10:01 AM  Result Value Ref Range   WBC 7.3 4.0 - 10.5 K/uL   RBC 4.92 4.22 - 5.81 MIL/uL   Hemoglobin 14.3 13.0 - 17.0 g/dL   HCT 42.5 39.0 - 52.0 %   MCV 86.4 80.0 - 100.0 fL   MCH 29.1 26.0 - 34.0 pg   MCHC 33.6 30.0 - 36.0 g/dL   RDW 12.5 11.5 - 15.5 %   Platelets 233 150 - 400 K/uL   nRBC 0.0 0.0 - 0.2 %   Neutrophils Relative % 57 %   Neutro Abs 4.2 1.7 - 7.7 K/uL   Lymphocytes Relative 33 %   Lymphs Abs 2.4 0.7 - 4.0 K/uL   Monocytes Relative 7 %   Monocytes Absolute 0.5 0.1 - 1.0 K/uL   Eosinophils Relative 2 %   Eosinophils Absolute 0.2 0.0 - 0.5 K/uL   Basophils Relative 1 %   Basophils Absolute 0.1 0.0 - 0.1 K/uL   Immature Granulocytes 0 %   Abs Immature Granulocytes 0.03 0.00 - 0.07 K/uL    Comment: Performed at [facility], [address] [place], [place] [address]    Blood Alcohol level:  Lab Results  Component Value Date   [facility] <10 0000000    Metabolic Disorder Labs: Lab Results  Component Value Date   HGBA1C 5.5 04/24/2022   MPG 111 04/24/2022   No results found for: "PROLACTIN" Lab Results  Component Value Date   CHOL 220 (H) 04/24/2022   TRIG 700 (H) 04/24/2022   HDL 35 (L) 04/24/2022   CHOLHDL 6.3 04/24/2022   VLDL UNABLE TO CALCULATE IF TRIGLYCERIDE OVER 400 mg/dL 04/24/2022   LDLCALC UNABLE TO CALCULATE IF TRIGLYCERIDE OVER 400 mg/dL 04/24/2022    Physical Findings: AIMS: Facial and Oral Movements Muscles of Facial Expression: None, normal Lips and Perioral Area: None, normal Jaw: None, normal Tongue: None, normal,Extremity Movements Upper (arms, wrists, hands, fingers): None, normal Lower (legs, knees, ankles, toes): None, normal, Trunk Movements Neck, shoulders, hips: None, normal, Overall Severity Severity of abnormal  movements (highest score from questions above): None, normal Incapacitation due to abnormal movements: None, normal Patient's awareness of abnormal movements (rate only patient's report): No Awareness, Dental Status Current  problems with teeth and/or dentures?: No Does patient usually wear dentures?: No  CIWA:    COWS:     Musculoskeletal: Strength & Muscle Tone: within normal limits Gait & Station: normal Patient leans: N/A  Psychiatric Specialty Exam:  Presentation  General Appearance:  Disheveled  Eye Contact: Minimal  Speech: Pressured  Speech Volume: Normal  Handedness: Right   Mood and Affect  Mood: Euphoric; Irritable  Affect: Inappropriate   Thought Process  Thought Processes: Disorganized  Descriptions of Associations:Loose  Orientation:Partial  Thought Content:Illogical; Scattered  History of Schizophrenia/Schizoaffective disorder:No data recorded Duration of Psychotic Symptoms:No data recorded Hallucinations:No data recorded Ideas of Reference:Delusions; Paranoia  Suicidal Thoughts:No data recorded Homicidal Thoughts:No data recorded  Sensorium  Memory: Immediate Poor; Recent Poor; Remote Poor  Judgment: Poor  Insight: Poor   Executive Functions  Concentration: Fair  Attention Span: Fair  Recall: Poor  Fund of Knowledge: Poor  Language: Poor   Psychomotor Activity  Psychomotor Activity:No data recorded  Assets  Assets: Desire for Improvement; Social Support   Sleep  Sleep:No data recorded   Physical Exam: Physical Exam Vitals and nursing note reviewed.  Constitutional:      Appearance: Normal appearance.  HENT:     Head: Normocephalic and atraumatic.     Mouth/Throat:     Pharynx: Oropharynx is clear.  Eyes:     Pupils: Pupils are equal, round, and reactive to light.  Cardiovascular:     Rate and Rhythm: Normal rate and regular rhythm.  Pulmonary:     Effort: Pulmonary effort is normal.      Breath sounds: Normal breath sounds.  Abdominal:     General: Abdomen is flat.     Palpations: Abdomen is soft.  Musculoskeletal:        General: Normal range of motion.  Skin:    General: Skin is warm and dry.  Neurological:     General: No focal deficit present.     Mental Status: He is alert. Mental status is at baseline.  Psychiatric:        Mood and Affect: Mood normal.        Thought Content: Thought content normal.    Review of Systems  Constitutional: Negative.   HENT: Negative.    Eyes: Negative.   Respiratory: Negative.    Cardiovascular: Negative.   Gastrointestinal: Negative.   Musculoskeletal: Negative.   Skin: Negative.   Neurological: Negative.   Psychiatric/Behavioral: Negative.     Blood pressure 122/74, pulse 99, temperature 98 F (36.7 C), temperature source Oral, resp. rate 18, height 5\' 9"  (1.753 m), weight 96.4 kg, SpO2 97 %. Body mass index is 31.38 kg/m.   Treatment Plan Summary: Medication management and Plan no change to medication.  We are just waiting on discharge planning  Alethia Berthold, MD 07/24/2022, 4:06 PM

## 2022-07-24 NOTE — Progress Notes (Signed)
D- Patient alert and oriented x 2-3. Affect flat/mood pleasant. Denies SI/ HI/ AVH. He denies pain. Staes his goal today is for discharge. A- Scheduled medications administered to patient, per MD orders. Support and encouragement provided.  Routine safety checks conducted every 15 minutes.  Patient informed to notify staff with problems or concerns. R- No adverse drug reactions noted. Patient contracts for safety at this time. Patient compliant with medications and treatment plan. Patient receptive, calm, and cooperative. Patient isolates to room except for meals.  Patient remains safe on the unit at this time.

## 2022-07-24 NOTE — Group Note (Signed)
Date:  07/24/2022 Time:  6:13 PM  Group Topic/Focus:  Activity  , Outside group     Participation Level:  Active  Participation Quality:  Appropriate  Affect:  Appropriate  Cognitive:  Alert and Appropriate  Insight: Appropriate  Engagement in Group:  Engaged  Modes of Intervention:  Activity  Additional Comments:    Ladona Mow 07/24/2022, 6:13 PM

## 2022-07-24 NOTE — Group Note (Signed)
Date:  07/24/2022 Time:  11:26 AM  Group Topic/Focus:  Community Meeting    Participation Level:  Did Not Attend   Adela Lank [facility] 07/24/2022, 11:26 AM

## 2022-07-24 NOTE — Group Note (Signed)
Recreation Therapy Group Note   Group Topic:Problem Solving  Group Date: 07/24/2022 Start Time: 1000 End Time: 1100 Facilitators: Vilma Prader, LRT, CTRS Location:  Craft Room  Group Description: Life Boat. Patients were given the scenario that they are on a boat that is about to become shipwrecked, leaving them stranded on an [place]. They are asked to make a list of 15 different items that they want to take with them when they are stranded on the [place]. Patients are asked to rank their items from most important to least important, #1 being the most important and #15 being the least. Patients will work individually for the first round to come up with 15 items and then pair up with a peer(s) to condense their list and come up with one list of 15 items between the two of them. Patients or LRT will read aloud the 15 different items to the group after each round. LRT facilitated post-activity processing to discuss how this activity can be used in daily life post discharge.   Affect/Mood: N/A   Participation Level: Did not attend    Clinical Observations/Individualized Feedback: Travis Sandoval did not attend group due to resting in his room.  Plan: Continue to engage patient in RT group sessions 2-3x/week.   Vilma Prader, LRT, CTRS 07/24/2022 11:13 AM

## 2022-07-25 DIAGNOSIS — F203 Undifferentiated schizophrenia: Secondary | ICD-10-CM | POA: Diagnosis not present

## 2022-07-25 NOTE — [facility] Group Notes (Signed)
[facility] Group Notes:  (Nursing/MHT/Case Management/Adjunct)  Date:  07/25/2022  Time:  9:55 AM  Type of Therapy:   community meeting  Participation Level:  Did Not Attend     Travis Sandoval 07/25/2022, 9:55 AM

## 2022-07-25 NOTE — Plan of Care (Signed)
D: Patient alert and oriented. Patient denies pain. Patient denies anxiety and depression. Patient denies SI/HI/AVH. Patient refused scheduled 0800 and 1700 medication stating that he doesn't need it because he already had a bowel movement today.  A:  Q15 minute safety checks maintained.   R: Patient remains safe on the unit at this time. Problem: Education: Goal: Knowledge of [place] General Education information/materials will improve Outcome: Progressing Goal: Verbalization of understanding the information provided will improve Outcome: Progressing   Problem: Physical Regulation: Goal: Ability to maintain clinical measurements within normal limits will improve Outcome: Progressing   Problem: Safety: Goal: Periods of time without injury will increase Outcome: Progressing

## 2022-07-25 NOTE — Group Note (Signed)
Date:  07/25/2022 Time:  8:54 PM  Group Topic/Focus:  Wrap-Up Group:   The focus of this group is to help patients review their daily goal of treatment and discuss progress on daily workbooks.    Participation Level:  Active  Participation Quality:  Appropriate and Attentive  Affect:  Appropriate  Cognitive:  Alert and Appropriate  Insight: Good  Engagement in Group:  Improving  Modes of Intervention:  Clarification, Education, and Support  Additional Comments:     Jaelyne Deeg 07/25/2022, 8:54 PM

## 2022-07-25 NOTE — Progress Notes (Signed)
Patient is pleasant and cooperative. He has been isolative to his room except for snack. He received his QHS medication without incident. Denies having psychotic symptoms, but observed talking to himself in his room. No behavior or other issues observed.  Safe on the unit with q 15 minute safety checks.    C Butler-Nicholson, LPN

## 2022-07-25 NOTE — Group Note (Signed)
Recreation Therapy Group Note   Group Topic:Healthy Support Systems  Group Date: 07/25/2022 Start Time: 1000 End Time: 1110 Facilitators: Vilma Prader, LRT, CTRS Location:  Craft Room  Group Description: Straw Bridge. Patients were given 10 plastic drinking straws and an equal length of masking tape. Using the materials provided, patients were instructed to build a free-standing bridge-like structure to suspend an everyday item (ex: deck of cards) off the floor or table surface. All materials were required to be used in Conservation officer, nature. LRT facilitated post-activity discussion reviewing the importance of having strong and healthy support systems in our lives. LRT discussed how the people in our lives serve as the tape and the book we placed on top of our straw structure are the stressors we face in daily life. LRT and pts discussed what happens in our life when things get too heavy for [place], and we don't have strong supports outside of the hospital. Pt shared 2 of their healthy supports aloud in the group.   Affect/Mood: N/A   Participation Level: Did not attend    Clinical Observations/Individualized Feedback: Travis Sandoval did not attend group due to resting in his room.  Plan: Continue to engage patient in RT group sessions 2-3x/week.   Vilma Prader, LRT, CTRS 07/25/2022 11:23 AM

## 2022-07-25 NOTE — Progress Notes (Signed)
[facility] MD Progress Note  07/25/2022 2:05 PM Travis Sandoval  MRN:  [id number] Subjective: Follow-up patient with schizophrenia.  Unchanged.  No complaints.  Staying in bed pretty much all the time.  No dangerous behavior.  Appears to be lucid.  We are entirely waiting on progress with the money so we can look at discharge Principal Problem: Schizophrenia Diagnosis: Principal Problem:   Schizophrenia Active Problems:   Psychosis  Total Time spent with patient: 30 minutes  Past Psychiatric History: Past history of schizophrenia  Past Medical History:  Past Medical History:  Diagnosis Date   ADHD    Bipolar 1 disorder ([place])    Hepatitis C    Schizoaffective disorder ([place])    History reviewed. No pertinent surgical history. Family History:  Family History  Family history unknown: Yes   Family Psychiatric  History: See previous Social History:  Social History   Substance and Sexual Activity  Alcohol Use Not Currently     Social History   Substance and Sexual Activity  Drug Use Not Currently    Social History   Socioeconomic History   Marital status: Unknown    Spouse name: Not on file   Number of children: Not on file   Years of education: Not on file   Highest education level: Not on file  Occupational History   Not on file  Tobacco Use   Smoking status: Every Day    Packs/day: 0.25    Years: 15.00    Additional pack years: 0.00    Total pack years: 3.75    Types: Cigarettes   Smokeless tobacco: Not on file  Vaping Use   Vaping Use: Not on file  Substance and Sexual Activity   Alcohol use: Not Currently   Drug use: Not Currently   Sexual activity: Not Currently  Other Topics Concern   Not on file  Social History Narrative   Not on file   Social Determinants of Health   Financial Resource Strain: Not on file  Food Insecurity: Patient Declined (05/18/2022)   Hunger Vital Sign    Worried About Running Out of Food in the Last Year: Patient declined    [place] in the Last Year: Patient declined  Transportation Needs: Patient Declined (05/18/2022)   [employer] - Hydrologist (Medical): Patient declined    Lack of Transportation (Non-Medical): Patient declined  Physical Activity: Not on file  Stress: Not on file  Social Connections: Not on file   Additional Social History:                         Sleep: Fair  Appetite:  Fair  Current Medications: Current Facility-Administered Medications  Medication Dose Route Frequency Provider Last Rate Last Admin   acetaminophen (TYLENOL) tablet 650 mg  650 mg Oral Q6H PRN [name], NP       alum & mag hydroxide-simeth (MAALOX/MYLANTA) 200-200-20 MG/5ML suspension 30 mL  30 mL Oral Q4H PRN [name], NP   30 mL at 07/23/22 2114   ARIPiprazole ER (ABILIFY MAINTENA) injection 400 mg  400 mg Intramuscular Q28 days Wyoma Genson T, MD   400 mg at 06/27/22 1701   cloZAPine (CLOZARIL) tablet 350 mg  350 mg Oral QHS Tyra Gural T, MD   350 mg at 07/24/22 2059   docusate sodium (COLACE) capsule 200 mg  200 mg Oral BID Raheim Beutler, [name], MD   200  mg at 07/24/22 1908   famotidine (PEPCID) tablet 20 mg  20 mg Oral BID Sharaya Boruff, [name], MD   20 mg at 07/20/22 1740   hydrOXYzine (ATARAX) tablet 50 mg  50 mg Oral Q6H PRN Samaira Holzworth, [name], MD   50 mg at 07/20/22 2102   magnesium citrate solution 1 Bottle  1 Bottle Oral Daily PRN Wynelle Cleveland, RPH   1 Bottle at 07/19/22 1643   magnesium hydroxide (MILK OF MAGNESIA) suspension 30 mL  30 mL Oral Daily PRN Wynelle Cleveland, RPH   30 mL at 07/18/22 1718   mineral oil liquid   Oral Daily PRN Parks Ranger, DO   Given at 07/17/22 1201   nicotine polacrilex (NICORETTE) gum 2 mg  2 mg Oral PRN Eisley Barber, [name], MD   2 mg at 04/23/22 2200   polyethylene glycol (MIRALAX / GLYCOLAX) packet 17 g  17 g Oral Daily Meryem Haertel, [name], MD   17 g at 07/24/22 1004   senna-docusate (Senokot-S) tablet 2 tablet  2  tablet Oral Daily PRN Thadd Apuzzo, [name], MD   2 tablet at 07/14/22 0809   ziprasidone (GEODON) injection 20 mg  20 mg Intramuscular Q12H PRN [name], MD        Lab Results:  Results for orders placed or performed during the hospital encounter of 04/16/22 (from the past 48 hour(s))  CBC with Differential/Platelet     Status: None   Collection Time: 07/24/22 10:01 AM  Result Value Ref Range   WBC 7.3 4.0 - 10.5 K/uL   RBC 4.92 4.22 - 5.81 MIL/uL   Hemoglobin 14.3 13.0 - 17.0 g/dL   HCT 42.5 39.0 - 52.0 %   MCV 86.4 80.0 - 100.0 fL   MCH 29.1 26.0 - 34.0 pg   MCHC 33.6 30.0 - 36.0 g/dL   RDW 12.5 11.5 - 15.5 %   Platelets 233 150 - 400 K/uL   nRBC 0.0 0.0 - 0.2 %   Neutrophils Relative % 57 %   Neutro Abs 4.2 1.7 - 7.7 K/uL   Lymphocytes Relative 33 %   Lymphs Abs 2.4 0.7 - 4.0 K/uL   Monocytes Relative 7 %   Monocytes Absolute 0.5 0.1 - 1.0 K/uL   Eosinophils Relative 2 %   Eosinophils Absolute 0.2 0.0 - 0.5 K/uL   Basophils Relative 1 %   Basophils Absolute 0.1 0.0 - 0.1 K/uL   Immature Granulocytes 0 %   Abs Immature Granulocytes 0.03 0.00 - 0.07 K/uL    Comment: Performed at [facility], [address] [place], [place] [address]    Blood Alcohol level:  Lab Results  Component Value Date   [facility] <10 0000000    Metabolic Disorder Labs: Lab Results  Component Value Date   HGBA1C 5.5 04/24/2022   MPG 111 04/24/2022   No results found for: "PROLACTIN" Lab Results  Component Value Date   CHOL 220 (H) 04/24/2022   TRIG 700 (H) 04/24/2022   HDL 35 (L) 04/24/2022   CHOLHDL 6.3 04/24/2022   VLDL UNABLE TO CALCULATE IF TRIGLYCERIDE OVER 400 mg/dL 04/24/2022   LDLCALC UNABLE TO CALCULATE IF TRIGLYCERIDE OVER 400 mg/dL 04/24/2022    Physical Findings: AIMS: Facial and Oral Movements Muscles of Facial Expression: None, normal Lips and Perioral Area: None, normal Jaw: None, normal Tongue: None, normal,Extremity Movements Upper (arms, wrists, hands,  fingers): None, normal Lower (legs, knees, ankles, toes): None, normal, Trunk Movements Neck, shoulders, hips: None,  normal, Overall Severity Severity of abnormal movements (highest score from questions above): None, normal Incapacitation due to abnormal movements: None, normal Patient's awareness of abnormal movements (rate only patient's report): No Awareness, Dental Status Current problems with teeth and/or dentures?: No Does patient usually wear dentures?: No  CIWA:    COWS:     Musculoskeletal: Strength & Muscle Tone: within normal limits Gait & Station: normal Patient leans: N/A  Psychiatric Specialty Exam:  Presentation  General Appearance:  Disheveled  Eye Contact: Minimal  Speech: Pressured  Speech Volume: Normal  Handedness: Right   Mood and Affect  Mood: Euphoric; Irritable  Affect: Inappropriate   Thought Process  Thought Processes: Disorganized  Descriptions of Associations:Loose  Orientation:Partial  Thought Content:Illogical; Scattered  History of Schizophrenia/Schizoaffective disorder:No data recorded Duration of Psychotic Symptoms:No data recorded Hallucinations:No data recorded Ideas of Reference:Delusions; Paranoia  Suicidal Thoughts:No data recorded Homicidal Thoughts:No data recorded  Sensorium  Memory: Immediate Poor; Recent Poor; Remote Poor  Judgment: Poor  Insight: Poor   Executive Functions  Concentration: Fair  Attention Span: Fair  Recall: Poor  Fund of Knowledge: Poor  Language: Poor   Psychomotor Activity  Psychomotor Activity:No data recorded  Assets  Assets: Desire for Improvement; Social Support   Sleep  Sleep:No data recorded   Physical Exam: Physical Exam Vitals and nursing note reviewed.  Constitutional:      Appearance: Normal appearance.  HENT:     Head: Normocephalic and atraumatic.     Mouth/Throat:     Pharynx: Oropharynx is clear.  Eyes:     Pupils: Pupils are  equal, round, and reactive to light.  Cardiovascular:     Rate and Rhythm: Normal rate and regular rhythm.  Pulmonary:     Effort: Pulmonary effort is normal.     Breath sounds: Normal breath sounds.  Abdominal:     General: Abdomen is flat.     Palpations: Abdomen is soft.  Musculoskeletal:        General: Normal range of motion.  Skin:    General: Skin is warm and dry.  Neurological:     General: No focal deficit present.     Mental Status: He is alert. Mental status is at baseline.  Psychiatric:        Attention and Perception: Attention normal.        Mood and Affect: Mood normal.        Speech: Speech normal.        Behavior: Behavior normal.        Thought Content: Thought content normal.        Cognition and Memory: Cognition normal.        Judgment: Judgment normal.    Review of Systems  Constitutional: Negative.   HENT: Negative.    Eyes: Negative.   Respiratory: Negative.    Cardiovascular: Negative.   Gastrointestinal: Negative.   Musculoskeletal: Negative.   Skin: Negative.   Neurological: Negative.   Psychiatric/Behavioral: Negative.     Blood pressure 121/82, pulse (!) 102, temperature 97.7 F (36.5 C), temperature source Oral, resp. rate 18, height 5\' 9"  (1.753 m), weight 96.4 kg, SpO2 98 %. Body mass index is 31.38 kg/m.   Treatment Plan Summary: Medication management and Plan no change to current plan.  Waiting for discharge options and finances  Alethia Berthold, MD 07/25/2022, 2:05 PM

## 2022-07-26 NOTE — BH IP Treatment Plan (Signed)
Interdisciplinary Treatment and Diagnostic Plan Update  07/26/2022 Time of Session: 08:30 Travis DieselBranson Sandoval MRN: [id number]  Principal Diagnosis: Schizophrenia  Secondary Diagnoses: Principal Problem:   Schizophrenia Active Problems:   Psychosis   Current Medications:  Current Facility-Administered Medications  Medication Dose Route Frequency Provider Last Rate Last Admin   acetaminophen (TYLENOL) tablet 650 mg  650 mg Oral Q6H PRN Vanetta MuldersBarthold, Louise F, NP       alum & mag hydroxide-simeth (MAALOX/MYLANTA) 200-200-20 MG/5ML suspension 30 mL  30 mL Oral Q4H PRN Gabriel CirriBarthold, Louise F, NP   30 mL at 07/23/22 2114   ARIPiprazole ER (ABILIFY MAINTENA) injection 400 mg  400 mg Intramuscular Q28 days [name], [name], MD   400 mg at 07/25/22 1501   cloZAPine (CLOZARIL) tablet 350 mg  350 mg Oral QHS [name], [name], MD   350 mg at 07/25/22 2112   docusate sodium (COLACE) capsule 200 mg  200 mg Oral BID [name], [name], MD   200 mg at 07/24/22 1908   famotidine (PEPCID) tablet 20 mg  20 mg Oral BID [name], [name], MD   20 mg at 07/20/22 1740   hydrOXYzine (ATARAX) tablet 50 mg  50 mg Oral Q6H PRN [name], [name], MD   50 mg at 07/20/22 2102   magnesium citrate solution 1 Bottle  1 Bottle Oral Daily PRN Jaynie BreamChilds, Caroline A, RPH   1 Bottle at 07/19/22 1643   magnesium hydroxide (MILK OF MAGNESIA) suspension 30 mL  30 mL Oral Daily PRN Jaynie BreamChilds, Caroline A, RPH   30 mL at 07/18/22 1718   mineral oil liquid   Oral Daily PRN Sarina IllHerrick, Richard Edward, DO   Given at 07/17/22 1201   nicotine polacrilex (NICORETTE) gum 2 mg  2 mg Oral PRN [name], [name], MD   2 mg at 04/23/22 2200   polyethylene glycol (MIRALAX / GLYCOLAX) packet 17 g  17 g Oral Daily [name], [name], MD   17 g at 07/24/22 1004   senna-docusate (Senokot-S) tablet 2 tablet  2 tablet Oral Daily PRN [name], Jackquline DenmarkJohn T, MD   2 tablet at 07/14/22 0809   ziprasidone (GEODON) injection 20 mg  20 mg Intramuscular Q12H PRN Reggie PileJoshi, Anand, MD       PTA  Medications: Medications Prior to Admission  Medication Sig Dispense Refill Last Dose   ABILIFY MAINTENA 400 MG SRER injection Inject 400 mg into the muscle every 28 (twenty-eight) days.      ARIPiprazole (ABILIFY) 10 MG tablet Take 10 mg by mouth daily. (Patient not taking: Reported on 04/16/2022)      atomoxetine (STRATTERA) 40 MG capsule Take 40 mg by mouth daily. (Patient not taking: Reported on 04/16/2022)      atorvastatin (LIPITOR) 40 MG tablet Take 40 mg by mouth daily.      budesonide-formoterol (SYMBICORT) 160-4.5 MCG/ACT inhaler Inhale 2 puffs into the lungs.      cetirizine (ZYRTEC) 10 MG tablet Take 10 mg by mouth daily.      clozapine (CLOZARIL) 200 MG tablet Take 200 mg by mouth 2 (two) times daily. Take along with one 25 mg tablet for total 225 mg twice daily      cloZAPine (CLOZARIL) 25 MG tablet Take 25 mg by mouth 2 (two) times daily. Take along with one 200 mg tablet for total 225 mg twice daily      Dexlansoprazole (DEXILANT) 30 MG capsule Take 30 mg by mouth daily. (Patient not taking: Reported on 04/16/2022)  hydrOXYzine (VISTARIL) 50 MG capsule Take 50 mg by mouth 3 (three) times daily as needed for itching. (Patient not taking: Reported on 04/16/2022)      Melatonin 5 MG TABS Take 5 mg by mouth at bedtime as needed (sleep).      metoprolol succinate (TOPROL-XL) 25 MG 24 hr tablet Take 12.5 mg by mouth daily.      Phosphatidyl Choline 65 MG TABS Take 1 tablet by mouth daily.  (Patient not taking: Reported on 04/16/2022)      valproic acid (DEPAKENE) 250 MG/5ML solution Take 750-1,000 mLs by mouth 2 (two) times daily. 1000 mg (20 mL) every morning and 750 mg (15 mL) daily at bedtime       Patient Stressors: Other: Psychosis    Patient Strengths: Ability for insight  Active sense of humor  Average or above average intelligence  Capable of independent living  Supportive family/friends   Treatment Modalities: Medication Management, Group therapy, Case management,   1 to 1 session with clinician, Psychoeducation, Recreational therapy.   Physician Treatment Plan for Primary Diagnosis: Schizophrenia Long Term Goal(s):     Short Term Goals: None, pt is a poor hx.  Medication Management: Evaluate patient's response, side effects, and tolerance of medication regimen.  Therapeutic Interventions: 1 to 1 sessions, Unit Group sessions and Medication administration.  Evaluation of Outcomes: Progressing  Physician Treatment Plan for Secondary Diagnosis: Principal Problem:   Schizophrenia Active Problems:   Psychosis  Long Term Goal(s):     Short Term Goals: None, pt is a poor hx.     Medication Management: Evaluate patient's response, side effects, and tolerance of medication regimen.  Therapeutic Interventions: 1 to 1 sessions, Unit Group sessions and Medication administration.  Evaluation of Outcomes: Progressing   RN Treatment Plan for Primary Diagnosis: Schizophrenia Long Term Goal(s): Knowledge of disease and therapeutic regimen to maintain health will improve  Short Term Goals: Ability to remain free from injury will improve, Ability to verbalize frustration and anger appropriately will improve, Ability to demonstrate self-control, Ability to participate in decision making will improve, Ability to verbalize feelings will improve, Ability to disclose and discuss suicidal ideas, Ability to identify and develop effective coping behaviors will improve, and Compliance with prescribed medications will improve  Medication Management: RN will administer medications as ordered by provider, will assess and evaluate patient's response and provide education to patient for prescribed medication. RN will report any adverse and/or side effects to prescribing provider.  Therapeutic Interventions: 1 on 1 counseling sessions, Psychoeducation, Medication administration, Evaluate responses to treatment, Monitor vital signs and CBGs as ordered, Perform/monitor  CIWA, COWS, AIMS and Fall Risk screenings as ordered, Perform wound care treatments as ordered.  Evaluation of Outcomes: Progressing   LCSW Treatment Plan for Primary Diagnosis: Schizophrenia Long Term Goal(s): Safe transition to appropriate next level of care at discharge, Engage patient in therapeutic group addressing interpersonal concerns.  Short Term Goals: Engage patient in aftercare planning with referrals and resources, Increase social support, Increase ability to appropriately verbalize feelings, Increase emotional regulation, Facilitate acceptance of mental health diagnosis and concerns, and Increase skills for wellness and recovery  Therapeutic Interventions: Assess for all discharge needs, 1 to 1 time with Social worker, Explore available resources and support systems, Assess for adequacy in community support network, Educate family and significant other(s) on suicide prevention, Complete Psychosocial Assessment, Interpersonal group therapy.  Evaluation of Outcomes: Progressing   Progress in Treatment: Attending groups: No. Participating in groups: No. Taking medication as prescribed:  Yes. Toleration medication: Yes. Family/Significant other contact made: Yes, individual(s) contacted:  SPE completed with pt's father. Patient understands diagnosis: No. Discussing patient identified problems/goals with staff: Yes. Medical problems stabilized or resolved: Yes. Denies suicidal/homicidal ideation: Yes. Issues/concerns per patient self-inventory: No. Other: none.  New problem(s) identified: No, Describe:  none Update 06/30/2021:  No changes at this time. Update 07/06/2022:  No changes at this time. Update 07/11/22: No changes at this time. Update 07/16/22: none. Update 07/21/22: none. Update 07/26/22: No changes at this time.   New Short Term/Long Term Goal(s): elimination of symptoms of psychosis, medication management for mood stabilization; elimination of SI thoughts; development of  comprehensive mental wellness plan. Update 06/30/2021:  No changes at this time. Update 07/06/2022:  No changes at this time. Update 07/11/22: No changes at this time. Update 07/16/22: Patient to work towards elimination of symptoms of psychosis, medication management for mood stabilization; development of comprehensive mental wellness plan. Update 07/21/22: Patient to work towards elimination of symptoms of psychosis, medication management for mood stabilization; development of comprehensive mental wellness plan. Update 07/26/22: No changes at this time.   Patient Goals:  No additional treatment goals identified by patient. Update 06/30/2021:  No changes at this time. Update 07/06/2022:  No changes at this time. Update 07/11/22: No changes at this time. Update 07/16/22: No additional goals identified at this time. Patient to continue to work towards original goals identified in initial treatment team meeting. CSW will remain available to patient should they voice additional treatment goals. Update 07/21/22: No additional goals identified at this time. Patient to continue to work towards original goals identified in initial treatment team meeting. CSW will remain available to patient should they voice additional treatment goals. Update 07/26/22: No changes at this time.   Discharge Plan or Barriers: Patient remains on the unit as placement continues to be sought. Funding remains the concern at this time and biggest barrier to placement at this time. Update 06/30/2021:  No changes at this time. Update 07/06/2022:  No changes at this time. Update 07/11/22: No changes at this time. Update 07/16/22: Patient lacks funding for placement, [employer] rep continues to communicate with social security to restart payment. Patient has guardianship hearing on this day. Situation ongoing, CSW will continue to monitor and update note as more information becomes available. Update 07/21/22: Patient lacks adequate funding, CSW to continue pursing payor  source to secure placement. Patient is unable to living independently per psychiatrist. Update 07/26/22: No changes at this time.   Reason for Continuation of Hospitalization: Anxiety Delusions  Depression Hallucinations   Estimated Length of Stay: TBD Update 07/06/2022:  TBD Update 07/11/22: No changes at this time. Update 07/16/22: TBD. Update 07/21/22: TBD. Update 07/26/22: No changes at this time.  Last 3 [place] Suicide Severity Risk Score: Flowsheet Row Admission (Current) from 04/16/2022 in [facility]PATIENT BEH[facility]INE ED from 04/15/2022 in [facility] Emergency Department at [facility]  C-SSRS RISK CATEGORY No Risk No Risk       Last PHQ 2/9 Scores:     No data to display          Scribe for Treatment Team: Glenis Smoker, LCSW 07/26/2022 2:40 PM

## 2022-07-26 NOTE — Group Note (Signed)
Recreation Therapy Group Note   Group Topic:Leisure Education  Group Date: 07/26/2022 Start Time: 1000 End Time: 1110 Facilitators: Rosina Lowenstein, LRT, CTRS Location:  Craft Room  Group Description: Leisure. Patients were given the option to choose from painting, playing apples to apples, making origami, journaling, or sing karaoke/listen to music duration of session. LRT and pts discussed the importance of participating in leisure during their free time and when they're outside of the hospital. Pt identified two leisure interests and shared with the group  Affect/Mood: N/A   Participation Level: Did not attend    Clinical Observations/Individualized Feedback: Travis Sandoval did not attend group due to resting in his room.  Plan: Continue to engage patient in RT group sessions 2-3x/week.   Rosina Lowenstein, LRT, CTRS 07/26/2022 12:00 PM

## 2022-07-26 NOTE — Group Note (Signed)
Date:  07/26/2022 Time:  5:52 PM  Group Topic/Focus:  Goals Group:   The focus of this group is to help patients establish daily goals to achieve during treatment and discuss how the patient can incorporate goal setting into their daily lives to aide in recovery.  Music Therapy   Participation Level:  Did Not Attend  Perl Kerney A Ruble Pumphrey 07/26/2022, 5:52 PM

## 2022-07-26 NOTE — Group Note (Signed)
Date:  07/26/2022 Time:  6:33 PM  Group Topic/Focus:  Activity Group    Participation Level:  Did Not Attend   Lynelle Smoke [facility] 07/26/2022, 6:33 PM

## 2022-07-26 NOTE — Progress Notes (Signed)
Patient calm and pleasant during assessment denying SI/HI/AVH. Pt compliant with medication administration per MD orders. Pt given education, support, and encouragement to be active in his treatment plan. Pt being monitored Q 15 minutes for safety per unit protocol, remains safe on the unit  

## 2022-07-26 NOTE — Plan of Care (Signed)
D: Patient alert and oriented. Patient denies pain. Patient denies anxiety and depression. Patient denies SI/HI/AVH. Patient refused scheduled medication this shift. Patient remains isolative to room with exception to coming out for meals.  A:  Q15 minute safety checks maintained.   R: Patient remains safe on the unit at this time. Problem: Education: Goal: Knowledge of [place] [facility] Education information/materials will improve Outcome: Progressing Goal: Verbalization of understanding the information provided will improve Outcome: Progressing   Problem: Safety: Goal: Periods of time without injury will increase Outcome: Progressing

## 2022-07-26 NOTE — Progress Notes (Signed)
[facility]  07/26/2022 3:31 PM Travis Sandoval  MRN:  [id number] Subjective: Follow-up patient with schizophrenia.  Unchanged.  No complaints.  Staying in bed pretty much all the time.  No dangerous behavior.  Appears to be lucid.  We are entirely waiting on progress with the money so we can look at discharge.  Absolutely no change to the patient on Friday.  Everything about his current situation is stable Principal Problem: Schizophrenia Diagnosis: Principal Problem:   Schizophrenia Active Problems:   Psychosis  Total Time spent with patient: 30 minutes  Past Psychiatric History: Past history of schizophrenia  Past Medical History:  Past Medical History:  Diagnosis Date   ADHD    Bipolar 1 disorder (HCC)    Hepatitis C    Schizoaffective disorder (HCC)    History reviewed. No pertinent surgical history. Family History:  Family History  Family history unknown: Yes   Family Psychiatric  History: See previous Social History:  Social History   Substance and Sexual Activity  Alcohol Use Not Currently     Social History   Substance and Sexual Activity  Drug Use Not Currently    Social History   Socioeconomic History   Marital status: Unknown    Spouse name: Not on file   Number of children: Not on file   Years of education: Not on file   Highest education level: Not on file  Occupational History   Not on file  Tobacco Use   Smoking status: Every Day    Packs/day: 0.25    Years: 15.00    Additional pack years: 0.00    Total pack years: 3.75    Types: Cigarettes   Smokeless tobacco: Not on file  Vaping Use   Vaping Use: Not on file  Substance and Sexual Activity   Alcohol use: Not Currently   Drug use: Not Currently   Sexual activity: Not Currently  Other Topics Concern   Not on file  Social History Narrative   Not on file   Social Determinants of Health   Financial Resource Strain: Not on file  Food Insecurity: Patient Declined (05/18/2022)    Hunger Vital Sign    Worried About Running Out of Food in the Last Year: Patient declined    Ran Out of Food in the Last Year: Patient declined  Transportation Needs: Patient Declined (05/18/2022)   PRAPARE - Administrator, Civil ServiceTransportation    Lack of Transportation (Medical): Patient declined    Lack of Transportation (Non-Medical): Patient declined  Physical Activity: Not on file  Stress: Not on file  Social Connections: Not on file   Additional Social History:                         Sleep: Fair  Appetite:  Fair  Current Medications: Current Facility-Administered Medications  Medication Dose Route Frequency Provider Last Rate Last Admin   acetaminophen (TYLENOL) tablet 650 mg  650 mg Oral Q6H PRN Vanetta MuldersBarthold, Louise F, NP       alum & mag hydroxide-simeth (MAALOX/MYLANTA) 200-200-20 MG/5ML suspension 30 mL  30 mL Oral Q4H PRN Gabriel CirriBarthold, Louise F, NP   30 mL at 07/23/22 2114   ARIPiprazole ER (ABILIFY MAINTENA) injection 400 mg  400 mg Intramuscular Q28 days Nakeitha Milligan T, MD   400 mg at 07/25/22 1501   cloZAPine (CLOZARIL) tablet 350 mg  350 mg Oral QHS Riordan Walle, Jackquline DenmarkJohn T, MD   350 mg at 07/25/22 2112   docusate  sodium (COLACE) capsule 200 mg  200 mg Oral BID Godwin Tedesco T, MD   200 mg at 07/24/22 1908   famotidine (PEPCID) tablet 20 mg  20 mg Oral BID Zadrian Mccauley T, MD   20 mg at 07/20/22 1740   hydrOXYzine (ATARAX) tablet 50 mg  50 mg Oral Q6H PRN Yosselyn Tax T, MD   50 mg at 07/20/22 2102   magnesium citrate solution 1 Bottle  1 Bottle Oral Daily PRN Jaynie BreamChilds, Caroline A, RPH   1 Bottle at 07/19/22 1643   magnesium hydroxide (MILK OF MAGNESIA) suspension 30 mL  30 mL Oral Daily PRN Jaynie BreamChilds, Caroline A, RPH   30 mL at 07/18/22 1718   mineral oil liquid   Oral Daily PRN Sarina IllHerrick, Richard Edward, DO   Given at 07/17/22 1201   nicotine polacrilex (NICORETTE) gum 2 mg  2 mg Oral PRN Tiarra Anastacio, Jackquline DenmarkJohn T, MD   2 mg at 04/23/22 2200   polyethylene glycol (MIRALAX / GLYCOLAX) packet 17 g  17 g Oral  Daily Dorella Laster, Jackquline DenmarkJohn T, MD   17 g at 07/24/22 1004   senna-docusate (Senokot-S) tablet 2 tablet  2 tablet Oral Daily PRN Stephie Xu, Jackquline DenmarkJohn T, MD   2 tablet at 07/14/22 0809   ziprasidone (GEODON) injection 20 mg  20 mg Intramuscular Q12H PRN Reggie PileJoshi, Anand, MD        Lab Results:  No results found for this or any previous visit (from the past 48 hour(s)).   Blood Alcohol level:  Lab Results  Component Value Date   ETH <10 04/15/2022    Metabolic Disorder Labs: Lab Results  Component Value Date   HGBA1C 5.5 04/24/2022   MPG 111 04/24/2022   No results found for: "PROLACTIN" Lab Results  Component Value Date   CHOL 220 (H) 04/24/2022   TRIG 700 (H) 04/24/2022   HDL 35 (L) 04/24/2022   CHOLHDL 6.3 04/24/2022   VLDL UNABLE TO CALCULATE IF TRIGLYCERIDE OVER 400 mg/dL 16/10/960401/06/2022   LDLCALC UNABLE TO CALCULATE IF TRIGLYCERIDE OVER 400 mg/dL 54/09/811901/06/2022    Physical Findings: AIMS: Facial and Oral Movements Muscles of Facial Expression: None, normal Lips and Perioral Area: None, normal Jaw: None, normal Tongue: None, normal,Extremity Movements Upper (arms, wrists, hands, fingers): None, normal Lower (legs, knees, ankles, toes): None, normal, Trunk Movements Neck, shoulders, hips: None, normal, Overall Severity Severity of abnormal movements (highest score from questions above): None, normal Incapacitation due to abnormal movements: None, normal Patient's awareness of abnormal movements (rate only patient's report): No Awareness, Dental Status Current problems with teeth and/or dentures?: No Does patient usually wear dentures?: No  CIWA:    COWS:     Musculoskeletal: Strength & Muscle Tone: within normal limits Gait & Station: normal Patient leans: N/A  Psychiatric Specialty Exam:  Presentation  General Appearance:  Disheveled  Eye Contact: Minimal  Speech: Pressured  Speech Volume: Normal  Handedness: Right   Mood and Affect  Mood: Euphoric;  Irritable  Affect: Inappropriate   Thought Process  Thought Processes: Disorganized  Descriptions of Associations:Loose  Orientation:Partial  Thought Content:Illogical; Scattered  History of Schizophrenia/Schizoaffective disorder:No data recorded Duration of Psychotic Symptoms:No data recorded Hallucinations:No data recorded Ideas of Reference:Delusions; Paranoia  Suicidal Thoughts:No data recorded Homicidal Thoughts:No data recorded  Sensorium  Memory: Immediate Poor; Recent Poor; Remote Poor  Judgment: Poor  Insight: Poor   Executive Functions  Concentration: Fair  Attention Span: Fair  Recall: Poor  Fund of Knowledge: Poor  Language: Poor   Psychomotor  Activity  Psychomotor Activity:No data recorded  Assets  Assets: Desire for Improvement; Social Support   Sleep  Sleep:No data recorded   Physical Exam: Physical Exam Vitals and nursing note reviewed.  Constitutional:      Appearance: Normal appearance.  HENT:     Head: Normocephalic and atraumatic.     Mouth/Throat:     Pharynx: Oropharynx is clear.  Eyes:     Pupils: Pupils are equal, round, and reactive to light.  Cardiovascular:     Rate and Rhythm: Normal rate and regular rhythm.  Pulmonary:     Effort: Pulmonary effort is normal.     Breath sounds: Normal breath sounds.  Abdominal:     General: Abdomen is flat.     Palpations: Abdomen is soft.  Musculoskeletal:        General: Normal range of motion.  Skin:    General: Skin is warm and dry.  Neurological:     General: No focal deficit present.     Mental Status: He is alert. Mental status is at baseline.  Psychiatric:        Attention and Perception: Attention normal.        Mood and Affect: Mood normal.        Speech: Speech normal.        Behavior: Behavior normal.        Thought Content: Thought content normal.        Cognition and Memory: Cognition normal.        Judgment: Judgment normal.    Review of  Systems  Constitutional: Negative.   HENT: Negative.    Eyes: Negative.   Respiratory: Negative.    Cardiovascular: Negative.   Gastrointestinal: Negative.   Musculoskeletal: Negative.   Skin: Negative.   Neurological: Negative.   Psychiatric/Behavioral: Negative.     Blood pressure 119/73, pulse (!) 103, temperature (!) 97.5 F (36.4 C), temperature source Oral, resp. rate 18, height 5\' 9"  (1.753 m), weight 96.4 kg, SpO2 97 %. Body mass index is 31.38 kg/m.   Treatment Plan Summary: Medication management and Plan no change to current plan.  Waiting for discharge options and finances nothing new about the plan.  He continues to be here entirely so that we can find placement for him given that he is not safe for discharge to a shelter  Travis Rasmussen, MD 07/26/2022, 3:31 PM

## 2022-07-27 DIAGNOSIS — F200 Paranoid schizophrenia: Secondary | ICD-10-CM | POA: Diagnosis not present

## 2022-07-27 NOTE — Group Note (Signed)
Date:  07/27/2022 Time:  6:23 PM  Group Topic/Focus:  Community  Meeting, Goals Group, Activity- outside/courtyard    Participation Level:  Did Not Attend  Shalamar Crays Travis Katriona Schmierer 07/27/2022, 6:23 PM  

## 2022-07-27 NOTE — Progress Notes (Signed)
D- Patient alert and oriented x3-4. Affect/mood. Denies SI/ HI/ AVH. He denies pain. Quotes. He refuses Colace and Pepcid states "It doesn't work for me". A- Scheduled medications administered to patient, per MD orders. Support and encouragement provided.  Routine safety checks conducted every 15 minutes.  Patient informed to notify staff with problems or concerns. R- No adverse drug reactions noted. Patient compliant with all other medications and treatment plan. Patient receptive, calm, and cooperative.Patient is isolative to his room and did not participate in outdoor activity this afternoon. He contracts for safety and  remains safe on the unit at this time.

## 2022-07-27 NOTE — Progress Notes (Signed)
[facility] MD Progress Note  07/27/2022 11:17 AM Krisztian Simonetti  MRN:  [id number] Subjective: Travis Sandoval is seen on rounds.  No changes.  He has been compliant and pleasant.  He has no complaints and nurses report no issues. Principal Problem: Schizophrenia Diagnosis: Principal Problem:   Schizophrenia Active Problems:   Psychosis  Total Time spent with patient: 15 minutes  Past Psychiatric History: Long history of schizophrenia  Past Medical History:  Past Medical History:  Diagnosis Date   ADHD    Bipolar 1 disorder (HCC)    Hepatitis C    Schizoaffective disorder (HCC)    History reviewed. No pertinent surgical history. Family History:  Family History  Family history unknown: Yes   Family Psychiatric  History: Unremarkable Social History:  Social History   Substance and Sexual Activity  Alcohol Use Not Currently     Social History   Substance and Sexual Activity  Drug Use Not Currently    Social History   Socioeconomic History   Marital status: Unknown    Spouse name: Not on file   Number of children: Not on file   Years of education: Not on file   Highest education level: Not on file  Occupational History   Not on file  Tobacco Use   Smoking status: Every Day    Packs/day: 0.25    Years: 15.00    Additional pack years: 0.00    Total pack years: 3.75    Types: Cigarettes   Smokeless tobacco: Not on file  Vaping Use   Vaping Use: Not on file  Substance and Sexual Activity   Alcohol use: Not Currently   Drug use: Not Currently   Sexual activity: Not Currently  Other Topics Concern   Not on file  Social History Narrative   Not on file   Social Determinants of Health   Financial Resource Strain: Not on file  Food Insecurity: Patient Declined (05/18/2022)   Hunger Vital Sign    Worried About Running Out of Food in the Last Year: Patient declined    Ran Out of Food in the Last Year: Patient declined  Transportation Needs: Patient Declined (05/18/2022)    PRAPARE - Administrator, Civil Service (Medical): Patient declined    Lack of Transportation (Non-Medical): Patient declined  Physical Activity: Not on file  Stress: Not on file  Social Connections: Not on file   Additional Social History:                         Sleep: Good  Appetite:  Good  Current Medications: Current Facility-Administered Medications  Medication Dose Route Frequency Provider Last Rate Last Admin   acetaminophen (TYLENOL) tablet 650 mg  650 mg Oral Q6H PRN Vanetta Mulders, NP       alum & mag hydroxide-simeth (MAALOX/MYLANTA) 200-200-20 MG/5ML suspension 30 mL  30 mL Oral Q4H PRN Gabriel Cirri F, NP   30 mL at 07/23/22 2114   ARIPiprazole ER (ABILIFY MAINTENA) injection 400 mg  400 mg Intramuscular Q28 days [name], [name], MD   400 mg at 07/25/22 1501   cloZAPine (CLOZARIL) tablet 350 mg  350 mg Oral QHS [name], [name], MD   350 mg at 07/26/22 2120   docusate sodium (COLACE) capsule 200 mg  200 mg Oral BID [name], [name], MD   200 mg at 07/27/22 0939   famotidine (PEPCID) tablet 20 mg  20 mg Oral BID [name], [name],  MD   20 mg at 07/20/22 1740   hydrOXYzine (ATARAX) tablet 50 mg  50 mg Oral Q6H PRN [name], [name], MD   50 mg at 07/20/22 2102   magnesium citrate solution 1 Bottle  1 Bottle Oral Daily PRN Jaynie BreamChilds, Caroline A, RPH   1 Bottle at 07/19/22 1643   magnesium hydroxide (MILK OF MAGNESIA) suspension 30 mL  30 mL Oral Daily PRN Jaynie BreamChilds, Caroline A, RPH   30 mL at 07/18/22 1718   mineral oil liquid   Oral Daily PRN Sarina IllHerrick, Jaimon Bugaj Edward, DO   Given at 07/17/22 1201   nicotine polacrilex (NICORETTE) gum 2 mg  2 mg Oral PRN [name], Jackquline DenmarkJohn T, MD   2 mg at 04/23/22 2200   polyethylene glycol (MIRALAX / GLYCOLAX) packet 17 g  17 g Oral Daily [name], Jackquline DenmarkJohn T, MD   17 g at 07/27/22 [id number]   senna-docusate (Senokot-S) tablet 2 tablet  2 tablet Oral Daily PRN [name], Jackquline DenmarkJohn T, MD   2 tablet at 07/14/22 0809   ziprasidone (GEODON) injection 20  mg  20 mg Intramuscular Q12H PRN Reggie PileJoshi, Anand, MD        Lab Results: No results found for this or any previous visit (from the past 48 hour(s)).  Blood Alcohol level:  Lab Results  Component Value Date   ETH <10 04/15/2022    Metabolic Disorder Labs: Lab Results  Component Value Date   HGBA1C 5.5 04/24/2022   MPG 111 04/24/2022   No results found for: "PROLACTIN" Lab Results  Component Value Date   CHOL 220 (H) 04/24/2022   TRIG 700 (H) 04/24/2022   HDL 35 (L) 04/24/2022   CHOLHDL 6.3 04/24/2022   VLDL UNABLE TO CALCULATE IF TRIGLYCERIDE OVER 400 mg/dL 11/91/478201/06/2022   LDLCALC UNABLE TO CALCULATE IF TRIGLYCERIDE OVER 400 mg/dL 95/62/130801/06/2022    Physical Findings: AIMS: Facial and Oral Movements Muscles of Facial Expression: None, normal Lips and Perioral Area: None, normal Jaw: None, normal Tongue: None, normal,Extremity Movements Upper (arms, wrists, hands, fingers): None, normal Lower (legs, knees, ankles, toes): None, normal, Trunk Movements Neck, shoulders, hips: None, normal, Overall Severity Severity of abnormal movements (highest score from questions above): None, normal Incapacitation due to abnormal movements: None, normal Patient's awareness of abnormal movements (rate only patient's report): No Awareness, Dental Status Current problems with teeth and/or dentures?: No Does patient usually wear dentures?: No  CIWA:    COWS:     Musculoskeletal: Strength & Muscle Tone: within normal limits Gait & Station: normal Patient leans: N/A  Psychiatric Specialty Exam:  Presentation  General Appearance:  Disheveled  Eye Contact: Minimal  Speech: Pressured  Speech Volume: Normal  Handedness: Right   Mood and Affect  Mood: Euphoric; Irritable  Affect: Inappropriate   Thought Process  Thought Processes: Disorganized  Descriptions of Associations:Loose  Orientation:Partial  Thought Content:Illogical; Scattered  History of  Schizophrenia/Schizoaffective disorder:No data recorded Duration of Psychotic Symptoms:No data recorded Hallucinations:No data recorded Ideas of Reference:Delusions; Paranoia  Suicidal Thoughts:No data recorded Homicidal Thoughts:No data recorded  Sensorium  Memory: Immediate Poor; Recent Poor; Remote Poor  Judgment: Poor  Insight: Poor   Executive Functions  Concentration: Fair  Attention Span: Fair  Recall: Poor  Fund of Knowledge: Poor  Language: Poor   Psychomotor Activity  Psychomotor Activity:No data recorded  Assets  Assets: Desire for Improvement; Social Support   Sleep  Sleep:No data recorded   Blood pressure 109/82, pulse (!) 107, temperature 98 F (36.7 C), temperature source Oral,  resp. rate 18, height 5\' 9"  (1.753 m), weight 96.4 kg, SpO2 97 %. Body mass index is 31.38 kg/m.   Treatment Plan Summary: Daily contact with patient to assess and evaluate symptoms and progress in treatment, Medication management, and Plan continue current medications.  Sarina Ill, DO 07/27/2022, 11:17 AM

## 2022-07-28 DIAGNOSIS — F200 Paranoid schizophrenia: Secondary | ICD-10-CM | POA: Diagnosis not present

## 2022-07-28 NOTE — [facility] Group Notes (Signed)
[facility] Group Notes:  (Nursing/MHT/Case Management/Adjunct)  Date:  07/28/2022  Time:  9:30 PM  Type of Therapy:   wrap up  Participation Level:  Active  Participation Quality:  Appropriate  Affect:  Appropriate  Cognitive:  Appropriate  Insight:  Appropriate  Engagement in Group:  Engaged  Modes of Intervention:  Activity  Summary of Progress/Problems:  Travis Sandoval 07/28/2022, 9:30 PM

## 2022-07-28 NOTE — Progress Notes (Signed)
D: Patient alert and oriented x 3, affect is flat and thoughts are organized, he denies SI/HI/AVH. Patient is pleasant and cooperative. Patient appears less anxious and he is interacting with peers and staff appropriately.  A: Patient was offered support and encouragement. He was given scheduled medications and encouraged to attend groups.  15 minute checks were done for safety.  R: Patient attends groups and interacts with peers and staff, he is complaint with medication this evening. Patient has no complaints and he is receptive to treatment and safety maintained on unit. 

## 2022-07-28 NOTE — Group Note (Signed)
Date:  07/28/2022 Time:  5:28 PM  Group Topic/Focus:  Activity Group    Participation Level:  Minimal  Participation Quality:  Appropriate  Affect:  Appropriate  Cognitive:  Appropriate  Insight: Appropriate  Engagement in Group:  Limited  Modes of Intervention:  Activity  Additional Comments:    Wilford Corner 07/28/2022, 5:28 PM

## 2022-07-28 NOTE — Progress Notes (Signed)
[facility]  07/28/2022 11:52 AM Roxan DieselBranson Rastetter  MRN:  [id number] Subjective: Travis GallowayMickey is seen on rounds.  No changes.  Nurses report no issues.  He has no complaints.  No side effects. Principal Problem: Schizophrenia Diagnosis: Principal Problem:   Schizophrenia Active Problems:   Psychosis  Total Time spent with patient: 15 minutes  Past Psychiatric History: Schizophrenia  Past Medical History:  Past Medical History:  Diagnosis Date   ADHD    Bipolar 1 disorder (HCC)    Hepatitis C    Schizoaffective disorder (HCC)    History reviewed. No pertinent surgical history. Family History:  Family History  Family history unknown: Yes   Family Psychiatric  History: Unremarkable Social History:  Social History   Substance and Sexual Activity  Alcohol Use Not Currently     Social History   Substance and Sexual Activity  Drug Use Not Currently    Social History   Socioeconomic History   Marital status: Unknown    Spouse name: Not on file   Number of children: Not on file   Years of education: Not on file   Highest education level: Not on file  Occupational History   Not on file  Tobacco Use   Smoking status: Every Day    Packs/day: 0.25    Years: 15.00    Additional pack years: 0.00    Total pack years: 3.75    Types: Cigarettes   Smokeless tobacco: Not on file  Vaping Use   Vaping Use: Not on file  Substance and Sexual Activity   Alcohol use: Not Currently   Drug use: Not Currently   Sexual activity: Not Currently  Other Topics Concern   Not on file  Social History Narrative   Not on file   Social Determinants of Health   Financial Resource Strain: Not on file  Food Insecurity: Patient Declined (05/18/2022)   Hunger Vital Sign    Worried About Running Out of Food in the Last Year: Patient declined    Ran Out of Food in the Last Year: Patient declined  Transportation Needs: Patient Declined (05/18/2022)   PRAPARE - Scientist, research (physical sciences)Transportation    Lack of  Transportation (Medical): Patient declined    Lack of Transportation (Non-Medical): Patient declined  Physical Activity: Not on file  Stress: Not on file  Social Connections: Not on file   Additional Social History:                         Sleep: Good  Appetite:  Good  Current Medications: Current Facility-Administered Medications  Medication Dose Route Frequency Provider Last Rate Last Admin   acetaminophen (TYLENOL) tablet 650 mg  650 mg Oral Q6H PRN Vanetta MuldersBarthold, Louise F, NP       alum & mag hydroxide-simeth (MAALOX/MYLANTA) 200-200-20 MG/5ML suspension 30 mL  30 mL Oral Q4H PRN Gabriel CirriBarthold, Louise F, NP   30 mL at 07/23/22 2114   ARIPiprazole ER (ABILIFY MAINTENA) injection 400 mg  400 mg Intramuscular Q28 days [name], [name], MD   400 mg at 07/25/22 1501   cloZAPine (CLOZARIL) tablet 350 mg  350 mg Oral QHS [name], [name], MD   350 mg at 07/27/22 2232   docusate sodium (COLACE) capsule 200 mg  200 mg Oral BID [name], [name], MD   200 mg at 07/27/22 0939   famotidine (PEPCID) tablet 20 mg  20 mg Oral BID [name], Jackquline DenmarkJohn T, MD   20 mg at  07/20/22 1740   hydrOXYzine (ATARAX) tablet 50 mg  50 mg Oral Q6H PRN [name], [name], MD   50 mg at 07/20/22 2102   magnesium citrate solution 1 Bottle  1 Bottle Oral Daily PRN Jaynie Bream, RPH   1 Bottle at 07/19/22 1643   magnesium hydroxide (MILK OF MAGNESIA) suspension 30 mL  30 mL Oral Daily PRN Jaynie Bream, RPH   30 mL at 07/18/22 1718   mineral oil liquid   Oral Daily PRN Sarina Ill, DO   Given at 07/17/22 1201   nicotine polacrilex (NICORETTE) gum 2 mg  2 mg Oral PRN [name], [name], MD   2 mg at 04/23/22 2200   polyethylene glycol (MIRALAX / GLYCOLAX) packet 17 g  17 g Oral Daily [name], [name], MD   17 g at 07/28/22 0940   senna-docusate (Senokot-S) tablet 2 tablet  2 tablet Oral Daily PRN [name], [name], MD   2 tablet at 07/14/22 0809   ziprasidone (GEODON) injection 20 mg  20 mg Intramuscular Q12H PRN  Reggie Pile, MD        Lab Results: No results found for this or any previous visit (from the past 48 hour(s)).  Blood Alcohol level:  Lab Results  Component Value Date   ETH <10 04/15/2022    Metabolic Disorder Labs: Lab Results  Component Value Date   HGBA1C 5.5 04/24/2022   MPG 111 04/24/2022   No results found for: "PROLACTIN" Lab Results  Component Value Date   CHOL 220 (H) 04/24/2022   TRIG 700 (H) 04/24/2022   HDL 35 (L) 04/24/2022   CHOLHDL 6.3 04/24/2022   VLDL UNABLE TO CALCULATE IF TRIGLYCERIDE OVER 400 mg/dL 40/98/1191   LDLCALC UNABLE TO CALCULATE IF TRIGLYCERIDE OVER 400 mg/dL 47/82/9562    Physical Findings: AIMS: Facial and Oral Movements Muscles of Facial Expression: None, normal Lips and Perioral Area: None, normal Jaw: None, normal Tongue: None, normal,Extremity Movements Upper (arms, wrists, hands, fingers): None, normal Lower (legs, knees, ankles, toes): None, normal, Trunk Movements Neck, shoulders, hips: None, normal, Overall Severity Severity of abnormal movements (highest score from questions above): None, normal Incapacitation due to abnormal movements: None, normal Patient's awareness of abnormal movements (rate only patient's report): No Awareness, Dental Status Current problems with teeth and/or dentures?: No Does patient usually wear dentures?: No  CIWA:    COWS:     Musculoskeletal: Strength & Muscle Tone: within normal limits Gait & Station: normal Patient leans: N/A  Psychiatric Specialty Exam:  Presentation  General Appearance:  Disheveled  Eye Contact: Minimal  Speech: Pressured  Speech Volume: Normal  Handedness: Right   Mood and Affect  Mood: Euphoric; Irritable  Affect: Inappropriate   Thought Process  Thought Processes: Disorganized  Descriptions of Associations:Loose  Orientation:Partial  Thought Content:Illogical; Scattered  History of Schizophrenia/Schizoaffective disorder:No data  recorded Duration of Psychotic Symptoms:No data recorded Hallucinations:No data recorded Ideas of Reference:Delusions; Paranoia  Suicidal Thoughts:No data recorded Homicidal Thoughts:No data recorded  Sensorium  Memory: Immediate Poor; Recent Poor; Remote Poor  Judgment: Poor  Insight: Poor   Executive Functions  Concentration: Fair  Attention Span: Fair  Recall: Poor  Fund of Knowledge: Poor  Language: Poor   Psychomotor Activity  Psychomotor Activity:No data recorded  Assets  Assets: Desire for Improvement; Social Support   Sleep  Sleep:No data recorded    Blood pressure 113/73, pulse (!) 101, temperature 98.1 F (36.7 C), temperature source Oral, resp. rate 18, height 5'  9" (1.753 m), weight 96.4 kg, SpO2 98 %. Body mass index is 31.38 kg/m.   Treatment Plan Summary: Daily contact with patient to assess and evaluate symptoms and progress in treatment, Medication management, and Plan continue current medications.  Sarina Ill, DO 07/28/2022, 11:52 AM

## 2022-07-28 NOTE — Progress Notes (Signed)
D- Patient alert and oriented. Affect/mood. Denies SI/ HI/AVH. He refuses Pepcid and Colace. A- Scheduled medications administered to patient except Colace and Pepcid, per MD orders. Support and encouragement provided.  Routine safety checks conducted every 15 minutes.  Patient informed to notify staff with problems or concerns. R- No adverse drug reactions noted. Patient contracts for safety at this time. Patient compliant with medications and treatment plan. Patient receptive, calm, and cooperative. He is isolative to his room except for meals.  He did go out briefly for outdoor activity. He contracts for safety and remains safe on the unit at this time.

## 2022-07-28 NOTE — [facility] Group Notes (Signed)
LCSW Group Therapy Note   07/28/2022 1:15pm   Type of Therapy and Topic:  Group Therapy:  Overcoming Obstacles   Participation Level:  Did Not Attend   Description of Group:    In this group patients will be encouraged to explore what they see as obstacles to their own wellness and recovery. They will be guided to discuss their thoughts, feelings, and behaviors related to these obstacles. The group will process together ways to cope with barriers, with attention given to specific choices patients can make. Each patient will be challenged to identify changes they are motivated to make in order to overcome their obstacles. This group will be process-oriented, with patients participating in exploration of their own experiences as well as giving and receiving support and challenge from other group members.   Therapeutic Goals: Patient will identify personal and current obstacles as they relate to admission. Patient will identify barriers that currently interfere with their wellness or overcoming obstacles.  Patient will identify feelings, thought process and behaviors related to these barriers. Patient will identify two changes they are willing to make to overcome these obstacles:      Summary of Patient Progress      Therapeutic Modalities:   Cognitive Behavioral Therapy Solution Focused Therapy Motivational Interviewing Relapse Prevention Therapy  Lorri Frederick, LCSW 07/28/2022 3:01 PM

## 2022-07-28 NOTE — Progress Notes (Signed)
   07/28/22 2200  Psych Admission Type (Psych Patients Only)  Admission Status Voluntary  Psychosocial Assessment  Patient Complaints None  Eye Contact Fair  Facial Expression Animated  Affect Appropriate to circumstance  Speech Logical/coherent  Interaction Assertive  Motor Activity Slow  Appearance/Hygiene Unremarkable  Behavior Characteristics Cooperative  Mood Pleasant  Thought Process  Coherency WDL  Content WDL  Delusions None reported or observed  Perception WDL  Hallucination None reported or observed  Judgment Poor  Confusion None  Danger to Self  Current suicidal ideation? Denies  Agreement Not to Harm Self Yes  Description of Agreement verbal  Danger to Others  Danger to Others None reported or observed   Pt observed in the room talking to himself, pt reported a good day, good appetite, although complained of reflux. Pt did attend the wrap up group and participated. Pt took his evening medication with ease, denied SI/HI and contracted for safety, will continue to monitor.

## 2022-07-29 DIAGNOSIS — F203 Undifferentiated schizophrenia: Secondary | ICD-10-CM | POA: Diagnosis not present

## 2022-07-29 NOTE — Progress Notes (Signed)
Patient calm and pleasant during assessment denying SI/HI/AVH. Pt compliant with medication administration per MD orders. Pt given education, support, and encouragement to be active in his treatment plan. Pt being monitored Q 15 minutes for safety per unit protocol, remains safe on the unit  

## 2022-07-29 NOTE — Plan of Care (Signed)
  Problem: Safety: Goal: Ability to remain free from injury will improve Outcome: Progressing   Problem: Activity: Goal: Risk for activity intolerance will decrease Outcome: Not Progressing   

## 2022-07-29 NOTE — Group Note (Signed)
Recreation Therapy Group Note   Group Topic:Healthy Support Systems  Group Date: 07/29/2022 Start Time: 1000 End Time: 1100 Facilitators: Rosina Lowenstein, LRT, CTRS Location:  Craft Room  Group Description: Straw Bridge. Patients were given 10 plastic drinking straws and an equal length of masking tape. Using the materials provided, patients were instructed to build a free-standing bridge-like structure to suspend an everyday item (ex: deck of cards) off the floor or table surface. All materials were required to be used in Secondary school teacher. LRT facilitated post-activity discussion reviewing the importance of having strong and healthy support systems in our lives. LRT discussed how the people in our lives serve as the tape and the book we placed on top of our straw structure are the stressors we face in daily life. LRT and pts discussed what happens in our life when things get too heavy for [place], and we don't have strong supports outside of the hospital. Pt shared 2 of their healthy supports aloud in the group.   Affect/Mood: N/A   Participation Level: Did not attend    Clinical Observations/Individualized Feedback: Patient did not attend group due to resting in his room.  Plan: Continue to engage patient in RT group sessions 2-3x/week.   Rosina Lowenstein, LRT, CTRS 07/29/2022 11:51 AM

## 2022-07-29 NOTE — Progress Notes (Signed)
Patient is A+O x 4. Flat affect. Patient denies SI/HI/AVH. Denies anxiety and depression although mood is depressed. Appetite good. Denies pain. Patient was not interested in taking his medications which were Colace, Pepcid, and Miralax. States last bowel  movement was yesterday.  Patient did not participate in either group that was held on the unit choosing to rest in his room during both activities.   No changes to medication. Discharge planning ongoing. Q15 minute unit checks in place.

## 2022-07-29 NOTE — Progress Notes (Signed)
[facility]  07/29/2022 2:06 PM Roxan DieselBranson Jablon  MRN:  [id number]  Subjective: Follow-up patient with schizophrenia. He continues to be stable with no concerns.  Sleep and appetite is "good".  Denies depression and anxiety.  No side effects form his medications, no changes made.  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 No pertinent surgical history. Family History:  Family History  Family history unknown: Yes   Family Psychiatric  History: See previous Social History:  Social History   Substance and Sexual Activity  Alcohol Use Not Currently     Social History   Substance and Sexual Activity  Drug Use Not Currently    Social History   Socioeconomic History   Marital status: Unknown    Spouse name: Not on file   Number of children: Not on file   Years of education: Not on file   Highest education level: Not on file  Occupational History   Not on file  Tobacco Use   Smoking status: Every Day    Packs/day: 0.25    Years: 15.00    Additional pack years: 0.00    Total pack years: 3.75    Types: Cigarettes   Smokeless tobacco: Not on file  Vaping Use   Vaping Use: Not on file  Substance and Sexual Activity   Alcohol use: Not Currently   Drug use: Not Currently   Sexual activity: Not Currently  Other Topics Concern   Not on file  Social History Narrative   Not on file   Social Determinants of Health   Financial Resource Strain: Not on file  Food Insecurity: Patient Declined (05/18/2022)   Hunger Vital Sign    Worried About Running Out of Food in the Last Year: Patient declined    Ran Out of Food in the Last  Year: Patient declined  Transportation Needs: Patient Declined (05/18/2022)   PRAPARE - Administrator, Civil ServiceTransportation    Lack of Transportation (Medical): Patient declined    Lack of Transportation (Non-Medical): Patient declined  Physical Activity: Not on file  Stress: Not on file  Social Connections: Not on file   Additional Social History:     Sleep: Good  Appetite:  Good  Current Medications: Current Facility-Administered Medications  Medication Dose Route Frequency Provider Last Rate Last Admin   acetaminophen (TYLENOL) tablet 650 mg  650 mg Oral Q6H PRN Gabriel CirriBarthold, Louise F, NP       alum & mag hydroxide-simeth (MAALOX/MYLANTA) 200-200-20 MG/5ML suspension 30 mL  30 mL Oral Q4H PRN Gabriel CirriBarthold, Louise F, NP   30 mL at 07/28/22 2132   ARIPiprazole ER (ABILIFY MAINTENA) injection 400 mg  400 mg Intramuscular Q28 days [name], [name], MD   400 mg at 07/25/22 1501   cloZAPine (CLOZARIL) tablet 350 mg  350 mg Oral QHS [name], [name], MD   350 mg at 07/28/22 2131   docusate sodium (COLACE) capsule 200 mg  200 mg Oral BID [name], [name], MD   200 mg at 07/27/22 0939   famotidine (PEPCID) tablet 20 mg  20 mg Oral BID [name], Jackquline DenmarkJohn T, MD   20 mg at 07/20/22 1740   hydrOXYzine (ATARAX)  tablet 50 mg  50 mg Oral Q6H PRN [name], [name], MD   50 mg at 07/20/22 2102   magnesium citrate solution 1 Bottle  1 Bottle Oral Daily PRN Jaynie Bream, RPH   1 Bottle at 07/19/22 1643   magnesium hydroxide (MILK OF MAGNESIA) suspension 30 mL  30 mL Oral Daily PRN Jaynie Bream, RPH   30 mL at 07/18/22 1718   mineral oil liquid   Oral Daily PRN Sarina Ill, DO   Given at 07/17/22 1201   nicotine polacrilex (NICORETTE) gum 2 mg  2 mg Oral PRN [name], [name], MD   2 mg at 04/23/22 2200   polyethylene glycol (MIRALAX / GLYCOLAX) packet 17 g  17 g Oral Daily [name], [name], MD   17 g at 07/28/22 0940   senna-docusate (Senokot-S) tablet 2 tablet  2 tablet Oral Daily PRN [name], [name], MD   2 tablet at  07/14/22 0809   ziprasidone (GEODON) injection 20 mg  20 mg Intramuscular Q12H PRN Reggie Pile, MD        Lab Results:  No results found for this or any previous visit (from the past 48 hour(s)).   Blood Alcohol level:  Lab Results  Component Value Date   ETH <10 04/15/2022    Metabolic Disorder Labs: Lab Results  Component Value Date   HGBA1C 5.5 04/24/2022   MPG 111 04/24/2022   No results found for: "PROLACTIN" Lab Results  Component Value Date   CHOL 220 (H) 04/24/2022   TRIG 700 (H) 04/24/2022   HDL 35 (L) 04/24/2022   CHOLHDL 6.3 04/24/2022   VLDL UNABLE TO CALCULATE IF TRIGLYCERIDE OVER 400 mg/dL 99/77/4142   LDLCALC UNABLE TO CALCULATE IF TRIGLYCERIDE OVER 400 mg/dL 39/53/2023    Physical Findings: AIMS: Facial and Oral Movements Muscles of Facial Expression: None, normal Lips and Perioral Area: None, normal Jaw: None, normal Tongue: None, normal,Extremity Movements Upper (arms, wrists, hands, fingers): None, normal Lower (legs, knees, ankles, toes): None, normal, Trunk Movements Neck, shoulders, hips: None, normal, Overall Severity Severity of abnormal movements (highest score from questions above): None, normal Incapacitation due to abnormal movements: None, normal Patient's awareness of abnormal movements (rate only patient's report): No Awareness, Dental Status Current problems with teeth and/or dentures?: No Does patient usually wear dentures?: No  CIWA:    COWS:     Musculoskeletal: Strength & Muscle Tone: within normal limits Gait & Station: normal Patient leans: N/A  Psychiatric Specialty Exam: Physical Exam Vitals and nursing note reviewed.  Constitutional:      Appearance: Normal appearance.  HENT:     Head: Normocephalic.     Nose: Nose normal.  Pulmonary:     Effort: Pulmonary effort is normal.  Musculoskeletal:        General: Normal range of motion.     Cervical back: Normal range of motion.  Neurological:     General: No focal  deficit present.     Mental Status: He is alert and oriented to person, place, and time.  Psychiatric:        Attention and Perception: Attention normal.        Mood and Affect: Mood normal.        Speech: Speech normal.        Behavior: Behavior normal.        Thought Content: Thought content normal.        Cognition and Memory: Cognition normal.  Judgment: Judgment normal.     Review of Systems  Constitutional: Negative.   HENT: Negative.    Eyes: Negative.   Respiratory: Negative.    Cardiovascular: Negative.   Gastrointestinal: Negative.   Musculoskeletal: Negative.   Skin: Negative.   Neurological: Negative.   Psychiatric/Behavioral: Negative.      Blood pressure 111/78, pulse 93, temperature 97.8 F (36.6 C), temperature source Oral, resp. rate 16, height 5\' 9"  (1.753 m), weight 96.4 kg, SpO2 98 %.Body mass index is 31.38 kg/m.  General Appearance: Disheveled  Eye Contact:  Good  Speech:  Normal Rate  Volume:  Normal  Mood:  Euthymic  Affect:  Blunt  Thought Process:  Coherent  Orientation:  Full (Time, Place, and Person)  Thought Content:  Logical  Suicidal Thoughts:  No  Homicidal Thoughts:  No  Memory:  Immediate;   Fair Recent;   Fair Remote;   Fair  Judgement:  Good  Insight:  Fair  Psychomotor Activity:  Normal  Concentration:  Concentration: Good and Attention Span: Good  Recall:  [employer] of Knowledge:  Fair  Language:  Good  Akathisia:  No  Handed:  Right  AIMS (if indicated):     Assets:  Leisure Time Physical Health Resilience Social Support  ADL's:  Intact  Cognition:  WNL  Sleep:         Physical Exam: Physical Exam Vitals and nursing note reviewed.  Constitutional:      Appearance: Normal appearance.  HENT:     Head: Normocephalic.     Nose: Nose normal.  Pulmonary:     Effort: Pulmonary effort is normal.  Musculoskeletal:        General: Normal range of motion.     Cervical back: Normal range of motion.   Neurological:     General: No focal deficit present.     Mental Status: He is alert and oriented to person, place, and time.  Psychiatric:        Attention and Perception: Attention normal.        Mood and Affect: Mood normal.        Speech: Speech normal.        Behavior: Behavior normal.        Thought Content: Thought content normal.        Cognition and Memory: Cognition normal.        Judgment: Judgment normal.    Review of Systems  Constitutional: Negative.   HENT: Negative.    Eyes: Negative.   Respiratory: Negative.    Cardiovascular: Negative.   Gastrointestinal: Negative.   Musculoskeletal: Negative.   Skin: Negative.   Neurological: Negative.   Psychiatric/Behavioral: Negative.     Blood pressure 111/78, pulse 93, temperature 97.8 F (36.6 C), temperature source Oral, resp. rate 16, height 5\' 9"  (1.753 m), weight 96.4 kg, SpO2 98 %. Body mass index is 31.38 kg/m.   Treatment Plan Summary: Medication management and Plan no change to current plan.  Waiting for discharge options and finances nothing new about the plan.  He continues to be here entirely so that we can find placement for him given that he is not safe for discharge to a shelter Schizophrenia, undifferentiated: Clozaril 350 mg daily Abilify Maintena 400 mg monthly  Anxiety: Hydroxyzine 50 mg every 6 hours PRN   Nanine Means, NP 07/29/2022, 2:06 PM

## 2022-07-29 NOTE — Group Note (Signed)
[facility] LCSW Group Therapy Note   Group Date: 07/29/2022 Start Time: 1300 End Time: 1400   Type of Therapy/Topic:  Group Therapy:  Balance in Life  Participation Level:  Did Not Attend   Description of Group:    This group will address the concept of balance and how it feels and looks when one is unbalanced. Patients will be encouraged to process areas in their lives that are out of balance, and identify reasons for remaining unbalanced. Facilitators will guide patients utilizing problem- solving interventions to address and correct the stressor making their life unbalanced. Understanding and applying boundaries will be explored and addressed for obtaining  and maintaining a balanced life. Patients will be encouraged to explore ways to assertively make their unbalanced needs known to significant others in their lives, using other group members and facilitator for support and feedback.  Therapeutic Goals: Patient will identify two or more emotions or situations they have that consume much of in their lives. Patient will identify signs/triggers that life has become out of balance:  Patient will identify two ways to set boundaries in order to achieve balance in their lives:  Patient will demonstrate ability to communicate their needs through discussion and/or role plays  Summary of Patient Progress: X   Therapeutic Modalities:   Cognitive Behavioral Therapy Solution-Focused Therapy Assertiveness Training   Coulter Oldaker R Danashia Landers, LCSW 

## 2022-07-30 DIAGNOSIS — F203 Undifferentiated schizophrenia: Secondary | ICD-10-CM | POA: Diagnosis not present

## 2022-07-30 NOTE — Group Note (Signed)
LCSW Group Therapy Note  Group Date: 07/30/2022 Start Time: 1300 End Time: 1400   Type of Therapy and Topic:  Group Therapy - Healthy vs Unhealthy Coping Skills  Participation Level:  Did Not Attend   Description of Group The focus of this group was to determine what unhealthy coping techniques typically are used by group members and what healthy coping techniques would be helpful in coping with various problems. Patients were guided in becoming aware of the differences between healthy and unhealthy coping techniques. Patients were asked to identify 2-3 healthy coping skills they would like to learn to use more effectively.  Therapeutic Goals Patients learned that coping is what human beings do all day long to deal with various situations in their lives Patients defined and discussed healthy vs unhealthy coping techniques Patients identified their preferred coping techniques and identified whether these were healthy or unhealthy Patients determined 2-3 healthy coping skills they would like to become more familiar with and use more often. Patients provided support and ideas to each other   Summary of Patient Progress:  Patient did not attend group despite encouraged participation.     Therapeutic Modalities Cognitive Behavioral Therapy Motivational Interviewing  Kharisma Glasner W Octavion Mollenkopf, LCSWA 07/30/2022  3:25 PM   

## 2022-07-30 NOTE — Progress Notes (Signed)
D- Patient alert and oriented x 2-3. Affect flat/mood depressed. Denies SI/HI/AVH. Denies depression, anxiety other complaints. A- Scheduled medications administered to patient, per MD orders except for Pepcid and Colace he refuses. Support and encouragement provided.  Routine safety checks conducted every 15 minutes.  Patient informed to notify staff with problems or concerns. R- No adverse drug reactions noted. Patient compliant with medications and treatment plan. Patient receptive, calm, and cooperative. He is isolative to his room except for meals. He contracts for safety and remains safe on the unit at this time.

## 2022-07-30 NOTE — Progress Notes (Signed)
Patient continues to be isolative to his room. Denies Pain or discomfort denies SI/HI/A/VH and verbally contracted for safety. Support and encouragement provided.

## 2022-07-30 NOTE — Group Note (Signed)
Date:  07/30/2022 Time:  6:55 PM  Group Topic/Focus:  Goals Group and Activity Group    Participation Level:  Did Not Attend   Marlowe Lawes Travis Yazleen Molock 07/30/2022, 6:55 PM  

## 2022-07-30 NOTE — Progress Notes (Signed)
[facility]  07/30/2022 10:44 AM Travis Sandoval  MRN:  [id number]  Subjective: Follow-up patient with schizophrenia. He continues to be stable with no concerns.  Sleep and appetite are "good", "I just want to get out of here."  Evidently his guardian through DSS has agreed to be his payee and [facility].  Denies depression and anxiety.  No side effects form his medications, no changes made.  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 No pertinent surgical history. Family History:  Family History  Family history unknown: Yes   Family Psychiatric  History: See previous Social History:  Social History   Substance and Sexual Activity  Alcohol Use Not Currently     Social History   Substance and Sexual Activity  Drug Use Not Currently    Social History   Socioeconomic History   Marital status: Unknown    Spouse name: Not on file   Number of children: Not on file   Years of education: Not on file   Highest education level: Not on file  Occupational History   Not on file  Tobacco Use   Smoking status: Every Day    Packs/day: 0.25    Years: 15.00    Additional pack years: 0.00    Total pack years: 3.75    Types: Cigarettes   Smokeless tobacco: Not on file  Vaping Use   Vaping Use: Not on file  Substance and Sexual Activity   Alcohol use: Not Currently   Drug use: Not Currently   Sexual activity: Not Currently  Other Topics Concern   Not on file  Social History Narrative   Not on file   Social Determinants of Health   Financial Resource Strain: Not on file  Food  Insecurity: Patient Declined (05/18/2022)   Hunger Vital Sign    Worried About Running Out of Food in the Last Year: Patient declined    Ran Out of Food in the Last Year: Patient declined  Transportation Needs: Patient Declined (05/18/2022)   PRAPARE - Administrator, Civil ServiceTransportation    Lack of Transportation (Medical): Patient declined    Lack of Transportation (Non-Medical): Patient declined  Physical Activity: Not on file  Stress: Not on file  Social Connections: Not on file   Additional Social History:     Sleep: Good  Appetite:  Good  Current Medications: Current Facility-Administered Medications  Medication Dose Route Frequency Provider Last Rate Last Admin   acetaminophen (TYLENOL) tablet 650 mg  650 mg Oral Q6H PRN Vanetta MuldersBarthold, Louise F, NP       alum & mag hydroxide-simeth (MAALOX/MYLANTA) 200-200-20 MG/5ML suspension 30 mL  30 mL Oral Q4H PRN Gabriel CirriBarthold, Louise F, NP   30 mL at 07/28/22 2132   ARIPiprazole ER (ABILIFY MAINTENA) injection 400 mg  400 mg Intramuscular Q28 days [name], [name], MD   400 mg at 07/25/22 1501   cloZAPine (CLOZARIL) tablet 350 mg  350 mg Oral QHS [name], [name], MD   350 mg at 07/29/22 2106   docusate sodium (COLACE) capsule 200 mg  200 mg Oral BID [name], Jackquline DenmarkJohn T, MD  200 mg at 07/27/22 0939   famotidine (PEPCID) tablet 20 mg  20 mg Oral BID [name], [name], MD   20 mg at 07/20/22 1740   hydrOXYzine (ATARAX) tablet 50 mg  50 mg Oral Q6H PRN [name], [name], MD   50 mg at 07/20/22 2102   magnesium citrate solution 1 Bottle  1 Bottle Oral Daily PRN Jaynie Bream, RPH   1 Bottle at 07/19/22 1643   magnesium hydroxide (MILK OF MAGNESIA) suspension 30 mL  30 mL Oral Daily PRN Jaynie Bream, RPH   30 mL at 07/18/22 1718   mineral oil liquid   Oral Daily PRN Sarina Ill, DO   Given at 07/17/22 1201   nicotine polacrilex (NICORETTE) gum 2 mg  2 mg Oral PRN [name], [name], MD   2 mg at 04/23/22 2200   polyethylene glycol (MIRALAX / GLYCOLAX) packet 17 g   17 g Oral Daily [name], [name], MD   17 g at 07/30/22 0911   senna-docusate (Senokot-S) tablet 2 tablet  2 tablet Oral Daily PRN [name], [name], MD   2 tablet at 07/14/22 0809   ziprasidone (GEODON) injection 20 mg  20 mg Intramuscular Q12H PRN Reggie Pile, MD        Lab Results:  No results found for this or any previous visit (from the past 48 hour(s)).   Blood Alcohol level:  Lab Results  Component Value Date   ETH <10 04/15/2022    Metabolic Disorder Labs: Lab Results  Component Value Date   HGBA1C 5.5 04/24/2022   MPG 111 04/24/2022   No results found for: "PROLACTIN" Lab Results  Component Value Date   CHOL 220 (H) 04/24/2022   TRIG 700 (H) 04/24/2022   HDL 35 (L) 04/24/2022   CHOLHDL 6.3 04/24/2022   VLDL UNABLE TO CALCULATE IF TRIGLYCERIDE OVER 400 mg/dL 16/01/9603   LDLCALC UNABLE TO CALCULATE IF TRIGLYCERIDE OVER 400 mg/dL 54/12/8117    Physical Findings: AIMS: Facial and Oral Movements Muscles of Facial Expression: None, normal Lips and Perioral Area: None, normal Jaw: None, normal Tongue: None, normal,Extremity Movements Upper (arms, wrists, hands, fingers): None, normal Lower (legs, knees, ankles, toes): None, normal, Trunk Movements Neck, shoulders, hips: None, normal, Overall Severity Severity of abnormal movements (highest score from questions above): None, normal Incapacitation due to abnormal movements: None, normal Patient's awareness of abnormal movements (rate only patient's report): No Awareness, Dental Status Current problems with teeth and/or dentures?: No Does patient usually wear dentures?: No  CIWA:    COWS:     Musculoskeletal: Strength & Muscle Tone: within normal limits Gait & Station: normal Patient leans: N/A  Psychiatric Specialty Exam: Physical Exam Vitals and nursing note reviewed.  Constitutional:      Appearance: Normal appearance.  HENT:     Head: Normocephalic.     Nose: Nose normal.  Pulmonary:     Effort:  Pulmonary effort is normal.  Musculoskeletal:        General: Normal range of motion.     Cervical back: Normal range of motion.  Neurological:     General: No focal deficit present.     Mental Status: He is alert and oriented to person, place, and time.  Psychiatric:        Attention and Perception: Attention normal.        Mood and Affect: Mood normal.        Speech: Speech normal.  Behavior: Behavior normal.        Thought Content: Thought content normal.        Cognition and Memory: Cognition normal.        Judgment: Judgment normal.     Review of Systems  Constitutional: Negative.   HENT: Negative.    Eyes: Negative.   Respiratory: Negative.    Cardiovascular: Negative.   Gastrointestinal: Negative.   Musculoskeletal: Negative.   Skin: Negative.   Neurological: Negative.   Psychiatric/Behavioral: Negative.      Blood pressure 107/81, pulse (!) 103, temperature 97.7 F (36.5 C), temperature source Oral, resp. rate (!) 21, height 5\' 9"  (1.753 m), weight 96.4 kg, SpO2 96 %.Body mass index is 31.38 kg/m.  General Appearance: Disheveled  Eye Contact:  Good  Speech:  Normal Rate  Volume:  Normal  Mood:  Euthymic  Affect:  Blunt  Thought Process:  Coherent  Orientation:  Full (Time, Place, and Person)  Thought Content:  Logical  Suicidal Thoughts:  No  Homicidal Thoughts:  No  Memory:  Immediate;   Fair Recent;   Fair Remote;   Fair  Judgement:  Good  Insight:  Fair  Psychomotor Activity:  Normal  Concentration:  Concentration: Good and Attention Span: Good  Recall:  [employer] of Knowledge:  Fair  Language:  Good  Akathisia:  No  Handed:  Right  AIMS (if indicated):     Assets:  Leisure Time Physical Health Resilience Social Support  ADL's:  Intact  Cognition:  WNL  Sleep:         Physical Exam: Physical Exam Vitals and nursing note reviewed.  Constitutional:      Appearance: Normal appearance.  HENT:     Head: Normocephalic.     Nose:  Nose normal.  Pulmonary:     Effort: Pulmonary effort is normal.  Musculoskeletal:        General: Normal range of motion.     Cervical back: Normal range of motion.  Neurological:     General: No focal deficit present.     Mental Status: He is alert and oriented to person, place, and time.  Psychiatric:        Attention and Perception: Attention normal.        Mood and Affect: Mood normal.        Speech: Speech normal.        Behavior: Behavior normal.        Thought Content: Thought content normal.        Cognition and Memory: Cognition normal.        Judgment: Judgment normal.    Review of Systems  Constitutional: Negative.   HENT: Negative.    Eyes: Negative.   Respiratory: Negative.    Cardiovascular: Negative.   Gastrointestinal: Negative.   Musculoskeletal: Negative.   Skin: Negative.   Neurological: Negative.   Psychiatric/Behavioral: Negative.     Blood pressure 107/81, pulse (!) 103, temperature 97.7 F (36.5 C), temperature source Oral, resp. rate (!) 21, height 5\' 9"  (1.753 m), weight 96.4 kg, SpO2 96 %. Body mass index is 31.38 kg/m.   Treatment Plan Summary: Medication management and Plan no change to current plan.  Waiting for discharge options and finances nothing new about the plan.  He continues to be here entirely so that we can find placement for him given that he is not safe for discharge to a shelter Schizophrenia, undifferentiated: Clozaril 350 mg daily Abilify Maintena 400 mg monthly  Anxiety:  Hydroxyzine 50 mg every 6 hours PRN   Nanine Means, NP 07/30/2022, 10:44 AM

## 2022-07-30 NOTE — Group Note (Signed)
Recreation Therapy Group Note   Group Topic:Relaxation  Group Date: 07/30/2022 Start Time: 1000 End Time: 1050 Facilitators: Rosina Lowenstein, LRT, CTRS Location:  Craft Room  Group Description: Meditation. LRT asks patients their current level of stress/anxiety from 1-10, with 10 being the highest. LRT educated on the benefits of mindfulness and how it can apply to everyday life post-discharge. LRT and pt's followed along to an audio script of a "guided meditation" video. LRT asked pt their level of stress and anxiety once the prompt was finished. LRT facilitated post-activity processing to gain feedback on session.  Affect/Mood: N/A   Participation Level: Did not attend    Clinical Observations/Individualized Feedback: Travis Sandoval did not attend group due to resting in his room.  Plan: Continue to engage patient in RT group sessions 2-3x/week.   Rosina Lowenstein, LRT, CTRS 07/30/2022 11:43 AM

## 2022-07-31 LAB — CBC WITH DIFFERENTIAL/PLATELET
Abs Immature Granulocytes: 0.03 10*3/uL (ref 0.00–0.07)
Basophils Absolute: 0 10*3/uL (ref 0.0–0.1)
Basophils Relative: 0 %
Eosinophils Absolute: 0.1 10*3/uL (ref 0.0–0.5)
Eosinophils Relative: 2 %
HCT: 45.9 % (ref 39.0–52.0)
Hemoglobin: 14.7 g/dL (ref 13.0–17.0)
Immature Granulocytes: 0 %
Lymphocytes Relative: 32 %
Lymphs Abs: 2.9 10*3/uL (ref 0.7–4.0)
MCH: 28.5 pg (ref 26.0–34.0)
MCHC: 32 g/dL (ref 30.0–36.0)
MCV: 89.1 fL (ref 80.0–100.0)
Monocytes Absolute: 0.7 10*3/uL (ref 0.1–1.0)
Monocytes Relative: 7 %
Neutro Abs: 5.4 10*3/uL (ref 1.7–7.7)
Neutrophils Relative %: 59 %
Platelets: 208 10*3/uL (ref 150–400)
RBC: 5.15 MIL/uL (ref 4.22–5.81)
RDW: 12.7 % (ref 11.5–15.5)
WBC: 9.1 10*3/uL (ref 4.0–10.5)
nRBC: 0 % (ref 0.0–0.2)

## 2022-07-31 NOTE — Progress Notes (Signed)
[facility] MD Progress Note  07/31/2022 3:36 PM Travis Sandoval  MRN:  [id number] Subjective: Follow-up patient with schizophrenia.  Unchanged.  No complaints.  Continues to stay in bed.  No change in complaints no behavior change Principal Problem: Undifferentiated schizophrenia Diagnosis: Principal Problem:   Undifferentiated schizophrenia  Total Time spent with patient: 30 minutes  Past Psychiatric History: Past history of schizophrenia  Past Medical History:  Past Medical History:  Diagnosis Date   ADHD    Bipolar 1 disorder (HCC)    Hepatitis C    Schizoaffective disorder (HCC)    History reviewed. No pertinent surgical history. Family History:  Family History  Family history unknown: Yes   Family Psychiatric  History: See previous Social History:  Social History   Substance and Sexual Activity  Alcohol Use Not Currently     Social History   Substance and Sexual Activity  Drug Use Not Currently    Social History   Socioeconomic History   Marital status: Unknown    Spouse name: Not on file   Number of children: Not on file   Years of education: Not on file   Highest education level: Not on file  Occupational History   Not on file  Tobacco Use   Smoking status: Every Day    Packs/day: 0.25    Years: 15.00    Additional pack years: 0.00    Total pack years: 3.75    Types: Cigarettes   Smokeless tobacco: Not on file  Vaping Use   Vaping Use: Not on file  Substance and Sexual Activity   Alcohol use: Not Currently   Drug use: Not Currently   Sexual activity: Not Currently  Other Topics Concern   Not on file  Social History Narrative   Not on file   Social Determinants of Health   Financial Resource Strain: Not on file  Food Insecurity: Patient Declined (05/18/2022)   Hunger Vital Sign    Worried About Running Out of Food in the Last Year: Patient declined    Ran Out of Food in the Last Year: Patient declined  Transportation Needs: Patient Declined  (05/18/2022)   PRAPARE - Administrator, Civil Service (Medical): Patient declined    Lack of Transportation (Non-Medical): Patient declined  Physical Activity: Not on file  Stress: Not on file  Social Connections: Not on file   Additional Social History:                         Sleep: Fair  Appetite:  Fair  Current Medications: Current Facility-Administered Medications  Medication Dose Route Frequency Provider Last Rate Last Admin   acetaminophen (TYLENOL) tablet 650 mg  650 mg Oral Q6H PRN Vanetta Mulders, NP       alum & mag hydroxide-simeth (MAALOX/MYLANTA) 200-200-20 MG/5ML suspension 30 mL  30 mL Oral Q4H PRN Gabriel Cirri F, NP   30 mL at 07/30/22 2124   ARIPiprazole ER (ABILIFY MAINTENA) injection 400 mg  400 mg Intramuscular Q28 days Lyfe Monger T, MD   400 mg at 07/25/22 1501   cloZAPine (CLOZARIL) tablet 350 mg  350 mg Oral QHS Jasyn Mey T, MD   350 mg at 07/30/22 2123   docusate sodium (COLACE) capsule 200 mg  200 mg Oral BID Azaiah Mello T, MD   200 mg at 07/27/22 0939   famotidine (PEPCID) tablet 20 mg  20 mg Oral BID Mykenzi Vanzile, [name], MD   20  mg at 07/20/22 1740   hydrOXYzine (ATARAX) tablet 50 mg  50 mg Oral Q6H PRN Demonta Wombles, [name], MD   50 mg at 07/20/22 2102   magnesium citrate solution 1 Bottle  1 Bottle Oral Daily PRN Jaynie Bream, RPH   1 Bottle at 07/19/22 1643   magnesium hydroxide (MILK OF MAGNESIA) suspension 30 mL  30 mL Oral Daily PRN Jaynie Bream, RPH   30 mL at 07/18/22 1718   mineral oil liquid   Oral Daily PRN Sarina Ill, DO   Given at 07/17/22 1201   nicotine polacrilex (NICORETTE) gum 2 mg  2 mg Oral PRN Nyesha Cliff, [name], MD   2 mg at 04/23/22 2200   polyethylene glycol (MIRALAX / GLYCOLAX) packet 17 g  17 g Oral Daily Kenijah Benningfield, [name], MD   17 g at 07/30/22 0911   senna-docusate (Senokot-S) tablet 2 tablet  2 tablet Oral Daily PRN Breckon Reeves, [name], MD   2 tablet at 07/14/22 0809   ziprasidone  (GEODON) injection 20 mg  20 mg Intramuscular Q12H PRN Reggie Pile, MD        Lab Results:  Results for orders placed or performed during the hospital encounter of 04/16/22 (from the past 48 hour(s))  CBC with Differential/Platelet     Status: None   Collection Time: 07/31/22 11:18 AM  Result Value Ref Range   WBC 9.1 4.0 - 10.5 K/uL   RBC 5.15 4.22 - 5.81 MIL/uL   Hemoglobin 14.7 13.0 - 17.0 g/dL   HCT 32.9 51.8 - 84.1 %   MCV 89.1 80.0 - 100.0 fL   MCH 28.5 26.0 - 34.0 pg   MCHC 32.0 30.0 - 36.0 g/dL   RDW 66.0 63.0 - 16.0 %   Platelets 208 150 - 400 K/uL   nRBC 0.0 0.0 - 0.2 %   Neutrophils Relative % 59 %   Neutro Abs 5.4 1.7 - 7.7 K/uL   Lymphocytes Relative 32 %   Lymphs Abs 2.9 0.7 - 4.0 K/uL   Monocytes Relative 7 %   Monocytes Absolute 0.7 0.1 - 1.0 K/uL   Eosinophils Relative 2 %   Eosinophils Absolute 0.1 0.0 - 0.5 K/uL   Basophils Relative 0 %   Basophils Absolute 0.0 0.0 - 0.1 K/uL   Immature Granulocytes 0 %   Abs Immature Granulocytes 0.03 0.00 - 0.07 K/uL    Comment: Performed at [facility], [address] [place], [place] [address]     Blood Alcohol level:  Lab Results  Component Value Date   [facility] <10 04/15/2022    Metabolic Disorder Labs: Lab Results  Component Value Date   HGBA1C 5.5 04/24/2022   MPG 111 04/24/2022   No results found for: "PROLACTIN" Lab Results  Component Value Date   CHOL 220 (H) 04/24/2022   TRIG 700 (H) 04/24/2022   HDL 35 (L) 04/24/2022   CHOLHDL 6.3 04/24/2022   VLDL UNABLE TO CALCULATE IF TRIGLYCERIDE OVER 400 mg/dL 35/57/3220   LDLCALC UNABLE TO CALCULATE IF TRIGLYCERIDE OVER 400 mg/dL 25/42/7062    Physical Findings: AIMS: Facial and Oral Movements Muscles of Facial Expression: None, normal Lips and Perioral Area: None, normal Jaw: None, normal Tongue: None, normal,Extremity Movements Upper (arms, wrists, hands, fingers): None, normal Lower (legs, knees, ankles, toes): None, normal, Trunk  Movements Neck, shoulders, hips: None, normal, Overall Severity Severity of abnormal movements (highest score from questions above): None, normal Incapacitation due to abnormal movements: None, normal Patient's  awareness of abnormal movements (rate only patient's report): No Awareness, Dental Status Current problems with teeth and/or dentures?: No Does patient usually wear dentures?: No  CIWA:    COWS:     Musculoskeletal: Strength & Muscle Tone: within normal limits Gait & Station: normal Patient leans: N/A  Psychiatric Specialty Exam:  Presentation  General Appearance:  Disheveled  Eye Contact: Minimal  Speech: Pressured  Speech Volume: Normal  Handedness: Right   Mood and Affect  Mood: Euphoric; Irritable  Affect: Inappropriate   Thought Process  Thought Processes: Disorganized  Descriptions of Associations:Loose  Orientation:Partial  Thought Content:Illogical; Scattered  History of Schizophrenia/Schizoaffective disorder:No data recorded Duration of Psychotic Symptoms:No data recorded Hallucinations:No data recorded Ideas of Reference:Delusions; Paranoia  Suicidal Thoughts:No data recorded Homicidal Thoughts:No data recorded  Sensorium  Memory: Immediate Poor; Recent Poor; Remote Poor  Judgment: Poor  Insight: Poor   Executive Functions  Concentration: Fair  Attention Span: Fair  Recall: Poor  Fund of Knowledge: Poor  Language: Poor   Psychomotor Activity  Psychomotor Activity:No data recorded  Assets  Assets: Desire for Improvement; Social Support   Sleep  Sleep:No data recorded   Physical Exam: Physical Exam Vitals and nursing note reviewed.  Constitutional:      Appearance: Normal appearance.  HENT:     Head: Normocephalic and atraumatic.     Mouth/Throat:     Pharynx: Oropharynx is clear.  Eyes:     Pupils: Pupils are equal, round, and reactive to light.  Cardiovascular:     Rate and Rhythm: Normal  rate and regular rhythm.  Pulmonary:     Effort: Pulmonary effort is normal.     Breath sounds: Normal breath sounds.  Abdominal:     General: Abdomen is flat.     Palpations: Abdomen is soft.  Musculoskeletal:        General: Normal range of motion.  Skin:    General: Skin is warm and dry.  Neurological:     General: No focal deficit present.     Mental Status: He is alert. Mental status is at baseline.  Psychiatric:        Attention and Perception: Attention normal.        Mood and Affect: Mood normal.        Speech: Speech normal.        Behavior: Behavior normal.        Thought Content: Thought content normal.        Cognition and Memory: Cognition normal.        Judgment: Judgment normal.    Review of Systems  Constitutional: Negative.   HENT: Negative.    Eyes: Negative.   Respiratory: Negative.    Cardiovascular: Negative.   Gastrointestinal: Negative.   Musculoskeletal: Negative.   Skin: Negative.   Neurological: Negative.   Psychiatric/Behavioral: Negative.     Blood pressure 114/76, pulse 96, temperature (!) 97.5 F (36.4 C), temperature source Oral, resp. rate (!) 22, height 5\' 9"  (1.753 m), weight 96.4 kg, SpO2 97 %. Body mass index is 31.38 kg/m.   Treatment Plan Summary: Medication management and Plan no change to current plan.  Waiting for discharge options and finances nothing new about the plan.  He continues to be here entirely so that we can find placement for him given that he is not safe for discharge to a shelter.  Stable just waiting on placement.  Mordecai RasmussenJohn Searra Carnathan, MD 07/31/2022, 3:36 PM

## 2022-07-31 NOTE — Progress Notes (Signed)
Patient calm and pleasant during assessment denying SI/HI/AVH. Pt compliant with medication administration per MD orders. Pt given education, support, and encouragement to be active in his treatment plan. Pt being monitored Q 15 minutes for safety per unit protocol, remains safe on the unit  

## 2022-07-31 NOTE — Consult Note (Signed)
Pharmacy Consult - Clozapine     42 yo male  This patient's order has been reviewed for prescribing contraindications.    Clozapine [facility] enrollment Verified: yes on 04/17/22 [facility] patient ID: [id number] Current Outpatient Monitoring: Every 2 weeks   Home Regimen:  225 mg po BID Last dose: unknown   Dose Adjustments This Admission: Dose started at clozapine 100 mg po daily Dose is currently clozapine 350 mg po HS (started 3/12)   Labs: Date    ANC    Submitted? 12/27 2800 Yes 01/03 3500 Yes 01/10   3100 Yes 01/24 3000 Yes 01/31 4400 yes   02/07 4300 yes 02/14 4500 Yes 02/21 5500 Yes 02/28 4080 Yes 03/06 4300 Yes  03/13 4100 Yes 03/20 4800 Yes 03/27 4800 Yes 04/03 4200 Yes 04/10 5400 Yes  Plan: Continue clozapine 350 mg po HS Monitor ANC at least weekly while inpatient. Next CBC with differential on 08/07/22.   Jaynie Bream, PharmD, BCPS Clinical Pharmacist   07/31/2022 11:50 AM

## 2022-07-31 NOTE — Group Note (Signed)
Date:  07/31/2022 Time:  9:47 AM  Group Topic/Focus:  Community Meeting    Participation Level:  Did Not Attend   Neomi Laidler Travis Sendy Pluta 07/31/2022, 9:47 AM  
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
## 2022-07-31 NOTE — Plan of Care (Signed)
D: Patient alert and oriented. Patient denies pain. Patient denies anxiety and depression. Patient denies SI/HI/AVH. Patient refused all scheduled medication during this shift. Patient remains isolative to room with exception to coming out for meals.  A: Scheduled medications administered to patient, per MD orders.  Support and encouragement provided to patient.  Q15 minute safety checks maintained.   R: Patient compliant with medication administration and treatment plan. No adverse drug reactions noted. Patient remains safe on the unit at this time. Problem: Education: Goal: Knowledge of General Education information will improve Description: Including pain rating scale, medication(s)/side effects and non-pharmacologic comfort measures Outcome: Progressing   Problem: Clinical Measurements: Goal: Ability to maintain clinical measurements within normal limits will improve Outcome: Progressing

## 2022-07-31 NOTE — BH IP Treatment Plan (Signed)
Interdisciplinary Treatment and Diagnostic Plan Update  07/31/2022 Time of Session: 8:30AM Logon Olshefski MRN: [id number]  Principal Diagnosis: Undifferentiated schizophrenia  Secondary Diagnoses: Principal Problem:   Undifferentiated schizophrenia   Current Medications:  Current Facility-Administered Medications  Medication Dose Route Frequency Provider Last Rate Last Admin   acetaminophen (TYLENOL) tablet 650 mg  650 mg Oral Q6H PRN Vanetta Mulders, NP       alum & mag hydroxide-simeth (MAALOX/MYLANTA) 200-200-20 MG/5ML suspension 30 mL  30 mL Oral Q4H PRN Gabriel Cirri F, NP   30 mL at 07/30/22 2124   ARIPiprazole ER (ABILIFY MAINTENA) injection 400 mg  400 mg Intramuscular Q28 days [name], [name], MD   400 mg at 07/25/22 1501   cloZAPine (CLOZARIL) tablet 350 mg  350 mg Oral QHS [name], [name], MD   350 mg at 07/30/22 2123   docusate sodium (COLACE) capsule 200 mg  200 mg Oral BID [name], [name], MD   200 mg at 07/27/22 0939   famotidine (PEPCID) tablet 20 mg  20 mg Oral BID [name], [name], MD   20 mg at 07/20/22 1740   hydrOXYzine (ATARAX) tablet 50 mg  50 mg Oral Q6H PRN [name], [name], MD   50 mg at 07/20/22 2102   magnesium citrate solution 1 Bottle  1 Bottle Oral Daily PRN Jaynie Bream, RPH   1 Bottle at 07/19/22 1643   magnesium hydroxide (MILK OF MAGNESIA) suspension 30 mL  30 mL Oral Daily PRN Jaynie Bream, RPH   30 mL at 07/18/22 1718   mineral oil liquid   Oral Daily PRN Sarina Ill, DO   Given at 07/17/22 1201   nicotine polacrilex (NICORETTE) gum 2 mg  2 mg Oral PRN [name], [name], MD   2 mg at 04/23/22 2200   polyethylene glycol (MIRALAX / GLYCOLAX) packet 17 g  17 g Oral Daily [name], [name], MD   17 g at 07/30/22 0911   senna-docusate (Senokot-S) tablet 2 tablet  2 tablet Oral Daily PRN [name], [name], MD   2 tablet at 07/14/22 0809   ziprasidone (GEODON) injection 20 mg  20 mg Intramuscular Q12H PRN Reggie Pile, MD       PTA  Medications: Medications Prior to Admission  Medication Sig Dispense Refill Last Dose   ABILIFY MAINTENA 400 MG SRER injection Inject 400 mg into the muscle every 28 (twenty-eight) days.      ARIPiprazole (ABILIFY) 10 MG tablet Take 10 mg by mouth daily. (Patient not taking: Reported on 04/16/2022)      atomoxetine (STRATTERA) 40 MG capsule Take 40 mg by mouth daily. (Patient not taking: Reported on 04/16/2022)      atorvastatin (LIPITOR) 40 MG tablet Take 40 mg by mouth daily.      budesonide-formoterol (SYMBICORT) 160-4.5 MCG/ACT inhaler Inhale 2 puffs into the lungs.      cetirizine (ZYRTEC) 10 MG tablet Take 10 mg by mouth daily.      clozapine (CLOZARIL) 200 MG tablet Take 200 mg by mouth 2 (two) times daily. Take along with one 25 mg tablet for total 225 mg twice daily      cloZAPine (CLOZARIL) 25 MG tablet Take 25 mg by mouth 2 (two) times daily. Take along with one 200 mg tablet for total 225 mg twice daily      Dexlansoprazole (DEXILANT) 30 MG capsule Take 30 mg by mouth daily. (Patient not taking: Reported on 04/16/2022)  hydrOXYzine (VISTARIL) 50 MG capsule Take 50 mg by mouth 3 (three) times daily as needed for itching. (Patient not taking: Reported on 04/16/2022)      Melatonin 5 MG TABS Take 5 mg by mouth at bedtime as needed (sleep).      metoprolol succinate (TOPROL-XL) 25 MG 24 hr tablet Take 12.5 mg by mouth daily.      Phosphatidyl Choline 65 MG TABS Take 1 tablet by mouth daily.  (Patient not taking: Reported on 04/16/2022)      valproic acid (DEPAKENE) 250 MG/5ML solution Take 750-1,000 mLs by mouth 2 (two) times daily. 1000 mg (20 mL) every morning and 750 mg (15 mL) daily at bedtime       Patient Stressors: Other: Psychosis    Patient Strengths: Ability for insight  Active sense of humor  Average or above average intelligence  Capable of independent living  Supportive family/friends   Treatment Modalities: Medication Management, Group therapy, Case management,   1 to 1 session with clinician, Psychoeducation, Recreational therapy.   Physician Treatment Plan for Primary Diagnosis: Undifferentiated schizophrenia Long Term Goal(s):     Short Term Goals: None, pt is a poor hx.  Medication Management: Evaluate patient's response, side effects, and tolerance of medication regimen.  Therapeutic Interventions: 1 to 1 sessions, Unit Group sessions and Medication administration.  Evaluation of Outcomes: Progressing  Physician Treatment Plan for Secondary Diagnosis: Principal Problem:   Undifferentiated schizophrenia  Long Term Goal(s):     Short Term Goals: None, pt is a poor hx.     Medication Management: Evaluate patient's response, side effects, and tolerance of medication regimen.  Therapeutic Interventions: 1 to 1 sessions, Unit Group sessions and Medication administration.  Evaluation of Outcomes: Progressing   RN Treatment Plan for Primary Diagnosis: Undifferentiated schizophrenia Long Term Goal(s): Knowledge of disease and therapeutic regimen to maintain health will improve  Short Term Goals: Ability to demonstrate self-control, Ability to participate in decision making will improve, Ability to verbalize feelings will improve, Ability to disclose and discuss suicidal ideas, Ability to identify and develop effective coping behaviors will improve, and Compliance with prescribed medications will improve  Medication Management: RN will administer medications as ordered by provider, will assess and evaluate patient's response and provide education to patient for prescribed medication. RN will report any adverse and/or side effects to prescribing provider.  Therapeutic Interventions: 1 on 1 counseling sessions, Psychoeducation, Medication administration, Evaluate responses to treatment, Monitor vital signs and CBGs as ordered, Perform/monitor CIWA, COWS, AIMS and Fall Risk screenings as ordered, Perform wound care treatments as  ordered.  Evaluation of Outcomes: Progressing   LCSW Treatment Plan for Primary Diagnosis: Undifferentiated schizophrenia Long Term Goal(s): Safe transition to appropriate next level of care at discharge, Engage patient in therapeutic group addressing interpersonal concerns.  Short Term Goals: Engage patient in aftercare planning with referrals and resources, Increase social support, Increase ability to appropriately verbalize feelings, Increase emotional regulation, Facilitate acceptance of mental health diagnosis and concerns, and Increase skills for wellness and recovery  Therapeutic Interventions: Assess for all discharge needs, 1 to 1 time with Social worker, Explore available resources and support systems, Assess for adequacy in community support network, Educate family and significant other(s) on suicide prevention, Complete Psychosocial Assessment, Interpersonal group therapy.  Evaluation of Outcomes: Progressing   Progress in Treatment: Attending groups: No. Participating in groups: No. Taking medication as prescribed: Yes. Toleration medication: Yes. Family/Significant other contact made: Yes, individual(s) contacted:  SPE completed with the patient's father  and with patient's guardian Patient understands diagnosis: No. Discussing patient identified problems/goals with staff: Yes. Medical problems stabilized or resolved: Yes. Denies suicidal/homicidal ideation: Yes. Issues/concerns per patient self-inventory: No. Other: none  New problem(s) identified: No, Describe:  none Update 06/30/2021:  No changes at this time. Update 07/06/2022:  No changes at this time. Update 07/11/22: No changes at this time. Update 07/16/22: none. Update 07/21/22: none. Update 07/26/22: No changes at this time.  Update 07/31/2022:  No changes at this time.    New Short Term/Long Term Goal(s): elimination of symptoms of psychosis, medication management for mood stabilization; elimination of SI thoughts;  development of comprehensive mental wellness plan. Update 06/30/2021:  No changes at this time. Update 07/06/2022:  No changes at this time. Update 07/11/22: No changes at this time. Update 07/16/22: Patient to work towards elimination of symptoms of psychosis, medication management for mood stabilization; development of comprehensive mental wellness plan. Update 07/21/22: Patient to work towards elimination of symptoms of psychosis, medication management for mood stabilization; development of comprehensive mental wellness plan. Update 07/26/22: No changes at this time.  Update 07/31/2022:  No changes at this time.    Patient Goals:  No additional treatment goals identified by patient. Update 06/30/2021:  No changes at this time. Update 07/06/2022:  No changes at this time. Update 07/11/22: No changes at this time. Update 07/16/22: No additional goals identified at this time. Patient to continue to work towards original goals identified in initial treatment team meeting. CSW will remain available to patient should they voice additional treatment goals. Update 07/21/22: No additional goals identified at this time. Patient to continue to work towards original goals identified in initial treatment team meeting. CSW will remain available to patient should they voice additional treatment goals. Update 07/26/22: No changes at this time.  Update 07/31/2022:  No changes at this time.    Discharge Plan or Barriers: Patient remains on the unit as placement continues to be sought. Funding remains the concern at this time and biggest barrier to placement at this time. Update 06/30/2021:  No changes at this time. Update 07/06/2022:  No changes at this time. Update 07/11/22: No changes at this time. Update 07/16/22: Patient lacks funding for placement, [employer] rep continues to communicate with social security to restart payment. Patient has guardianship hearing on this day. Situation ongoing, CSW will continue to monitor and update note as more  information becomes available. Update 07/21/22: Patient lacks adequate funding, CSW to continue pursing payor source to secure placement. Patient is unable to living independently per psychiatrist. Update 07/26/22: No changes at this time. Update 07/31/2022:  No changes at this time.     Reason for Continuation of Hospitalization: Anxiety Delusions  Depression Hallucinations   Estimated Length of Stay: TBD Update 07/06/2022:  TBD Update 07/11/22: No changes at this time. Update 07/16/22: TBD. Update 07/21/22: TBD. Update 07/26/22: No changes at this time.  Update 07/31/2022:  No changes at this time.   Last 3 [place] Suicide Severity Risk Score: Flowsheet Row [facility] (Current[facility]  C-SSRS RISK CATEGORY No Risk No Risk       Last PHQ 2/9 Scores:     No data to display          Scribe for Treatment Team: Harden MoMichaela J Faiz Weber, Alexander MtLCSW 07/31/2022 9:43 AM

## 2022-08-01 DIAGNOSIS — F203 Undifferentiated schizophrenia: Secondary | ICD-10-CM | POA: Diagnosis not present

## 2022-08-01 NOTE — Group Note (Signed)
[facility] LCSW Group Therapy Note   Group Date: 08/01/2022 Start Time: 1300 End Time: 1400   Type of Therapy/Topic:  Group Therapy:  Emotion Regulation  Participation Level:  Did Not Attend    Description of Group:    The purpose of this group is to assist patients in learning to regulate negative emotions and experience positive emotions. Patients will be guided to discuss ways in which they have been vulnerable to their negative emotions. These vulnerabilities will be juxtaposed with experiences of positive emotions or situations, and patients challenged to use positive emotions to combat negative ones. Special emphasis will be placed on coping with negative emotions in conflict situations, and patients will process healthy conflict resolution skills.  Therapeutic Goals: Patient will identify two positive emotions or experiences to reflect on in order to balance out negative emotions:  Patient will label two or more emotions that they find the most difficult to experience:  Patient will be able to demonstrate positive conflict resolution skills through discussion or role plays:   Summary of Patient Progress: X   Therapeutic Modalities:   Cognitive Behavioral Therapy Feelings Identification Dialectical Behavioral Therapy   Kimisha Eunice R Kacie Huxtable, LCSW 

## 2022-08-01 NOTE — Progress Notes (Signed)
Patient calm and pleasant during assessment denying SI/HI/AVH. Pt compliant with medication administration per MD orders. Pt given education, support, and encouragement to be active in his treatment plan. Pt being monitored Q 15 minutes for safety per unit protocol, remains safe on the unit  

## 2022-08-01 NOTE — Plan of Care (Signed)
D: Patient alert and oriented. Patient denies pain. Patient denies anxiety and depression. Patient denies SI/HI/AVH. Patient refused scheduled medication during this shift. Patient remains isolative to room during shift with exception to coming out for meals.  A: Scheduled medications administered to patient, per MD orders.  Support and encouragement provided to patient.  Q15 minute safety checks maintained.   R: Patient compliant with medication administration and treatment plan. No adverse drug reactions noted. Patient remains safe on the unit at this time. Problem: Education: Goal: Knowledge of [place] General Education information/materials will improve Outcome: Progressing Goal: Verbalization of understanding the information provided will improve Outcome: Progressing

## 2022-08-01 NOTE — Group Note (Signed)
Recreation Therapy Group Note   Group Topic:Goal Setting  Group Date: 08/01/2022 Start Time: 1000 End Time: 1100 Facilitators: Rosina Lowenstein, LRT, CTRS Location:  Craft Room  Group description: Now Future Wall. Patients were given a sheet of paper and asked to fold it into 3 sections, like a pamphlet. Top section, patients were encouraged to write what they are feeling or experiencing "now". The bottom section, patients were asked to fill out how they want to feel or things they want to experience in the "future".  In the middle section, patients were encouraged to fill out any "walls" or barriers that are getting in the way of them reaching their "future". On the back of the sheet, patients were encouraged to write positive coping skills that will help them get over or though the walls they experience. LRT and patients discussed each of the sections and shared them aloud in group. Patients are encouraged to keep this paper with them as a guide/plan post discharge.   Goal Area(s) Addressed:  Patients will identify walls/triggers. Patients will identify and list coping skills to use post-discharge. Patients will work on goal setting, short or long-term. Patients will work on communication by reading aloud to group and engaging in post activity discussion.   Affect/Mood: N/A   Participation Level: Did not attend    Clinical Observations/Individualized Feedback: Travis Sandoval did not attend group due to resting in his room.  Plan: Continue to engage patient in RT group sessions 2-3x/week.   Rosina Lowenstein, LRT, CTRS 08/01/2022 11:37 AM

## 2022-08-01 NOTE — Progress Notes (Signed)
[facility]  08/01/2022 10:39 AM Travis Sandoval  MRN:  [id number] Subjective: Patient seen.  No complaints.  He still has constipation but is aware that he has multiple treatments for it that are available when he asks.  No report of any change to mental health issues.  No behavior problems. Principal Problem: Undifferentiated schizophrenia Diagnosis: Principal Problem:   Undifferentiated schizophrenia  Total Time spent with patient: 30 minutes  Past Psychiatric History: History of schizophrenia  Past Medical History:  Past Medical History:  Diagnosis Date   ADHD    Bipolar 1 disorder (HCC)    Hepatitis C    Schizoaffective disorder (HCC)    History reviewed. No pertinent surgical history. Family History:  Family History  Family history unknown: Yes   Family Psychiatric  History: See previous Social History:  Social History   Substance and Sexual Activity  Alcohol Use Not Currently     Social History   Substance and Sexual Activity  Drug Use Not Currently    Social History   Socioeconomic History   Marital status: Unknown    Spouse name: Not on file   Number of children: Not on file   Years of education: Not on file   Highest education level: Not on file  Occupational History   Not on file  Tobacco Use   Smoking status: Every Day    Packs/day: 0.25    Years: 15.00    Additional pack years: 0.00    Total pack years: 3.75    Types: Cigarettes   Smokeless tobacco: Not on file  Vaping Use   Vaping Use: Not on file  Substance and Sexual Activity   Alcohol use: Not Currently   Drug use: Not Currently   Sexual activity: Not Currently  Other Topics Concern   Not on file  Social History Narrative   Not on file   Social Determinants of Health   Financial Resource Strain: Not on file  Food Insecurity: Patient Declined (05/18/2022)   Hunger Vital Sign    Worried About Running Out of Food in the Last Year: Patient declined    Ran Out of Food in the  Last Year: Patient declined  Transportation Needs: Patient Declined (05/18/2022)   PRAPARE - Administrator, Civil ServiceTransportation    Lack of Transportation (Medical): Patient declined    Lack of Transportation (Non-Medical): Patient declined  Physical Activity: Not on file  Stress: Not on file  Social Connections: Not on file   Additional Social History:                         Sleep: Fair  Appetite:  Fair  Current Medications: Current Facility-Administered Medications  Medication Dose Route Frequency Provider Last Rate Last Admin   acetaminophen (TYLENOL) tablet 650 mg  650 mg Oral Q6H PRN Vanetta MuldersBarthold, Louise F, NP       alum & mag hydroxide-simeth (MAALOX/MYLANTA) 200-200-20 MG/5ML suspension 30 mL  30 mL Oral Q4H PRN Gabriel CirriBarthold, Louise F, NP   30 mL at 07/31/22 2105   ARIPiprazole ER (ABILIFY MAINTENA) injection 400 mg  400 mg Intramuscular Q28 days Breton Berns T, MD   400 mg at 07/25/22 1501   cloZAPine (CLOZARIL) tablet 350 mg  350 mg Oral QHS Ailen Strauch T, MD   350 mg at 07/31/22 2105   docusate sodium (COLACE) capsule 200 mg  200 mg Oral BID Amila Callies, Jackquline DenmarkJohn T, MD   200 mg at 07/27/22 0939   famotidine (  PEPCID) tablet 20 mg  20 mg Oral BID Amijah Timothy, [name], MD   20 mg at 07/20/22 1740   hydrOXYzine (ATARAX) tablet 50 mg  50 mg Oral Q6H PRN Terrianne Cavness, [name], MD   50 mg at 07/20/22 2102   magnesium citrate solution 1 Bottle  1 Bottle Oral Daily PRN Jaynie Bream, RPH   1 Bottle at 07/19/22 1643   magnesium hydroxide (MILK OF MAGNESIA) suspension 30 mL  30 mL Oral Daily PRN Jaynie Bream, RPH   30 mL at 07/18/22 1718   mineral oil liquid   Oral Daily PRN Sarina Ill, DO   Given at 07/17/22 1201   nicotine polacrilex (NICORETTE) gum 2 mg  2 mg Oral PRN Latravious Levitt, [name], MD   2 mg at 04/23/22 2200   polyethylene glycol (MIRALAX / GLYCOLAX) packet 17 g  17 g Oral Daily Harry Shuck, [name], MD   17 g at 07/30/22 0911   senna-docusate (Senokot-S) tablet 2 tablet  2 tablet Oral Daily PRN  Nashay Brickley, [name], MD   2 tablet at 07/14/22 0809   ziprasidone (GEODON) injection 20 mg  20 mg Intramuscular Q12H PRN Reggie Pile, MD        Lab Results:  Results for orders placed or performed during the hospital encounter of 04/16/22 (from the past 48 hour(s))  CBC with Differential/Platelet     Status: None   Collection Time: 07/31/22 11:18 AM  Result Value Ref Range   WBC 9.1 4.0 - 10.5 K/uL   RBC 5.15 4.22 - 5.81 MIL/uL   Hemoglobin 14.7 13.0 - 17.0 g/dL   HCT 03.5 59.7 - 41.6 %   MCV 89.1 80.0 - 100.0 fL   MCH 28.5 26.0 - 34.0 pg   MCHC 32.0 30.0 - 36.0 g/dL   RDW 38.4 53.6 - 46.8 %   Platelets 208 150 - 400 K/uL   nRBC 0.0 0.0 - 0.2 %   Neutrophils Relative % 59 %   Neutro Abs 5.4 1.7 - 7.7 K/uL   Lymphocytes Relative 32 %   Lymphs Abs 2.9 0.7 - 4.0 K/uL   Monocytes Relative 7 %   Monocytes Absolute 0.7 0.1 - 1.0 K/uL   Eosinophils Relative 2 %   Eosinophils Absolute 0.1 0.0 - 0.5 K/uL   Basophils Relative 0 %   Basophils Absolute 0.0 0.0 - 0.1 K/uL   Immature Granulocytes 0 %   Abs Immature Granulocytes 0.03 0.00 - 0.07 K/uL    Comment: Performed at [facility], [address] [place], [place] [address]    Blood Alcohol level:  Lab Results  Component Value Date   [facility] <10 04/15/2022    Metabolic Disorder Labs: Lab Results  Component Value Date   HGBA1C 5.5 04/24/2022   MPG 111 04/24/2022   No results found for: "PROLACTIN" Lab Results  Component Value Date   CHOL 220 (H) 04/24/2022   TRIG 700 (H) 04/24/2022   HDL 35 (L) 04/24/2022   CHOLHDL 6.3 04/24/2022   VLDL UNABLE TO CALCULATE IF TRIGLYCERIDE OVER 400 mg/dL 24/82/5003   LDLCALC UNABLE TO CALCULATE IF TRIGLYCERIDE OVER 400 mg/dL 70/48/8891    Physical Findings: AIMS: Facial and Oral Movements Muscles of Facial Expression: None, normal Lips and Perioral Area: None, normal Jaw: None, normal Tongue: None, normal,Extremity Movements Upper (arms, wrists, hands, fingers): None,  normal Lower (legs, knees, ankles, toes): None, normal, Trunk Movements Neck, shoulders, hips: None, normal, Overall Severity Severity of abnormal movements (  highest score from questions above): None, normal Incapacitation due to abnormal movements: None, normal Patient's awareness of abnormal movements (rate only patient's report): No Awareness, Dental Status Current problems with teeth and/or dentures?: No Does patient usually wear dentures?: No  CIWA:    COWS:     Musculoskeletal: Strength & Muscle Tone: within normal limits Gait & Station: normal Patient leans: N/A  Psychiatric Specialty Exam:  Presentation  General Appearance:  Disheveled  Eye Contact: Minimal  Speech: Pressured  Speech Volume: Normal  Handedness: Right   Mood and Affect  Mood: Euphoric; Irritable  Affect: Inappropriate   Thought Process  Thought Processes: Disorganized  Descriptions of Associations:Loose  Orientation:Partial  Thought Content:Illogical; Scattered  History of Schizophrenia/Schizoaffective disorder:No data recorded Duration of Psychotic Symptoms:No data recorded Hallucinations:No data recorded Ideas of Reference:Delusions; Paranoia  Suicidal Thoughts:No data recorded Homicidal Thoughts:No data recorded  Sensorium  Memory: Immediate Poor; Recent Poor; Remote Poor  Judgment: Poor  Insight: Poor   Executive Functions  Concentration: Fair  Attention Span: Fair  Recall: Poor  Fund of Knowledge: Poor  Language: Poor   Psychomotor Activity  Psychomotor Activity:No data recorded  Assets  Assets: Desire for Improvement; Social Support   Sleep  Sleep:No data recorded   Physical Exam: Physical Exam Vitals and nursing note reviewed.  Constitutional:      Appearance: Normal appearance.  HENT:     Head: Normocephalic and atraumatic.     Mouth/Throat:     Pharynx: Oropharynx is clear.  Eyes:     Pupils: Pupils are equal, round, and  reactive to light.  Cardiovascular:     Rate and Rhythm: Normal rate and regular rhythm.  Pulmonary:     Effort: Pulmonary effort is normal.     Breath sounds: Normal breath sounds.  Abdominal:     General: Abdomen is flat.     Palpations: Abdomen is soft.  Musculoskeletal:        General: Normal range of motion.  Skin:    General: Skin is warm and dry.  Neurological:     General: No focal deficit present.     Mental Status: He is alert. Mental status is at baseline.  Psychiatric:        Attention and Perception: He is inattentive.        Mood and Affect: Mood normal. Affect is blunt.        Speech: Speech normal.        Behavior: Behavior normal.        Thought Content: Thought content normal.        Cognition and Memory: Cognition is impaired.    Review of Systems  Constitutional: Negative.   HENT: Negative.    Eyes: Negative.   Respiratory: Negative.    Cardiovascular: Negative.   Gastrointestinal: Negative.   Musculoskeletal: Negative.   Skin: Negative.   Neurological: Negative.   Psychiatric/Behavioral: Negative.     Blood pressure 112/83, pulse 91, temperature 97.6 F (36.4 C), temperature source Oral, resp. rate 18, height 5\' 9"  (1.753 m), weight 96.4 kg, SpO2 98 %. Body mass index is 31.38 kg/m.   Treatment Plan Summary: Medication management and Plan no change to medication.  Supportive counseling encouraging him especially to let staff know if he is constipated.  Spoke with representative from [facility] and treatment team.  No change in the situation where we cannot get him a place to live yet so we are still waiting on placement.  Mordecai Rasmussen, MD 08/01/2022, 10:39 AM

## 2022-08-02 NOTE — Group Note (Signed)
Recreation Therapy Group Note   Group Topic:Leisure Education  Group Date: 08/02/2022 Start Time: 1000 End Time: 1100 Facilitators: Rosina Lowenstein, LRT, CTRS Location:  Craft Room  Group Description: Leisure. Patients were given the option to choose from drawing with oil pastels, playing apples to apples, making origami, journaling, or sing karaoke/listen to music duration of session. LRT and pts discussed the importance of participating in leisure during their free time and when they're outside of the hospital. Pt identified two leisure interests and shared with the group.   Goal Area(s) Addressed:  Patient will identify a current leisure interest.  Patient will practice making a positive decision. Patient will have the opportunity to try a new leisure activity.   Affect/Mood: N/A   Participation Level: Did not attend    Clinical Observations/Individualized Feedback: Kenzel did not attend group due to resting in his room.  Plan: Continue to engage patient in RT group sessions 2-3x/week.   Rosina Lowenstein, LRT, CTRS 08/02/2022 11:29 AM

## 2022-08-02 NOTE — Group Note (Signed)
[facility] LCSW Group Therapy Note   Group Date: 08/02/2022 Start Time: 1315 End Time: 1430  Type of Therapy and Topic:  Group Therapy:  Feelings around Relapse and Recovery  Participation Level:  Did Not Attend    Description of Group:    Patients in this group will discuss emotions they experience before and after a relapse. They will process how experiencing these feelings, or avoidance of experiencing them, relates to having a relapse. Facilitator will guide patients to explore emotions they have related to recovery. Patients will be encouraged to process which emotions are more powerful. They will be guided to discuss the emotional reaction significant others in their lives may have to patients' relapse or recovery. Patients will be assisted in exploring ways to respond to the emotions of others without this contributing to a relapse.  Therapeutic Goals: Patient will identify two or more emotions that lead to relapse for them:  Patient will identify two emotions that result when they relapse:  Patient will identify two emotions related to recovery:  Patient will demonstrate ability to communicate their needs through discussion and/or role plays.   Summary of Patient Progress: X   Therapeutic Modalities:   Cognitive Behavioral Therapy Solution-Focused Therapy Assertiveness Training Relapse Prevention Therapy   Travis Sandoval R Travis Lipinski, LCSW 

## 2022-08-02 NOTE — Progress Notes (Signed)
[facility] MD Progress Note  08/02/2022 2:48 PM Travis Sandoval  MRN:  [id number] Subjective: Patient seen.  No new complaints no changes to behavior Principal Problem: Undifferentiated schizophrenia Diagnosis: Principal Problem:   Undifferentiated schizophrenia  Total Time spent with patient: 30 minutes  Past Psychiatric History: Past history of schizophrenia  Past Medical History:  Past Medical History:  Diagnosis Date   ADHD    Bipolar 1 disorder (HCC)    Hepatitis C    Schizoaffective disorder (HCC)    History reviewed. No pertinent surgical history. Family History:  Family History  Family history unknown: Yes   Family Psychiatric  History: See previous Social History:  Social History   Substance and Sexual Activity  Alcohol Use Not Currently     Social History   Substance and Sexual Activity  Drug Use Not Currently    Social History   Socioeconomic History   Marital status: Unknown    Spouse name: Not on file   Number of children: Not on file   Years of education: Not on file   Highest education level: Not on file  Occupational History   Not on file  Tobacco Use   Smoking status: Every Day    Packs/day: 0.25    Years: 15.00    Additional pack years: 0.00    Total pack years: 3.75    Types: Cigarettes   Smokeless tobacco: Not on file  Vaping Use   Vaping Use: Not on file  Substance and Sexual Activity   Alcohol use: Not Currently   Drug use: Not Currently   Sexual activity: Not Currently  Other Topics Concern   Not on file  Social History Narrative   Not on file   Social Determinants of Health   Financial Resource Strain: Not on file  Food Insecurity: Patient Declined (05/18/2022)   Hunger Vital Sign    Worried About Running Out of Food in the Last Year: Patient declined    Ran Out of Food in the Last Year: Patient declined  Transportation Needs: Patient Declined (05/18/2022)   PRAPARE - Administrator, Civil Service (Medical): Patient  declined    Lack of Transportation (Non-Medical): Patient declined  Physical Activity: Not on file  Stress: Not on file  Social Connections: Not on file   Additional Social History:                         Sleep: Fair  Appetite:  Fair  Current Medications: Current Facility-Administered Medications  Medication Dose Route Frequency Provider Last Rate Last Admin   acetaminophen (TYLENOL) tablet 650 mg  650 mg Oral Q6H PRN Gabriel Cirri F, NP   650 mg at 08/02/22 0800   alum & mag hydroxide-simeth (MAALOX/MYLANTA) 200-200-20 MG/5ML suspension 30 mL  30 mL Oral Q4H PRN Gabriel Cirri F, NP   30 mL at 07/31/22 2105   ARIPiprazole ER (ABILIFY MAINTENA) injection 400 mg  400 mg Intramuscular Q28 days Ellana Kawa T, MD   400 mg at 07/25/22 1501   cloZAPine (CLOZARIL) tablet 350 mg  350 mg Oral QHS Teya Otterson T, MD   350 mg at 08/01/22 2106   docusate sodium (COLACE) capsule 200 mg  200 mg Oral BID Asia Favata T, MD   200 mg at 07/27/22 0939   famotidine (PEPCID) tablet 20 mg  20 mg Oral BID Katherine Tout T, MD   20 mg at 07/20/22 1740   hydrOXYzine (ATARAX) tablet 50  mg  50 mg Oral Q6H PRN Orbin Mayeux T, MD   50 mg at 07/20/22 2102   magnesium citrate solution 1 Bottle  1 Bottle Oral Daily PRN Jaynie Bream, RPH   1 Bottle at 07/19/22 1643   magnesium hydroxide (MILK OF MAGNESIA) suspension 30 mL  30 mL Oral Daily PRN Jaynie Bream, RPH   30 mL at 07/18/22 1718   mineral oil liquid   Oral Daily PRN Sarina Ill, DO   Given at 07/17/22 1201   nicotine polacrilex (NICORETTE) gum 2 mg  2 mg Oral PRN Deltha Bernales, [name], MD   2 mg at 04/23/22 2200   polyethylene glycol (MIRALAX / GLYCOLAX) packet 17 g  17 g Oral Daily Willetta York, [name], MD   17 g at 08/02/22 0800   senna-docusate (Senokot-S) tablet 2 tablet  2 tablet Oral Daily PRN Berenize Gatlin, [name], MD   2 tablet at 07/14/22 0809   ziprasidone (GEODON) injection 20 mg  20 mg Intramuscular Q12H PRN Reggie Pile,  MD        Lab Results: No results found for this or any previous visit (from the past 48 hour(s)).  Blood Alcohol level:  Lab Results  Component Value Date   ETH <10 04/15/2022    Metabolic Disorder Labs: Lab Results  Component Value Date   HGBA1C 5.5 04/24/2022   MPG 111 04/24/2022   No results found for: "PROLACTIN" Lab Results  Component Value Date   CHOL 220 (H) 04/24/2022   TRIG 700 (H) 04/24/2022   HDL 35 (L) 04/24/2022   CHOLHDL 6.3 04/24/2022   VLDL UNABLE TO CALCULATE IF TRIGLYCERIDE OVER 400 mg/dL 16/01/9603   LDLCALC UNABLE TO CALCULATE IF TRIGLYCERIDE OVER 400 mg/dL 54/12/8117    Physical Findings: AIMS: Facial and Oral Movements Muscles of Facial Expression: None, normal Lips and Perioral Area: None, normal Jaw: None, normal Tongue: None, normal,Extremity Movements Upper (arms, wrists, hands, fingers): None, normal Lower (legs, knees, ankles, toes): None, normal, Trunk Movements Neck, shoulders, hips: None, normal, Overall Severity Severity of abnormal movements (highest score from questions above): None, normal Incapacitation due to abnormal movements: None, normal Patient's awareness of abnormal movements (rate only patient's report): No Awareness, Dental Status Current problems with teeth and/or dentures?: No Does patient usually wear dentures?: No  CIWA:    COWS:     Musculoskeletal: Strength & Muscle Tone: within normal limits Gait & Station: normal Patient leans: N/A  Psychiatric Specialty Exam:  Presentation  General Appearance:  Disheveled  Eye Contact: Minimal  Speech: Pressured  Speech Volume: Normal  Handedness: Right   Mood and Affect  Mood: Euphoric; Irritable  Affect: Inappropriate   Thought Process  Thought Processes: Disorganized  Descriptions of Associations:Loose  Orientation:Partial  Thought Content:Illogical; Scattered  History of Schizophrenia/Schizoaffective disorder:No data recorded Duration of  Psychotic Symptoms:No data recorded Hallucinations:No data recorded Ideas of Reference:Delusions; Paranoia  Suicidal Thoughts:No data recorded Homicidal Thoughts:No data recorded  Sensorium  Memory: Immediate Poor; Recent Poor; Remote Poor  Judgment: Poor  Insight: Poor   Executive Functions  Concentration: Fair  Attention Span: Fair  Recall: Poor  Fund of Knowledge: Poor  Language: Poor   Psychomotor Activity  Psychomotor Activity:No data recorded  Assets  Assets: Desire for Improvement; Social Support   Sleep  Sleep:No data recorded   Physical Exam: Physical Exam Constitutional:      Appearance: Normal appearance.  HENT:     Head: Normocephalic and atraumatic.     Mouth/Throat:  Pharynx: Oropharynx is clear.  Eyes:     Pupils: Pupils are equal, round, and reactive to light.  Cardiovascular:     Rate and Rhythm: Normal rate and regular rhythm.  Pulmonary:     Effort: Pulmonary effort is normal.     Breath sounds: Normal breath sounds.  Abdominal:     General: Abdomen is flat.     Palpations: Abdomen is soft.  Musculoskeletal:        General: Normal range of motion.  Skin:    General: Skin is warm and dry.  Neurological:     General: No focal deficit present.     Mental Status: He is alert. Mental status is at baseline.  Psychiatric:        Attention and Perception: Attention normal.        Mood and Affect: Mood normal.        Speech: Speech normal.        Behavior: Behavior normal.        Thought Content: Thought content normal.        Cognition and Memory: Cognition normal.    Review of Systems  Constitutional: Negative.   HENT: Negative.    Eyes: Negative.   Respiratory: Negative.    Cardiovascular: Negative.   Gastrointestinal: Negative.   Musculoskeletal: Negative.   Skin: Negative.   Neurological: Negative.   Psychiatric/Behavioral: Negative.     Blood pressure 105/85, pulse (!) 114, temperature 98.1 F (36.7 C),  temperature source Oral, resp. rate 18, height 5\' 9"  (1.753 m), weight 96.4 kg, SpO2 97 %. Body mass index is 31.38 kg/m.   Treatment Plan Summary: Plan no change to medication no change to plan.  Waiting on discharge.  Mordecai Rasmussen, MD 08/02/2022, 2:48 PM

## 2022-08-02 NOTE — Plan of Care (Signed)
Patient alert and oriented. Patient presented in a pleasant mood on assessment stating that he slept "good" last night and had complaints of a headache, rating it a "4/10", in which he requested PRN pain medication to help with relief. Patient denied SI, HI, AVH, and pain at this time. Patient also denied any signs/symptoms of depression and anxiety, stating that overall, his mood is a "7 or 8/10". Patient had no stated goals for today.   A- Some of scheduled medications administered to patient, per MD orders. Patient only wants to take Miralax. Support and encouragement provided.  Routine safety checks conducted every 15 minutes. Patient informed to notify staff with problems or concerns.   R- No adverse drug reactions noted. Patient contracts for safety at this time. Patient compliant with some medications and treatment plan. Patient receptive, calm, and cooperative. Patient isolates to room, except for meals and medication. Patient remains safe at this time.  Problem: Education: Goal: Knowledge of General Education information will improve Description: Including pain rating scale, medication(s)/side effects and non-pharmacologic comfort measures Outcome: Progressing   Problem: Health Behavior/Discharge Planning: Goal: Ability to manage health-related needs will improve Outcome: Progressing   Problem: Clinical Measurements: Goal: Ability to maintain clinical measurements within normal limits will improve Outcome: Progressing Goal: Will remain free from infection Outcome: Progressing Goal: Diagnostic test results will improve Outcome: Progressing Goal: Respiratory complications will improve Outcome: Progressing Goal: Cardiovascular complication will be avoided Outcome: Progressing   Problem: Activity: Goal: Risk for activity intolerance will decrease Outcome: Progressing   Problem: Nutrition: Goal: Adequate nutrition will be maintained Outcome: Progressing   Problem:  Coping: Goal: Level of anxiety will decrease Outcome: Progressing   Problem: Elimination: Goal: Will not experience complications related to urinary retention Outcome: Progressing   Problem: Pain Managment: Goal: General experience of comfort will improve Outcome: Progressing   Problem: Safety: Goal: Ability to remain free from injury will improve Outcome: Progressing   Problem: Skin Integrity: Goal: Risk for impaired skin integrity will decrease Outcome: Progressing   Problem: Education: Goal: Knowledge of [place] [facility] Education information/materials will improve Outcome: Progressing Goal: Emotional status will improve Outcome: Progressing Goal: Mental status will improve Outcome: Progressing Goal: Verbalization of understanding the information provided will improve Outcome: Progressing   Problem: Activity: Goal: Interest or engagement in activities will improve Outcome: Progressing Goal: Sleeping patterns will improve Outcome: Progressing   Problem: Coping: Goal: Ability to verbalize frustrations and anger appropriately will improve Outcome: Progressing Goal: Ability to demonstrate self-control will improve Outcome: Progressing   Problem: Health Behavior/Discharge Planning: Goal: Identification of resources available to assist in meeting health care needs will improve Outcome: Progressing Goal: Compliance with treatment plan for underlying cause of condition will improve Outcome: Progressing   Problem: Physical Regulation: Goal: Ability to maintain clinical measurements within normal limits will improve Outcome: Progressing   Problem: Safety: Goal: Periods of time without injury will increase Outcome: Progressing   Problem: Activity: Goal: Will verbalize the importance of balancing activity with adequate rest periods Outcome: Progressing   Problem: Education: Goal: Will be free of psychotic symptoms Outcome: Progressing Goal: Knowledge of the  prescribed therapeutic regimen will improve Outcome: Progressing   Problem: Coping: Goal: Coping ability will improve Outcome: Progressing Goal: Will verbalize feelings Outcome: Progressing   Problem: Health Behavior/Discharge Planning: Goal: Compliance with prescribed medication regimen will improve Outcome: Progressing   Problem: Nutritional: Goal: Ability to achieve adequate nutritional intake will improve Outcome: Progressing   Problem: Role Relationship: Goal: Ability to communicate  needs accurately will improve Outcome: Progressing Goal: Ability to interact with others will improve Outcome: Progressing   Problem: Safety: Goal: Ability to redirect hostility and anger into socially appropriate behaviors will improve Outcome: Progressing Goal: Ability to remain free from injury will improve Outcome: Progressing   Problem: Self-Care: Goal: Ability to participate in self-care as condition permits will improve Outcome: Progressing   Problem: Self-Concept: Goal: Will verbalize positive feelings about self Outcome: Progressing

## 2022-08-02 NOTE — Progress Notes (Signed)
Patient continues to refuse Pepcid and Colace. MD is well aware, as patient has been refusing these medications for a while now.

## 2022-08-03 DIAGNOSIS — F203 Undifferentiated schizophrenia: Secondary | ICD-10-CM | POA: Diagnosis not present

## 2022-08-03 NOTE — Plan of Care (Signed)
D: Patient alert and oriented. Patient denies pain. Patient denies anxiety and depression. Patient denies SI/HI/AVH. Patient remains isolative to room during shift with exception to coming out for meals.  A: Q15 minute safety checks maintained.   R: No adverse drug reactions noted. Patient remains safe on the unit at this time. Problem: Education: Goal: Knowledge of [place] [facility] Education information/materials will improve Outcome: Progressing Goal: Verbalization of understanding the information provided will improve Outcome: Progressing   Problem: Physical Regulation: Goal: Ability to maintain clinical measurements within normal limits will improve Outcome: Progressing   Problem: Safety: Goal: Periods of time without injury will increase Outcome: Progressing

## 2022-08-03 NOTE — Progress Notes (Signed)
[facility] MD Progress Note  08/03/2022 1:37 PM Travis Sandoval  MRN:  [id number] Subjective: Follow-up 42 year old man with schizophrenia who stays withdrawn and in his room but is eating well and taking care of his hygiene adequately enough.  No new complaints today.  No behavior problems Principal Problem: Undifferentiated schizophrenia Diagnosis: Principal Problem:   Undifferentiated schizophrenia  Total Time spent with patient: 30 minutes  Past Psychiatric History: Past history of schizophrenia  Past Medical History:  Past Medical History:  Diagnosis Date   ADHD    Bipolar 1 disorder (HCC)    Hepatitis C    Schizoaffective disorder (HCC)    History reviewed. No pertinent surgical history. Family History:  Family History  Family history unknown: Yes   Family Psychiatric  History: See previous Social History:  Social History   Substance and Sexual Activity  Alcohol Use Not Currently     Social History   Substance and Sexual Activity  Drug Use Not Currently    Social History   Socioeconomic History   Marital status: Unknown    Spouse name: Not on file   Number of children: Not on file   Years of education: Not on file   Highest education level: Not on file  Occupational History   Not on file  Tobacco Use   Smoking status: Every Day    Packs/day: 0.25    Years: 15.00    Additional pack years: 0.00    Total pack years: 3.75    Types: Cigarettes   Smokeless tobacco: Not on file  Vaping Use   Vaping Use: Not on file  Substance and Sexual Activity   Alcohol use: Not Currently   Drug use: Not Currently   Sexual activity: Not Currently  Other Topics Concern   Not on file  Social History Narrative   Not on file   Social Determinants of Health   Financial Resource Strain: Not on file  Food Insecurity: Patient Declined (05/18/2022)   Hunger Vital Sign    Worried About Running Out of Food in the Last Year: Patient declined    Ran Out of Food in the Last Year:  Patient declined  Transportation Needs: Patient Declined (05/18/2022)   PRAPARE - Administrator, Civil Service (Medical): Patient declined    Lack of Transportation (Non-Medical): Patient declined  Physical Activity: Not on file  Stress: Not on file  Social Connections: Not on file   Additional Social History:                         Sleep: Fair  Appetite:  Fair  Current Medications: Current Facility-Administered Medications  Medication Dose Route Frequency Provider Last Rate Last Admin   acetaminophen (TYLENOL) tablet 650 mg  650 mg Oral Q6H PRN Gabriel Cirri F, NP   650 mg at 08/02/22 0800   alum & mag hydroxide-simeth (MAALOX/MYLANTA) 200-200-20 MG/5ML suspension 30 mL  30 mL Oral Q4H PRN Gabriel Cirri F, NP   30 mL at 08/02/22 2118   ARIPiprazole ER (ABILIFY MAINTENA) injection 400 mg  400 mg Intramuscular Q28 days Zoey Gilkeson T, MD   400 mg at 07/25/22 1501   cloZAPine (CLOZARIL) tablet 350 mg  350 mg Oral QHS Deone Leifheit T, MD   350 mg at 08/02/22 2114   docusate sodium (COLACE) capsule 200 mg  200 mg Oral BID Leonte Horrigan T, MD   200 mg at 07/27/22 0939   famotidine (PEPCID) tablet 20  mg  20 mg Oral BID Telsa Dillavou T, MD   20 mg at 07/20/22 1740   hydrOXYzine (ATARAX) tablet 50 mg  50 mg Oral Q6H PRN Sherhonda Gaspar T, MD   50 mg at 07/20/22 2102   magnesium citrate solution 1 Bottle  1 Bottle Oral Daily PRN Jaynie Bream, RPH   1 Bottle at 07/19/22 1643   magnesium hydroxide (MILK OF MAGNESIA) suspension 30 mL  30 mL Oral Daily PRN Jaynie Bream, RPH   30 mL at 07/18/22 1718   mineral oil liquid   Oral Daily PRN Sarina Ill, DO   Given at 07/17/22 1201   nicotine polacrilex (NICORETTE) gum 2 mg  2 mg Oral PRN Yasuko Lapage, [name], MD   2 mg at 04/23/22 2200   polyethylene glycol (MIRALAX / GLYCOLAX) packet 17 g  17 g Oral Daily Nkechi Linehan, [name], MD   17 g at 08/03/22 6811   senna-docusate (Senokot-S) tablet 2 tablet  2 tablet Oral  Daily PRN Brionna Romanek, [name], MD   2 tablet at 07/14/22 0809   ziprasidone (GEODON) injection 20 mg  20 mg Intramuscular Q12H PRN Reggie Pile, MD        Lab Results: No results found for this or any previous visit (from the past 48 hour(s)).  Blood Alcohol level:  Lab Results  Component Value Date   ETH <10 04/15/2022    Metabolic Disorder Labs: Lab Results  Component Value Date   HGBA1C 5.5 04/24/2022   MPG 111 04/24/2022   No results found for: "PROLACTIN" Lab Results  Component Value Date   CHOL 220 (H) 04/24/2022   TRIG 700 (H) 04/24/2022   HDL 35 (L) 04/24/2022   CHOLHDL 6.3 04/24/2022   VLDL UNABLE TO CALCULATE IF TRIGLYCERIDE OVER 400 mg/dL 57/26/2035   LDLCALC UNABLE TO CALCULATE IF TRIGLYCERIDE OVER 400 mg/dL 59/74/1638    Physical Findings: AIMS: Facial and Oral Movements Muscles of Facial Expression: None, normal Lips and Perioral Area: None, normal Jaw: None, normal Tongue: None, normal,Extremity Movements Upper (arms, wrists, hands, fingers): None, normal Lower (legs, knees, ankles, toes): None, normal, Trunk Movements Neck, shoulders, hips: None, normal, Overall Severity Severity of abnormal movements (highest score from questions above): None, normal Incapacitation due to abnormal movements: None, normal Patient's awareness of abnormal movements (rate only patient's report): No Awareness, Dental Status Current problems with teeth and/or dentures?: No Does patient usually wear dentures?: No  CIWA:    COWS:     Musculoskeletal: Strength & Muscle Tone: within normal limits Gait & Station: normal Patient leans: N/A  Psychiatric Specialty Exam:  Presentation  General Appearance:  Disheveled  Eye Contact: Minimal  Speech: Pressured  Speech Volume: Normal  Handedness: Right   Mood and Affect  Mood: Euphoric; Irritable  Affect: Inappropriate   Thought Process  Thought Processes: Disorganized  Descriptions of  Associations:Loose  Orientation:Partial  Thought Content:Illogical; Scattered  History of Schizophrenia/Schizoaffective disorder:No data recorded Duration of Psychotic Symptoms:No data recorded Hallucinations:No data recorded Ideas of Reference:Delusions; Paranoia  Suicidal Thoughts:No data recorded Homicidal Thoughts:No data recorded  Sensorium  Memory: Immediate Poor; Recent Poor; Remote Poor  Judgment: Poor  Insight: Poor   Executive Functions  Concentration: Fair  Attention Span: Fair  Recall: Poor  Fund of Knowledge: Poor  Language: Poor   Psychomotor Activity  Psychomotor Activity:No data recorded  Assets  Assets: Desire for Improvement; Social Support   Sleep  Sleep:No data recorded   Physical Exam: Physical Exam Vitals  reviewed.  Constitutional:      Appearance: Normal appearance.  HENT:     Head: Normocephalic and atraumatic.     Mouth/Throat:     Pharynx: Oropharynx is clear.  Eyes:     Pupils: Pupils are equal, round, and reactive to light.  Cardiovascular:     Rate and Rhythm: Normal rate and regular rhythm.  Pulmonary:     Effort: Pulmonary effort is normal.     Breath sounds: Normal breath sounds.  Abdominal:     General: Abdomen is flat.     Palpations: Abdomen is soft.  Musculoskeletal:        General: Normal range of motion.  Skin:    General: Skin is warm and dry.  Neurological:     General: No focal deficit present.     Mental Status: He is alert. Mental status is at baseline.  Psychiatric:        Attention and Perception: He is inattentive.        Mood and Affect: Mood normal. Affect is blunt.        Speech: Speech is delayed.        Thought Content: Thought content normal.    Review of Systems  Constitutional: Negative.   HENT: Negative.    Eyes: Negative.   Respiratory: Negative.    Cardiovascular: Negative.   Gastrointestinal: Negative.   Musculoskeletal: Negative.   Skin: Negative.   Neurological:  Negative.   Psychiatric/Behavioral: Negative.     Blood pressure 129/87, pulse (!) 113, temperature 98.1 F (36.7 C), temperature source Oral, resp. rate 18, height  (1.753 m), weight 96.4 kg, SpO2 98 %. Body mass index is 31.38 kg/m.   Treatment Plan Summary: Daily contact with patient to assess and evaluate symptoms and progress in treatment, Medication management, and Plan continue daily contact no change to medication.  Encourage patient.  Check in regularly to see if his needs are being met.  Still focused on discharge.  Mordecai Rasmussen, MD 08/03/2022, 1:37 PM

## 2022-08-03 NOTE — Plan of Care (Signed)
Alert and oriented. Visible in the dayroom. Limited interaction with staff and peers. Snacks offered and accepted. PO medications given as scheduled. Tol well. Showered. Q 15 min checks maintained for safety. Denies SI/HI/AVH. No behavior issues noted. No c/o pain/discomfort noted. Plan of care continued.   Problem: Physical Regulation: Goal: Ability to maintain clinical measurements within normal limits will improve Outcome: Progressing   Problem: Safety: Goal: Periods of time without injury will increase Outcome: Progressing   Problem: Education: Goal: Will be free of psychotic symptoms Outcome: Progressing   Problem: Physical Regulation: Goal: Ability to maintain clinical measurements within normal limits will improve Outcome: Progressing   Problem: Coping: Goal: Coping ability will improve Outcome: Progressing

## 2022-08-03 NOTE — Group Note (Signed)
LCSW Group Therapy Note   Group Date: 08/03/2022 Start Time: 1400 End Time: 1510   Type of Therapy and Topic:  Group Therapy: AA/NA  Participation Level:  Did Not Attend  Summary of Patient Progress:    Patient did not attend group.  Marshell Levan, LCSWA 08/03/2022  3:18 PM

## 2022-08-04 DIAGNOSIS — F203 Undifferentiated schizophrenia: Secondary | ICD-10-CM | POA: Diagnosis not present

## 2022-08-04 MED ORDER — CLOZAPINE 100 MG PO TABS
300.0000 mg | ORAL_TABLET | Freq: Every day | ORAL | Status: DC
  Administered 2022-08-04 – 2022-09-24 (×50): 300 mg via ORAL
  Filled 2022-08-04 (×3): qty 3
  Filled 2022-08-04: qty 12
  Filled 2022-08-04 (×47): qty 3

## 2022-08-04 NOTE — Plan of Care (Signed)
  Problem: Activity: Goal: Risk for activity intolerance will decrease Outcome: Progressing   Problem: Nutrition: Goal: Adequate nutrition will be maintained Outcome: Progressing   Problem: Coping: Goal: Level of anxiety will decrease Outcome: Progressing   Problem: Elimination: Goal: Will not experience complications related to urinary retention Outcome: Progressing   Problem: Safety: Goal: Ability to remain free from injury will improve Outcome: Progressing   

## 2022-08-04 NOTE — Progress Notes (Signed)
[facility] MD Progress Note  08/04/2022 12:11 PM Travis Sandoval  MRN:  [id number] Subjective: Follow-up 42 year old man with schizophrenia.  No new complaints.  Stays in his room most of the time.  Not over sedated though.  No dangerous behavior. 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 No pertinent surgical history. Family History:  Family History  Family history unknown: Yes   Family Psychiatric  History: See previous Social History:  Social History   Substance and Sexual Activity  Alcohol Use Not Currently     Social History   Substance and Sexual Activity  Drug Use Not Currently    Social History   Socioeconomic History   Marital status: Unknown    Spouse name: Not on file   Number of children: Not on file   Years of education: Not on file   Highest education level: Not on file  Occupational History   Not on file  Tobacco Use   Smoking status: Every Day    Packs/day: 0.25    Years: 15.00    Additional pack years: 0.00    Total pack years: 3.75    Types: Cigarettes   Smokeless tobacco: Not on file  Vaping Use   Vaping Use: Not on file  Substance and Sexual Activity   Alcohol use: Not Currently   Drug use: Not Currently   Sexual activity: Not Currently  Other Topics Concern   Not on file  Social History Narrative   Not on file   Social Determinants of Health   Financial Resource Strain: Not on file  Food Insecurity: Patient Declined (05/18/2022)   Hunger Vital Sign    Worried About Running Out of Food in the Last Year: Patient declined    Ran Out of Food in the Last Year: Patient declined  Transportation Needs: Patient  Declined (05/18/2022)   PRAPARE - Administrator, Civil Service (Medical): Patient declined    Lack of Transportation (Non-Medical): Patient declined  Physical Activity: Not on file  Stress: Not on file  Social Connections: Not on file   Additional Social History:                         Sleep: Fair  Appetite:  Fair  Current Medications: Current Facility-Administered Medications  Medication Dose Route Frequency Provider Last Rate Last Admin   acetaminophen (TYLENOL) tablet 650 mg  650 mg Oral Q6H PRN Gabriel Cirri F, NP   650 mg at 08/02/22 0800   alum & mag hydroxide-simeth (MAALOX/MYLANTA) 200-200-20 MG/5ML suspension 30 mL  30 mL Oral Q4H PRN Gabriel Cirri F, NP   30 mL at 08/03/22 2113   ARIPiprazole ER (ABILIFY MAINTENA) injection 400 mg  400 mg Intramuscular Q28 days Erik Burkett T, MD   400 mg at 07/25/22 1501   cloZAPine (CLOZARIL) tablet 350 mg  350 mg Oral QHS Geraldine Sandberg T, MD   350 mg at 08/03/22 2112   docusate sodium (COLACE) capsule 200 mg  200 mg Oral BID Kemiah Booz T, MD   200 mg at 07/27/22 0939   famotidine (PEPCID) tablet 20 mg  20 mg Oral BID  Mearle Drew, [name], MD   20 mg at 07/20/22 1740   hydrOXYzine (ATARAX) tablet 50 mg  50 mg Oral Q6H PRN Margerite Impastato T, MD   50 mg at 07/20/22 2102   magnesium citrate solution 1 Bottle  1 Bottle Oral Daily PRN Jaynie Bream, RPH   1 Bottle at 07/19/22 1643   magnesium hydroxide (MILK OF MAGNESIA) suspension 30 mL  30 mL Oral Daily PRN Jaynie Bream, RPH   30 mL at 07/18/22 1718   mineral oil liquid   Oral Daily PRN Sarina Ill, DO   Given at 07/17/22 1201   nicotine polacrilex (NICORETTE) gum 2 mg  2 mg Oral PRN Keonta Alsip, [name], MD   2 mg at 04/23/22 2200   polyethylene glycol (MIRALAX / GLYCOLAX) packet 17 g  17 g Oral Daily Heidi Lemay, [name], MD   17 g at 08/04/22 0850   senna-docusate (Senokot-S) tablet 2 tablet  2 tablet Oral Daily PRN Blimy Napoleon, [name], MD   2 tablet at  07/14/22 0809   ziprasidone (GEODON) injection 20 mg  20 mg Intramuscular Q12H PRN Reggie Pile, MD        Lab Results: No results found for this or any previous visit (from the past 48 hour(s)).  Blood Alcohol level:  Lab Results  Component Value Date   ETH <10 04/15/2022    Metabolic Disorder Labs: Lab Results  Component Value Date   HGBA1C 5.5 04/24/2022   MPG 111 04/24/2022   No results found for: "PROLACTIN" Lab Results  Component Value Date   CHOL 220 (H) 04/24/2022   TRIG 700 (H) 04/24/2022   HDL 35 (L) 04/24/2022   CHOLHDL 6.3 04/24/2022   VLDL UNABLE TO CALCULATE IF TRIGLYCERIDE OVER 400 mg/dL 63/78/5885   LDLCALC UNABLE TO CALCULATE IF TRIGLYCERIDE OVER 400 mg/dL 02/77/4128    Physical Findings: AIMS: Facial and Oral Movements Muscles of Facial Expression: None, normal Lips and Perioral Area: None, normal Jaw: None, normal Tongue: None, normal,Extremity Movements Upper (arms, wrists, hands, fingers): None, normal Lower (legs, knees, ankles, toes): None, normal, Trunk Movements Neck, shoulders, hips: None, normal, Overall Severity Severity of abnormal movements (highest score from questions above): None, normal Incapacitation due to abnormal movements: None, normal Patient's awareness of abnormal movements (rate only patient's report): No Awareness, Dental Status Current problems with teeth and/or dentures?: No Does patient usually wear dentures?: No  CIWA:    COWS:     Musculoskeletal: Strength & Muscle Tone: within normal limits Gait & Station: normal Patient leans: N/A  Psychiatric Specialty Exam:  Presentation  General Appearance:  Disheveled  Eye Contact: Minimal  Speech: Pressured  Speech Volume: Normal  Handedness: Right   Mood and Affect  Mood: Euphoric; Irritable  Affect: Inappropriate   Thought Process  Thought Processes: Disorganized  Descriptions of Associations:Loose  Orientation:Partial  Thought  Content:Illogical; Scattered  History of Schizophrenia/Schizoaffective disorder:No data recorded Duration of Psychotic Symptoms:No data recorded Hallucinations:No data recorded Ideas of Reference:Delusions; Paranoia  Suicidal Thoughts:No data recorded Homicidal Thoughts:No data recorded  Sensorium  Memory: Immediate Poor; Recent Poor; Remote Poor  Judgment: Poor  Insight: Poor   Executive Functions  Concentration: Fair  Attention Span: Fair  Recall: Poor  Fund of Knowledge: Poor  Language: Poor   Psychomotor Activity  Psychomotor Activity:No data recorded  Assets  Assets: Desire for Improvement; Social Support   Sleep  Sleep:No data recorded   Physical Exam: Physical Exam Vitals and nursing note reviewed.  Constitutional:  Appearance: Normal appearance.  HENT:     Head: Normocephalic and atraumatic.     Mouth/Throat:     Pharynx: Oropharynx is clear.  Eyes:     Pupils: Pupils are equal, round, and reactive to light.  Cardiovascular:     Rate and Rhythm: Normal rate and regular rhythm.  Pulmonary:     Effort: Pulmonary effort is normal.     Breath sounds: Normal breath sounds.  Abdominal:     General: Abdomen is flat.     Palpations: Abdomen is soft.  Musculoskeletal:        General: Normal range of motion.  Skin:    General: Skin is warm and dry.  Neurological:     General: No focal deficit present.     Mental Status: He is alert. Mental status is at baseline.  Psychiatric:        Attention and Perception: Attention normal.        Mood and Affect: Mood normal. Affect is blunt.        Speech: Speech normal.        Behavior: Behavior is cooperative.        Thought Content: Thought content is delusional.        Cognition and Memory: Cognition normal.    Review of Systems  Constitutional: Negative.   HENT: Negative.    Eyes: Negative.   Respiratory: Negative.    Cardiovascular: Negative.   Gastrointestinal: Negative.    Musculoskeletal: Negative.   Skin: Negative.   Neurological: Negative.   Psychiatric/Behavioral: Negative.     Blood pressure 116/74, pulse 92, temperature 97.7 F (36.5 C), temperature source Oral, resp. rate 18, height  (1.753 m), weight 96.4 kg, SpO2 98 %. Body mass index is 31.38 kg/m.   Treatment Plan Summary: Medication management and Plan stable and primarily just here waiting for placement.  Went back and reviewed the labs.  The follow-up Clozapine level was still over 900 although not as high as the 1 before that.  Given that it did not seem like there was much clinical change from 1 dosage to another am going to cut the dose back to 300 mg at night.  Mordecai Rasmussen, MD 08/04/2022, 12:11 PM

## 2022-08-04 NOTE — Plan of Care (Addendum)
Pt is calm and cooperative. States he's ok and requested shower. Visible in dayroom. Limited interaction with peers and staff. Po med compliant. C/o heartburn. Maalox given PRN for relief. Support and encouragement provided. Q 15 min checks maintained for safety. No behavior issues noted. Denies SI/HI/AVH. No c/o pain/discomfort noted. Plan of care provided.   Problem: Clinical Measurements: Goal: Ability to maintain clinical measurements within normal limits will improve Outcome: Progressing   Problem: Clinical Measurements: Goal: Will remain free from infection Outcome: Progressing   Problem: Clinical Measurements: Goal: Diagnostic test results will improve Outcome: Progressing   Problem: Clinical Measurements: Goal: Respiratory complications will improve Outcome: Progressing   Problem: Clinical Measurements: Goal: Cardiovascular complication will be avoided Outcome: Progressing

## 2022-08-04 NOTE — Plan of Care (Signed)
  Problem: Education: Goal: Knowledge of General Education information will improve Description: Including pain rating scale, medication(s)/side effects and non-pharmacologic comfort measures Outcome: Progressing   Problem: Health Behavior/Discharge Planning: Goal: Ability to manage health-related needs will improve Outcome: Progressing   Problem: Clinical Measurements: Goal: Ability to maintain clinical measurements within normal limits will improve Outcome: Progressing Goal: Will remain free from infection Outcome: Progressing Goal: Diagnostic test results will improve Outcome: Progressing Goal: Respiratory complications will improve Outcome: Progressing Goal: Cardiovascular complication will be avoided Outcome: Progressing   Problem: Activity: Goal: Risk for activity intolerance will decrease Outcome: Progressing   Problem: Nutrition: Goal: Adequate nutrition will be maintained Outcome: Progressing   Problem: Coping: Goal: Level of anxiety will decrease Outcome: Progressing   Problem: Elimination: Goal: Will not experience complications related to urinary retention Outcome: Progressing   Problem: Pain Managment: Goal: General experience of comfort will improve Outcome: Progressing   Problem: Safety: Goal: Ability to remain free from injury will improve Outcome: Progressing   Problem: Skin Integrity: Goal: Risk for impaired skin integrity will decrease Outcome: Progressing   Problem: Education: Goal: Knowledge of [facility] Education information/materials will improve Outcome: Progressing Goal: Emotional status will improve Outcome: Progressing Goal: Mental status will improve Outcome: Progressing Goal: Verbalization of understanding the information provided will improve Outcome: Progressing   Problem: Activity: Goal: Interest or engagement in activities will improve Outcome: Progressing Goal: Sleeping patterns will improve Outcome:  Progressing   Problem: Coping: Goal: Ability to verbalize frustrations and anger appropriately will improve Outcome: Progressing Goal: Ability to demonstrate self-control will improve Outcome: Progressing   Problem: Health Behavior/Discharge Planning: Goal: Identification of resources available to assist in meeting health care needs will improve Outcome: Progressing Goal: Compliance with treatment plan for underlying cause of condition will improve Outcome: Progressing   Problem: Physical Regulation: Goal: Ability to maintain clinical measurements within normal limits will improve Outcome: Progressing   Problem: Safety: Goal: Periods of time without injury will increase Outcome: Progressing   Problem: Activity: Goal: Will verbalize the importance of balancing activity with adequate rest periods Outcome: Progressing   Problem: Education: Goal: Will be free of psychotic symptoms Outcome: Progressing Goal: Knowledge of the prescribed therapeutic regimen will improve Outcome: Progressing   Problem: Coping: Goal: Coping ability will improve Outcome: Progressing Goal: Will verbalize feelings Outcome: Progressing   Problem: Health Behavior/Discharge Planning: Goal: Compliance with prescribed medication regimen will improve Outcome: Progressing   Problem: Nutritional: Goal: Ability to achieve adequate nutritional intake will improve Outcome: Progressing   Problem: Role Relationship: Goal: Ability to communicate needs accurately will improve Outcome: Progressing Goal: Ability to interact with others will improve Outcome: Progressing   Problem: Safety: Goal: Ability to redirect hostility and anger into socially appropriate behaviors will improve Outcome: Progressing Goal: Ability to remain free from injury will improve Outcome: Progressing   Problem: Self-Care: Goal: Ability to participate in self-care as condition permits will improve Outcome: Progressing    Problem: Self-Concept: Goal: Will verbalize positive feelings about self Outcome: Progressing   

## 2022-08-04 NOTE — Progress Notes (Signed)
D- Patient alert and oriented x 2-3. Affect flat/mood preoccupied. Denies SI/ HI,/AVH. He denies pain. He states that he is "hoping to discharge soon". A- Scheduled medications administered to patient, per MD orders, except for Pepcid and Colace. Support and encouragement provided.  Routine safety checks conducted every 15 minutes without incident. Patient informed to notify staff with problems or concerns and verbalizes understanding. R- No adverse drug reactions noted. Patient compliant with other medications and treatment plan. Patient receptive, calm, and cooperative. He is isolative to room except for meals, however he participated in outdoor activity this afternoon. He contracts for safety and remains safe on the unit at this time.

## 2022-08-04 NOTE — Group Note (Signed)
[facility] LCSW Group Therapy Note   Group Date: 08/04/2022 Start Time: 1330 End Time: 1400  Type of Therapy/Topic:  Group Therapy:  Feelings about Diagnosis  Participation Level:  Did Not Attend      Description of Group:    This group will allow patients to explore their thoughts and feelings about diagnoses they have received. Patients will be guided to explore their level of understanding and acceptance of these diagnoses. Facilitator will encourage patients to process their thoughts and feelings about the reactions of others to their diagnosis, and will guide patients in identifying ways to discuss their diagnosis with significant others in their lives. This group will be process-oriented, with patients participating in exploration of their own experiences as well as giving and receiving support and challenge from other group members.   Therapeutic Goals: 1. Patient will demonstrate understanding of diagnosis as evidence by identifying two or more symptoms of the disorder:  2. Patient will be able to express two feelings regarding the diagnosis 3. Patient will demonstrate ability to communicate their needs through discussion and/or role plays  Summary of Patient Progress:  X        Therapeutic Modalities:   Cognitive Behavioral Therapy Brief Therapy Feelings Identification    Jerred Zaremba S Alyssandra Hulsebus, LCSW 

## 2022-08-05 DIAGNOSIS — F203 Undifferentiated schizophrenia: Secondary | ICD-10-CM | POA: Diagnosis not present

## 2022-08-05 NOTE — BH IP Treatment Plan (Signed)
Interdisciplinary Treatment and Diagnostic Plan Update  08/05/2022 Time of Session: 8:30 AM Travis Sandoval MRN: [id number]  Principal Diagnosis: Undifferentiated schizophrenia  Secondary Diagnoses: Principal Problem:   Undifferentiated schizophrenia   Current Medications:  Current Facility-Administered Medications  Medication Dose Route Frequency Provider Last Rate Last Admin   acetaminophen (TYLENOL) tablet 650 mg  650 mg Oral Q6H PRN Gabriel Cirri F, NP   650 mg at 08/02/22 0800   alum & mag hydroxide-simeth (MAALOX/MYLANTA) 200-200-20 MG/5ML suspension 30 mL  30 mL Oral Q4H PRN Gabriel Cirri F, NP   30 mL at 08/04/22 2129   ARIPiprazole ER (ABILIFY MAINTENA) injection 400 mg  400 mg Intramuscular Q28 days [name], [name], MD   400 mg at 07/25/22 1501   cloZAPine (CLOZARIL) tablet 300 mg  300 mg Oral QHS [name], [name], MD   300 mg at 08/04/22 2128   docusate sodium (COLACE) capsule 200 mg  200 mg Oral BID [name], [name], MD   200 mg at 07/27/22 0939   famotidine (PEPCID) tablet 20 mg  20 mg Oral BID [name], [name], MD   20 mg at 07/20/22 1740   hydrOXYzine (ATARAX) tablet 50 mg  50 mg Oral Q6H PRN [name], [name], MD   50 mg at 07/20/22 2102   magnesium citrate solution 1 Bottle  1 Bottle Oral Daily PRN Jaynie Bream, RPH   1 Bottle at 07/19/22 1643   magnesium hydroxide (MILK OF MAGNESIA) suspension 30 mL  30 mL Oral Daily PRN Jaynie Bream, RPH   30 mL at 07/18/22 1718   mineral oil liquid   Oral Daily PRN Sarina Ill, DO   Given at 07/17/22 1201   nicotine polacrilex (NICORETTE) gum 2 mg  2 mg Oral PRN [name], [name], MD   2 mg at 04/23/22 2200   polyethylene glycol (MIRALAX / GLYCOLAX) packet 17 g  17 g Oral Daily [name], [name], MD   17 g at 08/05/22 9741   senna-docusate (Senokot-S) tablet 2 tablet  2 tablet Oral Daily PRN [name], [name], MD   2 tablet at 07/14/22 0809   ziprasidone (GEODON) injection 20 mg  20 mg Intramuscular Q12H PRN Reggie Pile, MD       PTA Medications: Medications Prior to Admission  Medication Sig Dispense Refill Last Dose   ABILIFY MAINTENA 400 MG SRER injection Inject 400 mg into the muscle every 28 (twenty-eight) days.      ARIPiprazole (ABILIFY) 10 MG tablet Take 10 mg by mouth daily. (Patient not taking: Reported on 04/16/2022)      atomoxetine (STRATTERA) 40 MG capsule Take 40 mg by mouth daily. (Patient not taking: Reported on 04/16/2022)      atorvastatin (LIPITOR) 40 MG tablet Take 40 mg by mouth daily.      budesonide-formoterol (SYMBICORT) 160-4.5 MCG/ACT inhaler Inhale 2 puffs into the lungs.      cetirizine (ZYRTEC) 10 MG tablet Take 10 mg by mouth daily.      clozapine (CLOZARIL) 200 MG tablet Take 200 mg by mouth 2 (two) times daily. Take along with one 25 mg tablet for total 225 mg twice daily      cloZAPine (CLOZARIL) 25 MG tablet Take 25 mg by mouth 2 (two) times daily. Take along with one 200 mg tablet for total 225 mg twice daily      Dexlansoprazole (DEXILANT) 30 MG capsule Take 30 mg by mouth daily. (Patient not taking: Reported on 04/16/2022)  hydrOXYzine (VISTARIL) 50 MG capsule Take 50 mg by mouth 3 (three) times daily as needed for itching. (Patient not taking: Reported on 04/16/2022)      Melatonin 5 MG TABS Take 5 mg by mouth at bedtime as needed (sleep).      metoprolol succinate (TOPROL-XL) 25 MG 24 hr tablet Take 12.5 mg by mouth daily.      Phosphatidyl Choline 65 MG TABS Take 1 tablet by mouth daily.  (Patient not taking: Reported on 04/16/2022)      valproic acid (DEPAKENE) 250 MG/5ML solution Take 750-1,000 mLs by mouth 2 (two) times daily. 1000 mg (20 mL) every morning and 750 mg (15 mL) daily at bedtime       Patient Stressors: Other: Psychosis    Patient Strengths: Ability for insight  Active sense of humor  Average or above average intelligence  Capable of independent living  Supportive family/friends   Treatment Modalities: Medication Management, Group  therapy, Case management,  1 to 1 session with clinician, Psychoeducation, Recreational therapy.   Physician Treatment Plan for Primary Diagnosis: Undifferentiated schizophrenia Long Term Goal(s):     Short Term Goals: None, pt is a poor hx.  Medication Management: Evaluate patient's response, side effects, and tolerance of medication regimen.  Therapeutic Interventions: 1 to 1 sessions, Unit Group sessions and Medication administration.  Evaluation of Outcomes: Adequate for Discharge  Physician Treatment Plan for Secondary Diagnosis: Principal Problem:   Undifferentiated schizophrenia  Long Term Goal(s):     Short Term Goals: None, pt is a poor hx.     Medication Management: Evaluate patient's response, side effects, and tolerance of medication regimen.  Therapeutic Interventions: 1 to 1 sessions, Unit Group sessions and Medication administration.  Evaluation of Outcomes: Adequate for Discharge   RN Treatment Plan for Primary Diagnosis: Undifferentiated schizophrenia Long Term Goal(s): Knowledge of disease and therapeutic regimen to maintain health will improve  Short Term Goals: Ability to demonstrate self-control, Ability to participate in decision making will improve, Ability to verbalize feelings will improve, Ability to disclose and discuss suicidal ideas, Ability to identify and develop effective coping behaviors will improve, and Compliance with prescribed medications will improve  Medication Management: RN will administer medications as ordered by provider, will assess and evaluate patient's response and provide education to patient for prescribed medication. RN will report any adverse and/or side effects to prescribing provider.  Therapeutic Interventions: 1 on 1 counseling sessions, Psychoeducation, Medication administration, Evaluate responses to treatment, Monitor vital signs and CBGs as ordered, Perform/monitor CIWA, COWS, AIMS and Fall Risk screenings as ordered,  Perform wound care treatments as ordered.  Evaluation of Outcomes: Adequate for Discharge   LCSW Treatment Plan for Primary Diagnosis: Undifferentiated schizophrenia Long Term Goal(s): Safe transition to appropriate next level of care at discharge, Engage patient in therapeutic group addressing interpersonal concerns.  Short Term Goals: Engage patient in aftercare planning with referrals and resources, Increase social support, Increase ability to appropriately verbalize feelings, Increase emotional regulation, Facilitate acceptance of mental health diagnosis and concerns, and Increase skills for wellness and recovery  Therapeutic Interventions: Assess for all discharge needs, 1 to 1 time with Social worker, Explore available resources and support systems, Assess for adequacy in community support network, Educate family and significant other(s) on suicide prevention, Complete Psychosocial Assessment, Interpersonal group therapy.  Evaluation of Outcomes: Adequate for Discharge   Progress in Treatment: Attending groups: No. Participating in groups: No. Taking medication as prescribed: Yes. Toleration medication: Yes. Family/Significant other contact made: Yes, individual(s)  contacted:  SPE completed with patient and guardian. Patient understands diagnosis: No. Discussing patient identified problems/goals with staff: Yes. Medical problems stabilized or resolved: Yes. Denies suicidal/homicidal ideation: Yes. Issues/concerns per patient self-inventory: No. Other: none  New problem(s) identified: No, Describe:  none Update 06/30/2021:  No changes at this time. Update 07/06/2022:  No changes at this time. Update 07/11/22: No changes at this time. Update 07/16/22: none. Update 07/21/22: none. Update 07/26/22: No changes at this time.  Update 07/31/2022:  No changes at this time.  Update 08/05/2022: No change at this time.    New Short Term/Long Term Goal(s): elimination of symptoms of psychosis,  medication management for mood stabilization; elimination of SI thoughts; development of comprehensive mental wellness plan. Update 06/30/2021:  No changes at this time. Update 07/06/2022:  No changes at this time. Update 07/11/22: No changes at this time. Update 07/16/22: Patient to work towards elimination of symptoms of psychosis, medication management for mood stabilization; development of comprehensive mental wellness plan. Update 07/21/22: Patient to work towards elimination of symptoms of psychosis, medication management for mood stabilization; development of comprehensive mental wellness plan. Update 07/26/22: No changes at this time.  Update 07/31/2022:  No changes at this time.  Update 08/05/2022: No change at this time.     Patient Goals:  No additional treatment goals identified by patient. Update 06/30/2021:  No changes at this time. Update 07/06/2022:  No changes at this time. Update 07/11/22: No changes at this time. Update 07/16/22: No additional goals identified at this time. Patient to continue to work towards original goals identified in initial treatment team meeting. CSW will remain available to patient should they voice additional treatment goals. Update 07/21/22: No additional goals identified at this time. Patient to continue to work towards original goals identified in initial treatment team meeting. CSW will remain available to patient should they voice additional treatment goals. Update 07/26/22: No changes at this time.  Update 07/31/2022:  No changes at this time. Update 08/05/2022: No change at this time.    Discharge Plan or Barriers: Patient remains on the unit as placement continues to be sought. Funding remains the concern at this time and biggest barrier to placement at this time. Update 06/30/2021:  No changes at this time. Update 07/06/2022:  No changes at this time. Update 07/11/22: No changes at this time. Update 07/16/22: Patient lacks funding for placement, [employer] rep continues to communicate  with social security to restart payment. Patient has guardianship hearing on this day. Situation ongoing, CSW will continue to monitor and update note as more information becomes available. Update 07/21/22: Patient lacks adequate funding, CSW to continue pursing payor source to secure placement. Patient is unable to living independently per psychiatrist. Update 07/26/22: No changes at this time. Update 07/31/2022:  No changes at this time.  Update 08/05/2022: No change at this time.    Reason for Continuation of Hospitalization: Anxiety Delusions  Depression Hallucinations   Estimated Length of Stay: TBD Update 07/06/2022:  TBD Update 07/11/22: No changes at this time. Update 07/16/22: TBD. Update 07/21/22: TBD. Update 07/26/22: No changes at this time.  Update 07/31/2022:  No changes at this time.  Update 08/05/2022: No change at this time.   Last 3 [place] Suicide Severity Risk Score: Flowsheet Row Admission (Current) from [facility]/16/2022 in [facility] INPAT[facility] MEDICINE ED from 04/15/2022 in [facility] Emergency Department at [facility]  C-SSRS RISK CATEGORY No Risk No Risk       Last PHQ 2/9 Scores:  No data to display          Scribe for Treatment Team: Harden Mo, Alexander Mt 08/05/2022 9:24 AM

## 2022-08-05 NOTE — Group Note (Signed)
Date:  08/05/2022 Time:  10:07 PM  Group Topic/Focus:  Wrap-Up Group:   The focus of this group is to help patients review their daily goal of treatment and discuss progress on daily workbooks.    Participation Level:  None  Participation Quality:  Appropriate and Attentive  Affect:  Appropriate and Flat  Cognitive:  Alert and Appropriate  Insight: Appropriate  Engagement in Group:  Improving  Modes of Intervention:  Limit-setting  Additional Comments:     Maglione,Jolie Strohecker E 08/05/2022, 10:07 PM

## 2022-08-05 NOTE — Plan of Care (Signed)
  Problem: Activity: Goal: Risk for activity intolerance will decrease Outcome: Progressing   Problem: Nutrition: Goal: Adequate nutrition will be maintained Outcome: Progressing   Problem: Coping: Goal: Level of anxiety will decrease Outcome: Progressing Pt isolative to his room encouraged to come to the [facility]. Compliant with medications. Support and encouragement ongoing.

## 2022-08-05 NOTE — Progress Notes (Signed)
Patient continues to refuse Pepcid and Colace. MD is well aware, as patient has been refusing for the past couple of weeks now.

## 2022-08-05 NOTE — Group Note (Signed)
Recreation Therapy Group Note   Group Topic:Problem Solving  Group Date: 08/05/2022 Start Time: 1000 End Time: 1055 Facilitators: Rosina Lowenstein, LRT, CTRS Location:  Craft Room  Group Description: Life Boat. Patients were given the scenario that they are on a boat that is about to become shipwrecked, leaving them stranded on an [place]. They are asked to make a list of 15 different items that they want to take with them when they are stranded on the [place]. Patients are asked to rank their items from most important to least important, #1 being the most important and #15 being the least. Patients will work individually for the first round to come up with 15 items and then pair up with a peer(s) to condense their list and come up with one list of 15 items between the two of them. Patients or LRT will read aloud the 15 different items to the group after each round. LRT facilitated post-activity processing to discuss how this activity can be used in daily life post discharge.   Goal Area(s) Addressed:  Patient will identify priorities, wants and needs. Patient will communicate with LRT and peers. Patient will work collectively as a Administrator, Civil Service. Patient will work on Product manager.   Affect/Mood: N/A   Participation Level: Did not attend    Clinical Observations/Individualized Feedback: Travis Sandoval did not attend group due to resting in his room.  Plan: Continue to engage patient in RT group sessions 2-3x/week.   Rosina Lowenstein, LRT, CTRS 08/05/2022 11:13 AM

## 2022-08-05 NOTE — Group Note (Signed)
[facility] LCSW Group Therapy Note    Group Date: 08/05/2022 Start Time: 1330 End Time: 1415  Type of Therapy and Topic:  Group Therapy:  Overcoming Obstacles  Participation Level:  [facility] PARTICIPATION LEVEL: Did Not Attend   Description of Group:   In this group patients will be encouraged to explore what they see as obstacles to their own wellness and recovery. They will be guided to discuss their thoughts, feelings, and behaviors related to these obstacles. The group will process together ways to cope with barriers, with attention given to specific choices patients can make. Each patient will be challenged to identify changes they are motivated to make in order to overcome their obstacles. This group will be process-oriented, with patients participating in exploration of their own experiences as well as giving and receiving support and challenge from other group members.  Therapeutic Goals: 1. Patient will identify personal and current obstacles as they relate to admission. 2. Patient will identify barriers that currently interfere with their wellness or overcoming obstacles.  3. Patient will identify feelings, thought process and behaviors related to these barriers. 4. Patient will identify two changes they are willing to make to overcome these obstacles:    Summary of Patient Progress X   Therapeutic Modalities:   Cognitive Behavioral Therapy Solution Focused Therapy Motivational Interviewing Relapse Prevention Therapy   Fisher Hargadon R Tatumn Corbridge, LCSW 

## 2022-08-05 NOTE — Plan of Care (Signed)
Patient alert and oriented. Patient presented in a pleasant mood on assessment stating that he slept "good" last night and had no complaints to voice to this Clinical research associate. Patient denied SI, HI, AVH, and pain at this time. Patient also denied any signs/symptoms of depression and anxiety, stating that overall he is feeling "good". Patient had no stated goals for today.   A- Some of scheduled medications administered to patient, per MD orders. Patient only wants to take Miralax. Support and encouragement provided.  Routine safety checks conducted every 15 minutes. Patient informed to notify staff with problems or concerns.   R- No adverse drug reactions noted. Patient contracts for safety at this time. Patient compliant with some medications and treatment plan. Patient receptive, calm, and cooperative. Patient isolates to room, except for meals and medication. Patient remains safe at this time  Problem: Education: Goal: Knowledge of General Education information will improve Description: Including pain rating scale, medication(s)/side effects and non-pharmacologic comfort measures Outcome: Progressing   Problem: Health Behavior/Discharge Planning: Goal: Ability to manage health-related needs will improve Outcome: Progressing   Problem: Clinical Measurements: Goal: Ability to maintain clinical measurements within normal limits will improve Outcome: Progressing Goal: Will remain free from infection Outcome: Progressing Goal: Diagnostic test results will improve Outcome: Progressing Goal: Respiratory complications will improve Outcome: Progressing Goal: Cardiovascular complication will be avoided Outcome: Progressing   Problem: Activity: Goal: Risk for activity intolerance will decrease Outcome: Progressing   Problem: Nutrition: Goal: Adequate nutrition will be maintained Outcome: Progressing   Problem: Coping: Goal: Level of anxiety will decrease Outcome: Progressing   Problem:  Elimination: Goal: Will not experience complications related to urinary retention Outcome: Progressing   Problem: Pain Managment: Goal: General experience of comfort will improve Outcome: Progressing   Problem: Safety: Goal: Ability to remain free from injury will improve Outcome: Progressing   Problem: Skin Integrity: Goal: Risk for impaired skin integrity will decrease Outcome: Progressing   Problem: Education: Goal: Knowledge of  General Education information/materials will improve Outcome: Progressing Goal: Emotional status will improve Outcome: Progressing Goal: Mental status will improve Outcome: Progressing Goal: Verbalization of understanding the information provided will improve Outcome: Progressing   Problem: Activity: Goal: Interest or engagement in activities will improve Outcome: Progressing Goal: Sleeping patterns will improve Outcome: Progressing   Problem: Coping: Goal: Ability to verbalize frustrations and anger appropriately will improve Outcome: Progressing Goal: Ability to demonstrate self-control will improve Outcome: Progressing   Problem: Health Behavior/Discharge Planning: Goal: Identification of resources available to assist in meeting health care needs will improve Outcome: Progressing Goal: Compliance with treatment plan for underlying cause of condition will improve Outcome: Progressing   Problem: Physical Regulation: Goal: Ability to maintain clinical measurements within normal limits will improve Outcome: Progressing   Problem: Safety: Goal: Periods of time without injury will increase Outcome: Progressing   Problem: Activity: Goal: Will verbalize the importance of balancing activity with adequate rest periods Outcome: Progressing   Problem: Education: Goal: Will be free of psychotic symptoms Outcome: Progressing Goal: Knowledge of the prescribed therapeutic regimen will improve Outcome: Progressing   Problem:  Coping: Goal: Coping ability will improve Outcome: Progressing Goal: Will verbalize feelings Outcome: Progressing   Problem: Health Behavior/Discharge Planning: Goal: Compliance with prescribed medication regimen will improve Outcome: Progressing   Problem: Nutritional: Goal: Ability to achieve adequate nutritional intake will improve Outcome: Progressing   Problem: Role Relationship: Goal: Ability to communicate needs accurately will improve Outcome: Progressing Goal: Ability to interact with others will improve Outcome:  Progressing   Problem: Safety: Goal: Ability to redirect hostility and anger into socially appropriate behaviors will improve Outcome: Progressing Goal: Ability to remain free from injury will improve Outcome: Progressing   Problem: Self-Care: Goal: Ability to participate in self-care as condition permits will improve Outcome: Progressing   Problem: Self-Concept: Goal: Will verbalize positive feelings about self Outcome: Progressing

## 2022-08-05 NOTE — Group Note (Signed)
Date:  08/05/2022 Time:  6:28 PM  Group Topic/Focus:  Activity Group    Participation Level:  Active  Participation Quality:  Appropriate  Affect:  Appropriate  Cognitive:  Appropriate  Insight: Appropriate  Engagement in Group:  Engaged  Modes of Intervention:  Activity  Additional Comments:    Wilford Corner 08/05/2022, 6:28 PM

## 2022-08-05 NOTE — Progress Notes (Signed)
[facility] MD Progress Note  08/05/2022 1:42 PM Travis Sandoval  MRN:  [id number] Subjective: Follow-up 42 year old man with schizophrenia.  No new complaints no change to presentation Principal Problem: Undifferentiated schizophrenia Diagnosis: Principal Problem:   Undifferentiated schizophrenia  Total Time spent with patient: 30 minutes  Past Psychiatric History: See previous  Past Medical History:  Past Medical History:  Diagnosis Date   ADHD    Bipolar 1 disorder (HCC)    Hepatitis C    Schizoaffective disorder (HCC)    History reviewed. No pertinent surgical history. Family History:  Family History  Family history unknown: Yes   Family Psychiatric  History: See previous Social History:  Social History   Substance and Sexual Activity  Alcohol Use Not Currently     Social History   Substance and Sexual Activity  Drug Use Not Currently    Social History   Socioeconomic History   Marital status: Unknown    Spouse name: Not on file   Number of children: Not on file   Years of education: Not on file   Highest education level: Not on file  Occupational History   Not on file  Tobacco Use   Smoking status: Every Day    Packs/day: 0.25    Years: 15.00    Additional pack years: 0.00    Total pack years: 3.75    Types: Cigarettes   Smokeless tobacco: Not on file  Vaping Use   Vaping Use: Not on file  Substance and Sexual Activity   Alcohol use: Not Currently   Drug use: Not Currently   Sexual activity: Not Currently  Other Topics Concern   Not on file  Social History Narrative   Not on file   Social Determinants of Health   Financial Resource Strain: Not on file  Food Insecurity: Patient Declined (05/18/2022)   Hunger Vital Sign    Worried About Running Out of Food in the Last Year: Patient declined    Ran Out of Food in the Last Year: Patient declined  Transportation Needs: Patient Declined (05/18/2022)   PRAPARE - Administrator, Civil Service  (Medical): Patient declined    Lack of Transportation (Non-Medical): Patient declined  Physical Activity: Not on file  Stress: Not on file  Social Connections: Not on file   Additional Social History:                         Sleep: Fair  Appetite:  Fair  Current Medications: Current Facility-Administered Medications  Medication Dose Route Frequency Provider Last Rate Last Admin   acetaminophen (TYLENOL) tablet 650 mg  650 mg Oral Q6H PRN Gabriel Cirri F, NP   650 mg at 08/02/22 0800   alum & mag hydroxide-simeth (MAALOX/MYLANTA) 200-200-20 MG/5ML suspension 30 mL  30 mL Oral Q4H PRN Gabriel Cirri F, NP   30 mL at 08/04/22 2129   ARIPiprazole ER (ABILIFY MAINTENA) injection 400 mg  400 mg Intramuscular Q28 days Gor Vestal T, MD   400 mg at 07/25/22 1501   cloZAPine (CLOZARIL) tablet 300 mg  300 mg Oral QHS Verdelle Valtierra T, MD   300 mg at 08/04/22 2128   docusate sodium (COLACE) capsule 200 mg  200 mg Oral BID Caly Pellum T, MD   200 mg at 07/27/22 0939   famotidine (PEPCID) tablet 20 mg  20 mg Oral BID Aleece Loyd, [name], MD   20 mg at 07/20/22 1740   hydrOXYzine (ATARAX) tablet  50 mg  50 mg Oral Q6H PRN Chick Cousins T, MD   50 mg at 07/20/22 2102   magnesium citrate solution 1 Bottle  1 Bottle Oral Daily PRN Jaynie Bream, RPH   1 Bottle at 07/19/22 1643   magnesium hydroxide (MILK OF MAGNESIA) suspension 30 mL  30 mL Oral Daily PRN Jaynie Bream, RPH   30 mL at 07/18/22 1718   mineral oil liquid   Oral Daily PRN Sarina Ill, DO   Given at 07/17/22 1201   nicotine polacrilex (NICORETTE) gum 2 mg  2 mg Oral PRN Timmie Calix, [name], MD   2 mg at 04/23/22 2200   polyethylene glycol (MIRALAX / GLYCOLAX) packet 17 g  17 g Oral Daily Katiejo Gilroy, [name], MD   17 g at 08/05/22 1610   senna-docusate (Senokot-S) tablet 2 tablet  2 tablet Oral Daily PRN Quanesha Klimaszewski, [name], MD   2 tablet at 07/14/22 0809   ziprasidone (GEODON) injection 20 mg  20 mg Intramuscular Q12H  PRN Reggie Pile, MD        Lab Results: No results found for this or any previous visit (from the past 48 hour(s)).  Blood Alcohol level:  Lab Results  Component Value Date   ETH <10 04/15/2022    Metabolic Disorder Labs: Lab Results  Component Value Date   HGBA1C 5.5 04/24/2022   MPG 111 04/24/2022   No results found for: "PROLACTIN" Lab Results  Component Value Date   CHOL 220 (H) 04/24/2022   TRIG 700 (H) 04/24/2022   HDL 35 (L) 04/24/2022   CHOLHDL 6.3 04/24/2022   VLDL UNABLE TO CALCULATE IF TRIGLYCERIDE OVER 400 mg/dL 96/07/5407   LDLCALC UNABLE TO CALCULATE IF TRIGLYCERIDE OVER 400 mg/dL 81/19/1478    Physical Findings: AIMS: Facial and Oral Movements Muscles of Facial Expression: None, normal Lips and Perioral Area: None, normal Jaw: None, normal Tongue: None, normal,Extremity Movements Upper (arms, wrists, hands, fingers): None, normal Lower (legs, knees, ankles, toes): None, normal, Trunk Movements Neck, shoulders, hips: None, normal, Overall Severity Severity of abnormal movements (highest score from questions above): None, normal Incapacitation due to abnormal movements: None, normal Patient's awareness of abnormal movements (rate only patient's report): No Awareness, Dental Status Current problems with teeth and/or dentures?: No Does patient usually wear dentures?: No  CIWA:    COWS:     Musculoskeletal: Strength & Muscle Tone: within normal limits Gait & Station: normal Patient leans: N/A  Psychiatric Specialty Exam:  Presentation  General Appearance:  Disheveled  Eye Contact: Minimal  Speech: Pressured  Speech Volume: Normal  Handedness: Right   Mood and Affect  Mood: Euphoric; Irritable  Affect: Inappropriate   Thought Process  Thought Processes: Disorganized  Descriptions of Associations:Loose  Orientation:Partial  Thought Content:Illogical; Scattered  History of Schizophrenia/Schizoaffective disorder:No data  recorded Duration of Psychotic Symptoms:No data recorded Hallucinations:No data recorded Ideas of Reference:Delusions; Paranoia  Suicidal Thoughts:No data recorded Homicidal Thoughts:No data recorded  Sensorium  Memory: Immediate Poor; Recent Poor; Remote Poor  Judgment: Poor  Insight: Poor   Executive Functions  Concentration: Fair  Attention Span: Fair  Recall: Poor  Fund of Knowledge: Poor  Language: Poor   Psychomotor Activity  Psychomotor Activity:No data recorded  Assets  Assets: Desire for Improvement; Social Support   Sleep  Sleep:No data recorded   Physical Exam: Physical Exam Vitals and nursing note reviewed.  Constitutional:      Appearance: Normal appearance.  HENT:     Head: Normocephalic  and atraumatic.     Mouth/Throat:     Pharynx: Oropharynx is clear.  Eyes:     Pupils: Pupils are equal, round, and reactive to light.  Cardiovascular:     Rate and Rhythm: Normal rate and regular rhythm.  Pulmonary:     Effort: Pulmonary effort is normal.     Breath sounds: Normal breath sounds.  Abdominal:     General: Abdomen is flat.     Palpations: Abdomen is soft.  Musculoskeletal:        General: Normal range of motion.  Skin:    General: Skin is warm and dry.  Neurological:     General: No focal deficit present.     Mental Status: He is alert. Mental status is at baseline.  Psychiatric:        Attention and Perception: He is inattentive.        Mood and Affect: Mood normal. Affect is blunt.        Speech: Speech is delayed.        Behavior: Behavior is withdrawn.        Thought Content: Thought content normal.    Review of Systems  Constitutional: Negative.   HENT: Negative.    Eyes: Negative.   Respiratory: Negative.    Cardiovascular: Negative.   Gastrointestinal: Negative.   Musculoskeletal: Negative.   Skin: Negative.   Neurological: Negative.   Psychiatric/Behavioral: Negative.     Blood pressure 114/80, pulse 96,  temperature 97.7 F (36.5 C), temperature source Oral, resp. rate 17, height  (1.753 m), weight 96.4 kg, SpO2 98 %. Body mass index is 31.38 kg/m.   Treatment Plan Summary: Plan tolerating medication.  Tolerated the decrease in clozapine.  No change in treatment plan for now primarily waiting on discharge  Mordecai Rasmussen, MD 08/05/2022, 1:42 PM

## 2022-08-06 DIAGNOSIS — F203 Undifferentiated schizophrenia: Secondary | ICD-10-CM | POA: Diagnosis not present

## 2022-08-06 NOTE — Group Note (Signed)
Date:  08/06/2022 Time:  6:30 PM  Group Topic/Focus:  Activity Group    Participation Level:  Active  Participation Quality:  Appropriate  Affect:  Appropriate  Cognitive:  Appropriate  Insight: Appropriate  Engagement in Group:  Engaged  Modes of Intervention:  Activity  Additional Comments:    Kirstine Jacquin Travis Delara Shepheard 08/06/2022, 6:30 PM  

## 2022-08-06 NOTE — Group Note (Signed)
Date:  08/06/2022 Time:  6:49 PM  Group Topic/Focus:  Goals Group:   The focus of this group is to help patients establish daily goals to achieve during treatment and discuss how the patient can incorporate goal setting into their daily lives to aide in recovery.  Community Group    Participation Level:  Did Not Attend   Doug Sou 08/06/2022, 6:49 PM

## 2022-08-06 NOTE — Progress Notes (Signed)
[facility] MD Progress Note  08/06/2022 3:55 PM Travis Sandoval  MRN:  [id number] Subjective: Follow-up patient with schizophrenia.  No new complaints no changes to behavior Principal Problem: Undifferentiated schizophrenia Diagnosis: Principal Problem:   Undifferentiated schizophrenia  Total Time spent with patient: 30 minutes  Past Psychiatric History: History of schizophrenia  Past Medical History:  Past Medical History:  Diagnosis Date   ADHD    Bipolar 1 disorder (HCC)    Hepatitis C    Schizoaffective disorder (HCC)    History reviewed. No pertinent surgical history. Family History:  Family History  Family history unknown: Yes   Family Psychiatric  History: See previous Social History:  Social History   Substance and Sexual Activity  Alcohol Use Not Currently     Social History   Substance and Sexual Activity  Drug Use Not Currently    Social History   Socioeconomic History   Marital status: Unknown    Spouse name: Not on file   Number of children: Not on file   Years of education: Not on file   Highest education level: Not on file  Occupational History   Not on file  Tobacco Use   Smoking status: Every Day    Packs/day: 0.25    Years: 15.00    Additional pack years: 0.00    Total pack years: 3.75    Types: Cigarettes   Smokeless tobacco: Not on file  Vaping Use   Vaping Use: Not on file  Substance and Sexual Activity   Alcohol use: Not Currently   Drug use: Not Currently   Sexual activity: Not Currently  Other Topics Concern   Not on file  Social History Narrative   Not on file   Social Determinants of Health   Financial Resource Strain: Not on file  Food Insecurity: Patient Declined (05/18/2022)   Hunger Vital Sign    Worried About Running Out of Food in the Last Year: Patient declined    Ran Out of Food in the Last Year: Patient declined  Transportation Needs: Patient Declined (05/18/2022)   PRAPARE - Administrator, Civil Service  (Medical): Patient declined    Lack of Transportation (Non-Medical): Patient declined  Physical Activity: Not on file  Stress: Not on file  Social Connections: Not on file   Additional Social History:                         Sleep: Fair  Appetite:  Fair  Current Medications: Current Facility-Administered Medications  Medication Dose Route Frequency Provider Last Rate Last Admin   acetaminophen (TYLENOL) tablet 650 mg  650 mg Oral Q6H PRN Gabriel Cirri F, NP   650 mg at 08/02/22 0800   alum & mag hydroxide-simeth (MAALOX/MYLANTA) 200-200-20 MG/5ML suspension 30 mL  30 mL Oral Q4H PRN Gabriel Cirri F, NP   30 mL at 08/05/22 2123   ARIPiprazole ER (ABILIFY MAINTENA) injection 400 mg  400 mg Intramuscular Q28 days Antanette Richwine T, MD   400 mg at 07/25/22 1501   cloZAPine (CLOZARIL) tablet 300 mg  300 mg Oral QHS Aryelle Figg T, MD   300 mg at 08/05/22 2123   docusate sodium (COLACE) capsule 200 mg  200 mg Oral BID Caelin Rosen T, MD   200 mg at 07/27/22 0939   famotidine (PEPCID) tablet 20 mg  20 mg Oral BID Klay Sobotka, [name], MD   20 mg at 07/20/22 1740   hydrOXYzine (ATARAX) tablet  50 mg  50 mg Oral Q6H PRN Homero Hyson T, MD   50 mg at 07/20/22 2102   magnesium citrate solution 1 Bottle  1 Bottle Oral Daily PRN Jaynie Bream, RPH   1 Bottle at 07/19/22 1643   magnesium hydroxide (MILK OF MAGNESIA) suspension 30 mL  30 mL Oral Daily PRN Jaynie Bream, RPH   30 mL at 07/18/22 1718   mineral oil liquid   Oral Daily PRN Sarina Ill, DO   Given at 07/17/22 1201   nicotine polacrilex (NICORETTE) gum 2 mg  2 mg Oral PRN Kensey Luepke, [name], MD   2 mg at 04/23/22 2200   polyethylene glycol (MIRALAX / GLYCOLAX) packet 17 g  17 g Oral Daily Addilynne Olheiser, [name], MD   17 g at 08/06/22 1610   senna-docusate (Senokot-S) tablet 2 tablet  2 tablet Oral Daily PRN Ericson Nafziger, [name], MD   2 tablet at 07/14/22 0809   ziprasidone (GEODON) injection 20 mg  20 mg Intramuscular Q12H  PRN Reggie Pile, MD        Lab Results: No results found for this or any previous visit (from the past 48 hour(s)).  Blood Alcohol level:  Lab Results  Component Value Date   ETH <10 04/15/2022    Metabolic Disorder Labs: Lab Results  Component Value Date   HGBA1C 5.5 04/24/2022   MPG 111 04/24/2022   No results found for: "PROLACTIN" Lab Results  Component Value Date   CHOL 220 (H) 04/24/2022   TRIG 700 (H) 04/24/2022   HDL 35 (L) 04/24/2022   CHOLHDL 6.3 04/24/2022   VLDL UNABLE TO CALCULATE IF TRIGLYCERIDE OVER 400 mg/dL 96/07/5407   LDLCALC UNABLE TO CALCULATE IF TRIGLYCERIDE OVER 400 mg/dL 81/19/1478    Physical Findings: AIMS: Facial and Oral Movements Muscles of Facial Expression: None, normal Lips and Perioral Area: None, normal Jaw: None, normal Tongue: None, normal,Extremity Movements Upper (arms, wrists, hands, fingers): None, normal Lower (legs, knees, ankles, toes): None, normal, Trunk Movements Neck, shoulders, hips: None, normal, Overall Severity Severity of abnormal movements (highest score from questions above): None, normal Incapacitation due to abnormal movements: None, normal Patient's awareness of abnormal movements (rate only patient's report): No Awareness, Dental Status Current problems with teeth and/or dentures?: No Does patient usually wear dentures?: No  CIWA:    COWS:     Musculoskeletal: Strength & Muscle Tone: within normal limits Gait & Station: normal Patient leans: N/A  Psychiatric Specialty Exam:  Presentation  General Appearance:  Disheveled  Eye Contact: Minimal  Speech: Pressured  Speech Volume: Normal  Handedness: Right   Mood and Affect  Mood: Euphoric; Irritable  Affect: Inappropriate   Thought Process  Thought Processes: Disorganized  Descriptions of Associations:Loose  Orientation:Partial  Thought Content:Illogical; Scattered  History of Schizophrenia/Schizoaffective disorder:No data  recorded Duration of Psychotic Symptoms:No data recorded Hallucinations:No data recorded Ideas of Reference:Delusions; Paranoia  Suicidal Thoughts:No data recorded Homicidal Thoughts:No data recorded  Sensorium  Memory: Immediate Poor; Recent Poor; Remote Poor  Judgment: Poor  Insight: Poor   Executive Functions  Concentration: Fair  Attention Span: Fair  Recall: Poor  Fund of Knowledge: Poor  Language: Poor   Psychomotor Activity  Psychomotor Activity:No data recorded  Assets  Assets: Desire for Improvement; Social Support   Sleep  Sleep:No data recorded   Physical Exam: Physical Exam Vitals and nursing note reviewed.  Constitutional:      Appearance: Normal appearance.  HENT:     Head: Normocephalic  and atraumatic.     Mouth/Throat:     Pharynx: Oropharynx is clear.  Eyes:     Pupils: Pupils are equal, round, and reactive to light.  Cardiovascular:     Rate and Rhythm: Normal rate and regular rhythm.  Pulmonary:     Effort: Pulmonary effort is normal.     Breath sounds: Normal breath sounds.  Abdominal:     General: Abdomen is flat.     Palpations: Abdomen is soft.  Musculoskeletal:        General: Normal range of motion.  Skin:    General: Skin is warm and dry.  Neurological:     General: No focal deficit present.     Mental Status: He is alert. Mental status is at baseline.  Psychiatric:        Mood and Affect: Mood normal.        Thought Content: Thought content normal.    Review of Systems  Constitutional: Negative.   HENT: Negative.    Eyes: Negative.   Respiratory: Negative.    Cardiovascular: Negative.   Gastrointestinal: Negative.   Musculoskeletal: Negative.   Skin: Negative.   Neurological: Negative.   Psychiatric/Behavioral: Negative.     Blood pressure 114/74, pulse 98, temperature 97.8 F (36.6 C), temperature source Oral, resp. rate (!) 21, height  (1.753 m), weight 96.4 kg, SpO2 98 %. Body mass index is  31.38 kg/m.   Treatment Plan Summary: Plan no change to medicine.  Nothing new to the whole situation.  Waiting for placement.  Mordecai Rasmussen, MD 08/06/2022, 3:55 PM

## 2022-08-06 NOTE — Group Note (Signed)
[facility] LCSW Group Therapy Note   Group Date: 08/06/2022 Start Time: 1315 End Time: 1420   Type of Therapy/Topic:  Group Therapy:  Emotion Regulation  Participation Level:  Did Not Attend    Description of Group:    The purpose of this group is to assist patients in learning to regulate negative emotions and experience positive emotions. Patients will be guided to discuss ways in which they have been vulnerable to their negative emotions. These vulnerabilities will be juxtaposed with experiences of positive emotions or situations, and patients challenged to use positive emotions to combat negative ones. Special emphasis will be placed on coping with negative emotions in conflict situations, and patients will process healthy conflict resolution skills.  Therapeutic Goals: Patient will identify two positive emotions or experiences to reflect on in order to balance out negative emotions:  Patient will label two or more emotions that they find the most difficult to experience:  Patient will be able to demonstrate positive conflict resolution skills through discussion or role plays:   Summary of Patient Progress: X   Therapeutic Modalities:   Cognitive Behavioral Therapy Feelings Identification Dialectical Behavioral Therapy   [name] R Ozan Maclay, LCSW 

## 2022-08-06 NOTE — Plan of Care (Signed)
  Problem: Education: Goal: Knowledge of General Education information will improve Description: Including pain rating scale, medication(s)/side effects and non-pharmacologic comfort measures Outcome: Progressing   Problem: Health Behavior/Discharge Planning: Goal: Ability to manage health-related needs will improve Outcome: Progressing   Problem: Clinical Measurements: Goal: Ability to maintain clinical measurements within normal limits will improve Outcome: Progressing Goal: Will remain free from infection Outcome: Progressing Goal: Diagnostic test results will improve Outcome: Progressing Goal: Respiratory complications will improve Outcome: Progressing Goal: Cardiovascular complication will be avoided Outcome: Progressing   Problem: Activity: Goal: Risk for activity intolerance will decrease Outcome: Progressing   Problem: Nutrition: Goal: Adequate nutrition will be maintained Outcome: Progressing   Problem: Coping: Goal: Level of anxiety will decrease Outcome: Progressing   Problem: Elimination: Goal: Will not experience complications related to urinary retention Outcome: Progressing   Problem: Pain Managment: Goal: General experience of comfort will improve Outcome: Progressing   Problem: Safety: Goal: Ability to remain free from injury will improve Outcome: Progressing   Problem: Education: Goal: Mental status will improve Outcome: Progressing Goal: Verbalization of understanding the information provided will improve Outcome: Progressing   Problem: Activity: Goal: Interest or engagement in activities will improve Outcome: Progressing Goal: Sleeping patterns will improve Outcome: Progressing   Problem: Coping: Goal: Ability to verbalize frustrations and anger appropriately will improve Outcome: Progressing Goal: Ability to demonstrate self-control will improve Outcome: Progressing   Problem: Health Behavior/Discharge Planning: Goal: Identification  of resources available to assist in meeting health care needs will improve Outcome: Progressing Goal: Compliance with treatment plan for underlying cause of condition will improve Outcome: Progressing   Problem: Physical Regulation: Goal: Ability to maintain clinical measurements within normal limits will improve Outcome: Progressing   Problem: Safety: Goal: Periods of time without injury will increase Outcome: Progressing   Problem: Activity: Goal: Will verbalize the importance of balancing activity with adequate rest periods Outcome: Progressing   Problem: Education: Goal: Will be free of psychotic symptoms Outcome: Progressing Goal: Knowledge of the prescribed therapeutic regimen will improve Outcome: Progressing   Problem: Coping: Goal: Coping ability will improve Outcome: Progressing Goal: Will verbalize feelings Outcome: Progressing   Problem: Health Behavior/Discharge Planning: Goal: Compliance with prescribed medication regimen will improve Outcome: Progressing   Problem: Nutritional: Goal: Ability to achieve adequate nutritional intake will improve Outcome: Progressing   Problem: Role Relationship: Goal: Ability to communicate needs accurately will improve Outcome: Progressing Goal: Ability to interact with others will improve Outcome: Progressing   Problem: Safety: Goal: Ability to redirect hostility and anger into socially appropriate behaviors will improve Outcome: Progressing Goal: Ability to remain free from injury will improve Outcome: Progressing   Problem: Self-Care: Goal: Ability to participate in self-care as condition permits will improve Outcome: Progressing   Problem: Self-Concept: Goal: Will verbalize positive feelings about self Outcome: Progressing

## 2022-08-06 NOTE — Progress Notes (Signed)
   08/05/22 2100  Psych Admission Type (Psych Patients Only)  Admission Status Voluntary  Psychosocial Assessment  Patient Complaints None  Eye Contact Watchful  Facial Expression Flat  Affect Appropriate to circumstance  Speech Logical/coherent  Interaction Assertive;Isolative  Motor Activity Slow  Appearance/Hygiene Poor hygiene;In scrubs  Behavior Characteristics Cooperative;Appropriate to situation  Mood Pleasant  Aggressive Behavior  Effect No apparent injury  Thought Process  Coherency Circumstantial  Content WDL  Delusions None reported or observed  Perception WDL  Hallucination None reported or observed  Judgment WDL  Confusion None  Danger to Self  Current suicidal ideation? Denies  Agreement Not to Harm Self Yes  Description of Agreement VERBAL  Danger to Others  Danger to Others None reported or observed

## 2022-08-06 NOTE — Group Note (Signed)
Date:  08/06/2022 Time:  9:53 PM  Group Topic/Focus:  Wrap-Up Group:   The focus of this group is to help patients review their daily goal of treatment and discuss progress on daily workbooks.    Participation Level:  Minimal  Participation Quality:  Appropriate  Affect:  Appropriate  Cognitive:  Alert, Appropriate, and Oriented  Insight: Appropriate  Engagement in Group:  Engaged and Improving  Modes of Intervention:  Activity  Additional Comments:     Maglione,Dayyan Krist E 08/06/2022, 9:53 PM

## 2022-08-06 NOTE — Progress Notes (Signed)
D- Patient alert and oriented x 2-3. Affect blank/mood preoccupied. Denies SI/ HI/ AVH. He denies pain, depression and anxiety. Continues to refuse Colace and Pepcid. A- Scheduled medications administered to patient, per MD orders except Colace and Pepcid. Support and encouragement provided.  Routine safety checks conducted every 15 minutes.  Patient informed to notify staff with problems or concerns and verbalizes understanding. R- No adverse drug reactions noted. Patient compliant with treatment plan. Patient receptive, calm, and cooperative. He contracts for safety and remains safe on the unit at this time.

## 2022-08-06 NOTE — Group Note (Signed)
Recreation Therapy Group Note   Group Topic:Healthy Support Systems  Group Date: 08/06/2022 Start Time: 1010 End Time: 1100 Facilitators: Rosina Lowenstein, LRT, CTRS Location:  Craft Room  Group Description: Straw Bridge. Patients were given 10 plastic drinking straws and an equal length of masking tape. Using the materials provided, patients were instructed to build a free-standing bridge-like structure to suspend an everyday item (ex: deck of cards) off the floor or table surface. All materials were required to be used in Secondary school teacher. LRT facilitated post-activity discussion reviewing the importance of having strong and healthy support systems in our lives. LRT discussed how the people in our lives serve as the tape and the book we placed on top of our straw structure are the stressors we face in daily life. LRT and pts discussed what happens in our life when things get too heavy for [place], and we don't have strong supports outside of the hospital. Pt shared 2 of their healthy supports aloud in the group.   Goal Area(s) Addressed:  Patient will identify 2 healthy supports in their life. Patient will identify skills to successfully complete activity. Patient will identify correlation of this activity to life post-discharge.    Affect/Mood: N/A   Participation Level: Did not attend    Clinical Observations/Individualized Feedback: Cornelio did not attend group due to resting in his room.  Plan: Continue to engage patient in RT group sessions 2-3x/week.   Rosina Lowenstein, LRT, CTRS 08/06/2022 11:31 AM

## 2022-08-07 DIAGNOSIS — F203 Undifferentiated schizophrenia: Secondary | ICD-10-CM | POA: Diagnosis not present

## 2022-08-07 LAB — CBC WITH DIFFERENTIAL/PLATELET
Abs Immature Granulocytes: 0.02 10*3/uL (ref 0.00–0.07)
Basophils Absolute: 0.1 10*3/uL (ref 0.0–0.1)
Basophils Relative: 1 %
Eosinophils Absolute: 0.1 10*3/uL (ref 0.0–0.5)
Eosinophils Relative: 2 %
HCT: 42.5 % (ref 39.0–52.0)
Hemoglobin: 14 g/dL (ref 13.0–17.0)
Immature Granulocytes: 0 %
Lymphocytes Relative: 37 %
Lymphs Abs: 2.6 10*3/uL (ref 0.7–4.0)
MCH: 28.5 pg (ref 26.0–34.0)
MCHC: 32.9 g/dL (ref 30.0–36.0)
MCV: 86.4 fL (ref 80.0–100.0)
Monocytes Absolute: 0.6 10*3/uL (ref 0.1–1.0)
Monocytes Relative: 9 %
Neutro Abs: 3.6 10*3/uL (ref 1.7–7.7)
Neutrophils Relative %: 51 %
Platelets: 215 10*3/uL (ref 150–400)
RBC: 4.92 MIL/uL (ref 4.22–5.81)
RDW: 13.1 % (ref 11.5–15.5)
WBC: 7 10*3/uL (ref 4.0–10.5)
nRBC: 0 % (ref 0.0–0.2)

## 2022-08-07 NOTE — Plan of Care (Signed)
Patient alert and oriented. Patient presented in a pleasant mood on assessment stating that he slept "good" last night and had no complaints to voice to this Clinical research associate. Patient denied SI, HI, AVH, and pain at this time. Patient also denied any signs/symptoms of depression and anxiety, stating that overall he is "doing well". Patient had no stated goals for today.   A- Some of scheduled medications administered to patient, per MD orders. Patient only wants to take Miralax and not his other two scheduled medication. Support and encouragement provided. Routine safety checks conducted every 15 minutes. Patient informed to notify staff with problems or concerns.   R- No adverse drug reactions noted. Patient contracts for safety at this time. Patient compliant with some medications. Patient receptive, calm, and cooperative. Patient isolates to room, except for meals and certain medication. Patient remains safe at this time.  Problem: Education: Goal: Knowledge of General Education information will improve Description: Including pain rating scale, medication(s)/side effects and non-pharmacologic comfort measures Outcome: Progressing   Problem: Health Behavior/Discharge Planning: Goal: Ability to manage health-related needs will improve Outcome: Progressing   Problem: Clinical Measurements: Goal: Ability to maintain clinical measurements within normal limits will improve Outcome: Progressing Goal: Will remain free from infection Outcome: Progressing Goal: Diagnostic test results will improve Outcome: Progressing Goal: Respiratory complications will improve Outcome: Progressing Goal: Cardiovascular complication will be avoided Outcome: Progressing   Problem: Activity: Goal: Risk for activity intolerance will decrease Outcome: Progressing   Problem: Nutrition: Goal: Adequate nutrition will be maintained Outcome: Progressing   Problem: Coping: Goal: Level of anxiety will decrease Outcome:  Progressing   Problem: Elimination: Goal: Will not experience complications related to urinary retention Outcome: Progressing   Problem: Pain Managment: Goal: General experience of comfort will improve Outcome: Progressing   Problem: Safety: Goal: Ability to remain free from injury will improve Outcome: Progressing   Problem: Skin Integrity: Goal: Risk for impaired skin integrity will decrease Outcome: Progressing   Problem: Education: Goal: Knowledge of  General Education information/materials will improve Outcome: Progressing Goal: Emotional status will improve Outcome: Progressing Goal: Mental status will improve Outcome: Progressing Goal: Verbalization of understanding the information provided will improve Outcome: Progressing   Problem: Activity: Goal: Interest or engagement in activities will improve Outcome: Progressing Goal: Sleeping patterns will improve Outcome: Progressing   Problem: Coping: Goal: Ability to verbalize frustrations and anger appropriately will improve Outcome: Progressing Goal: Ability to demonstrate self-control will improve Outcome: Progressing   Problem: Health Behavior/Discharge Planning: Goal: Identification of resources available to assist in meeting health care needs will improve Outcome: Progressing Goal: Compliance with treatment plan for underlying cause of condition will improve Outcome: Progressing   Problem: Physical Regulation: Goal: Ability to maintain clinical measurements within normal limits will improve Outcome: Progressing   Problem: Safety: Goal: Periods of time without injury will increase Outcome: Progressing   Problem: Activity: Goal: Will verbalize the importance of balancing activity with adequate rest periods Outcome: Progressing   Problem: Education: Goal: Will be free of psychotic symptoms Outcome: Progressing Goal: Knowledge of the prescribed therapeutic regimen will improve Outcome:  Progressing   Problem: Coping: Goal: Coping ability will improve Outcome: Progressing Goal: Will verbalize feelings Outcome: Progressing   Problem: Health Behavior/Discharge Planning: Goal: Compliance with prescribed medication regimen will improve Outcome: Progressing   Problem: Nutritional: Goal: Ability to achieve adequate nutritional intake will improve Outcome: Progressing   Problem: Role Relationship: Goal: Ability to communicate needs accurately will improve Outcome: Progressing Goal: Ability to interact with  others will improve Outcome: Progressing   Problem: Safety: Goal: Ability to redirect hostility and anger into socially appropriate behaviors will improve Outcome: Progressing Goal: Ability to remain free from injury will improve Outcome: Progressing   Problem: Self-Care: Goal: Ability to participate in self-care as condition permits will improve Outcome: Progressing   Problem: Self-Concept: Goal: Will verbalize positive feelings about self Outcome: Progressing

## 2022-08-07 NOTE — Group Note (Signed)
Recreation Therapy Group Note   Group Topic:Coping Skills  Group Date: 08/07/2022 Start Time: 1000 End Time: 1100 Facilitators: Rosina Lowenstein, LRT, CTRS Location:  Craft Room  Group Description: Mind Map.  Patient was provided a blank template of a diagram with 32 blank boxes in a tiered system, branching from the center (similar to a bubble chart). LRT directed patients to label the middle of the diagram "Coping Skills". LRT and patients then came up with 8 different coping skills as examples. Pt were directed to record their coping skills in the 2nd tier boxes closest to the center.  Patients would then share their coping skills with the group as LRT wrote them out. LRT gave a handout of 100 different coping skills at the end of group.   Goal Area(s) Addressed: Patients will be able to define "coping skills". Patient will identify new coping skills.  Patient will identify new possible leisure interests.    Affect/Mood: N/A   Participation Level: Did not attend    Clinical Observations/Individualized Feedback: Travis Sandoval did not attend group due to resting in his room.  Plan: Continue to engage patient in RT group sessions 2-3x/week.   Rosina Lowenstein, LRT, CTRS 08/07/2022 12:27 PM

## 2022-08-07 NOTE — Group Note (Signed)
[facility] LCSW Group Therapy Note   Group Date: 08/07/2022 Start Time: 1315 End Time: 1415   Type of Therapy/Topic:  Group Therapy:  Balance in Life  Participation Level:  Did Not Attend   Description of Group:    This group will address the concept of balance and how it feels and looks when one is unbalanced. Patients will be encouraged to process areas in their lives that are out of balance, and identify reasons for remaining unbalanced. Facilitators will guide patients utilizing problem- solving interventions to address and correct the stressor making their life unbalanced. Understanding and applying boundaries will be explored and addressed for obtaining  and maintaining a balanced life. Patients will be encouraged to explore ways to assertively make their unbalanced needs known to significant others in their lives, using other group members and facilitator for support and feedback.  Therapeutic Goals: Patient will identify two or more emotions or situations they have that consume much of in their lives. Patient will identify signs/triggers that life has become out of balance:  Patient will identify two ways to set boundaries in order to achieve balance in their lives:  Patient will demonstrate ability to communicate their needs through discussion and/or role plays  Summary of Patient Progress: X   Therapeutic Modalities:   Cognitive Behavioral Therapy Solution-Focused Therapy Assertiveness Training   Telesha Deguzman R Simmie Camerer, LCSW 

## 2022-08-07 NOTE — Progress Notes (Signed)
Patient visible in Milieu this shift compliant with treatment plan and attended scheduled programming. Compliant with medications, Denies any concerns. Q 15 minutes safety  checks ongoing Patient remains safe. Support and encouragement provided.

## 2022-08-07 NOTE — Progress Notes (Signed)
Patient calm and pleasant during assessment denying SI/HI/AVH. Pt compliant with medication administration per MD orders. Pt given education, support, and encouragement to be active in his treatment plan. Pt being monitored Q 15 minutes for safety per unit protocol, remains safe on the unit  

## 2022-08-07 NOTE — Consult Note (Signed)
Pharmacy Consult - Clozapine     42 yo male  This patient's order has been reviewed for prescribing contraindications.    Clozapine [facility] enrollment Verified: yes on 04/17/22 [facility] patient ID: [id number] Current Outpatient Monitoring: Every 2 weeks   Home Regimen:  225 mg po BID Last dose: unknown   Dose Adjustments This Admission: Dose started at clozapine 100 mg po daily Dose is currently clozapine 350 mg po HS (started 3/12) >  qHS 08/04/2022   Labs: Date    ANC    Submitted? 12/27 2800 Yes 01/03 3500 Yes 01/10   3100 Yes 01/24 3000 Yes 01/31 4400 yes   02/07 4300 yes 02/14 4500 Yes 02/21 5500 Yes 02/28 4080 Yes 03/06 4300 Yes  03/13 4100 Yes 03/20 4800 Yes 03/27 4800 Yes 04/03 4200 Yes 04/10 5400 Yes 04/17 3570 Yes  Plan: Continue clozapine 300 mg po HS Monitor ANC at least weekly while inpatient. Next CBC with differential on 08/07/22.   Travis Sandoval PharmD, BCPS 08/07/2022 12:09 PM

## 2022-08-07 NOTE — Progress Notes (Signed)
[facility] MD Progress Note  08/07/2022 11:14 AM Travis Sandoval  MRN:  [id number] Subjective: Follow-up patient with schizophrenia.  No new concerns no new issues.  Behavior stays the same.  Stays in his room most of the time.  No physical concerns. 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 No pertinent surgical history. Family History:  Family History  Family history unknown: Yes   Family Psychiatric  History: See previous Social History:  Social History   Substance and Sexual Activity  Alcohol Use Not Currently     Social History   Substance and Sexual Activity  Drug Use Not Currently    Social History   Socioeconomic History   Marital status: Unknown    Spouse name: Not on file   Number of children: Not on file   Years of education: Not on file   Highest education level: Not on file  Occupational History   Not on file  Tobacco Use   Smoking status: Every Day    Packs/day: 0.25    Years: 15.00    Additional pack years: 0.00    Total pack years: 3.75    Types: Cigarettes   Smokeless tobacco: Not on file  Vaping Use   Vaping Use: Not on file  Substance and Sexual Activity   Alcohol use: Not Currently   Drug use: Not Currently   Sexual activity: Not Currently  Other Topics Concern   Not on file  Social History Narrative   Not on file   Social Determinants of Health   Financial Resource Strain: Not on file  Food Insecurity: Patient Declined (05/18/2022)   Hunger Vital Sign    Worried About Running Out of Food in the Last Year: Patient declined    Ran Out of Food in the Last Year: Patient declined  Transportation Needs:  Patient Declined (05/18/2022)   PRAPARE - Administrator, Civil Service (Medical): Patient declined    Lack of Transportation (Non-Medical): Patient declined  Physical Activity: Not on file  Stress: Not on file  Social Connections: Not on file   Additional Social History:                         Sleep: Fair  Appetite:  Fair  Current Medications: Current Facility-Administered Medications  Medication Dose Route Frequency Provider Last Rate Last Admin   acetaminophen (TYLENOL) tablet 650 mg  650 mg Oral Q6H PRN Gabriel Cirri F, NP   650 mg at 08/02/22 0800   alum & mag hydroxide-simeth (MAALOX/MYLANTA) 200-200-20 MG/5ML suspension 30 mL  30 mL Oral Q4H PRN Gabriel Cirri F, NP   30 mL at 08/07/22 0923   ARIPiprazole ER (ABILIFY MAINTENA) injection 400 mg  400 mg Intramuscular Q28 days Larkyn Greenberger T, MD   400 mg at 07/25/22 1501   cloZAPine (CLOZARIL) tablet 300 mg  300 mg Oral QHS Michah Minton T, MD   300 mg at 08/06/22 2103   docusate sodium (COLACE) capsule 200 mg  200 mg Oral BID Keyonta Barradas T, MD   200 mg at 07/27/22 0939   famotidine (PEPCID) tablet 20 mg  20 mg  Oral BID Quamaine Webb, [name], MD   20 mg at 07/20/22 1740   hydrOXYzine (ATARAX) tablet 50 mg  50 mg Oral Q6H PRN Mariella Blackwelder T, MD   50 mg at 07/20/22 2102   magnesium citrate solution 1 Bottle  1 Bottle Oral Daily PRN Jaynie Bream, RPH   1 Bottle at 07/19/22 1643   magnesium hydroxide (MILK OF MAGNESIA) suspension 30 mL  30 mL Oral Daily PRN Jaynie Bream, RPH   30 mL at 07/18/22 1718   mineral oil liquid   Oral Daily PRN Sarina Ill, DO   Given at 07/17/22 1201   nicotine polacrilex (NICORETTE) gum 2 mg  2 mg Oral PRN Tyliyah Mcmeekin, [name], MD   2 mg at 04/23/22 2200   polyethylene glycol (MIRALAX / GLYCOLAX) packet 17 g  17 g Oral Daily Samanthan Dugo, [name], MD   17 g at 08/07/22 8295   senna-docusate (Senokot-S) tablet 2 tablet  2 tablet Oral Daily PRN Rudy Luhmann, [name], MD   2 tablet  at 07/14/22 0809   ziprasidone (GEODON) injection 20 mg  20 mg Intramuscular Q12H PRN Reggie Pile, MD        Lab Results: No results found for this or any previous visit (from the past 48 hour(s)).  Blood Alcohol level:  Lab Results  Component Value Date   ETH <10 04/15/2022    Metabolic Disorder Labs: Lab Results  Component Value Date   HGBA1C 5.5 04/24/2022   MPG 111 04/24/2022   No results found for: "PROLACTIN" Lab Results  Component Value Date   CHOL 220 (H) 04/24/2022   TRIG 700 (H) 04/24/2022   HDL 35 (L) 04/24/2022   CHOLHDL 6.3 04/24/2022   VLDL UNABLE TO CALCULATE IF TRIGLYCERIDE OVER 400 mg/dL 62/13/0865   LDLCALC UNABLE TO CALCULATE IF TRIGLYCERIDE OVER 400 mg/dL 78/46/9629    Physical Findings: AIMS: Facial and Oral Movements Muscles of Facial Expression: None, normal Lips and Perioral Area: None, normal Jaw: None, normal Tongue: None, normal,Extremity Movements Upper (arms, wrists, hands, fingers): None, normal Lower (legs, knees, ankles, toes): None, normal, Trunk Movements Neck, shoulders, hips: None, normal, Overall Severity Severity of abnormal movements (highest score from questions above): None, normal Incapacitation due to abnormal movements: None, normal Patient's awareness of abnormal movements (rate only patient's report): No Awareness, Dental Status Current problems with teeth and/or dentures?: No Does patient usually wear dentures?: No  CIWA:    COWS:     Musculoskeletal: Strength & Muscle Tone: within normal limits Gait & Station: normal Patient leans: N/A  Psychiatric Specialty Exam:  Presentation  General Appearance:  Disheveled  Eye Contact: Minimal  Speech: Pressured  Speech Volume: Normal  Handedness: Right   Mood and Affect  Mood: Euphoric; Irritable  Affect: Inappropriate   Thought Process  Thought Processes: Disorganized  Descriptions of Associations:Loose  Orientation:Partial  Thought  Content:Illogical; Scattered  History of Schizophrenia/Schizoaffective disorder:No data recorded Duration of Psychotic Symptoms:No data recorded Hallucinations:No data recorded Ideas of Reference:Delusions; Paranoia  Suicidal Thoughts:No data recorded Homicidal Thoughts:No data recorded  Sensorium  Memory: Immediate Poor; Recent Poor; Remote Poor  Judgment: Poor  Insight: Poor   Executive Functions  Concentration: Fair  Attention Span: Fair  Recall: Poor  Fund of Knowledge: Poor  Language: Poor   Psychomotor Activity  Psychomotor Activity:No data recorded  Assets  Assets: Desire for Improvement; Social Support   Sleep  Sleep:No data recorded   Physical Exam: Physical Exam Vitals and nursing note reviewed.  Constitutional:      Appearance: Normal appearance.  HENT:     Head: Normocephalic and atraumatic.     Mouth/Throat:     Pharynx: Oropharynx is clear.  Eyes:     Pupils: Pupils are equal, round, and reactive to light.  Cardiovascular:     Rate and Rhythm: Normal rate and regular rhythm.  Pulmonary:     Effort: Pulmonary effort is normal.     Breath sounds: Normal breath sounds.  Abdominal:     General: Abdomen is flat.     Palpations: Abdomen is soft.  Musculoskeletal:        General: Normal range of motion.  Skin:    General: Skin is warm and dry.  Neurological:     General: No focal deficit present.     Mental Status: He is alert. Mental status is at baseline.  Psychiatric:        Attention and Perception: Attention normal.        Mood and Affect: Mood normal. Affect is blunt.        Speech: Speech is delayed.        Behavior: Behavior is slowed.        Thought Content: Thought content normal.        Cognition and Memory: Cognition is impaired.    Review of Systems  Constitutional: Negative.   HENT: Negative.    Eyes: Negative.   Respiratory: Negative.    Cardiovascular: Negative.   Gastrointestinal: Negative.    Musculoskeletal: Negative.   Skin: Negative.   Neurological: Negative.   Psychiatric/Behavioral: Negative.     Blood pressure 114/83, pulse 99, temperature 98.5 F (36.9 C), temperature source Oral, resp. rate 18, height  (1.753 m), weight 96.4 kg, SpO2 98 %. Body mass index is 31.38 kg/m.   Treatment Plan Summary: Plan no evident change at the lower dose of clozapine either for the better or the worse.  Certainly should still be had a normal blood level.  Probably as stable as he gets.  Everything is still focused on trying to get him discharged.  Mordecai Rasmussen, MD 08/07/2022, 11:14 AM

## 2022-08-08 DIAGNOSIS — F203 Undifferentiated schizophrenia: Secondary | ICD-10-CM | POA: Diagnosis not present

## 2022-08-08 NOTE — Group Note (Signed)
Date:  08/08/2022 Time:  2:03 AM  Group Topic/Focus:  Wrap-Up Group:   The focus of this group is to help patients review their daily goal of treatment and discuss progress on daily workbooks.    Participation Level:  Active  Participation Quality:  Appropriate  Affect:  Appropriate  Cognitive:  Alert and Appropriate  Insight: Appropriate and Good  Engagement in Group:  Limited  Modes of Intervention:  Limit-setting  Additional Comments:     Kolbee Bogusz 08/08/2022, 2:03 AM

## 2022-08-08 NOTE — Progress Notes (Signed)
[facility] MD Progress Note  08/08/2022 11:11 AM Travis Sandoval  MRN:  [id number] Subjective: Follow-up 42 year old man with schizophrenia.  Patient seen and chart reviewed.  No change in presentation.  Stays in bed most of the time.  Has no complaints when confronted.  Vital stable no physical complaints.  Eating and drinking well. 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 No pertinent surgical history. Family History:  Family History  Family history unknown: Yes   Family Psychiatric  History: See previous Social History:  Social History   Substance and Sexual Activity  Alcohol Use Not Currently     Social History   Substance and Sexual Activity  Drug Use Not Currently    Social History   Socioeconomic History   Marital status: Unknown    Spouse name: Not on file   Number of children: Not on file   Years of education: Not on file   Highest education level: Not on file  Occupational History   Not on file  Tobacco Use   Smoking status: Every Day    Packs/day: 0.25    Years: 15.00    Additional pack years: 0.00    Total pack years: 3.75    Types: Cigarettes   Smokeless tobacco: Not on file  Vaping Use   Vaping Use: Not on file  Substance and Sexual Activity   Alcohol use: Not Currently   Drug use: Not Currently   Sexual activity: Not Currently  Other Topics Concern   Not on file  Social History Narrative   Not on file   Social Determinants of Health   Financial Resource Strain: Not on file  Food Insecurity: Patient Declined (05/18/2022)   Hunger Vital Sign    Worried About Running Out of Food in the Last Year: Patient  declined    Ran Out of Food in the Last Year: Patient declined  Transportation Needs: Patient Declined (05/18/2022)   PRAPARE - Administrator, Civil Service (Medical): Patient declined    Lack of Transportation (Non-Medical): Patient declined  Physical Activity: Not on file  Stress: Not on file  Social Connections: Not on file   Additional Social History:                         Sleep: Fair  Appetite:  Fair  Current Medications: Current Facility-Administered Medications  Medication Dose Route Frequency Provider Last Rate Last Admin   acetaminophen (TYLENOL) tablet 650 mg  650 mg Oral Q6H PRN Gabriel Cirri F, NP   650 mg at 08/02/22 0800   alum & mag hydroxide-simeth (MAALOX/MYLANTA) 200-200-20 MG/5ML suspension 30 mL  30 mL Oral Q4H PRN Gabriel Cirri F, NP   30 mL at 08/07/22 0923   ARIPiprazole ER (ABILIFY MAINTENA) injection 400 mg  400 mg Intramuscular Q28 days Natelie Ostrosky T, MD   400 mg at 07/25/22 1501   cloZAPine (CLOZARIL) tablet 300 mg  300 mg Oral QHS Shareen Capwell T, MD   300 mg at 08/07/22 2053   docusate sodium (COLACE) capsule 200 mg  200 mg Oral BID Undray Allman, [name], MD   200 mg at  07/27/22 0939   famotidine (PEPCID) tablet 20 mg  20 mg Oral BID Mariem Skolnick, [name], MD   20 mg at 07/20/22 1740   hydrOXYzine (ATARAX) tablet 50 mg  50 mg Oral Q6H PRN Caydin Yeatts, [name], MD   50 mg at 07/20/22 2102   magnesium citrate solution 1 Bottle  1 Bottle Oral Daily PRN Jaynie Bream, RPH   1 Bottle at 07/19/22 1643   magnesium hydroxide (MILK OF MAGNESIA) suspension 30 mL  30 mL Oral Daily PRN Jaynie Bream, RPH   30 mL at 07/18/22 1718   mineral oil liquid   Oral Daily PRN Sarina Ill, DO   Given at 07/17/22 1201   nicotine polacrilex (NICORETTE) gum 2 mg  2 mg Oral PRN Lovel Suazo, [name], MD   2 mg at 04/23/22 2200   polyethylene glycol (MIRALAX / GLYCOLAX) packet 17 g  17 g Oral Daily Julicia Krieger, [name], MD   17 g at 08/07/22 6578    senna-docusate (Senokot-S) tablet 2 tablet  2 tablet Oral Daily PRN Ember Gottwald, [name], MD   2 tablet at 07/14/22 0809   ziprasidone (GEODON) injection 20 mg  20 mg Intramuscular Q12H PRN Reggie Pile, MD        Lab Results:  Results for orders placed or performed during the hospital encounter of 04/16/22 (from the past 48 hour(s))  CBC with Differential/Platelet     Status: None   Collection Time: 08/07/22 10:55 AM  Result Value Ref Range   WBC 7.0 4.0 - 10.5 K/uL   RBC 4.92 4.22 - 5.81 MIL/uL   Hemoglobin 14.0 13.0 - 17.0 g/dL   HCT 46.9 62.9 - 52.8 %   MCV 86.4 80.0 - 100.0 fL   MCH 28.5 26.0 - 34.0 pg   MCHC 32.9 30.0 - 36.0 g/dL   RDW 41.3 24.4 - 01.0 %   Platelets 215 150 - 400 K/uL   nRBC 0.0 0.0 - 0.2 %   Neutrophils Relative % 51 %   Neutro Abs 3.6 1.7 - 7.7 K/uL   Lymphocytes Relative 37 %   Lymphs Abs 2.6 0.7 - 4.0 K/uL   Monocytes Relative 9 %   Monocytes Absolute 0.6 0.1 - 1.0 K/uL   Eosinophils Relative 2 %   Eosinophils Absolute 0.1 0.0 - 0.5 K/uL   Basophils Relative 1 %   Basophils Absolute 0.1 0.0 - 0.1 K/uL   Immature Granulocytes 0 %   Abs Immature Granulocytes 0.02 0.00 - 0.07 K/uL    Comment: Performed at [facility], [address] [place], [place] [address]    Blood Alcohol level:  Lab Results  Component Value Date   [facility] <10 04/15/2022    Metabolic Disorder Labs: Lab Results  Component Value Date   HGBA1C 5.5 04/24/2022   MPG 111 04/24/2022   No results found for: "PROLACTIN" Lab Results  Component Value Date   CHOL 220 (H) 04/24/2022   TRIG 700 (H) 04/24/2022   HDL 35 (L) 04/24/2022   CHOLHDL 6.3 04/24/2022   VLDL UNABLE TO CALCULATE IF TRIGLYCERIDE OVER 400 mg/dL 66/44/0347   LDLCALC UNABLE TO CALCULATE IF TRIGLYCERIDE OVER 400 mg/dL 42/59/5638    Physical Findings: AIMS: Facial and Oral Movements Muscles of Facial Expression: None, normal Lips and Perioral Area: None, normal Jaw: None, normal Tongue: None,  normal,Extremity Movements Upper (arms, wrists, hands, fingers): None, normal Lower (legs, knees, ankles, toes): None, normal, Trunk Movements Neck, shoulders, hips: None, normal, Overall  Severity Severity of abnormal movements (highest score from questions above): None, normal Incapacitation due to abnormal movements: None, normal Patient's awareness of abnormal movements (rate only patient's report): No Awareness, Dental Status Current problems with teeth and/or dentures?: No Does patient usually wear dentures?: No  CIWA:    COWS:     Musculoskeletal: Strength & Muscle Tone: within normal limits Gait & Station: normal Patient leans: N/A  Psychiatric Specialty Exam:  Presentation  General Appearance:  Disheveled  Eye Contact: Minimal  Speech: Pressured  Speech Volume: Normal  Handedness: Right   Mood and Affect  Mood: Euphoric; Irritable  Affect: Inappropriate   Thought Process  Thought Processes: Disorganized  Descriptions of Associations:Loose  Orientation:Partial  Thought Content:Illogical; Scattered  History of Schizophrenia/Schizoaffective disorder:No data recorded Duration of Psychotic Symptoms:No data recorded Hallucinations:No data recorded Ideas of Reference:Delusions; Paranoia  Suicidal Thoughts:No data recorded Homicidal Thoughts:No data recorded  Sensorium  Memory: Immediate Poor; Recent Poor; Remote Poor  Judgment: Poor  Insight: Poor   Executive Functions  Concentration: Fair  Attention Span: Fair  Recall: Poor  Fund of Knowledge: Poor  Language: Poor   Psychomotor Activity  Psychomotor Activity:No data recorded  Assets  Assets: Desire for Improvement; Social Support   Sleep  Sleep:No data recorded   Physical Exam: Physical Exam Vitals reviewed.  Constitutional:      Appearance: Normal appearance.  HENT:     Head: Normocephalic and atraumatic.     Mouth/Throat:     Pharynx: Oropharynx is  clear.  Eyes:     Pupils: Pupils are equal, round, and reactive to light.  Cardiovascular:     Rate and Rhythm: Normal rate and regular rhythm.  Pulmonary:     Effort: Pulmonary effort is normal.     Breath sounds: Normal breath sounds.  Abdominal:     General: Abdomen is flat.     Palpations: Abdomen is soft.  Musculoskeletal:        General: Normal range of motion.  Skin:    General: Skin is warm and dry.  Neurological:     General: No focal deficit present.     Mental Status: He is alert. Mental status is at baseline.  Psychiatric:        Attention and Perception: Attention normal.        Mood and Affect: Mood normal.        Speech: Speech normal.        Thought Content: Thought content normal.    Review of Systems  Constitutional: Negative.   HENT: Negative.    Eyes: Negative.   Respiratory: Negative.    Cardiovascular: Negative.   Gastrointestinal: Negative.   Musculoskeletal: Negative.   Skin: Negative.   Neurological: Negative.   Psychiatric/Behavioral: Negative.     Blood pressure 123/77, pulse (!) 103, temperature 98 F (36.7 C), temperature source Oral, resp. rate 16, height 5\' 9"  (1.753 m), weight 96.4 kg, SpO2 95 %. Body mass index is 31.38 kg/m.   Treatment Plan Summary: Plan currently on clozapine.  Affective blood level.  Behavior is stable and we are really still just waiting on the finances to come together so he can be discharged some point.  Mordecai Rasmussen, MD 08/08/2022, 11:11 AM

## 2022-08-08 NOTE — Group Note (Signed)
[facility] LCSW Group Therapy Note   Group Date: 08/08/2022 Start Time: 1310 End Time: 1410  Type of Therapy and Topic:  Group Therapy:  Feelings around Relapse and Recovery  Participation Level:  Did Not Attend    Description of Group:    Patients in this group will discuss emotions they experience before and after a relapse. They will process how experiencing these feelings, or avoidance of experiencing them, relates to having a relapse. Facilitator will guide patients to explore emotions they have related to recovery. Patients will be encouraged to process which emotions are more powerful. They will be guided to discuss the emotional reaction significant others in their lives may have to patients' relapse or recovery. Patients will be assisted in exploring ways to respond to the emotions of others without this contributing to a relapse.  Therapeutic Goals: Patient will identify two or more emotions that lead to relapse for them:  Patient will identify two emotions that result when they relapse:  Patient will identify two emotions related to recovery:  Patient will demonstrate ability to communicate their needs through discussion and/or role plays.   Summary of Patient Progress: X   Therapeutic Modalities:   Cognitive Behavioral Therapy Solution-Focused Therapy Assertiveness Training Relapse Prevention Therapy   Dakari Cregger R Alorah Mcree, LCSW 

## 2022-08-08 NOTE — Group Note (Signed)
Date:  08/08/2022 Time:  11:12 PM  Group Topic/Focus:  Wrap-Up Group:   The focus of this group is to help patients review their daily goal of treatment and discuss progress on daily workbooks.    Participation Level:  Active  Participation Quality:  Appropriate and Attentive  Affect:  Appropriate  Cognitive:  Alert and Appropriate  Insight: Appropriate  Engagement in Group:  Engaged  Modes of Intervention:  Discussion, Education, Socialization, and Support  Additional Comments:     Travis Sandoval 08/08/2022, 11:12 PM

## 2022-08-08 NOTE — Group Note (Signed)
Recreation Therapy Group Note   Group Topic:Relaxation  Group Date: 08/08/2022 Start Time: 1000 End Time: 1045 Facilitators: Rosina Lowenstein, LRT, CTRS Location:  Craft Room  Group Description: Meditation. LRT asks patients their current level of stress/anxiety from 1-10, with 10 being the highest. LRT educated on the benefits of mindfulness and how it can apply to everyday life post-discharge. LRT and pt's followed along to an audio script of a "guided meditation" video. LRT asked pt their level of stress and anxiety once the prompt was finished. LRT facilitated post-activity processing to gain feedback on session.  Goal Area(s) Addressed:  Patient will practice using relaxation technique. Patient will identify a new coping skill.  Patient will follow multistep directions to reduce anxiety and stress.   Affect/Mood: N/A   Participation Level: Did not attend    Clinical Observations/Individualized Feedback: Tajee did not attend group due to resting in his room.  Plan: Continue to engage patient in RT group sessions 2-3x/week.   Rosina Lowenstein, LRT, CTRS 08/08/2022 11:01 AM

## 2022-08-08 NOTE — Progress Notes (Signed)
Patient isolative to room, pleasant, cooperative but declines all morning and afternoon medications stating he will only take PM medications.  Pt is calm and voices no complaints.  Continued monitoring for safety.  08/08/22 1500  Psych Admission Type (Psych Patients Only)  Admission Status Voluntary  Psychosocial Assessment  Patient Complaints None  Eye Contact Fair  Facial Expression Flat  Affect Appropriate to circumstance  Speech Logical/coherent  Interaction Assertive  Motor Activity Slow  Appearance/Hygiene Poor hygiene  Behavior Characteristics Resistant to care  Mood Pleasant  Aggressive Behavior  Effect No apparent injury  Thought Process  Coherency WDL  Content WDL  Delusions None reported or observed  Perception WDL  Hallucination None reported or observed  Judgment WDL  Confusion None  Danger to Self  Current suicidal ideation? Denies  Agreement Not to Harm Self Yes  Description of Agreement verbal  Danger to Others  Danger to Others None reported or observed

## 2022-08-08 NOTE — Progress Notes (Signed)
Patient calm and pleasant during assessment denying SI/HI/AVH. Pt refused his medication tonight because we are out of the 100 mg tablets and he didn't want to take 12 of the 25 mg tablets. Pt given education, support, and encouragement to be active in his treatment plan. Pt being monitored Q 15 minutes for safety per unit protocol, remains safe on the unit

## 2022-08-09 DIAGNOSIS — F203 Undifferentiated schizophrenia: Secondary | ICD-10-CM | POA: Diagnosis not present

## 2022-08-09 NOTE — Progress Notes (Signed)
[facility] MD Progress Note  08/09/2022 11:02 AM Travis Sandoval  MRN:  [id number] Subjective: No new complaints.  Denies hallucinations.  Stays mostly to himself.  No aggressive or dangerous behavior. 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 No pertinent surgical history. Family History:  Family History  Family history unknown: Yes   Family Psychiatric  History: See previous Social History:  Social History   Substance and Sexual Activity  Alcohol Use Not Currently     Social History   Substance and Sexual Activity  Drug Use Not Currently    Social History   Socioeconomic History   Marital status: Unknown    Spouse name: Not on file   Number of children: Not on file   Years of education: Not on file   Highest education level: Not on file  Occupational History   Not on file  Tobacco Use   Smoking status: Every Day    Packs/day: 0.25    Years: 15.00    Additional pack years: 0.00    Total pack years: 3.75    Types: Cigarettes   Smokeless tobacco: Not on file  Vaping Use   Vaping Use: Not on file  Substance and Sexual Activity   Alcohol use: Not Currently   Drug use: Not Currently   Sexual activity: Not Currently  Other Topics Concern   Not on file  Social History Narrative   Not on file   Social Determinants of Health   Financial Resource Strain: Not on file  Food Insecurity: Patient Declined (05/18/2022)   Hunger Vital Sign    Worried About Running Out of Food in the Last Year: Patient declined    Ran Out of Food in the Last Year: Patient declined  Transportation Needs: Patient Declined (05/18/2022)   PRAPARE -  Administrator, Civil Service (Medical): Patient declined    Lack of Transportation (Non-Medical): Patient declined  Physical Activity: Not on file  Stress: Not on file  Social Connections: Not on file   Additional Social History:                         Sleep: Fair  Appetite:  Fair  Current Medications: Current Facility-Administered Medications  Medication Dose Route Frequency Provider Last Rate Last Admin   acetaminophen (TYLENOL) tablet 650 mg  650 mg Oral Q6H PRN Gabriel Cirri F, NP   650 mg at 08/02/22 0800   alum & mag hydroxide-simeth (MAALOX/MYLANTA) 200-200-20 MG/5ML suspension 30 mL  30 mL Oral Q4H PRN Gabriel Cirri F, NP   30 mL at 08/07/22 0923   ARIPiprazole ER (ABILIFY MAINTENA) injection 400 mg  400 mg Intramuscular Q28 days Malikah Principato T, MD   400 mg at 07/25/22 1501   cloZAPine (CLOZARIL) tablet 300 mg  300 mg Oral QHS Sharena Dibenedetto T, MD   300 mg at 08/07/22 2053   docusate sodium (COLACE) capsule 200 mg  200 mg Oral BID Rinda Rollyson T, MD   200 mg at 07/27/22 0939   famotidine (PEPCID) tablet 20 mg  20 mg Oral BID Lucian Baswell, [name], MD   20 mg at 07/20/22  1740   hydrOXYzine (ATARAX) tablet 50 mg  50 mg Oral Q6H PRN Henery Betzold T, MD   50 mg at 07/20/22 2102   magnesium citrate solution 1 Bottle  1 Bottle Oral Daily PRN Jaynie Bream, RPH   1 Bottle at 07/19/22 1643   magnesium hydroxide (MILK OF MAGNESIA) suspension 30 mL  30 mL Oral Daily PRN Jaynie Bream, RPH   30 mL at 07/18/22 1718   mineral oil liquid   Oral Daily PRN Sarina Ill, DO   Given at 07/17/22 1201   nicotine polacrilex (NICORETTE) gum 2 mg  2 mg Oral PRN Takyia Sindt, [name], MD   2 mg at 04/23/22 2200   polyethylene glycol (MIRALAX / GLYCOLAX) packet 17 g  17 g Oral Daily Phallon Haydu, [name], MD   17 g at 08/09/22 0930   senna-docusate (Senokot-S) tablet 2 tablet  2 tablet Oral Daily PRN Renaldo Gornick, [name], MD   2 tablet at 07/14/22 0809   ziprasidone (GEODON)  injection 20 mg  20 mg Intramuscular Q12H PRN Reggie Pile, MD        Lab Results: No results found for this or any previous visit (from the past 48 hour(s)).  Blood Alcohol level:  Lab Results  Component Value Date   ETH <10 04/15/2022    Metabolic Disorder Labs: Lab Results  Component Value Date   HGBA1C 5.5 04/24/2022   MPG 111 04/24/2022   No results found for: "PROLACTIN" Lab Results  Component Value Date   CHOL 220 (H) 04/24/2022   TRIG 700 (H) 04/24/2022   HDL 35 (L) 04/24/2022   CHOLHDL 6.3 04/24/2022   VLDL UNABLE TO CALCULATE IF TRIGLYCERIDE OVER 400 mg/dL 16/01/9603   LDLCALC UNABLE TO CALCULATE IF TRIGLYCERIDE OVER 400 mg/dL 54/12/8117    Physical Findings: AIMS: Facial and Oral Movements Muscles of Facial Expression: None, normal Lips and Perioral Area: None, normal Jaw: None, normal Tongue: None, normal,Extremity Movements Upper (arms, wrists, hands, fingers): None, normal Lower (legs, knees, ankles, toes): None, normal, Trunk Movements Neck, shoulders, hips: None, normal, Overall Severity Severity of abnormal movements (highest score from questions above): None, normal Incapacitation due to abnormal movements: None, normal Patient's awareness of abnormal movements (rate only patient's report): No Awareness, Dental Status Current problems with teeth and/or dentures?: No Does patient usually wear dentures?: No  CIWA:    COWS:     Musculoskeletal: Strength & Muscle Tone: within normal limits Gait & Station: normal Patient leans: N/A  Psychiatric Specialty Exam:  Presentation  General Appearance:  Disheveled  Eye Contact: Minimal  Speech: Pressured  Speech Volume: Normal  Handedness: Right   Mood and Affect  Mood: Euphoric; Irritable  Affect: Inappropriate   Thought Process  Thought Processes: Disorganized  Descriptions of Associations:Loose  Orientation:Partial  Thought Content:Illogical; Scattered  History of  Schizophrenia/Schizoaffective disorder:No data recorded Duration of Psychotic Symptoms:No data recorded Hallucinations:No data recorded Ideas of Reference:Delusions; Paranoia  Suicidal Thoughts:No data recorded Homicidal Thoughts:No data recorded  Sensorium  Memory: Immediate Poor; Recent Poor; Remote Poor  Judgment: Poor  Insight: Poor   Executive Functions  Concentration: Fair  Attention Span: Fair  Recall: Poor  Fund of Knowledge: Poor  Language: Poor   Psychomotor Activity  Psychomotor Activity:No data recorded  Assets  Assets: Desire for Improvement; Social Support   Sleep  Sleep:No data recorded   Physical Exam: Physical Exam Vitals and nursing note reviewed.  Constitutional:      Appearance: Normal appearance.  HENT:  Head: Normocephalic and atraumatic.     Mouth/Throat:     Pharynx: Oropharynx is clear.  Eyes:     Pupils: Pupils are equal, round, and reactive to light.  Cardiovascular:     Rate and Rhythm: Normal rate and regular rhythm.  Pulmonary:     Effort: Pulmonary effort is normal.     Breath sounds: Normal breath sounds.  Abdominal:     General: Abdomen is flat.     Palpations: Abdomen is soft.  Musculoskeletal:        General: Normal range of motion.  Skin:    General: Skin is warm and dry.  Neurological:     General: No focal deficit present.     Mental Status: He is alert. Mental status is at baseline.  Psychiatric:        Attention and Perception: Attention normal.        Mood and Affect: Mood normal. Affect is blunt.        Speech: Speech is delayed.        Behavior: Behavior is withdrawn.    Review of Systems  Constitutional: Negative.   HENT: Negative.    Eyes: Negative.   Respiratory: Negative.    Cardiovascular: Negative.   Gastrointestinal: Negative.   Musculoskeletal: Negative.   Skin: Negative.   Neurological: Negative.   Psychiatric/Behavioral: Negative.     Blood pressure 125/82, pulse 93,  temperature 97.8 F (36.6 C), temperature source Oral, resp. rate 20, height  (1.753 m), weight 96.4 kg, SpO2 95 %. Body mass index is 31.38 kg/m.   Treatment Plan Summary: Plan no change to medication profile.  Unfortunately we are still just waiting on a way to manage discharge.  Mordecai Rasmussen, MD 08/09/2022, 11:02 AM

## 2022-08-09 NOTE — Plan of Care (Signed)
D- Patient alert and oriented. Patient presented in a pleasant mood on assessment stating that she slept ok last night and had continued complaints of congestion, cough, in which she requested PRN medication to help with relief. Denies SI, HI, AVH, and pain. Quotes. Goal. How did they do with achieving previous goal.  A- Scheduled medications administered to patient, per MD orders. Support and encouragement provided.  Routine safety checks conducted every 15 minutes.  Patient informed to notify staff with problems or concerns.  R- No adverse drug reactions noted. Patient contracts for safety at this time. Patient compliant with medications and treatment plan. Patient receptive, calm, and cooperative. Patient interacts well with others on the unit.  Patient remains safe at this time.  Problem: Education: Goal: Knowledge of General Education information will improve Description: Including pain rating scale, medication(s)/side effects and non-pharmacologic comfort measures Outcome: Progressing   Problem: Health Behavior/Discharge Planning: Goal: Ability to manage health-related needs will improve Outcome: Progressing   Problem: Clinical Measurements: Goal: Ability to maintain clinical measurements within normal limits will improve Outcome: Progressing Goal: Will remain free from infection Outcome: Progressing Goal: Diagnostic test results will improve Outcome: Progressing Goal: Respiratory complications will improve Outcome: Progressing Goal: Cardiovascular complication will be avoided Outcome: Progressing   Problem: Activity: Goal: Risk for activity intolerance will decrease Outcome: Progressing   Problem: Nutrition: Goal: Adequate nutrition will be maintained Outcome: Progressing   Problem: Coping: Goal: Level of anxiety will decrease Outcome: Progressing   Problem: Elimination: Goal: Will not experience complications related to urinary retention Outcome: Progressing    Problem: Pain Managment: Goal: General experience of comfort will improve Outcome: Progressing   Problem: Safety: Goal: Ability to remain free from injury will improve Outcome: Progressing   Problem: Skin Integrity: Goal: Risk for impaired skin integrity will decrease Outcome: Progressing   Problem: Education: Goal: Knowledge of [place] [facility] Education information/materials will improve Outcome: Progressing Goal: Emotional status will improve Outcome: Progressing Goal: Mental status will improve Outcome: Progressing Goal: Verbalization of understanding the information provided will improve Outcome: Progressing   Problem: Activity: Goal: Interest or engagement in activities will improve Outcome: Progressing Goal: Sleeping patterns will improve Outcome: Progressing   Problem: Coping: Goal: Ability to verbalize frustrations and anger appropriately will improve Outcome: Progressing Goal: Ability to demonstrate self-control will improve Outcome: Progressing   Problem: Health Behavior/Discharge Planning: Goal: Identification of resources available to assist in meeting health care needs will improve Outcome: Progressing Goal: Compliance with treatment plan for underlying cause of condition will improve Outcome: Progressing   Problem: Physical Regulation: Goal: Ability to maintain clinical measurements within normal limits will improve Outcome: Progressing   Problem: Safety: Goal: Periods of time without injury will increase Outcome: Progressing   Problem: Activity: Goal: Will verbalize the importance of balancing activity with adequate rest periods Outcome: Progressing   Problem: Education: Goal: Will be free of psychotic symptoms Outcome: Progressing Goal: Knowledge of the prescribed therapeutic regimen will improve Outcome: Progressing   Problem: Coping: Goal: Coping ability will improve Outcome: Progressing Goal: Will verbalize feelings Outcome:  Progressing   Problem: Health Behavior/Discharge Planning: Goal: Compliance with prescribed medication regimen will improve Outcome: Progressing   Problem: Nutritional: Goal: Ability to achieve adequate nutritional intake will improve Outcome: Progressing   Problem: Role Relationship: Goal: Ability to communicate needs accurately will improve Outcome: Progressing Goal: Ability to interact with others will improve Outcome: Progressing   Problem: Safety: Goal: Ability to redirect hostility and anger into socially appropriate behaviors will improve  Outcome: Progressing Goal: Ability to remain free from injury will improve Outcome: Progressing   Problem: Self-Care: Goal: Ability to participate in self-care as condition permits will improve Outcome: Progressing   Problem: Self-Concept: Goal: Will verbalize positive feelings about self Outcome: Progressing

## 2022-08-09 NOTE — Group Note (Signed)
Recreation Therapy Group Note   Group Topic:Leisure Education  Group Date: 08/09/2022 Start Time: 1000 End Time: 1115 Facilitators: Rosina Lowenstein LRT, CTRS Location:  Craft Room  Group Description: Leisure. Patients were given the option to choose from singing karaoke, make origami, playing with a deck of cards, or coloring mandalas. LRT and pts discussed the importance of participating in leisure during their free time and when they're outside of the hospital. Pt identified two leisure interests and shared with the group.   Goal Area(s) Addressed:  Patient will identify a current leisure interest.  Patient will practice making a positive decision. Patient will have the opportunity to try a new leisure activity.  Affect/Mood: N/A   Participation Level: Did not attend    Clinical Observations/Individualized Feedback: Travis Sandoval did not attend group due to resting in his room.  Plan: Continue to engage patient in RT group sessions 2-3x/week.   Rosina Lowenstein, LRT, CTRS 08/09/2022 11:29 AM

## 2022-08-09 NOTE — Progress Notes (Signed)
Patient calm and pleasant during assessment denying SI/HI/AVH. Pt compliant with medication administration per MD orders. Pt given education, support, and encouragement to be active in his treatment plan. Pt being monitored Q 15 minutes for safety per unit protocol, remains safe on the unit  

## 2022-08-09 NOTE — Group Note (Signed)
Date:  08/09/2022 Time:  11:12 PM  Group Topic/Focus:  Wrap-Up Group:   The focus of this group is to help patients review their daily goal of treatment and discuss progress on daily workbooks.    Participation Level:  Active  Participation Quality:  Appropriate  Affect:  Appropriate and Flat  Cognitive:  Alert, Appropriate, and Oriented  Insight: Appropriate and Good  Engagement in Group:  Engaged and Improving  Modes of Intervention:  Discussion and Exploration  Additional Comments:     Maglione,Loui Massenburg E 08/09/2022, 11:12 PM

## 2022-08-10 DIAGNOSIS — F200 Paranoid schizophrenia: Secondary | ICD-10-CM | POA: Diagnosis not present

## 2022-08-10 NOTE — Plan of Care (Signed)
D- Patient alert and oriented. Patient presented in a pleasant mood on assessment stating that he slept "good" last night and had no complaints to voice to this Clinical research associate. Patient denied SI, HI, AVH, and pain at this time. Patient denied signs/symptoms of both depression and anxiety. Patient had no stated goals for today.   A- Some of scheduled medications administered to patient, per MD orders. Patient continues to only want Miralax and nothing else. Support and encouragement provided. Routine safety checks conducted every 15 minutes. Patient informed to notify staff with problems or concerns.   R- No adverse drug reactions noted. Patient contracts for safety at this time. Patient compliant with some medications. Patient receptive, calm, and cooperative. Patient isolates to room, except for meals and to take certain medications. Patient remains safe at this time.  Problem: Education: Goal: Knowledge of General Education information will improve Description: Including pain rating scale, medication(s)/side effects and non-pharmacologic comfort measures Outcome: Progressing   Problem: Health Behavior/Discharge Planning: Goal: Ability to manage health-related needs will improve Outcome: Progressing   Problem: Clinical Measurements: Goal: Ability to maintain clinical measurements within normal limits will improve Outcome: Progressing Goal: Will remain free from infection Outcome: Progressing Goal: Diagnostic test results will improve Outcome: Progressing Goal: Respiratory complications will improve Outcome: Progressing Goal: Cardiovascular complication will be avoided Outcome: Progressing   Problem: Activity: Goal: Risk for activity intolerance will decrease Outcome: Progressing   Problem: Nutrition: Goal: Adequate nutrition will be maintained Outcome: Progressing   Problem: Coping: Goal: Level of anxiety will decrease Outcome: Progressing   Problem: Elimination: Goal: Will not  experience complications related to urinary retention Outcome: Progressing   Problem: Pain Managment: Goal: General experience of comfort will improve Outcome: Progressing   Problem: Safety: Goal: Ability to remain free from injury will improve Outcome: Progressing   Problem: Skin Integrity: Goal: Risk for impaired skin integrity will decrease Outcome: Progressing   Problem: Education: Goal: Knowledge of [place] [facility] Education information/materials will improve Outcome: Progressing Goal: Emotional status will improve Outcome: Progressing Goal: Mental status will improve Outcome: Progressing Goal: Verbalization of understanding the information provided will improve Outcome: Progressing   Problem: Activity: Goal: Interest or engagement in activities will improve Outcome: Progressing Goal: Sleeping patterns will improve Outcome: Progressing   Problem: Coping: Goal: Ability to verbalize frustrations and anger appropriately will improve Outcome: Progressing Goal: Ability to demonstrate self-control will improve Outcome: Progressing   Problem: Health Behavior/Discharge Planning: Goal: Identification of resources available to assist in meeting health care needs will improve Outcome: Progressing Goal: Compliance with treatment plan for underlying cause of condition will improve Outcome: Progressing   Problem: Physical Regulation: Goal: Ability to maintain clinical measurements within normal limits will improve Outcome: Progressing   Problem: Safety: Goal: Periods of time without injury will increase Outcome: Progressing   Problem: Activity: Goal: Will verbalize the importance of balancing activity with adequate rest periods Outcome: Progressing   Problem: Education: Goal: Will be free of psychotic symptoms Outcome: Progressing Goal: Knowledge of the prescribed therapeutic regimen will improve Outcome: Progressing   Problem: Coping: Goal: Coping ability  will improve Outcome: Progressing Goal: Will verbalize feelings Outcome: Progressing   Problem: Health Behavior/Discharge Planning: Goal: Compliance with prescribed medication regimen will improve Outcome: Progressing   Problem: Nutritional: Goal: Ability to achieve adequate nutritional intake will improve Outcome: Progressing   Problem: Role Relationship: Goal: Ability to communicate needs accurately will improve Outcome: Progressing Goal: Ability to interact with others will improve Outcome: Progressing   Problem: Safety:  Goal: Ability to redirect hostility and anger into socially appropriate behaviors will improve Outcome: Progressing Goal: Ability to remain free from injury will improve Outcome: Progressing   Problem: Self-Care: Goal: Ability to participate in self-care as condition permits will improve Outcome: Progressing   Problem: Self-Concept: Goal: Will verbalize positive feelings about self Outcome: Progressing

## 2022-08-10 NOTE — Progress Notes (Signed)
Patient stated that he wanted to take his scheduled Miralax later on today. This Clinical research associate moved it. to 1200.

## 2022-08-10 NOTE — Progress Notes (Signed)
[facility] MD Progress Note  08/10/2022 11:10 AM Travis Sandoval  MRN:  [id number] Subjective: Travis Sandoval is seen on rounds.  He gives me a thumbs up and states that there is no problems.  He has been compliant with medications.  Nurses report no issues. Principal Problem: Undifferentiated schizophrenia Diagnosis: Principal Problem:   Undifferentiated schizophrenia  Total Time spent with patient: 15 minutes  Past Psychiatric History: Schizophrenia  Past Medical History:  Past Medical History:  Diagnosis Date   ADHD    Bipolar 1 disorder (HCC)    Hepatitis C    Schizoaffective disorder (HCC)    History reviewed. No pertinent surgical history. Family History:  Family History  Family history unknown: Yes   Family Psychiatric  History: Unremarkable Social History:  Social History   Substance and Sexual Activity  Alcohol Use Not Currently     Social History   Substance and Sexual Activity  Drug Use Not Currently    Social History   Socioeconomic History   Marital status: Unknown    Spouse name: Not on file   Number of children: Not on file   Years of education: Not on file   Highest education level: Not on file  Occupational History   Not on file  Tobacco Use   Smoking status: Every Day    Packs/day: 0.25    Years: 15.00    Additional pack years: 0.00    Total pack years: 3.75    Types: Cigarettes   Smokeless tobacco: Not on file  Vaping Use   Vaping Use: Not on file  Substance and Sexual Activity   Alcohol use: Not Currently   Drug use: Not Currently   Sexual activity: Not Currently  Other Topics Concern   Not on file  Social History Narrative   Not on file   Social Determinants of Health   Financial Resource Strain: Not on file  Food Insecurity: Patient Declined (05/18/2022)   Hunger Vital Sign    Worried About Running Out of Food in the Last Year: Patient declined    Ran Out of Food in the Last Year: Patient declined  Transportation Needs: Patient Declined  (05/18/2022)   PRAPARE - Administrator, Civil Service (Medical): Patient declined    Lack of Transportation (Non-Medical): Patient declined  Physical Activity: Not on file  Stress: Not on file  Social Connections: Not on file   Additional Social History:                         Sleep: Good  Appetite:  Good  Current Medications: Current Facility-Administered Medications  Medication Dose Route Frequency Provider Last Rate Last Admin   acetaminophen (TYLENOL) tablet 650 mg  650 mg Oral Q6H PRN Gabriel Cirri F, NP   650 mg at 08/02/22 0800   alum & mag hydroxide-simeth (MAALOX/MYLANTA) 200-200-20 MG/5ML suspension 30 mL  30 mL Oral Q4H PRN Gabriel Cirri F, NP   30 mL at 08/07/22 0923   ARIPiprazole ER (ABILIFY MAINTENA) injection 400 mg  400 mg Intramuscular Q28 days [name], [name], MD   400 mg at 07/25/22 1501   cloZAPine (CLOZARIL) tablet 300 mg  300 mg Oral QHS [name], [name], MD   300 mg at 08/09/22 2111   docusate sodium (COLACE) capsule 200 mg  200 mg Oral BID [name], [name], MD   200 mg at 07/27/22 0939   famotidine (PEPCID) tablet 20 mg  20 mg Oral BID  [name], [name], MD   20 mg at 07/20/22 1740   hydrOXYzine (ATARAX) tablet 50 mg  50 mg Oral Q6H PRN [name], [name], MD   50 mg at 07/20/22 2102   magnesium citrate solution 1 Bottle  1 Bottle Oral Daily PRN Jaynie Bream, RPH   1 Bottle at 07/19/22 1643   magnesium hydroxide (MILK OF MAGNESIA) suspension 30 mL  30 mL Oral Daily PRN Jaynie Bream, RPH   30 mL at 07/18/22 1718   mineral oil liquid   Oral Daily PRN Sarina Ill, DO   Given at 07/17/22 1201   nicotine polacrilex (NICORETTE) gum 2 mg  2 mg Oral PRN [name], [name], MD   2 mg at 04/23/22 2200   polyethylene glycol (MIRALAX / GLYCOLAX) packet 17 g  17 g Oral Daily [name], [name], MD   17 g at 08/09/22 0930   senna-docusate (Senokot-S) tablet 2 tablet  2 tablet Oral Daily PRN [name], [name], MD   2 tablet at 07/14/22 0809    ziprasidone (GEODON) injection 20 mg  20 mg Intramuscular Q12H PRN Reggie Pile, MD        Lab Results: No results found for this or any previous visit (from the past 48 hour(s)).  Blood Alcohol level:  Lab Results  Component Value Date   ETH <10 04/15/2022    Metabolic Disorder Labs: Lab Results  Component Value Date   HGBA1C 5.5 04/24/2022   MPG 111 04/24/2022   No results found for: "PROLACTIN" Lab Results  Component Value Date   CHOL 220 (H) 04/24/2022   TRIG 700 (H) 04/24/2022   HDL 35 (L) 04/24/2022   CHOLHDL 6.3 04/24/2022   VLDL UNABLE TO CALCULATE IF TRIGLYCERIDE OVER 400 mg/dL 16/01/9603   LDLCALC UNABLE TO CALCULATE IF TRIGLYCERIDE OVER 400 mg/dL 54/12/8117    Physical Findings: AIMS: Facial and Oral Movements Muscles of Facial Expression: None, normal Lips and Perioral Area: None, normal Jaw: None, normal Tongue: None, normal,Extremity Movements Upper (arms, wrists, hands, fingers): None, normal Lower (legs, knees, ankles, toes): None, normal, Trunk Movements Neck, shoulders, hips: None, normal, Overall Severity Severity of abnormal movements (highest score from questions above): None, normal Incapacitation due to abnormal movements: None, normal Patient's awareness of abnormal movements (rate only patient's report): No Awareness, Dental Status Current problems with teeth and/or dentures?: No Does patient usually wear dentures?: No  CIWA:    COWS:     Musculoskeletal: Strength & Muscle Tone: within normal limits Gait & Station: normal Patient leans: N/A  Psychiatric Specialty Exam:  Presentation  General Appearance:  Disheveled  Eye Contact: Minimal  Speech: Pressured  Speech Volume: Normal  Handedness: Right   Mood and Affect  Mood: Euphoric; Irritable  Affect: Inappropriate   Thought Process  Thought Processes: Disorganized  Descriptions of Associations:Loose  Orientation:Partial  Thought Content:Illogical;  Scattered  History of Schizophrenia/Schizoaffective disorder:No data recorded Duration of Psychotic Symptoms:No data recorded Hallucinations:No data recorded Ideas of Reference:Delusions; Paranoia  Suicidal Thoughts:No data recorded Homicidal Thoughts:No data recorded  Sensorium  Memory: Immediate Poor; Recent Poor; Remote Poor  Judgment: Poor  Insight: Poor   Executive Functions  Concentration: Fair  Attention Span: Fair  Recall: Poor  Fund of Knowledge: Poor  Language: Poor   Psychomotor Activity  Psychomotor Activity:No data recorded  Assets  Assets: Desire for Improvement; Social Support   Sleep  Sleep:No data recorded    Blood pressure 114/76, pulse 94, temperature 97.6 F (36.4 C),  temperature source Oral, resp. rate 18, height  (1.753 m), weight 96.4 kg, SpO2 98 %. Body mass index is 31.38 kg/m.   Treatment Plan Summary: Daily contact with patient to assess and evaluate symptoms and progress in treatment, Medication management, and Plan continue current medications.  Kerrilynn Derenzo Tresea Mall, DO 08/10/2022, 11:10 AM

## 2022-08-10 NOTE — BH IP Treatment Plan (Signed)
Interdisciplinary Treatment and Diagnostic Plan Update  08/10/2022 Time of Session: 9:35 am Travis Sandoval MRN: [id number]  Principal Diagnosis: Undifferentiated schizophrenia  Secondary Diagnoses: Principal Problem:   Undifferentiated schizophrenia   Current Medications:  Current Facility-Administered Medications  Medication Dose Route Frequency Provider Last Rate Last Admin   acetaminophen (TYLENOL) tablet 650 mg  650 mg Oral Q6H PRN Gabriel Cirri F, NP   650 mg at 08/02/22 0800   alum & mag hydroxide-simeth (MAALOX/MYLANTA) 200-200-20 MG/5ML suspension 30 mL  30 mL Oral Q4H PRN Gabriel Cirri F, NP   30 mL at 08/07/22 0923   ARIPiprazole ER (ABILIFY MAINTENA) injection 400 mg  400 mg Intramuscular Q28 days [name], [name], MD   400 mg at 07/25/22 1501   cloZAPine (CLOZARIL) tablet 300 mg  300 mg Oral QHS [name], [name], MD   300 mg at 08/09/22 2111   docusate sodium (COLACE) capsule 200 mg  200 mg Oral BID [name], [name], MD   200 mg at 07/27/22 0939   famotidine (PEPCID) tablet 20 mg  20 mg Oral BID [name], [name], MD   20 mg at 07/20/22 1740   hydrOXYzine (ATARAX) tablet 50 mg  50 mg Oral Q6H PRN [name], [name], MD   50 mg at 07/20/22 2102   magnesium citrate solution 1 Bottle  1 Bottle Oral Daily PRN Jaynie Bream, RPH   1 Bottle at 07/19/22 1643   magnesium hydroxide (MILK OF MAGNESIA) suspension 30 mL  30 mL Oral Daily PRN Jaynie Bream, RPH   30 mL at 07/18/22 1718   mineral oil liquid   Oral Daily PRN Sarina Ill, DO   Given at 07/17/22 1201   nicotine polacrilex (NICORETTE) gum 2 mg  2 mg Oral PRN [name], [name], MD   2 mg at 04/23/22 2200   polyethylene glycol (MIRALAX / GLYCOLAX) packet 17 g  17 g Oral Daily [name], [name], MD   17 g at 08/09/22 0930   senna-docusate (Senokot-S) tablet 2 tablet  2 tablet Oral Daily PRN [name], [name], MD   2 tablet at 07/14/22 0809   ziprasidone (GEODON) injection 20 mg  20 mg Intramuscular Q12H PRN Reggie Pile, MD       PTA Medications: Medications Prior to Admission  Medication Sig Dispense Refill Last Dose   ABILIFY MAINTENA 400 MG SRER injection Inject 400 mg into the muscle every 28 (twenty-eight) days.      ARIPiprazole (ABILIFY) 10 MG tablet Take 10 mg by mouth daily. (Patient not taking: Reported on 04/16/2022)      atomoxetine (STRATTERA) 40 MG capsule Take 40 mg by mouth daily. (Patient not taking: Reported on 04/16/2022)      atorvastatin (LIPITOR) 40 MG tablet Take 40 mg by mouth daily.      budesonide-formoterol (SYMBICORT) 160-4.5 MCG/ACT inhaler Inhale 2 puffs into the lungs.      cetirizine (ZYRTEC) 10 MG tablet Take 10 mg by mouth daily.      clozapine (CLOZARIL) 200 MG tablet Take 200 mg by mouth 2 (two) times daily. Take along with one 25 mg tablet for total 225 mg twice daily      cloZAPine (CLOZARIL) 25 MG tablet Take 25 mg by mouth 2 (two) times daily. Take along with one 200 mg tablet for total 225 mg twice daily      Dexlansoprazole (DEXILANT) 30 MG capsule Take 30 mg by mouth daily. (Patient not taking: Reported on 04/16/2022)  hydrOXYzine (VISTARIL) 50 MG capsule Take 50 mg by mouth 3 (three) times daily as needed for itching. (Patient not taking: Reported on 04/16/2022)      Melatonin 5 MG TABS Take 5 mg by mouth at bedtime as needed (sleep).      metoprolol succinate (TOPROL-XL) 25 MG 24 hr tablet Take 12.5 mg by mouth daily.      Phosphatidyl Choline 65 MG TABS Take 1 tablet by mouth daily.  (Patient not taking: Reported on 04/16/2022)      valproic acid (DEPAKENE) 250 MG/5ML solution Take 750-1,000 mLs by mouth 2 (two) times daily. 1000 mg (20 mL) every morning and 750 mg (15 mL) daily at bedtime       Patient Stressors: Other: Psychosis    Patient Strengths: Ability for insight  Active sense of humor  Average or above average intelligence  Capable of independent living  Supportive family/friends   Treatment Modalities: Medication Management, Group  therapy, Case management,  1 to 1 session with clinician, Psychoeducation, Recreational therapy.   Physician Treatment Plan for Primary Diagnosis: Undifferentiated schizophrenia Long Term Goal(s):     Short Term Goals: None, pt is a poor hx.  Medication Management: Evaluate patient's response, side effects, and tolerance of medication regimen.  Therapeutic Interventions: 1 to 1 sessions, Unit Group sessions and Medication administration.  Evaluation of Outcomes: Progressing  Physician Treatment Plan for Secondary Diagnosis: Principal Problem:   Undifferentiated schizophrenia  Long Term Goal(s):     Short Term Goals: None, pt is a poor hx.     Medication Management: Evaluate patient's response, side effects, and tolerance of medication regimen.  Therapeutic Interventions: 1 to 1 sessions, Unit Group sessions and Medication administration.  Evaluation of Outcomes: Progressing   RN Treatment Plan for Primary Diagnosis: Undifferentiated schizophrenia Long Term Goal(s): Knowledge of disease and therapeutic regimen to maintain health will improve  Short Term Goals: Ability to verbalize feelings will improve, Ability to identify and develop effective coping behaviors will improve, and Compliance with prescribed medications will improve  Medication Management: RN will administer medications as ordered by provider, will assess and evaluate patient's response and provide education to patient for prescribed medication. RN will report any adverse and/or side effects to prescribing provider.  Therapeutic Interventions: 1 on 1 counseling sessions, Psychoeducation, Medication administration, Evaluate responses to treatment, Monitor vital signs and CBGs as ordered, Perform/monitor CIWA, COWS, AIMS and Fall Risk screenings as ordered, Perform wound care treatments as ordered.  Evaluation of Outcomes: Progressing   LCSW Treatment Plan for Primary Diagnosis: Undifferentiated schizophrenia Long  Term Goal(s): Safe transition to appropriate next level of care at discharge, Engage patient in therapeutic group addressing interpersonal concerns.  Short Term Goals: Engage patient in aftercare planning with referrals and resources, Increase ability to appropriately verbalize feelings, Facilitate acceptance of mental health diagnosis and concerns, and Increase skills for wellness and recovery  Therapeutic Interventions: Assess for all discharge needs, 1 to 1 time with Social worker, Explore available resources and support systems, Assess for adequacy in community support network, Educate family and significant other(s) on suicide prevention, Complete Psychosocial Assessment, Interpersonal group therapy.  Evaluation of Outcomes: Progressing   Progress in Treatment: Attending groups: No. Participating in groups: No. Taking medication as prescribed: Yes. Toleration medication: Yes. Family/Significant other contact made: No, will contact:  Father Patient understands diagnosis: No. Discussing patient identified problems/goals with staff: No. Medical problems stabilized or resolved: Yes. Denies suicidal/homicidal ideation: Yes. Issues/concerns per patient self-inventory: No. Other: none  New  problem(s) identified: No, Describe:  none  New Short Term/Long Term Goal(s):  Patient Goals:    Discharge Plan or Barriers:   Reason for Continuation of Hospitalization: Hallucinations Medication stabilization  Estimated Length of Stay:  Last 3 [place] Suicide Severity Risk Score: Flowsheet Row Admission (Current) from 04/16/2022 in [facility] ED from 04/15/2022 in [facility] Emergency Department at [facility]  C-SSRS RISK CATEGORY No Risk No Risk       Last PHQ 2/9 Scores:     No data to display          Scribe for Treatment Team: Marshell Levan, LCSW 08/10/2022 9:35 AM

## 2022-08-10 NOTE — [facility] Group Notes (Signed)
[facility] Group Notes:  (Nursing/MHT/Case Management/Adjunct)  Date:  08/10/2022  Time:  2:10 PM  Type of Therapy:  Movement Therapy  Participation Level:  active  Participation Quality:  Appropriate  Affect:  Appropriate  Cognitive:  Appropriate  Insight:  Appropriate  Engagement in Group:  Engaged  Modes of Intervention:  Activity  Summary of Progress/Problems:  Rodena Goldmann 08/10/2022, 2:10 PM

## 2022-08-11 DIAGNOSIS — F200 Paranoid schizophrenia: Secondary | ICD-10-CM | POA: Diagnosis not present

## 2022-08-11 NOTE — Group Note (Signed)
LCSW Group Therapy Note   Group Date: 08/11/2022 Start Time: 1309 End Time: 1349   Type of Therapy and Topic:  Group Therapy: Problem Solving  Participation Level:  Did Not Attend   Summary of Patient Progress:  The patient did not attend group.       Marshell Levan, LCSWA 08/11/2022  2:04 PM

## 2022-08-11 NOTE — Plan of Care (Signed)
Pt denies SI / HI / AVH. Pt refuses scheduled medications. Pt is minimal during assessment. No signs of distress or injury. Pt is isolative to room. Staff will continue to monitor q15 for safety.    Problem: Education: Goal: Knowledge of General Education information will improve Description: Including pain rating scale, medication(s)/side effects and non-pharmacologic comfort measures Outcome: Not Progressing   Problem: Health Behavior/Discharge Planning: Goal: Ability to manage health-related needs will improve Outcome: Not Progressing   Problem: Clinical Measurements: Goal: Ability to maintain clinical measurements within normal limits will improve Outcome: Not Progressing

## 2022-08-11 NOTE — Progress Notes (Signed)
[facility] MD Progress Note  08/11/2022 12:00 PM Travis Sandoval  MRN:  [id number] Subjective: Travis Sandoval is seen on rounds.  No changes.  Nurses report no issues.  He denies any problems. Principal Problem: Undifferentiated schizophrenia Diagnosis: Principal Problem:   Undifferentiated schizophrenia  Total Time spent with patient: 15 minutes  Past Psychiatric History: Schizophrenia  Past Medical History:  Past Medical History:  Diagnosis Date   ADHD    Bipolar 1 disorder (HCC)    Hepatitis C    Schizoaffective disorder (HCC)    History reviewed. No pertinent surgical history. Family History:  Family History  Family history unknown: Yes   Family Psychiatric  History: Unremarkable Social History:  Social History   Substance and Sexual Activity  Alcohol Use Not Currently     Social History   Substance and Sexual Activity  Drug Use Not Currently    Social History   Socioeconomic History   Marital status: Unknown    Spouse name: Not on file   Number of children: Not on file   Years of education: Not on file   Highest education level: Not on file  Occupational History   Not on file  Tobacco Use   Smoking status: Every Day    Packs/day: 0.25    Years: 15.00    Additional pack years: 0.00    Total pack years: 3.75    Types: Cigarettes   Smokeless tobacco: Not on file  Vaping Use   Vaping Use: Not on file  Substance and Sexual Activity   Alcohol use: Not Currently   Drug use: Not Currently   Sexual activity: Not Currently  Other Topics Concern   Not on file  Social History Narrative   Not on file   Social Determinants of Health   Financial Resource Strain: Not on file  Food Insecurity: Patient Declined (05/18/2022)   Hunger Vital Sign    Worried About Running Out of Food in the Last Year: Patient declined    Ran Out of Food in the Last Year: Patient declined  Transportation Needs: Patient Declined (05/18/2022)   PRAPARE - Administrator, Civil Service  (Medical): Patient declined    Lack of Transportation (Non-Medical): Patient declined  Physical Activity: Not on file  Stress: Not on file  Social Connections: Not on file   Additional Social History:                         Sleep: Good  Appetite:  Good  Current Medications: Current Facility-Administered Medications  Medication Dose Route Frequency Provider Last Rate Last Admin   acetaminophen (TYLENOL) tablet 650 mg  650 mg Oral Q6H PRN Gabriel Cirri F, NP   650 mg at 08/02/22 0800   alum & mag hydroxide-simeth (MAALOX/MYLANTA) 200-200-20 MG/5ML suspension 30 mL  30 mL Oral Q4H PRN Gabriel Cirri F, NP   30 mL at 08/07/22 0923   ARIPiprazole ER (ABILIFY MAINTENA) injection 400 mg  400 mg Intramuscular Q28 days [name], [name], MD   400 mg at 07/25/22 1501   cloZAPine (CLOZARIL) tablet 300 mg  300 mg Oral QHS [name], [name], MD   300 mg at 08/10/22 2143   docusate sodium (COLACE) capsule 200 mg  200 mg Oral BID [name], [name], MD   200 mg at 07/27/22 0939   famotidine (PEPCID) tablet 20 mg  20 mg Oral BID [name], [name], MD   20 mg at 07/20/22 1740  hydrOXYzine (ATARAX) tablet 50 mg  50 mg Oral Q6H PRN [name], [name], MD   50 mg at 08/10/22 2143   magnesium citrate solution 1 Bottle  1 Bottle Oral Daily PRN Jaynie Bream, RPH   1 Bottle at 07/19/22 1643   magnesium hydroxide (MILK OF MAGNESIA) suspension 30 mL  30 mL Oral Daily PRN Jaynie Bream, RPH   30 mL at 07/18/22 1718   mineral oil liquid   Oral Daily PRN Sarina Ill, DO   Given at 07/17/22 1201   nicotine polacrilex (NICORETTE) gum 2 mg  2 mg Oral PRN [name], [name], MD   2 mg at 04/23/22 2200   polyethylene glycol (MIRALAX / GLYCOLAX) packet 17 g  17 g Oral Daily [name], [name], MD   17 g at 08/11/22 0831   senna-docusate (Senokot-S) tablet 2 tablet  2 tablet Oral Daily PRN [name], [name], MD   2 tablet at 07/14/22 0809   ziprasidone (GEODON) injection 20 mg  20 mg Intramuscular Q12H  PRN Reggie Pile, MD        Lab Results: No results found for this or any previous visit (from the past 48 hour(s)).  Blood Alcohol level:  Lab Results  Component Value Date   ETH <10 04/15/2022    Metabolic Disorder Labs: Lab Results  Component Value Date   HGBA1C 5.5 04/24/2022   MPG 111 04/24/2022   No results found for: "PROLACTIN" Lab Results  Component Value Date   CHOL 220 (H) 04/24/2022   TRIG 700 (H) 04/24/2022   HDL 35 (L) 04/24/2022   CHOLHDL 6.3 04/24/2022   VLDL UNABLE TO CALCULATE IF TRIGLYCERIDE OVER 400 mg/dL 16/01/9603   LDLCALC UNABLE TO CALCULATE IF TRIGLYCERIDE OVER 400 mg/dL 54/12/8117    Physical Findings: AIMS: Facial and Oral Movements Muscles of Facial Expression: None, normal Lips and Perioral Area: None, normal Jaw: None, normal Tongue: None, normal,Extremity Movements Upper (arms, wrists, hands, fingers): None, normal Lower (legs, knees, ankles, toes): None, normal, Trunk Movements Neck, shoulders, hips: None, normal, Overall Severity Severity of abnormal movements (highest score from questions above): None, normal Incapacitation due to abnormal movements: None, normal Patient's awareness of abnormal movements (rate only patient's report): No Awareness, Dental Status Current problems with teeth and/or dentures?: No Does patient usually wear dentures?: No  CIWA:    COWS:     Musculoskeletal: Strength & Muscle Tone: within normal limits Gait & Station: normal Patient leans: N/A  Psychiatric Specialty Exam:  Presentation  General Appearance:  Disheveled  Eye Contact: Minimal  Speech: Pressured  Speech Volume: Normal  Handedness: Right   Mood and Affect  Mood: Euphoric; Irritable  Affect: Inappropriate   Thought Process  Thought Processes: Disorganized  Descriptions of Associations:Loose  Orientation:Partial  Thought Content:Illogical; Scattered  History of Schizophrenia/Schizoaffective disorder:No data  recorded Duration of Psychotic Symptoms:No data recorded Hallucinations:No data recorded Ideas of Reference:Delusions; Paranoia  Suicidal Thoughts:No data recorded Homicidal Thoughts:No data recorded  Sensorium  Memory: Immediate Poor; Recent Poor; Remote Poor  Judgment: Poor  Insight: Poor   Executive Functions  Concentration: Fair  Attention Span: Fair  Recall: Poor  Fund of Knowledge: Poor  Language: Poor   Psychomotor Activity  Psychomotor Activity:No data recorded  Assets  Assets: Desire for Improvement; Social Support   Sleep  Sleep:No data recorded    Blood pressure 111/84, pulse (!) 109, temperature 98 F (36.7 C), temperature source Oral, resp. rate 18, height 5\' 9"  (1.753 m), weight  96.4 kg, SpO2 98 %. Body mass index is 31.38 kg/m.   Treatment Plan Summary: Daily contact with patient to assess and evaluate symptoms and progress in treatment, Medication management, and Plan continue current medications.  Travis Sandoval Tresea Mall, DO 08/11/2022, 12:00 PM

## 2022-08-11 NOTE — Progress Notes (Signed)
   08/10/22 2000  Psych Admission Type (Psych Patients Only)  Admission Status Voluntary  Psychosocial Assessment  Patient Complaints None  Eye Contact Fair  Facial Expression Flat  Affect Appropriate to circumstance  Speech Logical/coherent  Interaction Assertive;Isolative  Motor Activity Slow  Appearance/Hygiene In scrubs;Improved  Behavior Characteristics Cooperative;Appropriate to situation  Mood Pleasant  Aggressive Behavior  Effect No apparent injury  Thought Process  Coherency Circumstantial  Content WDL  Delusions None reported or observed  Perception WDL  Hallucination None reported or observed  Judgment WDL  Confusion None  Danger to Self  Current suicidal ideation? Denies  Agreement Not to Harm Self Yes  Description of Agreement verbal  Danger to Others  Danger to Others None reported or observed

## 2022-08-12 DIAGNOSIS — F203 Undifferentiated schizophrenia: Secondary | ICD-10-CM | POA: Diagnosis not present

## 2022-08-12 NOTE — Group Note (Signed)
Date:  08/12/2022 Time:  4:35 PM  Group Topic/Focus:  Activity Group    Participation Level:  Minimal  Participation Quality:  Appropriate  Affect:  Appropriate  Cognitive:  Appropriate  Insight: Appropriate  Engagement in Group:  Engaged and Limited  Modes of Intervention:  Activity  Additional Comments:    Wilford Corner 08/12/2022, 4:35 PM

## 2022-08-12 NOTE — Group Note (Signed)
Recreation Therapy Group Note   Group Topic:Goal Setting  Group Date: 08/12/2022 Start Time: 1015 End Time: 1130 Facilitators: Clinton Gallant, CTRS Location: Craft Room  Group Description: Vision Board. Patients were given many different magazines, a glue stick, markers, and a piece of cardstock paper. LRT and pts discussed the importance of having goals in life. LRT and pts discussed the difference between short-term and long-term goals, as well as what a SMART goal is. LRT encouraged pts to create a vision board, with images they picked and then cut out by LRT from the magazine, for themselves, that capture their short and long-term goals. On the back of the paper, pt encouraged to write 3 different coping skills that can help them reach those goals. LRT encouraged pts to show and explain their vision board to the group. LRT offered to laminate vision board once dry and complete.   Goal Area(s) Addressed:  Patient will gain knowledge of short vs. long term goals.  Patient will identify goals for themselves. Patient will practice setting SMART goals.  Affect/Mood: N/A   Participation Level: Did not attend    Clinical Observations/Individualized Feedback: Travis Sandoval did not attend group due to resting in his room.  Plan: Continue to engage patient in RT group sessions 2-3x/week.   Rosina Lowenstein, LRT, CTRS 08/12/2022 12:25 PM

## 2022-08-12 NOTE — Group Note (Signed)
Date:  08/12/2022 Time:  11:33 PM  Group Topic/Focus:  Wrap-Up Group:   The focus of this group is to help patients review their daily goal of treatment and discuss progress on daily workbooks.    Participation Level:  Minimal  Participation Quality:  Appropriate and Attentive  Affect:  Appropriate and Flat  Cognitive:  Appropriate  Insight: Appropriate  Engagement in Group:  Improving  Modes of Intervention:  Activity  Additional Comments:     Maglione,Collins Kerby E 08/12/2022, 11:33 PM

## 2022-08-12 NOTE — Group Note (Signed)
[name] LCSW Group Therapy Note    Group Date: 08/12/2022 Start Time: 1315 End Time: 1409  Type of Therapy and Topic:  Group Therapy:  Overcoming Obstacles  Participation Level:  [facility] PARTICIPATION LEVEL: Did Not Attend  Mood:  Description of Group:   In this group patients will be encouraged to explore what they see as obstacles to their own wellness and recovery. They will be guided to discuss their thoughts, feelings, and behaviors related to these obstacles. The group will process together ways to cope with barriers, with attention given to specific choices patients can make. Each patient will be challenged to identify changes they are motivated to make in order to overcome their obstacles. This group will be process-oriented, with patients participating in exploration of their own experiences as well as giving and receiving support and challenge from other group members.  Therapeutic Goals: 1. Patient will identify personal and current obstacles as they relate to admission. 2. Patient will identify barriers that currently interfere with their wellness or overcoming obstacles.  3. Patient will identify feelings, thought process and behaviors related to these barriers. 4. Patient will identify two changes they are willing to make to overcome these obstacles:    Summary of Patient Progress X   Therapeutic Modalities:   Cognitive Behavioral Therapy Solution Focused Therapy Motivational Interviewing Relapse Prevention Therapy   Glenis Smoker, LCSW

## 2022-08-12 NOTE — Plan of Care (Signed)
D- Patient alert and oriented. Patient presented in a pleasant mood on assessment reporting that he slept "good" last night and had no complaints to voice to this Clinical research associate. Patient denied SI, HI, AVH, and pain at this time. Patient also denied any signs/symptoms of depression and anxiety, stating that overall he is "feeling good". Patient's goal for today is "release".   A- Some of scheduled medications administered to patient, per MD orders. Patient continues to only want Miralax and nothing else. Support and encouragement provided. Routine safety checks conducted every 15 minutes. Patient informed to notify staff with problems or concerns.   R- No adverse drug reactions noted. Patient contracts for safety at this time. Patient compliant with some medications. Patient receptive, calm, and cooperative. Patient isolates to room, except for meals and to take certain medications. Patient remains safe at this time.  Problem: Education: Goal: Knowledge of General Education information will improve Description: Including pain rating scale, medication(s)/side effects and non-pharmacologic comfort measures Outcome: Progressing   Problem: Health Behavior/Discharge Planning: Goal: Ability to manage health-related needs will improve Outcome: Progressing   Problem: Clinical Measurements: Goal: Ability to maintain clinical measurements within normal limits will improve Outcome: Progressing Goal: Will remain free from infection Outcome: Progressing Goal: Diagnostic test results will improve Outcome: Progressing Goal: Respiratory complications will improve Outcome: Progressing Goal: Cardiovascular complication will be avoided Outcome: Progressing   Problem: Activity: Goal: Risk for activity intolerance will decrease Outcome: Progressing   Problem: Nutrition: Goal: Adequate nutrition will be maintained Outcome: Progressing   Problem: Coping: Goal: Level of anxiety will decrease Outcome:  Progressing   Problem: Elimination: Goal: Will not experience complications related to urinary retention Outcome: Progressing   Problem: Pain Managment: Goal: General experience of comfort will improve Outcome: Progressing   Problem: Safety: Goal: Ability to remain free from injury will improve Outcome: Progressing   Problem: Skin Integrity: Goal: Risk for impaired skin integrity will decrease Outcome: Progressing   Problem: Education: Goal: Knowledge of [facility] Education information/materials will improve Outcome: Progressing Goal: Emotional status will improve Outcome: Progressing Goal: Mental status will improve Outcome: Progressing Goal: Verbalization of understanding the information provided will improve Outcome: Progressing   Problem: Activity: Goal: Interest or engagement in activities will improve Outcome: Progressing Goal: Sleeping patterns will improve Outcome: Progressing   Problem: Coping: Goal: Ability to verbalize frustrations and anger appropriately will improve Outcome: Progressing Goal: Ability to demonstrate self-control will improve Outcome: Progressing   Problem: Health Behavior/Discharge Planning: Goal: Identification of resources available to assist in meeting health care needs will improve Outcome: Progressing Goal: Compliance with treatment plan for underlying cause of condition will improve Outcome: Progressing   Problem: Physical Regulation: Goal: Ability to maintain clinical measurements within normal limits will improve Outcome: Progressing   Problem: Safety: Goal: Periods of time without injury will increase Outcome: Progressing   Problem: Activity: Goal: Will verbalize the importance of balancing activity with adequate rest periods Outcome: Progressing   Problem: Education: Goal: Will be free of psychotic symptoms Outcome: Progressing Goal: Knowledge of the prescribed therapeutic regimen will improve Outcome:  Progressing   Problem: Coping: Goal: Coping ability will improve Outcome: Progressing Goal: Will verbalize feelings Outcome: Progressing   Problem: Health Behavior/Discharge Planning: Goal: Compliance with prescribed medication regimen will improve Outcome: Progressing   Problem: Nutritional: Goal: Ability to achieve adequate nutritional intake will improve Outcome: Progressing   Problem: Role Relationship: Goal: Ability to communicate needs accurately will improve Outcome: Progressing Goal: Ability to interact with others will  improve Outcome: Progressing   Problem: Safety: Goal: Ability to redirect hostility and anger into socially appropriate behaviors will improve Outcome: Progressing Goal: Ability to remain free from injury will improve Outcome: Progressing   Problem: Self-Care: Goal: Ability to participate in self-care as condition permits will improve Outcome: Progressing   Problem: Self-Concept: Goal: Will verbalize positive feelings about self Outcome: Progressing

## 2022-08-12 NOTE — Progress Notes (Signed)
[facility] MD Progress Note  08/12/2022 4:30 PM Travis Sandoval  MRN:  [id number] Subjective: Patient seen and chart reviewed.  No change.  Spends pretty much all of his time isolated to his room but when you talk with him has no complaints does not come across as bizarre.  He does come out and watch TV occasionally and is eating normally and compliant with medicine. Principal Problem: Undifferentiated schizophrenia Diagnosis: Principal Problem:   Undifferentiated schizophrenia  Total Time spent with patient: 30 minutes  Past Psychiatric History: Past history of schizophrenia with lengthy hospitalization  Past Medical History:  Past Medical History:  Diagnosis Date   ADHD    Bipolar 1 disorder (HCC)    Hepatitis C    Schizoaffective disorder (HCC)    History reviewed. No pertinent surgical history. Family History:  Family History  Family history unknown: Yes   Family Psychiatric  History: See previous Social History:  Social History   Substance and Sexual Activity  Alcohol Use Not Currently     Social History   Substance and Sexual Activity  Drug Use Not Currently    Social History   Socioeconomic History   Marital status: Unknown    Spouse name: Not on file   Number of children: Not on file   Years of education: Not on file   Highest education level: Not on file  Occupational History   Not on file  Tobacco Use   Smoking status: Every Day    Packs/day: 0.25    Years: 15.00    Additional pack years: 0.00    Total pack years: 3.75    Types: Cigarettes   Smokeless tobacco: Not on file  Vaping Use   Vaping Use: Not on file  Substance and Sexual Activity   Alcohol use: Not Currently   Drug use: Not Currently   Sexual activity: Not Currently  Other Topics Concern   Not on file  Social History Narrative   Not on file   Social Determinants of Health   Financial Resource Strain: Not on file  Food Insecurity: Patient Declined (05/18/2022)   Hunger Vital Sign     Worried About Running Out of Food in the Last Year: Patient declined    Ran Out of Food in the Last Year: Patient declined  Transportation Needs: Patient Declined (05/18/2022)   PRAPARE - Administrator, Civil Service (Medical): Patient declined    Lack of Transportation (Non-Medical): Patient declined  Physical Activity: Not on file  Stress: Not on file  Social Connections: Not on file   Additional Social History:                         Sleep: Fair  Appetite:  Fair  Current Medications: Current Facility-Administered Medications  Medication Dose Route Frequency Provider Last Rate Last Admin   acetaminophen (TYLENOL) tablet 650 mg  650 mg Oral Q6H PRN Gabriel Cirri F, NP   650 mg at 08/02/22 0800   alum & mag hydroxide-simeth (MAALOX/MYLANTA) 200-200-20 MG/5ML suspension 30 mL  30 mL Oral Q4H PRN Gabriel Cirri F, NP   30 mL at 08/11/22 2146   ARIPiprazole ER (ABILIFY MAINTENA) injection 400 mg  400 mg Intramuscular Q28 days Nima Kemppainen T, MD   400 mg at 07/25/22 1501   cloZAPine (CLOZARIL) tablet 300 mg  300 mg Oral QHS Maleigha Colvard T, MD   300 mg at 08/11/22 2146   docusate sodium (COLACE) capsule 200 mg  200 mg Oral BID Draysen Weygandt T, MD   200 mg at 07/27/22 0939   famotidine (PEPCID) tablet 20 mg  20 mg Oral BID Kempton Milne T, MD   20 mg at 07/20/22 1740   hydrOXYzine (ATARAX) tablet 50 mg  50 mg Oral Q6H PRN Korissa Horsford, [name], MD   50 mg at 08/10/22 2143   magnesium citrate solution 1 Bottle  1 Bottle Oral Daily PRN Jaynie Bream, RPH   1 Bottle at 07/19/22 1643   magnesium hydroxide (MILK OF MAGNESIA) suspension 30 mL  30 mL Oral Daily PRN Jaynie Bream, RPH   30 mL at 07/18/22 1718   mineral oil liquid   Oral Daily PRN Sarina Ill, DO   Given at 07/17/22 1201   nicotine polacrilex (NICORETTE) gum 2 mg  2 mg Oral PRN Elidia Bonenfant, [name], MD   2 mg at 04/23/22 2200   polyethylene glycol (MIRALAX / GLYCOLAX) packet 17 g  17 g Oral  Daily Telisa Ohlsen, [name], MD   17 g at 08/12/22 0831   senna-docusate (Senokot-S) tablet 2 tablet  2 tablet Oral Daily PRN Nekhi Liwanag, [name], MD   2 tablet at 07/14/22 0809   ziprasidone (GEODON) injection 20 mg  20 mg Intramuscular Q12H PRN Reggie Pile, MD        Lab Results: No results found for this or any previous visit (from the past 48 hour(s)).  Blood Alcohol level:  Lab Results  Component Value Date   ETH <10 04/15/2022    Metabolic Disorder Labs: Lab Results  Component Value Date   HGBA1C 5.5 04/24/2022   MPG 111 04/24/2022   No results found for: "PROLACTIN" Lab Results  Component Value Date   CHOL 220 (H) 04/24/2022   TRIG 700 (H) 04/24/2022   HDL 35 (L) 04/24/2022   CHOLHDL 6.3 04/24/2022   VLDL UNABLE TO CALCULATE IF TRIGLYCERIDE OVER 400 mg/dL 16/01/9603   LDLCALC UNABLE TO CALCULATE IF TRIGLYCERIDE OVER 400 mg/dL 54/12/8117    Physical Findings: AIMS: Facial and Oral Movements Muscles of Facial Expression: None, normal Lips and Perioral Area: None, normal Jaw: None, normal Tongue: None, normal,Extremity Movements Upper (arms, wrists, hands, fingers): None, normal Lower (legs, knees, ankles, toes): None, normal, Trunk Movements Neck, shoulders, hips: None, normal, Overall Severity Severity of abnormal movements (highest score from questions above): None, normal Incapacitation due to abnormal movements: None, normal Patient's awareness of abnormal movements (rate only patient's report): No Awareness, Dental Status Current problems with teeth and/or dentures?: No Does patient usually wear dentures?: No  CIWA:    COWS:     Musculoskeletal: Strength & Muscle Tone: within normal limits Gait & Station: normal Patient leans: N/A  Psychiatric Specialty Exam:  Presentation  General Appearance:  Disheveled  Eye Contact: Minimal  Speech: Pressured  Speech Volume: Normal  Handedness: Right   Mood and Affect  Mood: Euphoric;  Irritable  Affect: Inappropriate   Thought Process  Thought Processes: Disorganized  Descriptions of Associations:Loose  Orientation:Partial  Thought Content:Illogical; Scattered  History of Schizophrenia/Schizoaffective disorder:No data recorded Duration of Psychotic Symptoms:No data recorded Hallucinations:No data recorded Ideas of Reference:Delusions; Paranoia  Suicidal Thoughts:No data recorded Homicidal Thoughts:No data recorded  Sensorium  Memory: Immediate Poor; Recent Poor; Remote Poor  Judgment: Poor  Insight: Poor   Executive Functions  Concentration: Fair  Attention Span: Fair  Recall: Poor  Fund of Knowledge: Poor  Language: Poor   Psychomotor Activity  Psychomotor Activity:No data recorded  Assets  Assets: Desire for Improvement; Social Support   Sleep  Sleep:No data recorded   Physical Exam: Physical Exam Vitals and nursing note reviewed.  Constitutional:      Appearance: Normal appearance.  HENT:     Head: Normocephalic and atraumatic.     Mouth/Throat:     Pharynx: Oropharynx is clear.  Eyes:     Pupils: Pupils are equal, round, and reactive to light.  Cardiovascular:     Rate and Rhythm: Normal rate and regular rhythm.  Pulmonary:     Effort: Pulmonary effort is normal.     Breath sounds: Normal breath sounds.  Abdominal:     General: Abdomen is flat.     Palpations: Abdomen is soft.  Musculoskeletal:        General: Normal range of motion.  Skin:    General: Skin is warm and dry.  Neurological:     General: No focal deficit present.     Mental Status: He is alert. Mental status is at baseline.  Psychiatric:        Attention and Perception: Attention normal.        Mood and Affect: Mood normal. Affect is blunt.        Speech: Speech is delayed.        Behavior: Behavior is slowed.        Thought Content: Thought content normal.    Review of Systems  Constitutional: Negative.   HENT: Negative.     Eyes: Negative.   Respiratory: Negative.    Cardiovascular: Negative.   Gastrointestinal: Negative.   Musculoskeletal: Negative.   Skin: Negative.   Neurological: Negative.   Psychiatric/Behavioral: Negative.     Blood pressure 110/74, pulse (!) 106, temperature 98.6 F (37 C), temperature source Oral, resp. rate 18, height  (1.753 m), weight 96.4 kg, SpO2 97 %. Body mass index is 31.38 kg/m.   Treatment Plan Summary: Medication management and Plan stable.  No need to change medication.  Entirely waiting on placement decisions  Mordecai Rasmussen, MD 08/12/2022, 4:30 PM

## 2022-08-12 NOTE — Progress Notes (Signed)
[facility] LCSW Progress Note   08/12/2022    10:42 AM  Travis Sandoval   [id number]   Type of Contact and Topic:  Care Coordination   CSW attempted to reach [name], DSS guardian, in order to get update on payee application with Social Security. Unable to reach guardian, Left HIPAA compliant voicemail with contact information and callback request.     Signed:  Corky Crafts, MSW, LCSW, LCAS 08/12/2022 10:42 AM

## 2022-08-12 NOTE — Group Note (Signed)
Date:  08/12/2022 Time:  10:11 AM  Group Topic/Focus:  Goals Group:   The focus of this group is to help patients establish daily goals to achieve during treatment and discuss how the patient can incorporate goal setting into their daily lives to aide in recovery.  Community Meeting    Participation Level:  Did Not Attend   Lynelle Smoke [facility] 08/12/2022, 10:11 AM

## 2022-08-13 NOTE — Group Note (Signed)
Recreation Therapy Group Note   Group Topic:Healthy Support Systems  Group Date: 08/13/2022 Start Time: 1000 End Time: 1110 Facilitators: Rosina Lowenstein, LRT, CTRS Location:  Craft Room  Group Description: Straw Bridge. Individually, patients were given 10 plastic drinking straws and an equal length of masking tape. Using the materials provided, patients were instructed to build a free-standing bridge-like structure to suspend an everyday item (ex: puzzle box) off the floor or table surface. All materials were required to be used in Secondary school teacher. LRT facilitated post-activity discussion reviewing how we, humans, are like the structure we built; when things get too heavy in our life and we do not have adequate supports/coping skills, then we will fall just like the straw-built structure will. LRT focused on how having a "base" or structure on the bottom was necessary for the object to stand, meaning we must be secure and stable first before building on ourselves or others. Patients were encouraged to name 2 healthy supports in their life and reflect on how the skills used in this activity can be generalized to daily life post discharge.  Goal Area(s) Addressed:  Patient will identify two healthy support systems in their life. Patient will work on Product manager. Patient will verbalize the importance of having a strong and steady "base".  Patient will follow multi-step directions. Patients will engage in creativity and use all provided materials.  Affect/Mood: N/A   Participation Level: Did not attend    Clinical Observations/Individualized Feedback: Alexei did not attend group due to resting in his room.  Plan: Continue to engage patient in RT group sessions 2-3x/week.   Rosina Lowenstein, LRT, CTRS 08/13/2022 11:58 AM

## 2022-08-13 NOTE — Group Note (Signed)
Date:  08/13/2022 Time:  10:08 AM  Group Topic/Focus:  Goals Group:   The focus of this group is to help patients establish daily goals to achieve during treatment and discuss how the patient can incorporate goal setting into their daily lives to aide in recovery.  Community Meeting   Participation Level:  Did Not Attend   Lynelle Smoke [facility] 08/13/2022, 10:08 AM

## 2022-08-13 NOTE — Group Note (Signed)
Date:  08/13/2022 Time:  4:52 PM  Group Topic/Focus:  Activity Group    Participation Level:  Minimal  Participation Quality:  Appropriate  Affect:  Appropriate  Cognitive:  Appropriate  Insight: Appropriate  Engagement in Group:  Limited  Modes of Intervention:  Activity  Additional Comments:    Wilford Corner 08/13/2022, 4:52 PM

## 2022-08-13 NOTE — Progress Notes (Signed)
Patient calm and pleasant during assessment denying SI/HI/AVH. Pt compliant with medication administration per MD orders. Pt given education, support, and encouragement to be active in his treatment plan. Pt being monitored Q 15 minutes for safety per unit protocol, remains safe on the unit  

## 2022-08-13 NOTE — Group Note (Signed)
LCSW Group Therapy Note  Group Date: 08/13/2022 Start Time: 1300 End Time: 1400   Type of Therapy and Topic:  Group Therapy - Healthy vs Unhealthy Coping Skills  Participation Level:  Did Not Attend   Description of Group The focus of this group was to determine what unhealthy coping techniques typically are used by group members and what healthy coping techniques would be helpful in coping with various problems. Patients were guided in becoming aware of the differences between healthy and unhealthy coping techniques. Patients were asked to identify 2-3 healthy coping skills they would like to learn to use more effectively.  Therapeutic Goals Patients learned that coping is what human beings do all day long to deal with various situations in their lives Patients defined and discussed healthy vs unhealthy coping techniques Patients identified their preferred coping techniques and identified whether these were healthy or unhealthy Patients determined 2-3 healthy coping skills they would like to become more familiar with and use more often. Patients provided support and ideas to each other   Summary of Patient Progress:   Patient did not attend group despite encouraged participation.    Therapeutic Modalities Cognitive Behavioral Therapy Motivational Interviewing  Almedia Balls 08/13/2022  2:07 PM

## 2022-08-13 NOTE — Progress Notes (Signed)
Patient is pleasant, alert, and oriented. He denied SI, Hi, delusions, and hallucinations.  No s/sx. of acute distress.   He is able to voice his complaints and/or concerns.  He denied pain / discomfort.  No adverse reaction to medications.  No AIMS observed by this Clinical research associate.  He remained in his room is his bed throughout the shift.  Respirations even / unlabored.  Plan of care in place.  Q9m safety checks completed.

## 2022-08-13 NOTE — Progress Notes (Signed)
D- Patient alert and oriented x 3-4. Affect flat/mood euthymic. Denies SI/ HI/ AVH. Patient denies pain. Patient denies depression and anxiety. His goal of the day is to "get laundry done and go outside". A- Scheduled medications administered to patient, per MD orders. Support and encouragement provided.  Routine safety checks conducted every 15 minutes without incident.  Patient informed to notify staff with problems or concerns and verbalizes understanding. R- No adverse drug reactions noted.  Patient compliant with medications and treatment plan. Patient receptive, calm, cooperative and  isolates to room except today he was out of the room requesting to do laundry and he participated in  outdoor activity. He did do laundry and did participate in outdoor activities today. Patient contracts for safety and  remains safe on the unit at this time.

## 2022-08-13 NOTE — Progress Notes (Signed)
[facility] MD Progress Note  08/13/2022 4:48 PM Travis Sandoval  MRN:  [id number] Subjective: Patient seen.  No new complaints.  Behavior unchanged physical presentation unchanged Principal Problem: Undifferentiated schizophrenia Diagnosis: Principal Problem:   Undifferentiated schizophrenia  Total Time spent with patient: 15 minutes  Past Psychiatric History: See previous  Past Medical History:  Past Medical History:  Diagnosis Date   ADHD    Bipolar 1 disorder (HCC)    Hepatitis C    Schizoaffective disorder (HCC)    History reviewed. No pertinent surgical history. Family History:  Family History  Family history unknown: Yes   Family Psychiatric  History: See previous Social History:  Social History   Substance and Sexual Activity  Alcohol Use Not Currently     Social History   Substance and Sexual Activity  Drug Use Not Currently    Social History   Socioeconomic History   Marital status: Unknown    Spouse name: Not on file   Number of children: Not on file   Years of education: Not on file   Highest education level: Not on file  Occupational History   Not on file  Tobacco Use   Smoking status: Every Day    Packs/day: 0.25    Years: 15.00    Additional pack years: 0.00    Total pack years: 3.75    Types: Cigarettes   Smokeless tobacco: Not on file  Vaping Use   Vaping Use: Not on file  Substance and Sexual Activity   Alcohol use: Not Currently   Drug use: Not Currently   Sexual activity: Not Currently  Other Topics Concern   Not on file  Social History Narrative   Not on file   Social Determinants of Health   Financial Resource Strain: Not on file  Food Insecurity: Patient Declined (05/18/2022)   Hunger Vital Sign    Worried About Running Out of Food in the Last Year: Patient declined    Ran Out of Food in the Last Year: Patient declined  Transportation Needs: Patient Declined (05/18/2022)   PRAPARE - Administrator, Civil Service  (Medical): Patient declined    Lack of Transportation (Non-Medical): Patient declined  Physical Activity: Not on file  Stress: Not on file  Social Connections: Not on file   Additional Social History:                         Sleep: Fair  Appetite:  Fair  Current Medications: Current Facility-Administered Medications  Medication Dose Route Frequency Provider Last Rate Last Admin   acetaminophen (TYLENOL) tablet 650 mg  650 mg Oral Q6H PRN Gabriel Cirri F, NP   650 mg at 08/02/22 0800   alum & mag hydroxide-simeth (MAALOX/MYLANTA) 200-200-20 MG/5ML suspension 30 mL  30 mL Oral Q4H PRN Gabriel Cirri F, NP   30 mL at 08/11/22 2146   ARIPiprazole ER (ABILIFY MAINTENA) injection 400 mg  400 mg Intramuscular Q28 days Brina Umeda T, MD   400 mg at 07/25/22 1501   cloZAPine (CLOZARIL) tablet 300 mg  300 mg Oral QHS Savier Trickett T, MD   300 mg at 08/11/22 2146   docusate sodium (COLACE) capsule 200 mg  200 mg Oral BID Karie Skowron T, MD   200 mg at 07/27/22 0939   famotidine (PEPCID) tablet 20 mg  20 mg Oral BID Anju Sereno T, MD   20 mg at 07/20/22 1740   hydrOXYzine (ATARAX) tablet 50  mg  50 mg Oral Q6H PRN Ysabel Stankovich, [name], MD   50 mg at 08/10/22 2143   magnesium citrate solution 1 Bottle  1 Bottle Oral Daily PRN Jaynie Bream, RPH   1 Bottle at 07/19/22 1643   magnesium hydroxide (MILK OF MAGNESIA) suspension 30 mL  30 mL Oral Daily PRN Jaynie Bream, RPH   30 mL at 07/18/22 1718   mineral oil liquid   Oral Daily PRN Sarina Ill, DO   Given at 07/17/22 1201   nicotine polacrilex (NICORETTE) gum 2 mg  2 mg Oral PRN Joy Reiger, [name], MD   2 mg at 04/23/22 2200   polyethylene glycol (MIRALAX / GLYCOLAX) packet 17 g  17 g Oral Daily Luiscarlos Kaczmarczyk, [name], MD   17 g at 08/13/22 0850   senna-docusate (Senokot-S) tablet 2 tablet  2 tablet Oral Daily PRN [employer], [name], MD   2 tablet at 07/14/22 0809   ziprasidone (GEODON) injection 20 mg  20 mg Intramuscular Q12H  PRN Reggie Pile, MD        Lab Results: No results found for this or any previous visit (from the past 48 hour(s)).  Blood Alcohol level:  Lab Results  Component Value Date   ETH <10 04/15/2022    Metabolic Disorder Labs: Lab Results  Component Value Date   HGBA1C 5.5 04/24/2022   MPG 111 04/24/2022   No results found for: "PROLACTIN" Lab Results  Component Value Date   CHOL 220 (H) 04/24/2022   TRIG 700 (H) 04/24/2022   HDL 35 (L) 04/24/2022   CHOLHDL 6.3 04/24/2022   VLDL UNABLE TO CALCULATE IF TRIGLYCERIDE OVER 400 mg/dL 96/07/5407   LDLCALC UNABLE TO CALCULATE IF TRIGLYCERIDE OVER 400 mg/dL 81/19/1478    Physical Findings: AIMS: Facial and Oral Movements Muscles of Facial Expression: None, normal Lips and Perioral Area: None, normal Jaw: None, normal Tongue: None, normal,Extremity Movements Upper (arms, wrists, hands, fingers): None, normal Lower (legs, knees, ankles, toes): None, normal, Trunk Movements Neck, shoulders, hips: None, normal, Overall Severity Severity of abnormal movements (highest score from questions above): None, normal Incapacitation due to abnormal movements: None, normal Patient's awareness of abnormal movements (rate only patient's report): No Awareness, Dental Status Current problems with teeth and/or dentures?: No Does patient usually wear dentures?: No  CIWA:    COWS:     Musculoskeletal: Strength & Muscle Tone: within normal limits Gait & Station: normal Patient leans: N/A  Psychiatric Specialty Exam:  Presentation  General Appearance:  Disheveled  Eye Contact: Minimal  Speech: Pressured  Speech Volume: Normal  Handedness: Right   Mood and Affect  Mood: Euphoric; Irritable  Affect: Inappropriate   Thought Process  Thought Processes: Disorganized  Descriptions of Associations:Loose  Orientation:Partial  Thought Content:Illogical; Scattered  History of Schizophrenia/Schizoaffective disorder:No data  recorded Duration of Psychotic Symptoms:No data recorded Hallucinations:No data recorded Ideas of Reference:Delusions; Paranoia  Suicidal Thoughts:No data recorded Homicidal Thoughts:No data recorded  Sensorium  Memory: Immediate Poor; Recent Poor; Remote Poor  Judgment: Poor  Insight: Poor   Executive Functions  Concentration: Fair  Attention Span: Fair  Recall: Poor  Fund of Knowledge: Poor  Language: Poor   Psychomotor Activity  Psychomotor Activity:No data recorded  Assets  Assets: Desire for Improvement; Social Support   Sleep  Sleep:No data recorded   Physical Exam: Physical Exam Vitals and nursing note reviewed.  Constitutional:      Appearance: Normal appearance.  HENT:     Head: Normocephalic and  atraumatic.     Mouth/Throat:     Pharynx: Oropharynx is clear.  Eyes:     Pupils: Pupils are equal, round, and reactive to light.  Cardiovascular:     Rate and Rhythm: Normal rate and regular rhythm.  Pulmonary:     Effort: Pulmonary effort is normal.     Breath sounds: Normal breath sounds.  Abdominal:     General: Abdomen is flat.     Palpations: Abdomen is soft.  Musculoskeletal:        General: Normal range of motion.  Skin:    General: Skin is warm and dry.  Neurological:     General: No focal deficit present.     Mental Status: He is alert. Mental status is at baseline.  Psychiatric:        Attention and Perception: Attention normal.        Mood and Affect: Mood normal. Affect is blunt.        Speech: Speech is delayed.        Behavior: Behavior is slowed.        Thought Content: Thought content normal.    Review of Systems  Constitutional: Negative.   HENT: Negative.    Eyes: Negative.   Respiratory: Negative.    Cardiovascular: Negative.   Gastrointestinal: Negative.   Musculoskeletal: Negative.   Skin: Negative.   Neurological: Negative.   Psychiatric/Behavioral: Negative.     Blood pressure 128/77, pulse (!)  110, temperature 98.4 F (36.9 C), temperature source Oral, resp. rate 20, height 5\' 9"  (1.753 m), weight 96.4 kg, SpO2 97 %. Body mass index is 31.38 kg/m.   Treatment Plan Summary: Plan waiting for discharge placement  Mordecai Rasmussen, MD 08/13/2022, 4:48 PM

## 2022-08-13 NOTE — Plan of Care (Signed)
  Problem: Education: Goal: Knowledge of General Education information will improve Description: Including pain rating scale, medication(s)/side effects and non-pharmacologic comfort measures Outcome: Progressing   Problem: Health Behavior/Discharge Planning: Goal: Ability to manage health-related needs will improve Outcome: Progressing   Problem: Clinical Measurements: Goal: Ability to maintain clinical measurements within normal limits will improve Outcome: Progressing Goal: Will remain free from infection Outcome: Progressing Goal: Diagnostic test results will improve Outcome: Progressing Goal: Respiratory complications will improve Outcome: Progressing Goal: Cardiovascular complication will be avoided Outcome: Progressing   Problem: Activity: Goal: Risk for activity intolerance will decrease Outcome: Progressing   Problem: Nutrition: Goal: Adequate nutrition will be maintained Outcome: Progressing   Problem: Coping: Goal: Level of anxiety will decrease Outcome: Progressing   Problem: Elimination: Goal: Will not experience complications related to urinary retention Outcome: Progressing   Problem: Pain Managment: Goal: General experience of comfort will improve Outcome: Progressing   Problem: Safety: Goal: Ability to remain free from injury will improve Outcome: Progressing   Problem: Skin Integrity: Goal: Risk for impaired skin integrity will decrease Outcome: Progressing   Problem: Education: Goal: Knowledge of [facility] Education information/materials will improve Outcome: Progressing Goal: Emotional status will improve Outcome: Progressing Goal: Mental status will improve Outcome: Progressing Goal: Verbalization of understanding the information provided will improve Outcome: Progressing   Problem: Activity: Goal: Interest or engagement in activities will improve Outcome: Progressing Goal: Sleeping patterns will improve Outcome:  Progressing   Problem: Coping: Goal: Ability to verbalize frustrations and anger appropriately will improve Outcome: Progressing Goal: Ability to demonstrate self-control will improve Outcome: Progressing   Problem: Health Behavior/Discharge Planning: Goal: Identification of resources available to assist in meeting health care needs will improve Outcome: Progressing Goal: Compliance with treatment plan for underlying cause of condition will improve Outcome: Progressing   Problem: Physical Regulation: Goal: Ability to maintain clinical measurements within normal limits will improve Outcome: Progressing   Problem: Safety: Goal: Periods of time without injury will increase Outcome: Progressing   Problem: Activity: Goal: Will verbalize the importance of balancing activity with adequate rest periods Outcome: Progressing   Problem: Education: Goal: Will be free of psychotic symptoms Outcome: Progressing Goal: Knowledge of the prescribed therapeutic regimen will improve Outcome: Progressing   Problem: Coping: Goal: Coping ability will improve Outcome: Progressing Goal: Will verbalize feelings Outcome: Progressing   Problem: Health Behavior/Discharge Planning: Goal: Compliance with prescribed medication regimen will improve Outcome: Progressing   Problem: Nutritional: Goal: Ability to achieve adequate nutritional intake will improve Outcome: Progressing   Problem: Role Relationship: Goal: Ability to communicate needs accurately will improve Outcome: Progressing Goal: Ability to interact with others will improve Outcome: Progressing   Problem: Safety: Goal: Ability to redirect hostility and anger into socially appropriate behaviors will improve Outcome: Progressing Goal: Ability to remain free from injury will improve Outcome: Progressing   Problem: Self-Care: Goal: Ability to participate in self-care as condition permits will improve Outcome: Progressing    Problem: Self-Concept: Goal: Will verbalize positive feelings about self Outcome: Progressing   

## 2022-08-14 DIAGNOSIS — F203 Undifferentiated schizophrenia: Secondary | ICD-10-CM | POA: Diagnosis not present

## 2022-08-14 LAB — CBC WITH DIFFERENTIAL/PLATELET
Abs Immature Granulocytes: 0.03 10*3/uL (ref 0.00–0.07)
Basophils Absolute: 0 10*3/uL (ref 0.0–0.1)
Basophils Relative: 1 %
Eosinophils Absolute: 0.2 10*3/uL (ref 0.0–0.5)
Eosinophils Relative: 3 %
HCT: 43.6 % (ref 39.0–52.0)
Hemoglobin: 14.1 g/dL (ref 13.0–17.0)
Immature Granulocytes: 1 %
Lymphocytes Relative: 26 %
Lymphs Abs: 1.7 10*3/uL (ref 0.7–4.0)
MCH: 28.3 pg (ref 26.0–34.0)
MCHC: 32.3 g/dL (ref 30.0–36.0)
MCV: 87.4 fL (ref 80.0–100.0)
Monocytes Absolute: 0.6 10*3/uL (ref 0.1–1.0)
Monocytes Relative: 10 %
Neutro Abs: 4 10*3/uL (ref 1.7–7.7)
Neutrophils Relative %: 59 %
Platelets: 183 10*3/uL (ref 150–400)
RBC: 4.99 MIL/uL (ref 4.22–5.81)
RDW: 13.4 % (ref 11.5–15.5)
WBC: 6.5 10*3/uL (ref 4.0–10.5)
nRBC: 0 % (ref 0.0–0.2)

## 2022-08-14 NOTE — Progress Notes (Signed)
This writer assumed care of patient at 1300. Patient is found in his room, in bed asleep. Patient does not appear to be in any acute distress at this time. Patient remains safe on the unit at this time.

## 2022-08-14 NOTE — Group Note (Signed)
[facility] LCSW Group Therapy Note   Group Date: 08/14/2022 Start Time: 1315 End Time: 1410   Type of Therapy/Topic:  Group Therapy:  Emotion Regulation  Participation Level:  Did Not Attend    Description of Group:    The purpose of this group is to assist patients in learning to regulate negative emotions and experience positive emotions. Patients will be guided to discuss ways in which they have been vulnerable to their negative emotions. These vulnerabilities will be juxtaposed with experiences of positive emotions or situations, and patients challenged to use positive emotions to combat negative ones. Special emphasis will be placed on coping with negative emotions in conflict situations, and patients will process healthy conflict resolution skills.  Therapeutic Goals: Patient will identify two positive emotions or experiences to reflect on in order to balance out negative emotions:  Patient will label two or more emotions that they find the most difficult to experience:  Patient will be able to demonstrate positive conflict resolution skills through discussion or role plays:   Summary of Patient Progress: X   Therapeutic Modalities:   Cognitive Behavioral Therapy Feelings Identification Dialectical Behavioral Therapy   Travis Sandoval R Hall Birchard, LCSW 

## 2022-08-14 NOTE — Plan of Care (Signed)
  Problem: Education: Goal: Knowledge of General Education information will improve Description: Including pain rating scale, medication(s)/side effects and non-pharmacologic comfort measures Outcome: Progressing   Problem: Health Behavior/Discharge Planning: Goal: Ability to manage health-related needs will improve Outcome: Progressing   Problem: Clinical Measurements: Goal: Ability to maintain clinical measurements within normal limits will improve Outcome: Progressing Goal: Will remain free from infection Outcome: Progressing Goal: Diagnostic test results will improve Outcome: Progressing Goal: Respiratory complications will improve Outcome: Progressing Goal: Cardiovascular complication will be avoided Outcome: Progressing   Problem: Activity: Goal: Risk for activity intolerance will decrease Outcome: Progressing   Problem: Nutrition: Goal: Adequate nutrition will be maintained Outcome: Progressing   Problem: Coping: Goal: Level of anxiety will decrease Outcome: Progressing   Problem: Elimination: Goal: Will not experience complications related to urinary retention Outcome: Progressing   Problem: Pain Managment: Goal: General experience of comfort will improve Outcome: Progressing   Problem: Safety: Goal: Ability to remain free from injury will improve Outcome: Progressing   Problem: Skin Integrity: Goal: Risk for impaired skin integrity will decrease Outcome: Progressing   Problem: Education: Goal: Knowledge of  General Education information/materials will improve Outcome: Progressing Goal: Emotional status will improve Outcome: Progressing Goal: Mental status will improve Outcome: Progressing Goal: Verbalization of understanding the information provided will improve Outcome: Progressing   Problem: Activity: Goal: Interest or engagement in activities will improve Outcome: Progressing Goal: Sleeping patterns will improve Outcome:  Progressing   Problem: Coping: Goal: Ability to verbalize frustrations and anger appropriately will improve Outcome: Progressing Goal: Ability to demonstrate self-control will improve Outcome: Progressing   Problem: Health Behavior/Discharge Planning: Goal: Identification of resources available to assist in meeting health care needs will improve Outcome: Progressing Goal: Compliance with treatment plan for underlying cause of condition will improve Outcome: Progressing   Problem: Physical Regulation: Goal: Ability to maintain clinical measurements within normal limits will improve Outcome: Progressing   Problem: Safety: Goal: Periods of time without injury will increase Outcome: Progressing   Problem: Activity: Goal: Will verbalize the importance of balancing activity with adequate rest periods Outcome: Progressing   Problem: Education: Goal: Will be free of psychotic symptoms Outcome: Progressing Goal: Knowledge of the prescribed therapeutic regimen will improve Outcome: Progressing   Problem: Coping: Goal: Coping ability will improve Outcome: Progressing Goal: Will verbalize feelings Outcome: Progressing   Problem: Health Behavior/Discharge Planning: Goal: Compliance with prescribed medication regimen will improve Outcome: Progressing   Problem: Nutritional: Goal: Ability to achieve adequate nutritional intake will improve Outcome: Progressing   Problem: Role Relationship: Goal: Ability to communicate needs accurately will improve Outcome: Progressing Goal: Ability to interact with others will improve Outcome: Progressing   Problem: Safety: Goal: Ability to redirect hostility and anger into socially appropriate behaviors will improve Outcome: Progressing Goal: Ability to remain free from injury will improve Outcome: Progressing   Problem: Self-Care: Goal: Ability to participate in self-care as condition permits will improve Outcome: Progressing    Problem: Self-Concept: Goal: Will verbalize positive feelings about self Outcome: Progressing   

## 2022-08-14 NOTE — Consult Note (Signed)
Pharmacy Consult - Clozapine     42 yo male  This patient's order has been reviewed for prescribing contraindications.    Clozapine REMS enrollment Verified: yes on 04/17/22 REMS patient ID: [id number] Current Outpatient Monitoring: Every 2 weeks   Home Regimen:  225 mg po BID Last dose: unknown   Dose Adjustments This Admission: Dose started at clozapine 100 mg po daily Dose is currently clozapine 350 mg po HS (started 3/12) >  qHS 08/04/2022   Labs: Date    ANC    Submitted? 12/27 2800 Yes 01/03 3500 Yes 01/10   3100 Yes 01/24 3000 Yes 01/31 4400 yes   02/07 4300 yes 02/14 4500 Yes 02/21 5500 Yes 02/28 4080 Yes 03/06 4300 Yes  03/13 4100 Yes 03/20 4800 Yes 03/27 4800 Yes 04/03 4200 Yes 04/10 5400 Yes 04/17 3570 Yes 04/24 4000 Yes  Plan: Continue clozapine 300 mg po HS Monitor ANC at least weekly while inpatient. Next CBC with differential on 08/21/22.   Liana Gerold, PharmD 08/14/2022 12:22 PM

## 2022-08-14 NOTE — Progress Notes (Signed)
[facility] MD Progress Note  08/14/2022 3:17 PM Travis Sandoval  MRN:  [id number] Subjective: No new complaints.  Denies hallucinations.  No dangerous behavior Principal Problem: Undifferentiated schizophrenia Diagnosis: Principal Problem:   Undifferentiated schizophrenia  Total Time spent with patient: 30 minutes  Past Psychiatric History: Past history of schizophrenia  Past Medical History:  Past Medical History:  Diagnosis Date   ADHD    Bipolar 1 disorder (HCC)    Hepatitis C    Schizoaffective disorder (HCC)    History reviewed. No pertinent surgical history. Family History:  Family History  Family history unknown: Yes   Family Psychiatric  History: See previous Social History:  Social History   Substance and Sexual Activity  Alcohol Use Not Currently     Social History   Substance and Sexual Activity  Drug Use Not Currently    Social History   Socioeconomic History   Marital status: Unknown    Spouse name: Not on file   Number of children: Not on file   Years of education: Not on file   Highest education level: Not on file  Occupational History   Not on file  Tobacco Use   Smoking status: Every Day    Packs/day: 0.25    Years: 15.00    Additional pack years: 0.00    Total pack years: 3.75    Types: Cigarettes   Smokeless tobacco: Not on file  Vaping Use   Vaping Use: Not on file  Substance and Sexual Activity   Alcohol use: Not Currently   Drug use: Not Currently   Sexual activity: Not Currently  Other Topics Concern   Not on file  Social History Narrative   Not on file   Social Determinants of Health   Financial Resource Strain: Not on file  Food Insecurity: Patient Declined (05/18/2022)   Hunger Vital Sign    Worried About Running Out of Food in the Last Year: Patient declined    Ran Out of Food in the Last Year: Patient declined  Transportation Needs: Patient Declined (05/18/2022)   PRAPARE - Administrator, Civil Service (Medical):  Patient declined    Lack of Transportation (Non-Medical): Patient declined  Physical Activity: Not on file  Stress: Not on file  Social Connections: Not on file   Additional Social History:                         Sleep: Fair  Appetite:  Fair  Current Medications: Current Facility-Administered Medications  Medication Dose Route Frequency Provider Last Rate Last Admin   acetaminophen (TYLENOL) tablet 650 mg  650 mg Oral Q6H PRN Gabriel Cirri F, NP   650 mg at 08/02/22 0800   alum & mag hydroxide-simeth (MAALOX/MYLANTA) 200-200-20 MG/5ML suspension 30 mL  30 mL Oral Q4H PRN Gabriel Cirri F, NP   30 mL at 08/11/22 2146   ARIPiprazole ER (ABILIFY MAINTENA) injection 400 mg  400 mg Intramuscular Q28 days Grafton Warzecha T, MD   400 mg at 07/25/22 1501   cloZAPine (CLOZARIL) tablet 300 mg  300 mg Oral QHS Islam Villescas T, MD   300 mg at 08/13/22 2114   docusate sodium (COLACE) capsule 200 mg  200 mg Oral BID Shamere Campas T, MD   200 mg at 07/27/22 0939   famotidine (PEPCID) tablet 20 mg  20 mg Oral BID Aspen Deterding T, MD   20 mg at 07/20/22 1740   hydrOXYzine (ATARAX) tablet 50  mg  50 mg Oral Q6H PRN Jullisa Grigoryan, [name], MD   50 mg at 08/10/22 2143   magnesium citrate solution 1 Bottle  1 Bottle Oral Daily PRN Jaynie Bream, RPH   1 Bottle at 07/19/22 1643   magnesium hydroxide (MILK OF MAGNESIA) suspension 30 mL  30 mL Oral Daily PRN Jaynie Bream, RPH   30 mL at 07/18/22 1718   mineral oil liquid   Oral Daily PRN Sarina Ill, DO   Given at 07/17/22 1201   nicotine polacrilex (NICORETTE) gum 2 mg  2 mg Oral PRN Laynee Lockamy, [name], MD   2 mg at 04/23/22 2200   polyethylene glycol (MIRALAX / GLYCOLAX) packet 17 g  17 g Oral Daily Kiasha Bellin, [name], MD   17 g at 08/14/22 1478   senna-docusate (Senokot-S) tablet 2 tablet  2 tablet Oral Daily PRN Brandley Aldrete, [name], MD   2 tablet at 07/14/22 0809   ziprasidone (GEODON) injection 20 mg  20 mg Intramuscular Q12H PRN Reggie Pile, MD        Lab Results:  Results for orders placed or performed during the hospital encounter of 04/16/22 (from the past 48 hour(s))  CBC with Differential/Platelet     Status: None   Collection Time: 08/14/22  7:25 AM  Result Value Ref Range   WBC 6.5 4.0 - 10.5 K/uL   RBC 4.99 4.22 - 5.81 MIL/uL   Hemoglobin 14.1 13.0 - 17.0 g/dL   HCT 29.5 62.1 - 30.8 %   MCV 87.4 80.0 - 100.0 fL   MCH 28.3 26.0 - 34.0 pg   MCHC 32.3 30.0 - 36.0 g/dL   RDW 65.7 84.6 - 96.2 %   Platelets 183 150 - 400 K/uL   nRBC 0.0 0.0 - 0.2 %   Neutrophils Relative % 59 %   Neutro Abs 4.0 1.7 - 7.7 K/uL   Lymphocytes Relative 26 %   Lymphs Abs 1.7 0.7 - 4.0 K/uL   Monocytes Relative 10 %   Monocytes Absolute 0.6 0.1 - 1.0 K/uL   Eosinophils Relative 3 %   Eosinophils Absolute 0.2 0.0 - 0.5 K/uL   Basophils Relative 1 %   Basophils Absolute 0.0 0.0 - 0.1 K/uL   Immature Granulocytes 1 %   Abs Immature Granulocytes 0.03 0.00 - 0.07 K/uL    Comment: Performed at [facility], [address] [place], [place] [address]    Blood Alcohol level:  Lab Results  Component Value Date   [facility] <10 04/15/2022    Metabolic Disorder Labs: Lab Results  Component Value Date   HGBA1C 5.5 04/24/2022   MPG 111 04/24/2022   No results found for: "PROLACTIN" Lab Results  Component Value Date   CHOL 220 (H) 04/24/2022   TRIG 700 (H) 04/24/2022   HDL 35 (L) 04/24/2022   CHOLHDL 6.3 04/24/2022   VLDL UNABLE TO CALCULATE IF TRIGLYCERIDE OVER 400 mg/dL 13/24/4010   LDLCALC UNABLE TO CALCULATE IF TRIGLYCERIDE OVER 400 mg/dL 27/25/3664    Physical Findings: AIMS: Facial and Oral Movements Muscles of Facial Expression: None, normal Lips and Perioral Area: None, normal Jaw: None, normal Tongue: None, normal,Extremity Movements Upper (arms, wrists, hands, fingers): None, normal Lower (legs, knees, ankles, toes): None, normal, Trunk Movements Neck, shoulders, hips: None, normal, Overall  Severity Severity of abnormal movements (highest score from questions above): None, normal Incapacitation due to abnormal movements: None, normal Patient's awareness of abnormal movements (rate only patient's report): No Awareness,  Dental Status Current problems with teeth and/or dentures?: No Does patient usually wear dentures?: No  CIWA:    COWS:     Musculoskeletal: Strength & Muscle Tone: within normal limits Gait & Station: normal Patient leans: N/A  Psychiatric Specialty Exam:  Presentation  General Appearance:  Disheveled  Eye Contact: Minimal  Speech: Pressured  Speech Volume: Normal  Handedness: Right   Mood and Affect  Mood: Euphoric; Irritable  Affect: Inappropriate   Thought Process  Thought Processes: Disorganized  Descriptions of Associations:Loose  Orientation:Partial  Thought Content:Illogical; Scattered  History of Schizophrenia/Schizoaffective disorder:No data recorded Duration of Psychotic Symptoms:No data recorded Hallucinations:No data recorded Ideas of Reference:Delusions; Paranoia  Suicidal Thoughts:No data recorded Homicidal Thoughts:No data recorded  Sensorium  Memory: Immediate Poor; Recent Poor; Remote Poor  Judgment: Poor  Insight: Poor   Executive Functions  Concentration: Fair  Attention Span: Fair  Recall: Poor  Fund of Knowledge: Poor  Language: Poor   Psychomotor Activity  Psychomotor Activity:No data recorded  Assets  Assets: Desire for Improvement; Social Support   Sleep  Sleep:No data recorded   Physical Exam: Physical Exam Vitals reviewed.  Constitutional:      Appearance: Normal appearance.  HENT:     Head: Normocephalic and atraumatic.     Mouth/Throat:     Pharynx: Oropharynx is clear.  Eyes:     Pupils: Pupils are equal, round, and reactive to light.  Cardiovascular:     Rate and Rhythm: Normal rate and regular rhythm.  Pulmonary:     Effort: Pulmonary effort is  normal.     Breath sounds: Normal breath sounds.  Abdominal:     General: Abdomen is flat.     Palpations: Abdomen is soft.  Musculoskeletal:        General: Normal range of motion.  Skin:    General: Skin is warm and dry.  Neurological:     General: No focal deficit present.     Mental Status: He is alert. Mental status is at baseline.  Psychiatric:        Mood and Affect: Mood normal.        Thought Content: Thought content normal.    Review of Systems  Constitutional: Negative.   HENT: Negative.    Eyes: Negative.   Respiratory: Negative.    Cardiovascular: Negative.   Gastrointestinal: Negative.   Musculoskeletal: Negative.   Skin: Negative.   Neurological: Negative.   Psychiatric/Behavioral: Negative.     Blood pressure 118/78, pulse (!) 111, temperature 98.8 F (37.1 C), temperature source Oral, resp. rate 18, height 5\' 9"  (1.753 m), weight 96.4 kg, SpO2 97 %. Body mass index is 31.38 kg/m.   Treatment Plan Summary: Plan no change to treatment plan.  No change to medicine.  Continue to work on discharge  Mordecai Rasmussen, MD 08/14/2022, 3:17 PM

## 2022-08-14 NOTE — Progress Notes (Signed)
Patient calm and pleasant during assessment denying SI/HI/AVH. Pt compliant with medication administration per MD orders. Pt given education, support, and encouragement to be active in his treatment plan. Pt being monitored Q 15 minutes for safety per unit protocol, remains safe on the unit  

## 2022-08-15 NOTE — Progress Notes (Signed)
Patient calm and pleasant during assessment denying SI/HI/AVH. Pt compliant with medication administration per MD orders. Pt given education, support, and encouragement to be active in his treatment plan. Pt being monitored Q 15 minutes for safety per unit protocol, remains safe on the unit  

## 2022-08-15 NOTE — BH IP Treatment Plan (Signed)
Interdisciplinary Treatment and Diagnostic Plan Update  08/15/2022 Time of Session: 08:30 Travis Sandoval MRN: [id number]  Principal Diagnosis: Undifferentiated schizophrenia  Secondary Diagnoses: Principal Problem:   Undifferentiated schizophrenia   Current Medications:  Current Facility-Administered Medications  Medication Dose Route Frequency Provider Last Rate Last Admin   acetaminophen (TYLENOL) tablet 650 mg  650 mg Oral Q6H PRN Gabriel Cirri F, NP   650 mg at 08/02/22 0800   alum & mag hydroxide-simeth (MAALOX/MYLANTA) 200-200-20 MG/5ML suspension 30 mL  30 mL Oral Q4H PRN Gabriel Cirri F, NP   30 mL at 08/11/22 2146   ARIPiprazole ER (ABILIFY MAINTENA) injection 400 mg  400 mg Intramuscular Q28 days [name], [name], MD   400 mg at 07/25/22 1501   cloZAPine (CLOZARIL) tablet 300 mg  300 mg Oral QHS [name], [name], MD   300 mg at 08/14/22 2107   docusate sodium (COLACE) capsule 200 mg  200 mg Oral BID [name], [name], MD   200 mg at 07/27/22 0939   famotidine (PEPCID) tablet 20 mg  20 mg Oral BID [name], [name], MD   20 mg at 07/20/22 1740   hydrOXYzine (ATARAX) tablet 50 mg  50 mg Oral Q6H PRN [name], [name], MD   50 mg at 08/10/22 2143   magnesium citrate solution 1 Bottle  1 Bottle Oral Daily PRN Jaynie Bream, RPH   1 Bottle at 07/19/22 1643   magnesium hydroxide (MILK OF MAGNESIA) suspension 30 mL  30 mL Oral Daily PRN Jaynie Bream, RPH   30 mL at 07/18/22 1718   mineral oil liquid   Oral Daily PRN Sarina Ill, DO   Given at 07/17/22 1201   nicotine polacrilex (NICORETTE) gum 2 mg  2 mg Oral PRN [name], [name], MD   2 mg at 04/23/22 2200   polyethylene glycol (MIRALAX / GLYCOLAX) packet 17 g  17 g Oral Daily [name], [name], MD   17 g at 08/15/22 0858   senna-docusate (Senokot-S) tablet 2 tablet  2 tablet Oral Daily PRN [name], [name], MD   2 tablet at 07/14/22 0809   ziprasidone (GEODON) injection 20 mg  20 mg Intramuscular Q12H PRN Reggie Pile, MD       PTA Medications: Medications Prior to Admission  Medication Sig Dispense Refill Last Dose   ABILIFY MAINTENA 400 MG SRER injection Inject 400 mg into the muscle every 28 (twenty-eight) days.      ARIPiprazole (ABILIFY) 10 MG tablet Take 10 mg by mouth daily. (Patient not taking: Reported on 04/16/2022)      atomoxetine (STRATTERA) 40 MG capsule Take 40 mg by mouth daily. (Patient not taking: Reported on 04/16/2022)      atorvastatin (LIPITOR) 40 MG tablet Take 40 mg by mouth daily.      budesonide-formoterol (SYMBICORT) 160-4.5 MCG/ACT inhaler Inhale 2 puffs into the lungs.      cetirizine (ZYRTEC) 10 MG tablet Take 10 mg by mouth daily.      clozapine (CLOZARIL) 200 MG tablet Take 200 mg by mouth 2 (two) times daily. Take along with one 25 mg tablet for total 225 mg twice daily      cloZAPine (CLOZARIL) 25 MG tablet Take 25 mg by mouth 2 (two) times daily. Take along with one 200 mg tablet for total 225 mg twice daily      Dexlansoprazole (DEXILANT) 30 MG capsule Take 30 mg by mouth daily. (Patient not taking: Reported on 04/16/2022)  hydrOXYzine (VISTARIL) 50 MG capsule Take 50 mg by mouth 3 (three) times daily as needed for itching. (Patient not taking: Reported on 04/16/2022)      Melatonin 5 MG TABS Take 5 mg by mouth at bedtime as needed (sleep).      metoprolol succinate (TOPROL-XL) 25 MG 24 hr tablet Take 12.5 mg by mouth daily.      Phosphatidyl Choline 65 MG TABS Take 1 tablet by mouth daily.  (Patient not taking: Reported on 04/16/2022)      valproic acid (DEPAKENE) 250 MG/5ML solution Take 750-1,000 mLs by mouth 2 (two) times daily. 1000 mg (20 mL) every morning and 750 mg (15 mL) daily at bedtime       Patient Stressors: Other: Psychosis    Patient Strengths: Ability for insight  Active sense of humor  Average or above average intelligence  Capable of independent living  Supportive family/friends   Treatment Modalities: Medication Management, Group  therapy, Case management,  1 to 1 session with clinician, Psychoeducation, Recreational therapy.   Physician Treatment Plan for Primary Diagnosis: Undifferentiated schizophrenia Long Term Goal(s):     Short Term Goals: None, pt is a poor hx.  Medication Management: Evaluate patient's response, side effects, and tolerance of medication regimen.  Therapeutic Interventions: 1 to 1 sessions, Unit Group sessions and Medication administration.  Evaluation of Outcomes: Adequate for Discharge  Physician Treatment Plan for Secondary Diagnosis: Principal Problem:   Undifferentiated schizophrenia  Long Term Goal(s):     Short Term Goals: None, pt is a poor hx.     Medication Management: Evaluate patient's response, side effects, and tolerance of medication regimen.  Therapeutic Interventions: 1 to 1 sessions, Unit Group sessions and Medication administration.  Evaluation of Outcomes: Adequate for Discharge   RN Treatment Plan for Primary Diagnosis: Undifferentiated schizophrenia Long Term Goal(s): Knowledge of disease and therapeutic regimen to maintain health will improve  Short Term Goals: Ability to remain free from injury will improve, Ability to verbalize frustration and anger appropriately will improve, Ability to demonstrate self-control, Ability to participate in decision making will improve, Ability to verbalize feelings will improve, Ability to disclose and discuss suicidal ideas, Ability to identify and develop effective coping behaviors will improve, and Compliance with prescribed medications will improve  Medication Management: RN will administer medications as ordered by provider, will assess and evaluate patient's response and provide education to patient for prescribed medication. RN will report any adverse and/or side effects to prescribing provider.  Therapeutic Interventions: 1 on 1 counseling sessions, Psychoeducation, Medication administration, Evaluate responses to  treatment, Monitor vital signs and CBGs as ordered, Perform/monitor CIWA, COWS, AIMS and Fall Risk screenings as ordered, Perform wound care treatments as ordered.  Evaluation of Outcomes: Adequate for Discharge   LCSW Treatment Plan for Primary Diagnosis: Undifferentiated schizophrenia Long Term Goal(s): Safe transition to appropriate next level of care at discharge, Engage patient in therapeutic group addressing interpersonal concerns.  Short Term Goals: Engage patient in aftercare planning with referrals and resources, Increase social support, Increase ability to appropriately verbalize feelings, Increase emotional regulation, Facilitate acceptance of mental health diagnosis and concerns, and Increase skills for wellness and recovery  Therapeutic Interventions: Assess for all discharge needs, 1 to 1 time with Social worker, Explore available resources and support systems, Assess for adequacy in community support network, Educate family and significant other(s) on suicide prevention, Complete Psychosocial Assessment, Interpersonal group therapy.  Evaluation of Outcomes: Adequate for Discharge   Progress in Treatment: Attending groups: No. Participating  in groups: No. Taking medication as prescribed: Yes. Toleration medication: Yes. Family/Significant other contact made: Yes, individual(s) contacted:  SPE completed with pt and guardian. Patient understands diagnosis: No. Discussing patient identified problems/goals with staff: Yes. Medical problems stabilized or resolved: Yes. Denies suicidal/homicidal ideation: Yes. Issues/concerns per patient self-inventory: No. Other: none.  New problem(s) identified: No, Describe:  none Update 06/30/2021:  No changes at this time. Update 07/06/2022:  No changes at this time. Update 07/11/22: No changes at this time. Update 07/16/22: none. Update 07/21/22: none. Update 07/26/22: No changes at this time.  Update 07/31/2022:  No changes at this time.  Update  08/05/2022: No change at this time. Update 08/10/22: No changes at this time. Update 08/15/22: No changes at this time.   New Short Term/Long Term Goal(s): elimination of symptoms of psychosis, medication management for mood stabilization; elimination of SI thoughts; development of comprehensive mental wellness plan. Update 06/30/2021:  No changes at this time. Update 07/06/2022:  No changes at this time. Update 07/11/22: No changes at this time. Update 07/16/22: Patient to work towards elimination of symptoms of psychosis, medication management for mood stabilization; development of comprehensive mental wellness plan. Update 07/21/22: Patient to work towards elimination of symptoms of psychosis, medication management for mood stabilization; development of comprehensive mental wellness plan. Update 07/26/22: No changes at this time.  Update 07/31/2022:  No changes at this time.  Update 08/05/2022: No change at this time. Update 08/10/22: No changes at this time. Update 08/15/22: No changes at this time.    Patient Goals:  No additional treatment goals identified by patient. Update 06/30/2021:  No changes at this time. Update 07/06/2022:  No changes at this time. Update 07/11/22: No changes at this time. Update 07/16/22: No additional goals identified at this time. Patient to continue to work towards original goals identified in initial treatment team meeting. CSW will remain available to patient should they voice additional treatment goals. Update 07/21/22: No additional goals identified at this time. Patient to continue to work towards original goals identified in initial treatment team meeting. CSW will remain available to patient should they voice additional treatment goals. Update 07/26/22: No changes at this time.  Update 07/31/2022:  No changes at this time. Update 08/05/2022: No change at this time. Update 08/10/22: No changes at this time. Update 08/15/22: No changes at this time.   Discharge Plan or Barriers: Patient remains  on the unit as placement continues to be sought. Funding remains the concern at this time and biggest barrier to placement at this time. Update 06/30/2021:  No changes at this time. Update 07/06/2022:  No changes at this time. Update 07/11/22: No changes at this time. Update 07/16/22: Patient lacks funding for placement, [employer] rep continues to communicate with social security to restart payment. Patient has guardianship hearing on this day. Situation ongoing, CSW will continue to monitor and update note as more information becomes available. Update 07/21/22: Patient lacks adequate funding, CSW to continue pursing payor source to secure placement. Patient is unable to living independently per psychiatrist. Update 07/26/22: No changes at this time. Update 07/31/2022:  No changes at this time.  Update 08/05/2022: No change at this time. Update 08/10/22: No changes at this time. Update 08/15/22: No changes at this time.   Reason for Continuation of Hospitalization: Anxiety Delusions  Depression Hallucinations   Estimated Length of Stay: TBD Update 07/06/2022:  TBD Update 07/11/22: No changes at this time. Update 07/16/22: TBD. Update 07/21/22: TBD. Update 07/26/22: No changes  at this time.  Update 07/31/2022:  No changes at this time.  Update 08/05/2022: No change at this time. Update 08/10/22: No changes at this time. Update 08/15/22: No changes at this time.   Last 3 [place] Suicide Severity Risk Score: Flowsheet Row Admission (Current) from 04/16/2022 in [facility] INPATIENT BE[facility]ICINE ED from 04/15/2022 in [facility] Emergency Department at [facility]  C-SSRS RISK CATEGORY No Risk No Risk       Last PHQ 2/9 Scores:     No data to display          Scribe for Treatment Team: Glenis Smoker, LCSW 08/15/2022 8:59 AM

## 2022-08-15 NOTE — Progress Notes (Signed)
D- Patient alert and oriented x 3-4. Affect flat/mood preoccupied. Denies SI/ HI/ AVH. Patient denies pain. He request PRN Tylenol for signs or symptoms of "cold" like symptoms. Patient denies depression and anxiety.  A- Scheduled medications administered to patient, per MD orders. Support and encouragement provided.  Routine safety checks conducted every 15 minutes without incident.  Patient informed to notify staff with problems or concerns and verbalizes understanding. R- No adverse drug reactions noted. Patient compliant with medications and treatment plan. Patient receptive, calm cooperative and interacts well with others on the unit. He is isolative to room except for meals but went to outdoor activity today. Patient contracts for safety and  remains safe on the unit at this time.

## 2022-08-15 NOTE — Progress Notes (Signed)
[facility] MD Progress Note  08/15/2022 2:56 PM Travis Sandoval  MRN:  [id number] Subjective: No new complaints.  Denies hallucinations.  No dangerous behavior Principal Problem: Undifferentiated schizophrenia Diagnosis: Principal Problem:   Undifferentiated schizophrenia  Total Time spent with patient: 30 minutes  Past Psychiatric History: Past history of schizophrenia  Past Medical History:  Past Medical History:  Diagnosis Date   ADHD    Bipolar 1 disorder (HCC)    Hepatitis C    Schizoaffective disorder (HCC)    History reviewed. No pertinent surgical history. Family History:  Family History  Family history unknown: Yes   Family Psychiatric  History: See previous Social History:  Social History   Substance and Sexual Activity  Alcohol Use Not Currently     Social History   Substance and Sexual Activity  Drug Use Not Currently    Social History   Socioeconomic History   Marital status: Unknown    Spouse name: Not on file   Number of children: Not on file   Years of education: Not on file   Highest education level: Not on file  Occupational History   Not on file  Tobacco Use   Smoking status: Every Day    Packs/day: 0.25    Years: 15.00    Additional pack years: 0.00    Total pack years: 3.75    Types: Cigarettes   Smokeless tobacco: Not on file  Vaping Use   Vaping Use: Not on file  Substance and Sexual Activity   Alcohol use: Not Currently   Drug use: Not Currently   Sexual activity: Not Currently  Other Topics Concern   Not on file  Social History Narrative   Not on file   Social Determinants of Health   Financial Resource Strain: Not on file  Food Insecurity: Patient Declined (05/18/2022)   Hunger Vital Sign    Worried About Running Out of Food in the Last Year: Patient declined    Ran Out of Food in the Last Year: Patient declined  Transportation Needs: Patient Declined (05/18/2022)   PRAPARE - Administrator, Civil Service (Medical):  Patient declined    Lack of Transportation (Non-Medical): Patient declined  Physical Activity: Not on file  Stress: Not on file  Social Connections: Not on file   Additional Social History:                         Sleep: Fair  Appetite:  Fair  Current Medications: Current Facility-Administered Medications  Medication Dose Route Frequency Provider Last Rate Last Admin   acetaminophen (TYLENOL) tablet 650 mg  650 mg Oral Q6H PRN Gabriel Cirri F, NP   650 mg at 08/15/22 1213   alum & mag hydroxide-simeth (MAALOX/MYLANTA) 200-200-20 MG/5ML suspension 30 mL  30 mL Oral Q4H PRN Gabriel Cirri F, NP   30 mL at 08/11/22 2146   ARIPiprazole ER (ABILIFY MAINTENA) injection 400 mg  400 mg Intramuscular Q28 days Markitta Ausburn T, MD   400 mg at 07/25/22 1501   cloZAPine (CLOZARIL) tablet 300 mg  300 mg Oral QHS Samya Siciliano T, MD   300 mg at 08/14/22 2107   docusate sodium (COLACE) capsule 200 mg  200 mg Oral BID Jazalyn Mondor T, MD   200 mg at 07/27/22 0939   famotidine (PEPCID) tablet 20 mg  20 mg Oral BID Arletha Marschke T, MD   20 mg at 07/20/22 1740   hydrOXYzine (ATARAX) tablet 50  mg  50 mg Oral Q6H PRN Keslyn Teater, [name], MD   50 mg at 08/10/22 2143   magnesium citrate solution 1 Bottle  1 Bottle Oral Daily PRN Jaynie Bream, RPH   1 Bottle at 07/19/22 1643   magnesium hydroxide (MILK OF MAGNESIA) suspension 30 mL  30 mL Oral Daily PRN Jaynie Bream, RPH   30 mL at 07/18/22 1718   mineral oil liquid   Oral Daily PRN Sarina Ill, DO   Given at 07/17/22 1201   nicotine polacrilex (NICORETTE) gum 2 mg  2 mg Oral PRN Narya Beavin, [name], MD   2 mg at 04/23/22 2200   polyethylene glycol (MIRALAX / GLYCOLAX) packet 17 g  17 g Oral Daily Ronesha Heenan, [name], MD   17 g at 08/15/22 1610   senna-docusate (Senokot-S) tablet 2 tablet  2 tablet Oral Daily PRN Soliana Kitko, [name], MD   2 tablet at 07/14/22 0809   ziprasidone (GEODON) injection 20 mg  20 mg Intramuscular Q12H PRN Reggie Pile, MD        Lab Results:  Results for orders placed or performed during the hospital encounter of 04/16/22 (from the past 48 hour(s))  CBC with Differential/Platelet     Status: None   Collection Time: 08/14/22  7:25 AM  Result Value Ref Range   WBC 6.5 4.0 - 10.5 K/uL   RBC 4.99 4.22 - 5.81 MIL/uL   Hemoglobin 14.1 13.0 - 17.0 g/dL   HCT 96.0 45.4 - 09.8 %   MCV 87.4 80.0 - 100.0 fL   MCH 28.3 26.0 - 34.0 pg   MCHC 32.3 30.0 - 36.0 g/dL   RDW 11.9 14.7 - 82.9 %   Platelets 183 150 - 400 K/uL   nRBC 0.0 0.0 - 0.2 %   Neutrophils Relative % 59 %   Neutro Abs 4.0 1.7 - 7.7 K/uL   Lymphocytes Relative 26 %   Lymphs Abs 1.7 0.7 - 4.0 K/uL   Monocytes Relative 10 %   Monocytes Absolute 0.6 0.1 - 1.0 K/uL   Eosinophils Relative 3 %   Eosinophils Absolute 0.2 0.0 - 0.5 K/uL   Basophils Relative 1 %   Basophils Absolute 0.0 0.0 - 0.1 K/uL   Immature Granulocytes 1 %   Abs Immature Granulocytes 0.03 0.00 - 0.07 K/uL    Comment: Performed at [facility], [address] [place], [place] [address]    Blood Alcohol level:  Lab Results  Component Value Date   [facility] <10 04/15/2022    Metabolic Disorder Labs: Lab Results  Component Value Date   HGBA1C 5.5 04/24/2022   MPG 111 04/24/2022   No results found for: "PROLACTIN" Lab Results  Component Value Date   CHOL 220 (H) 04/24/2022   TRIG 700 (H) 04/24/2022   HDL 35 (L) 04/24/2022   CHOLHDL 6.3 04/24/2022   VLDL UNABLE TO CALCULATE IF TRIGLYCERIDE OVER 400 mg/dL 08/65/7846   LDLCALC UNABLE TO CALCULATE IF TRIGLYCERIDE OVER 400 mg/dL 96/29/5284    Physical Findings: AIMS: Facial and Oral Movements Muscles of Facial Expression: None, normal Lips and Perioral Area: None, normal Jaw: None, normal Tongue: None, normal,Extremity Movements Upper (arms, wrists, hands, fingers): None, normal Lower (legs, knees, ankles, toes): None, normal, Trunk Movements Neck, shoulders, hips: None, normal, Overall  Severity Severity of abnormal movements (highest score from questions above): None, normal Incapacitation due to abnormal movements: None, normal Patient's awareness of abnormal movements (rate only patient's report): No Awareness,  Dental Status Current problems with teeth and/or dentures?: No Does patient usually wear dentures?: No  CIWA:    COWS:     Musculoskeletal: Strength & Muscle Tone: within normal limits Gait & Station: normal Patient leans: N/A  Psychiatric Specialty Exam:  Presentation  General Appearance:  Disheveled  Eye Contact: Minimal  Speech: Pressured  Speech Volume: Normal  Handedness: Right   Mood and Affect  Mood: Euphoric; Irritable  Affect: Inappropriate   Thought Process  Thought Processes: Disorganized  Descriptions of Associations:Loose  Orientation:Partial  Thought Content:Illogical; Scattered  History of Schizophrenia/Schizoaffective disorder:No data recorded Duration of Psychotic Symptoms:No data recorded Hallucinations:No data recorded Ideas of Reference:Delusions; Paranoia  Suicidal Thoughts:No data recorded Homicidal Thoughts:No data recorded  Sensorium  Memory: Immediate Poor; Recent Poor; Remote Poor  Judgment: Poor  Insight: Poor   Executive Functions  Concentration: Fair  Attention Span: Fair  Recall: Poor  Fund of Knowledge: Poor  Language: Poor   Psychomotor Activity  Psychomotor Activity:No data recorded  Assets  Assets: Desire for Improvement; Social Support   Sleep  Sleep:No data recorded   Physical Exam: Physical Exam Vitals and nursing note reviewed.  Constitutional:      Appearance: Normal appearance.  HENT:     Head: Normocephalic and atraumatic.     Mouth/Throat:     Pharynx: Oropharynx is clear.  Eyes:     Pupils: Pupils are equal, round, and reactive to light.  Cardiovascular:     Rate and Rhythm: Normal rate and regular rhythm.  Pulmonary:     Effort:  Pulmonary effort is normal.     Breath sounds: Normal breath sounds.  Abdominal:     General: Abdomen is flat.     Palpations: Abdomen is soft.  Musculoskeletal:        General: Normal range of motion.  Skin:    General: Skin is warm and dry.  Neurological:     General: No focal deficit present.     Mental Status: He is alert. Mental status is at baseline.  Psychiatric:        Mood and Affect: Mood normal.        Thought Content: Thought content normal.    Review of Systems  Constitutional: Negative.   HENT: Negative.    Eyes: Negative.   Respiratory: Negative.    Cardiovascular: Negative.   Gastrointestinal: Negative.   Musculoskeletal: Negative.   Skin: Negative.   Neurological: Negative.   Psychiatric/Behavioral: Negative.     Blood pressure 111/76, pulse (!) 106, temperature 99.1 F (37.3 C), temperature source Oral, resp. rate 18, height  (1.753 m), weight 96.4 kg, SpO2 97 %. Body mass index is 31.38 kg/m.   Treatment Plan Summary: Plan no change to treatment plan.  No change to medicine.  Continue to work on discharge  Mordecai Rasmussen, MD 08/15/2022, 2:56 PM

## 2022-08-15 NOTE — Group Note (Signed)
Recreation Therapy Group Note   Group Topic:Self-Esteem  Group Date: 08/15/2022 Start Time: 1000 End Time: 1100 Facilitators: Rosina Lowenstein, LRT, CTRS Location:  Craft Room  Group Description: Patients and LRT discussed the importance of self-love and self-esteem. Pt completed a worksheet that helps them identify 24 different strengths and qualities about themselves. Pt encouraged to read aloud at least 3 off their sheet to the group. LRT and pts discussed how this can be applied to daily life post-discharge.  Pt's then played "Positive Affirmation Bingo" afterwards, with journals, activity books or stress balls as bingo prizes.  Goal Area(s) Addressed: Patient will identify positive qualities about themselves. Patient will learn new positive affirmations.  Patient will recite positive qualities and affirmations aloud to the group.   Affect/Mood: N/A   Participation Level: Did not attend    Clinical Observations/Individualized Feedback: Travis Sandoval did not attend group due to resting in his room.  Plan: Continue to engage patient in RT group sessions 2-3x/week.   Rosina Lowenstein, LRT, CTRS 08/15/2022 11:35 AM

## 2022-08-15 NOTE — Group Note (Signed)
[facility] LCSW Group Therapy Note   Group Date: 08/15/2022 Start Time: 1314 End Time: 1405   Type of Therapy/Topic:  Group Therapy:  Balance in Life  Participation Level:  Did Not Attend   Description of Group:    This group will address the concept of balance and how it feels and looks when one is unbalanced. Patients will be encouraged to process areas in their lives that are out of balance, and identify reasons for remaining unbalanced. Facilitators will guide patients utilizing problem- solving interventions to address and correct the stressor making their life unbalanced. Understanding and applying boundaries will be explored and addressed for obtaining  and maintaining a balanced life. Patients will be encouraged to explore ways to assertively make their unbalanced needs known to significant others in their lives, using other group members and facilitator for support and feedback.  Therapeutic Goals: Patient will identify two or more emotions or situations they have that consume much of in their lives. Patient will identify signs/triggers that life has become out of balance:  Patient will identify two ways to set boundaries in order to achieve balance in their lives:  Patient will demonstrate ability to communicate their needs through discussion and/or role plays  Summary of Patient Progress: X   Therapeutic Modalities:   Cognitive Behavioral Therapy Solution-Focused Therapy Assertiveness Training   Brinnley Lacap R Sunil Hue, LCSW 

## 2022-08-16 DIAGNOSIS — F203 Undifferentiated schizophrenia: Secondary | ICD-10-CM | POA: Diagnosis not present

## 2022-08-16 NOTE — Progress Notes (Signed)
[facility] MD Progress Note  08/16/2022 4:45 PM Travis Sandoval  MRN:  [id number] Subjective: No change to presentation.  Remains withdrawn but denies any complaints.  Takes care of his health adequately.  Compliant with medicine Principal Problem: Undifferentiated schizophrenia (HCC) Diagnosis: Principal Problem:   Undifferentiated schizophrenia (HCC)  Total Time spent with patient: 30 minutes  Past Psychiatric History: History of schizophrenia  Past Medical History:  Past Medical History:  Diagnosis Date   ADHD    Bipolar 1 disorder (HCC)    Hepatitis C    Schizoaffective disorder (HCC)    History reviewed. No pertinent surgical history. Family History:  Family History  Family history unknown: Yes   Family Psychiatric  History: See previous Social History:  Social History   Substance and Sexual Activity  Alcohol Use Not Currently     Social History   Substance and Sexual Activity  Drug Use Not Currently    Social History   Socioeconomic History   Marital status: Unknown    Spouse name: Not on file   Number of children: Not on file   Years of education: Not on file   Highest education level: Not on file  Occupational History   Not on file  Tobacco Use   Smoking status: Every Day    Packs/day: 0.25    Years: 15.00    Additional pack years: 0.00    Total pack years: 3.75    Types: Cigarettes   Smokeless tobacco: Not on file  Vaping Use   Vaping Use: Not on file  Substance and Sexual Activity   Alcohol use: Not Currently   Drug use: Not Currently   Sexual activity: Not Currently  Other Topics Concern   Not on file  Social History Narrative   Not on file   Social Determinants of Health   Financial Resource Strain: Not on file  Food Insecurity: Patient Declined (05/18/2022)   Hunger Vital Sign    Worried About Running Out of Food in the Last Year: Patient declined    Ran Out of Food in the Last Year: Patient declined  Transportation Needs: Patient Declined  (05/18/2022)   PRAPARE - Administrator, Civil Service (Medical): Patient declined    Lack of Transportation (Non-Medical): Patient declined  Physical Activity: Not on file  Stress: Not on file  Social Connections: Not on file   Additional Social History:                         Sleep: Fair  Appetite:  Fair  Current Medications: Current Facility-Administered Medications  Medication Dose Route Frequency Provider Last Rate Last Admin   acetaminophen (TYLENOL) tablet 650 mg  650 mg Oral Q6H PRN Gabriel Cirri F, NP   650 mg at 08/15/22 2053   alum & mag hydroxide-simeth (MAALOX/MYLANTA) 200-200-20 MG/5ML suspension 30 mL  30 mL Oral Q4H PRN Gabriel Cirri F, NP   30 mL at 08/11/22 2146   ARIPiprazole ER (ABILIFY MAINTENA) injection 400 mg  400 mg Intramuscular Q28 days Jayln Branscom T, MD   400 mg at 07/25/22 1501   cloZAPine (CLOZARIL) tablet 300 mg  300 mg Oral QHS Waldine Zenz T, MD   300 mg at 08/15/22 2054   docusate sodium (COLACE) capsule 200 mg  200 mg Oral BID Timur Nibert, [name], MD   200 mg at 07/27/22 0939   famotidine (PEPCID) tablet 20 mg  20 mg Oral BID Eliseo Withers, [name], MD  20 mg at 07/20/22 1740   hydrOXYzine (ATARAX) tablet 50 mg  50 mg Oral Q6H PRN Laurie Lovejoy, [name], MD   50 mg at 08/10/22 2143   magnesium citrate solution 1 Bottle  1 Bottle Oral Daily PRN Jaynie Bream, RPH   1 Bottle at 07/19/22 1643   magnesium hydroxide (MILK OF MAGNESIA) suspension 30 mL  30 mL Oral Daily PRN Jaynie Bream, RPH   30 mL at 07/18/22 1718   mineral oil liquid   Oral Daily PRN Sarina Ill, DO   Given at 07/17/22 1201   nicotine polacrilex (NICORETTE) gum 2 mg  2 mg Oral PRN Kenslei Hearty, [name], MD   2 mg at 04/23/22 2200   polyethylene glycol (MIRALAX / GLYCOLAX) packet 17 g  17 g Oral Daily Yaeko Fazekas, [name], MD   17 g at 08/15/22 0858   senna-docusate (Senokot-S) tablet 2 tablet  2 tablet Oral Daily PRN Shawnia Vizcarrondo, [name], MD   2 tablet at 07/14/22 0809    ziprasidone (GEODON) injection 20 mg  20 mg Intramuscular Q12H PRN Reggie Pile, MD        Lab Results: No results found for this or any previous visit (from the past 48 hour(s)).  Blood Alcohol level:  Lab Results  Component Value Date   ETH <10 04/15/2022    Metabolic Disorder Labs: Lab Results  Component Value Date   HGBA1C 5.5 04/24/2022   MPG 111 04/24/2022   No results found for: "PROLACTIN" Lab Results  Component Value Date   CHOL 220 (H) 04/24/2022   TRIG 700 (H) 04/24/2022   HDL 35 (L) 04/24/2022   CHOLHDL 6.3 04/24/2022   VLDL UNABLE TO CALCULATE IF TRIGLYCERIDE OVER 400 mg/dL 96/07/5407   LDLCALC UNABLE TO CALCULATE IF TRIGLYCERIDE OVER 400 mg/dL 81/19/1478    Physical Findings: AIMS: Facial and Oral Movements Muscles of Facial Expression: None, normal Lips and Perioral Area: None, normal Jaw: None, normal Tongue: None, normal,Extremity Movements Upper (arms, wrists, hands, fingers): None, normal Lower (legs, knees, ankles, toes): None, normal, Trunk Movements Neck, shoulders, hips: None, normal, Overall Severity Severity of abnormal movements (highest score from questions above): None, normal Incapacitation due to abnormal movements: None, normal Patient's awareness of abnormal movements (rate only patient's report): No Awareness, Dental Status Current problems with teeth and/or dentures?: No Does patient usually wear dentures?: No  CIWA:    COWS:     Musculoskeletal: Strength & Muscle Tone: within normal limits Gait & Station: normal Patient leans: N/A  Psychiatric Specialty Exam:  Presentation  General Appearance:  Disheveled  Eye Contact: Minimal  Speech: Pressured  Speech Volume: Normal  Handedness: Right   Mood and Affect  Mood: Euphoric; Irritable  Affect: Inappropriate   Thought Process  Thought Processes: Disorganized  Descriptions of Associations:Loose  Orientation:Partial  Thought Content:Illogical;  Scattered  History of Schizophrenia/Schizoaffective disorder:No data recorded Duration of Psychotic Symptoms:No data recorded Hallucinations:No data recorded Ideas of Reference:Delusions; Paranoia  Suicidal Thoughts:No data recorded Homicidal Thoughts:No data recorded  Sensorium  Memory: Immediate Poor; Recent Poor; Remote Poor  Judgment: Poor  Insight: Poor   Executive Functions  Concentration: Fair  Attention Span: Fair  Recall: Poor  Fund of Knowledge: Poor  Language: Poor   Psychomotor Activity  Psychomotor Activity:No data recorded  Assets  Assets: Desire for Improvement; Social Support   Sleep  Sleep:No data recorded   Physical Exam: Physical Exam Vitals and nursing note reviewed.  Constitutional:      Appearance:  Normal appearance.  HENT:     Head: Normocephalic and atraumatic.     Mouth/Throat:     Pharynx: Oropharynx is clear.  Eyes:     Pupils: Pupils are equal, round, and reactive to light.  Cardiovascular:     Rate and Rhythm: Normal rate and regular rhythm.  Pulmonary:     Effort: Pulmonary effort is normal.     Breath sounds: Normal breath sounds.  Abdominal:     General: Abdomen is flat.     Palpations: Abdomen is soft.  Musculoskeletal:        General: Normal range of motion.  Skin:    General: Skin is warm and dry.  Neurological:     General: No focal deficit present.     Mental Status: He is alert. Mental status is at baseline.  Psychiatric:        Attention and Perception: He is inattentive.        Mood and Affect: Mood normal. Affect is blunt.        Speech: Speech is delayed.        Behavior: Behavior is slowed.        Thought Content: Thought content normal.    Review of Systems  Constitutional: Negative.   HENT: Negative.    Eyes: Negative.   Respiratory: Negative.    Cardiovascular: Negative.   Gastrointestinal: Negative.   Musculoskeletal: Negative.   Skin: Negative.   Neurological: Negative.    Psychiatric/Behavioral: Negative.     Blood pressure 104/74, pulse (!) 104, temperature 97.8 F (36.6 C), temperature source Oral, resp. rate 20, height 5\' 9"  (1.753 m), weight 96.4 kg, SpO2 95 %. Body mass index is 31.38 kg/m.   Treatment Plan Summary: Plan no change to medication.  No change to overall treatment plan.  Staff aware of his condition and we are still just waiting on discharge planning  Mordecai Rasmussen, MD 08/16/2022, 4:45 PM

## 2022-08-16 NOTE — Group Note (Signed)
Recreation Therapy Group Note   Group Topic:Leisure Education  Group Date: 08/16/2022 Start Time: 1000 End Time: 1100 Facilitators: Rosina Lowenstein, LRT, CTRS Location:  Craft Room  Group Description: Leisure. Patients were given the option to choose from singing karaoke, make origami, playing with a deck of cards, or coloring mandalas. LRT and pts discussed the importance of participating in leisure during their free time and when they're outside of the hospital. Pt identified two leisure interests and shared with the group.   Goal Area(s) Addressed:  Patient will identify a current leisure interest.  Patient will practice making a positive decision. Patient will have the opportunity to try a new leisure activity.  Affect/Mood: N/A   Participation Level: Did not attend    Clinical Observations/Individualized Feedback: Shakur did not attend group due to resting in his room.  Plan: Continue to engage patient in RT group sessions 2-3x/week.   Rosina Lowenstein, LRT, CTRS 08/16/2022 11:25 AM

## 2022-08-16 NOTE — Plan of Care (Signed)
D- Patient alert and oriented. Patient presented in a pleasant mood on assessment reporting that he slept "good" last night and had no complaints to voice to this Clinical research associate. Patient also denied any signs/symptoms of depression and anxiety. Patient also denied SI/HI/AVH and pain at this time. Patient had no stated goals for today.   A- Some of scheduled medications administered to patient, per MD orders. Patient continues to only want Miralax and nothing else. Support and encouragement provided. Routine safety checks conducted every 15 minutes. Patient informed to notify staff with problems or concerns.   R- No adverse drug reactions noted. Patient contracts for safety at this time. Patient compliant with some medications. Patient receptive, calm, and cooperative. Patient isolates to room, except for meals and to take certain medications. Patient remains safe at this time  Problem: Education: Goal: Knowledge of General Education information will improve Description: Including pain rating scale, medication(s)/side effects and non-pharmacologic comfort measures Outcome: Progressing   Problem: Health Behavior/Discharge Planning: Goal: Ability to manage health-related needs will improve Outcome: Progressing   Problem: Clinical Measurements: Goal: Ability to maintain clinical measurements within normal limits will improve Outcome: Progressing Goal: Will remain free from infection Outcome: Progressing Goal: Diagnostic test results will improve Outcome: Progressing Goal: Respiratory complications will improve Outcome: Progressing Goal: Cardiovascular complication will be avoided Outcome: Progressing   Problem: Activity: Goal: Risk for activity intolerance will decrease Outcome: Progressing   Problem: Nutrition: Goal: Adequate nutrition will be maintained Outcome: Progressing   Problem: Coping: Goal: Level of anxiety will decrease Outcome: Progressing   Problem: Elimination: Goal: Will  not experience complications related to urinary retention Outcome: Progressing   Problem: Pain Managment: Goal: General experience of comfort will improve Outcome: Progressing   Problem: Safety: Goal: Ability to remain free from injury will improve Outcome: Progressing   Problem: Skin Integrity: Goal: Risk for impaired skin integrity will decrease Outcome: Progressing   Problem: Education: Goal: Knowledge of [place] [facility] Education information/materials will improve Outcome: Progressing Goal: Emotional status will improve Outcome: Progressing Goal: Mental status will improve Outcome: Progressing Goal: Verbalization of understanding the information provided will improve Outcome: Progressing   Problem: Activity: Goal: Interest or engagement in activities will improve Outcome: Progressing Goal: Sleeping patterns will improve Outcome: Progressing   Problem: Coping: Goal: Ability to verbalize frustrations and anger appropriately will improve Outcome: Progressing Goal: Ability to demonstrate self-control will improve Outcome: Progressing   Problem: Health Behavior/Discharge Planning: Goal: Identification of resources available to assist in meeting health care needs will improve Outcome: Progressing Goal: Compliance with treatment plan for underlying cause of condition will improve Outcome: Progressing   Problem: Physical Regulation: Goal: Ability to maintain clinical measurements within normal limits will improve Outcome: Progressing   Problem: Safety: Goal: Periods of time without injury will increase Outcome: Progressing   Problem: Activity: Goal: Will verbalize the importance of balancing activity with adequate rest periods Outcome: Progressing   Problem: Education: Goal: Will be free of psychotic symptoms Outcome: Progressing Goal: Knowledge of the prescribed therapeutic regimen will improve Outcome: Progressing   Problem: Coping: Goal: Coping ability  will improve Outcome: Progressing Goal: Will verbalize feelings Outcome: Progressing   Problem: Health Behavior/Discharge Planning: Goal: Compliance with prescribed medication regimen will improve Outcome: Progressing   Problem: Nutritional: Goal: Ability to achieve adequate nutritional intake will improve Outcome: Progressing   Problem: Role Relationship: Goal: Ability to communicate needs accurately will improve Outcome: Progressing Goal: Ability to interact with others will improve Outcome: Progressing   Problem: Safety:  Goal: Ability to redirect hostility and anger into socially appropriate behaviors will improve Outcome: Progressing Goal: Ability to remain free from injury will improve Outcome: Progressing   Problem: Self-Care: Goal: Ability to participate in self-care as condition permits will improve Outcome: Progressing   Problem: Self-Concept: Goal: Will verbalize positive feelings about self Outcome: Progressing

## 2022-08-16 NOTE — Progress Notes (Signed)
Patient only takes Miralax, however, he stated that he would take it later today. This Clinical research associate moved medication to 1200. MD will be notified during progression rounds.

## 2022-08-17 DIAGNOSIS — F203 Undifferentiated schizophrenia: Secondary | ICD-10-CM | POA: Diagnosis not present

## 2022-08-17 MED ORDER — GUAIFENESIN 100 MG/5ML PO LIQD
5.0000 mL | ORAL | Status: DC | PRN
  Administered 2022-08-18 – 2022-08-22 (×7): 5 mL via ORAL
  Filled 2022-08-17 (×8): qty 10
  Filled 2022-08-17: qty 5

## 2022-08-17 NOTE — Progress Notes (Signed)
[facility] MD Progress Note  08/17/2022 1:47 PM Travis Sandoval  MRN:  [id number] Subjective: Follow-up for this patient with undifferentiated schizophrenia.  Still in his room most of the time but eating well taking care of himself basically and compliant with medicine.  No new complaints and no requests.  Denies suicidal thought. Principal Problem: Undifferentiated schizophrenia (HCC) Diagnosis: Principal Problem:   Undifferentiated schizophrenia (HCC)  Total Time spent with patient: 30 minutes  Past Psychiatric History: Past history of schizophrenia  Past Medical History:  Past Medical History:  Diagnosis Date   ADHD    Bipolar 1 disorder (HCC)    Hepatitis C    Schizoaffective disorder (HCC)    History reviewed. No pertinent surgical history. Family History:  Family History  Family history unknown: Yes   Family Psychiatric  History: See previous Social History:  Social History   Substance and Sexual Activity  Alcohol Use Not Currently     Social History   Substance and Sexual Activity  Drug Use Not Currently    Social History   Socioeconomic History   Marital status: Unknown    Spouse name: Not on file   Number of children: Not on file   Years of education: Not on file   Highest education level: Not on file  Occupational History   Not on file  Tobacco Use   Smoking status: Every Day    Packs/day: 0.25    Years: 15.00    Additional pack years: 0.00    Total pack years: 3.75    Types: Cigarettes   Smokeless tobacco: Not on file  Vaping Use   Vaping Use: Not on file  Substance and Sexual Activity   Alcohol use: Not Currently   Drug use: Not Currently   Sexual activity: Not Currently  Other Topics Concern   Not on file  Social History Narrative   Not on file   Social Determinants of Health   Financial Resource Strain: Not on file  Food Insecurity: Patient Declined (05/18/2022)   Hunger Vital Sign    Worried About Running Out of Food in the Last Year:  Patient declined    Ran Out of Food in the Last Year: Patient declined  Transportation Needs: Patient Declined (05/18/2022)   PRAPARE - Administrator, Civil Service (Medical): Patient declined    Lack of Transportation (Non-Medical): Patient declined  Physical Activity: Not on file  Stress: Not on file  Social Connections: Not on file   Additional Social History:                         Sleep: Fair  Appetite:  Fair  Current Medications: Current Facility-Administered Medications  Medication Dose Route Frequency Provider Last Rate Last Admin   acetaminophen (TYLENOL) tablet 650 mg  650 mg Oral Q6H PRN Gabriel Cirri F, NP   650 mg at 08/16/22 2134   alum & mag hydroxide-simeth (MAALOX/MYLANTA) 200-200-20 MG/5ML suspension 30 mL  30 mL Oral Q4H PRN Gabriel Cirri F, NP   30 mL at 08/16/22 2134   ARIPiprazole ER (ABILIFY MAINTENA) injection 400 mg  400 mg Intramuscular Q28 days Stephens Shreve T, MD   400 mg at 07/25/22 1501   cloZAPine (CLOZARIL) tablet 300 mg  300 mg Oral QHS Pressley Tadesse T, MD   300 mg at 08/16/22 2133   docusate sodium (COLACE) capsule 200 mg  200 mg Oral BID Neyla Gauntt, [name], MD   200 mg at  07/27/22 0939   famotidine (PEPCID) tablet 20 mg  20 mg Oral BID Jamyah Folk T, MD   20 mg at 07/20/22 1740   hydrOXYzine (ATARAX) tablet 50 mg  50 mg Oral Q6H PRN Kaisyn Millea, [name], MD   50 mg at 08/10/22 2143   magnesium citrate solution 1 Bottle  1 Bottle Oral Daily PRN Jaynie Bream, RPH   1 Bottle at 07/19/22 1643   magnesium hydroxide (MILK OF MAGNESIA) suspension 30 mL  30 mL Oral Daily PRN Jaynie Bream, RPH   30 mL at 07/18/22 1718   mineral oil liquid   Oral Daily PRN Sarina Ill, DO   Given at 07/17/22 1201   nicotine polacrilex (NICORETTE) gum 2 mg  2 mg Oral PRN Pristine Gladhill, [name], MD   2 mg at 04/23/22 2200   polyethylene glycol (MIRALAX / GLYCOLAX) packet 17 g  17 g Oral Daily Tarik Teixeira, [name], MD   17 g at 08/16/22 1719    senna-docusate (Senokot-S) tablet 2 tablet  2 tablet Oral Daily PRN Jelisha Weed, [name], MD   2 tablet at 07/14/22 0809   ziprasidone (GEODON) injection 20 mg  20 mg Intramuscular Q12H PRN Reggie Pile, MD        Lab Results: No results found for this or any previous visit (from the past 48 hour(s)).  Blood Alcohol level:  Lab Results  Component Value Date   ETH <10 04/15/2022    Metabolic Disorder Labs: Lab Results  Component Value Date   HGBA1C 5.5 04/24/2022   MPG 111 04/24/2022   No results found for: "PROLACTIN" Lab Results  Component Value Date   CHOL 220 (H) 04/24/2022   TRIG 700 (H) 04/24/2022   HDL 35 (L) 04/24/2022   CHOLHDL 6.3 04/24/2022   VLDL UNABLE TO CALCULATE IF TRIGLYCERIDE OVER 400 mg/dL 16/01/9603   LDLCALC UNABLE TO CALCULATE IF TRIGLYCERIDE OVER 400 mg/dL 54/12/8117    Physical Findings: AIMS: Facial and Oral Movements Muscles of Facial Expression: None, normal Lips and Perioral Area: None, normal Jaw: None, normal Tongue: None, normal,Extremity Movements Upper (arms, wrists, hands, fingers): None, normal Lower (legs, knees, ankles, toes): None, normal, Trunk Movements Neck, shoulders, hips: None, normal, Overall Severity Severity of abnormal movements (highest score from questions above): None, normal Incapacitation due to abnormal movements: None, normal Patient's awareness of abnormal movements (rate only patient's report): No Awareness, Dental Status Current problems with teeth and/or dentures?: No Does patient usually wear dentures?: No  CIWA:    COWS:     Musculoskeletal: Strength & Muscle Tone: within normal limits Gait & Station: normal Patient leans: N/A  Psychiatric Specialty Exam:  Presentation  General Appearance:  Disheveled  Eye Contact: Minimal  Speech: Pressured  Speech Volume: Normal  Handedness: Right   Mood and Affect  Mood: Euphoric; Irritable  Affect: Inappropriate   Thought Process  Thought  Processes: Disorganized  Descriptions of Associations:Loose  Orientation:Partial  Thought Content:Illogical; Scattered  History of Schizophrenia/Schizoaffective disorder:No data recorded Duration of Psychotic Symptoms:No data recorded Hallucinations:No data recorded Ideas of Reference:Delusions; Paranoia  Suicidal Thoughts:No data recorded Homicidal Thoughts:No data recorded  Sensorium  Memory: Immediate Poor; Recent Poor; Remote Poor  Judgment: Poor  Insight: Poor   Executive Functions  Concentration: Fair  Attention Span: Fair  Recall: Poor  Fund of Knowledge: Poor  Language: Poor   Psychomotor Activity  Psychomotor Activity:No data recorded  Assets  Assets: Desire for Improvement; Social Support   Sleep  Sleep:No data  recorded   Physical Exam: Physical Exam Vitals reviewed.  Constitutional:      Appearance: Normal appearance.  HENT:     Head: Normocephalic and atraumatic.     Mouth/Throat:     Pharynx: Oropharynx is clear.  Eyes:     Pupils: Pupils are equal, round, and reactive to light.  Cardiovascular:     Rate and Rhythm: Normal rate and regular rhythm.  Pulmonary:     Effort: Pulmonary effort is normal.     Breath sounds: Normal breath sounds.  Abdominal:     General: Abdomen is flat.     Palpations: Abdomen is soft.  Musculoskeletal:        General: Normal range of motion.  Skin:    General: Skin is warm and dry.  Neurological:     General: No focal deficit present.     Mental Status: He is alert. Mental status is at baseline.  Psychiatric:        Attention and Perception: Attention normal.        Mood and Affect: Mood normal. Affect is blunt.        Speech: Speech is delayed.        Behavior: Behavior is withdrawn.        Thought Content: Thought content normal.    Review of Systems  Constitutional: Negative.   HENT: Negative.    Eyes: Negative.   Respiratory: Negative.    Cardiovascular: Negative.    Gastrointestinal: Negative.   Musculoskeletal: Negative.   Skin: Negative.   Neurological: Negative.   Psychiatric/Behavioral: Negative.     Blood pressure 126/82, pulse 96, temperature 97.6 F (36.4 C), temperature source Oral, resp. rate 18, height 5\' 9"  (1.753 m), weight 96.4 kg, SpO2 97 %. Body mass index is 31.38 kg/m.   Treatment Plan Summary: Plan no change to treatment plan.  No change to medicine.  Psychoeducation and review of plan provided.  Continue to wait for placement.  Mordecai Rasmussen, MD 08/17/2022, 1:47 PM

## 2022-08-17 NOTE — Plan of Care (Signed)
D- Patient alert and oriented. Patient presented in a pleasant mood on assessment reporting that he slept "good" last night and had no complaints to voice to this Clinical research associate. Patient denied any signs/symptoms of depression/anxiety. Patient also denied SI/HI/AVH and pain at this time. Patient had no stated goals for today.   A- Some of scheduled medications administered to patient, per MD orders. Patient continues to only want Miralax and nothing else. Support and encouragement provided. Routine safety checks conducted every 15 minutes. Patient informed to notify staff with problems or concerns.   R- No adverse drug reactions noted. Patient contracts for safety at this time. Patient compliant with some medications. Patient receptive, calm, and cooperative. Patient isolates to room, except for meals and medications. Patient remains safe at this time  Problem: Education: Goal: Knowledge of General Education information will improve Description: Including pain rating scale, medication(s)/side effects and non-pharmacologic comfort measures Outcome: Progressing   Problem: Health Behavior/Discharge Planning: Goal: Ability to manage health-related needs will improve Outcome: Progressing   Problem: Clinical Measurements: Goal: Ability to maintain clinical measurements within normal limits will improve Outcome: Progressing Goal: Will remain free from infection Outcome: Progressing Goal: Diagnostic test results will improve Outcome: Progressing Goal: Respiratory complications will improve Outcome: Progressing Goal: Cardiovascular complication will be avoided Outcome: Progressing   Problem: Activity: Goal: Risk for activity intolerance will decrease Outcome: Progressing   Problem: Nutrition: Goal: Adequate nutrition will be maintained Outcome: Progressing   Problem: Coping: Goal: Level of anxiety will decrease Outcome: Progressing   Problem: Elimination: Goal: Will not experience  complications related to urinary retention Outcome: Progressing   Problem: Pain Managment: Goal: General experience of comfort will improve Outcome: Progressing   Problem: Safety: Goal: Ability to remain free from injury will improve Outcome: Progressing   Problem: Skin Integrity: Goal: Risk for impaired skin integrity will decrease Outcome: Progressing   Problem: Education: Goal: Knowledge of [place] [facility] Education information/materials will improve Outcome: Progressing Goal: Emotional status will improve Outcome: Progressing Goal: Mental status will improve Outcome: Progressing Goal: Verbalization of understanding the information provided will improve Outcome: Progressing   Problem: Activity: Goal: Interest or engagement in activities will improve Outcome: Progressing Goal: Sleeping patterns will improve Outcome: Progressing   Problem: Coping: Goal: Ability to verbalize frustrations and anger appropriately will improve Outcome: Progressing Goal: Ability to demonstrate self-control will improve Outcome: Progressing   Problem: Health Behavior/Discharge Planning: Goal: Identification of resources available to assist in meeting health care needs will improve Outcome: Progressing Goal: Compliance with treatment plan for underlying cause of condition will improve Outcome: Progressing   Problem: Physical Regulation: Goal: Ability to maintain clinical measurements within normal limits will improve Outcome: Progressing   Problem: Safety: Goal: Periods of time without injury will increase Outcome: Progressing   Problem: Activity: Goal: Will verbalize the importance of balancing activity with adequate rest periods Outcome: Progressing   Problem: Education: Goal: Will be free of psychotic symptoms Outcome: Progressing Goal: Knowledge of the prescribed therapeutic regimen will improve Outcome: Progressing   Problem: Coping: Goal: Coping ability will  improve Outcome: Progressing Goal: Will verbalize feelings Outcome: Progressing   Problem: Health Behavior/Discharge Planning: Goal: Compliance with prescribed medication regimen will improve Outcome: Progressing   Problem: Nutritional: Goal: Ability to achieve adequate nutritional intake will improve Outcome: Progressing   Problem: Role Relationship: Goal: Ability to communicate needs accurately will improve Outcome: Progressing Goal: Ability to interact with others will improve Outcome: Progressing   Problem: Safety: Goal: Ability to redirect hostility and  anger into socially appropriate behaviors will improve Outcome: Progressing Goal: Ability to remain free from injury will improve Outcome: Progressing   Problem: Self-Care: Goal: Ability to participate in self-care as condition permits will improve Outcome: Progressing   Problem: Self-Concept: Goal: Will verbalize positive feelings about self Outcome: Progressing

## 2022-08-17 NOTE — Group Note (Signed)
Date:  08/17/2022 Time:  9:00 AM  Group Topic/Focus:  Goals Group:   The focus of this group is to help patients establish daily goals to achieve during treatment and discuss how the patient can incorporate goal setting into their daily lives to aide in recovery.  Community Group   Participation Level:  Did Not Attend  Efrem Pitstick A Jkai Arwood 08/17/2022, 6:03 PM

## 2022-08-18 DIAGNOSIS — F203 Undifferentiated schizophrenia: Secondary | ICD-10-CM | POA: Diagnosis not present

## 2022-08-18 NOTE — Progress Notes (Signed)
Patient alert and oriented x 4, affect is flat but brightens upon approach, she denies SI/HI/AVH will continue to monitor .

## 2022-08-18 NOTE — Progress Notes (Signed)
[facility] MD Progress Note  08/18/2022 12:07 PM Travis Sandoval  MRN:  [id number] Subjective: Follow-up patient with schizophrenia.  Patient seen and chart reviewed.  Patient denies any symptoms.  Still stays isolated to his room most of the time.  Does not have any requests.  Denies any physical problems. Principal Problem: Undifferentiated schizophrenia (HCC) Diagnosis: Principal Problem:   Undifferentiated schizophrenia (HCC)  Total Time spent with patient: 30 minutes  Past Psychiatric History: History of schizophrenia  Past Medical History:  Past Medical History:  Diagnosis Date   ADHD    Bipolar 1 disorder (HCC)    Hepatitis C    Schizoaffective disorder (HCC)    History reviewed. No pertinent surgical history. Family History:  Family History  Family history unknown: Yes   Family Psychiatric  History: See previous Social History:  Social History   Substance and Sexual Activity  Alcohol Use Not Currently     Social History   Substance and Sexual Activity  Drug Use Not Currently    Social History   Socioeconomic History   Marital status: Unknown    Spouse name: Not on file   Number of children: Not on file   Years of education: Not on file   Highest education level: Not on file  Occupational History   Not on file  Tobacco Use   Smoking status: Every Day    Packs/day: 0.25    Years: 15.00    Additional pack years: 0.00    Total pack years: 3.75    Types: Cigarettes   Smokeless tobacco: Not on file  Vaping Use   Vaping Use: Not on file  Substance and Sexual Activity   Alcohol use: Not Currently   Drug use: Not Currently   Sexual activity: Not Currently  Other Topics Concern   Not on file  Social History Narrative   Not on file   Social Determinants of Health   Financial Resource Strain: Not on file  Food Insecurity: Patient Declined (05/18/2022)   Hunger Vital Sign    Worried About Running Out of Food in the Last Year: Patient declined    Ran Out of  Food in the Last Year: Patient declined  Transportation Needs: Patient Declined (05/18/2022)   PRAPARE - Administrator, Civil Service (Medical): Patient declined    Lack of Transportation (Non-Medical): Patient declined  Physical Activity: Not on file  Stress: Not on file  Social Connections: Not on file   Additional Social History:                         Sleep: Fair  Appetite:  Fair  Current Medications: Current Facility-Administered Medications  Medication Dose Route Frequency Provider Last Rate Last Admin   acetaminophen (TYLENOL) tablet 650 mg  650 mg Oral Q6H PRN Gabriel Cirri F, NP   650 mg at 08/17/22 1718   alum & mag hydroxide-simeth (MAALOX/MYLANTA) 200-200-20 MG/5ML suspension 30 mL  30 mL Oral Q4H PRN Gabriel Cirri F, NP   30 mL at 08/16/22 2134   ARIPiprazole ER (ABILIFY MAINTENA) injection 400 mg  400 mg Intramuscular Q28 days Larie Mathes T, MD   400 mg at 07/25/22 1501   cloZAPine (CLOZARIL) tablet 300 mg  300 mg Oral QHS Hassani Sliney T, MD   300 mg at 08/17/22 2126   docusate sodium (COLACE) capsule 200 mg  200 mg Oral BID Sophina Mitten, [name], MD   200 mg at 07/27/22 [phone]  famotidine (PEPCID) tablet 20 mg  20 mg Oral BID Donnette Macmullen T, MD   20 mg at 07/20/22 1740   guaiFENesin (ROBITUSSIN) 100 MG/5ML liquid 5 mL  5 mL Oral Q4H PRN Lailanie Hasley T, MD   5 mL at 08/18/22 0831   hydrOXYzine (ATARAX) tablet 50 mg  50 mg Oral Q6H PRN Yailine Ballard T, MD   50 mg at 08/10/22 2143   magnesium citrate solution 1 Bottle  1 Bottle Oral Daily PRN Jaynie Bream, RPH   1 Bottle at 07/19/22 1643   magnesium hydroxide (MILK OF MAGNESIA) suspension 30 mL  30 mL Oral Daily PRN Jaynie Bream, RPH   30 mL at 07/18/22 1718   mineral oil liquid   Oral Daily PRN Sarina Ill, DO   Given at 07/17/22 1201   nicotine polacrilex (NICORETTE) gum 2 mg  2 mg Oral PRN Deyona Soza, [name], MD   2 mg at 04/23/22 2200   polyethylene glycol (MIRALAX /  GLYCOLAX) packet 17 g  17 g Oral Daily Orma Cheetham, [name], MD   17 g at 08/18/22 0831   senna-docusate (Senokot-S) tablet 2 tablet  2 tablet Oral Daily PRN Uilani Sanville, [name], MD   2 tablet at 07/14/22 0809   ziprasidone (GEODON) injection 20 mg  20 mg Intramuscular Q12H PRN Reggie Pile, MD        Lab Results: No results found for this or any previous visit (from the past 48 hour(s)).  Blood Alcohol level:  Lab Results  Component Value Date   ETH <10 04/15/2022    Metabolic Disorder Labs: Lab Results  Component Value Date   HGBA1C 5.5 04/24/2022   MPG 111 04/24/2022   No results found for: "PROLACTIN" Lab Results  Component Value Date   CHOL 220 (H) 04/24/2022   TRIG 700 (H) 04/24/2022   HDL 35 (L) 04/24/2022   CHOLHDL 6.3 04/24/2022   VLDL UNABLE TO CALCULATE IF TRIGLYCERIDE OVER 400 mg/dL 40/98/1191   LDLCALC UNABLE TO CALCULATE IF TRIGLYCERIDE OVER 400 mg/dL 47/82/9562    Physical Findings: AIMS: Facial and Oral Movements Muscles of Facial Expression: None, normal Lips and Perioral Area: None, normal Jaw: None, normal Tongue: None, normal,Extremity Movements Upper (arms, wrists, hands, fingers): None, normal Lower (legs, knees, ankles, toes): None, normal, Trunk Movements Neck, shoulders, hips: None, normal, Overall Severity Severity of abnormal movements (highest score from questions above): None, normal Incapacitation due to abnormal movements: None, normal Patient's awareness of abnormal movements (rate only patient's report): No Awareness, Dental Status Current problems with teeth and/or dentures?: No Does patient usually wear dentures?: No  CIWA:    COWS:     Musculoskeletal: Strength & Muscle Tone: within normal limits Gait & Station: normal Patient leans: N/A  Psychiatric Specialty Exam:  Presentation  General Appearance:  Disheveled  Eye Contact: Minimal  Speech: Pressured  Speech Volume: Normal  Handedness: Right   Mood and Affect   Mood: Euphoric; Irritable  Affect: Inappropriate   Thought Process  Thought Processes: Disorganized  Descriptions of Associations:Loose  Orientation:Partial  Thought Content:Illogical; Scattered  History of Schizophrenia/Schizoaffective disorder:No data recorded Duration of Psychotic Symptoms:No data recorded Hallucinations:No data recorded Ideas of Reference:Delusions; Paranoia  Suicidal Thoughts:No data recorded Homicidal Thoughts:No data recorded  Sensorium  Memory: Immediate Poor; Recent Poor; Remote Poor  Judgment: Poor  Insight: Poor   Executive Functions  Concentration: Fair  Attention Span: Fair  Recall: Poor  Fund of Knowledge: Poor  Language: Poor  Psychomotor Activity  Psychomotor Activity:No data recorded  Assets  Assets: Desire for Improvement; Social Support   Sleep  Sleep:No data recorded   Physical Exam: Physical Exam Vitals and nursing note reviewed.  Constitutional:      Appearance: Normal appearance.  HENT:     Head: Normocephalic and atraumatic.     Mouth/Throat:     Pharynx: Oropharynx is clear.  Eyes:     Pupils: Pupils are equal, round, and reactive to light.  Cardiovascular:     Rate and Rhythm: Normal rate and regular rhythm.  Pulmonary:     Effort: Pulmonary effort is normal.     Breath sounds: Normal breath sounds.  Abdominal:     General: Abdomen is flat.     Palpations: Abdomen is soft.  Musculoskeletal:        General: Normal range of motion.  Skin:    General: Skin is warm and dry.  Neurological:     General: No focal deficit present.     Mental Status: He is alert. Mental status is at baseline.  Psychiatric:        Attention and Perception: Attention normal.        Mood and Affect: Mood normal. Affect is blunt.        Speech: Speech normal.        Behavior: Behavior is withdrawn.        Thought Content: Thought content normal.    Review of Systems  Constitutional: Negative.   HENT:  Negative.    Eyes: Negative.   Respiratory: Negative.    Cardiovascular: Negative.   Gastrointestinal: Negative.   Musculoskeletal: Negative.   Skin: Negative.   Neurological: Negative.   Psychiatric/Behavioral: Negative.     Blood pressure 137/80, pulse 95, temperature 97.6 F (36.4 C), temperature source Oral, resp. rate 18, height 5\' 9"  (1.753 m), weight 96.4 kg, SpO2 97 %. Body mass index is 31.38 kg/m.   Treatment Plan Summary: Medication management and Plan no change to medication.  No change to treatment plan.  Continue primarily focusing on discharge possibilities  Mordecai Rasmussen, MD 08/18/2022, 12:07 PM

## 2022-08-18 NOTE — Plan of Care (Signed)
D- Patient alert and oriented. Patient presented in a pleasant mood on assessment stating that he slept "well" last night and had complaints of a cough, in which he did request PRN cough medicine from this writer to help with relief. Patient denied SI, HI, AVH, and pain at this time. Patient also denied any signs/symptoms of depression and anxiety, stating that overall, he is feeling "pretty good". Patient had no stated goals for today.  A- Some scheduled medications administered to patient, per MD orders. Patient only wants Miralax and no other scheduled medication on day-shift. Support and encouragement provided.  Routine safety checks conducted every 15 minutes. Patient informed to notify staff with problems or concerns.  R- No adverse drug reactions noted. Patient contracts for safety at this time. Patient compliant with medications and treatment plan. Patient receptive, calm, and cooperative. Patient isolates to room, except for meals and medication. Patient remains safe at this time.  Problem: Education: Goal: Knowledge of General Education information will improve Description: Including pain rating scale, medication(s)/side effects and non-pharmacologic comfort measures Outcome: Progressing   Problem: Health Behavior/Discharge Planning: Goal: Ability to manage health-related needs will improve Outcome: Progressing   Problem: Clinical Measurements: Goal: Ability to maintain clinical measurements within normal limits will improve Outcome: Progressing Goal: Will remain free from infection Outcome: Progressing Goal: Diagnostic test results will improve Outcome: Progressing Goal: Respiratory complications will improve Outcome: Progressing Goal: Cardiovascular complication will be avoided Outcome: Progressing   Problem: Activity: Goal: Risk for activity intolerance will decrease Outcome: Progressing   Problem: Nutrition: Goal: Adequate nutrition will be maintained Outcome:  Progressing   Problem: Coping: Goal: Level of anxiety will decrease Outcome: Progressing   Problem: Elimination: Goal: Will not experience complications related to urinary retention Outcome: Progressing   Problem: Pain Managment: Goal: General experience of comfort will improve Outcome: Progressing   Problem: Safety: Goal: Ability to remain free from injury will improve Outcome: Progressing   Problem: Skin Integrity: Goal: Risk for impaired skin integrity will decrease Outcome: Progressing   Problem: Education: Goal: Knowledge of [place] [facility] Education information/materials will improve Outcome: Progressing Goal: Emotional status will improve Outcome: Progressing Goal: Mental status will improve Outcome: Progressing Goal: Verbalization of understanding the information provided will improve Outcome: Progressing   Problem: Activity: Goal: Interest or engagement in activities will improve Outcome: Progressing Goal: Sleeping patterns will improve Outcome: Progressing   Problem: Coping: Goal: Ability to verbalize frustrations and anger appropriately will improve Outcome: Progressing Goal: Ability to demonstrate self-control will improve Outcome: Progressing   Problem: Health Behavior/Discharge Planning: Goal: Identification of resources available to assist in meeting health care needs will improve Outcome: Progressing Goal: Compliance with treatment plan for underlying cause of condition will improve Outcome: Progressing   Problem: Physical Regulation: Goal: Ability to maintain clinical measurements within normal limits will improve Outcome: Progressing   Problem: Safety: Goal: Periods of time without injury will increase Outcome: Progressing   Problem: Activity: Goal: Will verbalize the importance of balancing activity with adequate rest periods Outcome: Progressing   Problem: Education: Goal: Will be free of psychotic symptoms Outcome:  Progressing Goal: Knowledge of the prescribed therapeutic regimen will improve Outcome: Progressing   Problem: Coping: Goal: Coping ability will improve Outcome: Progressing Goal: Will verbalize feelings Outcome: Progressing   Problem: Health Behavior/Discharge Planning: Goal: Compliance with prescribed medication regimen will improve Outcome: Progressing   Problem: Nutritional: Goal: Ability to achieve adequate nutritional intake will improve Outcome: Progressing   Problem: Role Relationship: Goal: Ability to communicate  needs accurately will improve Outcome: Progressing Goal: Ability to interact with others will improve Outcome: Progressing   Problem: Safety: Goal: Ability to redirect hostility and anger into socially appropriate behaviors will improve Outcome: Progressing Goal: Ability to remain free from injury will improve Outcome: Progressing   Problem: Self-Care: Goal: Ability to participate in self-care as condition permits will improve Outcome: Progressing   Problem: Self-Concept: Goal: Will verbalize positive feelings about self Outcome: Progressing

## 2022-08-18 NOTE — Group Note (Signed)
LCSW Group Therapy Note   Group Date: 08/18/2022 Start Time: 1300 End Time: 1330   Type of Therapy and Topic:  Group Therapy: Stress Management  Participation Level:  Did Not Attend    Summary of Patient Progress:  The patient did not attend group.     Ketzia Guzek S Santoria Chason, LCSWA 08/18/2022  2:50 PM    

## 2022-08-19 DIAGNOSIS — F203 Undifferentiated schizophrenia: Secondary | ICD-10-CM | POA: Diagnosis not present

## 2022-08-19 NOTE — Group Note (Signed)
Recreation Therapy Group Note   Group Topic:Emotion Expression  Group Date: 08/19/2022 Start Time: 1000 End Time: 1040 Facilitators: Rosina Lowenstein, LRT, CTRS Location:  Craft Room  Group Description: Gratitude Journaling. Patients and LRT discussed what gratitude means, how we can express it and what it means to [place], personally. LRT gave an education handout on the definition of gratitude that also gave different examples of gratitude exercises that they could try. One of the examples was "Gratitude Letter", which prompted you to write a letter to someone you appreciate. LRT played soft music while everyone wrote their letter. Once letter was completed, LRT encouraged people to read their letter, if they wanted to, or share who they wrote it to, at minimum. LRT and pts processed how showing gratitude towards themselves, and others can be applied to everyday life post-discharge.   Goal Area(s) Addressed:  Patient will identify the definition of gratitude. Patient will learn different gratitude exercises. Patient will practice writing/journaling as a coping skill.   Affect/Mood: N/A   Participation Level: Did not attend    Clinical Observations/Individualized Feedback: Travis Sandoval did not attend group due to resting in his room.  Plan: Continue to engage patient in RT group sessions 2-3x/week.   Rosina Lowenstein, LRT, CTRS 08/19/2022 11:05 AM

## 2022-08-19 NOTE — Progress Notes (Signed)
[facility] MD Progress Note  08/19/2022 11:10 AM Travis Sandoval  MRN:  [id number] Subjective: Follow-up 42 year old man with schizophrenia.  Continues to stay isolated to his room most of the time.  Occasionally comes out to watch television and consistently comes out to eat but otherwise stays to himself.  Responds well to conversation.  Has no complaints.  No behavior problems. Principal Problem: Undifferentiated schizophrenia (HCC) Diagnosis: Principal Problem:   Undifferentiated schizophrenia (HCC)  Total Time spent with patient: 30 minutes  Past Psychiatric History: Past history of schizophrenia  Past Medical History:  Past Medical History:  Diagnosis Date   ADHD    Bipolar 1 disorder (HCC)    Hepatitis C    Schizoaffective disorder (HCC)    History reviewed. No pertinent surgical history. Family History:  Family History  Family history unknown: Yes   Family Psychiatric  History: See previous Social History:  Social History   Substance and Sexual Activity  Alcohol Use Not Currently     Social History   Substance and Sexual Activity  Drug Use Not Currently    Social History   Socioeconomic History   Marital status: Unknown    Spouse name: Not on file   Number of children: Not on file   Years of education: Not on file   Highest education level: Not on file  Occupational History   Not on file  Tobacco Use   Smoking status: Every Day    Packs/day: 0.25    Years: 15.00    Additional pack years: 0.00    Total pack years: 3.75    Types: Cigarettes   Smokeless tobacco: Not on file  Vaping Use   Vaping Use: Not on file  Substance and Sexual Activity   Alcohol use: Not Currently   Drug use: Not Currently   Sexual activity: Not Currently  Other Topics Concern   Not on file  Social History Narrative   Not on file   Social Determinants of Health   Financial Resource Strain: Not on file  Food Insecurity: Patient Declined (05/18/2022)   Hunger Vital Sign    Worried  About Running Out of Food in the Last Year: Patient declined    Ran Out of Food in the Last Year: Patient declined  Transportation Needs: Patient Declined (05/18/2022)   PRAPARE - Administrator, Civil Service (Medical): Patient declined    Lack of Transportation (Non-Medical): Patient declined  Physical Activity: Not on file  Stress: Not on file  Social Connections: Not on file   Additional Social History:                         Sleep: Fair  Appetite:  Fair  Current Medications: Current Facility-Administered Medications  Medication Dose Route Frequency Provider Last Rate Last Admin   acetaminophen (TYLENOL) tablet 650 mg  650 mg Oral Q6H PRN Gabriel Cirri F, NP   650 mg at 08/19/22 0801   alum & mag hydroxide-simeth (MAALOX/MYLANTA) 200-200-20 MG/5ML suspension 30 mL  30 mL Oral Q4H PRN Gabriel Cirri F, NP   30 mL at 08/18/22 2135   ARIPiprazole ER (ABILIFY MAINTENA) injection 400 mg  400 mg Intramuscular Q28 days Justin Meisenheimer T, MD   400 mg at 07/25/22 1501   cloZAPine (CLOZARIL) tablet 300 mg  300 mg Oral QHS Bonifacio Pruden T, MD   300 mg at 08/18/22 2135   docusate sodium (COLACE) capsule 200 mg  200 mg Oral BID  Lirio Bach, [name], MD   200 mg at 07/27/22 0939   famotidine (PEPCID) tablet 20 mg  20 mg Oral BID Margurite Duffy T, MD   20 mg at 07/20/22 1740   guaiFENesin (ROBITUSSIN) 100 MG/5ML liquid 5 mL  5 mL Oral Q4H PRN Severo Beber T, MD   5 mL at 08/19/22 0900   hydrOXYzine (ATARAX) tablet 50 mg  50 mg Oral Q6H PRN Camron Monday T, MD   50 mg at 08/10/22 2143   magnesium citrate solution 1 Bottle  1 Bottle Oral Daily PRN Jaynie Bream, RPH   1 Bottle at 07/19/22 1643   magnesium hydroxide (MILK OF MAGNESIA) suspension 30 mL  30 mL Oral Daily PRN Jaynie Bream, RPH   30 mL at 07/18/22 1718   mineral oil liquid   Oral Daily PRN Sarina Ill, DO   Given at 07/17/22 1201   nicotine polacrilex (NICORETTE) gum 2 mg  2 mg Oral PRN Javayah Magaw,  [name], MD   2 mg at 04/23/22 2200   polyethylene glycol (MIRALAX / GLYCOLAX) packet 17 g  17 g Oral Daily Kunal Levario, [name], MD   17 g at 08/19/22 0801   senna-docusate (Senokot-S) tablet 2 tablet  2 tablet Oral Daily PRN Jakaila Norment, [name], MD   2 tablet at 07/14/22 0809   ziprasidone (GEODON) injection 20 mg  20 mg Intramuscular Q12H PRN Reggie Pile, MD        Lab Results: No results found for this or any previous visit (from the past 48 hour(s)).  Blood Alcohol level:  Lab Results  Component Value Date   ETH <10 04/15/2022    Metabolic Disorder Labs: Lab Results  Component Value Date   HGBA1C 5.5 04/24/2022   MPG 111 04/24/2022   No results found for: "PROLACTIN" Lab Results  Component Value Date   CHOL 220 (H) 04/24/2022   TRIG 700 (H) 04/24/2022   HDL 35 (L) 04/24/2022   CHOLHDL 6.3 04/24/2022   VLDL UNABLE TO CALCULATE IF TRIGLYCERIDE OVER 400 mg/dL 16/01/9603   LDLCALC UNABLE TO CALCULATE IF TRIGLYCERIDE OVER 400 mg/dL 54/12/8117    Physical Findings: AIMS: Facial and Oral Movements Muscles of Facial Expression: None, normal Lips and Perioral Area: None, normal Jaw: None, normal Tongue: None, normal,Extremity Movements Upper (arms, wrists, hands, fingers): None, normal Lower (legs, knees, ankles, toes): None, normal, Trunk Movements Neck, shoulders, hips: None, normal, Overall Severity Severity of abnormal movements (highest score from questions above): None, normal Incapacitation due to abnormal movements: None, normal Patient's awareness of abnormal movements (rate only patient's report): No Awareness, Dental Status Current problems with teeth and/or dentures?: No Does patient usually wear dentures?: No  CIWA:    COWS:     Musculoskeletal: Strength & Muscle Tone: within normal limits Gait & Station: normal Patient leans: N/A  Psychiatric Specialty Exam:  Presentation  General Appearance:  Disheveled  Eye  Contact: Minimal  Speech: Pressured  Speech Volume: Normal  Handedness: Right   Mood and Affect  Mood: Euphoric; Irritable  Affect: Inappropriate   Thought Process  Thought Processes: Disorganized  Descriptions of Associations:Loose  Orientation:Partial  Thought Content:Illogical; Scattered  History of Schizophrenia/Schizoaffective disorder:No data recorded Duration of Psychotic Symptoms:No data recorded Hallucinations:No data recorded Ideas of Reference:Delusions; Paranoia  Suicidal Thoughts:No data recorded Homicidal Thoughts:No data recorded  Sensorium  Memory: Immediate Poor; Recent Poor; Remote Poor  Judgment: Poor  Insight: Poor   Executive Functions  Concentration: Fair  Attention Span: Fair  Recall: Poor  Fund of Knowledge: Poor  Language: Poor   Psychomotor Activity  Psychomotor Activity:No data recorded  Assets  Assets: Desire for Improvement; Social Support   Sleep  Sleep:No data recorded   Physical Exam: Physical Exam Vitals and nursing note reviewed.  Constitutional:      Appearance: Normal appearance.  HENT:     Head: Normocephalic and atraumatic.     Mouth/Throat:     Pharynx: Oropharynx is clear.  Eyes:     Pupils: Pupils are equal, round, and reactive to light.  Cardiovascular:     Rate and Rhythm: Normal rate and regular rhythm.  Pulmonary:     Effort: Pulmonary effort is normal.     Breath sounds: Normal breath sounds.  Abdominal:     General: Abdomen is flat.     Palpations: Abdomen is soft.  Musculoskeletal:        General: Normal range of motion.  Skin:    General: Skin is warm and dry.  Neurological:     General: No focal deficit present.     Mental Status: He is alert. Mental status is at baseline.  Psychiatric:        Attention and Perception: Attention normal.        Mood and Affect: Mood normal. Affect is blunt.        Speech: Speech normal.        Behavior: Behavior is withdrawn.         Thought Content: Thought content normal.    Review of Systems  Constitutional: Negative.   HENT: Negative.    Eyes: Negative.   Respiratory: Negative.    Cardiovascular: Negative.   Gastrointestinal: Negative.   Musculoskeletal: Negative.   Skin: Negative.   Neurological: Negative.   Psychiatric/Behavioral: Negative.     Blood pressure 114/72, pulse 95, temperature 97.6 F (36.4 C), temperature source Oral, resp. rate 18, height 5\' 9"  (1.753 m), weight 96.4 kg, SpO2 97 %. Body mass index is 31.38 kg/m.   Treatment Plan Summary: Medication management and Plan patient continues to be stable and as I mentioned before seems no better or worse at the lower dose of clozapine than the higher dose.  He continues to be here entirely because we are working on placement and are stuck with financial impediments.  Travis Rasmussen, MD 08/19/2022, 11:10 AM

## 2022-08-19 NOTE — Progress Notes (Signed)
[facility] LCSW Progress Note   08/19/2022    3:16 PM  Travis Sandoval   [id number]   Type of Contact and Topic:  Guardian Contact    CSW reached [facility] DSS regarding update on payee application. [name], DSS Guardian Supervisor, at [phone] [phone] reports the patient has been awarded [employer], although the social security office has not yet awarded an amount. CSW clarified if DSS has been appointed as the patient's payee at this time. [name] reports they have not been appointed as the payee yet. CSW encouraged DSS to apply or have someone appointed payee as soon as possible in order to avoid any delays once social security has determined an amount.    [id number]    Signed:  Corky Crafts, MSW, LCSW, LCAS 08/19/2022 3:16 PM

## 2022-08-19 NOTE — Progress Notes (Signed)
Patient calm and pleasant during assessment denying SI/HI/AVH. Pt compliant with medication administration per MD orders. Pt given education, support, and encouragement to be active in his treatment plan. Pt being monitored Q 15 minutes for safety per unit protocol, remains safe on the unit  

## 2022-08-19 NOTE — Plan of Care (Signed)
  Problem: Education: Goal: Knowledge of General Education information will improve Description: Including pain rating scale, medication(s)/side effects and non-pharmacologic comfort measures Outcome: Progressing   Problem: Nutrition: Goal: Adequate nutrition will be maintained Outcome: Progressing   Problem: Coping: Goal: Level of anxiety will decrease Outcome: Progressing   Problem: Activity: Goal: Risk for activity intolerance will decrease Outcome: Not Progressing   

## 2022-08-19 NOTE — Group Note (Signed)
[facility] LCSW Group Therapy Note    Group Date: 08/19/2022 Start Time: 1300 End Time: 1400  Type of Therapy and Topic:  Group Therapy:  Overcoming Obstacles  Participation Level:  [facility] PARTICIPATION LEVEL: Did Not Attend  Mood:  Description of Group:   In this group patients will be encouraged to explore what they see as obstacles to their own wellness and recovery. They will be guided to discuss their thoughts, feelings, and behaviors related to these obstacles. The group will process together ways to cope with barriers, with attention given to specific choices patients can make. Each patient will be challenged to identify changes they are motivated to make in order to overcome their obstacles. This group will be process-oriented, with patients participating in exploration of their own experiences as well as giving and receiving support and challenge from other group members.  Therapeutic Goals: 1. Patient will identify personal and current obstacles as they relate to admission. 2. Patient will identify barriers that currently interfere with their wellness or overcoming obstacles.  3. Patient will identify feelings, thought process and behaviors related to these barriers. 4. Patient will identify two changes they are willing to make to overcome these obstacles:    Summary of Patient Progress Patient did not attend group despite encouraged participation.     Therapeutic Modalities:   Cognitive Behavioral Therapy Solution Focused Therapy Motivational Interviewing Relapse Prevention Therapy   Corky Crafts, [place]

## 2022-08-19 NOTE — Progress Notes (Signed)
Patient alert and oriented x 4, affect is flat thoughts are organized and coherent, he denies SI/HI/AVH, she iinteracting appropriately with peers and staff. 15 minuets safety checks maintained.

## 2022-08-19 NOTE — Progress Notes (Signed)
Pt denies SI/HI/AVH and verbally agrees to approach staff if these become apparent or before harming themselves/others. Rates depression 0/10. Rates anxiety 0/10. Rates pain 1/10.  Scheduled medications administered to pt, per MD orders. RN provided support and encouragement to pt. Q15 min safety checks implemented and continued. Pt is safe on the unit. Plan of care on going and no other concerns expressed at this time.  08/19/22 0900  Psych Admission Type (Psych Patients Only)  Admission Status Voluntary  Psychosocial Assessment  Patient Complaints None  Eye Contact Fair  Facial Expression Flat  Affect Appropriate to circumstance  Speech Logical/coherent  Interaction Assertive;Isolative  Motor Activity Slow;Shuffling  Appearance/Hygiene In scrubs;Improved  Behavior Characteristics Cooperative;Appropriate to situation;Calm  Mood Pleasant  Aggressive Behavior  Effect No apparent injury  Thought Process  Coherency Circumstantial  Content WDL  Delusions WDL  Perception WDL  Hallucination None reported or observed  Judgment WDL  Confusion None  Danger to Self  Current suicidal ideation? Denies  Danger to Others  Danger to Others None reported or observed

## 2022-08-20 DIAGNOSIS — F203 Undifferentiated schizophrenia: Secondary | ICD-10-CM | POA: Diagnosis not present

## 2022-08-20 NOTE — Plan of Care (Signed)
  Problem: Education: Goal: Knowledge of General Education information will improve Description: Including pain rating scale, medication(s)/side effects and non-pharmacologic comfort measures Outcome: Progressing   Problem: Health Behavior/Discharge Planning: Goal: Ability to manage health-related needs will improve Outcome: Progressing   Problem: Clinical Measurements: Goal: Ability to maintain clinical measurements within normal limits will improve Outcome: Progressing Goal: Will remain free from infection Outcome: Progressing Goal: Diagnostic test results will improve Outcome: Progressing Goal: Respiratory complications will improve Outcome: Progressing Goal: Cardiovascular complication will be avoided Outcome: Progressing   Problem: Activity: Goal: Risk for activity intolerance will decrease Outcome: Progressing   Problem: Nutrition: Goal: Adequate nutrition will be maintained Outcome: Progressing   Problem: Coping: Goal: Level of anxiety will decrease Outcome: Progressing   Problem: Elimination: Goal: Will not experience complications related to urinary retention Outcome: Progressing   Problem: Pain Managment: Goal: General experience of comfort will improve Outcome: Progressing   Problem: Safety: Goal: Ability to remain free from injury will improve Outcome: Progressing   Problem: Skin Integrity: Goal: Risk for impaired skin integrity will decrease Outcome: Progressing   Problem: Education: Goal: Knowledge of [place] [facility] Education information/materials will improve Outcome: Progressing Goal: Emotional status will improve Outcome: Progressing Goal: Mental status will improve Outcome: Progressing Goal: Verbalization of understanding the information provided will improve Outcome: Progressing   Problem: Activity: Goal: Interest or engagement in activities will improve Outcome: Progressing Goal: Sleeping patterns will improve Outcome:  Progressing   Problem: Coping: Goal: Ability to verbalize frustrations and anger appropriately will improve Outcome: Progressing Goal: Ability to demonstrate self-control will improve Outcome: Progressing   Problem: Health Behavior/Discharge Planning: Goal: Identification of resources available to assist in meeting health care needs will improve Outcome: Progressing Goal: Compliance with treatment plan for underlying cause of condition will improve Outcome: Progressing   Problem: Physical Regulation: Goal: Ability to maintain clinical measurements within normal limits will improve Outcome: Progressing   Problem: Safety: Goal: Periods of time without injury will increase Outcome: Progressing   Problem: Activity: Goal: Will verbalize the importance of balancing activity with adequate rest periods Outcome: Progressing   Problem: Education: Goal: Will be free of psychotic symptoms Outcome: Progressing Goal: Knowledge of the prescribed therapeutic regimen will improve Outcome: Progressing   Problem: Coping: Goal: Coping ability will improve Outcome: Progressing Goal: Will verbalize feelings Outcome: Progressing   Problem: Health Behavior/Discharge Planning: Goal: Compliance with prescribed medication regimen will improve Outcome: Progressing   Problem: Nutritional: Goal: Ability to achieve adequate nutritional intake will improve Outcome: Progressing   Problem: Role Relationship: Goal: Ability to communicate needs accurately will improve Outcome: Progressing Goal: Ability to interact with others will improve Outcome: Progressing   Problem: Safety: Goal: Ability to redirect hostility and anger into socially appropriate behaviors will improve Outcome: Progressing Goal: Ability to remain free from injury will improve Outcome: Progressing   Problem: Self-Care: Goal: Ability to participate in self-care as condition permits will improve Outcome: Progressing    Problem: Self-Concept: Goal: Will verbalize positive feelings about self Outcome: Progressing   

## 2022-08-20 NOTE — Progress Notes (Signed)
D- Patient alert and oriented x 3. Affect flat/mood preoccupied. Denies SI/ HI/ AVH. Patient denies pain. He denies depression and anxiety. He states "I am just here waiting for discharge". A- Scheduled medications administered to patient, per MD orders except Colace and Pepcid- state they "constipate him". Support and encouragement provided.  Routine safety checks conducted every 15 minutes without incident.  Patient informed to notify staff with problems or concerns and verbalizes understanding. R- No adverse drug reactions noted.  Patient compliant with Clozaril treatment plan of treatment. Patient receptive, calm cooperative but isolates to his room during the day except for meals.  Patient contracts for safety and  remains safe on the unit at this time.

## 2022-08-20 NOTE — BH IP Treatment Plan (Signed)
Interdisciplinary Treatment and Diagnostic Plan Update  08/20/2022 Time of Session: 0830 Travis Sandoval MRN: [id number]  Principal Diagnosis: Undifferentiated schizophrenia [facility])  Secondary Diagnoses: Principal Problem:   Undifferentiated schizophrenia (HCC)   Current Medications:  Current Facility-Administered Medications  Medication Dose Route Frequency Provider Last Rate Last Admin   acetaminophen (TYLENOL) tablet 650 mg  650 mg Oral Q6H PRN Gabriel Cirri F, NP   650 mg at 08/19/22 0801   alum & mag hydroxide-simeth (MAALOX/MYLANTA) 200-200-20 MG/5ML suspension 30 mL  30 mL Oral Q4H PRN Gabriel Cirri F, NP   30 mL at 08/19/22 2115   ARIPiprazole ER (ABILIFY MAINTENA) injection 400 mg  400 mg Intramuscular Q28 days [name], [name], MD   400 mg at 07/25/22 1501   cloZAPine (CLOZARIL) tablet 300 mg  300 mg Oral QHS [name], [name], MD   300 mg at 08/19/22 2115   docusate sodium (COLACE) capsule 200 mg  200 mg Oral BID [name], [name], MD   200 mg at 07/27/22 0939   famotidine (PEPCID) tablet 20 mg  20 mg Oral BID [name], [name], MD   20 mg at 07/20/22 1740   guaiFENesin (ROBITUSSIN) 100 MG/5ML liquid 5 mL  5 mL Oral Q4H PRN [name], [name], MD   5 mL at 08/19/22 2115   hydrOXYzine (ATARAX) tablet 50 mg  50 mg Oral Q6H PRN [name], [name], MD   50 mg at 08/10/22 2143   magnesium citrate solution 1 Bottle  1 Bottle Oral Daily PRN Jaynie Bream, RPH   1 Bottle at 07/19/22 1643   magnesium hydroxide (MILK OF MAGNESIA) suspension 30 mL  30 mL Oral Daily PRN Jaynie Bream, RPH   30 mL at 07/18/22 1718   mineral oil liquid   Oral Daily PRN Sarina Ill, DO   Given at 07/17/22 1201   nicotine polacrilex (NICORETTE) gum 2 mg  2 mg Oral PRN [name], [name], MD   2 mg at 04/23/22 2200   polyethylene glycol (MIRALAX / GLYCOLAX) packet 17 g  17 g Oral Daily [name], [name], MD   17 g at 08/20/22 0754   senna-docusate (Senokot-S) tablet 2 tablet  2 tablet Oral Daily PRN  [name], [name], MD   2 tablet at 07/14/22 0809   ziprasidone (GEODON) injection 20 mg  20 mg Intramuscular Q12H PRN Reggie Pile, MD       PTA Medications: Medications Prior to Admission  Medication Sig Dispense Refill Last Dose   ABILIFY MAINTENA 400 MG SRER injection Inject 400 mg into the muscle every 28 (twenty-eight) days.      ARIPiprazole (ABILIFY) 10 MG tablet Take 10 mg by mouth daily. (Patient not taking: Reported on 04/16/2022)      atomoxetine (STRATTERA) 40 MG capsule Take 40 mg by mouth daily. (Patient not taking: Reported on 04/16/2022)      atorvastatin (LIPITOR) 40 MG tablet Take 40 mg by mouth daily.      budesonide-formoterol (SYMBICORT) 160-4.5 MCG/ACT inhaler Inhale 2 puffs into the lungs.      cetirizine (ZYRTEC) 10 MG tablet Take 10 mg by mouth daily.      clozapine (CLOZARIL) 200 MG tablet Take 200 mg by mouth 2 (two) times daily. Take along with one 25 mg tablet for total 225 mg twice daily      cloZAPine (CLOZARIL) 25 MG tablet Take 25 mg by mouth 2 (two) times daily. Take along with one 200 mg tablet for total  225 mg twice daily      Dexlansoprazole (DEXILANT) 30 MG capsule Take 30 mg by mouth daily. (Patient not taking: Reported on 04/16/2022)      hydrOXYzine (VISTARIL) 50 MG capsule Take 50 mg by mouth 3 (three) times daily as needed for itching. (Patient not taking: Reported on 04/16/2022)      Melatonin 5 MG TABS Take 5 mg by mouth at bedtime as needed (sleep).      metoprolol succinate (TOPROL-XL) 25 MG 24 hr tablet Take 12.5 mg by mouth daily.      Phosphatidyl Choline 65 MG TABS Take 1 tablet by mouth daily.  (Patient not taking: Reported on 04/16/2022)      valproic acid (DEPAKENE) 250 MG/5ML solution Take 750-1,000 mLs by mouth 2 (two) times daily. 1000 mg (20 mL) every morning and 750 mg (15 mL) daily at bedtime       Patient Stressors: Other: Psychosis    Patient Strengths: Ability for insight  Active sense of humor  Average or above average  intelligence  Capable of independent living  Supportive family/friends   Treatment Modalities: Medication Management, Group therapy, Case management,  1 to 1 session with clinician, Psychoeducation, Recreational therapy.   Physician Treatment Plan for Primary Diagnosis: Undifferentiated schizophrenia (HCC) Long Term Goal(s):     Short Term Goals: None, pt is a poor hx.  Medication Management: Evaluate patient's response, side effects, and tolerance of medication regimen.  Therapeutic Interventions: 1 to 1 sessions, Unit Group sessions and Medication administration.  Evaluation of Outcomes: Adequate for Discharge  Physician Treatment Plan for Secondary Diagnosis: Principal Problem:   Undifferentiated schizophrenia (HCC)  Long Term Goal(s):     Short Term Goals: None, pt is a poor hx.     Medication Management: Evaluate patient's response, side effects, and tolerance of medication regimen.  Therapeutic Interventions: 1 to 1 sessions, Unit Group sessions and Medication administration.  Evaluation of Outcomes: Adequate for Discharge   RN Treatment Plan for Primary Diagnosis: Undifferentiated schizophrenia (HCC) Long Term Goal(s): Knowledge of disease and therapeutic regimen to maintain health will improve  Short Term Goals: Ability to remain free from injury will improve, Ability to verbalize frustration and anger appropriately will improve, Ability to demonstrate self-control, Ability to participate in decision making will improve, Ability to verbalize feelings will improve, Ability to disclose and discuss suicidal ideas, Ability to identify and develop effective coping behaviors will improve, and Compliance with prescribed medications will improve  Medication Management: RN will administer medications as ordered by provider, will assess and evaluate patient's response and provide education to patient for prescribed medication. RN will report any adverse and/or side effects to  prescribing provider.  Therapeutic Interventions: 1 on 1 counseling sessions, Psychoeducation, Medication administration, Evaluate responses to treatment, Monitor vital signs and CBGs as ordered, Perform/monitor CIWA, COWS, AIMS and Fall Risk screenings as ordered, Perform wound care treatments as ordered.  Evaluation of Outcomes: Adequate for Discharge   LCSW Treatment Plan for Primary Diagnosis: Undifferentiated schizophrenia (HCC) Long Term Goal(s): Safe transition to appropriate next level of care at discharge, Engage patient in therapeutic group addressing interpersonal concerns.  Short Term Goals: Engage patient in aftercare planning with referrals and resources, Increase social support, Increase ability to appropriately verbalize feelings, Increase emotional regulation, Facilitate acceptance of mental health diagnosis and concerns, Facilitate patient progression through stages of change regarding substance use diagnoses and concerns, Identify triggers associated with mental health/substance abuse issues, and Increase skills for wellness and recovery  Therapeutic  Interventions: Assess for all discharge needs, 1 to 1 time with Child psychotherapist, Explore available resources and support systems, Assess for adequacy in community support network, Educate family and significant other(s) on suicide prevention, Complete Psychosocial Assessment, Interpersonal group therapy.  Evaluation of Outcomes: Adequate for Discharge   Progress in Treatment: Attending groups: No. Participating in groups: No. Taking medication as prescribed: Yes. Toleration medication: Yes. Family/Significant other contact made: Yes, individual(s) contacted:   Geovannie Vilar, father, [phone] Patient understands diagnosis: No. Discussing patient identified problems/goals with staff: Yes. Medical problems stabilized or resolved: Yes. Denies suicidal/homicidal ideation: Yes. Issues/concerns per patient self-inventory:  Yes. Other: none  New problem(s) identified: No, Describe:  none  New Short Term/Long Term Goal(s): Patient to work towards elimination of symptoms of psychosis, medication management for mood stabilization; development of comprehensive mental wellness plan.  Patient Goals:   No additional goals identified at this time. Patient to continue to work towards original goals identified in initial treatment team meeting. CSW will remain available to patient should they voice additional treatment goals.   Discharge Plan or Barriers: Patient lack funding to procure group home placement. Per note on 4/29, "CSW reached [facility] DSS regarding update on payee application. [name], DSS Guardian Supervisor, at [phone] ext. [phone] reports the patient has been awarded [employer], although the social security office has not yet awarded an amount. CSW clarified if DSS has been appointed as the patient's payee at this time. [name] reports they have not been appointed as the payee yet. CSW encouraged DSS to apply or have someone appointed payee as soon as possible in order to avoid any delays once social security has determined an amount."  Reason for Continuation of Hospitalization: Patient is boarding.   Estimated Length of Stay: TBD  Last 3 [place] Suicide Severity Risk Score: Flowsheet Row Admission (Current) from 04/16/2022 in [facility]ICINE ED from 04/15/2022 in [facility] Emergency Department at [facility]  C-SSRS RISK CATEGORY No Risk No Risk       Scribe for Treatment Team: Almedia Balls 08/20/2022 9:12 AM

## 2022-08-20 NOTE — Progress Notes (Signed)
[facility] MD Progress Note  08/20/2022 12:10 PM Travis Sandoval  MRN:  [id number] Subjective: Follow-up patient with undifferentiated schizophrenia.  Continues to stay isolated most of the time.  No change in behavior no new complaints Principal Problem: Undifferentiated schizophrenia (HCC) Diagnosis: Principal Problem:   Undifferentiated schizophrenia (HCC)  Total Time spent with patient: 30 minutes  Past Psychiatric History: Past history of schizophrenia with lengthy hospitalizations  Past Medical History:  Past Medical History:  Diagnosis Date   ADHD    Bipolar 1 disorder (HCC)    Hepatitis C    Schizoaffective disorder (HCC)    History reviewed. No pertinent surgical history. Family History:  Family History  Family history unknown: Yes   Family Psychiatric  History: See previous Social History:  Social History   Substance and Sexual Activity  Alcohol Use Not Currently     Social History   Substance and Sexual Activity  Drug Use Not Currently    Social History   Socioeconomic History   Marital status: Unknown    Spouse name: Not on file   Number of children: Not on file   Years of education: Not on file   Highest education level: Not on file  Occupational History   Not on file  Tobacco Use   Smoking status: Every Day    Packs/day: 0.25    Years: 15.00    Additional pack years: 0.00    Total pack years: 3.75    Types: Cigarettes   Smokeless tobacco: Not on file  Vaping Use   Vaping Use: Not on file  Substance and Sexual Activity   Alcohol use: Not Currently   Drug use: Not Currently   Sexual activity: Not Currently  Other Topics Concern   Not on file  Social History Narrative   Not on file   Social Determinants of Health   Financial Resource Strain: Not on file  Food Insecurity: Patient Declined (05/18/2022)   Hunger Vital Sign    Worried About Running Out of Food in the Last Year: Patient declined    Ran Out of Food in the Last Year: Patient declined   Transportation Needs: Patient Declined (05/18/2022)   PRAPARE - Administrator, Civil Service (Medical): Patient declined    Lack of Transportation (Non-Medical): Patient declined  Physical Activity: Not on file  Stress: Not on file  Social Connections: Not on file   Additional Social History:                         Sleep: Fair  Appetite:  Fair  Current Medications: Current Facility-Administered Medications  Medication Dose Route Frequency Provider Last Rate Last Admin   acetaminophen (TYLENOL) tablet 650 mg  650 mg Oral Q6H PRN Gabriel Cirri F, NP   650 mg at 08/19/22 0801   alum & mag hydroxide-simeth (MAALOX/MYLANTA) 200-200-20 MG/5ML suspension 30 mL  30 mL Oral Q4H PRN Gabriel Cirri F, NP   30 mL at 08/19/22 2115   ARIPiprazole ER (ABILIFY MAINTENA) injection 400 mg  400 mg Intramuscular Q28 days Makyle Eslick T, MD   400 mg at 07/25/22 1501   cloZAPine (CLOZARIL) tablet 300 mg  300 mg Oral QHS Arielle Eber T, MD   300 mg at 08/19/22 2115   docusate sodium (COLACE) capsule 200 mg  200 mg Oral BID Logann Whitebread T, MD   200 mg at 07/27/22 0939   famotidine (PEPCID) tablet 20 mg  20 mg Oral BID  Keyondre Hepburn, [name], MD   20 mg at 07/20/22 1740   guaiFENesin (ROBITUSSIN) 100 MG/5ML liquid 5 mL  5 mL Oral Q4H PRN Natalyah Cummiskey, [name], MD   5 mL at 08/19/22 2115   hydrOXYzine (ATARAX) tablet 50 mg  50 mg Oral Q6H PRN Lucretia Pendley, [name], MD   50 mg at 08/10/22 2143   magnesium citrate solution 1 Bottle  1 Bottle Oral Daily PRN Jaynie Bream, RPH   1 Bottle at 07/19/22 1643   magnesium hydroxide (MILK OF MAGNESIA) suspension 30 mL  30 mL Oral Daily PRN Jaynie Bream, RPH   30 mL at 07/18/22 1718   mineral oil liquid   Oral Daily PRN Sarina Ill, DO   Given at 07/17/22 1201   nicotine polacrilex (NICORETTE) gum 2 mg  2 mg Oral PRN Asianna Brundage, [name], MD   2 mg at 04/23/22 2200   polyethylene glycol (MIRALAX / GLYCOLAX) packet 17 g  17 g Oral Daily  Layton Tappan, [name], MD   17 g at 08/20/22 0754   senna-docusate (Senokot-S) tablet 2 tablet  2 tablet Oral Daily PRN Cavon Nicolls, [name], MD   2 tablet at 07/14/22 0809   ziprasidone (GEODON) injection 20 mg  20 mg Intramuscular Q12H PRN Reggie Pile, MD        Lab Results: No results found for this or any previous visit (from the past 48 hour(s)).  Blood Alcohol level:  Lab Results  Component Value Date   ETH <10 04/15/2022    Metabolic Disorder Labs: Lab Results  Component Value Date   HGBA1C 5.5 04/24/2022   MPG 111 04/24/2022   No results found for: "PROLACTIN" Lab Results  Component Value Date   CHOL 220 (H) 04/24/2022   TRIG 700 (H) 04/24/2022   HDL 35 (L) 04/24/2022   CHOLHDL 6.3 04/24/2022   VLDL UNABLE TO CALCULATE IF TRIGLYCERIDE OVER 400 mg/dL 16/01/9603   LDLCALC UNABLE TO CALCULATE IF TRIGLYCERIDE OVER 400 mg/dL 54/12/8117    Physical Findings: AIMS: Facial and Oral Movements Muscles of Facial Expression: None, normal Lips and Perioral Area: None, normal Jaw: None, normal Tongue: None, normal,Extremity Movements Upper (arms, wrists, hands, fingers): None, normal Lower (legs, knees, ankles, toes): None, normal, Trunk Movements Neck, shoulders, hips: None, normal, Overall Severity Severity of abnormal movements (highest score from questions above): None, normal Incapacitation due to abnormal movements: None, normal Patient's awareness of abnormal movements (rate only patient's report): No Awareness, Dental Status Current problems with teeth and/or dentures?: No Does patient usually wear dentures?: No  CIWA:    COWS:     Musculoskeletal: Strength & Muscle Tone: within normal limits Gait & Station: normal Patient leans: N/A  Psychiatric Specialty Exam:  Presentation  General Appearance:  Disheveled  Eye Contact: Minimal  Speech: Pressured  Speech Volume: Normal  Handedness: Right   Mood and Affect  Mood: Euphoric;  Irritable  Affect: Inappropriate   Thought Process  Thought Processes: Disorganized  Descriptions of Associations:Loose  Orientation:Partial  Thought Content:Illogical; Scattered  History of Schizophrenia/Schizoaffective disorder:No data recorded Duration of Psychotic Symptoms:No data recorded Hallucinations:No data recorded Ideas of Reference:Delusions; Paranoia  Suicidal Thoughts:No data recorded Homicidal Thoughts:No data recorded  Sensorium  Memory: Immediate Poor; Recent Poor; Remote Poor  Judgment: Poor  Insight: Poor   Executive Functions  Concentration: Fair  Attention Span: Fair  Recall: Poor  Fund of Knowledge: Poor  Language: Poor   Psychomotor Activity  Psychomotor Activity:No data recorded  Assets  Assets: Desire for Improvement; Social Support   Sleep  Sleep:No data recorded   Physical Exam: Physical Exam Vitals and nursing note reviewed.  Constitutional:      Appearance: Normal appearance.  HENT:     Head: Normocephalic and atraumatic.     Mouth/Throat:     Pharynx: Oropharynx is clear.  Eyes:     Pupils: Pupils are equal, round, and reactive to light.  Cardiovascular:     Rate and Rhythm: Normal rate and regular rhythm.  Pulmonary:     Effort: Pulmonary effort is normal.     Breath sounds: Normal breath sounds.  Abdominal:     General: Abdomen is flat.     Palpations: Abdomen is soft.  Musculoskeletal:        General: Normal range of motion.  Skin:    General: Skin is warm and dry.  Neurological:     General: No focal deficit present.     Mental Status: He is alert. Mental status is at baseline.  Psychiatric:        Attention and Perception: He is inattentive.        Mood and Affect: Mood normal. Affect is blunt.        Speech: Speech is delayed.        Behavior: Behavior is slowed.        Thought Content: Thought content normal.        Cognition and Memory: Cognition is impaired.    Review of Systems   Constitutional: Negative.   HENT: Negative.    Eyes: Negative.   Respiratory: Negative.    Cardiovascular: Negative.   Gastrointestinal: Negative.   Musculoskeletal: Negative.   Skin: Negative.   Neurological: Negative.   Psychiatric/Behavioral: Negative.     Blood pressure 125/77, pulse 100, temperature 97.9 F (36.6 C), temperature source Oral, resp. rate 20, height 5\' 9"  (1.753 m), weight 96.4 kg, SpO2 97 %. Body mass index is 31.38 kg/m.   Treatment Plan Summary: Medication management and Plan no change to medication management.  Appears to be fully stable and simply waiting for the finances to be arranged for placement  Mordecai Rasmussen, MD 08/20/2022, 12:10 PM

## 2022-08-20 NOTE — Progress Notes (Signed)
Patient with complaints of indigestion and cough, requested tylenol for cough and congestion to head off cold. Denies being in any pain. Flat affect. Denies Si, HI, AVH. Medication compliant.  Encouragement and support provided. Safety checks maintained. Meds given as prescribed. Pt receptive and remains safe on unit with q 15 min checks.

## 2022-08-20 NOTE — Group Note (Signed)
Recreation Therapy Group Note   Group Topic:Coping Skills  Group Date: 08/20/2022 Start Time: 1000 End Time: 1035 Facilitators: Clinton Gallant, CTRS Location:  Craft Room  Group Description: Mind Map.  Patient was provided a blank template of a diagram with 32 blank boxes in a tiered system, branching from the center (similar to a bubble chart). LRT directed patients to label the middle of the diagram "Coping Skills". LRT and patients then came up with 8 different coping skills as examples. Pt were directed to record their coping skills in the 2nd tier boxes closest to the center.  Patients would then share their coping skills with the group as LRT wrote them out. LRT gave a handout of 100 different coping skills at the end of group.   Goal Area(s) Addressed: Patients will be able to define "coping skills". Patient will identify new coping skills.  Patient will identify new possible leisure interests.   Affect/Mood: N/A   Participation Level: Did not attend    Clinical Observations/Individualized Feedback: Travis Sandoval did not attend group due to resting in his room.  Plan: Continue to engage patient in RT group sessions 2-3x/week.   Travis Sandoval, LRT, CTRS 08/20/2022 11:03 AM

## 2022-08-20 NOTE — Group Note (Signed)
[facility] LCSW Group Therapy Note   Group Date: 08/20/2022 Start Time: 1300 End Time: 1325  Type of Therapy/Topic:  Group Therapy:  Feelings about Diagnosis  Participation Level:  Did Not Attend    Description of Group:    This group will allow patients to explore their thoughts and feelings about diagnoses they have received. Patients will be guided to explore their level of understanding and acceptance of these diagnoses. Facilitator will encourage patients to process their thoughts and feelings about the reactions of others to their diagnosis, and will guide patients in identifying ways to discuss their diagnosis with significant others in their lives. This group will be process-oriented, with patients participating in exploration of their own experiences as well as giving and receiving support and challenge from other group members.   Therapeutic Goals: 1. Patient will demonstrate understanding of diagnosis as evidence by identifying two or more symptoms of the disorder:  2. Patient will be able to express two feelings regarding the diagnosis 3. Patient will demonstrate ability to communicate their needs through discussion and/or role plays  Summary of Patient Progress: X   Therapeutic Modalities:   Cognitive Behavioral Therapy Brief Therapy Feelings Identification    Seanmichael Salmons R Anjelina Dung, LCSW 

## 2022-08-20 NOTE — Plan of Care (Signed)
  Problem: Education: Goal: Knowledge of General Education information will improve Description: Including pain rating scale, medication(s)/side effects and non-pharmacologic comfort measures Outcome: Progressing   Problem: Coping: Goal: Level of anxiety will decrease Outcome: Progressing   Problem: Safety: Goal: Ability to remain free from injury will improve Outcome: Progressing   Problem: Education: Goal: Emotional status will improve Outcome: Progressing   Problem: Activity: Goal: Sleeping patterns will improve Outcome: Progressing   Problem: Coping: Goal: Ability to verbalize frustrations and anger appropriately will improve Outcome: Progressing

## 2022-08-21 LAB — CBC WITH DIFFERENTIAL/PLATELET
Abs Immature Granulocytes: 0.06 10*3/uL (ref 0.00–0.07)
Basophils Absolute: 0 10*3/uL (ref 0.0–0.1)
Basophils Relative: 1 %
Eosinophils Absolute: 0.2 10*3/uL (ref 0.0–0.5)
Eosinophils Relative: 2 %
HCT: 44.4 % (ref 39.0–52.0)
Hemoglobin: 14.8 g/dL (ref 13.0–17.0)
Immature Granulocytes: 1 %
Lymphocytes Relative: 35 %
Lymphs Abs: 2.6 10*3/uL (ref 0.7–4.0)
MCH: 28.4 pg (ref 26.0–34.0)
MCHC: 33.3 g/dL (ref 30.0–36.0)
MCV: 85.1 fL (ref 80.0–100.0)
Monocytes Absolute: 0.4 10*3/uL (ref 0.1–1.0)
Monocytes Relative: 6 %
Neutro Abs: 4.2 10*3/uL (ref 1.7–7.7)
Neutrophils Relative %: 55 %
Platelets: 198 10*3/uL (ref 150–400)
RBC: 5.22 MIL/uL (ref 4.22–5.81)
RDW: 13.1 % (ref 11.5–15.5)
WBC: 7.5 10*3/uL (ref 4.0–10.5)
nRBC: 0 % (ref 0.0–0.2)

## 2022-08-21 NOTE — Group Note (Signed)
Date:  08/21/2022 Time:  9:31 PM  Group Topic/Focus:  Personal Choices and Values:   The focus of this group is to help patients assess and explore the importance of values in their lives, how their values affect their decisions, how they express their values and what opposes their expression.    Participation Level:  Did Not Attend    Additional Comments:  Patient did not attend group.  Dallie Piles Melba Araki 08/21/2022, 9:31 PM

## 2022-08-21 NOTE — Progress Notes (Signed)
D- Patient alert and oriented x 3-4. Affect flat/mood preoccupied. Denies SI/ HI/ AVH. Patient denies pain. Patient denies depression and anxiety. He states "I am just waiting on discharge". He asked where he would get "his mail" will refer to Social Work. A- Scheduled medications administered to patient, per MD orders except for Pepcid and Colace. Support and encouragement provided.  Routine safety checks conducted every 15 minutes without incident.  Patient informed to notify staff with problems or concerns and verbalizes understanding. R- No adverse drug reactions noted.  Patient compliant with Clozaril and treatment plan. Patient receptive, calm and  cooperative. He isolates to his room except for meals. Patient contracts for safety and  remains safe on the unit at this time.

## 2022-08-21 NOTE — Progress Notes (Signed)
[facility] MD Progress Note  08/21/2022 10:22 AM Travis Sandoval  MRN:  [id number] Subjective: Follow-up for this 42 year old man with schizophrenia.  No new complaints.  Still has a mild cough but has indicated that he is feeling better.  Not otherwise feeling sick.  No mental health complaints no behavior problems Principal Problem: Undifferentiated schizophrenia (HCC) Diagnosis: Principal Problem:   Undifferentiated schizophrenia (HCC)  Total Time spent with patient: 30 minutes  Past Psychiatric History: Past history of schizophrenia with lengthy hospitalizations  Past Medical History:  Past Medical History:  Diagnosis Date   ADHD    Bipolar 1 disorder (HCC)    Hepatitis C    Schizoaffective disorder (HCC)    History reviewed. No pertinent surgical history. Family History:  Family History  Family history unknown: Yes   Family Psychiatric  History: See previous Social History:  Social History   Substance and Sexual Activity  Alcohol Use Not Currently     Social History   Substance and Sexual Activity  Drug Use Not Currently    Social History   Socioeconomic History   Marital status: Unknown    Spouse name: Not on file   Number of children: Not on file   Years of education: Not on file   Highest education level: Not on file  Occupational History   Not on file  Tobacco Use   Smoking status: Every Day    Packs/day: 0.25    Years: 15.00    Additional pack years: 0.00    Total pack years: 3.75    Types: Cigarettes   Smokeless tobacco: Not on file  Vaping Use   Vaping Use: Not on file  Substance and Sexual Activity   Alcohol use: Not Currently   Drug use: Not Currently   Sexual activity: Not Currently  Other Topics Concern   Not on file  Social History Narrative   Not on file   Social Determinants of Health   Financial Resource Strain: Not on file  Food Insecurity: Patient Declined (05/18/2022)   Hunger Vital Sign    Worried About Running Out of Food in the  Last Year: Patient declined    Ran Out of Food in the Last Year: Patient declined  Transportation Needs: Patient Declined (05/18/2022)   PRAPARE - Administrator, Civil Service (Medical): Patient declined    Lack of Transportation (Non-Medical): Patient declined  Physical Activity: Not on file  Stress: Not on file  Social Connections: Not on file   Additional Social History:                         Sleep: Fair  Appetite:  Fair  Current Medications: Current Facility-Administered Medications  Medication Dose Route Frequency Provider Last Rate Last Admin   acetaminophen (TYLENOL) tablet 650 mg  650 mg Oral Q6H PRN Gabriel Cirri F, NP   650 mg at 08/20/22 2057   alum & mag hydroxide-simeth (MAALOX/MYLANTA) 200-200-20 MG/5ML suspension 30 mL  30 mL Oral Q4H PRN Gabriel Cirri F, NP   30 mL at 08/20/22 2057   ARIPiprazole ER (ABILIFY MAINTENA) injection 400 mg  400 mg Intramuscular Q28 days Warrick Llera T, MD   400 mg at 07/25/22 1501   cloZAPine (CLOZARIL) tablet 300 mg  300 mg Oral QHS Caelie Remsburg T, MD   300 mg at 08/20/22 2057   docusate sodium (COLACE) capsule 200 mg  200 mg Oral BID Jaquin Coy, [name], MD   200  mg at 07/27/22 0939   famotidine (PEPCID) tablet 20 mg  20 mg Oral BID Suellen Durocher, [name], MD   20 mg at 07/20/22 1740   guaiFENesin (ROBITUSSIN) 100 MG/5ML liquid 5 mL  5 mL Oral Q4H PRN Nil Bolser, [name], MD   5 mL at 08/20/22 2057   hydrOXYzine (ATARAX) tablet 50 mg  50 mg Oral Q6H PRN Muneeb Veras, [name], MD   50 mg at 08/10/22 2143   magnesium citrate solution 1 Bottle  1 Bottle Oral Daily PRN Jaynie Bream, RPH   1 Bottle at 07/19/22 1643   magnesium hydroxide (MILK OF MAGNESIA) suspension 30 mL  30 mL Oral Daily PRN Jaynie Bream, RPH   30 mL at 07/18/22 1718   mineral oil liquid   Oral Daily PRN Sarina Ill, DO   Given at 07/17/22 1201   nicotine polacrilex (NICORETTE) gum 2 mg  2 mg Oral PRN Exavier Lina, [name], MD   2 mg at 04/23/22  2200   polyethylene glycol (MIRALAX / GLYCOLAX) packet 17 g  17 g Oral Daily Markeria Goetsch, [name], MD   17 g at 08/21/22 1004   senna-docusate (Senokot-S) tablet 2 tablet  2 tablet Oral Daily PRN Arpi Diebold, [name], MD   2 tablet at 07/14/22 0809   ziprasidone (GEODON) injection 20 mg  20 mg Intramuscular Q12H PRN Reggie Pile, MD        Lab Results:  Results for orders placed or performed during the hospital encounter of 04/16/22 (from the past 48 hour(s))  CBC with Differential/Platelet     Status: None   Collection Time: 08/21/22  8:47 AM  Result Value Ref Range   WBC 7.5 4.0 - 10.5 K/uL   RBC 5.22 4.22 - 5.81 MIL/uL   Hemoglobin 14.8 13.0 - 17.0 g/dL   HCT 16.1 09.6 - 04.5 %   MCV 85.1 80.0 - 100.0 fL   MCH 28.4 26.0 - 34.0 pg   MCHC 33.3 30.0 - 36.0 g/dL   RDW 40.9 81.1 - 91.4 %   Platelets 198 150 - 400 K/uL   nRBC 0.0 0.0 - 0.2 %   Neutrophils Relative % 55 %   Neutro Abs 4.2 1.7 - 7.7 K/uL   Lymphocytes Relative 35 %   Lymphs Abs 2.6 0.7 - 4.0 K/uL   Monocytes Relative 6 %   Monocytes Absolute 0.4 0.1 - 1.0 K/uL   Eosinophils Relative 2 %   Eosinophils Absolute 0.2 0.0 - 0.5 K/uL   Basophils Relative 1 %   Basophils Absolute 0.0 0.0 - 0.1 K/uL   Immature Granulocytes 1 %   Abs Immature Granulocytes 0.06 0.00 - 0.07 K/uL    Comment: Performed at [facility], [address] [place], [place] [address]    Blood Alcohol level:  Lab Results  Component Value Date   [facility] <10 04/15/2022    Metabolic Disorder Labs: Lab Results  Component Value Date   HGBA1C 5.5 04/24/2022   MPG 111 04/24/2022   No results found for: "PROLACTIN" Lab Results  Component Value Date   CHOL 220 (H) 04/24/2022   TRIG 700 (H) 04/24/2022   HDL 35 (L) 04/24/2022   CHOLHDL 6.3 04/24/2022   VLDL UNABLE TO CALCULATE IF TRIGLYCERIDE OVER 400 mg/dL 62/13/0865   LDLCALC UNABLE TO CALCULATE IF TRIGLYCERIDE OVER 400 mg/dL 78/46/9629    Physical Findings: AIMS: Facial and Oral  Movements Muscles of Facial Expression: None, normal Lips and Perioral Area: None, normal  Jaw: None, normal Tongue: None, normal,Extremity Movements Upper (arms, wrists, hands, fingers): None, normal Lower (legs, knees, ankles, toes): None, normal, Trunk Movements Neck, shoulders, hips: None, normal, Overall Severity Severity of abnormal movements (highest score from questions above): None, normal Incapacitation due to abnormal movements: None, normal Patient's awareness of abnormal movements (rate only patient's report): No Awareness, Dental Status Current problems with teeth and/or dentures?: No Does patient usually wear dentures?: No  CIWA:    COWS:     Musculoskeletal: Strength & Muscle Tone: within normal limits Gait & Station: normal Patient leans: N/A  Psychiatric Specialty Exam:  Presentation  General Appearance:  Disheveled  Eye Contact: Minimal  Speech: Pressured  Speech Volume: Normal  Handedness: Right   Mood and Affect  Mood: Euphoric; Irritable  Affect: Inappropriate   Thought Process  Thought Processes: Disorganized  Descriptions of Associations:Loose  Orientation:Partial  Thought Content:Illogical; Scattered  History of Schizophrenia/Schizoaffective disorder:No data recorded Duration of Psychotic Symptoms:No data recorded Hallucinations:No data recorded Ideas of Reference:Delusions; Paranoia  Suicidal Thoughts:No data recorded Homicidal Thoughts:No data recorded  Sensorium  Memory: Immediate Poor; Recent Poor; Remote Poor  Judgment: Poor  Insight: Poor   Executive Functions  Concentration: Fair  Attention Span: Fair  Recall: Poor  Fund of Knowledge: Poor  Language: Poor   Psychomotor Activity  Psychomotor Activity:No data recorded  Assets  Assets: Desire for Improvement; Social Support   Sleep  Sleep:No data recorded   Physical Exam: Physical Exam Vitals and nursing note reviewed.   Constitutional:      Appearance: Normal appearance.  HENT:     Head: Normocephalic and atraumatic.     Mouth/Throat:     Pharynx: Oropharynx is clear.  Eyes:     Pupils: Pupils are equal, round, and reactive to light.  Cardiovascular:     Rate and Rhythm: Normal rate and regular rhythm.  Pulmonary:     Effort: Pulmonary effort is normal.     Breath sounds: Normal breath sounds.  Abdominal:     General: Abdomen is flat.     Palpations: Abdomen is soft.  Musculoskeletal:        General: Normal range of motion.  Skin:    General: Skin is warm and dry.  Neurological:     General: No focal deficit present.     Mental Status: He is alert. Mental status is at baseline.  Psychiatric:        Attention and Perception: Attention normal.        Mood and Affect: Mood normal. Affect is blunt.        Speech: Speech is delayed.        Behavior: Behavior is slowed.        Thought Content: Thought content normal.        Cognition and Memory: Cognition normal.    Review of Systems  Constitutional: Negative.   HENT: Negative.    Eyes: Negative.   Respiratory:  Positive for cough.   Cardiovascular: Negative.   Gastrointestinal: Negative.   Musculoskeletal: Negative.   Skin: Negative.   Neurological: Negative.   Psychiatric/Behavioral: Negative.     Blood pressure 97/74, pulse (!) 108, temperature 98 F (36.7 C), temperature source Oral, resp. rate (!) 21, height 5\' 9"  (1.753 m), weight 96.4 kg, SpO2 95 %. Body mass index is 31.38 kg/m.   Treatment Plan Summary: Medication management and Plan no change to medication.  Patient appears to continue to be stable.  With encouragement he got up out of  bed and went outdoors a little bit today which is always a good sign.  Mainly we continue to focus on discharge planning  Mordecai Rasmussen, MD 08/21/2022, 10:22 AM

## 2022-08-21 NOTE — Group Note (Signed)
Recreation Therapy Group Note   Group Topic:Leisure Education  Group Date: 08/21/2022 Start Time: 1000 End Time: 1100 Facilitators: Rosina Lowenstein, LRT, CTRS Location: Courtyard  Group Description: Leisure. Patients were given the option to choose from listening to music, playing basketball, drawing with sidewalk chalk, or playing with a deck of cards. LRT and pts discussed the importance of participating in leisure during their free time and when they're outside of the hospital. Pt identified two leisure interests and shared with the group.   Goal Area(s) Addressed:  Patient will identify a current leisure interest.  Patient will practice making a positive decision. Patient will have the opportunity to try a new leisure activity.  Affect/Mood: N/A   Participation Level: Minimal    Clinical Observations/Individualized Feedback: Nolyn came outside for less than 5 minutes before going back inside. Pt did not interact with anyone.   Plan: Continue to engage patient in RT group sessions 2-3x/week.   Rosina Lowenstein, LRT, CTRS 08/21/2022 11:17 AM

## 2022-08-21 NOTE — Consult Note (Signed)
Pharmacy Consult - Clozapine     42 yo male  This patient's order has been reviewed for prescribing contraindications.    Clozapine [facility]S enrollment Verified: yes on 04/17/22 [facility]S patient ID: [id number] Current Outpatient Monitoring: Every 2 weeks   Home Regimen:  225 mg po BID Last dose: unknown   Dose Adjustments This Admission: Dose started at clozapine 100 mg po daily Dose is currently clozapine 350 mg po HS (started 3/12) > 300mg  qHS 08/04/2022   Labs: Date    ANC    Submitted? 12/27 2800 Yes 01/03 3500 Yes 01/10   3100 Yes 01/24 3000 Yes 01/31 4400 yes   02/07 4300 yes 02/14 4500 Yes 02/21 5500 Yes 02/28 4080 Yes 03/06 4300 Yes  03/13 4100 Yes 03/20 4800 Yes 03/27 4800 Yes 04/03 4200 Yes 04/10 5400 Yes 04/17 3570 Yes 04/24 4000 Yes 05/01 4200  Plan: Continue clozapine 300 mg po HS Monitor ANC at least weekly while inpatient. Next CBC with differential on 08/28/22.   Sharen Hones, PharmD, BCPS Clinical Pharmacist   08/21/2022 7:53 AM

## 2022-08-21 NOTE — Group Note (Signed)
[facility] LCSW Group Therapy Note   Group Date: 08/21/2022 Start Time: 1300 End Time: 1400   Type of Therapy/Topic:  Group Therapy:  Emotion Regulation  Participation Level:  Did Not Attend    Description of Group:    The purpose of this group is to assist patients in learning to regulate negative emotions and experience positive emotions. Patients will be guided to discuss ways in which they have been vulnerable to their negative emotions. These vulnerabilities will be juxtaposed with experiences of positive emotions or situations, and patients challenged to use positive emotions to combat negative ones. Special emphasis will be placed on coping with negative emotions in conflict situations, and patients will process healthy conflict resolution skills.  Therapeutic Goals: Patient will identify two positive emotions or experiences to reflect on in order to balance out negative emotions:  Patient will label two or more emotions that they find the most difficult to experience:  Patient will be able to demonstrate positive conflict resolution skills through discussion or role plays:   Summary of Patient Progress:   Patient did not attend group despite encouraged participation.     Therapeutic Modalities:   Cognitive Behavioral Therapy Feelings Identification Dialectical Behavioral Therapy   Corky Crafts, [place]

## 2022-08-22 NOTE — Progress Notes (Signed)
Patient calm and pleasant during assessment denying SI/HI/AVH. Pt compliant with medication administration per MD orders. Pt given education, support, and encouragement to be active in his treatment plan. Pt being monitored Q 15 minutes for safety per unit protocol, remains safe on the unit  

## 2022-08-22 NOTE — Group Note (Signed)
Date:  08/22/2022 Time:  9:54 PM  Group Topic/Focus:  Wrap-Up Group:   The focus of this group is to help patients review their daily goal of treatment and discuss progress on daily workbooks.    Participation Level:  Active  Participation Quality:  Appropriate and Attentive  Affect:  Appropriate  Cognitive:  Alert and Appropriate  Insight: Appropriate and Good  Engagement in Group:  Developing/Improving and Improving  Modes of Intervention:  Discussion and Support  Additional Comments:     Daphna Lafuente 08/22/2022, 9:54 PM

## 2022-08-22 NOTE — Progress Notes (Signed)
[facility] MD Progress Note  08/22/2022 12:57 PM Travis Sandoval  MRN:  [id number] Subjective: Follow-up patient with schizophrenia.  No new complaints.  No change in presentation.  As always he spends most of his time in bed but wakes up easily when I come in to talk with him and is pleasant. Principal Problem: Undifferentiated schizophrenia (HCC) Diagnosis: Principal Problem:   Undifferentiated schizophrenia (HCC)  Total Time spent with patient: 30 minutes  Past Psychiatric History: Past history of schizophrenia  Past Medical History:  Past Medical History:  Diagnosis Date   ADHD    Bipolar 1 disorder (HCC)    Hepatitis C    Schizoaffective disorder (HCC)    History reviewed. No pertinent surgical history. Family History:  Family History  Family history unknown: Yes   Family Psychiatric  History: See previous Social History:  Social History   Substance and Sexual Activity  Alcohol Use Not Currently     Social History   Substance and Sexual Activity  Drug Use Not Currently    Social History   Socioeconomic History   Marital status: Unknown    Spouse name: Not on file   Number of children: Not on file   Years of education: Not on file   Highest education level: Not on file  Occupational History   Not on file  Tobacco Use   Smoking status: Every Day    Packs/day: 0.25    Years: 15.00    Additional pack years: 0.00    Total pack years: 3.75    Types: Cigarettes   Smokeless tobacco: Not on file  Vaping Use   Vaping Use: Not on file  Substance and Sexual Activity   Alcohol use: Not Currently   Drug use: Not Currently   Sexual activity: Not Currently  Other Topics Concern   Not on file  Social History Narrative   Not on file   Social Determinants of Health   Financial Resource Strain: Not on file  Food Insecurity: Patient Declined (05/18/2022)   Hunger Vital Sign    Worried About Running Out of Food in the Last Year: Patient declined    Ran Out of Food in the  Last Year: Patient declined  Transportation Needs: Patient Declined (05/18/2022)   PRAPARE - Administrator, Civil Service (Medical): Patient declined    Lack of Transportation (Non-Medical): Patient declined  Physical Activity: Not on file  Stress: Not on file  Social Connections: Not on file   Additional Social History:                         Sleep: Fair  Appetite:  Fair  Current Medications: Current Facility-Administered Medications  Medication Dose Route Frequency Provider Last Rate Last Admin   acetaminophen (TYLENOL) tablet 650 mg  650 mg Oral Q6H PRN Gabriel Cirri F, NP   650 mg at 08/21/22 2113   alum & mag hydroxide-simeth (MAALOX/MYLANTA) 200-200-20 MG/5ML suspension 30 mL  30 mL Oral Q4H PRN Gabriel Cirri F, NP   30 mL at 08/21/22 2114   ARIPiprazole ER (ABILIFY MAINTENA) injection 400 mg  400 mg Intramuscular Q28 days Ellamae Lybeck T, MD   400 mg at 07/25/22 1501   cloZAPine (CLOZARIL) tablet 300 mg  300 mg Oral QHS Gracelee Stemmler T, MD   300 mg at 08/21/22 2113   docusate sodium (COLACE) capsule 200 mg  200 mg Oral BID Denym Rahimi, [name], MD   200 mg at  07/27/22 0939   famotidine (PEPCID) tablet 20 mg  20 mg Oral BID Maurica Omura T, MD   20 mg at 07/20/22 1740   guaiFENesin (ROBITUSSIN) 100 MG/5ML liquid 5 mL  5 mL Oral Q4H PRN Doyce Stonehouse, [name], MD   5 mL at 08/21/22 2113   hydrOXYzine (ATARAX) tablet 50 mg  50 mg Oral Q6H PRN Charmon Thorson, [name], MD   50 mg at 08/10/22 2143   magnesium citrate solution 1 Bottle  1 Bottle Oral Daily PRN Jaynie Bream, RPH   1 Bottle at 07/19/22 1643   magnesium hydroxide (MILK OF MAGNESIA) suspension 30 mL  30 mL Oral Daily PRN Jaynie Bream, RPH   30 mL at 07/18/22 1718   mineral oil liquid   Oral Daily PRN Sarina Ill, DO   Given at 07/17/22 1201   nicotine polacrilex (NICORETTE) gum 2 mg  2 mg Oral PRN Yariah Selvey, [name], MD   2 mg at 04/23/22 2200   polyethylene glycol (MIRALAX / GLYCOLAX) packet  17 g  17 g Oral Daily Ladoris Lythgoe, [name], MD   17 g at 08/22/22 0753   senna-docusate (Senokot-S) tablet 2 tablet  2 tablet Oral Daily PRN Chloe Flis, [name], MD   2 tablet at 07/14/22 0809   ziprasidone (GEODON) injection 20 mg  20 mg Intramuscular Q12H PRN Reggie Pile, MD        Lab Results:  Results for orders placed or performed during the hospital encounter of 04/16/22 (from the past 48 hour(s))  CBC with Differential/Platelet     Status: None   Collection Time: 08/21/22  8:47 AM  Result Value Ref Range   WBC 7.5 4.0 - 10.5 K/uL   RBC 5.22 4.22 - 5.81 MIL/uL   Hemoglobin 14.8 13.0 - 17.0 g/dL   HCT 09.8 11.9 - 14.7 %   MCV 85.1 80.0 - 100.0 fL   MCH 28.4 26.0 - 34.0 pg   MCHC 33.3 30.0 - 36.0 g/dL   RDW 82.9 56.2 - 13.0 %   Platelets 198 150 - 400 K/uL   nRBC 0.0 0.0 - 0.2 %   Neutrophils Relative % 55 %   Neutro Abs 4.2 1.7 - 7.7 K/uL   Lymphocytes Relative 35 %   Lymphs Abs 2.6 0.7 - 4.0 K/uL   Monocytes Relative 6 %   Monocytes Absolute 0.4 0.1 - 1.0 K/uL   Eosinophils Relative 2 %   Eosinophils Absolute 0.2 0.0 - 0.5 K/uL   Basophils Relative 1 %   Basophils Absolute 0.0 0.0 - 0.1 K/uL   Immature Granulocytes 1 %   Abs Immature Granulocytes 0.06 0.00 - 0.07 K/uL    Comment: Performed at [facility], [address] [place], [place] [address]    Blood Alcohol level:  Lab Results  Component Value Date   [facility] <10 04/15/2022    Metabolic Disorder Labs: Lab Results  Component Value Date   HGBA1C 5.5 04/24/2022   MPG 111 04/24/2022   No results found for: "PROLACTIN" Lab Results  Component Value Date   CHOL 220 (H) 04/24/2022   TRIG 700 (H) 04/24/2022   HDL 35 (L) 04/24/2022   CHOLHDL 6.3 04/24/2022   VLDL UNABLE TO CALCULATE IF TRIGLYCERIDE OVER 400 mg/dL 46/96/2952   LDLCALC UNABLE TO CALCULATE IF TRIGLYCERIDE OVER 400 mg/dL 84/13/2440    Physical Findings: AIMS: Facial and Oral Movements Muscles of Facial Expression: None, normal Lips and  Perioral Area: None, normal Jaw: None,  normal Tongue: None, normal,Extremity Movements Upper (arms, wrists, hands, fingers): None, normal Lower (legs, knees, ankles, toes): None, normal, Trunk Movements Neck, shoulders, hips: None, normal, Overall Severity Severity of abnormal movements (highest score from questions above): None, normal Incapacitation due to abnormal movements: None, normal Patient's awareness of abnormal movements (rate only patient's report): No Awareness, Dental Status Current problems with teeth and/or dentures?: No Does patient usually wear dentures?: No  CIWA:    COWS:     Musculoskeletal: Strength & Muscle Tone: within normal limits Gait & Station: normal Patient leans: N/A  Psychiatric Specialty Exam:  Presentation  General Appearance:  Disheveled  Eye Contact: Minimal  Speech: Pressured  Speech Volume: Normal  Handedness: Right   Mood and Affect  Mood: Euphoric; Irritable  Affect: Inappropriate   Thought Process  Thought Processes: Disorganized  Descriptions of Associations:Loose  Orientation:Partial  Thought Content:Illogical; Scattered  History of Schizophrenia/Schizoaffective disorder:No data recorded Duration of Psychotic Symptoms:No data recorded Hallucinations:No data recorded Ideas of Reference:Delusions; Paranoia  Suicidal Thoughts:No data recorded Homicidal Thoughts:No data recorded  Sensorium  Memory: Immediate Poor; Recent Poor; Remote Poor  Judgment: Poor  Insight: Poor   Executive Functions  Concentration: Fair  Attention Span: Fair  Recall: Poor  Fund of Knowledge: Poor  Language: Poor   Psychomotor Activity  Psychomotor Activity:No data recorded  Assets  Assets: Desire for Improvement; Social Support   Sleep  Sleep:No data recorded   Physical Exam: Physical Exam Vitals and nursing note reviewed.  Constitutional:      Appearance: Normal appearance.  HENT:     Head:  Normocephalic and atraumatic.     Mouth/Throat:     Pharynx: Oropharynx is clear.  Eyes:     Pupils: Pupils are equal, round, and reactive to light.  Cardiovascular:     Rate and Rhythm: Normal rate and regular rhythm.  Pulmonary:     Effort: Pulmonary effort is normal.     Breath sounds: Normal breath sounds.  Abdominal:     General: Abdomen is flat.     Palpations: Abdomen is soft.  Musculoskeletal:        General: Normal range of motion.  Skin:    General: Skin is warm and dry.  Neurological:     General: No focal deficit present.     Mental Status: He is alert. Mental status is at baseline.  Psychiatric:        Attention and Perception: Attention normal.        Mood and Affect: Mood normal.        Speech: Speech normal.        Behavior: Behavior is cooperative.        Thought Content: Thought content normal.        Cognition and Memory: Cognition normal.        Judgment: Judgment normal.    Review of Systems  Constitutional: Negative.   HENT: Negative.    Eyes: Negative.   Respiratory: Negative.    Cardiovascular: Negative.   Gastrointestinal: Negative.   Musculoskeletal: Negative.   Skin: Negative.   Neurological: Negative.   Psychiatric/Behavioral: Negative.     Blood pressure (!) 135/97, pulse 95, temperature 97.9 F (36.6 C), temperature source Oral, resp. rate 20, height 5\' 9"  (1.753 m), weight 96.4 kg, SpO2 96 %. Body mass index is 31.38 kg/m.   Treatment Plan Summary: Plan no change to medication.  Primarily still focused on discharge planning  Mordecai Rasmussen, MD 08/22/2022, 12:57 PM

## 2022-08-22 NOTE — Progress Notes (Signed)
   08/21/22 2115  Psych Admission Type (Psych Patients Only)  Admission Status Voluntary  Psychosocial Assessment  Patient Complaints Anxiety  Eye Contact Fair  Facial Expression Flat  Affect Flat  Speech Logical/coherent  Interaction Assertive  Motor Activity Slow  Appearance/Hygiene In scrubs  Behavior Characteristics Cooperative;Appropriate to situation  Mood Anxious;Pleasant  Thought Process  Coherency Circumstantial  Content WDL  Delusions None reported or observed  Perception WDL  Hallucination None reported or observed  Judgment UTA  Confusion None  Danger to Self  Current suicidal ideation? Denies  Agreement Not to Harm Self Yes  Description of Agreement verbal  Danger to Others  Danger to Others None reported or observed   Progress note   D: Pt seen in medication room. Pt denies SI, HI, AVH. Contracts for safety. Pt rates pain  0/10. Pt rates anxiety  8/10 and depression  0/10. Pt said he is anxious because he is waiting for something to come in the mail. Pt wants something for indigestion.  Endorses still having a productive cough and phlegm production. No other concerns noted at this time.  A: Pt provided support and encouragement. Pt given scheduled medication as prescribed. PRNs as appropriate. Q15 min checks for safety.   R: Pt safe on the unit. Will continue to monitor.

## 2022-08-22 NOTE — Progress Notes (Signed)
Patient denies SI, HI, and AVH. He isolates to his room, but came out for breakfast. Patient refused colace and pepcid this morning but took the Miralax. Patient reports last BM was on 5/1. Patient remains safe on the unit at this time.

## 2022-08-22 NOTE — Group Note (Signed)
LCSW Group Therapy Note  Group Date: 08/22/2022 Start Time: 1300 End Time: 1400   Type of Therapy and Topic:  Group Therapy - How To Cope with Nervousness about Discharge   Participation Level:  Did Not Attend   Description of Group This process group involved identification of patients' feelings about discharge. Some of them are scheduled to be discharged soon, while others are new admissions, but each of them was asked to share thoughts and feelings surrounding discharge from the hospital. One common theme was that they are excited at the prospect of going home, while another was that many of them are apprehensive about sharing why they were hospitalized. Patients were given the opportunity to discuss these feelings with their peers in preparation for discharge.  Therapeutic Goals  Patient will identify their overall feelings about pending discharge. Patient will think about how they might proactively address issues that they believe will once again arise once they get home (i.e. with parents). Patients will participate in discussion about having hope for change.   Summary of Patient Progress:  Patient did not attend group despite encouraged participation.    Therapeutic Modalities Cognitive Behavioral Therapy   Almedia Balls 08/22/2022  3:17 PM

## 2022-08-22 NOTE — Group Note (Signed)
Recreation Therapy Group Note   Group Topic:Health and Wellness  Group Date: 08/22/2022 Start Time: 1000 End Time: 1100 Facilitators: Rosina Lowenstein, LRT, CTRS Location: Courtyard  Group Description: [employer]. LRT and patients played games of basketball and drew with chalk while outside in the courtyard while getting fresh air and sunlight. Music was being played in the background. LRT and peers conversed about different games they have played before. LRT encouraged pts to drink water after being outside, sweating and getting their heart rate up.  Goal Area(s) Addressed: Patient will build on frustration tolerance skills. Patients will partake in a competitive play game with peers. Patients will gain knowledge of new leisure interest/hobby.   Affect/Mood: N/A   Participation Level: Did not attend    Clinical Observations/Individualized Feedback: Zabian did not attend group due to resting in his room.  Plan: Continue to engage patient in RT group sessions 2-3x/week.   Rosina Lowenstein, LRT, CTRS 08/22/2022 11:28 AM

## 2022-08-23 NOTE — Progress Notes (Signed)
I assumed care for Travis Sandoval at about 09:30. He was resting in his bed, easily wakes up when I called his name, hygiene remains poor, denied any avh/hi/si during my assessment, he has remained self isolative to his room all morning thus far, no behavioral problems. He is being monitored as ordered.

## 2022-08-23 NOTE — Progress Notes (Signed)
[facility] MD Progress Note  08/23/2022 11:06 AM Travis Sandoval  MRN:  [id number] Subjective: Follow-up for this patient with schizophrenia.  As always he spends almost all of his time in bed.  He asked me today if he was getting any mail at the hospital.  I told him that if any came to the hospital it should be brought to him and I was not aware of there being any.  He seems to think that maybe social security is going to send him a check here.  I had let him know today that we did get an update that he has been approved for social security but there is no report that they have actually dispersed any money.  In any case they would not be sending it to him right now but to a payee probably or to his guardian.  No other complaints.  No behavior problems Principal Problem: Undifferentiated schizophrenia (HCC) Diagnosis: Principal Problem:   Undifferentiated schizophrenia (HCC)  Total Time spent with patient: 30 minutes  Past Psychiatric History: Past history of schizophrenia  Past Medical History:  Past Medical History:  Diagnosis Date   ADHD    Bipolar 1 disorder (HCC)    Hepatitis C    Schizoaffective disorder (HCC)    History reviewed. No pertinent surgical history. Family History:  Family History  Family history unknown: Yes   Family Psychiatric  History: See previous Social History:  Social History   Substance and Sexual Activity  Alcohol Use Not Currently     Social History   Substance and Sexual Activity  Drug Use Not Currently    Social History   Socioeconomic History   Marital status: Unknown    Spouse name: Not on file   Number of children: Not on file   Years of education: Not on file   Highest education level: Not on file  Occupational History   Not on file  Tobacco Use   Smoking status: Every Day    Packs/day: 0.25    Years: 15.00    Additional pack years: 0.00    Total pack years: 3.75    Types: Cigarettes   Smokeless tobacco: Not on file  Vaping Use    Vaping Use: Not on file  Substance and Sexual Activity   Alcohol use: Not Currently   Drug use: Not Currently   Sexual activity: Not Currently  Other Topics Concern   Not on file  Social History Narrative   Not on file   Social Determinants of Health   Financial Resource Strain: Not on file  Food Insecurity: Patient Declined (05/18/2022)   Hunger Vital Sign    Worried About Running Out of Food in the Last Year: Patient declined    Ran Out of Food in the Last Year: Patient declined  Transportation Needs: Patient Declined (05/18/2022)   PRAPARE - Administrator, Civil Service (Medical): Patient declined    Lack of Transportation (Non-Medical): Patient declined  Physical Activity: Not on file  Stress: Not on file  Social Connections: Not on file   Additional Social History:                         Sleep: Fair  Appetite:  Fair  Current Medications: Current Facility-Administered Medications  Medication Dose Route Frequency Provider Last Rate Last Admin   acetaminophen (TYLENOL) tablet 650 mg  650 mg Oral Q6H PRN Vanetta Mulders, NP   650 mg at 08/22/22 2110  alum & mag hydroxide-simeth (MAALOX/MYLANTA) 200-200-20 MG/5ML suspension 30 mL  30 mL Oral Q4H PRN Gabriel Cirri F, NP   30 mL at 08/22/22 2110   ARIPiprazole ER (ABILIFY MAINTENA) injection 400 mg  400 mg Intramuscular Q28 days Johnetta Sloniker T, MD   400 mg at 08/22/22 1615   cloZAPine (CLOZARIL) tablet 300 mg  300 mg Oral QHS Matther Labell T, MD   300 mg at 08/22/22 2110   docusate sodium (COLACE) capsule 200 mg  200 mg Oral BID Tiegan Terpstra T, MD   200 mg at 07/27/22 0939   famotidine (PEPCID) tablet 20 mg  20 mg Oral BID Jennifermarie Franzen T, MD   20 mg at 07/20/22 1740   guaiFENesin (ROBITUSSIN) 100 MG/5ML liquid 5 mL  5 mL Oral Q4H PRN Rebeca Valdivia T, MD   5 mL at 08/22/22 2110   hydrOXYzine (ATARAX) tablet 50 mg  50 mg Oral Q6H PRN Maurie Musco T, MD   50 mg at 08/10/22 2143   magnesium citrate  solution 1 Bottle  1 Bottle Oral Daily PRN Jaynie Bream, RPH   1 Bottle at 07/19/22 1643   magnesium hydroxide (MILK OF MAGNESIA) suspension 30 mL  30 mL Oral Daily PRN Jaynie Bream, RPH   30 mL at 07/18/22 1718   mineral oil liquid   Oral Daily PRN Sarina Ill, DO   Given at 07/17/22 1201   nicotine polacrilex (NICORETTE) gum 2 mg  2 mg Oral PRN Milliani Herrada, [name], MD   2 mg at 04/23/22 2200   polyethylene glycol (MIRALAX / GLYCOLAX) packet 17 g  17 g Oral Daily Draedyn Weidinger, [name], MD   17 g at 08/22/22 0753   senna-docusate (Senokot-S) tablet 2 tablet  2 tablet Oral Daily PRN Gurshaan Matsuoka, [name], MD   2 tablet at 07/14/22 0809   ziprasidone (GEODON) injection 20 mg  20 mg Intramuscular Q12H PRN Reggie Pile, MD        Lab Results: No results found for this or any previous visit (from the past 48 hour(s)).  Blood Alcohol level:  Lab Results  Component Value Date   ETH <10 04/15/2022    Metabolic Disorder Labs: Lab Results  Component Value Date   HGBA1C 5.5 04/24/2022   MPG 111 04/24/2022   No results found for: "PROLACTIN" Lab Results  Component Value Date   CHOL 220 (H) 04/24/2022   TRIG 700 (H) 04/24/2022   HDL 35 (L) 04/24/2022   CHOLHDL 6.3 04/24/2022   VLDL UNABLE TO CALCULATE IF TRIGLYCERIDE OVER 400 mg/dL 29/56/2130   LDLCALC UNABLE TO CALCULATE IF TRIGLYCERIDE OVER 400 mg/dL 86/57/8469    Physical Findings: AIMS: Facial and Oral Movements Muscles of Facial Expression: None, normal Lips and Perioral Area: None, normal Jaw: None, normal Tongue: None, normal,Extremity Movements Upper (arms, wrists, hands, fingers): None, normal Lower (legs, knees, ankles, toes): None, normal, Trunk Movements Neck, shoulders, hips: None, normal, Overall Severity Severity of abnormal movements (highest score from questions above): None, normal Incapacitation due to abnormal movements: None, normal Patient's awareness of abnormal movements (rate only patient's report): No  Awareness, Dental Status Current problems with teeth and/or dentures?: No Does patient usually wear dentures?: No  CIWA:    COWS:     Musculoskeletal: Strength & Muscle Tone: within normal limits Gait & Station: normal Patient leans: N/A  Psychiatric Specialty Exam:  Presentation  General Appearance:  Disheveled  Eye Contact: Minimal  Speech: Pressured  Speech Volume: Normal  Handedness: Right   Mood and Affect  Mood: Euphoric; Irritable  Affect: Inappropriate   Thought Process  Thought Processes: Disorganized  Descriptions of Associations:Loose  Orientation:Partial  Thought Content:Illogical; Scattered  History of Schizophrenia/Schizoaffective disorder:No data recorded Duration of Psychotic Symptoms:No data recorded Hallucinations:No data recorded Ideas of Reference:Delusions; Paranoia  Suicidal Thoughts:No data recorded Homicidal Thoughts:No data recorded  Sensorium  Memory: Immediate Poor; Recent Poor; Remote Poor  Judgment: Poor  Insight: Poor   Executive Functions  Concentration: Fair  Attention Span: Fair  Recall: Poor  Fund of Knowledge: Poor  Language: Poor   Psychomotor Activity  Psychomotor Activity:No data recorded  Assets  Assets: Desire for Improvement; Social Support   Sleep  Sleep:No data recorded   Physical Exam: Physical Exam Vitals reviewed.  Constitutional:      Appearance: Normal appearance.  HENT:     Head: Normocephalic and atraumatic.     Mouth/Throat:     Pharynx: Oropharynx is clear.  Eyes:     Pupils: Pupils are equal, round, and reactive to light.  Cardiovascular:     Rate and Rhythm: Normal rate and regular rhythm.  Pulmonary:     Effort: Pulmonary effort is normal.     Breath sounds: Normal breath sounds.  Abdominal:     General: Abdomen is flat.     Palpations: Abdomen is soft.  Musculoskeletal:        General: Normal range of motion.  Skin:    General: Skin is warm and  dry.  Neurological:     General: No focal deficit present.     Mental Status: He is alert. Mental status is at baseline.  Psychiatric:        Attention and Perception: He is inattentive.        Mood and Affect: Mood normal. Affect is blunt.        Speech: Speech normal.        Behavior: Behavior is cooperative.        Thought Content: Thought content normal.        Cognition and Memory: Cognition is impaired.    Review of Systems  Constitutional: Negative.   HENT: Negative.    Eyes: Negative.   Respiratory: Negative.    Cardiovascular: Negative.   Gastrointestinal: Negative.   Musculoskeletal: Negative.   Skin: Negative.   Neurological: Negative.   Psychiatric/Behavioral: Negative.     Blood pressure 125/77, pulse 100, temperature 98 F (36.7 C), temperature source Oral, resp. rate 18, height 5\' 9"  (1.753 m), weight 96.4 kg, SpO2 98 %. Body mass index is 31.38 kg/m.   Treatment Plan Summary: Medication management and Plan no change to medication.  Encourage patient to come outside for recreation time and interact with others and to maintain his hygiene.  Otherwise as mentioned and found out that social security seems to have taken one step towards getting him his money but there is probably still a long way to go.  Mordecai Rasmussen, MD 08/23/2022, 11:06 AM

## 2022-08-23 NOTE — Progress Notes (Signed)
Pt denies SI/HI/AVH and verbally agrees to approach staff if these become apparent or before harming themselves/others. Rates depression 0/10. Rates anxiety 0/10. Rates pain 0/10. When doing rounds, the pt was heard responding to internal stimuli. Pt was speaking rapidly. RN had a hard time understanding what the pt was stating. Scheduled medications administered to pt, per MD orders. RN provided support and encouragement to pt. Q15 min safety checks implemented and continued. Pt is safe on the unit. Plan of care on going and no other concerns expressed at this time.  08/23/22 0819  Psych Admission Type (Psych Patients Only)  Admission Status Voluntary  Psychosocial Assessment  Patient Complaints None  Eye Contact Fair  Facial Expression Flat  Affect Flat  Speech Logical/coherent  Interaction Assertive  Motor Activity Slow  Appearance/Hygiene Unremarkable  Behavior Characteristics Cooperative;Appropriate to situation;Calm  Mood Pleasant  Aggressive Behavior  Effect No apparent injury  Thought Process  Coherency Circumstantial  Content WDL  Delusions None reported or observed  Perception Hallucinations  Hallucination Auditory  Judgment Impaired  Confusion None  Danger to Self  Current suicidal ideation? Denies  Danger to Others  Danger to Others None reported or observed

## 2022-08-23 NOTE — Group Note (Signed)
Recreation Therapy Group Note   Group Topic:Other  Group Date: 08/23/2022 Start Time: 1000 End Time: 1100 Facilitators: Rosina Lowenstein, LRT, CTRS Location: Courtyard  Group Description: Outdoor Recreation. Patients had the option to play basketball, draw with sidewalk chalk, play with a deck of cards or listen their favorite songs while outside in the courtyard getting fresh air and sunlight. LRT played music in the background through a speaker. LRT and pts discussed things that they enjoy doing in their free time outside of the hospital.  Goal Area(s) Addressed: Patient will identify leisure interests.  Patient will practice healthy decision making. Patient will engage in recreation activity.  Affect/Mood: N/A   Participation Level: Did not attend    Clinical Observations/Individualized Feedback: Ralphel did not attend group due to resting in his room.  Plan: Continue to engage patient in RT group sessions 2-3x/week.   Rosina Lowenstein, LRT, CTRS 08/23/2022 11:49 AM

## 2022-08-24 NOTE — Group Note (Signed)
LCSW Group Therapy Note   Group Date: 08/24/2022 Start Time: 1315 End Time: 1350   Type of Therapy and Topic:  Group Therapy: Build A Support System   Participation Level:  Did Not Attend  Summary of Patient Progress:  The patient did not attend group.   Marshell Levan, LCSWA 08/24/2022  1:55 PM

## 2022-08-24 NOTE — Plan of Care (Signed)
  Problem: Education: Goal: Knowledge of General Education information will improve Description: Including pain rating scale, medication(s)/side effects and non-pharmacologic comfort measures Outcome: Progressing   Problem: Health Behavior/Discharge Planning: Goal: Ability to manage health-related needs will improve Outcome: Progressing   Problem: Clinical Measurements: Goal: Ability to maintain clinical measurements within normal limits will improve Outcome: Progressing Goal: Will remain free from infection Outcome: Progressing Goal: Diagnostic test results will improve Outcome: Progressing Goal: Respiratory complications will improve Outcome: Progressing Goal: Cardiovascular complication will be avoided Outcome: Progressing   Problem: Activity: Goal: Risk for activity intolerance will decrease Outcome: Progressing   Problem: Nutrition: Goal: Adequate nutrition will be maintained Outcome: Progressing   Problem: Coping: Goal: Level of anxiety will decrease Outcome: Progressing   Problem: Elimination: Goal: Will not experience complications related to urinary retention Outcome: Progressing   Problem: Pain Managment: Goal: General experience of comfort will improve Outcome: Progressing   Problem: Safety: Goal: Ability to remain free from injury will improve Outcome: Progressing   Problem: Skin Integrity: Goal: Risk for impaired skin integrity will decrease Outcome: Progressing   Problem: Education: Goal: Knowledge of [place] [facility] Education information/materials will improve Outcome: Progressing Goal: Emotional status will improve Outcome: Progressing Goal: Mental status will improve Outcome: Progressing Goal: Verbalization of understanding the information provided will improve Outcome: Progressing   Problem: Activity: Goal: Interest or engagement in activities will improve Outcome: Progressing Goal: Sleeping patterns will improve Outcome:  Progressing   Problem: Coping: Goal: Ability to verbalize frustrations and anger appropriately will improve Outcome: Progressing Goal: Ability to demonstrate self-control will improve Outcome: Progressing   Problem: Health Behavior/Discharge Planning: Goal: Identification of resources available to assist in meeting health care needs will improve Outcome: Progressing Goal: Compliance with treatment plan for underlying cause of condition will improve Outcome: Progressing   Problem: Physical Regulation: Goal: Ability to maintain clinical measurements within normal limits will improve Outcome: Progressing   Problem: Safety: Goal: Periods of time without injury will increase Outcome: Progressing   Problem: Activity: Goal: Will verbalize the importance of balancing activity with adequate rest periods Outcome: Progressing   Problem: Education: Goal: Will be free of psychotic symptoms Outcome: Progressing Goal: Knowledge of the prescribed therapeutic regimen will improve Outcome: Progressing   Problem: Coping: Goal: Coping ability will improve Outcome: Progressing Goal: Will verbalize feelings Outcome: Progressing   Problem: Health Behavior/Discharge Planning: Goal: Compliance with prescribed medication regimen will improve Outcome: Progressing   Problem: Nutritional: Goal: Ability to achieve adequate nutritional intake will improve Outcome: Progressing   Problem: Role Relationship: Goal: Ability to communicate needs accurately will improve Outcome: Progressing Goal: Ability to interact with others will improve Outcome: Progressing   Problem: Safety: Goal: Ability to redirect hostility and anger into socially appropriate behaviors will improve Outcome: Progressing Goal: Ability to remain free from injury will improve Outcome: Progressing   Problem: Self-Care: Goal: Ability to participate in self-care as condition permits will improve Outcome: Progressing    Problem: Self-Concept: Goal: Will verbalize positive feelings about self Outcome: Progressing   

## 2022-08-24 NOTE — Progress Notes (Signed)
[facility] MD Progress Note  08/24/2022 12:20 PM Travis Sandoval  MRN:  [id number] Subjective: Terald is seen on rounds.  He has no complaints.  Nurses report no issues.  No side effects from his medications and he gives me a thumbs up and states he is doing well. Principal Problem: Undifferentiated schizophrenia (HCC) Diagnosis: Principal Problem:   Undifferentiated schizophrenia (HCC)  Total Time spent with patient: 15 minutes  Past Psychiatric History: History of schizophrenia  Past Medical History:  Past Medical History:  Diagnosis Date   ADHD    Bipolar 1 disorder (HCC)    Hepatitis C    Schizoaffective disorder (HCC)    History reviewed. No pertinent surgical history. Family History:  Family History  Family history unknown: Yes   Family Psychiatric  History: Unremarkable Social History:  Social History   Substance and Sexual Activity  Alcohol Use Not Currently     Social History   Substance and Sexual Activity  Drug Use Not Currently    Social History   Socioeconomic History   Marital status: Unknown    Spouse name: Not on file   Number of children: Not on file   Years of education: Not on file   Highest education level: Not on file  Occupational History   Not on file  Tobacco Use   Smoking status: Every Day    Packs/day: 0.25    Years: 15.00    Additional pack years: 0.00    Total pack years: 3.75    Types: Cigarettes   Smokeless tobacco: Not on file  Vaping Use   Vaping Use: Not on file  Substance and Sexual Activity   Alcohol use: Not Currently   Drug use: Not Currently   Sexual activity: Not Currently  Other Topics Concern   Not on file  Social History Narrative   Not on file   Social Determinants of Health   Financial Resource Strain: Not on file  Food Insecurity: Patient Declined (05/18/2022)   Hunger Vital Sign    Worried About Running Out of Food in the Last Year: Patient declined    Ran Out of Food in the Last Year: Patient declined   Transportation Needs: Patient Declined (05/18/2022)   PRAPARE - Administrator, Civil Service (Medical): Patient declined    Lack of Transportation (Non-Medical): Patient declined  Physical Activity: Not on file  Stress: Not on file  Social Connections: Not on file   Additional Social History:                         Sleep: Good  Appetite:  Good  Current Medications: Current Facility-Administered Medications  Medication Dose Route Frequency Provider Last Rate Last Admin   acetaminophen (TYLENOL) tablet 650 mg  650 mg Oral Q6H PRN Gabriel Cirri F, NP   650 mg at 08/22/22 2110   alum & mag hydroxide-simeth (MAALOX/MYLANTA) 200-200-20 MG/5ML suspension 30 mL  30 mL Oral Q4H PRN Gabriel Cirri F, NP   30 mL at 08/22/22 2110   ARIPiprazole ER (ABILIFY MAINTENA) injection 400 mg  400 mg Intramuscular Q28 days [name], [name], MD   400 mg at 08/22/22 1615   cloZAPine (CLOZARIL) tablet 300 mg  300 mg Oral QHS [name], [name], MD   300 mg at 08/23/22 2137   docusate sodium (COLACE) capsule 200 mg  200 mg Oral BID [name], [name], MD   200 mg at 07/27/22 0939   famotidine (PEPCID)  tablet 20 mg  20 mg Oral BID [name], [name], MD   20 mg at 07/20/22 1740   guaiFENesin (ROBITUSSIN) 100 MG/5ML liquid 5 mL  5 mL Oral Q4H PRN [name], [name], MD   5 mL at 08/22/22 2110   hydrOXYzine (ATARAX) tablet 50 mg  50 mg Oral Q6H PRN [name], [name], MD   50 mg at 08/10/22 2143   magnesium citrate solution 1 Bottle  1 Bottle Oral Daily PRN Jaynie Bream, RPH   1 Bottle at 07/19/22 1643   magnesium hydroxide (MILK OF MAGNESIA) suspension 30 mL  30 mL Oral Daily PRN Jaynie Bream, RPH   30 mL at 07/18/22 1718   mineral oil liquid   Oral Daily PRN Sarina Ill, DO   Given at 07/17/22 1201   nicotine polacrilex (NICORETTE) gum 2 mg  2 mg Oral PRN [name], [name], MD   2 mg at 04/23/22 2200   polyethylene glycol (MIRALAX / GLYCOLAX) packet 17 g  17 g Oral Daily  [name], [name], MD   17 g at 08/24/22 1038   senna-docusate (Senokot-S) tablet 2 tablet  2 tablet Oral Daily PRN [name], [name], MD   2 tablet at 07/14/22 0809   ziprasidone (GEODON) injection 20 mg  20 mg Intramuscular Q12H PRN Reggie Pile, MD        Lab Results: No results found for this or any previous visit (from the past 48 hour(s)).  Blood Alcohol level:  Lab Results  Component Value Date   ETH <10 04/15/2022    Metabolic Disorder Labs: Lab Results  Component Value Date   HGBA1C 5.5 04/24/2022   MPG 111 04/24/2022   No results found for: "PROLACTIN" Lab Results  Component Value Date   CHOL 220 (H) 04/24/2022   TRIG 700 (H) 04/24/2022   HDL 35 (L) 04/24/2022   CHOLHDL 6.3 04/24/2022   VLDL UNABLE TO CALCULATE IF TRIGLYCERIDE OVER 400 mg/dL 40/98/1191   LDLCALC UNABLE TO CALCULATE IF TRIGLYCERIDE OVER 400 mg/dL 47/82/9562    Physical Findings: AIMS: Facial and Oral Movements Muscles of Facial Expression: None, normal Lips and Perioral Area: None, normal Jaw: None, normal Tongue: None, normal,Extremity Movements Upper (arms, wrists, hands, fingers): None, normal Lower (legs, knees, ankles, toes): None, normal, Trunk Movements Neck, shoulders, hips: None, normal, Overall Severity Severity of abnormal movements (highest score from questions above): None, normal Incapacitation due to abnormal movements: None, normal Patient's awareness of abnormal movements (rate only patient's report): No Awareness, Dental Status Current problems with teeth and/or dentures?: No Does patient usually wear dentures?: No  CIWA:    COWS:     Musculoskeletal: Strength & Muscle Tone: within normal limits Gait & Station: normal Patient leans: N/A  Psychiatric Specialty Exam:  Presentation  General Appearance:  Disheveled  Eye Contact: Minimal  Speech: Pressured  Speech Volume: Normal  Handedness: Right   Mood and Affect  Mood: Euphoric;  Irritable  Affect: Inappropriate   Thought Process  Thought Processes: Disorganized  Descriptions of Associations:Loose  Orientation:Partial  Thought Content:Illogical; Scattered  History of Schizophrenia/Schizoaffective disorder:No data recorded Duration of Psychotic Symptoms:No data recorded Hallucinations:No data recorded Ideas of Reference:Delusions; Paranoia  Suicidal Thoughts:No data recorded Homicidal Thoughts:No data recorded  Sensorium  Memory: Immediate Poor; Recent Poor; Remote Poor  Judgment: Poor  Insight: Poor   Executive Functions  Concentration: Fair  Attention Span: Fair  Recall: Poor  Fund of Knowledge: Poor  Language: Poor   Psychomotor Activity  Psychomotor Activity:No data recorded  Assets  Assets: Desire for Improvement; Social Support   Sleep  Sleep:No data recorded   Physical Exam: Physical Exam Vitals and nursing note reviewed.  Constitutional:      Appearance: Normal appearance. He is normal weight.  Neurological:     General: No focal deficit present.     Mental Status: He is alert and oriented to person, place, and time.  Psychiatric:        Attention and Perception: Attention and perception normal.        Mood and Affect: Mood and affect normal.        Speech: Speech normal.        Behavior: Behavior normal. Behavior is cooperative.        Thought Content: Thought content normal.        Cognition and Memory: Cognition and memory normal.        Judgment: Judgment normal.    Review of Systems  Constitutional: Negative.   HENT: Negative.    Eyes: Negative.   Respiratory: Negative.    Cardiovascular: Negative.   Gastrointestinal: Negative.   Genitourinary: Negative.   Musculoskeletal: Negative.   Skin: Negative.   Neurological: Negative.   Endo/Heme/Allergies: Negative.   Psychiatric/Behavioral: Negative.     Blood pressure 108/77, pulse 99, temperature 97.9 F (36.6 C), temperature source Oral,  resp. rate 20, height 5\' 9"  (1.753 m), weight 96.4 kg, SpO2 96 %. Body mass index is 31.38 kg/m.   Treatment Plan Summary: Daily contact with patient to assess and evaluate symptoms and progress in treatment, Medication management, and Plan continue current medications.  Sarina Ill, DO 08/24/2022, 12:20 PM

## 2022-08-24 NOTE — Progress Notes (Signed)
Patient alert and oriented x 4, affect is flat but brightens upon approach. Patient's thoughts are organized and coherent, he denies SI/HI/AVH and his interacting appropriately with peers and staff. Patient was offered emotional support and encouraged to take care of his hygiene like bathing. Patient was not receptive and didn't take a bath. 15 minutes safety checks maintained will continue to monitor  closely.

## 2022-08-24 NOTE — Progress Notes (Signed)
D- Patient alert and oriented x 3-4. Affect flat/mood preoccupied. Denies SI/ HI/ AVH. Patient denies pain. Patient endorses depression and anxiety. Goal of the day. A- Scheduled medications administered to patient, per MD orders except for Colace and Protonix. Support and encouragement provided.  Routine safety checks conducted every 15 minutes without incident.  Patient informed to notify staff with problems or concerns and verbalizes understanding. R- No adverse drug reactions noted.  Patient compliant with treatment plan. Patient receptive, calm and cooperative. He isolates to his room except for meals. Patient contracts for safety and  remains safe on the unit at this time.

## 2022-08-25 NOTE — BH IP Treatment Plan (Signed)
Interdisciplinary Treatment and Diagnostic Plan Update  08/25/2022 Time of Session: 9:49 am Marc Miars MRN: [id number]  Principal Diagnosis: Undifferentiated schizophrenia [facility])  Secondary Diagnoses: Principal Problem:   Undifferentiated schizophrenia (HCC)   Current Medications:  Current Facility-Administered Medications  Medication Dose Route Frequency Provider Last Rate Last Admin   acetaminophen (TYLENOL) tablet 650 mg  650 mg Oral Q6H PRN Gabriel Cirri F, NP   650 mg at 08/22/22 2110   alum & mag hydroxide-simeth (MAALOX/MYLANTA) 200-200-20 MG/5ML suspension 30 mL  30 mL Oral Q4H PRN Gabriel Cirri F, NP   30 mL at 08/24/22 2125   ARIPiprazole ER (ABILIFY MAINTENA) injection 400 mg  400 mg Intramuscular Q28 days [name], [name], MD   400 mg at 08/22/22 1615   cloZAPine (CLOZARIL) tablet 300 mg  300 mg Oral QHS [name], [name], MD   300 mg at 08/24/22 2125   docusate sodium (COLACE) capsule 200 mg  200 mg Oral BID [name], [name], MD   200 mg at 07/27/22 0939   famotidine (PEPCID) tablet 20 mg  20 mg Oral BID [name], [name], MD   20 mg at 07/20/22 1740   guaiFENesin (ROBITUSSIN) 100 MG/5ML liquid 5 mL  5 mL Oral Q4H PRN [name], [name], MD   5 mL at 08/22/22 2110   hydrOXYzine (ATARAX) tablet 50 mg  50 mg Oral Q6H PRN [name], [name], MD   50 mg at 08/10/22 2143   magnesium citrate solution 1 Bottle  1 Bottle Oral Daily PRN Jaynie Bream, RPH   1 Bottle at 07/19/22 1643   magnesium hydroxide (MILK OF MAGNESIA) suspension 30 mL  30 mL Oral Daily PRN Jaynie Bream, RPH   30 mL at 07/18/22 1718   mineral oil liquid   Oral Daily PRN Sarina Ill, DO   Given at 07/17/22 1201   nicotine polacrilex (NICORETTE) gum 2 mg  2 mg Oral PRN [name], [name], MD   2 mg at 04/23/22 2200   polyethylene glycol (MIRALAX / GLYCOLAX) packet 17 g  17 g Oral Daily [name], [name], MD   17 g at 08/25/22 0805   senna-docusate (Senokot-S) tablet 2 tablet  2 tablet Oral Daily PRN  [name], [name], MD   2 tablet at 07/14/22 0809   ziprasidone (GEODON) injection 20 mg  20 mg Intramuscular Q12H PRN Reggie Pile, MD       PTA Medications: Medications Prior to Admission  Medication Sig Dispense Refill Last Dose   ABILIFY MAINTENA 400 MG SRER injection Inject 400 mg into the muscle every 28 (twenty-eight) days.      ARIPiprazole (ABILIFY) 10 MG tablet Take 10 mg by mouth daily. (Patient not taking: Reported on 04/16/2022)      atomoxetine (STRATTERA) 40 MG capsule Take 40 mg by mouth daily. (Patient not taking: Reported on 04/16/2022)      atorvastatin (LIPITOR) 40 MG tablet Take 40 mg by mouth daily.      budesonide-formoterol (SYMBICORT) 160-4.5 MCG/ACT inhaler Inhale 2 puffs into the lungs.      cetirizine (ZYRTEC) 10 MG tablet Take 10 mg by mouth daily.      clozapine (CLOZARIL) 200 MG tablet Take 200 mg by mouth 2 (two) times daily. Take along with one 25 mg tablet for total 225 mg twice daily      cloZAPine (CLOZARIL) 25 MG tablet Take 25 mg by mouth 2 (two) times daily. Take along with one 200 mg tablet for  total 225 mg twice daily      Dexlansoprazole (DEXILANT) 30 MG capsule Take 30 mg by mouth daily. (Patient not taking: Reported on 04/16/2022)      hydrOXYzine (VISTARIL) 50 MG capsule Take 50 mg by mouth 3 (three) times daily as needed for itching. (Patient not taking: Reported on 04/16/2022)      Melatonin 5 MG TABS Take 5 mg by mouth at bedtime as needed (sleep).      metoprolol succinate (TOPROL-XL) 25 MG 24 hr tablet Take 12.5 mg by mouth daily.      Phosphatidyl Choline 65 MG TABS Take 1 tablet by mouth daily.  (Patient not taking: Reported on 04/16/2022)      valproic acid (DEPAKENE) 250 MG/5ML solution Take 750-1,000 mLs by mouth 2 (two) times daily. 1000 mg (20 mL) every morning and 750 mg (15 mL) daily at bedtime       Patient Stressors: Other: Psychosis    Patient Strengths: Ability for insight  Active sense of humor  Average or above average  intelligence  Capable of independent living  Supportive family/friends   Treatment Modalities: Medication Management, Group therapy, Case management,  1 to 1 session with clinician, Psychoeducation, Recreational therapy.   Physician Treatment Plan for Primary Diagnosis: Undifferentiated schizophrenia (HCC) Long Term Goal(s):     Short Term Goals: None, pt is a poor hx.  Medication Management: Evaluate patient's response, side effects, and tolerance of medication regimen.  Therapeutic Interventions: 1 to 1 sessions, Unit Group sessions and Medication administration.  Evaluation of Outcomes: Progressing  Physician Treatment Plan for Secondary Diagnosis: Principal Problem:   Undifferentiated schizophrenia (HCC)  Long Term Goal(s):     Short Term Goals: None, pt is a poor hx.     Medication Management: Evaluate patient's response, side effects, and tolerance of medication regimen.  Therapeutic Interventions: 1 to 1 sessions, Unit Group sessions and Medication administration.  Evaluation of Outcomes: Progressing   RN Treatment Plan for Primary Diagnosis: Undifferentiated schizophrenia (HCC) Long Term Goal(s): Knowledge of disease and therapeutic regimen to maintain health will improve  Short Term Goals: Ability to remain free from injury will improve, Ability to verbalize frustration and anger appropriately will improve, Ability to demonstrate self-control, Ability to participate in decision making will improve, Ability to verbalize feelings will improve, Ability to disclose and discuss suicidal ideas, Ability to identify and develop effective coping behaviors will improve, and Compliance with prescribed medications will improve  Medication Management: RN will administer medications as ordered by provider, will assess and evaluate patient's response and provide education to patient for prescribed medication. RN will report any adverse and/or side effects to prescribing  provider.  Therapeutic Interventions: 1 on 1 counseling sessions, Psychoeducation, Medication administration, Evaluate responses to treatment, Monitor vital signs and CBGs as ordered, Perform/monitor CIWA, COWS, AIMS and Fall Risk screenings as ordered, Perform wound care treatments as ordered.  Evaluation of Outcomes: Progressing   LCSW Treatment Plan for Primary Diagnosis: Undifferentiated schizophrenia (HCC) Long Term Goal(s): Safe transition to appropriate next level of care at discharge, Engage patient in therapeutic group addressing interpersonal concerns.  Short Term Goals: Engage patient in aftercare planning with referrals and resources, Increase social support, Increase ability to appropriately verbalize feelings, Increase emotional regulation, Facilitate acceptance of mental health diagnosis and concerns, Facilitate patient progression through stages of change regarding substance use diagnoses and concerns, Identify triggers associated with mental health/substance abuse issues, and Increase skills for wellness and recovery  Therapeutic Interventions: Assess for all discharge  needs, 1 to 1 time with Child psychotherapist, Explore available resources and support systems, Assess for adequacy in community support network, Educate family and significant other(s) on suicide prevention, Complete Psychosocial Assessment, Interpersonal group therapy.  Evaluation of Outcomes: Progressing   Progress in Treatment: Attending groups: No. Participating in groups: No. Taking medication as prescribed: Yes. Toleration medication: Yes. Family/Significant other contact made: Yes, individual(s) contacted:  Wayne Hack/Father [phone] Patient understands diagnosis: Yes. Discussing patient identified problems/goals with staff: Yes. Medical problems stabilized or resolved: Yes. Denies suicidal/homicidal ideation: Yes. Issues/concerns per patient self-inventory: No. Other: none  New problem(s)  identified: No, Describe:  none  New Short Term/Long Term Goal(s): Patient to work towards elimination of symptoms of psychosis, medication management for mood stabilization; development of comprehensive mental wellness plan. Update 08/25/22: no additional goals identified at this time.   Patient Goals:   No additional goals identified at this time. Patient to continue to work towards original goals identified in initial treatment team meeting. CSW will remain available to patient should they voice additional treatment goals. Update 08/25/22: no additional goals identified at this time.  Discharge Plan or Barriers: Patient lack funding to procure group home placement. Per note on 4/29, "CSW reached [facility] DSS regarding update on payee application. [name], DSS Guardian Supervisor, at [phone] ext. [phone] reports the patient has been awarded [employer], although the social security office has not yet awarded an amount. CSW clarified if DSS has been appointed as the patient's payee at this time. [name] reports they have not been appointed as the payee yet. CSW encouraged DSS to apply or have someone appointed payee as soon as possible in order to avoid any delays once social security has determined an amount."  Update 08/25/22: Patient is waiting on insurance.   Reason for Continuation of Hospitalization: Delusions  Medication stabilization  Estimated Length of Stay: TBD  Last 3 [place] Suicide Severity Risk Score: Flowsheet Row Admission (Current) from 04/16/2022 in [facility]IENT BE[facility]AL MEDICINE ED from 04/15/2022 in [facility] Emergency Department at [facility]  C-SSRS RISK CATEGORY No Risk No Risk       Last PHQ 2/9 Scores:     No data to display          Scribe for Treatment Team: Marshell Levan, LCSW 08/25/2022 9:49 AM

## 2022-08-25 NOTE — Progress Notes (Signed)
[facility] MD Progress Note  08/25/2022 12:04 PM Travis Sandoval  MRN:  [id number] Subjective: Travis Sandoval is seen on rounds.  As usual he has no complaints.  No side effects from his medications.  He states that he is doing fine.  Nurses report no issues. Principal Problem: Undifferentiated schizophrenia (HCC) Diagnosis: Principal Problem:   Undifferentiated schizophrenia (HCC)  Total Time spent with patient: 15 minutes  Past Psychiatric History: Schizophrenia  Past Medical History:  Past Medical History:  Diagnosis Date   ADHD    Bipolar 1 disorder (HCC)    Hepatitis C    Schizoaffective disorder (HCC)    History reviewed. No pertinent surgical history. Family History:  Family History  Family history unknown: Yes   Family Psychiatric  History: Unremarkable Social History:  Social History   Substance and Sexual Activity  Alcohol Use Not Currently     Social History   Substance and Sexual Activity  Drug Use Not Currently    Social History   Socioeconomic History   Marital status: Unknown    Spouse name: Not on file   Number of children: Not on file   Years of education: Not on file   Highest education level: Not on file  Occupational History   Not on file  Tobacco Use   Smoking status: Every Day    Packs/day: 0.25    Years: 15.00    Additional pack years: 0.00    Total pack years: 3.75    Types: Cigarettes   Smokeless tobacco: Not on file  Vaping Use   Vaping Use: Not on file  Substance and Sexual Activity   Alcohol use: Not Currently   Drug use: Not Currently   Sexual activity: Not Currently  Other Topics Concern   Not on file  Social History Narrative   Not on file   Social Determinants of Health   Financial Resource Strain: Not on file  Food Insecurity: Patient Declined (05/18/2022)   Hunger Vital Sign    Worried About Running Out of Food in the Last Year: Patient declined    Ran Out of Food in the Last Year: Patient declined  Transportation Needs: Patient  Declined (05/18/2022)   PRAPARE - Administrator, Civil Service (Medical): Patient declined    Lack of Transportation (Non-Medical): Patient declined  Physical Activity: Not on file  Stress: Not on file  Social Connections: Not on file   Additional Social History:                         Sleep: Good  Appetite:  Good  Current Medications: Current Facility-Administered Medications  Medication Dose Route Frequency Provider Last Rate Last Admin   acetaminophen (TYLENOL) tablet 650 mg  650 mg Oral Q6H PRN Gabriel Cirri F, NP   650 mg at 08/22/22 2110   alum & mag hydroxide-simeth (MAALOX/MYLANTA) 200-200-20 MG/5ML suspension 30 mL  30 mL Oral Q4H PRN Gabriel Cirri F, NP   30 mL at 08/24/22 2125   ARIPiprazole ER (ABILIFY MAINTENA) injection 400 mg  400 mg Intramuscular Q28 days [name], [name], MD   400 mg at 08/22/22 1615   cloZAPine (CLOZARIL) tablet 300 mg  300 mg Oral QHS [name], [name], MD   300 mg at 08/24/22 2125   docusate sodium (COLACE) capsule 200 mg  200 mg Oral BID [name], [name], MD   200 mg at 07/27/22 0939   famotidine (PEPCID) tablet 20 mg  20  mg Oral BID [name], [name], MD   20 mg at 07/20/22 1740   guaiFENesin (ROBITUSSIN) 100 MG/5ML liquid 5 mL  5 mL Oral Q4H PRN [name], [name], MD   5 mL at 08/22/22 2110   hydrOXYzine (ATARAX) tablet 50 mg  50 mg Oral Q6H PRN [name], [name], MD   50 mg at 08/10/22 2143   magnesium citrate solution 1 Bottle  1 Bottle Oral Daily PRN Jaynie Bream, RPH   1 Bottle at 07/19/22 1643   magnesium hydroxide (MILK OF MAGNESIA) suspension 30 mL  30 mL Oral Daily PRN Jaynie Bream, RPH   30 mL at 07/18/22 1718   mineral oil liquid   Oral Daily PRN Sarina Ill, DO   Given at 07/17/22 1201   nicotine polacrilex (NICORETTE) gum 2 mg  2 mg Oral PRN [name], [name], MD   2 mg at 04/23/22 2200   polyethylene glycol (MIRALAX / GLYCOLAX) packet 17 g  17 g Oral Daily [name], [name], MD   17 g at 08/25/22  0805   senna-docusate (Senokot-S) tablet 2 tablet  2 tablet Oral Daily PRN [name], [name], MD   2 tablet at 07/14/22 0809   ziprasidone (GEODON) injection 20 mg  20 mg Intramuscular Q12H PRN Reggie Pile, MD        Lab Results: No results found for this or any previous visit (from the past 48 hour(s)).  Blood Alcohol level:  Lab Results  Component Value Date   ETH <10 04/15/2022    Metabolic Disorder Labs: Lab Results  Component Value Date   HGBA1C 5.5 04/24/2022   MPG 111 04/24/2022   No results found for: "PROLACTIN" Lab Results  Component Value Date   CHOL 220 (H) 04/24/2022   TRIG 700 (H) 04/24/2022   HDL 35 (L) 04/24/2022   CHOLHDL 6.3 04/24/2022   VLDL UNABLE TO CALCULATE IF TRIGLYCERIDE OVER 400 mg/dL 16/01/9603   LDLCALC UNABLE TO CALCULATE IF TRIGLYCERIDE OVER 400 mg/dL 54/12/8117    Physical Findings: AIMS: Facial and Oral Movements Muscles of Facial Expression: None, normal Lips and Perioral Area: None, normal Jaw: None, normal Tongue: None, normal,Extremity Movements Upper (arms, wrists, hands, fingers): None, normal Lower (legs, knees, ankles, toes): None, normal, Trunk Movements Neck, shoulders, hips: None, normal, Overall Severity Severity of abnormal movements (highest score from questions above): None, normal Incapacitation due to abnormal movements: None, normal Patient's awareness of abnormal movements (rate only patient's report): No Awareness, Dental Status Current problems with teeth and/or dentures?: No Does patient usually wear dentures?: No  CIWA:    COWS:     Musculoskeletal: Strength & Muscle Tone: within normal limits Gait & Station: normal Patient leans: N/A  Psychiatric Specialty Exam:  Presentation  General Appearance:  Disheveled  Eye Contact: Minimal  Speech: Pressured  Speech Volume: Normal  Handedness: Right   Mood and Affect  Mood: Euphoric; Irritable  Affect: Inappropriate   Thought Process  Thought  Processes: Disorganized  Descriptions of Associations:Loose  Orientation:Partial  Thought Content:Illogical; Scattered  History of Schizophrenia/Schizoaffective disorder:No data recorded Duration of Psychotic Symptoms:No data recorded Hallucinations:No data recorded Ideas of Reference:Delusions; Paranoia  Suicidal Thoughts:No data recorded Homicidal Thoughts:No data recorded  Sensorium  Memory: Immediate Poor; Recent Poor; Remote Poor  Judgment: Poor  Insight: Poor   Executive Functions  Concentration: Fair  Attention Span: Fair  Recall: Poor  Fund of Knowledge: Poor  Language: Poor   Psychomotor Activity  Psychomotor Activity:No data recorded  Assets  Assets: Desire for Improvement; Social Support   Sleep  Sleep:No data recorded   Physical Exam: Physical Exam Vitals and nursing note reviewed.  Constitutional:      Appearance: Normal appearance. He is normal weight.  Neurological:     General: No focal deficit present.     Mental Status: He is alert and oriented to person, place, and time.  Psychiatric:        Attention and Perception: Attention and perception normal.        Mood and Affect: Mood and affect normal.        Speech: Speech normal.        Behavior: Behavior normal. Behavior is cooperative.        Thought Content: Thought content normal.        Cognition and Memory: Cognition and memory normal.        Judgment: Judgment normal.    Review of Systems  Constitutional: Negative.   HENT: Negative.    Eyes: Negative.   Respiratory: Negative.    Cardiovascular: Negative.   Gastrointestinal: Negative.   Genitourinary: Negative.   Musculoskeletal: Negative.   Skin: Negative.   Neurological: Negative.   Endo/Heme/Allergies: Negative.   Psychiatric/Behavioral: Negative.     Blood pressure 135/83, pulse 92, temperature 97.8 F (36.6 C), temperature source Oral, resp. rate 20, height 5\' 9"  (1.753 m), weight 96.4 kg, SpO2 96 %.  Body mass index is 31.38 kg/m.   Treatment Plan Summary: Daily contact with patient to assess and evaluate symptoms and progress in treatment, Medication management, and Plan continue current medications.  Sarina Ill, DO 08/25/2022, 12:04 PM

## 2022-08-25 NOTE — [facility] Group Notes (Signed)
[facility] Group Notes:  (Nursing/MHT/Case Management/Adjunct)  Date:  08/25/2022  Time:  10:06 AM  Type of Therapy:  Psychoeducational Skills  Participation Level:  Did Not Attend  Participation Quality:   na  Affect:   na  Cognitive:   na  Insight:  None  Engagement in Group:   na  Modes of Intervention:   na  Summary of Progress/Problems:  Travis Sandoval 08/25/2022, 10:06 AM

## 2022-08-25 NOTE — Plan of Care (Signed)
  Problem: Education: Goal: Knowledge of General Education information will improve Description: Including pain rating scale, medication(s)/side effects and non-pharmacologic comfort measures Outcome: Progressing   Problem: Health Behavior/Discharge Planning: Goal: Ability to manage health-related needs will improve Outcome: Progressing   Problem: Clinical Measurements: Goal: Ability to maintain clinical measurements within normal limits will improve Outcome: Progressing Goal: Will remain free from infection Outcome: Progressing Goal: Diagnostic test results will improve Outcome: Progressing Goal: Respiratory complications will improve Outcome: Progressing Goal: Cardiovascular complication will be avoided Outcome: Progressing   Problem: Activity: Goal: Risk for activity intolerance will decrease Outcome: Progressing   Problem: Nutrition: Goal: Adequate nutrition will be maintained Outcome: Progressing   Problem: Coping: Goal: Level of anxiety will decrease Outcome: Progressing   Problem: Elimination: Goal: Will not experience complications related to urinary retention Outcome: Progressing   Problem: Pain Managment: Goal: General experience of comfort will improve Outcome: Progressing   Problem: Safety: Goal: Ability to remain free from injury will improve Outcome: Progressing   Problem: Skin Integrity: Goal: Risk for impaired skin integrity will decrease Outcome: Progressing   Problem: Education: Goal: Knowledge of [place] [facility] Education information/materials will improve Outcome: Progressing Goal: Emotional status will improve Outcome: Progressing Goal: Mental status will improve Outcome: Progressing Goal: Verbalization of understanding the information provided will improve Outcome: Progressing   Problem: Activity: Goal: Interest or engagement in activities will improve Outcome: Progressing Goal: Sleeping patterns will improve Outcome:  Progressing   Problem: Coping: Goal: Ability to verbalize frustrations and anger appropriately will improve Outcome: Progressing Goal: Ability to demonstrate self-control will improve Outcome: Progressing   Problem: Health Behavior/Discharge Planning: Goal: Identification of resources available to assist in meeting health care needs will improve Outcome: Progressing Goal: Compliance with treatment plan for underlying cause of condition will improve Outcome: Progressing   Problem: Physical Regulation: Goal: Ability to maintain clinical measurements within normal limits will improve Outcome: Progressing   Problem: Safety: Goal: Periods of time without injury will increase Outcome: Progressing   Problem: Activity: Goal: Will verbalize the importance of balancing activity with adequate rest periods Outcome: Progressing   Problem: Education: Goal: Will be free of psychotic symptoms Outcome: Progressing Goal: Knowledge of the prescribed therapeutic regimen will improve Outcome: Progressing   Problem: Coping: Goal: Coping ability will improve Outcome: Progressing Goal: Will verbalize feelings Outcome: Progressing   Problem: Health Behavior/Discharge Planning: Goal: Compliance with prescribed medication regimen will improve Outcome: Progressing   Problem: Nutritional: Goal: Ability to achieve adequate nutritional intake will improve Outcome: Progressing   Problem: Role Relationship: Goal: Ability to communicate needs accurately will improve Outcome: Progressing Goal: Ability to interact with others will improve Outcome: Progressing   Problem: Safety: Goal: Ability to redirect hostility and anger into socially appropriate behaviors will improve Outcome: Progressing Goal: Ability to remain free from injury will improve Outcome: Progressing   Problem: Self-Care: Goal: Ability to participate in self-care as condition permits will improve Outcome: Progressing    Problem: Self-Concept: Goal: Will verbalize positive feelings about self Outcome: Progressing   

## 2022-08-25 NOTE — Progress Notes (Signed)
Patient alert and oriented x 4, affect is flat thoughts are organized, no distress noted, he is interacting appropriately with peers and staff. Patient denies SI/HI/AVH, 15 minutes safety checks maintained will continue to monitor closely.

## 2022-08-25 NOTE — [facility] Group Notes (Signed)
[facility] Group Notes:  (Nursing/MHT/Case Management/Adjunct)  Date:  08/25/2022  Time:  2:33 PM  Type of Therapy:  Group Therapy  Participation Level:  Did Not Attend  Participation Quality:   na  Affect:   na  Cognitive:   na  Insight:  None  Engagement in Group:   na  Modes of Intervention:   na  Summary of Progress/Problems: Did not attend goals group. Malva Limes 08/25/2022, 2:33 PM

## 2022-08-25 NOTE — Progress Notes (Signed)
D- Patient alert and oriented x 3-4. Affect blunted/mood preoccupied. Denies SI/ HI/ AVH. Patient denies pain. Patient denies depression and anxiety.  A- Scheduled medications administered to patient, per MD orders except Colace and Pepcid. Support and encouragement provided.  Routine safety checks conducted every 15 minutes without incident.  Patient informed to notify staff with problems or concerns and verbalizes understanding. R- No adverse drug reactions noted.  Patient compliant with treatment plan. Patient receptive, calm and cooperative. He is isolate to room except for meals. Patient contracts for safety and  remains safe on the unit at this time.

## 2022-08-26 NOTE — Group Note (Signed)
[facility] LCSW Group Therapy Note    Group Date: 08/26/2022 Start Time: 1300 End Time: 1340  Type of Therapy and Topic:  Group Therapy:  Overcoming Obstacles  Participation Level:  [facility] PARTICIPATION LEVEL: Did Not Attend   Description of Group:   In this group patients will be encouraged to explore what they see as obstacles to their own wellness and recovery. They will be guided to discuss their thoughts, feelings, and behaviors related to these obstacles. The group will process together ways to cope with barriers, with attention given to specific choices patients can make. Each patient will be challenged to identify changes they are motivated to make in order to overcome their obstacles. This group will be process-oriented, with patients participating in exploration of their own experiences as well as giving and receiving support and challenge from other group members.  Therapeutic Goals: 1. Patient will identify personal and current obstacles as they relate to admission. 2. Patient will identify barriers that currently interfere with their wellness or overcoming obstacles.  3. Patient will identify feelings, thought process and behaviors related to these barriers. 4. Patient will identify two changes they are willing to make to overcome these obstacles:    Summary of Patient Progress X   Therapeutic Modalities:   Cognitive Behavioral Therapy Solution Focused Therapy Motivational Interviewing Relapse Prevention Therapy   Glenis Smoker, LCSW

## 2022-08-26 NOTE — Progress Notes (Signed)
Patient alert and oriented x 4, affect is flat but brightens upon approach. Patient's thoughts are organized and coherent, he denies SI/HI/AVH and his interacting appropriately with peers and staff. Patient was offered emotional support and encouraged to take care of his hygiene most especially taking a bath. Patient was not receptive and didn't take a bath. 15 minutes safety checks maintained will continue to monitor closely.

## 2022-08-26 NOTE — Progress Notes (Signed)
[facility] MD Progress Note  08/26/2022 11:37 AM Travis Sandoval  MRN:  [id number] Subjective: No change to presentation.  Stays isolated most of the time.  Denies acute symptoms. Principal Problem: Undifferentiated schizophrenia (HCC) Diagnosis: Principal Problem:   Undifferentiated schizophrenia (HCC)  Total Time spent with patient: 30 minutes  Past Psychiatric History: Past history of schizophrenia  Past Medical History:  Past Medical History:  Diagnosis Date   ADHD    Bipolar 1 disorder (HCC)    Hepatitis C    Schizoaffective disorder (HCC)    History reviewed. No pertinent surgical history. Family History:  Family History  Family history unknown: Yes   Family Psychiatric  History: See previous Social History:  Social History   Substance and Sexual Activity  Alcohol Use Not Currently     Social History   Substance and Sexual Activity  Drug Use Not Currently    Social History   Socioeconomic History   Marital status: Unknown    Spouse name: Not on file   Number of children: Not on file   Years of education: Not on file   Highest education level: Not on file  Occupational History   Not on file  Tobacco Use   Smoking status: Every Day    Packs/day: 0.25    Years: 15.00    Additional pack years: 0.00    Total pack years: 3.75    Types: Cigarettes   Smokeless tobacco: Not on file  Vaping Use   Vaping Use: Not on file  Substance and Sexual Activity   Alcohol use: Not Currently   Drug use: Not Currently   Sexual activity: Not Currently  Other Topics Concern   Not on file  Social History Narrative   Not on file   Social Determinants of Health   Financial Resource Strain: Not on file  Food Insecurity: Patient Declined (05/18/2022)   Hunger Vital Sign    Worried About Running Out of Food in the Last Year: Patient declined    Ran Out of Food in the Last Year: Patient declined  Transportation Needs: Patient Declined (05/18/2022)   PRAPARE - Therapist, art (Medical): Patient declined    Lack of Transportation (Non-Medical): Patient declined  Physical Activity: Not on file  Stress: Not on file  Social Connections: Not on file   Additional Social History:                         Sleep: Fair  Appetite:  Fair  Current Medications: Current Facility-Administered Medications  Medication Dose Route Frequency Provider Last Rate Last Admin   acetaminophen (TYLENOL) tablet 650 mg  650 mg Oral Q6H PRN Gabriel Cirri F, NP   650 mg at 08/22/22 2110   alum & mag hydroxide-simeth (MAALOX/MYLANTA) 200-200-20 MG/5ML suspension 30 mL  30 mL Oral Q4H PRN Gabriel Cirri F, NP   30 mL at 08/24/22 2125   ARIPiprazole ER (ABILIFY MAINTENA) injection 400 mg  400 mg Intramuscular Q28 days Breland Elders T, MD   400 mg at 08/22/22 1615   cloZAPine (CLOZARIL) tablet 300 mg  300 mg Oral QHS Adiel Mcnamara T, MD   300 mg at 08/25/22 2118   docusate sodium (COLACE) capsule 200 mg  200 mg Oral BID Kyrstyn Greear T, MD   200 mg at 07/27/22 0939   famotidine (PEPCID) tablet 20 mg  20 mg Oral BID Terrina Docter, [name], MD   20 mg at 07/20/22  1740   guaiFENesin (ROBITUSSIN) 100 MG/5ML liquid 5 mL  5 mL Oral Q4H PRN Shantele Reller T, MD   5 mL at 08/22/22 2110   hydrOXYzine (ATARAX) tablet 50 mg  50 mg Oral Q6H PRN Haskel Dewalt T, MD   50 mg at 08/10/22 2143   magnesium citrate solution 1 Bottle  1 Bottle Oral Daily PRN Jaynie Bream, RPH   1 Bottle at 07/19/22 1643   magnesium hydroxide (MILK OF MAGNESIA) suspension 30 mL  30 mL Oral Daily PRN Jaynie Bream, RPH   30 mL at 07/18/22 1718   mineral oil liquid   Oral Daily PRN Sarina Ill, DO   Given at 07/17/22 1201   nicotine polacrilex (NICORETTE) gum 2 mg  2 mg Oral PRN Nishat Livingston, [name], MD   2 mg at 04/23/22 2200   polyethylene glycol (MIRALAX / GLYCOLAX) packet 17 g  17 g Oral Daily Isaac Lacson, [name], MD   17 g at 08/26/22 0802   senna-docusate (Senokot-S) tablet 2 tablet  2  tablet Oral Daily PRN Derico Mitton, [name], MD   2 tablet at 07/14/22 0809   ziprasidone (GEODON) injection 20 mg  20 mg Intramuscular Q12H PRN Reggie Pile, MD        Lab Results: No results found for this or any previous visit (from the past 48 hour(s)).  Blood Alcohol level:  Lab Results  Component Value Date   ETH <10 04/15/2022    Metabolic Disorder Labs: Lab Results  Component Value Date   HGBA1C 5.5 04/24/2022   MPG 111 04/24/2022   No results found for: "PROLACTIN" Lab Results  Component Value Date   CHOL 220 (H) 04/24/2022   TRIG 700 (H) 04/24/2022   HDL 35 (L) 04/24/2022   CHOLHDL 6.3 04/24/2022   VLDL UNABLE TO CALCULATE IF TRIGLYCERIDE OVER 400 mg/dL 16/01/9603   LDLCALC UNABLE TO CALCULATE IF TRIGLYCERIDE OVER 400 mg/dL 54/12/8117    Physical Findings: AIMS: Facial and Oral Movements Muscles of Facial Expression: None, normal Lips and Perioral Area: None, normal Jaw: None, normal Tongue: None, normal,Extremity Movements Upper (arms, wrists, hands, fingers): None, normal Lower (legs, knees, ankles, toes): None, normal, Trunk Movements Neck, shoulders, hips: None, normal, Overall Severity Severity of abnormal movements (highest score from questions above): None, normal Incapacitation due to abnormal movements: None, normal Patient's awareness of abnormal movements (rate only patient's report): No Awareness, Dental Status Current problems with teeth and/or dentures?: No Does patient usually wear dentures?: No  CIWA:    COWS:     Musculoskeletal: Strength & Muscle Tone: within normal limits Gait & Station: normal Patient leans: N/A  Psychiatric Specialty Exam:  Presentation  General Appearance:  Disheveled  Eye Contact: Minimal  Speech: Pressured  Speech Volume: Normal  Handedness: Right   Mood and Affect  Mood: Euphoric; Irritable  Affect: Inappropriate   Thought Process  Thought Processes: Disorganized  Descriptions of  Associations:Loose  Orientation:Partial  Thought Content:Illogical; Scattered  History of Schizophrenia/Schizoaffective disorder:No data recorded Duration of Psychotic Symptoms:No data recorded Hallucinations:No data recorded Ideas of Reference:Delusions; Paranoia  Suicidal Thoughts:No data recorded Homicidal Thoughts:No data recorded  Sensorium  Memory: Immediate Poor; Recent Poor; Remote Poor  Judgment: Poor  Insight: Poor   Executive Functions  Concentration: Fair  Attention Span: Fair  Recall: Poor  Fund of Knowledge: Poor  Language: Poor   Psychomotor Activity  Psychomotor Activity:No data recorded  Assets  Assets: Desire for Improvement; Social Support   Sleep  Sleep:No data recorded   Physical Exam: Physical Exam Vitals and nursing note reviewed.  Constitutional:      Appearance: Normal appearance.  HENT:     Head: Normocephalic and atraumatic.     Mouth/Throat:     Pharynx: Oropharynx is clear.  Eyes:     Pupils: Pupils are equal, round, and reactive to light.  Cardiovascular:     Rate and Rhythm: Normal rate and regular rhythm.  Pulmonary:     Effort: Pulmonary effort is normal.     Breath sounds: Normal breath sounds.  Abdominal:     General: Abdomen is flat.     Palpations: Abdomen is soft.  Musculoskeletal:        General: Normal range of motion.  Skin:    General: Skin is warm and dry.  Neurological:     General: No focal deficit present.     Mental Status: He is alert. Mental status is at baseline.  Psychiatric:        Attention and Perception: He is inattentive.        Mood and Affect: Mood normal. Affect is blunt.        Speech: Speech is delayed.        Behavior: Behavior is slowed.        Thought Content: Thought content normal.        Cognition and Memory: Cognition is impaired.    Review of Systems  Constitutional: Negative.   HENT: Negative.    Eyes: Negative.   Respiratory: Negative.    Cardiovascular:  Negative.   Gastrointestinal: Negative.   Musculoskeletal: Negative.   Skin: Negative.   Neurological: Negative.   Psychiatric/Behavioral: Negative.     Blood pressure 114/87, pulse 98, temperature 97.7 F (36.5 C), temperature source Oral, resp. rate 18, height 5\' 9"  (1.753 m), weight 96.4 kg, SpO2 97 %. Body mass index is 31.38 kg/m.   Treatment Plan Summary: Plan stable primarily waiting for discharge planning.  Mordecai Rasmussen, MD 08/26/2022, 11:37 AM

## 2022-08-26 NOTE — Group Note (Signed)
Recreation Therapy Group Note   Group Topic:Goal Setting  Group Date: 08/26/2022 Start Time: 1000 End Time: 1100 Facilitators: Rosina Lowenstein, LRT, CTRS Location:  Craft Room  Group Description: Scientist, research (physical sciences). Patients were given many different magazines, a glue stick, markers, and a piece of cardstock paper. LRT and pts discussed the importance of having goals in life. LRT and pts discussed the difference between short-term and long-term goals, as well as what a SMART goal is. LRT encouraged pts to create a vision board, with images they picked and then cut out by LRT from the magazine, for themselves, that capture their short and long-term goals. On the back of the paper, pt encouraged to write 3 different coping skills that can help them reach those goals. LRT encouraged pts to show and explain their vision board to the group. LRT offered to laminate vision board once dry and complete.   Goal Area(s) Addressed:  Patient will gain knowledge of short vs. long term goals.  Patient will identify goals for themselves. Patient will practice setting SMART goals.  Affect/Mood: N/A   Participation Level: Did not attend    Clinical Observations/Individualized Feedback: Mekel did not attend group due to resting in his room.  Plan: Continue to engage patient in RT group sessions 2-3x/week.   Rosina Lowenstein, LRT, CTRS 08/26/2022 11:35 AM

## 2022-08-26 NOTE — Plan of Care (Signed)

## 2022-08-26 NOTE — Progress Notes (Signed)
Pt denies SI/HI/AVH and verbally agrees to approach staff if these become apparent or before harming themselves/others. Rates depression 0/10. Rates anxiety 0/10. Rates pain 0/10.  Scheduled medications administered to pt, per MD orders. RN provided support and encouragement to pt. Q15 min safety checks implemented and continued. Pt is safe on the unit. Plan of care on going and no other concerns expressed at this time.  08/26/22 0802  Psych Admission Type (Psych Patients Only)  Admission Status Voluntary  Psychosocial Assessment  Patient Complaints None  Eye Contact Fair  Facial Expression Flat  Affect Blunted  Speech Logical/coherent  Interaction Assertive  Motor Activity Slow  Appearance/Hygiene Improved  Behavior Characteristics Cooperative;Appropriate to situation;Calm  Mood Pleasant  Aggressive Behavior  Effect No apparent injury  Thought Process  Coherency Circumstantial  Content WDL  Delusions None reported or observed  Perception Hallucinations  Hallucination Auditory  Judgment Impaired  Confusion None  Danger to Self  Current suicidal ideation? Denies  Danger to Others  Danger to Others None reported or observed

## 2022-08-27 NOTE — Plan of Care (Signed)
D- Patient alert and oriented. Patient presented in a pleasant mood on assessment reporting that he slept "good" last night and had no complaints to voice to this Clinical research associate. Patient denied SI, HI, AVH, and pain at this time. Patient also denied any signs/symptoms of depression and anxiety, stating that he is doing ok today. Patient had no stated goals for today.  A- Some scheduled medications administered to patient, per MD orders. Support and encouragement provided.  Routine safety checks conducted every 15 minutes. Patient informed to notify staff with problems or concerns.  R- No adverse drug reactions noted. Patient contracts for safety at this time. Patient compliant with some medications. Patient receptive, calm, and cooperative. Patient isolates to room, except for meals and medication. Patient remains safe at this time.  Problem: Education: Goal: Knowledge of General Education information will improve Description: Including pain rating scale, medication(s)/side effects and non-pharmacologic comfort measures Outcome: Progressing   Problem: Health Behavior/Discharge Planning: Goal: Ability to manage health-related needs will improve Outcome: Progressing   Problem: Clinical Measurements: Goal: Ability to maintain clinical measurements within normal limits will improve Outcome: Progressing Goal: Will remain free from infection Outcome: Progressing Goal: Diagnostic test results will improve Outcome: Progressing Goal: Respiratory complications will improve Outcome: Progressing Goal: Cardiovascular complication will be avoided Outcome: Progressing   Problem: Activity: Goal: Risk for activity intolerance will decrease Outcome: Progressing   Problem: Nutrition: Goal: Adequate nutrition will be maintained Outcome: Progressing   Problem: Coping: Goal: Level of anxiety will decrease Outcome: Progressing   Problem: Elimination: Goal: Will not experience complications related to  urinary retention Outcome: Progressing   Problem: Pain Managment: Goal: General experience of comfort will improve Outcome: Progressing   Problem: Safety: Goal: Ability to remain free from injury will improve Outcome: Progressing   Problem: Skin Integrity: Goal: Risk for impaired skin integrity will decrease Outcome: Progressing   Problem: Education: Goal: Knowledge of [place] [facility] Education information/materials will improve Outcome: Progressing Goal: Emotional status will improve Outcome: Progressing Goal: Mental status will improve Outcome: Progressing Goal: Verbalization of understanding the information provided will improve Outcome: Progressing   Problem: Activity: Goal: Interest or engagement in activities will improve Outcome: Progressing Goal: Sleeping patterns will improve Outcome: Progressing   Problem: Coping: Goal: Ability to verbalize frustrations and anger appropriately will improve Outcome: Progressing Goal: Ability to demonstrate self-control will improve Outcome: Progressing   Problem: Health Behavior/Discharge Planning: Goal: Identification of resources available to assist in meeting health care needs will improve Outcome: Progressing Goal: Compliance with treatment plan for underlying cause of condition will improve Outcome: Progressing   Problem: Physical Regulation: Goal: Ability to maintain clinical measurements within normal limits will improve Outcome: Progressing   Problem: Safety: Goal: Periods of time without injury will increase Outcome: Progressing   Problem: Activity: Goal: Will verbalize the importance of balancing activity with adequate rest periods Outcome: Progressing   Problem: Education: Goal: Will be free of psychotic symptoms Outcome: Progressing Goal: Knowledge of the prescribed therapeutic regimen will improve Outcome: Progressing   Problem: Coping: Goal: Coping ability will improve Outcome:  Progressing Goal: Will verbalize feelings Outcome: Progressing   Problem: Health Behavior/Discharge Planning: Goal: Compliance with prescribed medication regimen will improve Outcome: Progressing   Problem: Nutritional: Goal: Ability to achieve adequate nutritional intake will improve Outcome: Progressing   Problem: Role Relationship: Goal: Ability to communicate needs accurately will improve Outcome: Progressing Goal: Ability to interact with others will improve Outcome: Progressing   Problem: Safety: Goal: Ability to redirect hostility and  anger into socially appropriate behaviors will improve Outcome: Progressing Goal: Ability to remain free from injury will improve Outcome: Progressing   Problem: Self-Care: Goal: Ability to participate in self-care as condition permits will improve Outcome: Progressing   Problem: Self-Concept: Goal: Will verbalize positive feelings about self Outcome: Progressing

## 2022-08-27 NOTE — [place] FL2 (Signed)
[place] MEDICAID FL2 LEVEL OF CARE FORM     IDENTIFICATION  Patient Name: Travis Sandoval Birthdate: [birth year] Sex: male Admission Date (Current Location): 04/16/2022  [place] and [place] Number:  [address] [id number] [facility] Facility and Address:  [facility], [address], [place], [place] [address]      Provider Number: [id number]  Attending Physician Name and Address:  Audery Amel, MD  Relative Name and Phone Number:  Gwyndolyn Saxon DSS Guardian, [phone]    Current Level of Care: Hospital Recommended Level of Care: Other (Comment) (AFL, Group Home, Independent Living Home) Prior Approval Number:    Date Approved/Denied:   PASRR Number:    Discharge Plan: Other (Comment) (AFL, Group Home, Independent Living Home)    Current Diagnoses: Patient Active Problem List   Diagnosis Date Noted   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   Attention deficit hyperactivity disorder (ADHD) 04/15/2022    Orientation RESPIRATION BLADDER Height & Weight     Self, Time, Place  Normal Continent Weight: 96.4 kg Height:  5\' 9"  (175.3 cm)  BEHAVIORAL SYMPTOMS/MOOD NEUROLOGICAL BOWEL NUTRITION STATUS   (None)  (none) Continent  (Normal Diet)  AMBULATORY STATUS COMMUNICATION OF NEEDS Skin   Independent Verbally Normal                       Personal Care Assistance Level of Assistance   (No assistance w/ ADLs)           Functional Limitations Info   (none)          SPECIAL CARE FACTORS FREQUENCY   (None)                    Contractures Contractures Info: Not present    Additional Factors Info   (None) Allergies  Allergen Reactions   Codeine Other (See Comments)    Causes fever             Current Medications (08/27/2022):  This is the current hospital active medication list Current Facility-Administered Medications  Medication Dose Route Frequency Provider Last Rate Last Admin   acetaminophen  (TYLENOL) tablet 650 mg  650 mg Oral Q6H PRN Vanetta Mulders, NP   650 mg at 08/22/22 2110   alum & mag hydroxide-simeth (MAALOX/MYLANTA) 200-200-20 MG/5ML suspension 30 mL  30 mL Oral Q4H PRN Gabriel Cirri F, NP   30 mL at 08/26/22 2101   ARIPiprazole ER (ABILIFY MAINTENA) injection 400 mg  400 mg Intramuscular Q28 days [name], [name], MD   400 mg at 08/22/22 1615   cloZAPine (CLOZARIL) tablet 300 mg  300 mg Oral QHS [name], [name], MD   300 mg at 08/26/22 2102   docusate sodium (COLACE) capsule 200 mg  200 mg Oral BID [name], [name], MD   200 mg at 07/27/22 0939   famotidine (PEPCID) tablet 20 mg  20 mg Oral BID [name], [name], MD   20 mg at 07/20/22 1740   guaiFENesin (ROBITUSSIN) 100 MG/5ML liquid 5 mL  5 mL Oral Q4H PRN [name], [name], MD   5 mL at 08/22/22 2110   hydrOXYzine (ATARAX) tablet 50 mg  50 mg Oral Q6H PRN [name], [name], MD   50 mg at 08/10/22 2143   magnesium citrate solution 1 Bottle  1 Bottle Oral Daily PRN Jaynie Bream, RPH   1 Bottle at 07/19/22 1643   magnesium hydroxide (MILK OF MAGNESIA) suspension 30 mL  30 mL  Oral Daily PRN Jaynie Bream, RPH   30 mL at 07/18/22 1718   mineral oil liquid   Oral Daily PRN Sarina Ill, DO   Given at 07/17/22 1201   nicotine polacrilex (NICORETTE) gum 2 mg  2 mg Oral PRN [name], [name], MD   2 mg at 04/23/22 2200   polyethylene glycol (MIRALAX / GLYCOLAX) packet 17 g  17 g Oral Daily [name], [name], MD   17 g at 08/27/22 0820   senna-docusate (Senokot-S) tablet 2 tablet  2 tablet Oral Daily PRN [name], [name], MD   2 tablet at 07/14/22 0809   ziprasidone (GEODON) injection 20 mg  20 mg Intramuscular Q12H PRN Reggie Pile, MD         Discharge Medications: Please see discharge summary for a list of discharge medications.  Relevant Imaging Results:  Relevant Lab Results:   Additional Information    Corky Crafts, LCSWA

## 2022-08-27 NOTE — Progress Notes (Signed)
Patient is ready for discharge, states he wants to go home. States that he has money and places to live. Reports a chip in his hand if he could get to a cashier and they scan his hand he could get his money. Patient did complain of indigestion. Prn given wth good relief. Patient isolative to self and room. Comes out for meds and snack. Patient in no distress.

## 2022-08-27 NOTE — Group Note (Signed)
Recreation Therapy Group Note   Group Topic:Health and Wellness  Group Date: 08/27/2022 Start Time: 1000 End Time: 1055 Facilitators: Rosina Lowenstein, LRT, CTRS Location: Courtyard  Group Description: [employer]. LRT and patients played games of basketball and drew with chalk while outside in the courtyard while getting fresh air and sunlight. Music was being played in the background. LRT and peers conversed about different games they have played before. LRT encouraged pts to drink water after being outside, sweating and getting their heart rate up.  Goal Area(s) Addressed: Patient will build on frustration tolerance skills. Patients will partake in a competitive play game with peers. Patients will gain knowledge of new leisure interest/hobby.   Affect/Mood: N/A   Participation Level: Did not attend    Clinical Observations/Individualized Feedback: Travis Sandoval did not attend group due to resting in his room.  Plan: Continue to engage patient in RT group sessions 2-3x/week.   Rosina Lowenstein, LRT, CTRS 08/27/2022 11:15 AM

## 2022-08-27 NOTE — Progress Notes (Addendum)
[facility] LCSW Progress Note   08/27/2022    2:03 PM  Travis Sandoval   [id number]   Type of Contact and Topic:  Care Coordination    CSW sent referral for group home admission to [facility]. CSW to follow up with Mariane Masters at a later time.     Signed:  Corky Crafts, MSW, LCSW, LCAS 08/27/2022 2:03 PM

## 2022-08-27 NOTE — Progress Notes (Signed)
   08/27/22 2100  Psych Admission Type (Psych Patients Only)  Admission Status Voluntary  Psychosocial Assessment  Patient Complaints None  Eye Contact Fair  Facial Expression Flat  Affect Flat  Speech Logical/coherent  Interaction Assertive;Isolative  Motor Activity Slow  Appearance/Hygiene Disheveled;Poor hygiene;In scrubs  Behavior Characteristics Appropriate to situation;Cooperative  Mood Pleasant  Aggressive Behavior  Effect No apparent injury  Thought Process  Coherency Circumstantial  Content WDL  Delusions None reported or observed  Perception Hallucinations  Hallucination Auditory  Judgment Impaired  Confusion None  Danger to Self  Agreement Not to Harm Self Yes  Description of Agreement verbal  Danger to Others  Danger to Others None reported or observed

## 2022-08-27 NOTE — Group Note (Signed)
[facility] LCSW Group Therapy Note   Group Date: 08/27/2022 Start Time: 1300 End Time: 1400  Type of Therapy/Topic:  Group Therapy:  Feelings about Diagnosis  Participation Level:  Did Not Attend   Mood: n/a   Description of Group:    This group will allow patients to explore their thoughts and feelings about diagnoses they have received. Patients will be guided to explore their level of understanding and acceptance of these diagnoses. Facilitator will encourage patients to process their thoughts and feelings about the reactions of others to their diagnosis, and will guide patients in identifying ways to discuss their diagnosis with significant others in their lives. This group will be process-oriented, with patients participating in exploration of their own experiences as well as giving and receiving support and challenge from other group members.   Therapeutic Goals: 1. Patient will demonstrate understanding of diagnosis as evidence by identifying two or more symptoms of the disorder:  2. Patient will be able to express two feelings regarding the diagnosis 3. Patient will demonstrate ability to communicate their needs through discussion and/or role plays  Summary of Patient Progress: Patient did not attend group despite encouraged participation.    Therapeutic Modalities:   Cognitive Behavioral Therapy Brief Therapy Feelings Identification    Corky Crafts, [place]

## 2022-08-27 NOTE — Progress Notes (Signed)
[facility] MD Progress Note  08/27/2022 2:26 PM Travis Sandoval  MRN:  [id number] Subjective: Follow-up 42 year old man with schizophrenia.  Up out of his bed today a little more than usual but still mostly withdrawn.  No new complaints.  He met with the representative from [facility] today and feels he got some good news. Principal Problem: Undifferentiated schizophrenia (HCC) Diagnosis: Principal Problem:   Undifferentiated schizophrenia (HCC)  Total Time spent with patient: 30 minutes  Past Psychiatric History: Past history of schizophrenia  Past Medical History:  Past Medical History:  Diagnosis Date   ADHD    Bipolar 1 disorder (HCC)    Hepatitis C    Schizoaffective disorder (HCC)    History reviewed. No pertinent surgical history. Family History:  Family History  Family history unknown: Yes   Family Psychiatric  History: See previous Social History:  Social History   Substance and Sexual Activity  Alcohol Use Not Currently     Social History   Substance and Sexual Activity  Drug Use Not Currently    Social History   Socioeconomic History   Marital status: Unknown    Spouse name: Not on file   Number of children: Not on file   Years of education: Not on file   Highest education level: Not on file  Occupational History   Not on file  Tobacco Use   Smoking status: Every Day    Packs/day: 0.25    Years: 15.00    Additional pack years: 0.00    Total pack years: 3.75    Types: Cigarettes   Smokeless tobacco: Not on file  Vaping Use   Vaping Use: Not on file  Substance and Sexual Activity   Alcohol use: Not Currently   Drug use: Not Currently   Sexual activity: Not Currently  Other Topics Concern   Not on file  Social History Narrative   Not on file   Social Determinants of Health   Financial Resource Strain: Not on file  Food Insecurity: Patient Declined (05/18/2022)   Hunger Vital Sign    Worried About Running Out of Food in the Last Year: Patient declined     Ran Out of Food in the Last Year: Patient declined  Transportation Needs: Patient Declined (05/18/2022)   PRAPARE - Administrator, Civil Service (Medical): Patient declined    Lack of Transportation (Non-Medical): Patient declined  Physical Activity: Not on file  Stress: Not on file  Social Connections: Not on file   Additional Social History:                         Sleep: Fair  Appetite:  Fair  Current Medications: Current Facility-Administered Medications  Medication Dose Route Frequency Provider Last Rate Last Admin   acetaminophen (TYLENOL) tablet 650 mg  650 mg Oral Q6H PRN Gabriel Cirri F, NP   650 mg at 08/22/22 2110   alum & mag hydroxide-simeth (MAALOX/MYLANTA) 200-200-20 MG/5ML suspension 30 mL  30 mL Oral Q4H PRN Gabriel Cirri F, NP   30 mL at 08/26/22 2101   ARIPiprazole ER (ABILIFY MAINTENA) injection 400 mg  400 mg Intramuscular Q28 days Akyra Bouchie T, MD   400 mg at 08/22/22 1615   cloZAPine (CLOZARIL) tablet 300 mg  300 mg Oral QHS Lional Icenogle T, MD   300 mg at 08/26/22 2102   docusate sodium (COLACE) capsule 200 mg  200 mg Oral BID Dreama Kuna, [name], MD   200  mg at 07/27/22 0939   famotidine (PEPCID) tablet 20 mg  20 mg Oral BID Seona Clemenson T, MD   20 mg at 07/20/22 1740   guaiFENesin (ROBITUSSIN) 100 MG/5ML liquid 5 mL  5 mL Oral Q4H PRN Alita Waldren T, MD   5 mL at 08/22/22 2110   hydrOXYzine (ATARAX) tablet 50 mg  50 mg Oral Q6H PRN Korrie Hofbauer T, MD   50 mg at 08/10/22 2143   magnesium citrate solution 1 Bottle  1 Bottle Oral Daily PRN Jaynie Bream, RPH   1 Bottle at 07/19/22 1643   magnesium hydroxide (MILK OF MAGNESIA) suspension 30 mL  30 mL Oral Daily PRN Jaynie Bream, RPH   30 mL at 07/18/22 1718   mineral oil liquid   Oral Daily PRN Sarina Ill, DO   Given at 07/17/22 1201   nicotine polacrilex (NICORETTE) gum 2 mg  2 mg Oral PRN Jerlene Rockers, [name], MD   2 mg at 04/23/22 2200   polyethylene glycol  (MIRALAX / GLYCOLAX) packet 17 g  17 g Oral Daily Ryenn Howeth, [name], MD   17 g at 08/27/22 0820   senna-docusate (Senokot-S) tablet 2 tablet  2 tablet Oral Daily PRN Isley Zinni, [name], MD   2 tablet at 07/14/22 0809   ziprasidone (GEODON) injection 20 mg  20 mg Intramuscular Q12H PRN Reggie Pile, MD        Lab Results: No results found for this or any previous visit (from the past 48 hour(s)).  Blood Alcohol level:  Lab Results  Component Value Date   ETH <10 04/15/2022    Metabolic Disorder Labs: Lab Results  Component Value Date   HGBA1C 5.5 04/24/2022   MPG 111 04/24/2022   No results found for: "PROLACTIN" Lab Results  Component Value Date   CHOL 220 (H) 04/24/2022   TRIG 700 (H) 04/24/2022   HDL 35 (L) 04/24/2022   CHOLHDL 6.3 04/24/2022   VLDL UNABLE TO CALCULATE IF TRIGLYCERIDE OVER 400 mg/dL 16/01/9603   LDLCALC UNABLE TO CALCULATE IF TRIGLYCERIDE OVER 400 mg/dL 54/12/8117    Physical Findings: AIMS: Facial and Oral Movements Muscles of Facial Expression: None, normal Lips and Perioral Area: None, normal Jaw: None, normal Tongue: None, normal,Extremity Movements Upper (arms, wrists, hands, fingers): None, normal Lower (legs, knees, ankles, toes): None, normal, Trunk Movements Neck, shoulders, hips: None, normal, Overall Severity Severity of abnormal movements (highest score from questions above): None, normal Incapacitation due to abnormal movements: None, normal Patient's awareness of abnormal movements (rate only patient's report): No Awareness, Dental Status Current problems with teeth and/or dentures?: No Does patient usually wear dentures?: No  CIWA:    COWS:     Musculoskeletal: Strength & Muscle Tone: within normal limits Gait & Station: normal Patient leans: N/A  Psychiatric Specialty Exam:  Presentation  General Appearance:  Disheveled  Eye Contact: Minimal  Speech: Pressured  Speech Volume: Normal  Handedness: Right   Mood and  Affect  Mood: Euphoric; Irritable  Affect: Inappropriate   Thought Process  Thought Processes: Disorganized  Descriptions of Associations:Loose  Orientation:Partial  Thought Content:Illogical; Scattered  History of Schizophrenia/Schizoaffective disorder:No data recorded Duration of Psychotic Symptoms:No data recorded Hallucinations:No data recorded Ideas of Reference:Delusions; Paranoia  Suicidal Thoughts:No data recorded Homicidal Thoughts:No data recorded  Sensorium  Memory: Immediate Poor; Recent Poor; Remote Poor  Judgment: Poor  Insight: Poor   Executive Functions  Concentration: Fair  Attention Span: Fair  Recall: Poor  Fund of Knowledge:  Poor  Language: Poor   Psychomotor Activity  Psychomotor Activity:No data recorded  Assets  Assets: Desire for Improvement; Social Support   Sleep  Sleep:No data recorded   Physical Exam: Physical Exam Vitals reviewed.  Constitutional:      Appearance: Normal appearance.  HENT:     Head: Normocephalic and atraumatic.     Mouth/Throat:     Pharynx: Oropharynx is clear.  Eyes:     Pupils: Pupils are equal, round, and reactive to light.  Cardiovascular:     Rate and Rhythm: Normal rate and regular rhythm.  Pulmonary:     Effort: Pulmonary effort is normal.     Breath sounds: Normal breath sounds.  Abdominal:     General: Abdomen is flat.     Palpations: Abdomen is soft.  Musculoskeletal:        General: Normal range of motion.  Skin:    General: Skin is warm and dry.  Neurological:     General: No focal deficit present.     Mental Status: He is alert. Mental status is at baseline.  Psychiatric:        Attention and Perception: He is inattentive.        Mood and Affect: Mood normal. Affect is blunt.        Speech: Speech is delayed.        Behavior: Behavior is slowed.        Thought Content: Thought content normal.    Review of Systems  Constitutional: Negative.   HENT:  Negative.    Eyes: Negative.   Respiratory: Negative.    Cardiovascular: Negative.   Gastrointestinal: Negative.   Musculoskeletal: Negative.   Skin: Negative.   Neurological: Negative.   Psychiatric/Behavioral: Negative.     Blood pressure 106/75, pulse 94, temperature 97.8 F (36.6 C), temperature source Oral, resp. rate 17, height 5\' 9"  (1.753 m), weight 96.4 kg, SpO2 99 %. Body mass index is 31.38 kg/m.   Treatment Plan Summary: Plan no change to medication.  We continue to work with the whole treatment team in the Medicaid system and trying to get his check reinstated so we can look at discharge planning  Mordecai Rasmussen, MD 08/27/2022, 2:26 PM

## 2022-08-28 DIAGNOSIS — F203 Undifferentiated schizophrenia: Secondary | ICD-10-CM | POA: Diagnosis not present

## 2022-08-28 LAB — CBC WITH DIFFERENTIAL/PLATELET
Abs Immature Granulocytes: 0.02 10*3/uL (ref 0.00–0.07)
Basophils Absolute: 0 10*3/uL (ref 0.0–0.1)
Basophils Relative: 1 %
Eosinophils Absolute: 0.1 10*3/uL (ref 0.0–0.5)
Eosinophils Relative: 2 %
HCT: 43.4 % (ref 39.0–52.0)
Hemoglobin: 14.1 g/dL (ref 13.0–17.0)
Immature Granulocytes: 0 %
Lymphocytes Relative: 42 %
Lymphs Abs: 2.5 10*3/uL (ref 0.7–4.0)
MCH: 27.9 pg (ref 26.0–34.0)
MCHC: 32.5 g/dL (ref 30.0–36.0)
MCV: 85.9 fL (ref 80.0–100.0)
Monocytes Absolute: 0.3 10*3/uL (ref 0.1–1.0)
Monocytes Relative: 5 %
Neutro Abs: 2.9 10*3/uL (ref 1.7–7.7)
Neutrophils Relative %: 50 %
Platelets: 200 10*3/uL (ref 150–400)
RBC: 5.05 MIL/uL (ref 4.22–5.81)
RDW: 13.2 % (ref 11.5–15.5)
WBC: 6 10*3/uL (ref 4.0–10.5)
nRBC: 0 % (ref 0.0–0.2)

## 2022-08-28 NOTE — Progress Notes (Signed)
[facility] MD Progress Note  08/28/2022 1:50 PM Travis Sandoval  MRN:  [id number] Subjective: Follow-up patient with schizophrenia.  No new complaints.  No behavior changes. Principal Problem: Undifferentiated schizophrenia (HCC) Diagnosis: Principal Problem:   Undifferentiated schizophrenia (HCC)  Total Time spent with patient: 30 minutes  Past Psychiatric History: Past history of schizophrenia  Past Medical History:  Past Medical History:  Diagnosis Date   ADHD    Bipolar 1 disorder (HCC)    Hepatitis C    Schizoaffective disorder (HCC)    History reviewed. No pertinent surgical history. Family History:  Family History  Family history unknown: Yes   Family Psychiatric  History: See previous Social History:  Social History   Substance and Sexual Activity  Alcohol Use Not Currently     Social History   Substance and Sexual Activity  Drug Use Not Currently    Social History   Socioeconomic History   Marital status: Unknown    Spouse name: Not on file   Number of children: Not on file   Years of education: Not on file   Highest education level: Not on file  Occupational History   Not on file  Tobacco Use   Smoking status: Every Day    Packs/day: 0.25    Years: 15.00    Additional pack years: 0.00    Total pack years: 3.75    Types: Cigarettes   Smokeless tobacco: Not on file  Vaping Use   Vaping Use: Not on file  Substance and Sexual Activity   Alcohol use: Not Currently   Drug use: Not Currently   Sexual activity: Not Currently  Other Topics Concern   Not on file  Social History Narrative   Not on file   Social Determinants of Health   Financial Resource Strain: Not on file  Food Insecurity: Patient Declined (05/18/2022)   Hunger Vital Sign    Worried About Running Out of Food in the Last Year: Patient declined    Ran Out of Food in the Last Year: Patient declined  Transportation Needs: Patient Declined (05/18/2022)   PRAPARE - Scientist, research (physical sciences) (Medical): Patient declined    Lack of Transportation (Non-Medical): Patient declined  Physical Activity: Not on file  Stress: Not on file  Social Connections: Not on file   Additional Social History:                         Sleep: Fair  Appetite:  Fair  Current Medications: Current Facility-Administered Medications  Medication Dose Route Frequency Provider Last Rate Last Admin   acetaminophen (TYLENOL) tablet 650 mg  650 mg Oral Q6H PRN Gabriel Cirri F, NP   650 mg at 08/22/22 2110   alum & mag hydroxide-simeth (MAALOX/MYLANTA) 200-200-20 MG/5ML suspension 30 mL  30 mL Oral Q4H PRN Gabriel Cirri F, NP   30 mL at 08/26/22 2101   ARIPiprazole ER (ABILIFY MAINTENA) injection 400 mg  400 mg Intramuscular Q28 days Sameera Betton T, MD   400 mg at 08/22/22 1615   cloZAPine (CLOZARIL) tablet 300 mg  300 mg Oral QHS Jacklyne Baik T, MD   300 mg at 08/27/22 2117   docusate sodium (COLACE) capsule 200 mg  200 mg Oral BID Rome Schlauch T, MD   200 mg at 07/27/22 0939   famotidine (PEPCID) tablet 20 mg  20 mg Oral BID Theoden Mauch, [name], MD   20 mg at 07/20/22 1740  guaiFENesin (ROBITUSSIN) 100 MG/5ML liquid 5 mL  5 mL Oral Q4H PRN Greig Altergott, [name], MD   5 mL at 08/22/22 2110   hydrOXYzine (ATARAX) tablet 50 mg  50 mg Oral Q6H PRN Magdaleno Lortie, [name], MD   50 mg at 08/10/22 2143   magnesium citrate solution 1 Bottle  1 Bottle Oral Daily PRN Jaynie Bream, RPH   1 Bottle at 07/19/22 1643   magnesium hydroxide (MILK OF MAGNESIA) suspension 30 mL  30 mL Oral Daily PRN Jaynie Bream, RPH   30 mL at 08/27/22 2117   mineral oil liquid   Oral Daily PRN Sarina Ill, DO   Given at 07/17/22 1201   nicotine polacrilex (NICORETTE) gum 2 mg  2 mg Oral PRN Shailee Foots, [name], MD   2 mg at 04/23/22 2200   polyethylene glycol (MIRALAX / GLYCOLAX) packet 17 g  17 g Oral Daily Rosaria Kubin, [name], MD   17 g at 08/27/22 0820   senna-docusate (Senokot-S) tablet 2 tablet  2 tablet  Oral Daily PRN Pihu Basil, [name], MD   2 tablet at 07/14/22 0809   ziprasidone (GEODON) injection 20 mg  20 mg Intramuscular Q12H PRN Reggie Pile, MD        Lab Results:  Results for orders placed or performed during the hospital encounter of 04/16/22 (from the past 48 hour(s))  CBC with Differential/Platelet     Status: None   Collection Time: 08/28/22  9:27 AM  Result Value Ref Range   WBC 6.0 4.0 - 10.5 K/uL   RBC 5.05 4.22 - 5.81 MIL/uL   Hemoglobin 14.1 13.0 - 17.0 g/dL   HCT 82.9 56.2 - 13.0 %   MCV 85.9 80.0 - 100.0 fL   MCH 27.9 26.0 - 34.0 pg   MCHC 32.5 30.0 - 36.0 g/dL   RDW 86.5 78.4 - 69.6 %   Platelets 200 150 - 400 K/uL   nRBC 0.0 0.0 - 0.2 %   Neutrophils Relative % 50 %   Neutro Abs 2.9 1.7 - 7.7 K/uL   Lymphocytes Relative 42 %   Lymphs Abs 2.5 0.7 - 4.0 K/uL   Monocytes Relative 5 %   Monocytes Absolute 0.3 0.1 - 1.0 K/uL   Eosinophils Relative 2 %   Eosinophils Absolute 0.1 0.0 - 0.5 K/uL   Basophils Relative 1 %   Basophils Absolute 0.0 0.0 - 0.1 K/uL   Immature Granulocytes 0 %   Abs Immature Granulocytes 0.02 0.00 - 0.07 K/uL    Comment: Performed at [facility], [address] [place], [place] [address]    Blood Alcohol level:  Lab Results  Component Value Date   [facility] <10 04/15/2022    Metabolic Disorder Labs: Lab Results  Component Value Date   HGBA1C 5.5 04/24/2022   MPG 111 04/24/2022   No results found for: "PROLACTIN" Lab Results  Component Value Date   CHOL 220 (H) 04/24/2022   TRIG 700 (H) 04/24/2022   HDL 35 (L) 04/24/2022   CHOLHDL 6.3 04/24/2022   VLDL UNABLE TO CALCULATE IF TRIGLYCERIDE OVER 400 mg/dL 41/32/4401   LDLCALC UNABLE TO CALCULATE IF TRIGLYCERIDE OVER 400 mg/dL 02/72/5366    Physical Findings: AIMS: Facial and Oral Movements Muscles of Facial Expression: None, normal Lips and Perioral Area: None, normal Jaw: None, normal Tongue: None, normal,Extremity Movements Upper (arms, wrists, hands, fingers):  None, normal Lower (legs, knees, ankles, toes): None, normal, Trunk Movements Neck, shoulders, hips: None, normal, Overall  Severity Severity of abnormal movements (highest score from questions above): None, normal Incapacitation due to abnormal movements: None, normal Patient's awareness of abnormal movements (rate only patient's report): No Awareness, Dental Status Current problems with teeth and/or dentures?: No Does patient usually wear dentures?: No  CIWA:    COWS:     Musculoskeletal: Strength & Muscle Tone: within normal limits Gait & Station: normal Patient leans: N/A  Psychiatric Specialty Exam:  Presentation  General Appearance:  Disheveled  Eye Contact: Minimal  Speech: Pressured  Speech Volume: Normal  Handedness: Right   Mood and Affect  Mood: Euphoric; Irritable  Affect: Inappropriate   Thought Process  Thought Processes: Disorganized  Descriptions of Associations:Loose  Orientation:Partial  Thought Content:Illogical; Scattered  History of Schizophrenia/Schizoaffective disorder:No data recorded Duration of Psychotic Symptoms:No data recorded Hallucinations:No data recorded Ideas of Reference:Delusions; Paranoia  Suicidal Thoughts:No data recorded Homicidal Thoughts:No data recorded  Sensorium  Memory: Immediate Poor; Recent Poor; Remote Poor  Judgment: Poor  Insight: Poor   Executive Functions  Concentration: Fair  Attention Span: Fair  Recall: Poor  Fund of Knowledge: Poor  Language: Poor   Psychomotor Activity  Psychomotor Activity:No data recorded  Assets  Assets: Desire for Improvement; Social Support   Sleep  Sleep:No data recorded   Physical Exam: Physical Exam Vitals and nursing note reviewed.  Constitutional:      Appearance: Normal appearance.  HENT:     Head: Normocephalic and atraumatic.     Mouth/Throat:     Pharynx: Oropharynx is clear.  Eyes:     Pupils: Pupils are equal, round,  and reactive to light.  Cardiovascular:     Rate and Rhythm: Normal rate and regular rhythm.  Pulmonary:     Effort: Pulmonary effort is normal.     Breath sounds: Normal breath sounds.  Abdominal:     General: Abdomen is flat.     Palpations: Abdomen is soft.  Musculoskeletal:        General: Normal range of motion.  Skin:    General: Skin is warm and dry.  Neurological:     General: No focal deficit present.     Mental Status: He is alert. Mental status is at baseline.  Psychiatric:        Mood and Affect: Mood normal.        Thought Content: Thought content normal.    Review of Systems  Constitutional: Negative.   HENT: Negative.    Eyes: Negative.   Respiratory: Negative.    Cardiovascular: Negative.   Gastrointestinal: Negative.   Musculoskeletal: Negative.   Skin: Negative.   Neurological: Negative.   Psychiatric/Behavioral: Negative.     Blood pressure 117/76, pulse 96, temperature 97.7 F (36.5 C), temperature source Oral, resp. rate 17, height 5\' 9"  (1.753 m), weight 96.4 kg, SpO2 98 %. Body mass index is 31.38 kg/m.   Treatment Plan Summary: Plan no change to medication or treatment plan.  Still working on discharge planning  Mordecai Rasmussen, MD 08/28/2022, 1:50 PM

## 2022-08-28 NOTE — Progress Notes (Signed)
Patient resting quietly in bed with eyes closed, Respirations equal and unlabored, skin warm and dry, NAD. Routine safety checks conducted according to facility protocol. Will continue to monitor for safety. 

## 2022-08-28 NOTE — Consult Note (Signed)
Pharmacy Consult - Clozapine     42 yo male  This patient's order has been reviewed for prescribing contraindications.    Clozapine [facility] enrollment Verified: yes on 04/17/22 [facility] patient ID: [id number] Current Outpatient Monitoring: Every 2 weeks   Home Regimen:  225 mg po BID Last dose: unknown   Dose Adjustments This Admission: Dose started at clozapine 100 mg po daily Dose is currently clozapine 350 mg po HS (started 3/12) > 300mg  qHS 08/04/2022   Labs: Date    ANC    Submitted? 12/27 2800 Yes 01/03 3500 Yes 01/10   3100 Yes 01/24 3000 Yes 01/31 4400 yes   02/07 4300 yes 02/14 4500 Yes 02/21 5500 Yes 02/28 4080 Yes 03/06 4300 Yes  03/13 4100 Yes 03/20 4800 Yes 03/27 4800 Yes 04/03 4200 Yes 04/10 5400 Yes 04/17 3570 Yes 04/24 4000 Yes 05/01 4200 0508 2900 Yes  Plan: Continue clozapine 300 mg po HS Monitor ANC at least weekly while inpatient. Next CBC with differential on 09/04/22.   Barrie Folk, PharmD Clinical Pharmacist   08/28/2022 10:01 AM

## 2022-08-28 NOTE — Group Note (Signed)
Recreation Therapy Group Note   Group Topic:Self-Esteem  Group Date: 08/28/2022 Start Time: 1000 End Time: 1050 Facilitators: Rosina Lowenstein, LRT, CTRS Location:  Craft Room  Group Description: Patients and LRT discussed the importance of self-love and self-esteem. Pt completed a worksheet that helps them identify 24 different strengths and qualities about themselves. Pt encouraged to read aloud at least 3 off their sheet to the group. LRT and pts discussed how this can be applied to daily life post-discharge.  Pt's then played "Positive Affirmation Bingo" afterwards, with journals, activity books or stress balls as bingo prizes.  Goal Area(s) Addressed: Patient will identify positive qualities about themselves. Patient will learn new positive affirmations.  Patient will recite positive qualities and affirmations aloud to the group.   Affect/Mood: N/A   Participation Level: Did not attend    Clinical Observations/Individualized Feedback: Travis Sandoval did not attend group due to resting in his room.  Plan: Continue to engage patient in RT group sessions 2-3x/week.   Rosina Lowenstein, LRT, CTRS 08/28/2022 11:17 AM

## 2022-08-28 NOTE — Plan of Care (Signed)
D- Patient alert and oriented. Patient presented in a pleasant mood on assessment reporting that he slept "good" last night and had no complaints to voice to this Clinical research associate. Patient denied SI, HI, AVH, and pain at this time. Patient also denied any signs/symptoms of depression and anxiety. Patient had no stated goals for today.  A- Patient only takes scheduled Miralax, in which he requested to take it later today, more than likely after dinner. Support and encouragement provided. Routine safety checks conducted every 15 minutes.  Patient informed to notify staff with problems or concerns.  R- Patient contracts for safety at this time. Patient compliant with some medications. Patient receptive, calm, and cooperative. Patient isolates to room, except for meals and medication. Patient remains safe at this time.  Problem: Education: Goal: Knowledge of General Education information will improve Description: Including pain rating scale, medication(s)/side effects and non-pharmacologic comfort measures Outcome: Progressing   Problem: Health Behavior/Discharge Planning: Goal: Ability to manage health-related needs will improve Outcome: Progressing   Problem: Clinical Measurements: Goal: Ability to maintain clinical measurements within normal limits will improve Outcome: Progressing Goal: Will remain free from infection Outcome: Progressing Goal: Diagnostic test results will improve Outcome: Progressing Goal: Respiratory complications will improve Outcome: Progressing Goal: Cardiovascular complication will be avoided Outcome: Progressing   Problem: Activity: Goal: Risk for activity intolerance will decrease Outcome: Progressing   Problem: Nutrition: Goal: Adequate nutrition will be maintained Outcome: Progressing   Problem: Coping: Goal: Level of anxiety will decrease Outcome: Progressing   Problem: Elimination: Goal: Will not experience complications related to urinary  retention Outcome: Progressing   Problem: Pain Managment: Goal: General experience of comfort will improve Outcome: Progressing   Problem: Safety: Goal: Ability to remain free from injury will improve Outcome: Progressing   Problem: Skin Integrity: Goal: Risk for impaired skin integrity will decrease Outcome: Progressing   Problem: Education: Goal: Knowledge of [place] General Education information/materials will improve Outcome: Progressing Goal: Emotional status will improve Outcome: Progressing Goal: Mental status will improve Outcome: Progressing Goal: Verbalization of understanding the information provided will improve Outcome: Progressing   Problem: Activity: Goal: Interest or engagement in activities will improve Outcome: Progressing Goal: Sleeping patterns will improve Outcome: Progressing   Problem: Coping: Goal: Ability to verbalize frustrations and anger appropriately will improve Outcome: Progressing Goal: Ability to demonstrate self-control will improve Outcome: Progressing   Problem: Health Behavior/Discharge Planning: Goal: Identification of resources available to assist in meeting health care needs will improve Outcome: Progressing Goal: Compliance with treatment plan for underlying cause of condition will improve Outcome: Progressing   Problem: Physical Regulation: Goal: Ability to maintain clinical measurements within normal limits will improve Outcome: Progressing   Problem: Safety: Goal: Periods of time without injury will increase Outcome: Progressing   Problem: Activity: Goal: Will verbalize the importance of balancing activity with adequate rest periods Outcome: Progressing   Problem: Education: Goal: Will be free of psychotic symptoms Outcome: Progressing Goal: Knowledge of the prescribed therapeutic regimen will improve Outcome: Progressing   Problem: Coping: Goal: Coping ability will improve Outcome: Progressing Goal: Will  verbalize feelings Outcome: Progressing   Problem: Health Behavior/Discharge Planning: Goal: Compliance with prescribed medication regimen will improve Outcome: Progressing   Problem: Nutritional: Goal: Ability to achieve adequate nutritional intake will improve Outcome: Progressing   Problem: Role Relationship: Goal: Ability to communicate needs accurately will improve Outcome: Progressing Goal: Ability to interact with others will improve Outcome: Progressing   Problem: Safety: Goal: Ability to redirect hostility and anger into  socially appropriate behaviors will improve Outcome: Progressing Goal: Ability to remain free from injury will improve Outcome: Progressing   Problem: Self-Care: Goal: Ability to participate in self-care as condition permits will improve Outcome: Progressing   Problem: Self-Concept: Goal: Will verbalize positive feelings about self Outcome: Progressing

## 2022-08-28 NOTE — Group Note (Signed)
[facility] LCSW Group Therapy Note   Group Date: 08/28/2022 Start Time: 1320 End Time: 1350   Type of Therapy/Topic:  Group Therapy:  Emotion Regulation  Participation Level:  Did Not Attend    Description of Group:    The purpose of this group is to assist patients in learning to regulate negative emotions and experience positive emotions. Patients will be guided to discuss ways in which they have been vulnerable to their negative emotions. These vulnerabilities will be juxtaposed with experiences of positive emotions or situations, and patients challenged to use positive emotions to combat negative ones. Special emphasis will be placed on coping with negative emotions in conflict situations, and patients will process healthy conflict resolution skills.  Therapeutic Goals: Patient will identify two positive emotions or experiences to reflect on in order to balance out negative emotions:  Patient will label two or more emotions that they find the most difficult to experience:  Patient will be able to demonstrate positive conflict resolution skills through discussion or role plays:   Summary of Patient Progress: X   Therapeutic Modalities:   Cognitive Behavioral Therapy Feelings Identification Dialectical Behavioral Therapy   Glenis Smoker, LCSW

## 2022-08-28 NOTE — Progress Notes (Signed)
Patient calm and pleasant during assessment denying SI/HI/AVH. Pt compliant with medication administration per MD orders. Pt given education, support, and encouragement to be active in his treatment plan. Pt being monitored Q 15 minutes for safety per unit protocol, remains safe on the unit  

## 2022-08-29 DIAGNOSIS — F203 Undifferentiated schizophrenia: Secondary | ICD-10-CM | POA: Diagnosis not present

## 2022-08-29 NOTE — Group Note (Signed)
Recreation Therapy Group Note   Group Topic:Problem Solving  Group Date: 08/29/2022 Start Time: 1000 End Time: 1055 Facilitators: Rosina Lowenstein, LRT, CTRS Location:  Craft Room  Group Description: Life Boat. Patients were given the scenario that they are on a boat that is about to become shipwrecked, leaving them stranded on an [place]. They are asked to make a list of 15 different items that they want to take with them when they are stranded on the [place]. Patients are asked to rank their items from most important to least important, #1 being the most important and #15 being the least. Patients will work individually for the first round to come up with 15 items and then pair up with a peer(s) to condense their list and come up with one list of 15 items between the two of them. Patients or LRT will read aloud the 15 different items to the group after each round. LRT facilitated post-activity processing to discuss how this activity can be used in daily life post discharge.   Goal Area(s) Addressed:  Patient will identify priorities, wants and needs. Patient will communicate with LRT and peers. Patient will work collectively as a Administrator, Civil Service. Patient will work on Product manager.   Affect/Mood: N/A   Participation Level: Did not attend    Clinical Observations/Individualized Feedback: Jaymon did not attend group due to resting in his room.  Plan: Continue to engage patient in RT group sessions 2-3x/week.   Rosina Lowenstein, LRT, CTRS 08/29/2022 11:02 AM

## 2022-08-29 NOTE — Group Note (Signed)
Date:  08/29/2022 Time:  5:26 PM  Group Topic/Focus:  Music therapy, outside courtyard     Participation Level:  Minimal  Participation Quality:  Appropriate  Affect:  Appropriate  Cognitive:  Alert and Appropriate  Insight: Appropriate and Good  Engagement in Group:  Limited  Modes of Intervention:  Activity  Additional Comments:    Doug Sou 08/29/2022, 5:26 PM

## 2022-08-29 NOTE — Plan of Care (Signed)
  Problem: Education: Goal: Knowledge of General Education information will improve Description: Including pain rating scale, medication(s)/side effects and non-pharmacologic comfort measures Outcome: Progressing   Problem: Health Behavior/Discharge Planning: Goal: Ability to manage health-related needs will improve Outcome: Progressing   Problem: Clinical Measurements: Goal: Ability to maintain clinical measurements within normal limits will improve Outcome: Progressing Goal: Will remain free from infection Outcome: Progressing Goal: Diagnostic test results will improve Outcome: Progressing Goal: Respiratory complications will improve Outcome: Progressing Goal: Cardiovascular complication will be avoided Outcome: Progressing   Problem: Activity: Goal: Risk for activity intolerance will decrease Outcome: Progressing   Problem: Nutrition: Goal: Adequate nutrition will be maintained Outcome: Progressing   Problem: Coping: Goal: Level of anxiety will decrease Outcome: Progressing   Problem: Elimination: Goal: Will not experience complications related to urinary retention Outcome: Progressing   Problem: Pain Managment: Goal: General experience of comfort will improve Outcome: Progressing   Problem: Safety: Goal: Ability to remain free from injury will improve Outcome: Progressing   Problem: Skin Integrity: Goal: Risk for impaired skin integrity will decrease Outcome: Progressing   Problem: Education: Goal: Knowledge of [place] General Education information/materials will improve Outcome: Progressing Goal: Emotional status will improve Outcome: Progressing Goal: Mental status will improve Outcome: Progressing Goal: Verbalization of understanding the information provided will improve Outcome: Progressing   Problem: Activity: Goal: Interest or engagement in activities will improve Outcome: Progressing Goal: Sleeping patterns will improve Outcome:  Progressing   Problem: Coping: Goal: Ability to verbalize frustrations and anger appropriately will improve Outcome: Progressing Goal: Ability to demonstrate self-control will improve Outcome: Progressing   Problem: Health Behavior/Discharge Planning: Goal: Identification of resources available to assist in meeting health care needs will improve Outcome: Progressing Goal: Compliance with treatment plan for underlying cause of condition will improve Outcome: Progressing   Problem: Physical Regulation: Goal: Ability to maintain clinical measurements within normal limits will improve Outcome: Progressing   Problem: Safety: Goal: Periods of time without injury will increase Outcome: Progressing   Problem: Activity: Goal: Will verbalize the importance of balancing activity with adequate rest periods Outcome: Progressing   Problem: Education: Goal: Will be free of psychotic symptoms Outcome: Progressing Goal: Knowledge of the prescribed therapeutic regimen will improve Outcome: Progressing   Problem: Coping: Goal: Coping ability will improve Outcome: Progressing Goal: Will verbalize feelings Outcome: Progressing   Problem: Health Behavior/Discharge Planning: Goal: Compliance with prescribed medication regimen will improve Outcome: Progressing   Problem: Nutritional: Goal: Ability to achieve adequate nutritional intake will improve Outcome: Progressing   Problem: Role Relationship: Goal: Ability to communicate needs accurately will improve Outcome: Progressing Goal: Ability to interact with others will improve Outcome: Progressing   Problem: Safety: Goal: Ability to redirect hostility and anger into socially appropriate behaviors will improve Outcome: Progressing Goal: Ability to remain free from injury will improve Outcome: Progressing   Problem: Self-Care: Goal: Ability to participate in self-care as condition permits will improve Outcome: Progressing    Problem: Self-Concept: Goal: Will verbalize positive feelings about self Outcome: Progressing   

## 2022-08-29 NOTE — Progress Notes (Signed)
Patient calm and pleasant during assessment denying SI/HI/AVH. Pt compliant with medication administration per MD orders. Pt given education, support, and encouragement to be active in his treatment plan. Pt being monitored Q 15 minutes for safety per unit protocol, remains safe on the unit  

## 2022-08-29 NOTE — Progress Notes (Signed)
[facility] MD Progress Note  08/29/2022 11:49 AM Travis Sandoval  MRN:  [id number] Subjective: Follow-up patient with schizophrenia.  No new complaints.  He is aware that his funding problems are starting to come together and now they are looking for placement Principal Problem: Undifferentiated schizophrenia (HCC) Diagnosis: Principal Problem:   Undifferentiated schizophrenia (HCC)  Total Time spent with patient: 15 minutes  Past Psychiatric History: History of schizophrenia  Past Medical History:  Past Medical History:  Diagnosis Date   ADHD    Bipolar 1 disorder (HCC)    Hepatitis C    Schizoaffective disorder (HCC)    History reviewed. No pertinent surgical history. Family History:  Family History  Family history unknown: Yes   Family Psychiatric  History: See previous Social History:  Social History   Substance and Sexual Activity  Alcohol Use Not Currently     Social History   Substance and Sexual Activity  Drug Use Not Currently    Social History   Socioeconomic History   Marital status: Unknown    Spouse name: Not on file   Number of children: Not on file   Years of education: Not on file   Highest education level: Not on file  Occupational History   Not on file  Tobacco Use   Smoking status: Every Day    Packs/day: 0.25    Years: 15.00    Additional pack years: 0.00    Total pack years: 3.75    Types: Cigarettes   Smokeless tobacco: Not on file  Vaping Use   Vaping Use: Not on file  Substance and Sexual Activity   Alcohol use: Not Currently   Drug use: Not Currently   Sexual activity: Not Currently  Other Topics Concern   Not on file  Social History Narrative   Not on file   Social Determinants of Health   Financial Resource Strain: Not on file  Food Insecurity: Patient Declined (05/18/2022)   Hunger Vital Sign    Worried About Running Out of Food in the Last Year: Patient declined    Ran Out of Food in the Last Year: Patient declined   Transportation Needs: Patient Declined (05/18/2022)   PRAPARE - Administrator, Civil Service (Medical): Patient declined    Lack of Transportation (Non-Medical): Patient declined  Physical Activity: Not on file  Stress: Not on file  Social Connections: Not on file   Additional Social History:                         Sleep: Fair  Appetite:  Fair  Current Medications: Current Facility-Administered Medications  Medication Dose Route Frequency Provider Last Rate Last Admin   acetaminophen (TYLENOL) tablet 650 mg  650 mg Oral Q6H PRN Gabriel Cirri F, NP   650 mg at 08/28/22 2109   alum & mag hydroxide-simeth (MAALOX/MYLANTA) 200-200-20 MG/5ML suspension 30 mL  30 mL Oral Q4H PRN Gabriel Cirri F, NP   30 mL at 08/28/22 2109   ARIPiprazole ER (ABILIFY MAINTENA) injection 400 mg  400 mg Intramuscular Q28 days Tavonte Seybold T, MD   400 mg at 08/22/22 1615   cloZAPine (CLOZARIL) tablet 300 mg  300 mg Oral QHS Kelbi Renstrom T, MD   300 mg at 08/28/22 2109   docusate sodium (COLACE) capsule 200 mg  200 mg Oral BID Manila Rommel T, MD   200 mg at 07/27/22 0939   famotidine (PEPCID) tablet 20 mg  20 mg  Oral BID Niklas Chretien, [name], MD   20 mg at 07/20/22 1740   guaiFENesin (ROBITUSSIN) 100 MG/5ML liquid 5 mL  5 mL Oral Q4H PRN Gorje Iyer, [name], MD   5 mL at 08/22/22 2110   hydrOXYzine (ATARAX) tablet 50 mg  50 mg Oral Q6H PRN Brooke Payes, [name], MD   50 mg at 08/10/22 2143   magnesium citrate solution 1 Bottle  1 Bottle Oral Daily PRN Jaynie Bream, RPH   1 Bottle at 07/19/22 1643   magnesium hydroxide (MILK OF MAGNESIA) suspension 30 mL  30 mL Oral Daily PRN Jaynie Bream, RPH   30 mL at 08/27/22 2117   mineral oil liquid   Oral Daily PRN Sarina Ill, DO   Given at 07/17/22 1201   nicotine polacrilex (NICORETTE) gum 2 mg  2 mg Oral PRN Azyah Flett, [name], MD   2 mg at 04/23/22 2200   polyethylene glycol (MIRALAX / GLYCOLAX) packet 17 g  17 g Oral Daily  Alessio Bogan, [name], MD   17 g at 08/29/22 0749   senna-docusate (Senokot-S) tablet 2 tablet  2 tablet Oral Daily PRN Dalasia Predmore, [name], MD   2 tablet at 07/14/22 0809   ziprasidone (GEODON) injection 20 mg  20 mg Intramuscular Q12H PRN Reggie Pile, MD        Lab Results:  Results for orders placed or performed during the hospital encounter of 04/16/22 (from the past 48 hour(s))  CBC with Differential/Platelet     Status: None   Collection Time: 08/28/22  9:27 AM  Result Value Ref Range   WBC 6.0 4.0 - 10.5 K/uL   RBC 5.05 4.22 - 5.81 MIL/uL   Hemoglobin 14.1 13.0 - 17.0 g/dL   HCT 16.1 09.6 - 04.5 %   MCV 85.9 80.0 - 100.0 fL   MCH 27.9 26.0 - 34.0 pg   MCHC 32.5 30.0 - 36.0 g/dL   RDW 40.9 81.1 - 91.4 %   Platelets 200 150 - 400 K/uL   nRBC 0.0 0.0 - 0.2 %   Neutrophils Relative % 50 %   Neutro Abs 2.9 1.7 - 7.7 K/uL   Lymphocytes Relative 42 %   Lymphs Abs 2.5 0.7 - 4.0 K/uL   Monocytes Relative 5 %   Monocytes Absolute 0.3 0.1 - 1.0 K/uL   Eosinophils Relative 2 %   Eosinophils Absolute 0.1 0.0 - 0.5 K/uL   Basophils Relative 1 %   Basophils Absolute 0.0 0.0 - 0.1 K/uL   Immature Granulocytes 0 %   Abs Immature Granulocytes 0.02 0.00 - 0.07 K/uL    Comment: Performed at [facility], [address] [place], [place] [address]    Blood Alcohol level:  Lab Results  Component Value Date   [facility] <10 04/15/2022    Metabolic Disorder Labs: Lab Results  Component Value Date   HGBA1C 5.5 04/24/2022   MPG 111 04/24/2022   No results found for: "PROLACTIN" Lab Results  Component Value Date   CHOL 220 (H) 04/24/2022   TRIG 700 (H) 04/24/2022   HDL 35 (L) 04/24/2022   CHOLHDL 6.3 04/24/2022   VLDL UNABLE TO CALCULATE IF TRIGLYCERIDE OVER 400 mg/dL 62/13/0865   LDLCALC UNABLE TO CALCULATE IF TRIGLYCERIDE OVER 400 mg/dL 78/46/9629    Physical Findings: AIMS: Facial and Oral Movements Muscles of Facial Expression: None, normal Lips and Perioral Area: None,  normal Jaw: None, normal Tongue: None, normal,Extremity Movements Upper (arms, wrists, hands, fingers): None, normal  Lower (legs, knees, ankles, toes): None, normal, Trunk Movements Neck, shoulders, hips: None, normal, Overall Severity Severity of abnormal movements (highest score from questions above): None, normal Incapacitation due to abnormal movements: None, normal Patient's awareness of abnormal movements (rate only patient's report): No Awareness, Dental Status Current problems with teeth and/or dentures?: No Does patient usually wear dentures?: No  CIWA:    COWS:     Musculoskeletal: Strength & Muscle Tone: within normal limits Gait & Station: normal Patient leans: N/A  Psychiatric Specialty Exam:  Presentation  General Appearance:  Disheveled  Eye Contact: Minimal  Speech: Pressured  Speech Volume: Normal  Handedness: Right   Mood and Affect  Mood: Euphoric; Irritable  Affect: Inappropriate   Thought Process  Thought Processes: Disorganized  Descriptions of Associations:Loose  Orientation:Partial  Thought Content:Illogical; Scattered  History of Schizophrenia/Schizoaffective disorder:No data recorded Duration of Psychotic Symptoms:No data recorded Hallucinations:No data recorded Ideas of Reference:Delusions; Paranoia  Suicidal Thoughts:No data recorded Homicidal Thoughts:No data recorded  Sensorium  Memory: Immediate Poor; Recent Poor; Remote Poor  Judgment: Poor  Insight: Poor   Executive Functions  Concentration: Fair  Attention Span: Fair  Recall: Poor  Fund of Knowledge: Poor  Language: Poor   Psychomotor Activity  Psychomotor Activity:No data recorded  Assets  Assets: Desire for Improvement; Social Support   Sleep  Sleep:No data recorded   Physical Exam: Physical Exam Vitals and nursing note reviewed.  Constitutional:      Appearance: Normal appearance.  HENT:     Head: Normocephalic and  atraumatic.     Mouth/Throat:     Pharynx: Oropharynx is clear.  Eyes:     Pupils: Pupils are equal, round, and reactive to light.  Cardiovascular:     Rate and Rhythm: Normal rate and regular rhythm.  Pulmonary:     Effort: Pulmonary effort is normal.     Breath sounds: Normal breath sounds.  Abdominal:     General: Abdomen is flat.     Palpations: Abdomen is soft.  Musculoskeletal:        General: Normal range of motion.  Skin:    General: Skin is warm and dry.  Neurological:     General: No focal deficit present.     Mental Status: He is alert. Mental status is at baseline.  Psychiatric:        Attention and Perception: Attention normal.        Mood and Affect: Mood normal.        Speech: Speech normal.        Behavior: Behavior normal.        Thought Content: Thought content normal.        Cognition and Memory: Cognition normal.    Review of Systems  Constitutional: Negative.   HENT: Negative.    Eyes: Negative.   Respiratory: Negative.    Cardiovascular: Negative.   Gastrointestinal: Negative.   Musculoskeletal: Negative.   Skin: Negative.   Neurological: Negative.   Psychiatric/Behavioral: Negative.     Blood pressure 102/80, pulse 90, temperature 97.6 F (36.4 C), temperature source Oral, resp. rate 20, height 5\' 9"  (1.753 m), weight 96.4 kg, SpO2 97 %. Body mass index is 31.38 kg/m.   Treatment Plan Summary: Plan no change to medication or plan.  Team now is in a phase of starting to search for places that might be willing to accept him while at the same time getting the financing together.  Still no date anticipated.  Mordecai Rasmussen, MD 08/29/2022, 11:49  AM

## 2022-08-29 NOTE — Progress Notes (Signed)
D- Patient alert and oriented x 3-4. Affect blunted/mood preoccupied.Denies SI/ HI/ AVH. Patient denies pain. Patient endorses depression and anxiety. His goal is to be "discharged to an apartment, somewhere". A- Scheduled medications administered to patient, per MD orders except Colace and Pepcid. Support and encouragement provided.  Routine safety checks conducted every 15 minutes without incident.  Patient informed to notify staff with problems or concerns and verbalizes understanding. R- No adverse drug reactions noted.  Patient compliant with medications and treatment plan. Patient receptive, calm cooperative. He isolates to room except for meals but did participate in outdoor activities today.  Patient contracts for safety and  remains safe on the unit at this time.

## 2022-08-29 NOTE — Group Note (Signed)
LCSW Group Therapy Note  Group Date: 08/29/2022 Start Time: 1300 End Time: 1400   Type of Therapy and Topic:  Group Therapy - Healthy vs Unhealthy Coping Skills  Participation Level:  Did Not Attend   Description of Group The focus of this group was to determine what unhealthy coping techniques typically are used by group members and what healthy coping techniques would be helpful in coping with various problems. Patients were guided in becoming aware of the differences between healthy and unhealthy coping techniques. Patients were asked to identify 2-3 healthy coping skills they would like to learn to use more effectively.  Therapeutic Goals Patients learned that coping is what human beings do all day long to deal with various situations in their lives Patients defined and discussed healthy vs unhealthy coping techniques Patients identified their preferred coping techniques and identified whether these were healthy or unhealthy Patients determined 2-3 healthy coping skills they would like to become more familiar with and use more often. Patients provided support and ideas to each other   Summary of Patient Progress:   Patient did not attend group despite encouraged participation.   Therapeutic Modalities Cognitive Behavioral Therapy Motivational Interviewing  Pascuala Klutts W Morgin Halls, LCSWA 08/29/2022  3:19 PM   

## 2022-08-30 DIAGNOSIS — F203 Undifferentiated schizophrenia: Secondary | ICD-10-CM | POA: Diagnosis not present

## 2022-08-30 NOTE — Group Note (Signed)
Recreation Therapy Group Note   Group Topic:Leisure Education  Group Date: 08/30/2022 Start Time: 1000 End Time: 1110 Facilitators: Rosina Lowenstein, LRT, CTRS Location:  Craft Room  Group Description: Leisure. Patients were given the option to choose from singing karaoke, making origami, using oil pastels, or playing UNO. LRT and pts discussed the meaning of leisure, the importance of participating in leisure during their free time/when they're outside of the hospital, as well as how our leisure interests can also serve as coping skills. Pt identified two leisure interests and shared with the group.   Goal Area(s) Addressed:  Patient will identify a current leisure interest.  Patient will learn the definition of "leisure". Patient will practice making a positive decision. Patient will have the opportunity to try a new leisure activity. Patient will communicate with peers and LRT.   Affect/Mood: N/A   Participation Level: Did not attend    Clinical Observations/Individualized Feedback: Travis Sandoval did not attend group due to resting in his room.  Plan: Continue to engage patient in RT group sessions 2-3x/week.   Rosina Lowenstein, LRT, CTRS 08/30/2022 11:25 AM

## 2022-08-30 NOTE — Plan of Care (Signed)
D- Patient alert and oriented. Patient presented in a pleasant mood on assessment reporting that he slept "good" last night and had no complaints to voice to this Clinical research associate. Patient denied SI, HI, AVH, and pain at this time. Patient also denied any signs/symptoms of depression and anxiety, stating "I'm good". Patient had no stated goals for today.   A- Patient only takes scheduled Miralax, in which he requested to take it later today, which will more than likely be after dinner. Support and encouragement provided. Routine safety checks conducted every 15 minutes. Patient informed to notify staff with problems or concerns.   R- Patient contracts for safety at this time. Patient compliant with some medications. Patient receptive, calm, and cooperative. Patient isolates to room, except for meals and medication. Patient remains safe at this time.  Problem: Education: Goal: Knowledge of General Education information will improve Description: Including pain rating scale, medication(s)/side effects and non-pharmacologic comfort measures Outcome: Progressing   Problem: Health Behavior/Discharge Planning: Goal: Ability to manage health-related needs will improve Outcome: Progressing   Problem: Clinical Measurements: Goal: Ability to maintain clinical measurements within normal limits will improve Outcome: Progressing Goal: Will remain free from infection Outcome: Progressing Goal: Diagnostic test results will improve Outcome: Progressing Goal: Respiratory complications will improve Outcome: Progressing Goal: Cardiovascular complication will be avoided Outcome: Progressing   Problem: Activity: Goal: Risk for activity intolerance will decrease Outcome: Progressing   Problem: Nutrition: Goal: Adequate nutrition will be maintained Outcome: Progressing   Problem: Coping: Goal: Level of anxiety will decrease Outcome: Progressing   Problem: Elimination: Goal: Will not experience complications  related to urinary retention Outcome: Progressing   Problem: Pain Managment: Goal: General experience of comfort will improve Outcome: Progressing   Problem: Safety: Goal: Ability to remain free from injury will improve Outcome: Progressing   Problem: Skin Integrity: Goal: Risk for impaired skin integrity will decrease Outcome: Progressing   Problem: Education: Goal: Knowledge of [facility] Education information/materials will improve Outcome: Progressing Goal: Emotional status will improve Outcome: Progressing Goal: Mental status will improve Outcome: Progressing Goal: Verbalization of understanding the information provided will improve Outcome: Progressing   Problem: Activity: Goal: Interest or engagement in activities will improve Outcome: Progressing Goal: Sleeping patterns will improve Outcome: Progressing   Problem: Coping: Goal: Ability to verbalize frustrations and anger appropriately will improve Outcome: Progressing Goal: Ability to demonstrate self-control will improve Outcome: Progressing   Problem: Health Behavior/Discharge Planning: Goal: Identification of resources available to assist in meeting health care needs will improve Outcome: Progressing Goal: Compliance with treatment plan for underlying cause of condition will improve Outcome: Progressing   Problem: Physical Regulation: Goal: Ability to maintain clinical measurements within normal limits will improve Outcome: Progressing   Problem: Safety: Goal: Periods of time without injury will increase Outcome: Progressing   Problem: Activity: Goal: Will verbalize the importance of balancing activity with adequate rest periods Outcome: Progressing   Problem: Education: Goal: Will be free of psychotic symptoms Outcome: Progressing Goal: Knowledge of the prescribed therapeutic regimen will improve Outcome: Progressing   Problem: Coping: Goal: Coping ability will improve Outcome:  Progressing Goal: Will verbalize feelings Outcome: Progressing   Problem: Health Behavior/Discharge Planning: Goal: Compliance with prescribed medication regimen will improve Outcome: Progressing   Problem: Nutritional: Goal: Ability to achieve adequate nutritional intake will improve Outcome: Progressing   Problem: Role Relationship: Goal: Ability to communicate needs accurately will improve Outcome: Progressing Goal: Ability to interact with others will improve Outcome: Progressing   Problem: Safety: Goal:  Ability to redirect hostility and anger into socially appropriate behaviors will improve Outcome: Progressing Goal: Ability to remain free from injury will improve Outcome: Progressing   Problem: Self-Care: Goal: Ability to participate in self-care as condition permits will improve Outcome: Progressing   Problem: Self-Concept: Goal: Will verbalize positive feelings about self Outcome: Progressing

## 2022-08-30 NOTE — BHH Counselor (Signed)
CSW met with pt this morning to assist with contacting prospective group home for placement interview. Contact was unable to be established on [employer] CSW will follow up with group home to discuss rescheduling.   Travis Sandoval. Algis Greenhouse, MSW, LCSW, LCAS 08/30/2022 4:40 PM

## 2022-08-30 NOTE — Progress Notes (Signed)
Patient asked to take scheduled medication (Miralax) later today. MD notified during progression rounds.

## 2022-08-30 NOTE — BH IP Treatment Plan (Unsigned)
Interdisciplinary Treatment and Diagnostic Plan Update  08/30/2022 Time of Session: 08:35 Travis Sandoval MRN: [id number]  Principal Diagnosis: Undifferentiated schizophrenia [facility])  Secondary Diagnoses: Principal Problem:   Undifferentiated schizophrenia (HCC)   Current Medications:  Current Facility-Administered Medications  Medication Dose Route Frequency Provider Last Rate Last Admin   acetaminophen (TYLENOL) tablet 650 mg  650 mg Oral Q6H PRN Gabriel Cirri F, NP   650 mg at 08/28/22 2109   alum & mag hydroxide-simeth (MAALOX/MYLANTA) 200-200-20 MG/5ML suspension 30 mL  30 mL Oral Q4H PRN Gabriel Cirri F, NP   30 mL at 08/29/22 2104   ARIPiprazole ER (ABILIFY MAINTENA) injection 400 mg  400 mg Intramuscular Q28 days [name], [name], MD   400 mg at 08/22/22 1615   cloZAPine (CLOZARIL) tablet 300 mg  300 mg Oral QHS [name], [name], MD   300 mg at 08/29/22 2104   docusate sodium (COLACE) capsule 200 mg  200 mg Oral BID [name], [name], MD   200 mg at 07/27/22 0939   famotidine (PEPCID) tablet 20 mg  20 mg Oral BID [name], [name], MD   20 mg at 07/20/22 1740   guaiFENesin (ROBITUSSIN) 100 MG/5ML liquid 5 mL  5 mL Oral Q4H PRN [name], [name], MD   5 mL at 08/22/22 2110   hydrOXYzine (ATARAX) tablet 50 mg  50 mg Oral Q6H PRN [name], [name], MD   50 mg at 08/10/22 2143   magnesium citrate solution 1 Bottle  1 Bottle Oral Daily PRN Jaynie Bream, RPH   1 Bottle at 07/19/22 1643   magnesium hydroxide (MILK OF MAGNESIA) suspension 30 mL  30 mL Oral Daily PRN Jaynie Bream, RPH   30 mL at 08/27/22 2117   mineral oil liquid   Oral Daily PRN Sarina Ill, DO   Given at 07/17/22 1201   nicotine polacrilex (NICORETTE) gum 2 mg  2 mg Oral PRN [name], [name], MD   2 mg at 04/23/22 2200   polyethylene glycol (MIRALAX / GLYCOLAX) packet 17 g  17 g Oral Daily [name], [name], MD   17 g at 08/29/22 0749   senna-docusate (Senokot-S) tablet 2 tablet  2 tablet Oral Daily PRN  [name], [name], MD   2 tablet at 07/14/22 0809   ziprasidone (GEODON) injection 20 mg  20 mg Intramuscular Q12H PRN Reggie Pile, MD       PTA Medications: Medications Prior to Admission  Medication Sig Dispense Refill Last Dose   ABILIFY MAINTENA 400 MG SRER injection Inject 400 mg into the muscle every 28 (twenty-eight) days.      ARIPiprazole (ABILIFY) 10 MG tablet Take 10 mg by mouth daily. (Patient not taking: Reported on 04/16/2022)      atomoxetine (STRATTERA) 40 MG capsule Take 40 mg by mouth daily. (Patient not taking: Reported on 04/16/2022)      atorvastatin (LIPITOR) 40 MG tablet Take 40 mg by mouth daily.      budesonide-formoterol (SYMBICORT) 160-4.5 MCG/ACT inhaler Inhale 2 puffs into the lungs.      cetirizine (ZYRTEC) 10 MG tablet Take 10 mg by mouth daily.      clozapine (CLOZARIL) 200 MG tablet Take 200 mg by mouth 2 (two) times daily. Take along with one 25 mg tablet for total 225 mg twice daily      cloZAPine (CLOZARIL) 25 MG tablet Take 25 mg by mouth 2 (two) times daily. Take along with one 200 mg tablet for total  225 mg twice daily      Dexlansoprazole (DEXILANT) 30 MG capsule Take 30 mg by mouth daily. (Patient not taking: Reported on 04/16/2022)      hydrOXYzine (VISTARIL) 50 MG capsule Take 50 mg by mouth 3 (three) times daily as needed for itching. (Patient not taking: Reported on 04/16/2022)      Melatonin 5 MG TABS Take 5 mg by mouth at bedtime as needed (sleep).      metoprolol succinate (TOPROL-XL) 25 MG 24 hr tablet Take 12.5 mg by mouth daily.      Phosphatidyl Choline 65 MG TABS Take 1 tablet by mouth daily.  (Patient not taking: Reported on 04/16/2022)      valproic acid (DEPAKENE) 250 MG/5ML solution Take 750-1,000 mLs by mouth 2 (two) times daily. 1000 mg (20 mL) every morning and 750 mg (15 mL) daily at bedtime       Patient Stressors: Other: Psychosis    Patient Strengths: Ability for insight  Active sense of humor  Average or above average  intelligence  Capable of independent living  Supportive family/friends   Treatment Modalities: Medication Management, Group therapy, Case management,  1 to 1 session with clinician, Psychoeducation, Recreational therapy.   Physician Treatment Plan for Primary Diagnosis: Undifferentiated schizophrenia (HCC) Long Term Goal(s):     Short Term Goals: None, pt is a poor hx.  Medication Management: Evaluate patient's response, side effects, and tolerance of medication regimen.  Therapeutic Interventions: 1 to 1 sessions, Unit Group sessions and Medication administration.  Evaluation of Outcomes: Adequate for Discharge  Physician Treatment Plan for Secondary Diagnosis: Principal Problem:   Undifferentiated schizophrenia (HCC)  Long Term Goal(s):     Short Term Goals: None, pt is a poor hx.     Medication Management: Evaluate patient's response, side effects, and tolerance of medication regimen.  Therapeutic Interventions: 1 to 1 sessions, Unit Group sessions and Medication administration.  Evaluation of Outcomes: Adequate for Discharge   RN Treatment Plan for Primary Diagnosis: Undifferentiated schizophrenia (HCC) Long Term Goal(s): Knowledge of disease and therapeutic regimen to maintain health will improve  Short Term Goals: Ability to remain free from injury will improve, Ability to verbalize frustration and anger appropriately will improve, Ability to demonstrate self-control, Ability to participate in decision making will improve, Ability to verbalize feelings will improve, Ability to disclose and discuss suicidal ideas, Ability to identify and develop effective coping behaviors will improve, and Compliance with prescribed medications will improve  Medication Management: RN will administer medications as ordered by provider, will assess and evaluate patient's response and provide education to patient for prescribed medication. RN will report any adverse and/or side effects to  prescribing provider.  Therapeutic Interventions: 1 on 1 counseling sessions, Psychoeducation, Medication administration, Evaluate responses to treatment, Monitor vital signs and CBGs as ordered, Perform/monitor CIWA, COWS, AIMS and Fall Risk screenings as ordered, Perform wound care treatments as ordered.  Evaluation of Outcomes: Adequate for Discharge   LCSW Treatment Plan for Primary Diagnosis: Undifferentiated schizophrenia (HCC) Long Term Goal(s): Safe transition to appropriate next level of care at discharge, Engage patient in therapeutic group addressing interpersonal concerns.  Short Term Goals: Engage patient in aftercare planning with referrals and resources, Increase social support, Increase ability to appropriately verbalize feelings, Increase emotional regulation, Facilitate acceptance of mental health diagnosis and concerns, Facilitate patient progression through stages of change regarding substance use diagnoses and concerns, Identify triggers associated with mental health/substance abuse issues, and Increase skills for wellness and recovery  Therapeutic  Interventions: Assess for all discharge needs, 1 to 1 time with Child psychotherapist, Explore available resources and support systems, Assess for adequacy in community support network, Educate family and significant other(s) on suicide prevention, Complete Psychosocial Assessment, Interpersonal group therapy.  Evaluation of Outcomes: Adequate for Discharge   Progress in Treatment: Attending groups: No. Participating in groups: No. Taking medication as prescribed: Yes. Toleration medication: Yes. Family/Significant other contact made: Yes, individual(s) contacted:  father, Khyden Davin. Patient understands diagnosis: No. Discussing patient identified problems/goals with staff: Yes. Medical problems stabilized or resolved: Yes. Denies suicidal/homicidal ideation: Yes. Issues/concerns per patient self-inventory: No. Other:  none.  New problem(s) identified: No, Describe:  none Update 06/30/2021:  No changes at this time. Update 07/06/2022:  No changes at this time. Update 07/11/22: No changes at this time. Update 07/16/22: none. Update 07/21/22: none. Update 07/26/22: No changes at this time.  Update 07/31/2022:  No changes at this time.  Update 08/05/2022: No change at this time. Update 08/10/22: No changes at this time. Update 08/15/22: No changes at this time. Update 08/20/22: none. Update 08/25/22: none. Update 08/30/22: No changes at this time.   New Short Term/Long Term Goal(s): elimination of symptoms of psychosis, medication management for mood stabilization; elimination of SI thoughts; development of comprehensive mental wellness plan. Update 06/30/2021:  No changes at this time. Update 07/06/2022:  No changes at this time. Update 07/11/22: No changes at this time. Update 07/16/22: Patient to work towards elimination of symptoms of psychosis, medication management for mood stabilization; development of comprehensive mental wellness plan. Update 07/21/22: Patient to work towards elimination of symptoms of psychosis, medication management for mood stabilization; development of comprehensive mental wellness plan. Update 07/26/22: No changes at this time.  Update 07/31/2022:  No changes at this time.  Update 08/05/2022: No change at this time. Update 08/10/22: No changes at this time. Update 08/15/22: No changes at this time. Update 08/20/22:  Patient to work towards elimination of symptoms of psychosis, medication management for mood stabilization; development of comprehensive mental wellness plan. Update 08/25/22: no additional goals identified at this time. Update 08/30/22: No changes at this time.   Patient Goals:  No additional treatment goals identified by patient. Update 06/30/2021:  No changes at this time. Update 07/06/2022:  No changes at this time. Update 07/11/22: No changes at this time. Update 07/16/22: No additional goals identified at this  time. Patient to continue to work towards original goals identified in initial treatment team meeting. CSW will remain available to patient should they voice additional treatment goals. Update 07/21/22: No additional goals identified at this time. Patient to continue to work towards original goals identified in initial treatment team meeting. CSW will remain available to patient should they voice additional treatment goals. Update 07/26/22: No changes at this time.  Update 07/31/2022:  No changes at this time. Update 08/05/2022: No change at this time. Update 08/10/22: No changes at this time. Update 08/15/22: No changes at this time. Update 08/20/22: No additional goals identified at this time. Patient to continue to work towards original goals identified in initial treatment team meeting. CSW will remain available to patient should they voice additional treatment goals. Update 08/25/22: no additional goals identified at this time. Update 08/30/22: No changes at this time.   Discharge Plan or Barriers: Patient remains on the unit as placement continues to be sought. Funding remains the concern at this time and biggest barrier to placement at this time. Update 06/30/2021:  No changes at this time.  Update 07/06/2022:  No changes at this time. Update 07/11/22: No changes at this time. Update 07/16/22: Patient lacks funding for placement, [employer] rep continues to communicate with social security to restart payment. Patient has guardianship hearing on this day. Situation ongoing, CSW will continue to monitor and update note as more information becomes available. Update 07/21/22: Patient lacks adequate funding, CSW to continue pursing payor source to secure placement. Patient is unable to living independently per psychiatrist. Update 07/26/22: No changes at this time. Update 07/31/2022:  No changes at this time.  Update 08/05/2022: No change at this time. Update 08/10/22: No changes at this time. Update 08/15/22: No changes at this time.  Update 08/20/22: Patient lack funding to procure group home placement. Per note on 4/29, "CSW reached [facility] regarding update on payee application. [name], DSS Guardian Supervisor, at [phone] [phone] reports the patient has been awarded [employer], although the social security office has not yet awarded an amount. CSW clarified if DSS has been appointed as the patient's payee at this time. [name] reports they have not been appointed as the payee yet. CSW encouraged DSS to apply or have someone appointed payee as soon as possible in order to avoid any delays once social security has determined an amount." Update 08/25/22: Patient is waiting on insurance. Update 08/30/22: Pt has an interview for potential placement option today at 10:30. CSW to assist with virtual interview.    Reason for Continuation of Hospitalization: Pt is boarding.   Estimated Length of Stay: TBD Update 07/06/2022:  TBD Update 07/11/22: No changes at this time. Update 07/16/22: TBD. Update 07/21/22: TBD. Update 07/26/22: No changes at this time.  Update 07/31/2022:  No changes at this time.  Update 08/05/2022: No change at this time. Update 08/10/22: No changes at this time. Update 08/15/22: No changes at this time. Update 08/20/22: TBD. Update 08/25/22: TBD. Update 08/30/22: No changes at this time.  Last 3 [place] Suicide Severity Risk Score: Flowsheet Row Admission (Current) from 04/16/2022 in [facility] ED from 04/15/2022 in [facility] Emergency Department at [facility]  C-SSRS RISK CATEGORY No Risk No Risk       Last PHQ 2/9 Scores:     No data to display          Scribe for Treatment Team: Glenis Smoker, LCSW 08/30/2022 9:53 AM

## 2022-08-30 NOTE — Progress Notes (Signed)
[facility] MD Progress Note  08/30/2022 2:28 PM Travis Sandoval  MRN:  [id number] Subjective: Patient seen and chart reviewed.  No new new complaint.  No change in behavior. Principal Problem: Undifferentiated schizophrenia (HCC) Diagnosis: Principal Problem:   Undifferentiated schizophrenia (HCC)  Total Time spent with patient: 30 minutes  Past Psychiatric History: Past history of schizophrenia  Past Medical History:  Past Medical History:  Diagnosis Date   ADHD    Bipolar 1 disorder (HCC)    Hepatitis C    Schizoaffective disorder (HCC)    History reviewed. No pertinent surgical history. Family History:  Family History  Family history unknown: Yes   Family Psychiatric  History: See previous Social History:  Social History   Substance and Sexual Activity  Alcohol Use Not Currently     Social History   Substance and Sexual Activity  Drug Use Not Currently    Social History   Socioeconomic History   Marital status: Unknown    Spouse name: Not on file   Number of children: Not on file   Years of education: Not on file   Highest education level: Not on file  Occupational History   Not on file  Tobacco Use   Smoking status: Every Day    Packs/day: 0.25    Years: 15.00    Additional pack years: 0.00    Total pack years: 3.75    Types: Cigarettes   Smokeless tobacco: Not on file  Vaping Use   Vaping Use: Not on file  Substance and Sexual Activity   Alcohol use: Not Currently   Drug use: Not Currently   Sexual activity: Not Currently  Other Topics Concern   Not on file  Social History Narrative   Not on file   Social Determinants of Health   Financial Resource Strain: Not on file  Food Insecurity: Patient Declined (05/18/2022)   Hunger Vital Sign    Worried About Running Out of Food in the Last Year: Patient declined    Ran Out of Food in the Last Year: Patient declined  Transportation Needs: Patient Declined (05/18/2022)   PRAPARE - Scientist, research (physical sciences) (Medical): Patient declined    Lack of Transportation (Non-Medical): Patient declined  Physical Activity: Not on file  Stress: Not on file  Social Connections: Not on file   Additional Social History:                         Sleep: Fair  Appetite:  Fair  Current Medications: Current Facility-Administered Medications  Medication Dose Route Frequency Provider Last Rate Last Admin   acetaminophen (TYLENOL) tablet 650 mg  650 mg Oral Q6H PRN Gabriel Cirri F, NP   650 mg at 08/28/22 2109   alum & mag hydroxide-simeth (MAALOX/MYLANTA) 200-200-20 MG/5ML suspension 30 mL  30 mL Oral Q4H PRN Gabriel Cirri F, NP   30 mL at 08/29/22 2104   ARIPiprazole ER (ABILIFY MAINTENA) injection 400 mg  400 mg Intramuscular Q28 days Erminie Foulks T, MD   400 mg at 08/22/22 1615   cloZAPine (CLOZARIL) tablet 300 mg  300 mg Oral QHS Rosemaria Inabinet T, MD   300 mg at 08/29/22 2104   docusate sodium (COLACE) capsule 200 mg  200 mg Oral BID Graden Hoshino T, MD   200 mg at 07/27/22 0939   famotidine (PEPCID) tablet 20 mg  20 mg Oral BID Usbaldo Pannone, [name], MD   20 mg at 07/20/22  1740   guaiFENesin (ROBITUSSIN) 100 MG/5ML liquid 5 mL  5 mL Oral Q4H PRN Keelie Zemanek T, MD   5 mL at 08/22/22 2110   hydrOXYzine (ATARAX) tablet 50 mg  50 mg Oral Q6H PRN Neleh Muldoon T, MD   50 mg at 08/10/22 2143   magnesium citrate solution 1 Bottle  1 Bottle Oral Daily PRN Jaynie Bream, RPH   1 Bottle at 07/19/22 1643   magnesium hydroxide (MILK OF MAGNESIA) suspension 30 mL  30 mL Oral Daily PRN Jaynie Bream, RPH   30 mL at 08/27/22 2117   mineral oil liquid   Oral Daily PRN Sarina Ill, DO   Given at 07/17/22 1201   nicotine polacrilex (NICORETTE) gum 2 mg  2 mg Oral PRN Contrina Orona, [name], MD   2 mg at 04/23/22 2200   polyethylene glycol (MIRALAX / GLYCOLAX) packet 17 g  17 g Oral Daily Herley Bernardini, [name], MD   17 g at 08/29/22 0749   senna-docusate (Senokot-S) tablet 2 tablet  2 tablet  Oral Daily PRN Tigran Haynie, [name], MD   2 tablet at 07/14/22 0809   ziprasidone (GEODON) injection 20 mg  20 mg Intramuscular Q12H PRN Reggie Pile, MD        Lab Results: No results found for this or any previous visit (from the past 48 hour(s)).  Blood Alcohol level:  Lab Results  Component Value Date   ETH <10 04/15/2022    Metabolic Disorder Labs: Lab Results  Component Value Date   HGBA1C 5.5 04/24/2022   MPG 111 04/24/2022   No results found for: "PROLACTIN" Lab Results  Component Value Date   CHOL 220 (H) 04/24/2022   TRIG 700 (H) 04/24/2022   HDL 35 (L) 04/24/2022   CHOLHDL 6.3 04/24/2022   VLDL UNABLE TO CALCULATE IF TRIGLYCERIDE OVER 400 mg/dL 16/01/9603   LDLCALC UNABLE TO CALCULATE IF TRIGLYCERIDE OVER 400 mg/dL 54/12/8117    Physical Findings: AIMS: Facial and Oral Movements Muscles of Facial Expression: None, normal Lips and Perioral Area: None, normal Jaw: None, normal Tongue: None, normal,Extremity Movements Upper (arms, wrists, hands, fingers): None, normal Lower (legs, knees, ankles, toes): None, normal, Trunk Movements Neck, shoulders, hips: None, normal, Overall Severity Severity of abnormal movements (highest score from questions above): None, normal Incapacitation due to abnormal movements: None, normal Patient's awareness of abnormal movements (rate only patient's report): No Awareness, Dental Status Current problems with teeth and/or dentures?: No Does patient usually wear dentures?: No  CIWA:    COWS:     Musculoskeletal: Strength & Muscle Tone: within normal limits Gait & Station: normal Patient leans: N/A  Psychiatric Specialty Exam:  Presentation  General Appearance:  Disheveled  Eye Contact: Minimal  Speech: Pressured  Speech Volume: Normal  Handedness: Right   Mood and Affect  Mood: Euphoric; Irritable  Affect: Inappropriate   Thought Process  Thought Processes: Disorganized  Descriptions of  Associations:Loose  Orientation:Partial  Thought Content:Illogical; Scattered  History of Schizophrenia/Schizoaffective disorder:No data recorded Duration of Psychotic Symptoms:No data recorded Hallucinations:No data recorded Ideas of Reference:Delusions; Paranoia  Suicidal Thoughts:No data recorded Homicidal Thoughts:No data recorded  Sensorium  Memory: Immediate Poor; Recent Poor; Remote Poor  Judgment: Poor  Insight: Poor   Executive Functions  Concentration: Fair  Attention Span: Fair  Recall: Poor  Fund of Knowledge: Poor  Language: Poor   Psychomotor Activity  Psychomotor Activity:No data recorded  Assets  Assets: Desire for Improvement; Social Support   Sleep  Sleep:No data recorded   Physical Exam: Physical Exam Vitals and nursing note reviewed.  Constitutional:      Appearance: Normal appearance.  HENT:     Head: Normocephalic and atraumatic.     Mouth/Throat:     Pharynx: Oropharynx is clear.  Eyes:     Pupils: Pupils are equal, round, and reactive to light.  Cardiovascular:     Rate and Rhythm: Normal rate and regular rhythm.  Pulmonary:     Effort: Pulmonary effort is normal.     Breath sounds: Normal breath sounds.  Abdominal:     General: Abdomen is flat.     Palpations: Abdomen is soft.  Musculoskeletal:        General: Normal range of motion.  Skin:    General: Skin is warm and dry.  Neurological:     General: No focal deficit present.     Mental Status: He is alert. Mental status is at baseline.  Psychiatric:        Attention and Perception: Attention normal.        Mood and Affect: Mood normal. Affect is blunt.        Speech: Speech normal.        Behavior: Behavior is withdrawn.        Thought Content: Thought content normal.        Cognition and Memory: Cognition normal.    Review of Systems  Constitutional: Negative.   HENT: Negative.    Eyes: Negative.   Respiratory: Negative.    Cardiovascular:  Negative.   Gastrointestinal: Negative.   Musculoskeletal: Negative.   Skin: Negative.   Neurological: Negative.    Blood pressure (!) 137/91, pulse 96, temperature 97.8 F (36.6 C), temperature source Oral, resp. rate 20, height 5\' 9"  (1.753 m), weight 96.4 kg, SpO2 98 %. Body mass index is 31.38 kg/m.   Treatment Plan Summary: Medication management and Plan no change to treatment plan.  Patient is waiting for someone to find a place for him to live.  Mordecai Rasmussen, MD 08/30/2022, 2:28 PM

## 2022-08-31 DIAGNOSIS — F203 Undifferentiated schizophrenia: Secondary | ICD-10-CM | POA: Diagnosis not present

## 2022-08-31 NOTE — Progress Notes (Signed)
[facility] MD Progress Note  08/31/2022 11:35 AM Nussen Wingerter  MRN:  [id number] Subjective: Patient seen.  No new complaints.  No report of any grossly psychotic thoughts no behavior problems no physical complaints. Principal Problem: Undifferentiated schizophrenia (HCC) Diagnosis: Principal Problem:   Undifferentiated schizophrenia (HCC)  Total Time spent with patient: 15 minutes  Past Psychiatric History: Past history of schizophrenia  Past Medical History:  Past Medical History:  Diagnosis Date   ADHD    Bipolar 1 disorder (HCC)    Hepatitis C    Schizoaffective disorder (HCC)    History reviewed. No pertinent surgical history. Family History:  Family History  Family history unknown: Yes   Family Psychiatric  History: See previous Social History:  Social History   Substance and Sexual Activity  Alcohol Use Not Currently     Social History   Substance and Sexual Activity  Drug Use Not Currently    Social History   Socioeconomic History   Marital status: Unknown    Spouse name: Not on file   Number of children: Not on file   Years of education: Not on file   Highest education level: Not on file  Occupational History   Not on file  Tobacco Use   Smoking status: Every Day    Packs/day: 0.25    Years: 15.00    Additional pack years: 0.00    Total pack years: 3.75    Types: Cigarettes   Smokeless tobacco: Not on file  Vaping Use   Vaping Use: Not on file  Substance and Sexual Activity   Alcohol use: Not Currently   Drug use: Not Currently   Sexual activity: Not Currently  Other Topics Concern   Not on file  Social History Narrative   Not on file   Social Determinants of Health   Financial Resource Strain: Not on file  Food Insecurity: Patient Declined (05/18/2022)   Hunger Vital Sign    Worried About Running Out of Food in the Last Year: Patient declined    Ran Out of Food in the Last Year: Patient declined  Transportation Needs: Patient Declined  (05/18/2022)   PRAPARE - Administrator, Civil Service (Medical): Patient declined    Lack of Transportation (Non-Medical): Patient declined  Physical Activity: Not on file  Stress: Not on file  Social Connections: Not on file   Additional Social History:                         Sleep: Fair  Appetite:  Fair  Current Medications: Current Facility-Administered Medications  Medication Dose Route Frequency Provider Last Rate Last Admin   acetaminophen (TYLENOL) tablet 650 mg  650 mg Oral Q6H PRN Gabriel Cirri F, NP   650 mg at 08/28/22 2109   alum & mag hydroxide-simeth (MAALOX/MYLANTA) 200-200-20 MG/5ML suspension 30 mL  30 mL Oral Q4H PRN Gabriel Cirri F, NP   30 mL at 08/29/22 2104   ARIPiprazole ER (ABILIFY MAINTENA) injection 400 mg  400 mg Intramuscular Q28 days Camdin Hegner T, MD   400 mg at 08/22/22 1615   cloZAPine (CLOZARIL) tablet 300 mg  300 mg Oral QHS Gracee Ratterree T, MD   300 mg at 08/30/22 2135   docusate sodium (COLACE) capsule 200 mg  200 mg Oral BID Charlcie Prisco, [name], MD   200 mg at 07/27/22 0939   famotidine (PEPCID) tablet 20 mg  20 mg Oral BID Mashell Sieben, [name], MD  20 mg at 07/20/22 1740   guaiFENesin (ROBITUSSIN) 100 MG/5ML liquid 5 mL  5 mL Oral Q4H PRN Amiley Shishido T, MD   5 mL at 08/22/22 2110   hydrOXYzine (ATARAX) tablet 50 mg  50 mg Oral Q6H PRN Daimion Adamcik T, MD   50 mg at 08/10/22 2143   magnesium citrate solution 1 Bottle  1 Bottle Oral Daily PRN Jaynie Bream, RPH   1 Bottle at 07/19/22 1643   magnesium hydroxide (MILK OF MAGNESIA) suspension 30 mL  30 mL Oral Daily PRN Jaynie Bream, RPH   30 mL at 08/27/22 2117   mineral oil liquid   Oral Daily PRN Sarina Ill, DO   Given at 07/17/22 1201   nicotine polacrilex (NICORETTE) gum 2 mg  2 mg Oral PRN Alondra Vandeven, [name], MD   2 mg at 04/23/22 2200   polyethylene glycol (MIRALAX / GLYCOLAX) packet 17 g  17 g Oral Daily Andreus Cure, [name], MD   17 g at 08/31/22 0834    senna-docusate (Senokot-S) tablet 2 tablet  2 tablet Oral Daily PRN Ginamarie Banfield, [name], MD   2 tablet at 07/14/22 0809   ziprasidone (GEODON) injection 20 mg  20 mg Intramuscular Q12H PRN Reggie Pile, MD        Lab Results: No results found for this or any previous visit (from the past 48 hour(s)).  Blood Alcohol level:  Lab Results  Component Value Date   ETH <10 04/15/2022    Metabolic Disorder Labs: Lab Results  Component Value Date   HGBA1C 5.5 04/24/2022   MPG 111 04/24/2022   No results found for: "PROLACTIN" Lab Results  Component Value Date   CHOL 220 (H) 04/24/2022   TRIG 700 (H) 04/24/2022   HDL 35 (L) 04/24/2022   CHOLHDL 6.3 04/24/2022   VLDL UNABLE TO CALCULATE IF TRIGLYCERIDE OVER 400 mg/dL 96/07/5407   LDLCALC UNABLE TO CALCULATE IF TRIGLYCERIDE OVER 400 mg/dL 81/19/1478    Physical Findings: AIMS: Facial and Oral Movements Muscles of Facial Expression: None, normal Lips and Perioral Area: None, normal Jaw: None, normal Tongue: None, normal,Extremity Movements Upper (arms, wrists, hands, fingers): None, normal Lower (legs, knees, ankles, toes): None, normal, Trunk Movements Neck, shoulders, hips: None, normal, Overall Severity Severity of abnormal movements (highest score from questions above): None, normal Incapacitation due to abnormal movements: None, normal Patient's awareness of abnormal movements (rate only patient's report): No Awareness, Dental Status Current problems with teeth and/or dentures?: No Does patient usually wear dentures?: No  CIWA:    COWS:     Musculoskeletal: Strength & Muscle Tone: within normal limits Gait & Station: normal Patient leans: N/A  Psychiatric Specialty Exam:  Presentation  General Appearance:  Disheveled  Eye Contact: Minimal  Speech: Pressured  Speech Volume: Normal  Handedness: Right   Mood and Affect  Mood: Euphoric; Irritable  Affect: Inappropriate   Thought Process  Thought  Processes: Disorganized  Descriptions of Associations:Loose  Orientation:Partial  Thought Content:Illogical; Scattered  History of Schizophrenia/Schizoaffective disorder:No data recorded Duration of Psychotic Symptoms:No data recorded Hallucinations:No data recorded Ideas of Reference:Delusions; Paranoia  Suicidal Thoughts:No data recorded Homicidal Thoughts:No data recorded  Sensorium  Memory: Immediate Poor; Recent Poor; Remote Poor  Judgment: Poor  Insight: Poor   Executive Functions  Concentration: Fair  Attention Span: Fair  Recall: Poor  Fund of Knowledge: Poor  Language: Poor   Psychomotor Activity  Psychomotor Activity:No data recorded  Assets  Assets: Desire for Improvement; Social Support  Sleep  Sleep:No data recorded   Physical Exam: Physical Exam Vitals and nursing note reviewed.  Constitutional:      Appearance: Normal appearance.  HENT:     Head: Normocephalic and atraumatic.     Mouth/Throat:     Pharynx: Oropharynx is clear.  Eyes:     Pupils: Pupils are equal, round, and reactive to light.  Cardiovascular:     Rate and Rhythm: Normal rate and regular rhythm.  Pulmonary:     Effort: Pulmonary effort is normal.     Breath sounds: Normal breath sounds.  Abdominal:     General: Abdomen is flat.     Palpations: Abdomen is soft.  Musculoskeletal:        General: Normal range of motion.  Skin:    General: Skin is warm and dry.  Neurological:     General: No focal deficit present.     Mental Status: He is alert. Mental status is at baseline.  Psychiatric:        Mood and Affect: Mood normal.        Thought Content: Thought content normal.    Review of Systems  Constitutional: Negative.   HENT: Negative.    Eyes: Negative.   Respiratory: Negative.    Cardiovascular: Negative.   Gastrointestinal: Negative.   Musculoskeletal: Negative.   Skin: Negative.   Neurological: Negative.   Psychiatric/Behavioral:  Negative.     Blood pressure 122/79, pulse 97, temperature 97.8 F (36.6 C), temperature source Oral, resp. rate 18, height 5\' 9"  (1.753 m), weight 96.4 kg, SpO2 97 %. Body mass index is 31.38 kg/m.   Treatment Plan Summary: Medication management and Plan no change to medication.  Continue to focus on discharge planning  Mordecai Rasmussen, MD 08/31/2022, 11:35 AM

## 2022-08-31 NOTE — Group Note (Signed)
Date:  08/31/2022 Time:  8:51 AM  Group Topic/Focus:  Outdoor Recreation/Leisure    Participation Level:  Minimal  Participation Quality:  Appropriate  Affect:  Appropriate  Cognitive:  Appropriate  Insight: Limited  Engagement in Group:  Limited  Modes of Intervention:  Activity  Additional Comments:    Lynelle Smoke Soumya Colson 08/31/2022, 8:51 AM

## 2022-08-31 NOTE — [facility] Group Notes (Signed)
[facility] Group Notes:  (Nursing/MHT/Case Management/Adjunct)  Date:  08/31/2022  Time:  11:05 AM  Type of Therapy:  Psychoeducational Skills  Participation Level:  Did Not Attend  Participation Quality:   na  Affect:   na  Cognitive:   na  Insight:  None  Engagement in Group:   na  Modes of Intervention:   na  Summary of Progress/Problems:  Travis Sandoval 08/31/2022, 11:05 AM

## 2022-08-31 NOTE — Progress Notes (Signed)
Patient alert and oriented x 4, affect is flat but brightens upon approach, he denies SI/HI/AVH, he is interacting appropriately with peers and staff. 15 minutes safety checks maintained will continue to monitor closely. .  

## 2022-08-31 NOTE — Progress Notes (Signed)
   08/31/22 2100  Psych Admission Type (Psych Patients Only)  Admission Status Voluntary  Psychosocial Assessment  Patient Complaints None  Eye Contact Fair  Facial Expression Flat  Affect Depressed  Speech Logical/coherent  Interaction Avoidant;Isolative  Motor Activity Slow  Appearance/Hygiene Body odor;Disheveled  Behavior Characteristics Cooperative  Mood Depressed  Thought Process  Coherency WDL  Content WDL  Delusions WDL  Perception Hallucinations  Hallucination Auditory  Judgment Impaired  Confusion None  Danger to Self  Current suicidal ideation? Denies  Agreement Not to Harm Self Yes  Description of Agreement Verbal  Danger to Others  Danger to Others None reported or observed

## 2022-08-31 NOTE — [facility] Group Notes (Signed)
[facility] Group Notes:  (Nursing/MHT/Case Management/Adjunct)  Date:  08/31/2022  Time:  10:54 AM  Type of Therapy:  Psychoeducational Skills  Participation Level:  Did Not Attend  Participation Quality:   na  Affect:   na  Cognitive:   na  Insight:  None  Engagement in Group:   na  Modes of Intervention:   na  Summary of Progress/Problems: Pt did not attend.   Malva Limes 08/31/2022, 10:54 AM

## 2022-08-31 NOTE — Progress Notes (Signed)
D- Patient alert and oriented x 3-4. Affect flat/mood preoccupied. Denies SI/ HI/ AVH. Patient denies pain. Patient denies depression and anxiety.  A- Scheduled medications administered to patient, per MD orders except for Colace and Pepcid. Support and encouragement provided.  Routine safety checks conducted every 15 minutes without incident.  Patient informed to notify staff with problems or concerns and verbalizes understanding. R- No adverse drug reactions noted.  Patient compliant with treatment plan. Patient receptive, calm and cooperative . He isolates to his room except for meals. Patient contracts for safety and  remains safe on the unit at this time.

## 2022-08-31 NOTE — Group Note (Signed)
Date:  08/31/2022 Time:  5:37 PM  Group Topic/Focus:  Outdoor Recreation/Activity    Participation Level:  Minimal  Participation Quality:  Appropriate  Affect:  Appropriate  Cognitive:  Appropriate  Insight: Limited  Engagement in Group:  Limited  Modes of Intervention:  Activity  Additional Comments:    Wilford Corner 08/31/2022, 5:37 PM

## 2022-08-31 NOTE — Group Note (Signed)
LCSW Group Therapy Note   Group Date: 08/31/2022 Start Time: 1310 End Time: 1400   Type of Therapy and Topic:  Group Therapy: Wellness  Participation Level:  Did Not Attend   Summary of Patient Progress:  The patient did not attend group.     Marshell Levan, LCSWA 08/31/2022  2:55 PM

## 2022-08-31 NOTE — Plan of Care (Signed)
  Problem: Education: Goal: Knowledge of General Education information will improve Description: Including pain rating scale, medication(s)/side effects and non-pharmacologic comfort measures Outcome: Progressing   Problem: Health Behavior/Discharge Planning: Goal: Ability to manage health-related needs will improve Outcome: Progressing   Problem: Clinical Measurements: Goal: Ability to maintain clinical measurements within normal limits will improve Outcome: Progressing Goal: Will remain free from infection Outcome: Progressing Goal: Diagnostic test results will improve Outcome: Progressing Goal: Respiratory complications will improve Outcome: Progressing Goal: Cardiovascular complication will be avoided Outcome: Progressing   Problem: Activity: Goal: Risk for activity intolerance will decrease Outcome: Progressing   Problem: Nutrition: Goal: Adequate nutrition will be maintained Outcome: Progressing   Problem: Coping: Goal: Level of anxiety will decrease Outcome: Progressing   Problem: Elimination: Goal: Will not experience complications related to urinary retention Outcome: Progressing   Problem: Pain Managment: Goal: General experience of comfort will improve Outcome: Progressing   Problem: Safety: Goal: Ability to remain free from injury will improve Outcome: Progressing   Problem: Skin Integrity: Goal: Risk for impaired skin integrity will decrease Outcome: Progressing   Problem: Education: Goal: Knowledge of [place] [facility] Education information/materials will improve Outcome: Progressing Goal: Emotional status will improve Outcome: Progressing Goal: Mental status will improve Outcome: Progressing Goal: Verbalization of understanding the information provided will improve Outcome: Progressing   Problem: Activity: Goal: Interest or engagement in activities will improve Outcome: Progressing Goal: Sleeping patterns will improve Outcome:  Progressing   Problem: Coping: Goal: Ability to verbalize frustrations and anger appropriately will improve Outcome: Progressing Goal: Ability to demonstrate self-control will improve Outcome: Progressing   Problem: Health Behavior/Discharge Planning: Goal: Identification of resources available to assist in meeting health care needs will improve Outcome: Progressing Goal: Compliance with treatment plan for underlying cause of condition will improve Outcome: Progressing   Problem: Physical Regulation: Goal: Ability to maintain clinical measurements within normal limits will improve Outcome: Progressing   Problem: Safety: Goal: Periods of time without injury will increase Outcome: Progressing   Problem: Activity: Goal: Will verbalize the importance of balancing activity with adequate rest periods Outcome: Progressing   Problem: Education: Goal: Will be free of psychotic symptoms Outcome: Progressing Goal: Knowledge of the prescribed therapeutic regimen will improve Outcome: Progressing   Problem: Coping: Goal: Coping ability will improve Outcome: Progressing Goal: Will verbalize feelings Outcome: Progressing   Problem: Health Behavior/Discharge Planning: Goal: Compliance with prescribed medication regimen will improve Outcome: Progressing   Problem: Nutritional: Goal: Ability to achieve adequate nutritional intake will improve Outcome: Progressing   Problem: Role Relationship: Goal: Ability to communicate needs accurately will improve Outcome: Progressing Goal: Ability to interact with others will improve Outcome: Progressing   Problem: Safety: Goal: Ability to redirect hostility and anger into socially appropriate behaviors will improve Outcome: Progressing Goal: Ability to remain free from injury will improve Outcome: Progressing   Problem: Self-Care: Goal: Ability to participate in self-care as condition permits will improve Outcome: Progressing    Problem: Self-Concept: Goal: Will verbalize positive feelings about self Outcome: Progressing   

## 2022-09-01 DIAGNOSIS — F203 Undifferentiated schizophrenia: Secondary | ICD-10-CM | POA: Diagnosis not present

## 2022-09-01 NOTE — Progress Notes (Signed)
D- Patient alert and oriented x3-4. Affect flat/mood preoccupied.. Denies SI/ HI/ AVH but often see him in his room talking. Patient denies pain. He denies depression and anxiety. A- Scheduled medications administered to patient, per MD orders except Colace and Pepcid. Support and encouragement provided.  Routine safety checks conducted every 15 minutes without incident.  Patient informed to notify staff with problems or concerns and verbalizes understanding. R- No adverse drug reactions noted.  Patient compliant treatment plan. Patient receptive, calm and cooperative. He is isolative to his room except for meals. He did participate in outdoor activity today.  Patient contracts for safety and  remains safe on the unit at this time.

## 2022-09-01 NOTE — Progress Notes (Signed)
   09/01/22 0552  15 Minute Checks  Location Bedroom  Visual Appearance Calm  Behavior Sleeping  Sleep (Behavioral Health Patients Only)  Calculate sleep? (Click Yes once per 24 hr at 0600 safety check) Yes  Documented sleep last 24 hours 8.75

## 2022-09-01 NOTE — Plan of Care (Signed)
  Problem: Education: Goal: Knowledge of General Education information will improve Description: Including pain rating scale, medication(s)/side effects and non-pharmacologic comfort measures Outcome: Progressing   Problem: Health Behavior/Discharge Planning: Goal: Ability to manage health-related needs will improve Outcome: Progressing   Problem: Clinical Measurements: Goal: Ability to maintain clinical measurements within normal limits will improve Outcome: Progressing Goal: Will remain free from infection Outcome: Progressing Goal: Diagnostic test results will improve Outcome: Progressing Goal: Respiratory complications will improve Outcome: Progressing Goal: Cardiovascular complication will be avoided Outcome: Progressing   Problem: Activity: Goal: Risk for activity intolerance will decrease Outcome: Progressing   Problem: Nutrition: Goal: Adequate nutrition will be maintained Outcome: Progressing   Problem: Coping: Goal: Level of anxiety will decrease Outcome: Progressing   Problem: Elimination: Goal: Will not experience complications related to urinary retention Outcome: Progressing   Problem: Pain Managment: Goal: General experience of comfort will improve Outcome: Progressing   Problem: Safety: Goal: Ability to remain free from injury will improve Outcome: Progressing   Problem: Skin Integrity: Goal: Risk for impaired skin integrity will decrease Outcome: Progressing   Problem: Education: Goal: Knowledge of [place] [facility] Education information/materials will improve Outcome: Progressing Goal: Emotional status will improve Outcome: Progressing Goal: Mental status will improve Outcome: Progressing Goal: Verbalization of understanding the information provided will improve Outcome: Progressing   Problem: Activity: Goal: Interest or engagement in activities will improve Outcome: Progressing Goal: Sleeping patterns will improve Outcome:  Progressing   Problem: Coping: Goal: Ability to verbalize frustrations and anger appropriately will improve Outcome: Progressing Goal: Ability to demonstrate self-control will improve Outcome: Progressing   Problem: Health Behavior/Discharge Planning: Goal: Identification of resources available to assist in meeting health care needs will improve Outcome: Progressing Goal: Compliance with treatment plan for underlying cause of condition will improve Outcome: Progressing   Problem: Physical Regulation: Goal: Ability to maintain clinical measurements within normal limits will improve Outcome: Progressing   Problem: Safety: Goal: Periods of time without injury will increase Outcome: Progressing   Problem: Activity: Goal: Will verbalize the importance of balancing activity with adequate rest periods Outcome: Progressing   Problem: Education: Goal: Will be free of psychotic symptoms Outcome: Progressing Goal: Knowledge of the prescribed therapeutic regimen will improve Outcome: Progressing   Problem: Coping: Goal: Coping ability will improve Outcome: Progressing Goal: Will verbalize feelings Outcome: Progressing   Problem: Health Behavior/Discharge Planning: Goal: Compliance with prescribed medication regimen will improve Outcome: Progressing   Problem: Nutritional: Goal: Ability to achieve adequate nutritional intake will improve Outcome: Progressing   Problem: Role Relationship: Goal: Ability to communicate needs accurately will improve Outcome: Progressing Goal: Ability to interact with others will improve Outcome: Progressing   Problem: Safety: Goal: Ability to redirect hostility and anger into socially appropriate behaviors will improve Outcome: Progressing Goal: Ability to remain free from injury will improve Outcome: Progressing   Problem: Self-Care: Goal: Ability to participate in self-care as condition permits will improve Outcome: Progressing    Problem: Self-Concept: Goal: Will verbalize positive feelings about self Outcome: Progressing   

## 2022-09-01 NOTE — Progress Notes (Signed)
[facility] MD Progress Note  09/01/2022 12:07 PM Travis Sandoval  MRN:  [id number] Subjective: Follow-up 42 year old with schizophrenia.  No new complaints.  No changes to behavior. Principal Problem: Undifferentiated schizophrenia (HCC) Diagnosis: Principal Problem:   Undifferentiated schizophrenia (HCC)  Total Time spent with patient: 30 minutes  Past Psychiatric History: Past history of schizophrenia  Past Medical History:  Past Medical History:  Diagnosis Date   ADHD    Bipolar 1 disorder (HCC)    Hepatitis C    Schizoaffective disorder (HCC)    History reviewed. No pertinent surgical history. Family History:  Family History  Family history unknown: Yes   Family Psychiatric  History: None reported Social History:  Social History   Substance and Sexual Activity  Alcohol Use Not Currently     Social History   Substance and Sexual Activity  Drug Use Not Currently    Social History   Socioeconomic History   Marital status: Unknown    Spouse name: Not on file   Number of children: Not on file   Years of education: Not on file   Highest education level: Not on file  Occupational History   Not on file  Tobacco Use   Smoking status: Every Day    Packs/day: 0.25    Years: 15.00    Additional pack years: 0.00    Total pack years: 3.75    Types: Cigarettes   Smokeless tobacco: Not on file  Vaping Use   Vaping Use: Not on file  Substance and Sexual Activity   Alcohol use: Not Currently   Drug use: Not Currently   Sexual activity: Not Currently  Other Topics Concern   Not on file  Social History Narrative   Not on file   Social Determinants of Health   Financial Resource Strain: Not on file  Food Insecurity: Patient Declined (05/18/2022)   Hunger Vital Sign    Worried About Running Out of Food in the Last Year: Patient declined    Ran Out of Food in the Last Year: Patient declined  Transportation Needs: Patient Declined (05/18/2022)   PRAPARE - Therapist, art (Medical): Patient declined    Lack of Transportation (Non-Medical): Patient declined  Physical Activity: Not on file  Stress: Not on file  Social Connections: Not on file   Additional Social History:                         Sleep: Fair  Appetite:  Fair  Current Medications: Current Facility-Administered Medications  Medication Dose Route Frequency Provider Last Rate Last Admin   acetaminophen (TYLENOL) tablet 650 mg  650 mg Oral Q6H PRN Gabriel Cirri F, NP   650 mg at 08/28/22 2109   alum & mag hydroxide-simeth (MAALOX/MYLANTA) 200-200-20 MG/5ML suspension 30 mL  30 mL Oral Q4H PRN Gabriel Cirri F, NP   30 mL at 08/29/22 2104   ARIPiprazole ER (ABILIFY MAINTENA) injection 400 mg  400 mg Intramuscular Q28 days Kenedy Haisley T, MD   400 mg at 08/22/22 1615   cloZAPine (CLOZARIL) tablet 300 mg  300 mg Oral QHS Mishelle Hassan T, MD   300 mg at 08/31/22 2048   docusate sodium (COLACE) capsule 200 mg  200 mg Oral BID Jenasia Dolinar T, MD   200 mg at 07/27/22 0939   famotidine (PEPCID) tablet 20 mg  20 mg Oral BID Berdell Nevitt, [name], MD   20 mg at 07/20/22 1740  guaiFENesin (ROBITUSSIN) 100 MG/5ML liquid 5 mL  5 mL Oral Q4H PRN Jodeci Rini T, MD   5 mL at 08/22/22 2110   hydrOXYzine (ATARAX) tablet 50 mg  50 mg Oral Q6H PRN Brooks Stotz T, MD   50 mg at 08/10/22 2143   magnesium citrate solution 1 Bottle  1 Bottle Oral Daily PRN Jaynie Bream, RPH   1 Bottle at 07/19/22 1643   magnesium hydroxide (MILK OF MAGNESIA) suspension 30 mL  30 mL Oral Daily PRN Jaynie Bream, RPH   30 mL at 08/27/22 2117   mineral oil liquid   Oral Daily PRN Sarina Ill, DO   Given at 07/17/22 1201   nicotine polacrilex (NICORETTE) gum 2 mg  2 mg Oral PRN Miko Markwood, [name], MD   2 mg at 04/23/22 2200   polyethylene glycol (MIRALAX / GLYCOLAX) packet 17 g  17 g Oral Daily Earlyn Sylvan, [name], MD   17 g at 09/01/22 1122   senna-docusate (Senokot-S) tablet 2 tablet  2  tablet Oral Daily PRN Havannah Streat, [name], MD   2 tablet at 07/14/22 0809   ziprasidone (GEODON) injection 20 mg  20 mg Intramuscular Q12H PRN Reggie Pile, MD        Lab Results: No results found for this or any previous visit (from the past 48 hour(s)).  Blood Alcohol level:  Lab Results  Component Value Date   ETH <10 04/15/2022    Metabolic Disorder Labs: Lab Results  Component Value Date   HGBA1C 5.5 04/24/2022   MPG 111 04/24/2022   No results found for: "PROLACTIN" Lab Results  Component Value Date   CHOL 220 (H) 04/24/2022   TRIG 700 (H) 04/24/2022   HDL 35 (L) 04/24/2022   CHOLHDL 6.3 04/24/2022   VLDL UNABLE TO CALCULATE IF TRIGLYCERIDE OVER 400 mg/dL 16/01/9603   LDLCALC UNABLE TO CALCULATE IF TRIGLYCERIDE OVER 400 mg/dL 54/12/8117    Physical Findings: AIMS: Facial and Oral Movements Muscles of Facial Expression: None, normal Lips and Perioral Area: None, normal Jaw: None, normal Tongue: None, normal,Extremity Movements Upper (arms, wrists, hands, fingers): None, normal Lower (legs, knees, ankles, toes): None, normal, Trunk Movements Neck, shoulders, hips: None, normal, Overall Severity Severity of abnormal movements (highest score from questions above): None, normal Incapacitation due to abnormal movements: None, normal Patient's awareness of abnormal movements (rate only patient's report): No Awareness, Dental Status Current problems with teeth and/or dentures?: No Does patient usually wear dentures?: No  CIWA:    COWS:     Musculoskeletal: Strength & Muscle Tone: within normal limits Gait & Station: normal Patient leans: N/A  Psychiatric Specialty Exam:  Presentation  General Appearance:  Disheveled  Eye Contact: Minimal  Speech: Pressured  Speech Volume: Normal  Handedness: Right   Mood and Affect  Mood: Euphoric; Irritable  Affect: Inappropriate   Thought Process  Thought Processes: Disorganized  Descriptions of  Associations:Loose  Orientation:Partial  Thought Content:Illogical; Scattered  History of Schizophrenia/Schizoaffective disorder:No data recorded Duration of Psychotic Symptoms:No data recorded Hallucinations:No data recorded Ideas of Reference:Delusions; Paranoia  Suicidal Thoughts:No data recorded Homicidal Thoughts:No data recorded  Sensorium  Memory: Immediate Poor; Recent Poor; Remote Poor  Judgment: Poor  Insight: Poor   Executive Functions  Concentration: Fair  Attention Span: Fair  Recall: Poor  Fund of Knowledge: Poor  Language: Poor   Psychomotor Activity  Psychomotor Activity:No data recorded  Assets  Assets: Desire for Improvement; Social Support   Sleep  Sleep:No data recorded  Physical Exam: Physical Exam Vitals and nursing note reviewed.  Constitutional:      Appearance: Normal appearance.  HENT:     Head: Normocephalic and atraumatic.     Mouth/Throat:     Pharynx: Oropharynx is clear.  Eyes:     Pupils: Pupils are equal, round, and reactive to light.  Cardiovascular:     Rate and Rhythm: Normal rate and regular rhythm.  Pulmonary:     Effort: Pulmonary effort is normal.     Breath sounds: Normal breath sounds.  Abdominal:     General: Abdomen is flat.     Palpations: Abdomen is soft.  Musculoskeletal:        General: Normal range of motion.  Skin:    General: Skin is warm and dry.  Neurological:     General: No focal deficit present.     Mental Status: He is alert. Mental status is at baseline.  Psychiatric:        Mood and Affect: Mood normal.        Thought Content: Thought content normal.    Review of Systems  Constitutional: Negative.   HENT: Negative.    Eyes: Negative.   Respiratory: Negative.    Cardiovascular: Negative.   Gastrointestinal: Negative.   Musculoskeletal: Negative.   Skin: Negative.   Neurological: Negative.   Psychiatric/Behavioral: Negative.     Blood pressure 109/79, pulse (!)  101, temperature 98 F (36.7 C), temperature source Oral, resp. rate 18, height 5\' 9"  (1.753 m), weight 96.4 kg, SpO2 97 %. Body mass index is 31.38 kg/m.   Treatment Plan Summary: Medication management and Plan as usual patient spends most of his time in his room either reclining in bed or sitting up on the edge of the bed.  Pleasant to talk with but does not come out very much.  No complaints.  He is generally aware of the situation that the system is trying to get his money ascertained and to find a place for him to live.  Mordecai Rasmussen, MD 09/01/2022, 12:07 PM

## 2022-09-02 DIAGNOSIS — F203 Undifferentiated schizophrenia: Secondary | ICD-10-CM | POA: Diagnosis not present

## 2022-09-02 NOTE — [facility] Group Notes (Signed)
[facility] Group Notes:  (Nursing/MHT/Case Management/Adjunct)  Date:  09/02/2022  Time:  10:09 AM  Type of Therapy:  Psychoeducational Skills  Participation Level:  Did Not Attend  Participation Quality:   na  Affect:   na  Cognitive:   na  Insight:  None  Engagement in Group:   na  Modes of Intervention:   na  Summary of Progress/Problems:  Teaching on identifying warning signs of mental health crisis, and how to implement healthy lifestyle/healthy coping to maintain mental health. Pt did not attend.  Travis Sandoval 09/02/2022, 10:09 AM

## 2022-09-02 NOTE — Group Note (Signed)
[facility] LCSW Group Therapy Note    Group Date: 09/02/2022 Start Time: 1311 End Time: 1345  Type of Therapy and Topic:  Group Therapy:  Overcoming Obstacles  Participation Level:  [facility] PARTICIPATION LEVEL: Did Not Attend   Description of Group:   In this group patients will be encouraged to explore what they see as obstacles to their own wellness and recovery. They will be guided to discuss their thoughts, feelings, and behaviors related to these obstacles. The group will process together ways to cope with barriers, with attention given to specific choices patients can make. Each patient will be challenged to identify changes they are motivated to make in order to overcome their obstacles. This group will be process-oriented, with patients participating in exploration of their own experiences as well as giving and receiving support and challenge from other group members.  Therapeutic Goals: 1. Patient will identify personal and current obstacles as they relate to admission. 2. Patient will identify barriers that currently interfere with their wellness or overcoming obstacles.  3. Patient will identify feelings, thought process and behaviors related to these barriers. 4. Patient will identify two changes they are willing to make to overcome these obstacles:    Summary of Patient Progress X   Therapeutic Modalities:   Cognitive Behavioral Therapy Solution Focused Therapy Motivational Interviewing Relapse Prevention Therapy   Ruhani Umland R Nazanin Kinner, LCSW 

## 2022-09-02 NOTE — Plan of Care (Signed)
  Problem: Education: Goal: Knowledge of General Education information will improve Description: Including pain rating scale, medication(s)/side effects and non-pharmacologic comfort measures Outcome: Progressing   Problem: Nutrition: Goal: Adequate nutrition will be maintained Outcome: Progressing   Problem: Coping: Goal: Level of anxiety will decrease Outcome: Progressing   Problem: Safety: Goal: Ability to remain free from injury will improve Outcome: Progressing   Problem: Safety: Goal: Ability to remain free from injury will improve Outcome: Progressing   Problem: Safety: Goal: Ability to remain free from injury will improve Outcome: Progressing   Problem: Education: Goal: Knowledge of [facility] Education information/materials will improve Outcome: Progressing Goal: Emotional status will improve Outcome: Progressing   Problem: Coping: Goal: Ability to verbalize frustrations and anger appropriately will improve Outcome: Progressing Goal: Ability to demonstrate self-control will improve Outcome: Progressing

## 2022-09-02 NOTE — Progress Notes (Signed)
Patient isolative to self and room. Out for snack and meds. Request maalox for indigestion. Given with good relief. Denies SI, Hi, AVh. Medication compliant.  Encouragement and support provided, safety checks maintained. Medications given as prescribed. Pt receptive  and remains safe on unit with q 15 min checks.

## 2022-09-02 NOTE — Progress Notes (Signed)
Patient ID: Travis Sandoval, male   DOB: Jun 15, 1980, 42 y.o.   MRN: [id number] Pt presents with depressed mood, affect congruent. Square has been observed responding to internal stimuli at times. He denies hearing voices with Clinical research associate, denies any acute concerns . Pt denies any depression, hopelessness or anxiety. He reports he is sleeping and eating well. Pt refused am medications stating '' I don't need that I am going to the bathroom fine '' Pt is safe, able to make needs known. Will con't to monitor.

## 2022-09-02 NOTE — Group Note (Signed)
Recreation Therapy Group Note   Group Topic:Goal Setting  Group Date: 09/02/2022 Start Time: 1005 End Time: 1110 Facilitators: Rosina Lowenstein, LRT, CTRS Location:  Craft Room  Group Description: Vision Board. Patients were given many different magazines, a glue stick, markers, and a piece of cardstock paper. LRT and pts discussed the importance of having goals in life. LRT and pts discussed the difference between short-term and long-term goals, as well as what a SMART goal is. LRT encouraged pts to create a vision board, with images they picked and then cut out with safety scissors from the magazine, for themselves, that capture their short and long-term goals. LRT encouraged pts to show and explain their vision board to the group. LRT offered to laminate vision board once dry and complete.   Goal Area(s) Addressed:  Patient will gain knowledge of short vs. long term goals.  Patient will identify goals for themselves. Patient will practice setting SMART goals. Patient will verbalize their goals to LRT and peers.  Affect/Mood: N/A   Participation Level: Did not attend    Clinical Observations/Individualized Feedback: Travis Sandoval did not attend group due to resting in his room.  Plan: Continue to engage patient in RT group sessions 2-3x/week.   Rosina Lowenstein, LRT, CTRS 09/02/2022 11:52 AM

## 2022-09-02 NOTE — Progress Notes (Signed)
Patient alert and oriented x 4, affect is flat but brightens upon approach, he denies SI/HI/AVH, he is interacting appropriately with peers and staff. 15 minutes safety checks maintained will continue to monitor closely. Marland Kitchen

## 2022-09-02 NOTE — Progress Notes (Signed)
[facility] MD Progress Note  09/02/2022 11:24 AM Travis Sandoval  MRN:  [id number] Subjective: Follow-up patient with undifferentiated schizophrenia.  Continues to stay isolated most of the time.  No new complaints. Principal Problem: Undifferentiated schizophrenia (HCC) Diagnosis: Principal Problem:   Undifferentiated schizophrenia (HCC)  Total Time spent with patient: 30 minutes  Past Psychiatric History: Past history of schizophrenia  Past Medical History:  Past Medical History:  Diagnosis Date   ADHD    Bipolar 1 disorder (HCC)    Hepatitis C    Schizoaffective disorder (HCC)    History reviewed. No pertinent surgical history. Family History:  Family History  Family history unknown: Yes   Family Psychiatric  History: See previous Social History:  Social History   Substance and Sexual Activity  Alcohol Use Not Currently     Social History   Substance and Sexual Activity  Drug Use Not Currently    Social History   Socioeconomic History   Marital status: Unknown    Spouse name: Not on file   Number of children: Not on file   Years of education: Not on file   Highest education level: Not on file  Occupational History   Not on file  Tobacco Use   Smoking status: Every Day    Packs/day: 0.25    Years: 15.00    Additional pack years: 0.00    Total pack years: 3.75    Types: Cigarettes   Smokeless tobacco: Not on file  Vaping Use   Vaping Use: Not on file  Substance and Sexual Activity   Alcohol use: Not Currently   Drug use: Not Currently   Sexual activity: Not Currently  Other Topics Concern   Not on file  Social History Narrative   Not on file   Social Determinants of Health   Financial Resource Strain: Not on file  Food Insecurity: Patient Declined (05/18/2022)   Hunger Vital Sign    Worried About Running Out of Food in the Last Year: Patient declined    Ran Out of Food in the Last Year: Patient declined  Transportation Needs: Patient Declined  (05/18/2022)   PRAPARE - Administrator, Civil Service (Medical): Patient declined    Lack of Transportation (Non-Medical): Patient declined  Physical Activity: Not on file  Stress: Not on file  Social Connections: Not on file   Additional Social History:                         Sleep: Fair  Appetite:  Fair  Current Medications: Current Facility-Administered Medications  Medication Dose Route Frequency Provider Last Rate Last Admin   acetaminophen (TYLENOL) tablet 650 mg  650 mg Oral Q6H PRN Gabriel Cirri F, NP   650 mg at 08/28/22 2109   alum & mag hydroxide-simeth (MAALOX/MYLANTA) 200-200-20 MG/5ML suspension 30 mL  30 mL Oral Q4H PRN Gabriel Cirri F, NP   30 mL at 09/01/22 2127   ARIPiprazole ER (ABILIFY MAINTENA) injection 400 mg  400 mg Intramuscular Q28 days Letha Mirabal T, MD   400 mg at 08/22/22 1615   cloZAPine (CLOZARIL) tablet 300 mg  300 mg Oral QHS Lakenya Riendeau T, MD   300 mg at 09/01/22 2127   docusate sodium (COLACE) capsule 200 mg  200 mg Oral BID Seon Gaertner T, MD   200 mg at 07/27/22 0939   famotidine (PEPCID) tablet 20 mg  20 mg Oral BID Conleigh Heinlein, [name], MD   20  mg at 07/20/22 1740   guaiFENesin (ROBITUSSIN) 100 MG/5ML liquid 5 mL  5 mL Oral Q4H PRN Karrah Mangini T, MD   5 mL at 08/22/22 2110   hydrOXYzine (ATARAX) tablet 50 mg  50 mg Oral Q6H PRN Keigan Tafoya, [name], MD   50 mg at 08/10/22 2143   magnesium citrate solution 1 Bottle  1 Bottle Oral Daily PRN Jaynie Bream, RPH   1 Bottle at 07/19/22 1643   magnesium hydroxide (MILK OF MAGNESIA) suspension 30 mL  30 mL Oral Daily PRN Jaynie Bream, RPH   30 mL at 08/27/22 2117   mineral oil liquid   Oral Daily PRN Sarina Ill, DO   Given at 07/17/22 1201   nicotine polacrilex (NICORETTE) gum 2 mg  2 mg Oral PRN Shaquinta Peruski, [name], MD   2 mg at 04/23/22 2200   polyethylene glycol (MIRALAX / GLYCOLAX) packet 17 g  17 g Oral Daily Rosealyn Little, [name], MD   17 g at 09/01/22 1122    senna-docusate (Senokot-S) tablet 2 tablet  2 tablet Oral Daily PRN Ayub Kirsh, [name], MD   2 tablet at 07/14/22 0809   ziprasidone (GEODON) injection 20 mg  20 mg Intramuscular Q12H PRN Reggie Pile, MD        Lab Results: No results found for this or any previous visit (from the past 48 hour(s)).  Blood Alcohol level:  Lab Results  Component Value Date   ETH <10 04/15/2022    Metabolic Disorder Labs: Lab Results  Component Value Date   HGBA1C 5.5 04/24/2022   MPG 111 04/24/2022   No results found for: "PROLACTIN" Lab Results  Component Value Date   CHOL 220 (H) 04/24/2022   TRIG 700 (H) 04/24/2022   HDL 35 (L) 04/24/2022   CHOLHDL 6.3 04/24/2022   VLDL UNABLE TO CALCULATE IF TRIGLYCERIDE OVER 400 mg/dL 16/01/9603   LDLCALC UNABLE TO CALCULATE IF TRIGLYCERIDE OVER 400 mg/dL 54/12/8117    Physical Findings: AIMS: Facial and Oral Movements Muscles of Facial Expression: None, normal Lips and Perioral Area: None, normal Jaw: None, normal Tongue: None, normal,Extremity Movements Upper (arms, wrists, hands, fingers): None, normal Lower (legs, knees, ankles, toes): None, normal, Trunk Movements Neck, shoulders, hips: None, normal, Overall Severity Severity of abnormal movements (highest score from questions above): None, normal Incapacitation due to abnormal movements: None, normal Patient's awareness of abnormal movements (rate only patient's report): No Awareness, Dental Status Current problems with teeth and/or dentures?: No Does patient usually wear dentures?: No  CIWA:    COWS:     Musculoskeletal: Strength & Muscle Tone: within normal limits Gait & Station: normal Patient leans: N/A  Psychiatric Specialty Exam:  Presentation  General Appearance:  Disheveled  Eye Contact: Minimal  Speech: Pressured  Speech Volume: Normal  Handedness: Right   Mood and Affect  Mood: Euphoric; Irritable  Affect: Inappropriate   Thought Process  Thought  Processes: Disorganized  Descriptions of Associations:Loose  Orientation:Partial  Thought Content:Illogical; Scattered  History of Schizophrenia/Schizoaffective disorder:No data recorded Duration of Psychotic Symptoms:No data recorded Hallucinations:No data recorded Ideas of Reference:Delusions; Paranoia  Suicidal Thoughts:No data recorded Homicidal Thoughts:No data recorded  Sensorium  Memory: Immediate Poor; Recent Poor; Remote Poor  Judgment: Poor  Insight: Poor   Executive Functions  Concentration: Fair  Attention Span: Fair  Recall: Poor  Fund of Knowledge: Poor  Language: Poor   Psychomotor Activity  Psychomotor Activity:No data recorded  Assets  Assets: Desire for Improvement; Social Support  Sleep  Sleep:No data recorded   Physical Exam: Physical Exam Vitals and nursing note reviewed.  Constitutional:      Appearance: Normal appearance.  HENT:     Head: Normocephalic and atraumatic.     Mouth/Throat:     Pharynx: Oropharynx is clear.  Eyes:     Pupils: Pupils are equal, round, and reactive to light.  Cardiovascular:     Rate and Rhythm: Normal rate and regular rhythm.  Pulmonary:     Effort: Pulmonary effort is normal.     Breath sounds: Normal breath sounds.  Abdominal:     General: Abdomen is flat.     Palpations: Abdomen is soft.  Musculoskeletal:        General: Normal range of motion.  Skin:    General: Skin is warm and dry.  Neurological:     General: No focal deficit present.     Mental Status: He is alert. Mental status is at baseline.  Psychiatric:        Attention and Perception: Attention normal.        Mood and Affect: Mood normal. Affect is blunt.        Speech: Speech normal.        Behavior: Behavior is slowed.        Thought Content: Thought content normal.    Review of Systems  Constitutional: Negative.   HENT: Negative.    Eyes: Negative.   Respiratory: Negative.    Cardiovascular: Negative.    Gastrointestinal: Negative.   Musculoskeletal: Negative.   Skin: Negative.   Neurological: Negative.   Psychiatric/Behavioral: Negative.     Blood pressure 105/74, pulse 85, temperature 97.8 F (36.6 C), temperature source Oral, resp. rate 20, height 5\' 9"  (1.753 m), weight 96.4 kg, SpO2 98 %. Body mass index is 31.38 kg/m.   Treatment Plan Summary: Medication management and Plan no change to current medication.  We continue to work on discharge planning  Mordecai Rasmussen, MD 09/02/2022, 11:24 AM

## 2022-09-03 DIAGNOSIS — F203 Undifferentiated schizophrenia: Secondary | ICD-10-CM | POA: Diagnosis not present

## 2022-09-03 NOTE — Group Note (Signed)
Recreation Therapy Group Note   Group Topic:Coping Skills  Group Date: 09/03/2022 Start Time: 1000 End Time: 1050 Facilitators: Rosina Lowenstein, LRT, CTRS Location:  Craft Room  Group Description: Mind Map.  Patient was provided a blank template of a diagram with 32 blank boxes in a tiered system, branching from the center (similar to a bubble chart). LRT directed patients to label the middle of the diagram "Coping Skills". LRT and patients then came up with 8 different coping skills as examples. Pt were directed to record their coping skills in the 2nd tier boxes closest to the center.  Patients would then share their coping skills with the group as LRT wrote them out. LRT gave a handout of 100 different coping skills at the end of group.   Goal Area(s) Addressed: Patients will be able to define "coping skills". Patient will identify new coping skills.  Patient will identify new possible leisure interests.   Affect/Mood: N/A   Participation Level: Did not attend    Clinical Observations/Individualized Feedback: Travis Sandoval did not attend group due to resting in his room.  Plan: Continue to engage patient in RT group sessions 2-3x/week.   Rosina Lowenstein, LRT, CTRS 09/03/2022 11:08 AM

## 2022-09-03 NOTE — Plan of Care (Signed)

## 2022-09-03 NOTE — [facility] Group Notes (Signed)
[facility] Group Notes:  (Nursing/MHT/Case Management/Adjunct)  Date:  09/03/2022  Time:  10:32 AM  Type of Therapy:  Psychoeducational Skills  Participation Level:  Did Not Attend  Participation Quality:   na  Affect:   na  Cognitive:   na  Insight:  None  Engagement in Group:   na  Modes of Intervention:   na  Summary of Progress/Problems:  Malva Limes 09/03/2022, 10:32 AM

## 2022-09-03 NOTE — [facility] Counselor (Signed)
CSW assisted patient in completing an interview for a group home.   Patient was engaged and appropriate for majority of interview.   However, the patient informed the group home that he can not go to [place], where the home is located.  He reports that he is only open to homes in [facility].  He reports that he can not go to [place] "because of the [employer]".   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 He reports that he can't explain further because those present for the interview do not know Latin.   While patient stated that he can not participate in the home, he was polite.  Patient stated several times that he "doesn't break the law".  Patient stated that he was interested in the home but the one spot against the choice is that it is located in [place].  Participants on the call were: [facility] TOC Alvy Beal- Sharol Harness- [facility] owner  Penni Homans, MSW, LCSW 09/03/2022 1:52 PM

## 2022-09-03 NOTE — Progress Notes (Signed)
[facility] MD Progress Note  09/03/2022 10:43 AM [place]  MRN:  [id number] Subjective: Follow-up patient with schizophrenia.  Patient continues to stay in his room most of the time.  Hygiene is not the best.  No aggressive behavior however.  Compliant with medicine.  Spoke with representative from the local agency.  Patient has a telephone interview with a possible placement today.  It would be in [place].  Apparently when he was told of this he immediately rejected the idea saying [place]  Patient is aware that the representative has sought out places in those counties without any success so far.  In the course of discussing this [place] talked about how he still owned property in [place] and made some other odd comments that suggested that he is still delusional although typically does not act on it during the course of the day. Principal Problem: Undifferentiated schizophrenia (HCC) Diagnosis: Principal Problem:   Undifferentiated schizophrenia (HCC)  Total Time spent with patient: 30 minutes  Past Psychiatric History: Past history of schizophrenia  Past Medical History:  Past Medical History:  Diagnosis Date   ADHD    Bipolar 1 disorder (HCC)    Hepatitis C    Schizoaffective disorder (HCC)    History reviewed. No pertinent surgical history. Family History:  Family History  Family history unknown: Yes   Family Psychiatric  History: See previous Social History:  Social History   Substance and Sexual Activity  Alcohol Use Not Currently     Social History   Substance and Sexual Activity  Drug Use Not Currently    Social History   Socioeconomic History   Marital status: Unknown    Spouse name: Not on file   Number of children: Not on file   Years of education: Not on file   Highest education level: Not on file  Occupational History   Not on file  Tobacco Use   Smoking status: Every Day    Packs/day: 0.25     Years: 15.00    Additional pack years: 0.00    Total pack years: 3.75    Types: Cigarettes   Smokeless tobacco: Not on file  Vaping Use   Vaping Use: Not on file  Substance and Sexual Activity   Alcohol use: Not Currently   Drug use: Not Currently   Sexual activity: Not Currently  Other Topics Concern   Not on file  Social History Narrative   Not on file   Social Determinants of Health   Financial Resource Strain: Not on file  Food Insecurity: Patient Declined (05/18/2022)   Hunger Vital Sign    Worried About Running Out of Food in the Last Year: Patient declined    Ran Out of Food in the Last Year: Patient declined  Transportation Needs: Patient Declined (05/18/2022)   PRAPARE - Administrator, Civil Service (Medical): Patient declined    Lack of Transportation (Non-Medical): Patient declined  Physical Activity: Not on file  Stress: Not on file  Social Connections: Not on file   Additional Social History:                         Sleep: Fair  Appetite:  Fair  Current Medications: Current Facility-Administered Medications  Medication Dose Route Frequency Provider Last Rate Last Admin   acetaminophen (TYLENOL) tablet 650 mg  650 mg Oral Q6H PRN Vanetta Mulders, NP  650 mg at 08/28/22 2109   alum & mag hydroxide-simeth (MAALOX/MYLANTA) 200-200-20 MG/5ML suspension 30 mL  30 mL Oral Q4H PRN Gabriel Cirri F, NP   30 mL at 09/01/22 2127   ARIPiprazole ER (ABILIFY MAINTENA) injection 400 mg  400 mg Intramuscular Q28 days Almeda Ezra T, MD   400 mg at 08/22/22 1615   cloZAPine (CLOZARIL) tablet 300 mg  300 mg Oral QHS Diani Jillson T, MD   300 mg at 09/02/22 2045   docusate sodium (COLACE) capsule 200 mg  200 mg Oral BID Riku Buttery T, MD   200 mg at 07/27/22 0939   famotidine (PEPCID) tablet 20 mg  20 mg Oral BID Caroll Cunnington T, MD   20 mg at 07/20/22 1740   guaiFENesin (ROBITUSSIN) 100 MG/5ML liquid 5 mL  5 mL Oral Q4H PRN Isham Smitherman T,  MD   5 mL at 08/22/22 2110   hydrOXYzine (ATARAX) tablet 50 mg  50 mg Oral Q6H PRN Xzavion Doswell T, MD   50 mg at 08/10/22 2143   magnesium citrate solution 1 Bottle  1 Bottle Oral Daily PRN Jaynie Bream, RPH   1 Bottle at 07/19/22 1643   magnesium hydroxide (MILK OF MAGNESIA) suspension 30 mL  30 mL Oral Daily PRN Jaynie Bream, RPH   30 mL at 08/27/22 2117   mineral oil liquid   Oral Daily PRN Sarina Ill, DO   Given at 07/17/22 1201   nicotine polacrilex (NICORETTE) gum 2 mg  2 mg Oral PRN Adnan Vanvoorhis, [name], MD   2 mg at 04/23/22 2200   polyethylene glycol (MIRALAX / GLYCOLAX) packet 17 g  17 g Oral Daily Enos Muhl, [name], MD   17 g at 09/01/22 1122   senna-docusate (Senokot-S) tablet 2 tablet  2 tablet Oral Daily PRN Courtez Twaddle, [name], MD   2 tablet at 07/14/22 0809   ziprasidone (GEODON) injection 20 mg  20 mg Intramuscular Q12H PRN Reggie Pile, MD        Lab Results: No results found for this or any previous visit (from the past 48 hour(s)).  Blood Alcohol level:  Lab Results  Component Value Date   ETH <10 04/15/2022    Metabolic Disorder Labs: Lab Results  Component Value Date   HGBA1C 5.5 04/24/2022   MPG 111 04/24/2022   No results found for: "PROLACTIN" Lab Results  Component Value Date   CHOL 220 (H) 04/24/2022   TRIG 700 (H) 04/24/2022   HDL 35 (L) 04/24/2022   CHOLHDL 6.3 04/24/2022   VLDL UNABLE TO CALCULATE IF TRIGLYCERIDE OVER 400 mg/dL 16/01/9603   LDLCALC UNABLE TO CALCULATE IF TRIGLYCERIDE OVER 400 mg/dL 54/12/8117    Physical Findings: AIMS: Facial and Oral Movements Muscles of Facial Expression: None, normal Lips and Perioral Area: None, normal Jaw: None, normal Tongue: None, normal,Extremity Movements Upper (arms, wrists, hands, fingers): None, normal Lower (legs, knees, ankles, toes): None, normal, Trunk Movements Neck, shoulders, hips: None, normal, Overall Severity Severity of abnormal movements (highest score from questions  above): None, normal Incapacitation due to abnormal movements: None, normal Patient's awareness of abnormal movements (rate only patient's report): No Awareness, Dental Status Current problems with teeth and/or dentures?: No Does patient usually wear dentures?: No  CIWA:    COWS:     Musculoskeletal: Strength & Muscle Tone: within normal limits Gait & Station: normal Patient leans: N/A  Psychiatric Specialty Exam:  Presentation  General Appearance:  Disheveled  Eye Contact: Minimal  Speech: Pressured  Speech Volume: Normal  Handedness: Right   Mood and Affect  Mood: Euphoric; Irritable  Affect: Inappropriate   Thought Process  Thought Processes: Disorganized  Descriptions of Associations:Loose  Orientation:Partial  Thought Content:Illogical; Scattered  History of Schizophrenia/Schizoaffective disorder:No data recorded Duration of Psychotic Symptoms:No data recorded Hallucinations:No data recorded Ideas of Reference:Delusions; Paranoia  Suicidal Thoughts:No data recorded Homicidal Thoughts:No data recorded  Sensorium  Memory: Immediate Poor; Recent Poor; Remote Poor  Judgment: Poor  Insight: Poor   Executive Functions  Concentration: Fair  Attention Span: Fair  Recall: Poor  Fund of Knowledge: Poor  Language: Poor   Psychomotor Activity  Psychomotor Activity:No data recorded  Assets  Assets: Desire for Improvement; Social Support   Sleep  Sleep:No data recorded   Physical Exam: Physical Exam Vitals and nursing note reviewed.  Constitutional:      Appearance: Normal appearance.  HENT:     Head: Normocephalic and atraumatic.     Mouth/Throat:     Pharynx: Oropharynx is clear.  Eyes:     Pupils: Pupils are equal, round, and reactive to light.  Cardiovascular:     Rate and Rhythm: Normal rate and regular rhythm.  Pulmonary:     Effort: Pulmonary effort is normal.     Breath sounds: Normal breath sounds.   Abdominal:     General: Abdomen is flat.     Palpations: Abdomen is soft.  Musculoskeletal:        General: Normal range of motion.  Skin:    General: Skin is warm and dry.  Neurological:     General: No focal deficit present.     Mental Status: He is alert. Mental status is at baseline.  Psychiatric:        Attention and Perception: Attention normal.        Mood and Affect: Mood normal. Affect is blunt.        Speech: Speech normal.        Behavior: Behavior is withdrawn.        Thought Content: Thought content is delusional.    Review of Systems  Constitutional: Negative.   HENT: Negative.    Eyes: Negative.   Respiratory: Negative.    Cardiovascular: Negative.   Gastrointestinal: Negative.   Musculoskeletal: Negative.   Skin: Negative.   Neurological: Negative.   Psychiatric/Behavioral: Negative.     Blood pressure 112/71, pulse 100, temperature 97.6 F (36.4 C), temperature source Oral, resp. rate 20, height 5\' 9"  (1.753 m), weight 96.4 kg, SpO2 98 %. Body mass index is 31.38 kg/m.   Treatment Plan Summary: Medication management and Plan patient is already on clozapine with an affective blood level.  Really not sure what else we could try that has not already been tried that is going to affect his psychosis and functioning.  I have to say that I really think what he needs most would be another long-term stay possibly indefinitely at a state hospital otherwise he is liable to continue to be on placeable while in the hospital but also to in evitable he get himself back into the hospital after discharge.  Encourage patient to think seriously about how just getting out of the hospital would be a step in the right direction.  In the meantime we continue to try and work altogether on finding some kind of disposition plan  Mordecai Rasmussen, MD 09/03/2022, 10:43 AM

## 2022-09-04 LAB — CBC WITH DIFFERENTIAL/PLATELET
Abs Immature Granulocytes: 0.01 10*3/uL (ref 0.00–0.07)
Basophils Absolute: 0.1 10*3/uL (ref 0.0–0.1)
Basophils Relative: 1 %
Eosinophils Absolute: 0.2 10*3/uL (ref 0.0–0.5)
Eosinophils Relative: 3 %
HCT: 43.9 % (ref 39.0–52.0)
Hemoglobin: 14.3 g/dL (ref 13.0–17.0)
Immature Granulocytes: 0 %
Lymphocytes Relative: 38 %
Lymphs Abs: 2.6 10*3/uL (ref 0.7–4.0)
MCH: 28 pg (ref 26.0–34.0)
MCHC: 32.6 g/dL (ref 30.0–36.0)
MCV: 85.9 fL (ref 80.0–100.0)
Monocytes Absolute: 0.5 10*3/uL (ref 0.1–1.0)
Monocytes Relative: 8 %
Neutro Abs: 3.4 10*3/uL (ref 1.7–7.7)
Neutrophils Relative %: 50 %
Platelets: 201 10*3/uL (ref 150–400)
RBC: 5.11 MIL/uL (ref 4.22–5.81)
RDW: 13.9 % (ref 11.5–15.5)
WBC: 6.8 10*3/uL (ref 4.0–10.5)
nRBC: 0 % (ref 0.0–0.2)

## 2022-09-04 NOTE — Group Note (Signed)
[facility] LCSW Group Therapy Note   Group Date: 09/04/2022 Start Time: 1300 End Time: 1400   Type of Therapy/Topic:  Group Therapy:  Emotion Regulation  Participation Level:  Did Not Attend   Mood:  Description of Group:    The purpose of this group is to assist patients in learning to regulate negative emotions and experience positive emotions. Patients will be guided to discuss ways in which they have been vulnerable to their negative emotions. These vulnerabilities will be juxtaposed with experiences of positive emotions or situations, and patients challenged to use positive emotions to combat negative ones. Special emphasis will be placed on coping with negative emotions in conflict situations, and patients will process healthy conflict resolution skills.  Therapeutic Goals: Patient will identify two positive emotions or experiences to reflect on in order to balance out negative emotions:  Patient will label two or more emotions that they find the most difficult to experience:  Patient will be able to demonstrate positive conflict resolution skills through discussion or role plays:   Summary of Patient Progress:   Patient did not attend group despite encouraged participation.      Therapeutic Modalities:   Cognitive Behavioral Therapy Feelings Identification Dialectical Behavioral Therapy   Kimbra Marcelino W Akeya Ryther, LCSWA 

## 2022-09-04 NOTE — Group Note (Signed)
Recreation Therapy Group Note   Group Topic:Relaxation  Group Date: 09/04/2022 Start Time: 1000 End Time: 1045 Facilitators: Rosina Lowenstein, LRT, CTRS Location:  [facility]  Group Description: PMR (Progressive Muscle Relaxation). LRT asks patients their current level of stress/anxiety from 1-10, with 10 being the highest. LRT educates patients on what PMR is and the benefits that come from it. Patients are asked to sit with their feet flat on the floor while sitting up and all the way back in their chair, if possible. LRT and pts follow a prompt through a speaker that requires you to tense and release different muscles in their body and focus on their breathing. During session, lights are off and soft music is being played. At the end of the prompt, LRT asks patients to rank their current levels of stress/anxiety from 1-10, 10 being the highest.   Goal Area(s) Addressed:  Patients will be able to describe progressive muscle relaxation.  Patient will practice using relaxation technique. Patient will identify a new coping skill.  Patient will follow multistep directions to reduce anxiety and stress.  Affect/Mood: N/A   Participation Level: Did not attend    Clinical Observations/Individualized Feedback: [place] did not attend group due to resting in his room.  Plan: Continue to engage patient in RT group sessions 2-3x/week.   Rosina Lowenstein, LRT, CTRS 09/04/2022 10:53 AM

## 2022-09-04 NOTE — Plan of Care (Signed)
Problem: Education: Goal: Knowledge of General Education information will improve Description: Including pain rating scale, medication(s)/side effects and non-pharmacologic comfort measures 09/04/2022 1219 by Delos Haring, RN Outcome: Progressing 09/04/2022 1219 by Delos Haring, RN Outcome: Progressing   Problem: Health Behavior/Discharge Planning: Goal: Ability to manage health-related needs will improve 09/04/2022 1219 by Delos Haring, RN Outcome: Progressing 09/04/2022 1219 by Delos Haring, RN Outcome: Progressing   Problem: Clinical Measurements: Goal: Ability to maintain clinical measurements within normal limits will improve 09/04/2022 1219 by Delos Haring, RN Outcome: Progressing 09/04/2022 1219 by Delos Haring, RN Outcome: Progressing Goal: Will remain free from infection 09/04/2022 1219 by Delos Haring, RN Outcome: Progressing 09/04/2022 1219 by Delos Haring, RN Outcome: Progressing Goal: Diagnostic test results will improve 09/04/2022 1219 by Delos Haring, RN Outcome: Progressing 09/04/2022 1219 by Delos Haring, RN Outcome: Progressing Goal: Respiratory complications will improve 09/04/2022 1219 by Delos Haring, RN Outcome: Progressing 09/04/2022 1219 by Delos Haring, RN Outcome: Progressing Goal: Cardiovascular complication will be avoided 09/04/2022 1219 by Delos Haring, RN Outcome: Progressing 09/04/2022 1219 by Delos Haring, RN Outcome: Progressing   Problem: Activity: Goal: Risk for activity intolerance will decrease 09/04/2022 1219 by Delos Haring, RN Outcome: Progressing 09/04/2022 1219 by Delos Haring, RN Outcome: Progressing   Problem: Nutrition: Goal: Adequate nutrition will be maintained 09/04/2022 1219 by Delos Haring, RN Outcome: Progressing 09/04/2022 1219 by Delos Haring, RN Outcome: Progressing   Problem: Coping: Goal: Level of anxiety will decrease 09/04/2022 1219 by Delos Haring, RN Outcome:  Progressing 09/04/2022 1219 by Delos Haring, RN Outcome: Progressing   Problem: Elimination: Goal: Will not experience complications related to urinary retention 09/04/2022 1219 by Delos Haring, RN Outcome: Progressing 09/04/2022 1219 by Delos Haring, RN Outcome: Progressing   Problem: Pain Managment: Goal: General experience of comfort will improve 09/04/2022 1219 by Delos Haring, RN Outcome: Progressing 09/04/2022 1219 by Delos Haring, RN Outcome: Progressing   Problem: Safety: Goal: Ability to remain free from injury will improve 09/04/2022 1219 by Delos Haring, RN Outcome: Progressing 09/04/2022 1219 by Delos Haring, RN Outcome: Progressing   Problem: Skin Integrity: Goal: Risk for impaired skin integrity will decrease 09/04/2022 1219 by Delos Haring, RN Outcome: Progressing 09/04/2022 1219 by Delos Haring, RN Outcome: Progressing   Problem: Education: Goal: Knowledge of [facility] Education information/materials will improve 09/04/2022 1219 by Delos Haring, RN Outcome: Progressing 09/04/2022 1219 by Delos Haring, RN Outcome: Progressing Goal: Emotional status will improve 09/04/2022 1219 by Delos Haring, RN Outcome: Progressing 09/04/2022 1219 by Delos Haring, RN Outcome: Progressing Goal: Mental status will improve 09/04/2022 1219 by Delos Haring, RN Outcome: Progressing 09/04/2022 1219 by Delos Haring, RN Outcome: Progressing Goal: Verbalization of understanding the information provided will improve 09/04/2022 1219 by Delos Haring, RN Outcome: Progressing 09/04/2022 1219 by Delos Haring, RN Outcome: Progressing   Problem: Activity: Goal: Interest or engagement in activities will improve 09/04/2022 1219 by Delos Haring, RN Outcome: Progressing 09/04/2022 1219 by Delos Haring, RN Outcome: Progressing Goal: Sleeping patterns will improve 09/04/2022 1219 by Delos Haring, RN Outcome:  Progressing 09/04/2022 1219 by Delos Haring, RN Outcome: Progressing   Problem: Coping: Goal: Ability to verbalize frustrations and anger appropriately will improve 09/04/2022 1219 by Delos Haring, RN Outcome: Progressing 09/04/2022 1219 by Delos Haring, RN Outcome: Progressing  Goal: Ability to demonstrate self-control will improve 09/04/2022 1219 by Delos Haring, RN Outcome: Progressing 09/04/2022 1219 by Delos Haring, RN Outcome: Progressing   Problem: Health Behavior/Discharge Planning: Goal: Identification of resources available to assist in meeting health care needs will improve 09/04/2022 1219 by Delos Haring, RN Outcome: Progressing 09/04/2022 1219 by Delos Haring, RN Outcome: Progressing Goal: Compliance with treatment plan for underlying cause of condition will improve 09/04/2022 1219 by Delos Haring, RN Outcome: Progressing 09/04/2022 1219 by Delos Haring, RN Outcome: Progressing   Problem: Physical Regulation: Goal: Ability to maintain clinical measurements within normal limits will improve 09/04/2022 1219 by Delos Haring, RN Outcome: Progressing 09/04/2022 1219 by Delos Haring, RN Outcome: Progressing   Problem: Safety: Goal: Periods of time without injury will increase 09/04/2022 1219 by Delos Haring, RN Outcome: Progressing 09/04/2022 1219 by Delos Haring, RN Outcome: Progressing   Problem: Activity: Goal: Will verbalize the importance of balancing activity with adequate rest periods 09/04/2022 1219 by Delos Haring, RN Outcome: Progressing 09/04/2022 1219 by Delos Haring, RN Outcome: Progressing   Problem: Education: Goal: Will be free of psychotic symptoms 09/04/2022 1219 by Delos Haring, RN Outcome: Progressing 09/04/2022 1219 by Delos Haring, RN Outcome: Progressing Goal: Knowledge of the prescribed therapeutic regimen will improve 09/04/2022 1219 by Delos Haring, RN Outcome: Progressing 09/04/2022 1219 by  Delos Haring, RN Outcome: Progressing   Problem: Coping: Goal: Coping ability will improve 09/04/2022 1219 by Delos Haring, RN Outcome: Progressing 09/04/2022 1219 by Delos Haring, RN Outcome: Progressing Goal: Will verbalize feelings 09/04/2022 1219 by Delos Haring, RN Outcome: Progressing 09/04/2022 1219 by Delos Haring, RN Outcome: Progressing   Problem: Health Behavior/Discharge Planning: Goal: Compliance with prescribed medication regimen will improve 09/04/2022 1219 by Delos Haring, RN Outcome: Progressing 09/04/2022 1219 by Delos Haring, RN Outcome: Progressing   Problem: Nutritional: Goal: Ability to achieve adequate nutritional intake will improve 09/04/2022 1219 by Delos Haring, RN Outcome: Progressing 09/04/2022 1219 by Delos Haring, RN Outcome: Progressing   Problem: Role Relationship: Goal: Ability to communicate needs accurately will improve 09/04/2022 1219 by Delos Haring, RN Outcome: Progressing 09/04/2022 1219 by Delos Haring, RN Outcome: Progressing Goal: Ability to interact with others will improve 09/04/2022 1219 by Delos Haring, RN Outcome: Progressing 09/04/2022 1219 by Delos Haring, RN Outcome: Progressing   Problem: Safety: Goal: Ability to redirect hostility and anger into socially appropriate behaviors will improve 09/04/2022 1219 by Delos Haring, RN Outcome: Progressing 09/04/2022 1219 by Delos Haring, RN Outcome: Progressing Goal: Ability to remain free from injury will improve 09/04/2022 1219 by Delos Haring, RN Outcome: Progressing 09/04/2022 1219 by Delos Haring, RN Outcome: Progressing   Problem: Self-Care: Goal: Ability to participate in self-care as condition permits will improve 09/04/2022 1219 by Delos Haring, RN Outcome: Progressing 09/04/2022 1219 by Delos Haring, RN Outcome: Progressing   Problem: Self-Concept: Goal: Will verbalize positive feelings about self 09/04/2022 1219 by  Delos Haring, RN Outcome: Progressing 09/04/2022 1219 by Delos Haring, RN Outcome: Progressing

## 2022-09-04 NOTE — Group Note (Signed)
Date:  09/04/2022 Time:  12:36 AM  Group Topic/Focus:  Wrap-Up Group:   The focus of this group is to help patients review their daily goal of treatment and discuss progress on daily workbooks.    Participation Level:  Minimal  Participation Quality:  Attentive  Affect:  Flat  Cognitive:  Alert and Appropriate  Insight: Appropriate and Lacking  Engagement in Group:  Improving  Modes of Intervention:  Discussion  Additional Comments:     Sandoval,Travis Mooradian E 09/04/2022, 12:36 AM

## 2022-09-04 NOTE — Progress Notes (Signed)
[name] Progress Note  09/04/2022 1:32 PM Travis Sandoval  MRN:  [id number] Subjective: Follow-up for this 42 year old man with schizophrenia.  No behavior problems.  Still stays isolated to his room.  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 Principal Problem: Undifferentiated schizophrenia (HCC) Diagnosis: Principal Problem:   Undifferentiated schizophrenia (HCC)  Total Time spent with patient: 30 minutes  Past Psychiatric History: Past history of longstanding schizophrenia  Past Medical History:  Past Medical History:  Diagnosis Date   ADHD    Bipolar 1 disorder (HCC)    Hepatitis C    Schizoaffective disorder (HCC)    History reviewed. No pertinent surgical history. Family History:  Family History  Family history unknown: Yes   Family Psychiatric  History: See previous Social History:  Social History   Substance and Sexual Activity  Alcohol Use Not Currently     Social History   Substance and Sexual Activity  Drug Use Not Currently    Social History   Socioeconomic History   Marital status: Unknown    Spouse name: Not on file   Number of children: Not on file   Years of education: Not on file   Highest education level: Not on file  Occupational History   Not on file  Tobacco Use   Smoking status: Every Day    Packs/day: 0.25    Years: 15.00    Additional pack years: 0.00    Total pack years: 3.75    Types: Cigarettes   Smokeless tobacco: Not on file  Vaping Use   Vaping Use: Not on file  Substance and Sexual Activity   Alcohol use: Not Currently   Drug use: Not Currently   Sexual activity: Not Currently  Other Topics Concern   Not on file  Social History Narrative   Not on file   Social Determinants of Health   Financial Resource Strain: Not on file  Food Insecurity:  Patient Declined (05/18/2022)   Hunger Vital Sign    Worried About Running Out of Food in the Last Year: Patient declined    Ran Out of Food in the Last Year: Patient declined  Transportation Needs: Patient Declined (05/18/2022)   PRAPARE - Administrator, Civil Service (Medical): Patient declined    Lack of Transportation (Non-Medical): Patient declined  Physical Activity: Not on file  Stress: Not on file  Social Connections: Not on file   Additional Social History:                         Sleep: Fair  Appetite:  Fair  Current Medications: Current Facility-Administered Medications  Medication Dose Route Frequency Provider Last Rate Last Admin   acetaminophen (TYLENOL) tablet 650 mg  650 mg Oral Q6H PRN Gabriel Cirri F, NP   650 mg at 08/28/22 2109   alum & mag hydroxide-simeth (MAALOX/MYLANTA) 200-200-20 MG/5ML suspension 30 mL  30 mL Oral Q4H PRN Gabriel Cirri F, NP   30 mL at 09/01/22 2127   ARIPiprazole ER (ABILIFY MAINTENA) injection 400 mg  400 mg Intramuscular Q28 days Eniyah Eastmond T, MD   400 mg at 08/22/22 1615   cloZAPine (CLOZARIL) tablet 300 mg  300 mg Oral QHS Cecylia Brazill, [name], MD   300 mg at  09/03/22 2243   docusate sodium (COLACE) capsule 200 mg  200 mg Oral BID Debrina Kizer T, MD   200 mg at 07/27/22 0939   famotidine (PEPCID) tablet 20 mg  20 mg Oral BID Journey Castonguay, [name], MD   20 mg at 07/20/22 1740   guaiFENesin (ROBITUSSIN) 100 MG/5ML liquid 5 mL  5 mL Oral Q4H PRN Vicent Febles, [name], MD   5 mL at 08/22/22 2110   hydrOXYzine (ATARAX) tablet 50 mg  50 mg Oral Q6H PRN Zalan Shidler, [name], MD   50 mg at 08/10/22 2143   magnesium citrate solution 1 Bottle  1 Bottle Oral Daily PRN Jaynie Bream, RPH   1 Bottle at 07/19/22 1643   magnesium hydroxide (MILK OF MAGNESIA) suspension 30 mL  30 mL Oral Daily PRN Jaynie Bream, RPH   30 mL at 08/27/22 2117   mineral oil liquid   Oral Daily PRN Sarina Ill, DO   Given at 07/17/22 1201    nicotine polacrilex (NICORETTE) gum 2 mg  2 mg Oral PRN Baylynn Shifflett, [name], MD   2 mg at 04/23/22 2200   polyethylene glycol (MIRALAX / GLYCOLAX) packet 17 g  17 g Oral Daily Aleczander Fandino, [name], MD   17 g at 09/04/22 1008   senna-docusate (Senokot-S) tablet 2 tablet  2 tablet Oral Daily PRN Gaberial Cada, [name], MD   2 tablet at 07/14/22 0809   ziprasidone (GEODON) injection 20 mg  20 mg Intramuscular Q12H PRN Reggie Pile, MD        Lab Results:  Results for orders placed or performed during the hospital encounter of 04/16/22 (from the past 48 hour(s))  CBC with Differential/Platelet     Status: None   Collection Time: 09/04/22 10:11 AM  Result Value Ref Range   WBC 6.8 4.0 - 10.5 K/uL   RBC 5.11 4.22 - 5.81 MIL/uL   Hemoglobin 14.3 13.0 - 17.0 g/dL   HCT 96.0 45.4 - 09.8 %   MCV 85.9 80.0 - 100.0 fL   MCH 28.0 26.0 - 34.0 pg   MCHC 32.6 30.0 - 36.0 g/dL   RDW 11.9 14.7 - 82.9 %   Platelets 201 150 - 400 K/uL   nRBC 0.0 0.0 - 0.2 %   Neutrophils Relative % 50 %   Neutro Abs 3.4 1.7 - 7.7 K/uL   Lymphocytes Relative 38 %   Lymphs Abs 2.6 0.7 - 4.0 K/uL   Monocytes Relative 8 %   Monocytes Absolute 0.5 0.1 - 1.0 K/uL   Eosinophils Relative 3 %   Eosinophils Absolute 0.2 0.0 - 0.5 K/uL   Basophils Relative 1 %   Basophils Absolute 0.1 0.0 - 0.1 K/uL   Immature Granulocytes 0 %   Abs Immature Granulocytes 0.01 0.00 - 0.07 K/uL    Comment: Performed at [facility], [address] [place], [place] [address]    Blood Alcohol level:  Lab Results  Component Value Date   [facility] <10 04/15/2022    Metabolic Disorder Labs: Lab Results  Component Value Date   HGBA1C 5.5 04/24/2022   MPG 111 04/24/2022   No results found for: "PROLACTIN" Lab Results  Component Value Date   CHOL 220 (H) 04/24/2022   TRIG 700 (H) 04/24/2022   HDL 35 (L) 04/24/2022   CHOLHDL 6.3 04/24/2022   VLDL UNABLE TO CALCULATE IF TRIGLYCERIDE OVER 400 mg/dL 08/65/7846   LDLCALC UNABLE TO CALCULATE IF  TRIGLYCERIDE OVER 400 mg/dL 96/29/5284  Physical Findings: AIMS: Facial and Oral Movements Muscles of Facial Expression: None, normal Lips and Perioral Area: None, normal Jaw: None, normal Tongue: None, normal,Extremity Movements Upper (arms, wrists, hands, fingers): None, normal Lower (legs, knees, ankles, toes): None, normal, Trunk Movements Neck, shoulders, hips: None, normal, Overall Severity Severity of abnormal movements (highest score from questions above): None, normal Incapacitation due to abnormal movements: None, normal Patient's awareness of abnormal movements (rate only patient's report): No Awareness, Dental Status Current problems with teeth and/or dentures?: No Does patient usually wear dentures?: No  CIWA:    COWS:     Musculoskeletal: Strength & Muscle Tone: within normal limits Gait & Station: normal Patient leans: N/A  Psychiatric Specialty Exam:  Presentation  General Appearance:  Disheveled  Eye Contact: Minimal  Speech: Pressured  Speech Volume: Normal  Handedness: Right   Mood and Affect  Mood: Euphoric; Irritable  Affect: Inappropriate   Thought Process  Thought Processes: Disorganized  Descriptions of Associations:Loose  Orientation:Partial  Thought Content:Illogical; Scattered  History of Schizophrenia/Schizoaffective disorder:No data recorded Duration of Psychotic Symptoms:No data recorded Hallucinations:No data recorded Ideas of Reference:Delusions; Paranoia  Suicidal Thoughts:No data recorded Homicidal Thoughts:No data recorded  Sensorium  Memory: Immediate Poor; Recent Poor; Remote Poor  Judgment: Poor  Insight: Poor   Executive Functions  Concentration: Fair  Attention Span: Fair  Recall: Poor  Fund of Knowledge: Poor  Language: Poor   Psychomotor Activity  Psychomotor Activity:No data recorded  Assets  Assets: Desire for Improvement; Social Support   Sleep  Sleep:No data  recorded   Physical Exam: Physical Exam Vitals and nursing note reviewed.  Constitutional:      Appearance: Normal appearance.  HENT:     Head: Normocephalic and atraumatic.     Mouth/Throat:     Pharynx: Oropharynx is clear.  Eyes:     Pupils: Pupils are equal, round, and reactive to light.  Cardiovascular:     Rate and Rhythm: Normal rate and regular rhythm.  Pulmonary:     Effort: Pulmonary effort is normal.     Breath sounds: Normal breath sounds.  Abdominal:     General: Abdomen is flat.     Palpations: Abdomen is soft.  Musculoskeletal:        General: Normal range of motion.  Skin:    General: Skin is warm and dry.  Neurological:     General: No focal deficit present.     Mental Status: He is alert. Mental status is at baseline.  Psychiatric:        Attention and Perception: Attention normal.        Mood and Affect: Mood normal.        Speech: Speech normal.        Behavior: Behavior is withdrawn.        Thought Content: Thought content is delusional.        Cognition and Memory: Cognition normal.    Review of Systems  Constitutional: Negative.   HENT: Negative.    Eyes: Negative.   Respiratory: Negative.    Cardiovascular: Negative.   Gastrointestinal: Negative.   Musculoskeletal: Negative.   Skin: Negative.   Neurological: Negative.   Psychiatric/Behavioral: Negative.     Blood pressure 107/81, pulse 89, temperature 97.7 F (36.5 C), temperature source Oral, resp. rate 18, height 5\' 9"  (1.753 m), weight 96.4 kg, SpO2 97 %. Body mass index is 31.38 kg/m.   Treatment Plan Summary: Plan patient with schizophrenia.  He is already on full-dose Clozapine plus  a long-acting injectable of Abilify.  I am not sure what else we can do that is likely to help with his persistent delusions.  At the rate we are going he is unlikely to ever agree to discharge.  He seems perfectly content to stay here indefinitely.  Treatment team and outside community representatives  are still working on trying to find options.  Mordecai Rasmussen, MD 09/04/2022, 1:32 PM

## 2022-09-04 NOTE — Progress Notes (Signed)
Unremarkable shift. Denies SI, HI, AVH. Isolative to self and room. Medication compliant. Prn given for indigestion with good relief.   Encouragement and support provided. Safety checks maintained. Medications given as prescribed. Pt receptive and remains safe on unit with q 15 min checks.

## 2022-09-04 NOTE — BH IP Treatment Plan (Signed)
Interdisciplinary Treatment and Diagnostic Plan Update  09/04/2022 Time of Session: 08:30 Travis Sandoval MRN: [id number]  Principal Diagnosis: Undifferentiated schizophrenia [facility])  Secondary Diagnoses: Principal Problem:   Undifferentiated schizophrenia (HCC)   Current Medications:  Current Facility-Administered Medications  Medication Dose Route Frequency Provider Last Rate Last Admin   acetaminophen (TYLENOL) tablet 650 mg  650 mg Oral Q6H PRN Gabriel Cirri F, NP   650 mg at 08/28/22 2109   alum & mag hydroxide-simeth (MAALOX/MYLANTA) 200-200-20 MG/5ML suspension 30 mL  30 mL Oral Q4H PRN Gabriel Cirri F, NP   30 mL at 09/01/22 2127   ARIPiprazole ER (ABILIFY MAINTENA) injection 400 mg  400 mg Intramuscular Q28 days [name], [name], MD   400 mg at 08/22/22 1615   cloZAPine (CLOZARIL) tablet 300 mg  300 mg Oral QHS [name], [name], MD   300 mg at 09/03/22 2243   docusate sodium (COLACE) capsule 200 mg  200 mg Oral BID [name], [name], MD   200 mg at 07/27/22 0939   famotidine (PEPCID) tablet 20 mg  20 mg Oral BID [name], [name], MD   20 mg at 07/20/22 1740   guaiFENesin (ROBITUSSIN) 100 MG/5ML liquid 5 mL  5 mL Oral Q4H PRN [name], [name], MD   5 mL at 08/22/22 2110   hydrOXYzine (ATARAX) tablet 50 mg  50 mg Oral Q6H PRN [name], [name], MD   50 mg at 08/10/22 2143   magnesium citrate solution 1 Bottle  1 Bottle Oral Daily PRN Jaynie Bream, RPH   1 Bottle at 07/19/22 1643   magnesium hydroxide (MILK OF MAGNESIA) suspension 30 mL  30 mL Oral Daily PRN Jaynie Bream, RPH   30 mL at 08/27/22 2117   mineral oil liquid   Oral Daily PRN Sarina Ill, DO   Given at 07/17/22 1201   nicotine polacrilex (NICORETTE) gum 2 mg  2 mg Oral PRN [name], [name], MD   2 mg at 04/23/22 2200   polyethylene glycol (MIRALAX / GLYCOLAX) packet 17 g  17 g Oral Daily [name], [name], MD   17 g at 09/04/22 1008   senna-docusate (Senokot-S) tablet 2 tablet  2 tablet Oral Daily PRN  [name], [name], MD   2 tablet at 07/14/22 0809   ziprasidone (GEODON) injection 20 mg  20 mg Intramuscular Q12H PRN Reggie Pile, MD       PTA Medications: Medications Prior to Admission  Medication Sig Dispense Refill Last Dose   ABILIFY MAINTENA 400 MG SRER injection Inject 400 mg into the muscle every 28 (twenty-eight) days.      ARIPiprazole (ABILIFY) 10 MG tablet Take 10 mg by mouth daily. (Patient not taking: Reported on 04/16/2022)      atomoxetine (STRATTERA) 40 MG capsule Take 40 mg by mouth daily. (Patient not taking: Reported on 04/16/2022)      atorvastatin (LIPITOR) 40 MG tablet Take 40 mg by mouth daily.      budesonide-formoterol (SYMBICORT) 160-4.5 MCG/ACT inhaler Inhale 2 puffs into the lungs.      cetirizine (ZYRTEC) 10 MG tablet Take 10 mg by mouth daily.      clozapine (CLOZARIL) 200 MG tablet Take 200 mg by mouth 2 (two) times daily. Take along with one 25 mg tablet for total 225 mg twice daily      cloZAPine (CLOZARIL) 25 MG tablet Take 25 mg by mouth 2 (two) times daily. Take along with one 200 mg tablet for total  225 mg twice daily      Dexlansoprazole (DEXILANT) 30 MG capsule Take 30 mg by mouth daily. (Patient not taking: Reported on 04/16/2022)      hydrOXYzine (VISTARIL) 50 MG capsule Take 50 mg by mouth 3 (three) times daily as needed for itching. (Patient not taking: Reported on 04/16/2022)      Melatonin 5 MG TABS Take 5 mg by mouth at bedtime as needed (sleep).      metoprolol succinate (TOPROL-XL) 25 MG 24 hr tablet Take 12.5 mg by mouth daily.      Phosphatidyl Choline 65 MG TABS Take 1 tablet by mouth daily.  (Patient not taking: Reported on 04/16/2022)      valproic acid (DEPAKENE) 250 MG/5ML solution Take 750-1,000 mLs by mouth 2 (two) times daily. 1000 mg (20 mL) every morning and 750 mg (15 mL) daily at bedtime       Patient Stressors: Other: Psychosis    Patient Strengths: Ability for insight  Active sense of humor  Average or above average  intelligence  Capable of independent living  Supportive family/friends   Treatment Modalities: Medication Management, Group therapy, Case management,  1 to 1 session with clinician, Psychoeducation, Recreational therapy.   Physician Treatment Plan for Primary Diagnosis: Undifferentiated schizophrenia (HCC) Long Term Goal(s):     Short Term Goals: None, pt is a poor hx.  Medication Management: Evaluate patient's response, side effects, and tolerance of medication regimen.  Therapeutic Interventions: 1 to 1 sessions, Unit Group sessions and Medication administration.  Evaluation of Outcomes: Adequate for Discharge  Physician Treatment Plan for Secondary Diagnosis: Principal Problem:   Undifferentiated schizophrenia (HCC)  Long Term Goal(s):     Short Term Goals: None, pt is a poor hx.     Medication Management: Evaluate patient's response, side effects, and tolerance of medication regimen.  Therapeutic Interventions: 1 to 1 sessions, Unit Group sessions and Medication administration.  Evaluation of Outcomes: Adequate for Discharge   RN Treatment Plan for Primary Diagnosis: Undifferentiated schizophrenia (HCC) Long Term Goal(s): Knowledge of disease and therapeutic regimen to maintain health will improve  Short Term Goals: Ability to remain free from injury will improve, Ability to verbalize frustration and anger appropriately will improve, Ability to demonstrate self-control, Ability to participate in decision making will improve, Ability to verbalize feelings will improve, Ability to disclose and discuss suicidal ideas, Ability to identify and develop effective coping behaviors will improve, and Compliance with prescribed medications will improve  Medication Management: RN will administer medications as ordered by provider, will assess and evaluate patient's response and provide education to patient for prescribed medication. RN will report any adverse and/or side effects to  prescribing provider.  Therapeutic Interventions: 1 on 1 counseling sessions, Psychoeducation, Medication administration, Evaluate responses to treatment, Monitor vital signs and CBGs as ordered, Perform/monitor CIWA, COWS, AIMS and Fall Risk screenings as ordered, Perform wound care treatments as ordered.  Evaluation of Outcomes: Adequate for Discharge   LCSW Treatment Plan for Primary Diagnosis: Undifferentiated schizophrenia (HCC) Long Term Goal(s): Safe transition to appropriate next level of care at discharge, Engage patient in therapeutic group addressing interpersonal concerns.  Short Term Goals: Engage patient in aftercare planning with referrals and resources, Increase social support, Increase ability to appropriately verbalize feelings, Increase emotional regulation, Facilitate acceptance of mental health diagnosis and concerns, and Increase skills for wellness and recovery  Therapeutic Interventions: Assess for all discharge needs, 1 to 1 time with Social worker, Explore available resources and support systems, Assess for  adequacy in community support network, Educate family and significant other(s) on suicide prevention, Complete Psychosocial Assessment, Interpersonal group therapy.  Evaluation of Outcomes: Adequate for Discharge   Progress in Treatment: Attending groups: No. Participating in groups: No. Taking medication as prescribed: Yes. Toleration medication: Yes. Family/Significant other contact made: Yes, individual(s) contacted:  father, Percy Gersh. Patient understands diagnosis: No. Discussing patient identified problems/goals with staff: Yes. Medical problems stabilized or resolved: Yes. Denies suicidal/homicidal ideation: Yes. Issues/concerns per patient self-inventory: No. Other: none.  New problem(s) identified: No, Describe:  none Update 06/30/2021:  No changes at this time. Update 07/06/2022:  No changes at this time. Update 07/11/22: No changes at this time.  Update 07/16/22: none. Update 07/21/22: none. Update 07/26/22: No changes at this time.  Update 07/31/2022:  No changes at this time.  Update 08/05/2022: No change at this time. Update 08/10/22: No changes at this time. Update 08/15/22: No changes at this time. Update 08/20/22: none. Update 08/25/22: none. Update 08/30/22: No changes at this time. Update 09/04/22: No changes at this time.   New Short Term/Long Term Goal(s): elimination of symptoms of psychosis, medication management for mood stabilization; elimination of SI thoughts; development of comprehensive mental wellness plan. Update 06/30/2021:  No changes at this time. Update 07/06/2022:  No changes at this time. Update 07/11/22: No changes at this time. Update 07/16/22: Patient to work towards elimination of symptoms of psychosis, medication management for mood stabilization; development of comprehensive mental wellness plan. Update 07/21/22: Patient to work towards elimination of symptoms of psychosis, medication management for mood stabilization; development of comprehensive mental wellness plan. Update 07/26/22: No changes at this time.  Update 07/31/2022:  No changes at this time.  Update 08/05/2022: No change at this time. Update 08/10/22: No changes at this time. Update 08/15/22: No changes at this time. Update 08/20/22:  Patient to work towards elimination of symptoms of psychosis, medication management for mood stabilization; development of comprehensive mental wellness plan. Update 08/25/22: no additional goals identified at this time. Update 08/30/22: No changes at this time. Update 09/04/22: No changes at this time.   Patient Goals:  No additional treatment goals identified by patient. Update 06/30/2021:  No changes at this time. Update 07/06/2022:  No changes at this time. Update 07/11/22: No changes at this time. Update 07/16/22: No additional goals identified at this time. Patient to continue to work towards original goals identified in initial treatment team meeting.  CSW will remain available to patient should they voice additional treatment goals. Update 07/21/22: No additional goals identified at this time. Patient to continue to work towards original goals identified in initial treatment team meeting. CSW will remain available to patient should they voice additional treatment goals. Update 07/26/22: No changes at this time.  Update 07/31/2022:  No changes at this time. Update 08/05/2022: No change at this time. Update 08/10/22: No changes at this time. Update 08/15/22: No changes at this time. Update 08/20/22: No additional goals identified at this time. Patient to continue to work towards original goals identified in initial treatment team meeting. CSW will remain available to patient should they voice additional treatment goals. Update 08/25/22: no additional goals identified at this time. Update 08/30/22: No changes at this time. Update 09/04/22: No changes at this time.   Discharge Plan or Barriers: Patient remains on the unit as placement continues to be sought. Funding remains the concern at this time and biggest barrier to placement at this time. Update 06/30/2021:  No changes at this time.  Update 07/06/2022:  No changes at this time. Update 07/11/22: No changes at this time. Update 07/16/22: Patient lacks funding for placement, [employer] rep continues to communicate with social security to restart payment. Patient has guardianship hearing on this day. Situation ongoing, CSW will continue to monitor and update note as more information becomes available. Update 07/21/22: Patient lacks adequate funding, CSW to continue pursing payor source to secure placement. Patient is unable to living independently per psychiatrist. Update 07/26/22: No changes at this time. Update 07/31/2022:  No changes at this time.  Update 08/05/2022: No change at this time. Update 08/10/22: No changes at this time. Update 08/15/22: No changes at this time. Update 08/20/22: Patient lack funding to procure group home  placement. Per note on 4/29, "CSW reached [facility] DSS regarding update on payee application. [name], DSS Guardian Supervisor, at [phone] ext. [phone] reports the patient has been awarded [employer], although the social security office has not yet awarded an amount. CSW clarified if DSS has been appointed as the patient's payee at this time. [name] reports they have not been appointed as the payee yet. CSW encouraged DSS to apply or have someone appointed payee as soon as possible in order to avoid any delays once social security has determined an amount." Update 08/25/22: Patient is waiting on insurance. Update 08/30/22: Pt has an interview for potential placement option today at 10:30. CSW to assist with virtual interview. Update 09/04/22: Placement search continues. Pt participated in an interview yesterday and team is awaiting determination. Previous interview which had been arranged for 08/30/22 fell through as facility staff member did not enter the call.    Reason for Continuation of Hospitalization: Pt is boarding.   Estimated Length of Stay: TBD Update 07/06/2022:  TBD Update 07/11/22: No changes at this time. Update 07/16/22: TBD. Update 07/21/22: TBD. Update 07/26/22: No changes at this time.  Update 07/31/2022:  No changes at this time.  Update 08/05/2022: No change at this time. Update 08/10/22: No changes at this time. Update 08/15/22: No changes at this time. Update 08/20/22: TBD. Update 08/25/22: TBD. Update 08/30/22: No changes at this time. Update 09/04/22: No changes at this time.  Last 3 [place] Suicide Severity Risk Score: Flowsheet Row Admission (Current) from 04/16/2022 in [facility] ED from 04/15/2022 in [facility] Emergency Department at [facility]  C-SSRS RISK CATEGORY No Risk No Risk       Last PHQ 2/9 Scores:     No data to display          Scribe for Treatment Team: Glenis Smoker, LCSW 09/04/2022 10:36 AM

## 2022-09-04 NOTE — Group Note (Signed)
Date:  09/04/2022 Time:  6:14 PM  Group Topic/Focus:  Outdoor Recreation    Participation Level:  Active  Participation Quality:  Appropriate  Affect:  Appropriate  Cognitive:  Appropriate  Insight: Appropriate  Engagement in Group:  Engaged  Modes of Intervention:  Activity  Additional Comments:    Wilford Corner 09/04/2022, 6:14 PM

## 2022-09-04 NOTE — Consult Note (Signed)
Pharmacy Consult - Clozapine     42 yo male  This patient's order has been reviewed for prescribing contraindications.    Clozapine [facility] enrollment Verified: yes on 04/17/22 [facility] patient ID: [id number] Current Outpatient Monitoring: Every 2 weeks   Home Regimen:  225 mg po BID Last dose: unknown   Dose Adjustments This Admission: Dose started at clozapine 100 mg po daily Dose is currently clozapine 350 mg po HS (started 3/12) > 300mg  qHS 08/04/2022   Labs: Date    ANC    Submitted? 12/27 2800 Yes 01/03 3500 Yes 01/10   3100 Yes 01/24 3000 Yes 01/31 4400 yes   02/07 4300 yes 02/14 4500 Yes 02/21 5500 Yes 02/28 4080 Yes 03/06 4300 Yes  03/13 4100 Yes 03/20 4800 Yes 03/27 4800 Yes 04/03 4200 Yes 04/10 5400 Yes 04/17 3570 Yes 04/24 4000 Yes 05/01 4200 0508 2900 Yes 05/15 3400  Yes  Plan: Continue clozapine 300 mg po HS Monitor ANC at least weekly while inpatient. Next CBC with differential on 09/11/22.   Lowella Bandy, PharmD Clinical Pharmacist   09/04/2022 10:51 AM

## 2022-09-04 NOTE — Plan of Care (Signed)
  Problem: Education: Goal: Knowledge of General Education information will improve Description: Including pain rating scale, medication(s)/side effects and non-pharmacologic comfort measures Outcome: Progressing   Problem: Activity: Goal: Risk for activity intolerance will decrease Outcome: Progressing   Problem: Coping: Goal: Level of anxiety will decrease Outcome: Progressing   Problem: Safety: Goal: Ability to remain free from injury will improve Outcome: Progressing   Problem: Education: Goal: Emotional status will improve Outcome: Progressing Goal: Mental status will improve Outcome: Progressing

## 2022-09-04 NOTE — Progress Notes (Signed)
D- Patient alert and oriented x 3-4. Affect flat/mood preoccupied. Denies SI/ HI/ AVH. Patient denies pain. He denies depression and anxiety. He is seen responding to internal stimuli when in his room, talking to himself. A- Scheduled medications administered to patient, per MD orders except for Colace and Pepcid. He states it makes him "constipated". Support and encouragement provided.  Routine safety checks conducted every 15 minutes without incident. Patient informed to notify staff with problems or concerns and verbalizes understanding. R- No adverse drug reactions noted.  Patient compliant treatment plan. Patient receptive, calm and cooperative. He is isolative to his room except for meals but did participate in outdoor activity today. He contracts for safety and  remains safe on the unit at this time.

## 2022-09-04 NOTE — Progress Notes (Signed)
Patient calm and pleasant during assessment denying SI/HI/AVH. Pt compliant with medication administration per MD orders. Pt given education, support, and encouragement to be active in his treatment plan. Pt being monitored Q 15 minutes for safety per unit protocol, remains safe on the unit  

## 2022-09-05 NOTE — Plan of Care (Signed)
  Problem: Education: Goal: Knowledge of General Education information will improve Description: Including pain rating scale, medication(s)/side effects and non-pharmacologic comfort measures Outcome: Progressing   Problem: Health Behavior/Discharge Planning: Goal: Ability to manage health-related needs will improve Outcome: Progressing   Problem: Clinical Measurements: Goal: Ability to maintain clinical measurements within normal limits will improve Outcome: Progressing Goal: Will remain free from infection Outcome: Progressing Goal: Diagnostic test results will improve Outcome: Progressing Goal: Respiratory complications will improve Outcome: Progressing Goal: Cardiovascular complication will be avoided Outcome: Progressing   Problem: Activity: Goal: Risk for activity intolerance will decrease Outcome: Progressing   Problem: Nutrition: Goal: Adequate nutrition will be maintained Outcome: Progressing   Problem: Coping: Goal: Level of anxiety will decrease Outcome: Progressing   Problem: Elimination: Goal: Will not experience complications related to urinary retention Outcome: Progressing   Problem: Pain Managment: Goal: General experience of comfort will improve Outcome: Progressing   Problem: Safety: Goal: Ability to remain free from injury will improve Outcome: Progressing   Problem: Skin Integrity: Goal: Risk for impaired skin integrity will decrease Outcome: Progressing   Problem: Education: Goal: Knowledge of [place] [facility] Education information/materials will improve Outcome: Progressing Goal: Emotional status will improve Outcome: Progressing Goal: Mental status will improve Outcome: Progressing Goal: Verbalization of understanding the information provided will improve Outcome: Progressing   Problem: Activity: Goal: Interest or engagement in activities will improve Outcome: Progressing Goal: Sleeping patterns will improve Outcome:  Progressing   Problem: Coping: Goal: Ability to verbalize frustrations and anger appropriately will improve Outcome: Progressing Goal: Ability to demonstrate self-control will improve Outcome: Progressing   Problem: Health Behavior/Discharge Planning: Goal: Identification of resources available to assist in meeting health care needs will improve Outcome: Progressing Goal: Compliance with treatment plan for underlying cause of condition will improve Outcome: Progressing   Problem: Physical Regulation: Goal: Ability to maintain clinical measurements within normal limits will improve Outcome: Progressing   Problem: Safety: Goal: Periods of time without injury will increase Outcome: Progressing   Problem: Activity: Goal: Will verbalize the importance of balancing activity with adequate rest periods Outcome: Progressing   Problem: Education: Goal: Will be free of psychotic symptoms Outcome: Progressing Goal: Knowledge of the prescribed therapeutic regimen will improve Outcome: Progressing   Problem: Coping: Goal: Coping ability will improve Outcome: Progressing Goal: Will verbalize feelings Outcome: Progressing   Problem: Health Behavior/Discharge Planning: Goal: Compliance with prescribed medication regimen will improve Outcome: Progressing   Problem: Nutritional: Goal: Ability to achieve adequate nutritional intake will improve Outcome: Progressing   Problem: Role Relationship: Goal: Ability to communicate needs accurately will improve Outcome: Progressing Goal: Ability to interact with others will improve Outcome: Progressing   Problem: Safety: Goal: Ability to redirect hostility and anger into socially appropriate behaviors will improve Outcome: Progressing Goal: Ability to remain free from injury will improve Outcome: Progressing   Problem: Self-Care: Goal: Ability to participate in self-care as condition permits will improve Outcome: Progressing    Problem: Self-Concept: Goal: Will verbalize positive feelings about self Outcome: Progressing   

## 2022-09-05 NOTE — Group Note (Signed)
Recreation Therapy Group Note   Group Topic:Leisure Education  Group Date: 09/05/2022 Start Time: 1000 End Time: 1055 Facilitators: Rosina Lowenstein, LRT, CTRS Location:  Dayroom  Group Description: Leisure. Patients were given the option to choose from singing karaoke, making origami, using oil pastels, or playing UNO. LRT and pts discussed the meaning of leisure, the importance of participating in leisure during their free time/when they're outside of the hospital, as well as how our leisure interests can also serve as coping skills. Pt identified two leisure interests and shared with the group.   Goal Area(s) Addressed:  Patient will identify a current leisure interest.  Patient will learn the definition of "leisure". Patient will practice making a positive decision. Patient will have the opportunity to try a new leisure activity. Patient will communicate with peers and LRT.   Affect/Mood: N/A   Participation Level: Did not attend    Clinical Observations/Individualized Feedback: Travis Sandoval did not attend group due to resting in his room.   Plan: Continue to engage patient in RT group sessions 2-3x/week.   Rosina Lowenstein, LRT, CTRS 09/05/2022 11:34 AM

## 2022-09-05 NOTE — Progress Notes (Signed)
[facility] MD Progress Note  09/05/2022 11:14 AM Tramone Bentzel  MRN:  [id number] Subjective: Follow-up for this 42 year old man with schizophrenia.  Patient's continues as he has pretty much every other day staying in bed most of the time but easily arousable and willing to engage in brief conversation.  He had a meeting on the phone with the potential living situation yesterday.  Staff report that he performed well but continues to show evidence of delusions of which we are well aware.  Patient has stated he does not want to go to any place that is not in [facility].  Otherwise no behavior problems.  Compliant with medicine. Principal Problem: Undifferentiated schizophrenia (HCC) Diagnosis: Principal Problem:   Undifferentiated schizophrenia (HCC)  Total Time spent with patient: 30 minutes  Past Psychiatric History: Past history of schizophrenia  Past Medical History:  Past Medical History:  Diagnosis Date   ADHD    Bipolar 1 disorder (HCC)    Hepatitis C    Schizoaffective disorder (HCC)    History reviewed. No pertinent surgical history. Family History:  Family History  Family history unknown: Yes   Family Psychiatric  History: See above Social History:  Social History   Substance and Sexual Activity  Alcohol Use Not Currently     Social History   Substance and Sexual Activity  Drug Use Not Currently    Social History   Socioeconomic History   Marital status: Unknown    Spouse name: Not on file   Number of children: Not on file   Years of education: Not on file   Highest education level: Not on file  Occupational History   Not on file  Tobacco Use   Smoking status: Every Day    Packs/day: 0.25    Years: 15.00    Additional pack years: 0.00    Total pack years: 3.75    Types: Cigarettes   Smokeless tobacco: Not on file  Vaping Use   Vaping Use: Not on file  Substance and Sexual Activity   Alcohol use: Not Currently   Drug use: Not Currently   Sexual  activity: Not Currently  Other Topics Concern   Not on file  Social History Narrative   Not on file   Social Determinants of Health   Financial Resource Strain: Not on file  Food Insecurity: Patient Declined (05/18/2022)   Hunger Vital Sign    Worried About Running Out of Food in the Last Year: Patient declined    Ran Out of Food in the Last Year: Patient declined  Transportation Needs: Patient Declined (05/18/2022)   PRAPARE - Administrator, Civil Service (Medical): Patient declined    Lack of Transportation (Non-Medical): Patient declined  Physical Activity: Not on file  Stress: Not on file  Social Connections: Not on file   Additional Social History:                         Sleep: Fair  Appetite:  Fair  Current Medications: Current Facility-Administered Medications  Medication Dose Route Frequency Provider Last Rate Last Admin   acetaminophen (TYLENOL) tablet 650 mg  650 mg Oral Q6H PRN Gabriel Cirri F, NP   650 mg at 08/28/22 2109   alum & mag hydroxide-simeth (MAALOX/MYLANTA) 200-200-20 MG/5ML suspension 30 mL  30 mL Oral Q4H PRN Gabriel Cirri F, NP   30 mL at 09/04/22 2102   ARIPiprazole ER (ABILIFY MAINTENA) injection 400 mg  400 mg Intramuscular  Q28 days Greydon Betke, [name], MD   400 mg at 08/22/22 1615   cloZAPine (CLOZARIL) tablet 300 mg  300 mg Oral QHS Alexah Kivett T, MD   300 mg at 09/04/22 2102   docusate sodium (COLACE) capsule 200 mg  200 mg Oral BID Malie Kashani, [name], MD   200 mg at 07/27/22 0939   famotidine (PEPCID) tablet 20 mg  20 mg Oral BID Daquisha Clermont T, MD   20 mg at 07/20/22 1740   guaiFENesin (ROBITUSSIN) 100 MG/5ML liquid 5 mL  5 mL Oral Q4H PRN Gerhardt Gleed, [name], MD   5 mL at 08/22/22 2110   hydrOXYzine (ATARAX) tablet 50 mg  50 mg Oral Q6H PRN Lovel Suazo, [name], MD   50 mg at 08/10/22 2143   magnesium citrate solution 1 Bottle  1 Bottle Oral Daily PRN Jaynie Bream, RPH   1 Bottle at 07/19/22 1643   magnesium hydroxide  (MILK OF MAGNESIA) suspension 30 mL  30 mL Oral Daily PRN Jaynie Bream, RPH   30 mL at 08/27/22 2117   mineral oil liquid   Oral Daily PRN Sarina Ill, DO   Given at 07/17/22 1201   nicotine polacrilex (NICORETTE) gum 2 mg  2 mg Oral PRN Riley Papin, [name], MD   2 mg at 04/23/22 2200   polyethylene glycol (MIRALAX / GLYCOLAX) packet 17 g  17 g Oral Daily Makaleigh Reinard, [name], MD   17 g at 09/05/22 8295   senna-docusate (Senokot-S) tablet 2 tablet  2 tablet Oral Daily PRN Jacub Waiters, [name], MD   2 tablet at 07/14/22 0809   ziprasidone (GEODON) injection 20 mg  20 mg Intramuscular Q12H PRN Reggie Pile, MD        Lab Results:  Results for orders placed or performed during the hospital encounter of 04/16/22 (from the past 48 hour(s))  CBC with Differential/Platelet     Status: None   Collection Time: 09/04/22 10:11 AM  Result Value Ref Range   WBC 6.8 4.0 - 10.5 K/uL   RBC 5.11 4.22 - 5.81 MIL/uL   Hemoglobin 14.3 13.0 - 17.0 g/dL   HCT 62.1 30.8 - 65.7 %   MCV 85.9 80.0 - 100.0 fL   MCH 28.0 26.0 - 34.0 pg   MCHC 32.6 30.0 - 36.0 g/dL   RDW 84.6 96.2 - 95.2 %   Platelets 201 150 - 400 K/uL   nRBC 0.0 0.0 - 0.2 %   Neutrophils Relative % 50 %   Neutro Abs 3.4 1.7 - 7.7 K/uL   Lymphocytes Relative 38 %   Lymphs Abs 2.6 0.7 - 4.0 K/uL   Monocytes Relative 8 %   Monocytes Absolute 0.5 0.1 - 1.0 K/uL   Eosinophils Relative 3 %   Eosinophils Absolute 0.2 0.0 - 0.5 K/uL   Basophils Relative 1 %   Basophils Absolute 0.1 0.0 - 0.1 K/uL   Immature Granulocytes 0 %   Abs Immature Granulocytes 0.01 0.00 - 0.07 K/uL    Comment: Performed at [facility], [address] [place], [place] [address]    Blood Alcohol level:  Lab Results  Component Value Date   [facility] <10 04/15/2022    Metabolic Disorder Labs: Lab Results  Component Value Date   HGBA1C 5.5 04/24/2022   MPG 111 04/24/2022   No results found for: "PROLACTIN" Lab Results  Component Value Date   CHOL 220  (H) 04/24/2022   TRIG 700 (H) 04/24/2022  HDL 35 (L) 04/24/2022   CHOLHDL 6.3 04/24/2022   VLDL UNABLE TO CALCULATE IF TRIGLYCERIDE OVER 400 mg/dL 16/01/9603   LDLCALC UNABLE TO CALCULATE IF TRIGLYCERIDE OVER 400 mg/dL 54/12/8117    Physical Findings: AIMS: Facial and Oral Movements Muscles of Facial Expression: None, normal Lips and Perioral Area: None, normal Jaw: None, normal Tongue: None, normal,Extremity Movements Upper (arms, wrists, hands, fingers): None, normal Lower (legs, knees, ankles, toes): None, normal, Trunk Movements Neck, shoulders, hips: None, normal, Overall Severity Severity of abnormal movements (highest score from questions above): None, normal Incapacitation due to abnormal movements: None, normal Patient's awareness of abnormal movements (rate only patient's report): No Awareness, Dental Status Current problems with teeth and/or dentures?: No Does patient usually wear dentures?: No  CIWA:    COWS:     Musculoskeletal: Strength & Muscle Tone: within normal limits Gait & Station: normal Patient leans: N/A  Psychiatric Specialty Exam:  Presentation  General Appearance:  Disheveled  Eye Contact: Minimal  Speech: Pressured  Speech Volume: Normal  Handedness: Right   Mood and Affect  Mood: Euphoric; Irritable  Affect: Inappropriate   Thought Process  Thought Processes: Disorganized  Descriptions of Associations:Loose  Orientation:Partial  Thought Content:Illogical; Scattered  History of Schizophrenia/Schizoaffective disorder:No data recorded Duration of Psychotic Symptoms:No data recorded Hallucinations:No data recorded Ideas of Reference:Delusions; Paranoia  Suicidal Thoughts:No data recorded Homicidal Thoughts:No data recorded  Sensorium  Memory: Immediate Poor; Recent Poor; Remote Poor  Judgment: Poor  Insight: Poor   Executive Functions  Concentration: Fair  Attention Span: Fair  Recall: Poor  Fund  of Knowledge: Poor  Language: Poor   Psychomotor Activity  Psychomotor Activity:No data recorded  Assets  Assets: Desire for Improvement; Social Support   Sleep  Sleep:No data recorded   Physical Exam: Physical Exam Vitals and nursing note reviewed.  Constitutional:      Appearance: Normal appearance.  HENT:     Head: Normocephalic and atraumatic.     Mouth/Throat:     Pharynx: Oropharynx is clear.  Eyes:     Pupils: Pupils are equal, round, and reactive to light.  Cardiovascular:     Rate and Rhythm: Normal rate and regular rhythm.  Pulmonary:     Effort: Pulmonary effort is normal.     Breath sounds: Normal breath sounds.  Abdominal:     General: Abdomen is flat.     Palpations: Abdomen is soft.  Musculoskeletal:        General: Normal range of motion.  Skin:    General: Skin is warm and dry.  Neurological:     General: No focal deficit present.     Mental Status: He is alert. Mental status is at baseline.  Psychiatric:        Mood and Affect: Mood normal.        Thought Content: Thought content is delusional.    Review of Systems  Constitutional: Negative.   HENT: Negative.    Eyes: Negative.   Respiratory: Negative.    Cardiovascular: Negative.   Gastrointestinal: Negative.   Musculoskeletal: Negative.   Skin: Negative.   Neurological: Negative.   Psychiatric/Behavioral: Negative.     Blood pressure 110/75, pulse 91, temperature 98 F (36.7 C), temperature source Oral, resp. rate 20, height 5\' 9"  (1.753 m), weight 96.4 kg, SpO2 97 %. Body mass index is 31.38 kg/m.   Treatment Plan Summary: Plan no change to continue current plan.  Continue clozapine.  We are working hard on trying to arrange for disposition.  He does have a guardian so even if he does not feel completely satisfied with a living situation we could discharge him as long as we have the finances in place although now this sounds like it is more of a problem.  Mordecai Rasmussen,  MD 09/05/2022, 11:14 AM

## 2022-09-05 NOTE — Progress Notes (Signed)
Patient calm and pleasant during assessment denying SI/HI/AVH. Pt compliant with medication administration per MD orders. Pt given education, support, and encouragement to be active in his treatment plan. Pt being monitored Q 15 minutes for safety per unit protocol, remains safe on the unit  

## 2022-09-05 NOTE — Group Note (Signed)
[facility] LCSW Group Therapy Note   Group Date: 09/05/2022 Start Time: 1320 End Time: 1400   Type of Therapy/Topic:  Group Therapy:  Balance in Life  Participation Level:  Did Not Attend   Description of Group:    This group will address the concept of balance and how it feels and looks when one is unbalanced. Patients will be encouraged to process areas in their lives that are out of balance, and identify reasons for remaining unbalanced. Facilitators will guide patients utilizing problem- solving interventions to address and correct the stressor making their life unbalanced. Understanding and applying boundaries will be explored and addressed for obtaining  and maintaining a balanced life. Patients will be encouraged to explore ways to assertively make their unbalanced needs known to significant others in their lives, using other group members and facilitator for support and feedback.  Therapeutic Goals: Patient will identify two or more emotions or situations they have that consume much of in their lives. Patient will identify signs/triggers that life has become out of balance:  Patient will identify two ways to set boundaries in order to achieve balance in their lives:  Patient will demonstrate ability to communicate their needs through discussion and/or role plays  Summary of Patient Progress: X   Therapeutic Modalities:   Cognitive Behavioral Therapy Solution-Focused Therapy Assertiveness Training   Glenis Smoker, LCSW

## 2022-09-05 NOTE — B[facility] Counselor (Signed)
CSW has sent referral information to Gloria with [facility].  Joriel Streety, MSW, LCSW 09/05/2022 11:34 AM  

## 2022-09-05 NOTE — Progress Notes (Signed)
D- Patient alert and oriented x 3-4. Affect flat/mood congruent. Denies SI/ HI/ AVH. Noted AVH, responding to internal stimuli talking to himself in his room. Patient denies pain. Patient denies depression and anxiety.  A- Scheduled medications administered to patient, per MD orders except for Pepcid and Colace. Support and encouragement provided.  Routine safety checks conducted every 15 minutes without incident.  Patient informed to notify staff with problems or concerns and verbalizes understanding. R- No adverse drug reactions noted.  Patient compliant with medications and treatment plan. Patient isolative to his room except for meals.  Patient contracts for safety and  remains safe on the unit at this time.

## 2022-09-05 NOTE — [place] FL2 (Signed)
[place] MEDICAID FL2 LEVEL OF CARE FORM     IDENTIFICATION  Patient Name: Travis Sandoval Birthdate: Jun 06, 1980 Sex: male Admission Date (Current Location): 04/16/2022  [place] and [place] Number:  [address] [id number] [facility] Facility and Address:  [facility], [address] [place], [place] [address]      Provider Number: [id number]  Attending Physician Name and Address:  Audery Amel, MD  Relative Name and Phone Number:  Gwyndolyn Saxon DSS Guardian, [phone]    Current Level of Care: Hospital Recommended Level of Care: Other (Comment) (Assisted Living) Prior Approval Number:    Date Approved/Denied:   PASRR Number:    Discharge Plan:  (Assisted Living)    Current Diagnoses: Patient Active Problem List   Diagnosis Date Noted   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   Attention deficit hyperactivity disorder (ADHD) 04/15/2022    Orientation RESPIRATION BLADDER Height & Weight     Self, Time, Place  Normal Continent Weight: 96.4 kg Height:  5\' 9"  (175.3 cm)  BEHAVIORAL SYMPTOMS/MOOD NEUROLOGICAL BOWEL NUTRITION STATUS   (None)  (none) Continent  (Normal Diet)  AMBULATORY STATUS COMMUNICATION OF NEEDS Skin   Independent Verbally Normal                       Personal Care Assistance Level of Assistance   (No assistance w/ ADLs)           Functional Limitations Info   (none)          SPECIAL CARE FACTORS FREQUENCY   (None)                    Contractures Contractures Info: Not present    Additional Factors Info   (None) Code Status Info: Full Allergies Info: Codiene           Current Medications (09/05/2022):  This is the current hospital active medication list Current Facility-Administered Medications  Medication Dose Route Frequency Provider Last Rate Last Admin   acetaminophen (TYLENOL) tablet 650 mg  650 mg Oral Q6H PRN Vanetta Mulders, NP   650 mg at 08/28/22 2109   alum &  mag hydroxide-simeth (MAALOX/MYLANTA) 200-200-20 MG/5ML suspension 30 mL  30 mL Oral Q4H PRN Gabriel Cirri F, NP   30 mL at 09/04/22 2102   ARIPiprazole ER (ABILIFY MAINTENA) injection 400 mg  400 mg Intramuscular Q28 days [name], [name], MD   400 mg at 08/22/22 1615   cloZAPine (CLOZARIL) tablet 300 mg  300 mg Oral QHS [name], [name], MD   300 mg at 09/04/22 2102   docusate sodium (COLACE) capsule 200 mg  200 mg Oral BID [name], [name], MD   200 mg at 07/27/22 0939   famotidine (PEPCID) tablet 20 mg  20 mg Oral BID [name], [name], MD   20 mg at 07/20/22 1740   guaiFENesin (ROBITUSSIN) 100 MG/5ML liquid 5 mL  5 mL Oral Q4H PRN [name], [name], MD   5 mL at 08/22/22 2110   hydrOXYzine (ATARAX) tablet 50 mg  50 mg Oral Q6H PRN [name], [name], MD   50 mg at 08/10/22 2143   magnesium citrate solution 1 Bottle  1 Bottle Oral Daily PRN Jaynie Bream, RPH   1 Bottle at 07/19/22 1643   magnesium hydroxide (MILK OF MAGNESIA) suspension 30 mL  30 mL Oral Daily PRN Jaynie Bream, RPH   30 mL at 08/27/22 2117   mineral oil liquid  Oral Daily PRN Sarina Ill, DO   Given at 07/17/22 1201   nicotine polacrilex (NICORETTE) gum 2 mg  2 mg Oral PRN [name], [name], MD   2 mg at 04/23/22 2200   polyethylene glycol (MIRALAX / GLYCOLAX) packet 17 g  17 g Oral Daily [name], [name], MD   17 g at 09/04/22 1008   senna-docusate (Senokot-S) tablet 2 tablet  2 tablet Oral Daily PRN [name], [name], MD   2 tablet at 07/14/22 0809   ziprasidone (GEODON) injection 20 mg  20 mg Intramuscular Q12H PRN Reggie Pile, MD         Discharge Medications: Please see discharge summary for a list of discharge medications.  Relevant Imaging Results:  Relevant Lab Results:   Additional Information    Corky Crafts, LCSWA

## 2022-09-06 NOTE — Progress Notes (Signed)
Pt denies SI/HI/AVH and verbally agrees to approach staff if these become apparent or before harming themselves/others. Rates depression 0/10. Rates anxiety 0/10. Rates pain 0/10.  Scheduled medications administered to pt, per MD orders. RN provided support and encouragement to pt. Q15 min safety checks implemented and continued. Pt is safe on the unit. Plan of care on going and no other concerns expressed at this time.  09/06/22 0851  Psych Admission Type (Psych Patients Only)  Admission Status Voluntary  Psychosocial Assessment  Patient Complaints None  Eye Contact Brief  Facial Expression Flat  Affect Flat  Speech Logical/coherent  Interaction Isolative;Avoidant  Motor Activity Other (Comment) (WDL)  Appearance/Hygiene In scrubs;Unremarkable  Behavior Characteristics Cooperative;Appropriate to situation;Calm  Mood Preoccupied;Pleasant  Aggressive Behavior  Effect No apparent injury  Thought Process  Coherency WDL  Content Delusions  Delusions None reported or observed  Perception Hallucinations  Hallucination Auditory  Judgment Limited  Confusion None  Danger to Self  Current suicidal ideation? Denies  Danger to Others  Danger to Others None reported or observed

## 2022-09-06 NOTE — Progress Notes (Signed)
[facility] MD Progress Note  09/06/2022 4:33 PM Travis Sandoval  MRN:  [id number] Subjective: Follow-up 42 year old man with schizophrenia.  Patient has no new complaints.  Behavior unchanged.  He got turned down by the group home he interviewed with. Principal Problem: Undifferentiated schizophrenia (HCC) Diagnosis: Principal Problem:   Undifferentiated schizophrenia (HCC)  Total Time spent with patient: 30 minutes  Past Psychiatric History: Schizophrenia  Past Medical History:  Past Medical History:  Diagnosis Date   ADHD    Bipolar 1 disorder (HCC)    Hepatitis C    Schizoaffective disorder (HCC)    History reviewed. No pertinent surgical history. Family History:  Family History  Family history unknown: Yes   Family Psychiatric  History: See previous Social History:  Social History   Substance and Sexual Activity  Alcohol Use Not Currently     Social History   Substance and Sexual Activity  Drug Use Not Currently    Social History   Socioeconomic History   Marital status: Unknown    Spouse name: Not on file   Number of children: Not on file   Years of education: Not on file   Highest education level: Not on file  Occupational History   Not on file  Tobacco Use   Smoking status: Every Day    Packs/day: 0.25    Years: 15.00    Additional pack years: 0.00    Total pack years: 3.75    Types: Cigarettes   Smokeless tobacco: Not on file  Vaping Use   Vaping Use: Not on file  Substance and Sexual Activity   Alcohol use: Not Currently   Drug use: Not Currently   Sexual activity: Not Currently  Other Topics Concern   Not on file  Social History Narrative   Not on file   Social Determinants of Health   Financial Resource Strain: Not on file  Food Insecurity: Patient Declined (05/18/2022)   Hunger Vital Sign    Worried About Running Out of Food in the Last Year: Patient declined    Ran Out of Food in the Last Year: Patient declined  Transportation Needs: Patient  Declined (05/18/2022)   PRAPARE - Administrator, Civil Service (Medical): Patient declined    Lack of Transportation (Non-Medical): Patient declined  Physical Activity: Not on file  Stress: Not on file  Social Connections: Not on file   Additional Social History:                         Sleep: Fair  Appetite:  Fair  Current Medications: Current Facility-Administered Medications  Medication Dose Route Frequency Provider Last Rate Last Admin   acetaminophen (TYLENOL) tablet 650 mg  650 mg Oral Q6H PRN Gabriel Cirri F, NP   650 mg at 08/28/22 2109   alum & mag hydroxide-simeth (MAALOX/MYLANTA) 200-200-20 MG/5ML suspension 30 mL  30 mL Oral Q4H PRN Gabriel Cirri F, NP   30 mL at 09/05/22 2053   ARIPiprazole ER (ABILIFY MAINTENA) injection 400 mg  400 mg Intramuscular Q28 days [place] Souter T, MD   400 mg at 08/22/22 1615   cloZAPine (CLOZARIL) tablet 300 mg  300 mg Oral QHS Jisel Fleet T, MD   300 mg at 09/05/22 2053   docusate sodium (COLACE) capsule 200 mg  200 mg Oral BID Kimmy Parish T, MD   200 mg at 07/27/22 0939   famotidine (PEPCID) tablet 20 mg  20 mg Oral BID Kalep Full  T, MD   20 mg at 07/20/22 1740   guaiFENesin (ROBITUSSIN) 100 MG/5ML liquid 5 mL  5 mL Oral Q4H PRN Brittyn Salaz T, MD   5 mL at 08/22/22 2110   hydrOXYzine (ATARAX) tablet 50 mg  50 mg Oral Q6H PRN Tali Coster T, MD   50 mg at 08/10/22 2143   magnesium citrate solution 1 Bottle  1 Bottle Oral Daily PRN Jaynie Bream, RPH   1 Bottle at 07/19/22 1643   magnesium hydroxide (MILK OF MAGNESIA) suspension 30 mL  30 mL Oral Daily PRN Jaynie Bream, RPH   30 mL at 08/27/22 2117   mineral oil liquid   Oral Daily PRN Sarina Ill, DO   Given at 07/17/22 1201   nicotine polacrilex (NICORETTE) gum 2 mg  2 mg Oral PRN Zaryia Markel, [name], MD   2 mg at 04/23/22 2200   polyethylene glycol (MIRALAX / GLYCOLAX) packet 17 g  17 g Oral Daily Maile Linford, [name], MD   17 g at 09/05/22  1610   senna-docusate (Senokot-S) tablet 2 tablet  2 tablet Oral Daily PRN Charidy Cappelletti, [name], MD   2 tablet at 07/14/22 0809   ziprasidone (GEODON) injection 20 mg  20 mg Intramuscular Q12H PRN Reggie Pile, MD        Lab Results: No results found for this or any previous visit (from the past 48 hour(s)).  Blood Alcohol level:  Lab Results  Component Value Date   ETH <10 04/15/2022    Metabolic Disorder Labs: Lab Results  Component Value Date   HGBA1C 5.5 04/24/2022   MPG 111 04/24/2022   No results found for: "PROLACTIN" Lab Results  Component Value Date   CHOL 220 (H) 04/24/2022   TRIG 700 (H) 04/24/2022   HDL 35 (L) 04/24/2022   CHOLHDL 6.3 04/24/2022   VLDL UNABLE TO CALCULATE IF TRIGLYCERIDE OVER 400 mg/dL 96/07/5407   LDLCALC UNABLE TO CALCULATE IF TRIGLYCERIDE OVER 400 mg/dL 81/19/1478    Physical Findings: AIMS: Facial and Oral Movements Muscles of Facial Expression: None, normal Lips and Perioral Area: None, normal Jaw: None, normal Tongue: None, normal,Extremity Movements Upper (arms, wrists, hands, fingers): None, normal Lower (legs, knees, ankles, toes): None, normal, Trunk Movements Neck, shoulders, hips: None, normal, Overall Severity Severity of abnormal movements (highest score from questions above): None, normal Incapacitation due to abnormal movements: None, normal Patient's awareness of abnormal movements (rate only patient's report): No Awareness, Dental Status Current problems with teeth and/or dentures?: No Does patient usually wear dentures?: No  CIWA:    COWS:     Musculoskeletal: Strength & Muscle Tone: within normal limits Gait & Station: normal Patient leans: N/A  Psychiatric Specialty Exam:  Presentation  General Appearance:  Disheveled  Eye Contact: Minimal  Speech: Pressured  Speech Volume: Normal  Handedness: Right   Mood and Affect  Mood: Euphoric; Irritable  Affect: Inappropriate   Thought Process  Thought  Processes: Disorganized  Descriptions of Associations:Loose  Orientation:Partial  Thought Content:Illogical; Scattered  History of Schizophrenia/Schizoaffective disorder:No data recorded Duration of Psychotic Symptoms:No data recorded Hallucinations:No data recorded Ideas of Reference:Delusions; Paranoia  Suicidal Thoughts:No data recorded Homicidal Thoughts:No data recorded  Sensorium  Memory: Immediate Poor; Recent Poor; Remote Poor  Judgment: Poor  Insight: Poor   Executive Functions  Concentration: Fair  Attention Span: Fair  Recall: Poor  Fund of Knowledge: Poor  Language: Poor   Psychomotor Activity  Psychomotor Activity:No data recorded  Assets  Assets: Desire  for Improvement; Social Support   Sleep  Sleep:No data recorded   Physical Exam: Physical Exam Vitals reviewed.  Constitutional:      Appearance: Normal appearance.  HENT:     Head: Normocephalic and atraumatic.     Mouth/Throat:     Pharynx: Oropharynx is clear.  Eyes:     Pupils: Pupils are equal, round, and reactive to light.  Cardiovascular:     Rate and Rhythm: Normal rate and regular rhythm.  Pulmonary:     Effort: Pulmonary effort is normal.     Breath sounds: Normal breath sounds.  Abdominal:     General: Abdomen is flat.     Palpations: Abdomen is soft.  Musculoskeletal:        General: Normal range of motion.  Skin:    General: Skin is warm and dry.  Neurological:     General: No focal deficit present.     Mental Status: He is alert. Mental status is at baseline.  Psychiatric:        Attention and Perception: Attention normal.        Mood and Affect: Mood normal. Affect is blunt.        Speech: He is noncommunicative.        Thought Content: Thought content normal.    Review of Systems  Constitutional: Negative.   HENT: Negative.    Eyes: Negative.   Respiratory: Negative.    Cardiovascular: Negative.   Gastrointestinal: Negative.    Musculoskeletal: Negative.   Skin: Negative.   Neurological: Negative.   Psychiatric/Behavioral: Negative.     Blood pressure 102/73, pulse 93, temperature 97.8 F (36.6 C), temperature source Oral, resp. rate 20, height 5\' 9"  (1.753 m), weight 96.4 kg, SpO2 97 %. Body mass index is 31.38 kg/m.   Treatment Plan Summary: Plan no change to treatment plan.  Continue medicine.  Continue looking for discharge options  Mordecai Rasmussen, MD 09/06/2022, 4:33 PM

## 2022-09-06 NOTE — Group Note (Signed)
[facility] LCSW Group Therapy Note   Group Date: 09/06/2022 Start Time: 1300 End Time: 1400  Type of Therapy and Topic:  Group Therapy:  Feelings around Relapse and Recovery  Participation Level:  Did Not Attend   Mood:  Description of Group:    Patients in this group will discuss emotions they experience before and after a relapse. They will process how experiencing these feelings, or avoidance of experiencing them, relates to having a relapse. Facilitator will guide patients to explore emotions they have related to recovery. Patients will be encouraged to process which emotions are more powerful. They will be guided to discuss the emotional reaction significant others in their lives may have to patients' relapse or recovery. Patients will be assisted in exploring ways to respond to the emotions of others without this contributing to a relapse.  Therapeutic Goals: Patient will identify two or more emotions that lead to relapse for them:  Patient will identify two emotions that result when they relapse:  Patient will identify two emotions related to recovery:  Patient will demonstrate ability to communicate their needs through discussion and/or role plays.   Summary of Patient Progress: Patient did not attend group despite encouraged participation.     Therapeutic Modalities:   Cognitive Behavioral Therapy Solution-Focused Therapy Assertiveness Training Relapse Prevention Therapy   Antoney Biven W Jaben Benegas, LCSWA 

## 2022-09-06 NOTE — Plan of Care (Signed)
  Problem: Education: Goal: Knowledge of General Education information will improve Description: Including pain rating scale, medication(s)/side effects and non-pharmacologic comfort measures Outcome: Progressing   Problem: Activity: Goal: Risk for activity intolerance will decrease Outcome: Progressing   Problem: Nutrition: Goal: Adequate nutrition will be maintained Outcome: Progressing   Problem: Coping: Goal: Level of anxiety will decrease Outcome: Progressing   

## 2022-09-06 NOTE — Progress Notes (Signed)
Patient calm and pleasant during assessment denying SI/HI/AVH. Pt compliant with medication administration per MD orders. Pt given education, support, and encouragement to be active in his treatment plan. Pt being monitored Q 15 minutes for safety per unit protocol, remains safe on the unit  

## 2022-09-07 NOTE — Plan of Care (Signed)
  Problem: Education: Goal: Knowledge of General Education information will improve Description: Including pain rating scale, medication(s)/side effects and non-pharmacologic comfort measures Outcome: Progressing   Problem: Nutrition: Goal: Adequate nutrition will be maintained Outcome: Progressing   Problem: Coping: Goal: Level of anxiety will decrease Outcome: Progressing   Problem: Activity: Goal: Risk for activity intolerance will decrease Outcome: Not Progressing   

## 2022-09-07 NOTE — [facility] Group Notes (Signed)
[facility] Group Notes:  (Nursing/MHT/Case Management/Adjunct)  Date:  09/07/2022  Time:  11:25 AM  Type of Therapy:  Psychoeducational Skills  Participation Level:  Did Not Attend  Participation Quality:   na  Affect:   na  Cognitive:   na  Insight:  None  Engagement in Group:   na  Modes of Intervention:   na  Summary of Progress/Problems:   Patients were given two poems to read one by Jacquelyne Balint '' The owl and the Ch[place]panzee. '' And ''watch your thoughts '' by Renata Caprice. Pt were then asked to reflect on how anxiety/negative behavioral patterns have impacted their lives and mental health, and what healthy or positive coping mechanisms can be implemented to promote mental wellbeing.  Pt did not attend. Travis Sandoval 09/07/2022, 11:25 AM

## 2022-09-07 NOTE — Progress Notes (Signed)
[facility] MD Progress Note  09/07/2022 12:52 PM Travis Sandoval  MRN:  [id number] Subjective: Travis Sandoval is seen on rounds.  Nurses report no issues.  He has no complaints.  He states he is sleeping well and denies any side effects from his medications. Principal Problem: Undifferentiated schizophrenia (HCC) Diagnosis: Principal Problem:   Undifferentiated schizophrenia (HCC)  Total Time spent with patient: 15 minutes  Past Psychiatric History: Schizophrenia  Past Medical History:  Past Medical History:  Diagnosis Date   ADHD    Bipolar 1 disorder (HCC)    Hepatitis C    Schizoaffective disorder (HCC)    History reviewed. No pertinent surgical history. Family History:  Family History  Family history unknown: Yes   Family Psychiatric  History: Unremarkable Social History:  Social History   Substance and Sexual Activity  Alcohol Use Not Currently     Social History   Substance and Sexual Activity  Drug Use Not Currently    Social History   Socioeconomic History   Marital status: Unknown    Spouse name: Not on file   Number of children: Not on file   Years of education: Not on file   Highest education level: Not on file  Occupational History   Not on file  Tobacco Use   Smoking status: Every Day    Packs/day: 0.25    Years: 15.00    Additional pack years: 0.00    Total pack years: 3.75    Types: Cigarettes   Smokeless tobacco: Not on file  Vaping Use   Vaping Use: Not on file  Substance and Sexual Activity   Alcohol use: Not Currently   Drug use: Not Currently   Sexual activity: Not Currently  Other Topics Concern   Not on file  Social History Narrative   Not on file   Social Determinants of Health   Financial Resource Strain: Not on file  Food Insecurity: Patient Declined (05/18/2022)   Hunger Vital Sign    Worried About Running Out of Food in the Last Year: Patient declined    Ran Out of Food in the Last Year: Patient declined  Transportation Needs: Patient  Declined (05/18/2022)   PRAPARE - Administrator, Civil Service (Medical): Patient declined    Lack of Transportation (Non-Medical): Patient declined  Physical Activity: Not on file  Stress: Not on file  Social Connections: Not on file   Additional Social History:                         Sleep: Good  Appetite:  Good  Current Medications: Current Facility-Administered Medications  Medication Dose Route Frequency Provider Last Rate Last Admin   acetaminophen (TYLENOL) tablet 650 mg  650 mg Oral Q6H PRN Gabriel Cirri F, NP   650 mg at 08/28/22 2109   alum & mag hydroxide-simeth (MAALOX/MYLANTA) 200-200-20 MG/5ML suspension 30 mL  30 mL Oral Q4H PRN Gabriel Cirri F, NP   30 mL at 09/06/22 2136   ARIPiprazole ER (ABILIFY MAINTENA) injection 400 mg  400 mg Intramuscular Q28 days [name], [name], MD   400 mg at 08/22/22 1615   cloZAPine (CLOZARIL) tablet 300 mg  300 mg Oral QHS [name], [name], MD   300 mg at 09/06/22 2136   docusate sodium (COLACE) capsule 200 mg  200 mg Oral BID [name], [name], MD   200 mg at 07/27/22 0939   famotidine (PEPCID) tablet 20 mg  20 mg Oral  BID [name], [name], MD   20 mg at 07/20/22 1740   guaiFENesin (ROBITUSSIN) 100 MG/5ML liquid 5 mL  5 mL Oral Q4H PRN [name], [name], MD   5 mL at 08/22/22 2110   hydrOXYzine (ATARAX) tablet 50 mg  50 mg Oral Q6H PRN [name], [name], MD   50 mg at 08/10/22 2143   magnesium citrate solution 1 Bottle  1 Bottle Oral Daily PRN Jaynie Bream, RPH   1 Bottle at 07/19/22 1643   magnesium hydroxide (MILK OF MAGNESIA) suspension 30 mL  30 mL Oral Daily PRN Jaynie Bream, RPH   30 mL at 08/27/22 2117   mineral oil liquid   Oral Daily PRN Sarina Ill, DO   Given at 07/17/22 1201   nicotine polacrilex (NICORETTE) gum 2 mg  2 mg Oral PRN [name], [name], MD   2 mg at 04/23/22 2200   polyethylene glycol (MIRALAX / GLYCOLAX) packet 17 g  17 g Oral Daily [name], [name], MD   17 g at 09/05/22  8413   senna-docusate (Senokot-S) tablet 2 tablet  2 tablet Oral Daily PRN [name], [name], MD   2 tablet at 07/14/22 0809   ziprasidone (GEODON) injection 20 mg  20 mg Intramuscular Q12H PRN Reggie Pile, MD        Lab Results: No results found for this or any previous visit (from the past 48 hour(s)).  Blood Alcohol level:  Lab Results  Component Value Date   ETH <10 04/15/2022    Metabolic Disorder Labs: Lab Results  Component Value Date   HGBA1C 5.5 04/24/2022   MPG 111 04/24/2022   No results found for: "PROLACTIN" Lab Results  Component Value Date   CHOL 220 (H) 04/24/2022   TRIG 700 (H) 04/24/2022   HDL 35 (L) 04/24/2022   CHOLHDL 6.3 04/24/2022   VLDL UNABLE TO CALCULATE IF TRIGLYCERIDE OVER 400 mg/dL 24/40/1027   LDLCALC UNABLE TO CALCULATE IF TRIGLYCERIDE OVER 400 mg/dL 25/36/6440    Physical Findings: AIMS: Facial and Oral Movements Muscles of Facial Expression: None, normal Lips and Perioral Area: None, normal Jaw: None, normal Tongue: None, normal,Extremity Movements Upper (arms, wrists, hands, fingers): None, normal Lower (legs, knees, ankles, toes): None, normal, Trunk Movements Neck, shoulders, hips: None, normal, Overall Severity Severity of abnormal movements (highest score from questions above): None, normal Incapacitation due to abnormal movements: None, normal Patient's awareness of abnormal movements (rate only patient's report): No Awareness, Dental Status Current problems with teeth and/or dentures?: No Does patient usually wear dentures?: No  CIWA:    COWS:     Musculoskeletal: Strength & Muscle Tone: within normal limits Gait & Station: normal Patient leans: N/A  Psychiatric Specialty Exam:  Presentation  General Appearance:  Disheveled  Eye Contact: Minimal  Speech: Pressured  Speech Volume: Normal  Handedness: Right   Mood and Affect  Mood: Euphoric; Irritable  Affect: Inappropriate   Thought Process  Thought  Processes: Disorganized  Descriptions of Associations:Loose  Orientation:Partial  Thought Content:Illogical; Scattered  History of Schizophrenia/Schizoaffective disorder:No data recorded Duration of Psychotic Symptoms:No data recorded Hallucinations:No data recorded Ideas of Reference:Delusions; Paranoia  Suicidal Thoughts:No data recorded Homicidal Thoughts:No data recorded  Sensorium  Memory: Immediate Poor; Recent Poor; Remote Poor  Judgment: Poor  Insight: Poor   Executive Functions  Concentration: Fair  Attention Span: Fair  Recall: Poor  Fund of Knowledge: Poor  Language: Poor   Psychomotor Activity  Psychomotor Activity:No data recorded  Assets  Assets: Desire for Improvement; Social Support   Sleep  Sleep:No data recorded    Blood pressure 106/70, pulse 96, temperature 97.8 F (36.6 C), temperature source Oral, resp. rate 19, height 5\' 9"  (1.753 m), weight 96.4 kg, SpO2 97 %. Body mass index is 31.38 kg/m.   Treatment Plan Summary: Daily contact with patient to assess and evaluate symptoms and progress in treatment, Medication management, and Plan continue current medications.  Sarina Ill, DO 09/07/2022, 12:52 PM

## 2022-09-07 NOTE — Progress Notes (Signed)
Pt denies SI/HI/AVH and verbally agrees to approach staff if these become apparent or before harming themselves/others. Rates depression 0/10. Rates anxiety 0/10. Rates pain 0/10. Scheduled medications administered to pt, per MD orders. RN provided support and encouragement to pt. Q15 min safety checks implemented and continued. Pt is safe on the unit. Plan of care on going and no other concerns expressed at this time.  09/07/22 0805  Psych Admission Type (Psych Patients Only)  Admission Status Voluntary  Psychosocial Assessment  Patient Complaints None  Eye Contact Brief  Facial Expression Flat  Affect Flat  Speech Logical/coherent  Interaction Isolative  Motor Activity Slow  Appearance/Hygiene Unremarkable  Behavior Characteristics Cooperative;Appropriate to situation;Calm  Mood Preoccupied  Aggressive Behavior  Effect No apparent injury  Thought Process  Coherency Tangential  Content Delusions  Delusions None reported or observed  Perception Hallucinations  Hallucination Auditory  Judgment Limited  Confusion None  Danger to Self  Current suicidal ideation? Denies  Danger to Others  Danger to Others None reported or observed

## 2022-09-07 NOTE — Group Note (Signed)
LCSW Group Therapy Note   Group Date: 09/07/2022 Start Time: 1340 End Time: 1420   Type of Therapy and Topic:  Group Therapy: Stress Management  Participation Level:  Did Not Attend    Summary of Patient Progress:  The patient did not attend group.    Janielle Mittelstadt S Jakhiya Brower, LCSWA 09/07/2022  2:33 PM    

## 2022-09-08 NOTE — Group Note (Signed)
Date:  09/08/2022 Time:  9:02 PM  Group Topic/Focus:  Wrap-Up Group:   The focus of this group is to help patients review their daily goal of treatment and discuss progress on daily workbooks.    Participation Level:  Minimal  Participation Quality:  Appropriate  Affect:  Appropriate and Flat  Cognitive:  Alert  Insight: Good  Engagement in Group:  Lacking and Limited  Modes of Intervention:  Limit-setting  Additional Comments:     Maglione,Simaya Lumadue E 09/08/2022, 9:02 PM

## 2022-09-08 NOTE — Progress Notes (Signed)
[facility] MD Progress Note  09/08/2022 2:16 PM Travis Sandoval  MRN:  [id number] Subjective: No changes with Travis Sandoval.  He states he is doing fine.  Nurses report no issues.  No side effects from his medication.  Pleasant and cooperative. Principal Problem: Undifferentiated schizophrenia (HCC) Diagnosis: Principal Problem:   Undifferentiated schizophrenia (HCC)  Total Time spent with patient: 15 minutes  Past Psychiatric History: Schizophrenia  Past Medical History:  Past Medical History:  Diagnosis Date   ADHD    Bipolar 1 disorder (HCC)    Hepatitis C    Schizoaffective disorder (HCC)    History reviewed. No pertinent surgical history. Family History:  Family History  Family history unknown: Yes   Family Psychiatric  History: Unremarkable Social History:  Social History   Substance and Sexual Activity  Alcohol Use Not Currently     Social History   Substance and Sexual Activity  Drug Use Not Currently    Social History   Socioeconomic History   Marital status: Unknown    Spouse name: Not on file   Number of children: Not on file   Years of education: Not on file   Highest education level: Not on file  Occupational History   Not on file  Tobacco Use   Smoking status: Every Day    Packs/day: 0.25    Years: 15.00    Additional pack years: 0.00    Total pack years: 3.75    Types: Cigarettes   Smokeless tobacco: Not on file  Vaping Use   Vaping Use: Not on file  Substance and Sexual Activity   Alcohol use: Not Currently   Drug use: Not Currently   Sexual activity: Not Currently  Other Topics Concern   Not on file  Social History Narrative   Not on file   Social Determinants of Health   Financial Resource Strain: Not on file  Food Insecurity: Patient Declined (05/18/2022)   Hunger Vital Sign    Worried About Running Out of Food in the Last Year: Patient declined    Ran Out of Food in the Last Year: Patient declined  Transportation Needs: Patient Declined  (05/18/2022)   PRAPARE - Administrator, Civil Service (Medical): Patient declined    Lack of Transportation (Non-Medical): Patient declined  Physical Activity: Not on file  Stress: Not on file  Social Connections: Not on file   Additional Social History:                         Sleep: Good  Appetite:  Good  Current Medications: Current Facility-Administered Medications  Medication Dose Route Frequency Provider Last Rate Last Admin   acetaminophen (TYLENOL) tablet 650 mg  650 mg Oral Q6H PRN Gabriel Cirri F, NP   650 mg at 08/28/22 2109   alum & mag hydroxide-simeth (MAALOX/MYLANTA) 200-200-20 MG/5ML suspension 30 mL  30 mL Oral Q4H PRN Gabriel Cirri F, NP   30 mL at 09/07/22 2135   ARIPiprazole ER (ABILIFY MAINTENA) injection 400 mg  400 mg Intramuscular Q28 days [name], [name], MD   400 mg at 08/22/22 1615   cloZAPine (CLOZARIL) tablet 300 mg  300 mg Oral QHS [name], [name], MD   300 mg at 09/07/22 2134   docusate sodium (COLACE) capsule 200 mg  200 mg Oral BID [name], [name], MD   200 mg at 07/27/22 0939   famotidine (PEPCID) tablet 20 mg  20 mg Oral BID [name], John  T, MD   20 mg at 07/20/22 1740   guaiFENesin (ROBITUSSIN) 100 MG/5ML liquid 5 mL  5 mL Oral Q4H PRN [name], [name], MD   5 mL at 08/22/22 2110   hydrOXYzine (ATARAX) tablet 50 mg  50 mg Oral Q6H PRN [name], [name], MD   50 mg at 08/10/22 2143   magnesium citrate solution 1 Bottle  1 Bottle Oral Daily PRN Jaynie Bream, RPH   1 Bottle at 07/19/22 1643   magnesium hydroxide (MILK OF MAGNESIA) suspension 30 mL  30 mL Oral Daily PRN Jaynie Bream, RPH   30 mL at 08/27/22 2117   mineral oil liquid   Oral Daily PRN Sarina Ill, DO   Given at 07/17/22 1201   nicotine polacrilex (NICORETTE) gum 2 mg  2 mg Oral PRN [name], [name], MD   2 mg at 04/23/22 2200   polyethylene glycol (MIRALAX / GLYCOLAX) packet 17 g  17 g Oral Daily [name], [name], MD   17 g at 09/05/22 1610    senna-docusate (Senokot-S) tablet 2 tablet  2 tablet Oral Daily PRN [name], [name], MD   2 tablet at 07/14/22 0809   ziprasidone (GEODON) injection 20 mg  20 mg Intramuscular Q12H PRN Reggie Pile, MD        Lab Results: No results found for this or any previous visit (from the past 48 hour(s)).  Blood Alcohol level:  Lab Results  Component Value Date   ETH <10 04/15/2022    Metabolic Disorder Labs: Lab Results  Component Value Date   HGBA1C 5.5 04/24/2022   MPG 111 04/24/2022   No results found for: "PROLACTIN" Lab Results  Component Value Date   CHOL 220 (H) 04/24/2022   TRIG 700 (H) 04/24/2022   HDL 35 (L) 04/24/2022   CHOLHDL 6.3 04/24/2022   VLDL UNABLE TO CALCULATE IF TRIGLYCERIDE OVER 400 mg/dL 96/07/5407   LDLCALC UNABLE TO CALCULATE IF TRIGLYCERIDE OVER 400 mg/dL 81/19/1478    Physical Findings: AIMS: Facial and Oral Movements Muscles of Facial Expression: None, normal Lips and Perioral Area: None, normal Jaw: None, normal Tongue: None, normal,Extremity Movements Upper (arms, wrists, hands, fingers): None, normal Lower (legs, knees, ankles, toes): None, normal, Trunk Movements Neck, shoulders, hips: None, normal, Overall Severity Severity of abnormal movements (highest score from questions above): None, normal Incapacitation due to abnormal movements: None, normal Patient's awareness of abnormal movements (rate only patient's report): No Awareness, Dental Status Current problems with teeth and/or dentures?: No Does patient usually wear dentures?: No  CIWA:    COWS:     Musculoskeletal: Strength & Muscle Tone: within normal limits Gait & Station: normal Patient leans: N/A  Psychiatric Specialty Exam:  Presentation  General Appearance:  Disheveled  Eye Contact: Minimal  Speech: Pressured  Speech Volume: Normal  Handedness: Right   Mood and Affect  Mood: Euphoric; Irritable  Affect: Inappropriate   Thought Process  Thought  Processes: Disorganized  Descriptions of Associations:Loose  Orientation:Partial  Thought Content:Illogical; Scattered  History of Schizophrenia/Schizoaffective disorder:No data recorded Duration of Psychotic Symptoms:No data recorded Hallucinations:No data recorded Ideas of Reference:Delusions; Paranoia  Suicidal Thoughts:No data recorded Homicidal Thoughts:No data recorded  Sensorium  Memory: Immediate Poor; Recent Poor; Remote Poor  Judgment: Poor  Insight: Poor   Executive Functions  Concentration: Fair  Attention Span: Fair  Recall: Poor  Fund of Knowledge: Poor  Language: Poor   Psychomotor Activity  Psychomotor Activity:No data recorded  Assets  Assets: Desire  for Improvement; Social Support   Sleep  Sleep:No data recorded    Blood pressure 107/78, pulse 93, temperature 98.1 F (36.7 C), temperature source Oral, resp. rate 19, height 5\' 9"  (1.753 m), weight 96.4 kg, SpO2 98 %. Body mass index is 31.38 kg/m.   Treatment Plan Summary: Daily contact with patient to assess and evaluate symptoms and progress in treatment, Medication management, and Plan continue current medications.  Jacarri Gesner Tresea Mall, DO 09/08/2022, 2:16 PM

## 2022-09-08 NOTE — Progress Notes (Signed)
Patient refused to take Miralax, stating that he didn't want it.

## 2022-09-08 NOTE — Plan of Care (Signed)

## 2022-09-08 NOTE — [facility] Group Notes (Signed)
[facility] Group Notes:  (Nursing/MHT/Case Management/Adjunct)  Date:  09/08/2022  Time:  10:51 AM  Type of Therapy:  Psychoeducational Skills  Participation Level:  Did Not Attend  Participation Quality:   na  Affect:   na  Cognitive:   na  Insight:  None  Engagement in Group:   na  Modes of Intervention:   na  Summary of Progress/Problems:  Travis Sandoval 09/08/2022, 10:51 AM

## 2022-09-08 NOTE — Plan of Care (Signed)
D- Patient alert and oriented. Patient presents in a pleasant mood on assessment stating that he slept ok last night and had no complaints to voice to this Clinical research associate. Patient denies SI, HI, AVH, and pain at this time. Patient also denies any signs/symptoms of depression and anxiety, stating "no ma'am", and that overall, he is feeling "alright". Patient has no stated goals for today.  A- Patient only wants to take scheduled Miralax, and refuses to take the Pepcid and Colace. Patient asked to take the Miralax later on today. Patient Support and encouragement provided. Routine safety checks conducted every 15 minutes. Patient informed to notify staff with problems or concerns.  R- No adverse drug reactions noted. Patient contracts for safety at this time. Patient compliant with some medications. Patient receptive, calm, and cooperative. Patient isolates to room, except for meals and medication. Patient remains safe at this time.  Problem: Education: Goal: Knowledge of General Education information will improve Description: Including pain rating scale, medication(s)/side effects and non-pharmacologic comfort measures Outcome: Progressing   Problem: Health Behavior/Discharge Planning: Goal: Ability to manage health-related needs will improve Outcome: Progressing   Problem: Clinical Measurements: Goal: Ability to maintain clinical measurements within normal limits will improve Outcome: Progressing Goal: Will remain free from infection Outcome: Progressing Goal: Diagnostic test results will improve Outcome: Progressing Goal: Respiratory complications will improve Outcome: Progressing Goal: Cardiovascular complication will be avoided Outcome: Progressing   Problem: Activity: Goal: Risk for activity intolerance will decrease Outcome: Progressing   Problem: Nutrition: Goal: Adequate nutrition will be maintained Outcome: Progressing   Problem: Coping: Goal: Level of anxiety will  decrease Outcome: Progressing   Problem: Elimination: Goal: Will not experience complications related to urinary retention Outcome: Progressing   Problem: Pain Managment: Goal: General experience of comfort will improve Outcome: Progressing   Problem: Safety: Goal: Ability to remain free from injury will improve Outcome: Progressing   Problem: Skin Integrity: Goal: Risk for impaired skin integrity will decrease Outcome: Progressing   Problem: Education: Goal: Knowledge of [place] General Education information/materials will improve Outcome: Progressing Goal: Emotional status will improve Outcome: Progressing Goal: Mental status will improve Outcome: Progressing Goal: Verbalization of understanding the information provided will improve Outcome: Progressing   Problem: Activity: Goal: Interest or engagement in activities will improve Outcome: Progressing Goal: Sleeping patterns will improve Outcome: Progressing   Problem: Coping: Goal: Ability to verbalize frustrations and anger appropriately will improve Outcome: Progressing Goal: Ability to demonstrate self-control will improve Outcome: Progressing   Problem: Health Behavior/Discharge Planning: Goal: Identification of resources available to assist in meeting health care needs will improve Outcome: Progressing Goal: Compliance with treatment plan for underlying cause of condition will improve Outcome: Progressing   Problem: Physical Regulation: Goal: Ability to maintain clinical measurements within normal limits will improve Outcome: Progressing   Problem: Safety: Goal: Periods of time without injury will increase Outcome: Progressing   Problem: Activity: Goal: Will verbalize the importance of balancing activity with adequate rest periods Outcome: Progressing   Problem: Education: Goal: Will be free of psychotic symptoms Outcome: Progressing Goal: Knowledge of the prescribed therapeutic regimen will  improve Outcome: Progressing   Problem: Coping: Goal: Coping ability will improve Outcome: Progressing Goal: Will verbalize feelings Outcome: Progressing   Problem: Health Behavior/Discharge Planning: Goal: Compliance with prescribed medication regimen will improve Outcome: Progressing   Problem: Nutritional: Goal: Ability to achieve adequate nutritional intake will improve Outcome: Progressing   Problem: Role Relationship: Goal: Ability to communicate needs accurately will improve Outcome: Progressing Goal: Ability  to interact with others will improve Outcome: Progressing   Problem: Safety: Goal: Ability to redirect hostility and anger into socially appropriate behaviors will improve Outcome: Progressing Goal: Ability to remain free from injury will improve Outcome: Progressing   Problem: Self-Care: Goal: Ability to participate in self-care as condition permits will improve Outcome: Progressing   Problem: Self-Concept: Goal: Will verbalize positive feelings about self Outcome: Progressing

## 2022-09-08 NOTE — [facility] Group Notes (Signed)
Type of Therapy:  Activity-Recreation  Participation Level:  Active  Participation Quality:  Appropriate  Affect:  Appropriate  Cognitive:  Appropriate  Insight:  Appropriate  Engagement in Group:  Engaged  Modes of Intervention:  Activity  Summary of Progress/Problems: 

## 2022-09-08 NOTE — Progress Notes (Signed)
Patient presents with flat affect. Noted sitting in room most of shift on side of bed. No interaction with peers, minimal with staff. Is very pleasant. Denies Si, Hi, AVH. Medication compliant. Appropriate with staff and peers.  Encouragement and support provided. Safety checks maintained. Medications given as prescribed. Pt receptive and remains safe on unit with q 15 min checks.

## 2022-09-09 NOTE — [facility] Group Notes (Signed)
[facility] Group Notes:  (Nursing/MHT/Case Management/Adjunct)  Date:  09/09/2022  Time:  9:50 AM  Type of Therapy:   community meeting  Participation Level:  Did Not Attend    Travis Sandoval 09/09/2022, 9:50 AM

## 2022-09-09 NOTE — Group Note (Signed)
Date:  09/09/2022 Time:  6:04 PM  Group Topic/Focus:  Outdoor Recreation    Participation Level:  Active  Participation Quality:  Appropriate  Affect:  Appropriate  Cognitive:  Appropriate  Insight: Appropriate  Engagement in Group:  Engaged  Modes of Intervention:  Activity  Additional Comments:    Margert Edsall Travis Dasie Chancellor 09/09/2022, 6:04 PM  

## 2022-09-09 NOTE — Progress Notes (Signed)
[facility] MD Progress Note  09/09/2022 3:06 PM Travis Sandoval  MRN:  [id number] Subjective: Patient seen for follow-up.  No change presentation.  Continues to be withdrawn to his room with no complaints Principal Problem: Undifferentiated schizophrenia (HCC) Diagnosis: Principal Problem:   Undifferentiated schizophrenia (HCC)  Total Time spent with patient: 20 minutes  Past Psychiatric History: See previous  Past Medical History:  Past Medical History:  Diagnosis Date   ADHD    Bipolar 1 disorder (HCC)    Hepatitis C    Schizoaffective disorder (HCC)    History reviewed. No pertinent surgical history. Family History:  Family History  Family history unknown: Yes   Family Psychiatric  History: See previous Social History:  Social History   Substance and Sexual Activity  Alcohol Use Not Currently     Social History   Substance and Sexual Activity  Drug Use Not Currently    Social History   Socioeconomic History   Marital status: Unknown    Spouse name: Not on file   Number of children: Not on file   Years of education: Not on file   Highest education level: Not on file  Occupational History   Not on file  Tobacco Use   Smoking status: Every Day    Packs/day: 0.25    Years: 15.00    Additional pack years: 0.00    Total pack years: 3.75    Types: Cigarettes   Smokeless tobacco: Not on file  Vaping Use   Vaping Use: Not on file  Substance and Sexual Activity   Alcohol use: Not Currently   Drug use: Not Currently   Sexual activity: Not Currently  Other Topics Concern   Not on file  Social History Narrative   Not on file   Social Determinants of Health   Financial Resource Strain: Not on file  Food Insecurity: Patient Declined (05/18/2022)   Hunger Vital Sign    Worried About Running Out of Food in the Last Year: Patient declined    Ran Out of Food in the Last Year: Patient declined  Transportation Needs: Patient Declined (05/18/2022)   PRAPARE -  Administrator, Civil Service (Medical): Patient declined    Lack of Transportation (Non-Medical): Patient declined  Physical Activity: Not on file  Stress: Not on file  Social Connections: Not on file   Additional Social History:                         Sleep: Fair  Appetite:  Fair  Current Medications: Current Facility-Administered Medications  Medication Dose Route Frequency Provider Last Rate Last Admin   acetaminophen (TYLENOL) tablet 650 mg  650 mg Oral Q6H PRN Gabriel Cirri F, NP   650 mg at 08/28/22 2109   alum & mag hydroxide-simeth (MAALOX/MYLANTA) 200-200-20 MG/5ML suspension 30 mL  30 mL Oral Q4H PRN Gabriel Cirri F, NP   30 mL at 09/08/22 2044   ARIPiprazole ER (ABILIFY MAINTENA) injection 400 mg  400 mg Intramuscular Q28 days Halima Fogal T, MD   400 mg at 08/22/22 1615   cloZAPine (CLOZARIL) tablet 300 mg  300 mg Oral QHS Luree Palla T, MD   300 mg at 09/08/22 2044   docusate sodium (COLACE) capsule 200 mg  200 mg Oral BID Chevelle Durr T, MD   200 mg at 07/27/22 0939   famotidine (PEPCID) tablet 20 mg  20 mg Oral BID Elbony Mcclimans, [name], MD   20 mg  at 07/20/22 1740   guaiFENesin (ROBITUSSIN) 100 MG/5ML liquid 5 mL  5 mL Oral Q4H PRN Naomi Fitton T, MD   5 mL at 08/22/22 2110   hydrOXYzine (ATARAX) tablet 50 mg  50 mg Oral Q6H PRN Milina Pagett, [name], MD   50 mg at 08/10/22 2143   magnesium citrate solution 1 Bottle  1 Bottle Oral Daily PRN Jaynie Bream, RPH   1 Bottle at 07/19/22 1643   magnesium hydroxide (MILK OF MAGNESIA) suspension 30 mL  30 mL Oral Daily PRN Jaynie Bream, RPH   30 mL at 08/27/22 2117   mineral oil liquid   Oral Daily PRN Sarina Ill, DO   Given at 07/17/22 1201   nicotine polacrilex (NICORETTE) gum 2 mg  2 mg Oral PRN Maikayla Beggs, [name], MD   2 mg at 04/23/22 2200   polyethylene glycol (MIRALAX / GLYCOLAX) packet 17 g  17 g Oral Daily Higinio Grow, [name], MD   17 g at 09/05/22 1610   senna-docusate (Senokot-S)  tablet 2 tablet  2 tablet Oral Daily PRN Kayveon Lennartz, [name], MD   2 tablet at 07/14/22 0809   ziprasidone (GEODON) injection 20 mg  20 mg Intramuscular Q12H PRN Reggie Pile, MD        Lab Results: No results found for this or any previous visit (from the past 48 hour(s)).  Blood Alcohol level:  Lab Results  Component Value Date   ETH <10 04/15/2022    Metabolic Disorder Labs: Lab Results  Component Value Date   HGBA1C 5.5 04/24/2022   MPG 111 04/24/2022   No results found for: "PROLACTIN" Lab Results  Component Value Date   CHOL 220 (H) 04/24/2022   TRIG 700 (H) 04/24/2022   HDL 35 (L) 04/24/2022   CHOLHDL 6.3 04/24/2022   VLDL UNABLE TO CALCULATE IF TRIGLYCERIDE OVER 400 mg/dL 96/07/5407   LDLCALC UNABLE TO CALCULATE IF TRIGLYCERIDE OVER 400 mg/dL 81/19/1478    Physical Findings: AIMS: Facial and Oral Movements Muscles of Facial Expression: None, normal Lips and Perioral Area: None, normal Jaw: None, normal Tongue: None, normal,Extremity Movements Upper (arms, wrists, hands, fingers): None, normal Lower (legs, knees, ankles, toes): None, normal, Trunk Movements Neck, shoulders, hips: None, normal, Overall Severity Severity of abnormal movements (highest score from questions above): None, normal Incapacitation due to abnormal movements: None, normal Patient's awareness of abnormal movements (rate only patient's report): No Awareness, Dental Status Current problems with teeth and/or dentures?: No Does patient usually wear dentures?: No  CIWA:    COWS:     Musculoskeletal: Strength & Muscle Tone: within normal limits Gait & Station: normal Patient leans: N/A  Psychiatric Specialty Exam:  Presentation  General Appearance:  Disheveled  Eye Contact: Minimal  Speech: Pressured  Speech Volume: Normal  Handedness: Right   Mood and Affect  Mood: Euphoric; Irritable  Affect: Inappropriate   Thought Process  Thought  Processes: Disorganized  Descriptions of Associations:Loose  Orientation:Partial  Thought Content:Illogical; Scattered  History of Schizophrenia/Schizoaffective disorder:No data recorded Duration of Psychotic Symptoms:No data recorded Hallucinations:No data recorded Ideas of Reference:Delusions; Paranoia  Suicidal Thoughts:No data recorded Homicidal Thoughts:No data recorded  Sensorium  Memory: Immediate Poor; Recent Poor; Remote Poor  Judgment: Poor  Insight: Poor   Executive Functions  Concentration: Fair  Attention Span: Fair  Recall: Poor  Fund of Knowledge: Poor  Language: Poor   Psychomotor Activity  Psychomotor Activity:No data recorded  Assets  Assets: Desire for Improvement; Social Support  Sleep  Sleep:No data recorded   Physical Exam: Physical Exam Vitals and nursing note reviewed.  Constitutional:      Appearance: Normal appearance.  HENT:     Head: Normocephalic and atraumatic.     Mouth/Throat:     Pharynx: Oropharynx is clear.  Eyes:     Pupils: Pupils are equal, round, and reactive to light.  Cardiovascular:     Rate and Rhythm: Normal rate and regular rhythm.  Pulmonary:     Effort: Pulmonary effort is normal.     Breath sounds: Normal breath sounds.  Abdominal:     General: Abdomen is flat.     Palpations: Abdomen is soft.  Musculoskeletal:        General: Normal range of motion.  Skin:    General: Skin is warm and dry.  Neurological:     General: No focal deficit present.     Mental Status: He is alert. Mental status is at baseline.  Psychiatric:        Attention and Perception: He is inattentive.        Mood and Affect: Mood normal. Affect is blunt.        Speech: Speech is delayed.        Behavior: Behavior is slowed.        Thought Content: Thought content normal.        Cognition and Memory: Memory is impaired.    Review of Systems  Constitutional: Negative.   HENT: Negative.    Eyes: Negative.    Respiratory: Negative.    Cardiovascular: Negative.   Gastrointestinal: Negative.   Musculoskeletal: Negative.   Skin: Negative.   Neurological: Negative.   Psychiatric/Behavioral: Negative.     Blood pressure 100/75, pulse 88, temperature 98.2 F (36.8 C), temperature source Oral, resp. rate 18, height 5\' 9"  (1.753 m), weight 96.4 kg, SpO2 97 %. Body mass index is 31.38 kg/m.   Treatment Plan Summary: Plan no change to current orders or treatment plan continue to work with team on discharge planning  Mordecai Rasmussen, MD 09/09/2022, 3:06 PM

## 2022-09-09 NOTE — BH IP Treatment Plan (Signed)
Interdisciplinary Treatment and Diagnostic Plan Update  09/09/2022 Time of Session: 08:30 Travis Sandoval MRN: [id number]  Principal Diagnosis: Undifferentiated schizophrenia [facility])  Secondary Diagnoses: Principal Problem:   Undifferentiated schizophrenia (HCC)   Current Medications:  Current Facility-Administered Medications  Medication Dose Route Frequency Provider Last Rate Last Admin   acetaminophen (TYLENOL) tablet 650 mg  650 mg Oral Q6H PRN Gabriel Cirri F, NP   650 mg at 08/28/22 2109   alum & mag hydroxide-simeth (MAALOX/MYLANTA) 200-200-20 MG/5ML suspension 30 mL  30 mL Oral Q4H PRN Gabriel Cirri F, NP   30 mL at 09/08/22 2044   ARIPiprazole ER (ABILIFY MAINTENA) injection 400 mg  400 mg Intramuscular Q28 days [name], [name], MD   400 mg at 08/22/22 1615   cloZAPine (CLOZARIL) tablet 300 mg  300 mg Oral QHS [name], [name], MD   300 mg at 09/08/22 2044   docusate sodium (COLACE) capsule 200 mg  200 mg Oral BID [name], [name], MD   200 mg at 07/27/22 0939   famotidine (PEPCID) tablet 20 mg  20 mg Oral BID [name], [name], MD   20 mg at 07/20/22 1740   guaiFENesin (ROBITUSSIN) 100 MG/5ML liquid 5 mL  5 mL Oral Q4H PRN [name], [name], MD   5 mL at 08/22/22 2110   hydrOXYzine (ATARAX) tablet 50 mg  50 mg Oral Q6H PRN [name], [name], MD   50 mg at 08/10/22 2143   magnesium citrate solution 1 Bottle  1 Bottle Oral Daily PRN Jaynie Bream, RPH   1 Bottle at 07/19/22 1643   magnesium hydroxide (MILK OF MAGNESIA) suspension 30 mL  30 mL Oral Daily PRN Jaynie Bream, RPH   30 mL at 08/27/22 2117   mineral oil liquid   Oral Daily PRN Sarina Ill, DO   Given at 07/17/22 1201   nicotine polacrilex (NICORETTE) gum 2 mg  2 mg Oral PRN [name], [name], MD   2 mg at 04/23/22 2200   polyethylene glycol (MIRALAX / GLYCOLAX) packet 17 g  17 g Oral Daily [name], [name], MD   17 g at 09/05/22 8295   senna-docusate (Senokot-S) tablet 2 tablet  2 tablet Oral Daily PRN  [name], [name], MD   2 tablet at 07/14/22 0809   ziprasidone (GEODON) injection 20 mg  20 mg Intramuscular Q12H PRN Reggie Pile, MD       PTA Medications: Medications Prior to Admission  Medication Sig Dispense Refill Last Dose   ABILIFY MAINTENA 400 MG SRER injection Inject 400 mg into the muscle every 28 (twenty-eight) days.      ARIPiprazole (ABILIFY) 10 MG tablet Take 10 mg by mouth daily. (Patient not taking: Reported on 04/16/2022)      atomoxetine (STRATTERA) 40 MG capsule Take 40 mg by mouth daily. (Patient not taking: Reported on 04/16/2022)      atorvastatin (LIPITOR) 40 MG tablet Take 40 mg by mouth daily.      budesonide-formoterol (SYMBICORT) 160-4.5 MCG/ACT inhaler Inhale 2 puffs into the lungs.      cetirizine (ZYRTEC) 10 MG tablet Take 10 mg by mouth daily.      clozapine (CLOZARIL) 200 MG tablet Take 200 mg by mouth 2 (two) times daily. Take along with one 25 mg tablet for total 225 mg twice daily      cloZAPine (CLOZARIL) 25 MG tablet Take 25 mg by mouth 2 (two) times daily. Take along with one 200 mg tablet for total  225 mg twice daily      Dexlansoprazole (DEXILANT) 30 MG capsule Take 30 mg by mouth daily. (Patient not taking: Reported on 04/16/2022)      hydrOXYzine (VISTARIL) 50 MG capsule Take 50 mg by mouth 3 (three) times daily as needed for itching. (Patient not taking: Reported on 04/16/2022)      Melatonin 5 MG TABS Take 5 mg by mouth at bedtime as needed (sleep).      metoprolol succinate (TOPROL-XL) 25 MG 24 hr tablet Take 12.5 mg by mouth daily.      Phosphatidyl Choline 65 MG TABS Take 1 tablet by mouth daily.  (Patient not taking: Reported on 04/16/2022)      valproic acid (DEPAKENE) 250 MG/5ML solution Take 750-1,000 mLs by mouth 2 (two) times daily. 1000 mg (20 mL) every morning and 750 mg (15 mL) daily at bedtime       Patient Stressors: Other: Psychosis    Patient Strengths: Ability for insight  Active sense of humor  Average or above average  intelligence  Capable of independent living  Supportive family/friends   Treatment Modalities: Medication Management, Group therapy, Case management,  1 to 1 session with clinician, Psychoeducation, Recreational therapy.   Physician Treatment Plan for Primary Diagnosis: Undifferentiated schizophrenia (HCC) Long Term Goal(s):     Short Term Goals: None, pt is a poor hx.  Medication Management: Evaluate patient's response, side effects, and tolerance of medication regimen.  Therapeutic Interventions: 1 to 1 sessions, Unit Group sessions and Medication administration.  Evaluation of Outcomes: Adequate for Discharge  Physician Treatment Plan for Secondary Diagnosis: Principal Problem:   Undifferentiated schizophrenia (HCC)  Long Term Goal(s):     Short Term Goals: None, pt is a poor hx.     Medication Management: Evaluate patient's response, side effects, and tolerance of medication regimen.  Therapeutic Interventions: 1 to 1 sessions, Unit Group sessions and Medication administration.  Evaluation of Outcomes: Adequate for Discharge   RN Treatment Plan for Primary Diagnosis: Undifferentiated schizophrenia (HCC) Long Term Goal(s): Knowledge of disease and therapeutic regimen to maintain health will improve  Short Term Goals: Ability to remain free from injury will improve, Ability to verbalize frustration and anger appropriately will improve, Ability to demonstrate self-control, Ability to participate in decision making will improve, Ability to verbalize feelings will improve, Ability to disclose and discuss suicidal ideas, Ability to identify and develop effective coping behaviors will improve, and Compliance with prescribed medications will improve  Medication Management: RN will administer medications as ordered by provider, will assess and evaluate patient's response and provide education to patient for prescribed medication. RN will report any adverse and/or side effects to  prescribing provider.  Therapeutic Interventions: 1 on 1 counseling sessions, Psychoeducation, Medication administration, Evaluate responses to treatment, Monitor vital signs and CBGs as ordered, Perform/monitor CIWA, COWS, AIMS and Fall Risk screenings as ordered, Perform wound care treatments as ordered.  Evaluation of Outcomes: Adequate for Discharge   LCSW Treatment Plan for Primary Diagnosis: Undifferentiated schizophrenia (HCC) Long Term Goal(s): Safe transition to appropriate next level of care at discharge, Engage patient in therapeutic group addressing interpersonal concerns.  Short Term Goals: Engage patient in aftercare planning with referrals and resources, Increase social support, Increase ability to appropriately verbalize feelings, Increase emotional regulation, Facilitate acceptance of mental health diagnosis and concerns, and Increase skills for wellness and recovery  Therapeutic Interventions: Assess for all discharge needs, 1 to 1 time with Social worker, Explore available resources and support systems, Assess for  adequacy in community support network, Educate family and significant other(s) on suicide prevention, Complete Psychosocial Assessment, Interpersonal group therapy.  Evaluation of Outcomes: Adequate for Discharge   Progress in Treatment: Attending groups: No. Participating in groups: No. Taking medication as prescribed: Yes. Toleration medication: Yes. Family/Significant other contact made: Yes, individual(s) contacted:  father, Elier Sebastiani.  Patient understands diagnosis: No. Discussing patient identified problems/goals with staff: Yes. Medical problems stabilized or resolved: Yes. Denies suicidal/homicidal ideation: Yes. Issues/concerns per patient self-inventory: No. Other: none.   New problem(s) identified: No, Describe:  none Update 06/30/2021:  No changes at this time. Update 07/06/2022:  No changes at this time. Update 07/11/22: No changes at this time.  Update 07/16/22: none. Update 07/21/22: none. Update 07/26/22: No changes at this time.  Update 07/31/2022:  No changes at this time.  Update 08/05/2022: No change at this time. Update 08/10/22: No changes at this time. Update 08/15/22: No changes at this time. Update 08/20/22: none. Update 08/25/22: none. Update 08/30/22: No changes at this time. Update 09/04/22: No changes at this time. Update 09/09/22: No changes at this time.   New Short Term/Long Term Goal(s): elimination of symptoms of psychosis, medication management for mood stabilization; elimination of SI thoughts; development of comprehensive mental wellness plan. Update 06/30/2021:  No changes at this time. Update 07/06/2022:  No changes at this time. Update 07/11/22: No changes at this time. Update 07/16/22: Patient to work towards elimination of symptoms of psychosis, medication management for mood stabilization; development of comprehensive mental wellness plan. Update 07/21/22: Patient to work towards elimination of symptoms of psychosis, medication management for mood stabilization; development of comprehensive mental wellness plan. Update 07/26/22: No changes at this time.  Update 07/31/2022:  No changes at this time.  Update 08/05/2022: No change at this time. Update 08/10/22: No changes at this time. Update 08/15/22: No changes at this time. Update 08/20/22:  Patient to work towards elimination of symptoms of psychosis, medication management for mood stabilization; development of comprehensive mental wellness plan. Update 08/25/22: no additional goals identified at this time. Update 08/30/22: No changes at this time. Update 09/04/22: No changes at this time. Update 09/09/22: No changes at this time.   Patient Goals:  No additional treatment goals identified by patient. Update 06/30/2021:  No changes at this time. Update 07/06/2022:  No changes at this time. Update 07/11/22: No changes at this time. Update 07/16/22: No additional goals identified at this time. Patient to  continue to work towards original goals identified in initial treatment team meeting. CSW will remain available to patient should they voice additional treatment goals. Update 07/21/22: No additional goals identified at this time. Patient to continue to work towards original goals identified in initial treatment team meeting. CSW will remain available to patient should they voice additional treatment goals. Update 07/26/22: No changes at this time.  Update 07/31/2022:  No changes at this time. Update 08/05/2022: No change at this time. Update 08/10/22: No changes at this time. Update 08/15/22: No changes at this time. Update 08/20/22: No additional goals identified at this time. Patient to continue to work towards original goals identified in initial treatment team meeting. CSW will remain available to patient should they voice additional treatment goals. Update 08/25/22: no additional goals identified at this time. Update 08/30/22: No changes at this time. Update 09/04/22: No changes at this time. Update 09/09/22: No changes at this time.   Discharge Plan or Barriers: Patient remains on the unit as placement continues to be sought.  Funding remains the concern at this time and biggest barrier to placement at this time. Update 06/30/2021:  No changes at this time. Update 07/06/2022:  No changes at this time. Update 07/11/22: No changes at this time. Update 07/16/22: Patient lacks funding for placement, [employer] rep continues to communicate with social security to restart payment. Patient has guardianship hearing on this day. Situation ongoing, CSW will continue to monitor and update note as more information becomes available. Update 07/21/22: Patient lacks adequate funding, CSW to continue pursing payor source to secure placement. Patient is unable to living independently per psychiatrist. Update 07/26/22: No changes at this time. Update 07/31/2022:  No changes at this time.  Update 08/05/2022: No change at this time. Update 08/10/22: No  changes at this time. Update 08/15/22: No changes at this time. Update 08/20/22: Patient lack funding to procure group home placement. Per note on 4/29, "CSW reached [facility] DSS regarding update on payee application. [name], DSS Guardian Supervisor, at [phone] reports the patient has been awarded [employer], although the social security office has not yet awarded an amount. CSW clarified if DSS has been appointed as the patient's payee at this time. [name] reports they have not been appointed as the payee yet. CSW encouraged DSS to apply or have someone appointed payee as soon as possible in order to avoid any delays once social security has determined an amount." Update 08/25/22: Patient is waiting on insurance. Update 08/30/22: Pt has an interview for potential placement option today at 10:30. CSW to assist with virtual interview. Update 09/04/22: Placement search continues. Pt participated in an interview yesterday and team is awaiting determination. Previous interview which had been arranged for 08/30/22 fell through as facility staff member did not enter the call. Update 09/09/22: Pt took place in an interview last week and information was sent to [employer]. Awaiting to hear about decision from interview and will contact to follow up regarding information sent over to [facility].    Reason for Continuation of Hospitalization: Pt is boarding.   Estimated Length of Stay: TBD Update 07/06/2022:  TBD Update 07/11/22: No changes at this time. Update 07/16/22: TBD. Update 07/21/22: TBD. Update 07/26/22: No changes at this time.  Update 07/31/2022:  No changes at this time.  Update 08/05/2022: No change at this time. Update 08/10/22: No changes at this time. Update 08/15/22: No changes at this time. Update 08/20/22: TBD. Update 08/25/22: TBD. Update 08/30/22: No changes at this time. Update 09/04/22: No changes at this time. Update 09/09/22: No changes at this time.    Last 3 [place] Suicide  Severity Risk Score: Flowsheet Row Admission (Current) from 04/16/2022 in [facility] INPATIENT BEH[facility]AL MEDICINE ED from 04/15/2022 in [facility] Emergency Department at [facility]  C-SSRS RISK CATEGORY No Risk No Risk       Last PHQ 2/9 Scores:     No data to display          Scribe for Treatment Team: Glenis Smoker, LCSW 09/09/2022 11:06 AM

## 2022-09-09 NOTE — Plan of Care (Signed)
Patient  refused to take scheduled medicines Colace,pepcid and Miralax. Stated that he does not need today. Patient stated that he slept good and he is doing "alright." No issues verbalized. Appetite and energy level good. Support and encouragement given.

## 2022-09-09 NOTE — Group Note (Signed)
Recreation Therapy Group Note   Group Topic:Goal Setting  Group Date: 09/09/2022 Start Time: 1000 End Time: 1100 Facilitators: Rosina Lowenstein, LRT, CTRS Location:  Craft Room  Group Description: Vision Board. Patients were given many different magazines, a glue stick, markers, and a piece of cardstock paper. LRT and pts discussed the importance of having goals in life. LRT and pts discussed the difference between short-term and long-term goals, as well as what a SMART goal is. LRT encouraged pts to create a vision board, with images they picked and then cut out with safety scissors from the magazine, for themselves, that capture their short and long-term goals. LRT encouraged pts to show and explain their vision board to the group. LRT offered to laminate vision board once dry and complete.   Goal Area(s) Addressed:  Patient will gain knowledge of short vs. long term goals.  Patient will identify goals for themselves. Patient will practice setting SMART goals. Patient will verbalize their goals to LRT and peers.  Affect/Mood: N/A   Participation Level: Did not attend    Clinical Observations/Individualized Feedback: Travis Sandoval did not attend group due to resting in his room.  Plan: Continue to engage patient in RT group sessions 2-3x/week.   Rosina Lowenstein, LRT, CTRS 09/09/2022 11:23 AM

## 2022-09-10 NOTE — Group Note (Signed)
LCSW Group Therapy Note  Group Date: 09/10/2022 Start Time: 1300 End Time: 1400   Type of Therapy and Topic:  Group Therapy - How To Cope with Nervousness about Discharge   Participation Level:  Did Not Attend   Description of Group This process group involved identification of patients' feelings about discharge. Some of them are scheduled to be discharged soon, while others are new admissions, but each of them was asked to share thoughts and feelings surrounding discharge from the hospital. One common theme was that they are excited at the prospect of going home, while another was that many of them are apprehensive about sharing why they were hospitalized. Patients were given the opportunity to discuss these feelings with their peers in preparation for discharge.  Therapeutic Goals  Patient will identify their overall feelings about pending discharge. Patient will think about how they might proactively address issues that they believe will once again arise once they get home (i.e. with parents). Patients will participate in discussion about having hope for change.   Summary of Patient Progress:   Patient did not attend group despite encouraged participation.    Therapeutic Modalities Cognitive Behavioral Therapy   Granvil Djordjevic W Shelley Pooley, LCSWA 09/10/2022  2:21 PM   

## 2022-09-10 NOTE — Plan of Care (Signed)
  Problem: Coping: Goal: Level of anxiety will decrease Outcome: Progressing   Problem: Safety: Goal: Ability to remain free from injury will improve Outcome: Progressing   Problem: Education: Goal: Emotional status will improve Outcome: Progressing Goal: Mental status will improve Outcome: Progressing   

## 2022-09-10 NOTE — Group Note (Signed)
Recreation Therapy Group Note   Group Topic:Team Building  Group Date: 09/10/2022 Start Time: 1000 End Time: 1050 Facilitators: Rosina Lowenstein, LRT, CTRS Location:  Craft Room  Group Description: Life Boat. Patients were given the scenario that they are on a boat that is about to become shipwrecked, leaving them stranded on an [place]. They are asked to make a list of 15 different items that they want to take with them when they are stranded on the [place]. Patients are asked to rank their items from most important to least important, #1 being the most important and #15 being the least. Patients will work individually for the first round to come up with 15 items and then pair up with a peer(s) to condense their list and come up with one list of 15 items between the two of them. Patients or LRT will read aloud the 15 different items to the group after each round. LRT facilitated post-activity processing to discuss how this activity can be used in daily life post discharge.   Goal Area(s) Addressed:  Patient will identify priorities, wants and needs. Patient will communicate with LRT and peers. Patient will work collectively as a Administrator, Civil Service. Patient will work on Product manager.   Affect/Mood: N/A   Participation Level: Did not attend    Clinical Observations/Individualized Feedback: Travis Sandoval did not attend group due to resting in his room.  Plan: Continue to engage patient in RT group sessions 2-3x/week.   Rosina Lowenstein, LRT, CTRS 09/10/2022 11:42 AM

## 2022-09-10 NOTE — Progress Notes (Signed)
D- Patient alert and oriented x3-4. Affect flat/mood flat and preoccupied. Denies SI/ HI/ AVH. Patient denies pain. He denies depression and anxiety. Noted AVH- seen talking to himself in his room. He refused daily self inventory sheet today. A- Scheduled medications administered to patient, per MD orders except Colace and Pepcid- refuses. Support and encouragement provided.  Routine safety checks conducted every 15 minutes without incident. Patient informed to notify staff with problems or concerns and verbalizes understanding. R- No adverse drug reactions noted.  Patient Clozaril therapy and treatment plan. Patient calm and cooperative. He is isolative to room except for meals but attended outdoor group today. Patient contracts for safety and  remains safe on the unit at this time.

## 2022-09-10 NOTE — Progress Notes (Signed)
Patient calm and pleasant during assessment denying SI/HI/AVH. Pt compliant with medication administration per MD orders. Pt given education, support, and encouragement to be active in his treatment plan. Pt being monitored Q 15 minutes for safety per unit protocol, remains safe on the unit  

## 2022-09-10 NOTE — Progress Notes (Signed)
[facility] MD Progress Note  09/10/2022 10:56 AM Travis Sandoval  MRN:  [id number] Subjective: Follow-up patient with schizophrenia.  As usual he is in his room withdrawn and not doing much but wakes up and communicates with me.  Denies any complaints.  No aggressive or dangerous behavior.  Medicine compliant Principal Problem: Undifferentiated schizophrenia (HCC) Diagnosis: Principal Problem:   Undifferentiated schizophrenia (HCC)  Total Time spent with patient: 30 minutes  Past Psychiatric History: Past history of schizophrenia  Past Medical History:  Past Medical History:  Diagnosis Date   ADHD    Bipolar 1 disorder (HCC)    Hepatitis C    Schizoaffective disorder (HCC)    History reviewed. No pertinent surgical history. Family History:  Family History  Family history unknown: Yes   Family Psychiatric  History: See previous Social History:  Social History   Substance and Sexual Activity  Alcohol Use Not Currently     Social History   Substance and Sexual Activity  Drug Use Not Currently    Social History   Socioeconomic History   Marital status: Unknown    Spouse name: Not on file   Number of children: Not on file   Years of education: Not on file   Highest education level: Not on file  Occupational History   Not on file  Tobacco Use   Smoking status: Every Day    Packs/day: 0.25    Years: 15.00    Additional pack years: 0.00    Total pack years: 3.75    Types: Cigarettes   Smokeless tobacco: Not on file  Vaping Use   Vaping Use: Not on file  Substance and Sexual Activity   Alcohol use: Not Currently   Drug use: Not Currently   Sexual activity: Not Currently  Other Topics Concern   Not on file  Social History Narrative   Not on file   Social Determinants of Health   Financial Resource Strain: Not on file  Food Insecurity: Patient Declined (05/18/2022)   Hunger Vital Sign    Worried About Running Out of Food in the Last Year: Patient declined    Ran Out  of Food in the Last Year: Patient declined  Transportation Needs: Patient Declined (05/18/2022)   PRAPARE - Administrator, Civil Service (Medical): Patient declined    Lack of Transportation (Non-Medical): Patient declined  Physical Activity: Not on file  Stress: Not on file  Social Connections: Not on file   Additional Social History:                         Sleep: Fair  Appetite:  Fair  Current Medications: Current Facility-Administered Medications  Medication Dose Route Frequency Provider Last Rate Last Admin   acetaminophen (TYLENOL) tablet 650 mg  650 mg Oral Q6H PRN Gabriel Cirri F, NP   650 mg at 08/28/22 2109   alum & mag hydroxide-simeth (MAALOX/MYLANTA) 200-200-20 MG/5ML suspension 30 mL  30 mL Oral Q4H PRN Gabriel Cirri F, NP   30 mL at 09/09/22 2051   ARIPiprazole ER (ABILIFY MAINTENA) injection 400 mg  400 mg Intramuscular Q28 days Zakhai Meisinger T, MD   400 mg at 08/22/22 1615   cloZAPine (CLOZARIL) tablet 300 mg  300 mg Oral QHS Adisyn Ruscitti T, MD   300 mg at 09/09/22 2051   docusate sodium (COLACE) capsule 200 mg  200 mg Oral BID Camilah Spillman, [name], MD   200 mg at 07/27/22 [phone]  famotidine (PEPCID) tablet 20 mg  20 mg Oral BID Zacharia Sowles T, MD   20 mg at 07/20/22 1740   guaiFENesin (ROBITUSSIN) 100 MG/5ML liquid 5 mL  5 mL Oral Q4H PRN Marisela Line T, MD   5 mL at 08/22/22 2110   hydrOXYzine (ATARAX) tablet 50 mg  50 mg Oral Q6H PRN Takahiro Godinho T, MD   50 mg at 08/10/22 2143   magnesium citrate solution 1 Bottle  1 Bottle Oral Daily PRN Jaynie Bream, RPH   1 Bottle at 07/19/22 1643   magnesium hydroxide (MILK OF MAGNESIA) suspension 30 mL  30 mL Oral Daily PRN Jaynie Bream, RPH   30 mL at 08/27/22 2117   mineral oil liquid   Oral Daily PRN Sarina Ill, DO   Given at 07/17/22 1201   nicotine polacrilex (NICORETTE) gum 2 mg  2 mg Oral PRN Sophia Cubero, [name], MD   2 mg at 04/23/22 2200   polyethylene glycol (MIRALAX /  GLYCOLAX) packet 17 g  17 g Oral Daily Virgal Warmuth, [name], MD   17 g at 09/10/22 1610   senna-docusate (Senokot-S) tablet 2 tablet  2 tablet Oral Daily PRN Thomas Rhude, [name], MD   2 tablet at 07/14/22 0809   ziprasidone (GEODON) injection 20 mg  20 mg Intramuscular Q12H PRN Reggie Pile, MD        Lab Results: No results found for this or any previous visit (from the past 48 hour(s)).  Blood Alcohol level:  Lab Results  Component Value Date   ETH <10 04/15/2022    Metabolic Disorder Labs: Lab Results  Component Value Date   HGBA1C 5.5 04/24/2022   MPG 111 04/24/2022   No results found for: "PROLACTIN" Lab Results  Component Value Date   CHOL 220 (H) 04/24/2022   TRIG 700 (H) 04/24/2022   HDL 35 (L) 04/24/2022   CHOLHDL 6.3 04/24/2022   VLDL UNABLE TO CALCULATE IF TRIGLYCERIDE OVER 400 mg/dL 96/07/5407   LDLCALC UNABLE TO CALCULATE IF TRIGLYCERIDE OVER 400 mg/dL 81/19/1478    Physical Findings: AIMS: Facial and Oral Movements Muscles of Facial Expression: None, normal Lips and Perioral Area: None, normal Jaw: None, normal Tongue: None, normal,Extremity Movements Upper (arms, wrists, hands, fingers): None, normal Lower (legs, knees, ankles, toes): None, normal, Trunk Movements Neck, shoulders, hips: None, normal, Overall Severity Severity of abnormal movements (highest score from questions above): None, normal Incapacitation due to abnormal movements: None, normal Patient's awareness of abnormal movements (rate only patient's report): No Awareness, Dental Status Current problems with teeth and/or dentures?: No Does patient usually wear dentures?: No  CIWA:    COWS:     Musculoskeletal: Strength & Muscle Tone: within normal limits Gait & Station: normal Patient leans: N/A  Psychiatric Specialty Exam:  Presentation  General Appearance:  Disheveled  Eye Contact: Minimal  Speech: Pressured  Speech Volume: Normal  Handedness: Right   Mood and Affect   Mood: Euphoric; Irritable  Affect: Inappropriate   Thought Process  Thought Processes: Disorganized  Descriptions of Associations:Loose  Orientation:Partial  Thought Content:Illogical; Scattered  History of Schizophrenia/Schizoaffective disorder:No data recorded Duration of Psychotic Symptoms:No data recorded Hallucinations:No data recorded Ideas of Reference:Delusions; Paranoia  Suicidal Thoughts:No data recorded Homicidal Thoughts:No data recorded  Sensorium  Memory: Immediate Poor; Recent Poor; Remote Poor  Judgment: Poor  Insight: Poor   Executive Functions  Concentration: Fair  Attention Span: Fair  Recall: Poor  Fund of Knowledge: Poor  Language: Poor  Psychomotor Activity  Psychomotor Activity:No data recorded  Assets  Assets: Desire for Improvement; Social Support   Sleep  Sleep:No data recorded   Physical Exam: Physical Exam Vitals reviewed.  Constitutional:      Appearance: Normal appearance.  HENT:     Head: Normocephalic and atraumatic.     Mouth/Throat:     Pharynx: Oropharynx is clear.  Eyes:     Pupils: Pupils are equal, round, and reactive to light.  Cardiovascular:     Rate and Rhythm: Normal rate and regular rhythm.  Pulmonary:     Effort: Pulmonary effort is normal.     Breath sounds: Normal breath sounds.  Abdominal:     General: Abdomen is flat.     Palpations: Abdomen is soft.  Musculoskeletal:        General: Normal range of motion.  Skin:    General: Skin is warm and dry.  Neurological:     General: No focal deficit present.     Mental Status: He is alert. Mental status is at baseline.  Psychiatric:        Attention and Perception: Attention normal.        Mood and Affect: Mood normal. Affect is blunt.        Speech: Speech is delayed.        Behavior: Behavior is withdrawn.        Thought Content: Thought content normal.    Review of Systems  Constitutional: Negative.   HENT: Negative.     Eyes: Negative.   Respiratory: Negative.    Cardiovascular: Negative.   Gastrointestinal: Negative.   Musculoskeletal: Negative.   Skin: Negative.   Neurological: Negative.   Psychiatric/Behavioral: Negative.     Blood pressure 108/77, pulse (!) 125, temperature 97.9 F (36.6 C), temperature source Oral, resp. rate 18, height 5\' 9"  (1.753 m), weight 96.4 kg, SpO2 98 %. Body mass index is 31.38 kg/m.   Treatment Plan Summary: Plan stable with residual psychotic symptoms but no active behavior problems.  No change to medicine.  Spoke with treatment team and representative from the local authority today.  Plans are still in place for trying to find a place for him to stay.  Mordecai Rasmussen, MD 09/10/2022, 10:56 AM

## 2022-09-10 NOTE — Group Note (Signed)
Date:  09/10/2022 Time:  2:51 PM  Group Topic/Focus:  Community Meeting    Participation Level:  Did Not Attend   Mischa Brittingham Travis Avon Mergenthaler 09/10/2022, 2:51 PM  

## 2022-09-10 NOTE — Group Note (Signed)
Date:  09/10/2022 Time:  4:21 PM  Group Topic/Focus:  Music therapy/outside time     Participation Level:  Minimal  Participation Quality:  Appropriate  Affect:  Appropriate  Cognitive:  Alert and Appropriate  Insight: Appropriate  Engagement in Group:  Engaged  Modes of Intervention:  Activity  Additional Comments:    Doug Sou 09/10/2022, 4:21 PM

## 2022-09-10 NOTE — Progress Notes (Signed)
Patient pleasant and cooperative. No complaints voiced. Denies SI, HI, AVH. Prn given for anxiety. Encouragement and support provided. Safety checks maintained. Medications given as prescribed. Pt remains safe on unit with q 15 min checks.

## 2022-09-10 NOTE — Plan of Care (Signed)
  Problem: Education: Goal: Knowledge of General Education information will improve Description: Including pain rating scale, medication(s)/side effects and non-pharmacologic comfort measures Outcome: Progressing   Problem: Health Behavior/Discharge Planning: Goal: Ability to manage health-related needs will improve Outcome: Progressing   Problem: Clinical Measurements: Goal: Ability to maintain clinical measurements within normal limits will improve Outcome: Progressing Goal: Will remain free from infection Outcome: Progressing Goal: Diagnostic test results will improve Outcome: Progressing Goal: Respiratory complications will improve Outcome: Progressing Goal: Cardiovascular complication will be avoided Outcome: Progressing   Problem: Activity: Goal: Risk for activity intolerance will decrease Outcome: Progressing   Problem: Nutrition: Goal: Adequate nutrition will be maintained Outcome: Progressing   Problem: Coping: Goal: Level of anxiety will decrease Outcome: Progressing   Problem: Elimination: Goal: Will not experience complications related to urinary retention Outcome: Progressing   Problem: Pain Managment: Goal: General experience of comfort will improve Outcome: Progressing   Problem: Safety: Goal: Ability to remain free from injury will improve Outcome: Progressing   Problem: Skin Integrity: Goal: Risk for impaired skin integrity will decrease Outcome: Progressing   Problem: Education: Goal: Knowledge of [facility] Education information/materials will improve Outcome: Progressing Goal: Emotional status will improve Outcome: Progressing Goal: Mental status will improve Outcome: Progressing Goal: Verbalization of understanding the information provided will improve Outcome: Progressing   Problem: Activity: Goal: Interest or engagement in activities will improve Outcome: Progressing Goal: Sleeping patterns will improve Outcome:  Progressing   Problem: Coping: Goal: Ability to verbalize frustrations and anger appropriately will improve Outcome: Progressing Goal: Ability to demonstrate self-control will improve Outcome: Progressing   Problem: Health Behavior/Discharge Planning: Goal: Identification of resources available to assist in meeting health care needs will improve Outcome: Progressing Goal: Compliance with treatment plan for underlying cause of condition will improve Outcome: Progressing   Problem: Physical Regulation: Goal: Ability to maintain clinical measurements within normal limits will improve Outcome: Progressing   Problem: Safety: Goal: Periods of time without injury will increase Outcome: Progressing   Problem: Activity: Goal: Will verbalize the importance of balancing activity with adequate rest periods Outcome: Progressing   Problem: Education: Goal: Will be free of psychotic symptoms Outcome: Progressing Goal: Knowledge of the prescribed therapeutic regimen will improve Outcome: Progressing   Problem: Coping: Goal: Coping ability will improve Outcome: Progressing Goal: Will verbalize feelings Outcome: Progressing   Problem: Health Behavior/Discharge Planning: Goal: Compliance with prescribed medication regimen will improve Outcome: Progressing   Problem: Nutritional: Goal: Ability to achieve adequate nutritional intake will improve Outcome: Progressing   Problem: Role Relationship: Goal: Ability to communicate needs accurately will improve Outcome: Progressing Goal: Ability to interact with others will improve Outcome: Progressing   Problem: Safety: Goal: Ability to redirect hostility and anger into socially appropriate behaviors will improve Outcome: Progressing Goal: Ability to remain free from injury will improve Outcome: Progressing   Problem: Self-Care: Goal: Ability to participate in self-care as condition permits will improve Outcome: Progressing    Problem: Self-Concept: Goal: Will verbalize positive feelings about self Outcome: Progressing   

## 2022-09-11 LAB — CBC WITH DIFFERENTIAL/PLATELET
Abs Immature Granulocytes: 0.03 10*3/uL (ref 0.00–0.07)
Basophils Absolute: 0.1 10*3/uL (ref 0.0–0.1)
Basophils Relative: 1 %
Eosinophils Absolute: 0.2 10*3/uL (ref 0.0–0.5)
Eosinophils Relative: 3 %
HCT: 43.4 % (ref 39.0–52.0)
Hemoglobin: 14.4 g/dL (ref 13.0–17.0)
Immature Granulocytes: 1 %
Lymphocytes Relative: 42 %
Lymphs Abs: 2.7 10*3/uL (ref 0.7–4.0)
MCH: 28.3 pg (ref 26.0–34.0)
MCHC: 33.2 g/dL (ref 30.0–36.0)
MCV: 85.4 fL (ref 80.0–100.0)
Monocytes Absolute: 0.4 10*3/uL (ref 0.1–1.0)
Monocytes Relative: 6 %
Neutro Abs: 3 10*3/uL (ref 1.7–7.7)
Neutrophils Relative %: 47 %
Platelets: 165 10*3/uL (ref 150–400)
RBC: 5.08 MIL/uL (ref 4.22–5.81)
RDW: 13.9 % (ref 11.5–15.5)
WBC: 6.4 10*3/uL (ref 4.0–10.5)
nRBC: 0 % (ref 0.0–0.2)

## 2022-09-11 NOTE — Progress Notes (Signed)
[facility] MD Progress Note  09/11/2022 10:43 AM Travis Sandoval  MRN:  [id number] Subjective: Patient seen.  No change to presentation.  Mostly stays withdrawn but he is appropriately conversant when I speak with him.  Tells me that he is really hoping they will find him a place to stay in [place].  Also that he feels he needs a place that will "keep me safe".  When I asked him exactly what he needed to be Safe from he did not have a real answer to it.  No behavior issues. Principal Problem: Undifferentiated schizophrenia (HCC) Diagnosis: Principal Problem:   Undifferentiated schizophrenia (HCC)  Total Time spent with patient: 30 minutes  Past Psychiatric History: Longstanding schizophrenia  Past Medical History:  Past Medical History:  Diagnosis Date   ADHD    Bipolar 1 disorder (HCC)    Hepatitis C    Schizoaffective disorder (HCC)    History reviewed. No pertinent surgical history. Family History:  Family History  Family history unknown: Yes   Family Psychiatric  History: See previous Social History:  Social History   Substance and Sexual Activity  Alcohol Use Not Currently     Social History   Substance and Sexual Activity  Drug Use Not Currently    Social History   Socioeconomic History   Marital status: Unknown    Spouse name: Not on file   Number of children: Not on file   Years of education: Not on file   Highest education level: Not on file  Occupational History   Not on file  Tobacco Use   Smoking status: Every Day    Packs/day: 0.25    Years: 15.00    Additional pack years: 0.00    Total pack years: 3.75    Types: Cigarettes   Smokeless tobacco: Not on file  Vaping Use   Vaping Use: Not on file  Substance and Sexual Activity   Alcohol use: Not Currently   Drug use: Not Currently   Sexual activity: Not Currently  Other Topics Concern   Not on file  Social History Narrative   Not on file   Social Determinants of Health   Financial Resource  Strain: Not on file  Food Insecurity: Patient Declined (05/18/2022)   Hunger Vital Sign    Worried About Running Out of Food in the Last Year: Patient declined    Ran Out of Food in the Last Year: Patient declined  Transportation Needs: Patient Declined (05/18/2022)   PRAPARE - Administrator, Civil Service (Medical): Patient declined    Lack of Transportation (Non-Medical): Patient declined  Physical Activity: Not on file  Stress: Not on file  Social Connections: Not on file   Additional Social History:                         Sleep: Fair  Appetite:  Fair  Current Medications: Current Facility-Administered Medications  Medication Dose Route Frequency Provider Last Rate Last Admin   acetaminophen (TYLENOL) tablet 650 mg  650 mg Oral Q6H PRN Gabriel Cirri F, NP   650 mg at 08/28/22 2109   alum & mag hydroxide-simeth (MAALOX/MYLANTA) 200-200-20 MG/5ML suspension 30 mL  30 mL Oral Q4H PRN Gabriel Cirri F, NP   30 mL at 09/10/22 2045   ARIPiprazole ER (ABILIFY MAINTENA) injection 400 mg  400 mg Intramuscular Q28 days Madalyn Legner T, MD   400 mg at 08/22/22 1615   cloZAPine (CLOZARIL) tablet 300 mg  300 mg Oral QHS Jackeline Gutknecht T, MD   300 mg at 09/10/22 2045   docusate sodium (COLACE) capsule 200 mg  200 mg Oral BID Neriyah Cercone, [name], MD   200 mg at 07/27/22 0939   famotidine (PEPCID) tablet 20 mg  20 mg Oral BID Mickelle Goupil, [name], MD   20 mg at 07/20/22 1740   guaiFENesin (ROBITUSSIN) 100 MG/5ML liquid 5 mL  5 mL Oral Q4H PRN Jahnia Hewes, [name], MD   5 mL at 08/22/22 2110   hydrOXYzine (ATARAX) tablet 50 mg  50 mg Oral Q6H PRN Shunte Senseney, [name], MD   50 mg at 08/10/22 2143   magnesium citrate solution 1 Bottle  1 Bottle Oral Daily PRN Jaynie Bream, RPH   1 Bottle at 07/19/22 1643   magnesium hydroxide (MILK OF MAGNESIA) suspension 30 mL  30 mL Oral Daily PRN Jaynie Bream, RPH   30 mL at 08/27/22 2117   mineral oil liquid   Oral Daily PRN Sarina Ill, DO   Given at 07/17/22 1201   nicotine polacrilex (NICORETTE) gum 2 mg  2 mg Oral PRN Abrahim Sargent, [name], MD   2 mg at 04/23/22 2200   polyethylene glycol (MIRALAX / GLYCOLAX) packet 17 g  17 g Oral Daily Darilyn Storbeck, [name], MD   17 g at 09/11/22 1030   senna-docusate (Senokot-S) tablet 2 tablet  2 tablet Oral Daily PRN Kyannah Climer, [name], MD   2 tablet at 07/14/22 0809   ziprasidone (GEODON) injection 20 mg  20 mg Intramuscular Q12H PRN Reggie Pile, MD        Lab Results:  Results for orders placed or performed during the hospital encounter of 04/16/22 (from the past 48 hour(s))  CBC with Differential/Platelet     Status: None   Collection Time: 09/11/22  9:12 AM  Result Value Ref Range   WBC 6.4 4.0 - 10.5 K/uL   RBC 5.08 4.22 - 5.81 MIL/uL   Hemoglobin 14.4 13.0 - 17.0 g/dL   HCT 16.1 09.6 - 04.5 %   MCV 85.4 80.0 - 100.0 fL   MCH 28.3 26.0 - 34.0 pg   MCHC 33.2 30.0 - 36.0 g/dL   RDW 40.9 81.1 - 91.4 %   Platelets 165 150 - 400 K/uL   nRBC 0.0 0.0 - 0.2 %   Neutrophils Relative % 47 %   Neutro Abs 3.0 1.7 - 7.7 K/uL   Lymphocytes Relative 42 %   Lymphs Abs 2.7 0.7 - 4.0 K/uL   Monocytes Relative 6 %   Monocytes Absolute 0.4 0.1 - 1.0 K/uL   Eosinophils Relative 3 %   Eosinophils Absolute 0.2 0.0 - 0.5 K/uL   Basophils Relative 1 %   Basophils Absolute 0.1 0.0 - 0.1 K/uL   Immature Granulocytes 1 %   Abs Immature Granulocytes 0.03 0.00 - 0.07 K/uL    Comment: Performed at [facility], [address] [place], [place] [address]    Blood Alcohol level:  Lab Results  Component Value Date   [facility] <10 04/15/2022    Metabolic Disorder Labs: Lab Results  Component Value Date   HGBA1C 5.5 04/24/2022   MPG 111 04/24/2022   No results found for: "PROLACTIN" Lab Results  Component Value Date   CHOL 220 (H) 04/24/2022   TRIG 700 (H) 04/24/2022   HDL 35 (L) 04/24/2022   CHOLHDL 6.3 04/24/2022   VLDL UNABLE TO CALCULATE IF TRIGLYCERIDE OVER 400 mg/dL 62/13/0865  LDLCALC UNABLE TO CALCULATE IF TRIGLYCERIDE OVER 400 mg/dL 16/01/9603    Physical Findings: AIMS: Facial and Oral Movements Muscles of Facial Expression: None, normal Lips and Perioral Area: None, normal Jaw: None, normal Tongue: None, normal,Extremity Movements Upper (arms, wrists, hands, fingers): None, normal Lower (legs, knees, ankles, toes): None, normal, Trunk Movements Neck, shoulders, hips: None, normal, Overall Severity Severity of abnormal movements (highest score from questions above): None, normal Incapacitation due to abnormal movements: None, normal Patient's awareness of abnormal movements (rate only patient's report): No Awareness, Dental Status Current problems with teeth and/or dentures?: No Does patient usually wear dentures?: No  CIWA:    COWS:     Musculoskeletal: Strength & Muscle Tone: within normal limits Gait & Station: normal Patient leans: N/A  Psychiatric Specialty Exam:  Presentation  General Appearance:  Disheveled  Eye Contact: Minimal  Speech: Pressured  Speech Volume: Normal  Handedness: Right   Mood and Affect  Mood: Euphoric; Irritable  Affect: Inappropriate   Thought Process  Thought Processes: Disorganized  Descriptions of Associations:Loose  Orientation:Partial  Thought Content:Illogical; Scattered  History of Schizophrenia/Schizoaffective disorder:No data recorded Duration of Psychotic Symptoms:No data recorded Hallucinations:No data recorded Ideas of Reference:Delusions; Paranoia  Suicidal Thoughts:No data recorded Homicidal Thoughts:No data recorded  Sensorium  Memory: Immediate Poor; Recent Poor; Remote Poor  Judgment: Poor  Insight: Poor   Executive Functions  Concentration: Fair  Attention Span: Fair  Recall: Poor  Fund of Knowledge: Poor  Language: Poor   Psychomotor Activity  Psychomotor Activity:No data recorded  Assets  Assets: Desire for Improvement; Social  Support   Sleep  Sleep:No data recorded   Physical Exam: Physical Exam Vitals and nursing note reviewed.  Constitutional:      Appearance: Normal appearance.  HENT:     Head: Normocephalic and atraumatic.     Mouth/Throat:     Pharynx: Oropharynx is clear.  Eyes:     Pupils: Pupils are equal, round, and reactive to light.  Cardiovascular:     Rate and Rhythm: Normal rate and regular rhythm.  Pulmonary:     Effort: Pulmonary effort is normal.     Breath sounds: Normal breath sounds.  Abdominal:     General: Abdomen is flat.     Palpations: Abdomen is soft.  Musculoskeletal:        General: Normal range of motion.  Skin:    General: Skin is warm and dry.  Neurological:     General: No focal deficit present.     Mental Status: He is alert. Mental status is at baseline.  Psychiatric:        Attention and Perception: Attention normal.        Mood and Affect: Mood normal. Affect is blunt.        Speech: Speech normal.        Behavior: Behavior is withdrawn.        Thought Content: Thought content normal.        Cognition and Memory: Cognition normal.    Review of Systems  Constitutional: Negative.   HENT: Negative.    Eyes: Negative.   Respiratory: Negative.    Cardiovascular: Negative.   Gastrointestinal: Negative.   Musculoskeletal: Negative.   Skin: Negative.   Neurological: Negative.   Psychiatric/Behavioral: Negative.     Blood pressure 114/76, pulse 95, temperature 97.9 F (36.6 C), temperature source Oral, resp. rate (!) 21, height 5\' 9"  (1.753 m), weight 96.4 kg, SpO2 98 %. Body mass index is 31.38 kg/m.  Treatment Plan Summary: Plan no change to overall treatment plan.  Continue medicine.  Vitals are all stable.  No new health problems.  Still working on discharge.  Mordecai Rasmussen, MD 09/11/2022, 10:43 AM

## 2022-09-11 NOTE — Progress Notes (Signed)
Patient calm and pleasant during assessment denying SI/HI/AVH. Pt compliant with medication administration per MD orders. Pt given education, support, and encouragement to be active in his treatment plan. Pt being monitored Q 15 minutes for safety per unit protocol, remains safe on the unit  

## 2022-09-11 NOTE — Group Note (Unsigned)
Date:  09/11/2022 Time:  6:22 PM  Group Topic/Focus:  Goals Group:   The focus of this group is to help patients establish daily goals to achieve during treatment and discuss how the patient can incorporate goal setting into their daily lives to aide in recovery.  Community Group     Participation Level:  {BHH PARTICIPATION LEVEL:22264}  Participation Quality:  {BHH PARTICIPATION QUALITY:22265}  Affect:  {BHH AFFECT:22266}  Cognitive:  {BHH COGNITIVE:22267}  Insight: {BHH Insight2:20797}  Engagement in Group:  {BHH ENGAGEMENT IN GROUP:22268}  Modes of Intervention:  {BHH MODES OF INTERVENTION:22269}  Additional Comments:  ***  Lindzee Gouge A Farhad Burleson 09/11/2022, 6:22 PM  

## 2022-09-11 NOTE — Progress Notes (Signed)
D- Patient alert and oriented x 3. Affect flat/mood congruent. Denies SI/ HI/ AVH, but noted responding to internal stimuli talking to himself in his room.. Patient denies pain. Patient denies depression and anxiety.  A- Scheduled medications administered to patient, per MD orders except for Colace and Protonix. States they "constipate me". Support and encouragement provided.  Routine safety checks conducted every 15 minutes without incident.  Patient informed to notify staff with problems or concerns and verbalizes understanding. R- No adverse drug reactions noted.  Patient compliant with Clozaril therapy and treatment plan. Patient receptive, calm and cooperative. He isolates to his room except for meals but he did participate in outdoor activity. Patient contracts for safety and  remains safe on the unit at this time.

## 2022-09-11 NOTE — Group Note (Signed)
[facility] LCSW Group Therapy Note   Group Date: 09/11/2022 Start Time: 1300 End Time: 1400   Type of Therapy/Topic:  Group Therapy:  Emotion Regulation  Participation Level:  Did Not Attend   Mood:  Description of Group:    The purpose of this group is to assist patients in learning to regulate negative emotions and experience positive emotions. Patients will be guided to discuss ways in which they have been vulnerable to their negative emotions. These vulnerabilities will be juxtaposed with experiences of positive emotions or situations, and patients challenged to use positive emotions to combat negative ones. Special emphasis will be placed on coping with negative emotions in conflict situations, and patients will process healthy conflict resolution skills.  Therapeutic Goals: Patient will identify two positive emotions or experiences to reflect on in order to balance out negative emotions:  Patient will label two or more emotions that they find the most difficult to experience:  Patient will be able to demonstrate positive conflict resolution skills through discussion or role plays:   Summary of Patient Progress:  Patient did not attend group despite encouraged participation.    Therapeutic Modalities:   Cognitive Behavioral Therapy Feelings Identification Dialectical Behavioral Therapy   Dewel Lotter W Navaya Wiatrek, LCSWA 

## 2022-09-11 NOTE — Group Note (Signed)
Date:  09/11/2022 Time:  9:00 AM  Group Topic/Focus:  Goals Group:   The focus of this group is to help patients establish daily goals to achieve during treatment and discuss how the patient can incorporate goal setting into their daily lives to aide in recovery.  Community Group    Participation Level:  Did Not Attend  Travis Sandoval 09/11/2022, 6:28 PM

## 2022-09-11 NOTE — Group Note (Signed)
Recreation Therapy Group Note   Group Topic:Healthy Support Systems  Group Date: 09/11/2022 Start Time: 1000 End Time: 1100 Facilitators: Rosina Lowenstein, LRT, CTRS Location:  Craft Room  Group Description: Straw Bridge. Patients were given 10 plastic drinking straws and an equal length of masking tape. Using the materials provided, in pairs patients were instructed to build a free-standing bridge-like structure to suspend an everyday item (ex: deck of cards) off the floor or table surface. All materials were required to be used in Secondary school teacher. LRT facilitated post-activity discussion reviewing the importance of having strong and healthy support systems in our lives. LRT discussed how the people in our lives serve as the tape and the book we placed on top of our straw structure are the stressors we face in daily life. LRT and pts discussed what happens in our life when things get too heavy for [place], and we don't have strong supports outside of the hospital. Pt shared 2 of their healthy supports aloud in the group.    Goal Area(s) Addressed:  Patient will identify 2 healthy supports in their life. Patient will identify skills to successfully complete activity. Patient will identify correlation of this activity to life post-discharge.   Affect/Mood: N/A   Participation Level: Did not attend    Clinical Observations/Individualized Feedback: Travis Sandoval did not attend group due to resting in his room.  Plan: Continue to engage patient in RT group sessions 2-3x/week.   Rosina Lowenstein, LRT, CTRS 09/11/2022 11:50 AM

## 2022-09-12 NOTE — Progress Notes (Signed)
[facility] MD Progress Note  09/12/2022 2:01 PM Travis Sandoval  MRN:  [id number] Subjective: Patient seen.  No change.  No new complaints.  No behavior change.  No physical complaints. Principal Problem: Undifferentiated schizophrenia (HCC) Diagnosis: Principal Problem:   Undifferentiated schizophrenia (HCC)  Total Time spent with patient: 20 minutes  Past Psychiatric History: History of schizophrenia  Past Medical History:  Past Medical History:  Diagnosis Date   ADHD    Bipolar 1 disorder (HCC)    Hepatitis C    Schizoaffective disorder (HCC)    History reviewed. No pertinent surgical history. Family History:  Family History  Family history unknown: Yes   Family Psychiatric  History: No change Social History:  Social History   Substance and Sexual Activity  Alcohol Use Not Currently     Social History   Substance and Sexual Activity  Drug Use Not Currently    Social History   Socioeconomic History   Marital status: Unknown    Spouse name: Not on file   Number of children: Not on file   Years of education: Not on file   Highest education level: Not on file  Occupational History   Not on file  Tobacco Use   Smoking status: Every Day    Packs/day: 0.25    Years: 15.00    Additional pack years: 0.00    Total pack years: 3.75    Types: Cigarettes   Smokeless tobacco: Not on file  Vaping Use   Vaping Use: Not on file  Substance and Sexual Activity   Alcohol use: Not Currently   Drug use: Not Currently   Sexual activity: Not Currently  Other Topics Concern   Not on file  Social History Narrative   Not on file   Social Determinants of Health   Financial Resource Strain: Not on file  Food Insecurity: Patient Declined (05/18/2022)   Hunger Vital Sign    Worried About Running Out of Food in the Last Year: Patient declined    Ran Out of Food in the Last Year: Patient declined  Transportation Needs: Patient Declined (05/18/2022)   PRAPARE - Doctor, general practice (Medical): Patient declined    Lack of Transportation (Non-Medical): Patient declined  Physical Activity: Not on file  Stress: Not on file  Social Connections: Not on file   Additional Social History:                         Sleep: Fair  Appetite:  Fair  Current Medications: Current Facility-Administered Medications  Medication Dose Route Frequency Provider Last Rate Last Admin   acetaminophen (TYLENOL) tablet 650 mg  650 mg Oral Q6H PRN Gabriel Cirri F, NP   650 mg at 08/28/22 2109   alum & mag hydroxide-simeth (MAALOX/MYLANTA) 200-200-20 MG/5ML suspension 30 mL  30 mL Oral Q4H PRN Gabriel Cirri F, NP   30 mL at 09/11/22 2110   ARIPiprazole ER (ABILIFY MAINTENA) injection 400 mg  400 mg Intramuscular Q28 days Dail Meece T, MD   400 mg at 08/22/22 1615   cloZAPine (CLOZARIL) tablet 300 mg  300 mg Oral QHS Elijha Dedman T, MD   300 mg at 09/11/22 2110   docusate sodium (COLACE) capsule 200 mg  200 mg Oral BID Travia Onstad T, MD   200 mg at 07/27/22 0939   famotidine (PEPCID) tablet 20 mg  20 mg Oral BID Kamylah Manzo, [name], MD   20 mg at  07/20/22 1740   guaiFENesin (ROBITUSSIN) 100 MG/5ML liquid 5 mL  5 mL Oral Q4H PRN Sarahelizabeth Conway, [name], MD   5 mL at 08/22/22 2110   hydrOXYzine (ATARAX) tablet 50 mg  50 mg Oral Q6H PRN Almina Schul, [name], MD   50 mg at 08/10/22 2143   magnesium citrate solution 1 Bottle  1 Bottle Oral Daily PRN Jaynie Bream, RPH   1 Bottle at 07/19/22 1643   magnesium hydroxide (MILK OF MAGNESIA) suspension 30 mL  30 mL Oral Daily PRN Jaynie Bream, RPH   30 mL at 08/27/22 2117   mineral oil liquid   Oral Daily PRN Sarina Ill, DO   Given at 07/17/22 1201   nicotine polacrilex (NICORETTE) gum 2 mg  2 mg Oral PRN Ismahan Lippman, [name], MD   2 mg at 04/23/22 2200   polyethylene glycol (MIRALAX / GLYCOLAX) packet 17 g  17 g Oral Daily Ruhee Enck, [name], MD   17 g at 09/11/22 1030   senna-docusate (Senokot-S) tablet 2 tablet  2  tablet Oral Daily PRN Kaycie Pegues, [name], MD   2 tablet at 07/14/22 0809   ziprasidone (GEODON) injection 20 mg  20 mg Intramuscular Q12H PRN Reggie Pile, MD        Lab Results:  Results for orders placed or performed during the hospital encounter of 04/16/22 (from the past 48 hour(s))  CBC with Differential/Platelet     Status: None   Collection Time: 09/11/22  9:12 AM  Result Value Ref Range   WBC 6.4 4.0 - 10.5 K/uL   RBC 5.08 4.22 - 5.81 MIL/uL   Hemoglobin 14.4 13.0 - 17.0 g/dL   HCT 16.1 09.6 - 04.5 %   MCV 85.4 80.0 - 100.0 fL   MCH 28.3 26.0 - 34.0 pg   MCHC 33.2 30.0 - 36.0 g/dL   RDW 40.9 81.1 - 91.4 %   Platelets 165 150 - 400 K/uL   nRBC 0.0 0.0 - 0.2 %   Neutrophils Relative % 47 %   Neutro Abs 3.0 1.7 - 7.7 K/uL   Lymphocytes Relative 42 %   Lymphs Abs 2.7 0.7 - 4.0 K/uL   Monocytes Relative 6 %   Monocytes Absolute 0.4 0.1 - 1.0 K/uL   Eosinophils Relative 3 %   Eosinophils Absolute 0.2 0.0 - 0.5 K/uL   Basophils Relative 1 %   Basophils Absolute 0.1 0.0 - 0.1 K/uL   Immature Granulocytes 1 %   Abs Immature Granulocytes 0.03 0.00 - 0.07 K/uL    Comment: Performed at [facility], [address] [place], [place] [address]    Blood Alcohol level:  Lab Results  Component Value Date   [facility] <10 04/15/2022    Metabolic Disorder Labs: Lab Results  Component Value Date   HGBA1C 5.5 04/24/2022   MPG 111 04/24/2022   No results found for: "PROLACTIN" Lab Results  Component Value Date   CHOL 220 (H) 04/24/2022   TRIG 700 (H) 04/24/2022   HDL 35 (L) 04/24/2022   CHOLHDL 6.3 04/24/2022   VLDL UNABLE TO CALCULATE IF TRIGLYCERIDE OVER 400 mg/dL 62/13/0865   LDLCALC UNABLE TO CALCULATE IF TRIGLYCERIDE OVER 400 mg/dL 78/46/9629    Physical Findings: AIMS: Facial and Oral Movements Muscles of Facial Expression: None, normal Lips and Perioral Area: None, normal Jaw: None, normal Tongue: None, normal,Extremity Movements Upper (arms, wrists, hands,  fingers): None, normal Lower (legs, knees, ankles, toes): None, normal, Trunk Movements Neck, shoulders,  hips: None, normal, Overall Severity Severity of abnormal movements (highest score from questions above): None, normal Incapacitation due to abnormal movements: None, normal Patient's awareness of abnormal movements (rate only patient's report): No Awareness, Dental Status Current problems with teeth and/or dentures?: No Does patient usually wear dentures?: No  CIWA:    COWS:     Musculoskeletal: Strength & Muscle Tone: within normal limits Gait & Station: normal Patient leans: Right  Psychiatric Specialty Exam:  Presentation  General Appearance:  Disheveled  Eye Contact: Minimal  Speech: Pressured  Speech Volume: Normal  Handedness: Right   Mood and Affect  Mood: Euphoric; Irritable  Affect: Inappropriate   Thought Process  Thought Processes: Disorganized  Descriptions of Associations:Loose  Orientation:Partial  Thought Content:Illogical; Scattered  History of Schizophrenia/Schizoaffective disorder:No data recorded Duration of Psychotic Symptoms:No data recorded Hallucinations:No data recorded Ideas of Reference:Delusions; Paranoia  Suicidal Thoughts:No data recorded Homicidal Thoughts:No data recorded  Sensorium  Memory: Immediate Poor; Recent Poor; Remote Poor  Judgment: Poor  Insight: Poor   Executive Functions  Concentration: Fair  Attention Span: Fair  Recall: Poor  Fund of Knowledge: Poor  Language: Poor   Psychomotor Activity  Psychomotor Activity:No data recorded  Assets  Assets: Desire for Improvement; Social Support   Sleep  Sleep:No data recorded   Physical Exam: Physical Exam Vitals reviewed.  Constitutional:      Appearance: Normal appearance.  HENT:     Head: Normocephalic and atraumatic.     Mouth/Throat:     Pharynx: Oropharynx is clear.  Eyes:     Pupils: Pupils are equal, round, and  reactive to light.  Cardiovascular:     Rate and Rhythm: Normal rate and regular rhythm.  Pulmonary:     Effort: Pulmonary effort is normal.     Breath sounds: Normal breath sounds.  Abdominal:     General: Abdomen is flat.     Palpations: Abdomen is soft.  Musculoskeletal:        General: Normal range of motion.  Skin:    General: Skin is warm and dry.  Neurological:     General: No focal deficit present.     Mental Status: He is alert. Mental status is at baseline.  Psychiatric:        Mood and Affect: Mood normal.        Thought Content: Thought content normal.    Review of Systems  Constitutional: Negative.   HENT: Negative.    Eyes: Negative.   Respiratory: Negative.    Cardiovascular: Negative.   Gastrointestinal: Negative.   Musculoskeletal: Negative.   Skin: Negative.   Neurological: Negative.   Psychiatric/Behavioral: Negative.     Blood pressure 109/77, pulse 90, temperature 97.8 F (36.6 C), temperature source Oral, resp. rate 20, height 5\' 9"  (1.753 m), weight 96.4 kg, SpO2 96 %. Body mass index is 31.38 kg/m.   Treatment Plan Summary: Plan no change to presentation no change to plan.  Looking for placement.  Mordecai Rasmussen, MD 09/12/2022, 2:01 PM

## 2022-09-12 NOTE — Group Note (Signed)
Date:  09/12/2022 Time:  11:08 PM  Group Topic/Focus:  Wrap-Up Group:   The focus of this group is to help patients review their daily goal of treatment and discuss progress on daily workbooks.    Participation Level:  Active  Participation Quality:  Appropriate and Attentive  Affect:  Appropriate  Cognitive:  Alert and Appropriate  Insight: Good and Improving  Engagement in Group:  Developing/Improving and Improving  Modes of Intervention:  Education and Support  Additional Comments:     Dagon Budai 09/12/2022, 11:08 PM

## 2022-09-12 NOTE — Group Note (Signed)
[facility] LCSW Group Therapy Note   Group Date: 09/12/2022 Start Time: 1300 End Time: 1350   Type of Therapy/Topic:  Group Therapy:  Balance in Life  Participation Level:  Did Not Attend   Description of Group:    This group will address the concept of balance and how it feels and looks when one is unbalanced. Patients will be encouraged to process areas in their lives that are out of balance, and identify reasons for remaining unbalanced. Facilitators will guide patients utilizing problem- solving interventions to address and correct the stressor making their life unbalanced. Understanding and applying boundaries will be explored and addressed for obtaining  and maintaining a balanced life. Patients will be encouraged to explore ways to assertively make their unbalanced needs known to significant others in their lives, using other group members and facilitator for support and feedback.  Therapeutic Goals: Patient will identify two or more emotions or situations they have that consume much of in their lives. Patient will identify signs/triggers that life has become out of balance:  Patient will identify two ways to set boundaries in order to achieve balance in their lives:  Patient will demonstrate ability to communicate their needs through discussion and/or role plays  Summary of Patient Progress: X   Therapeutic Modalities:   Cognitive Behavioral Therapy Solution-Focused Therapy Assertiveness Training   Travis Sandoval Kellin Bartling, LCSW 

## 2022-09-12 NOTE — Plan of Care (Signed)
D- Patient alert and oriented. Patient presents in a pleasant mood on assessment stating that he slept "alright" last night and had no complaints to voice to this Clinical research associate. Patient denies SI, HI, AVH, and pain at this time. Patient also denies any signs/symptoms of depression and anxiety, stating that overall, he is feeling "good". Patient has no stated goals for today.   A- Patient only wants to take scheduled Miralax, and refuses to take the Pepcid and Colace. Patient asked to take the Miralax later on today. Patient Support and encouragement provided. Routine safety checks conducted every 15 minutes. Patient informed to notify staff with problems or concerns.   R- No adverse drug reactions noted. Patient contracts for safety at this time. Patient compliant with some medications. Patient receptive, calm, and cooperative. Patient isolates to room, except for meals and medication, when he's ready to take it. Patient remains safe at this time.  Problem: Education: Goal: Knowledge of General Education information will improve Description: Including pain rating scale, medication(s)/side effects and non-pharmacologic comfort measures Outcome: Progressing   Problem: Health Behavior/Discharge Planning: Goal: Ability to manage health-related needs will improve Outcome: Progressing   Problem: Clinical Measurements: Goal: Ability to maintain clinical measurements within normal limits will improve Outcome: Progressing Goal: Will remain free from infection Outcome: Progressing Goal: Diagnostic test results will improve Outcome: Progressing Goal: Respiratory complications will improve Outcome: Progressing Goal: Cardiovascular complication will be avoided Outcome: Progressing   Problem: Activity: Goal: Risk for activity intolerance will decrease Outcome: Progressing   Problem: Nutrition: Goal: Adequate nutrition will be maintained Outcome: Progressing   Problem: Coping: Goal: Level of anxiety  will decrease Outcome: Progressing   Problem: Elimination: Goal: Will not experience complications related to urinary retention Outcome: Progressing   Problem: Pain Managment: Goal: General experience of comfort will improve Outcome: Progressing   Problem: Safety: Goal: Ability to remain free from injury will improve Outcome: Progressing   Problem: Skin Integrity: Goal: Risk for impaired skin integrity will decrease Outcome: Progressing   Problem: Education: Goal: Knowledge of [facility] General Education information/materials will improve Outcome: Progressing Goal: Emotional status will improve Outcome: Progressing Goal: Mental status will improve Outcome: Progressing Goal: Verbalization of understanding the information provided will improve Outcome: Progressing   Problem: Activity: Goal: Interest or engagement in activities will improve Outcome: Progressing Goal: Sleeping patterns will improve Outcome: Progressing   Problem: Coping: Goal: Ability to verbalize frustrations and anger appropriately will improve Outcome: Progressing Goal: Ability to demonstrate self-control will improve Outcome: Progressing   Problem: Health Behavior/Discharge Planning: Goal: Identification of resources available to assist in meeting health care needs will improve Outcome: Progressing Goal: Compliance with treatment plan for underlying cause of condition will improve Outcome: Progressing   Problem: Physical Regulation: Goal: Ability to maintain clinical measurements within normal limits will improve Outcome: Progressing   Problem: Safety: Goal: Periods of time without injury will increase Outcome: Progressing   Problem: Activity: Goal: Will verbalize the importance of balancing activity with adequate rest periods Outcome: Progressing   Problem: Education: Goal: Will be free of psychotic symptoms Outcome: Progressing Goal: Knowledge of the prescribed therapeutic regimen  will improve Outcome: Progressing   Problem: Coping: Goal: Coping ability will improve Outcome: Progressing Goal: Will verbalize feelings Outcome: Progressing   Problem: Health Behavior/Discharge Planning: Goal: Compliance with prescribed medication regimen will improve Outcome: Progressing   Problem: Nutritional: Goal: Ability to achieve adequate nutritional intake will improve Outcome: Progressing   Problem: Role Relationship: Goal: Ability to communicate needs accurately will  improve Outcome: Progressing Goal: Ability to interact with others will improve Outcome: Progressing   Problem: Safety: Goal: Ability to redirect hostility and anger into socially appropriate behaviors will improve Outcome: Progressing Goal: Ability to remain free from injury will improve Outcome: Progressing   Problem: Self-Care: Goal: Ability to participate in self-care as condition permits will improve Outcome: Progressing   Problem: Self-Concept: Goal: Will verbalize positive feelings about self Outcome: Progressing

## 2022-09-12 NOTE — Progress Notes (Signed)
Patient calm and pleasant during assessment denying SI/HI/AVH. Pt compliant with medication administration per MD orders. Pt given education, support, and encouragement to be active in his treatment plan. Pt being monitored Q 15 minutes for safety per unit protocol, remains safe on the unit  

## 2022-09-12 NOTE — Group Note (Signed)
Date:  09/12/2022 Time:  9:56 AM  Group Topic/Focus:  Community Meeting    Participation Level:  Did Not Attend   Charmion Hapke Travis Neyda Durango 09/12/2022, 9:56 AM  

## 2022-09-12 NOTE — Progress Notes (Signed)
Patient states that he wants to take scheduled Miralax after lunch. MD notified during progression rounds.

## 2022-09-12 NOTE — Group Note (Signed)
Recreation Therapy Group Note   Group Topic:Self-Esteem  Group Date: 09/12/2022 Start Time: 1000 End Time: 1100 Facilitators: Rosina Lowenstein, LRT, CTRS Location:  Craft Room  Group Description: Patients and LRT discussed the importance of self-love and self-esteem. Pt completed a worksheet that helps them identify 24 different strengths and qualities about themselves. Pt encouraged to read aloud at least 3 off their sheet to the group. LRT and pts discussed how this can be applied to daily life post-discharge.  Pt's then played "Positive Affirmation Bingo" afterwards, with journals, activity books or stress balls as bingo prizes.  Goal Area(s) Addressed: Patient will identify positive qualities about themselves. Patient will learn new positive affirmations.  Patient will recite positive qualities and affirmations aloud to the group.  Patient will increase communication.  Affect/Mood: N/A   Participation Level: Did not attend    Clinical Observations/Individualized Feedback: Travis Sandoval did not attend group due to resting in his room.  Plan: Continue to engage patient in RT group sessions 2-3x/week.   Rosina Lowenstein, LRT, CTRS 09/12/2022 11:50 AM

## 2022-09-13 DIAGNOSIS — F203 Undifferentiated schizophrenia: Secondary | ICD-10-CM | POA: Diagnosis not present

## 2022-09-13 NOTE — Group Note (Signed)
Date:  09/13/2022 Time:  4:51 PM  Group Topic/Focus:  Outdoor Recreation/Activity    Participation Level:  Active  Participation Quality:  Appropriate  Affect:  Appropriate  Cognitive:  Appropriate  Insight: Appropriate  Engagement in Group:  Engaged  Modes of Intervention:  Activity  Additional Comments:    Travis Sandoval Travis Sandoval Travis Sandoval 09/13/2022, 4:51 PM

## 2022-09-13 NOTE — Group Note (Signed)
Recreation Therapy Group Note   Group Topic:Leisure Education  Group Date: 09/13/2022 Start Time: 1000 End Time: 1100 Facilitators: Rosina Lowenstein, LRT, CTRS Location:  Craft Room  Group Description: Leisure. Patients were given the option to choose from singing karaoke, making origami, using oil pastels, coloring mandalas, or playing UNO. LRT and pts discussed the meaning of leisure, the importance of participating in leisure during their free time/when they're outside of the hospital, as well as how our leisure interests can also serve as coping skills. Pt identified two leisure interests and shared with the group.   Goal Area(s) Addressed:  Patient will identify a current leisure interest.  Patient will learn the definition of "leisure". Patient will practice making a positive decision. Patient will have the opportunity to try a new leisure activity. Patient will communicate with peers and LRT.   Affect/Mood: N/A   Participation Level: Did not attend    Clinical Observations/Individualized Feedback: Travis Sandoval did not attend group due to resting in his room.  Plan: Continue to engage patient in RT group sessions 2-3x/week.   Rosina Lowenstein, LRT, CTRS 09/13/2022 11:28 AM

## 2022-09-13 NOTE — Group Note (Signed)
Date:  09/13/2022 Time:  9:53 AM  Group Topic/Focus:  Goals Group:   The focus of this group is to help patients establish daily goals to achieve during treatment and discuss how the patient can incorporate goal setting into their daily lives to aide in recovery.    Participation Level:  Did Not Attend   Kady Toothaker Travis Rian Busche 09/13/2022, 9:53 AM  

## 2022-09-13 NOTE — Progress Notes (Signed)
[facility] MD Progress Note  09/13/2022 3:25 PM Travis Sandoval  MRN:  [id number] Subjective: Follow-up patient with schizophrenia.  No change to complaints or behavior.  Denies symptoms.  Mostly stays isolated but did go outside with group today.  No health complaints. Principal Problem: Undifferentiated schizophrenia (HCC) Diagnosis: Principal Problem:   Undifferentiated schizophrenia (HCC)  Total Time spent with patient: 20 minutes  Past Psychiatric History: Past history of schizophrenia  Past Medical History:  Past Medical History:  Diagnosis Date   ADHD    Bipolar 1 disorder (HCC)    Hepatitis C    Schizoaffective disorder (HCC)    History reviewed. No pertinent surgical history. Family History:  Family History  Family history unknown: Yes   Family Psychiatric  History: See previous Social History:  Social History   Substance and Sexual Activity  Alcohol Use Not Currently     Social History   Substance and Sexual Activity  Drug Use Not Currently    Social History   Socioeconomic History   Marital status: Unknown    Spouse name: Not on file   Number of children: Not on file   Years of education: Not on file   Highest education level: Not on file  Occupational History   Not on file  Tobacco Use   Smoking status: Every Day    Packs/day: 0.25    Years: 15.00    Additional pack years: 0.00    Total pack years: 3.75    Types: Cigarettes   Smokeless tobacco: Not on file  Vaping Use   Vaping Use: Not on file  Substance and Sexual Activity   Alcohol use: Not Currently   Drug use: Not Currently   Sexual activity: Not Currently  Other Topics Concern   Not on file  Social History Narrative   Not on file   Social Determinants of Health   Financial Resource Strain: Not on file  Food Insecurity: Patient Declined (05/18/2022)   Hunger Vital Sign    Worried About Running Out of Food in the Last Year: Patient declined    Ran Out of Food in the Last Year: Patient  declined  Transportation Needs: Patient Declined (05/18/2022)   PRAPARE - Administrator, Civil Service (Medical): Patient declined    Lack of Transportation (Non-Medical): Patient declined  Physical Activity: Not on file  Stress: Not on file  Social Connections: Not on file   Additional Social History:                         Sleep: Fair  Appetite:  Fair  Current Medications: Current Facility-Administered Medications  Medication Dose Route Frequency Provider Last Rate Last Admin   acetaminophen (TYLENOL) tablet 650 mg  650 mg Oral Q6H PRN Gabriel Cirri F, NP   650 mg at 08/28/22 2109   alum & mag hydroxide-simeth (MAALOX/MYLANTA) 200-200-20 MG/5ML suspension 30 mL  30 mL Oral Q4H PRN Gabriel Cirri F, NP   30 mL at 09/12/22 2047   ARIPiprazole ER (ABILIFY MAINTENA) injection 400 mg  400 mg Intramuscular Q28 days Dayanis Bergquist T, MD   400 mg at 08/22/22 1615   cloZAPine (CLOZARIL) tablet 300 mg  300 mg Oral QHS Aysha Livecchi T, MD   300 mg at 09/12/22 2047   docusate sodium (COLACE) capsule 200 mg  200 mg Oral BID Wilian Kwong T, MD   200 mg at 07/27/22 0939   famotidine (PEPCID) tablet 20 mg  20 mg Oral BID Ambrie Carte T, MD   20 mg at 07/20/22 1740   guaiFENesin (ROBITUSSIN) 100 MG/5ML liquid 5 mL  5 mL Oral Q4H PRN Jacarius Handel T, MD   5 mL at 08/22/22 2110   hydrOXYzine (ATARAX) tablet 50 mg  50 mg Oral Q6H PRN Zakariye Nee T, MD   50 mg at 08/10/22 2143   magnesium citrate solution 1 Bottle  1 Bottle Oral Daily PRN Jaynie Bream, RPH   1 Bottle at 07/19/22 1643   magnesium hydroxide (MILK OF MAGNESIA) suspension 30 mL  30 mL Oral Daily PRN Jaynie Bream, RPH   30 mL at 08/27/22 2117   mineral oil liquid   Oral Daily PRN Sarina Ill, DO   Given at 07/17/22 1201   nicotine polacrilex (NICORETTE) gum 2 mg  2 mg Oral PRN Tashia Leiterman, [name], MD   2 mg at 04/23/22 2200   polyethylene glycol (MIRALAX / GLYCOLAX) packet 17 g  17 g Oral  Daily Surya Folden, [name], MD   17 g at 09/12/22 1648   senna-docusate (Senokot-S) tablet 2 tablet  2 tablet Oral Daily PRN Tiarna Koppen, [name], MD   2 tablet at 07/14/22 0809   ziprasidone (GEODON) injection 20 mg  20 mg Intramuscular Q12H PRN Reggie Pile, MD        Lab Results: No results found for this or any previous visit (from the past 48 hour(s)).  Blood Alcohol level:  Lab Results  Component Value Date   ETH <10 04/15/2022    Metabolic Disorder Labs: Lab Results  Component Value Date   HGBA1C 5.5 04/24/2022   MPG 111 04/24/2022   No results found for: "PROLACTIN" Lab Results  Component Value Date   CHOL 220 (H) 04/24/2022   TRIG 700 (H) 04/24/2022   HDL 35 (L) 04/24/2022   CHOLHDL 6.3 04/24/2022   VLDL UNABLE TO CALCULATE IF TRIGLYCERIDE OVER 400 mg/dL 62/13/0865   LDLCALC UNABLE TO CALCULATE IF TRIGLYCERIDE OVER 400 mg/dL 78/46/9629    Physical Findings: AIMS: Facial and Oral Movements Muscles of Facial Expression: None, normal Lips and Perioral Area: None, normal Jaw: None, normal Tongue: None, normal,Extremity Movements Upper (arms, wrists, hands, fingers): None, normal Lower (legs, knees, ankles, toes): None, normal, Trunk Movements Neck, shoulders, hips: None, normal, Overall Severity Severity of abnormal movements (highest score from questions above): None, normal Incapacitation due to abnormal movements: None, normal Patient's awareness of abnormal movements (rate only patient's report): No Awareness, Dental Status Current problems with teeth and/or dentures?: No Does patient usually wear dentures?: No  CIWA:    COWS:     Musculoskeletal: Strength & Muscle Tone: within normal limits Gait & Station: normal Patient leans: N/A  Psychiatric Specialty Exam:  Presentation  General Appearance:  Disheveled  Eye Contact: Minimal  Speech: Pressured  Speech Volume: Normal  Handedness: Right   Mood and Affect  Mood: Euphoric;  Irritable  Affect: Inappropriate   Thought Process  Thought Processes: Disorganized  Descriptions of Associations:Loose  Orientation:Partial  Thought Content:Illogical; Scattered  History of Schizophrenia/Schizoaffective disorder:No data recorded Duration of Psychotic Symptoms:No data recorded Hallucinations:No data recorded Ideas of Reference:Delusions; Paranoia  Suicidal Thoughts:No data recorded Homicidal Thoughts:No data recorded  Sensorium  Memory: Immediate Poor; Recent Poor; Remote Poor  Judgment: Poor  Insight: Poor   Executive Functions  Concentration: Fair  Attention Span: Fair  Recall: Poor  Fund of Knowledge: Poor  Language: Poor   Psychomotor Activity  Psychomotor Activity:No data  recorded  Assets  Assets: Desire for Improvement; Social Support   Sleep  Sleep:No data recorded   Physical Exam: Physical Exam Vitals and nursing note reviewed.  Constitutional:      Appearance: Normal appearance.  HENT:     Head: Normocephalic and atraumatic.     Mouth/Throat:     Pharynx: Oropharynx is clear.  Eyes:     Pupils: Pupils are equal, round, and reactive to light.  Cardiovascular:     Rate and Rhythm: Normal rate and regular rhythm.  Pulmonary:     Effort: Pulmonary effort is normal.     Breath sounds: Normal breath sounds.  Abdominal:     General: Abdomen is flat.     Palpations: Abdomen is soft.  Musculoskeletal:        General: Normal range of motion.  Skin:    General: Skin is warm and dry.  Neurological:     General: No focal deficit present.     Mental Status: He is alert. Mental status is at baseline.  Psychiatric:        Attention and Perception: Attention normal.        Mood and Affect: Mood normal. Affect is blunt.        Speech: Speech normal.        Behavior: Behavior is withdrawn.        Thought Content: Thought content normal.        Cognition and Memory: Cognition is impaired.    Review of Systems   Constitutional: Negative.   HENT: Negative.    Eyes: Negative.   Respiratory: Negative.    Cardiovascular: Negative.   Gastrointestinal: Negative.   Musculoskeletal: Negative.   Skin: Negative.   Neurological: Negative.   Psychiatric/Behavioral: Negative.     Blood pressure 110/72, pulse 92, temperature 97.8 F (36.6 C), temperature source Oral, resp. rate 18, height 5\' 9"  (1.753 m), weight 96.4 kg, SpO2 98 %. Body mass index is 31.38 kg/m.   Treatment Plan Summary: Plan completely stable.  No change to treatment.  Still waiting for placement  Mordecai Rasmussen, MD 09/13/2022, 3:25 PM

## 2022-09-13 NOTE — Plan of Care (Signed)
Patient refused all medicines today. States " I am alright." Patient stated that he had a BM today. Patient went outside to courtyard. Denies SI,HI and AVH. Patient stated that he had a shower last night. Appetite and energy level good. No issues verbalized. Support and encouragement given.

## 2022-09-14 DIAGNOSIS — F203 Undifferentiated schizophrenia: Secondary | ICD-10-CM | POA: Diagnosis not present

## 2022-09-14 NOTE — BH IP Treatment Plan (Signed)
Interdisciplinary Treatment and Diagnostic Plan Update  09/14/2022 Time of Session: 0830 Travis Sandoval MRN: [id number]  Principal Diagnosis: Undifferentiated schizophrenia [facility])  Secondary Diagnoses: Principal Problem:   Undifferentiated schizophrenia (HCC)   Current Medications:  Current Facility-Administered Medications  Medication Dose Route Frequency Provider Last Rate Last Admin   acetaminophen (TYLENOL) tablet 650 mg  650 mg Oral Q6H PRN Gabriel Cirri F, NP   650 mg at 08/28/22 2109   alum & mag hydroxide-simeth (MAALOX/MYLANTA) 200-200-20 MG/5ML suspension 30 mL  30 mL Oral Q4H PRN Gabriel Cirri F, NP   30 mL at 09/12/22 2047   ARIPiprazole ER (ABILIFY MAINTENA) injection 400 mg  400 mg Intramuscular Q28 days [name], [name], MD   400 mg at 08/22/22 1615   cloZAPine (CLOZARIL) tablet 300 mg  300 mg Oral QHS [name], [name], MD   300 mg at 09/13/22 2150   guaiFENesin (ROBITUSSIN) 100 MG/5ML liquid 5 mL  5 mL Oral Q4H PRN [name], [name], MD   5 mL at 08/22/22 2110   hydrOXYzine (ATARAX) tablet 50 mg  50 mg Oral Q6H PRN [name], [name], MD   50 mg at 08/10/22 2143   magnesium citrate solution 1 Bottle  1 Bottle Oral Daily PRN Jaynie Bream, RPH   1 Bottle at 07/19/22 1643   magnesium hydroxide (MILK OF MAGNESIA) suspension 30 mL  30 mL Oral Daily PRN Jaynie Bream, RPH   30 mL at 09/13/22 2150   mineral oil liquid   Oral Daily PRN Sarina Ill, DO   Given at 07/17/22 1201   nicotine polacrilex (NICORETTE) gum 2 mg  2 mg Oral PRN [name], [name], MD   2 mg at 04/23/22 2200   polyethylene glycol (MIRALAX / GLYCOLAX) packet 17 g  17 g Oral Daily [name], [name], MD   17 g at 09/12/22 1648   senna-docusate (Senokot-S) tablet 2 tablet  2 tablet Oral Daily PRN [name], [name], MD   2 tablet at 07/14/22 0809   ziprasidone (GEODON) injection 20 mg  20 mg Intramuscular Q12H PRN Reggie Pile, MD       PTA Medications: Medications Prior to Admission  Medication Sig  Dispense Refill Last Dose   ABILIFY MAINTENA 400 MG SRER injection Inject 400 mg into the muscle every 28 (twenty-eight) days.      ARIPiprazole (ABILIFY) 10 MG tablet Take 10 mg by mouth daily. (Patient not taking: Reported on 04/16/2022)      atomoxetine (STRATTERA) 40 MG capsule Take 40 mg by mouth daily. (Patient not taking: Reported on 04/16/2022)      atorvastatin (LIPITOR) 40 MG tablet Take 40 mg by mouth daily.      budesonide-formoterol (SYMBICORT) 160-4.5 MCG/ACT inhaler Inhale 2 puffs into the lungs.      cetirizine (ZYRTEC) 10 MG tablet Take 10 mg by mouth daily.      clozapine (CLOZARIL) 200 MG tablet Take 200 mg by mouth 2 (two) times daily. Take along with one 25 mg tablet for total 225 mg twice daily      cloZAPine (CLOZARIL) 25 MG tablet Take 25 mg by mouth 2 (two) times daily. Take along with one 200 mg tablet for total 225 mg twice daily      Dexlansoprazole (DEXILANT) 30 MG capsule Take 30 mg by mouth daily. (Patient not taking: Reported on 04/16/2022)      hydrOXYzine (VISTARIL) 50 MG capsule Take 50 mg by mouth 3 (three) times daily as needed  for itching. (Patient not taking: Reported on 04/16/2022)      Melatonin 5 MG TABS Take 5 mg by mouth at bedtime as needed (sleep).      metoprolol succinate (TOPROL-XL) 25 MG 24 hr tablet Take 12.5 mg by mouth daily.      Phosphatidyl Choline 65 MG TABS Take 1 tablet by mouth daily.  (Patient not taking: Reported on 04/16/2022)      valproic acid (DEPAKENE) 250 MG/5ML solution Take 750-1,000 mLs by mouth 2 (two) times daily. 1000 mg (20 mL) every morning and 750 mg (15 mL) daily at bedtime       Patient Stressors: Other: Psychosis    Patient Strengths: Ability for insight  Active sense of humor  Average or above average intelligence  Capable of independent living  Supportive family/friends   Treatment Modalities: Medication Management, Group therapy, Case management,  1 to 1 session with clinician, Psychoeducation, Recreational  therapy.   Physician Treatment Plan for Primary Diagnosis: Undifferentiated schizophrenia (HCC) Long Term Goal(s):     Short Term Goals: None, pt is a poor hx.  Medication Management: Evaluate patient's response, side effects, and tolerance of medication regimen.  Therapeutic Interventions: 1 to 1 sessions, Unit Group sessions and Medication administration.  Evaluation of Outcomes: Progressing  Physician Treatment Plan for Secondary Diagnosis: Principal Problem:   Undifferentiated schizophrenia (HCC)  Long Term Goal(s):     Short Term Goals: None, pt is a poor hx.     Medication Management: Evaluate patient's response, side effects, and tolerance of medication regimen.  Therapeutic Interventions: 1 to 1 sessions, Unit Group sessions and Medication administration.  Evaluation of Outcomes: Progressing   RN Treatment Plan for Primary Diagnosis: Undifferentiated schizophrenia (HCC) Long Term Goal(s): Knowledge of disease and therapeutic regimen to maintain health will improve  Short Term Goals: Ability to remain free from injury will improve, Ability to verbalize frustration and anger appropriately will improve, Ability to demonstrate self-control, Ability to participate in decision making will improve, Ability to verbalize feelings will improve, Ability to disclose and discuss suicidal ideas, Ability to identify and develop effective coping behaviors will improve, and Compliance with prescribed medications will improve  Medication Management: RN will administer medications as ordered by provider, will assess and evaluate patient's response and provide education to patient for prescribed medication. RN will report any adverse and/or side effects to prescribing provider.  Therapeutic Interventions: 1 on 1 counseling sessions, Psychoeducation, Medication administration, Evaluate responses to treatment, Monitor vital signs and CBGs as ordered, Perform/monitor CIWA, COWS, AIMS and Fall  Risk screenings as ordered, Perform wound care treatments as ordered.  Evaluation of Outcomes: Progressing   LCSW Treatment Plan for Primary Diagnosis: Undifferentiated schizophrenia (HCC) Long Term Goal(s): Safe transition to appropriate next level of care at discharge, Engage patient in therapeutic group addressing interpersonal concerns.  Short Term Goals: Engage patient in aftercare planning with referrals and resources, Increase social support, Increase ability to appropriately verbalize feelings, Increase emotional regulation, Facilitate acceptance of mental health diagnosis and concerns, Facilitate patient progression through stages of change regarding substance use diagnoses and concerns, Identify triggers associated with mental health/substance abuse issues, and Increase skills for wellness and recovery  Therapeutic Interventions: Assess for all discharge needs, 1 to 1 time with Social worker, Explore available resources and support systems, Assess for adequacy in community support network, Educate family and significant other(s) on suicide prevention, Complete Psychosocial Assessment, Interpersonal group therapy.  Evaluation of Outcomes: Progressing   Progress in Treatment: Attending groups: No.  Participating in groups: No. Taking medication as prescribed: Yes. Toleration medication: Yes. Family/Significant other contact made: Yes, individual(s) contacted:  Enki Masley, father, [phone] Patient understands diagnosis: No. Discussing patient identified problems/goals with staff: Yes. Medical problems stabilized or resolved: Yes. Denies suicidal/homicidal ideation: Yes. Issues/concerns per patient self-inventory: Yes. Other: none  New problem(s) identified: No, Describe:  none  New Short Term/Long Term Goal(s): Patient to work towards elimination of symptoms of psychosis, medication management for mood stabilization; development of comprehensive mental wellness  plan.  Patient Goals:  No additional goals identified at this time. Patient to continue to work towards original goals identified in initial treatment team meeting. CSW will remain available to patient should they voice additional treatment goals.    Discharge Plan or Barriers: Patient lacks adequate funding for placement, currently DSS is attempting to become patient's payee to become.   Reason for Continuation of Hospitalization: Delusions  Medication stabilization  Estimated Length of Stay: TBD   Last 3 [place] Suicide Severity Risk Score: Flowsheet Row Admission (Current) from 04/16/2022 in [facility] INPATIENT BEHAV[facility]AL MEDICINE ED from 04/15/2022 in [facility] Emergency Department at [facility]  C-SSRS RISK CATEGORY No Risk No Risk        Scribe for Treatment Team: Almedia Balls 09/14/2022 9:58 AM

## 2022-09-14 NOTE — Progress Notes (Signed)
Patient requested to take medication after dinner. This Clinical research associate moved scheduled Miralax to 1700.

## 2022-09-14 NOTE — Group Note (Deleted)
[facility] LCSW Group Therapy Note   Group Date: 09/14/2022 Start Time: 1300 End Time: 1400  Type of Therapy and Topic:  Group Therapy:  Feelings around Relapse and Recovery  Participation Level:  {[facility] PARTICIPATION LEVEL:22264}   Mood:  Description of Group:    Patients in this group will discuss emotions they experience before and after a relapse. They will process how experiencing these feelings, or avoidance of experiencing them, relates to having a relapse. Facilitator will guide patients to explore emotions they have related to recovery. Patients will be encouraged to process which emotions are more powerful. They will be guided to discuss the emotional reaction significant others in their lives may have to patients' relapse or recovery. Patients will be assisted in exploring ways to respond to the emotions of others without this contributing to a relapse.  Therapeutic Goals: 1. Patient will identify two or more emotions that lead to relapse for them:  2. Patient will identify two emotions that result when they relapse:  3. Patient will identify two emotions related to recovery:  4. Patient will demonstrate ability to communicate their needs through discussion and/or role plays.   Summary of Patient Progress:  ***   Therapeutic Modalities:   Cognitive Behavioral Therapy Solution-Focused Therapy Assertiveness Training Relapse Prevention Therapy   Kemyah Buser W Careem Yasui, LCSWA 

## 2022-09-14 NOTE — Progress Notes (Addendum)
[facility] MD Progress Note  09/14/2022 8:05 AM Travis Sandoval  MRN:  [id number] Subjective:  Patient states that he is sleeping and eating sufficiently.  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  Reasons that is why he has microphones in speakers and his ears.  Denies hallucinations and subjective paranoia.  She is at the only stressful thing going on his being here.  Denies all symptoms of depression, anxiety, and irritability.  No new medical problems.  Denies having visitors here.  Denies having any constipation.  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  Principal Problem: Undifferentiated schizophrenia (HCC) Diagnosis: Principal Problem:   Undifferentiated schizophrenia (HCC)  Total Time spent with patient: 22 minutes.  Past Psychiatric History: Past history of schizophrenia  Past Medical History:  Past Medical History:  Diagnosis Date   ADHD    Bipolar 1 disorder (HCC)    Hepatitis C    Schizoaffective disorder (HCC)    History reviewed. No pertinent surgical history. Family History:  Family History  Family history unknown: Yes   Family Psychiatric  History: See previous Social History:  Social History   Substance and Sexual Activity  Alcohol Use Not Currently     Social History   Substance and Sexual Activity  Drug Use Not Currently    Social History   Socioeconomic History   Marital status: Unknown    Spouse name: Not on file   Number of children: Not on file   Years of education: Not on file   Highest education level: Not on file  Occupational History   Not on file  Tobacco Use   Smoking status: Every Day    Packs/day: 0.25    Years: 15.00    Additional pack years: 0.00    Total pack years: 3.75    Types: Cigarettes   Smokeless tobacco: Not on file  Vaping Use   Vaping Use: Not on file  Substance and Sexual Activity   Alcohol use: Not Currently   Drug use: Not Currently   Sexual  activity: Not Currently  Other Topics Concern   Not on file  Social History Narrative   Not on file   Social Determinants of Health   Financial Resource Strain: Not on file  Food Insecurity: Patient Declined (05/18/2022)   Hunger Vital Sign    Worried About Running Out of Food in the Last Year: Patient declined    Ran Out of Food in the Last Year: Patient declined  Transportation Needs: Patient Declined (05/18/2022)   PRAPARE - Administrator, Civil Service (Medical): Patient declined    Lack of Transportation (Non-Medical): Patient declined  Physical Activity: Not on file  Stress: Not on file  Social Connections: Not on file   Additional Social History:                         Sleep: Fair  Appetite:  Fair  Current Medications: Current Facility-Administered Medications  Medication Dose Route Frequency Provider Last Rate Last Admin   acetaminophen (TYLENOL) tablet 650 mg  650 mg Oral Q6H PRN Gabriel Cirri F, NP   650 mg at 08/28/22 2109   alum & mag hydroxide-simeth (MAALOX/MYLANTA) 200-200-20 MG/5ML suspension 30 mL  30 mL Oral Q4H PRN Gabriel Cirri F, NP   30 mL at 09/12/22 2047   ARIPiprazole ER (ABILIFY MAINTENA) injection 400 mg  400 mg  Intramuscular Q28 days [name], [name], MD   400 mg at 08/22/22 1615   cloZAPine (CLOZARIL) tablet 300 mg  300 mg Oral QHS [name], [name], MD   300 mg at 09/13/22 2150   docusate sodium (COLACE) capsule 200 mg  200 mg Oral BID [name], [name], MD   200 mg at 07/27/22 0939   famotidine (PEPCID) tablet 20 mg  20 mg Oral BID [name], [name], MD   20 mg at 07/20/22 1740   guaiFENesin (ROBITUSSIN) 100 MG/5ML liquid 5 mL  5 mL Oral Q4H PRN [name], [name], MD   5 mL at 08/22/22 2110   hydrOXYzine (ATARAX) tablet 50 mg  50 mg Oral Q6H PRN [name], [name], MD   50 mg at 08/10/22 2143   magnesium citrate solution 1 Bottle  1 Bottle Oral Daily PRN Jaynie Bream, RPH   1 Bottle at 07/19/22 1643   magnesium hydroxide  (MILK OF MAGNESIA) suspension 30 mL  30 mL Oral Daily PRN Jaynie Bream, RPH   30 mL at 09/13/22 2150   mineral oil liquid   Oral Daily PRN Sarina Ill, DO   Given at 07/17/22 1201   nicotine polacrilex (NICORETTE) gum 2 mg  2 mg Oral PRN [name], [name], MD   2 mg at 04/23/22 2200   polyethylene glycol (MIRALAX / GLYCOLAX) packet 17 g  17 g Oral Daily [name], [name], MD   17 g at 09/12/22 1648   senna-docusate (Senokot-S) tablet 2 tablet  2 tablet Oral Daily PRN [name], [name], MD   2 tablet at 07/14/22 0809   ziprasidone (GEODON) injection 20 mg  20 mg Intramuscular Q12H PRN Reggie Pile, MD        Lab Results: No results found for this or any previous visit (from the past 48 hour(s)).  Blood Alcohol level:  Lab Results  Component Value Date   ETH <10 04/15/2022    Metabolic Disorder Labs: Lab Results  Component Value Date   HGBA1C 5.5 04/24/2022   MPG 111 04/24/2022   No results found for: "PROLACTIN" Lab Results  Component Value Date   CHOL 220 (H) 04/24/2022   TRIG 700 (H) 04/24/2022   HDL 35 (L) 04/24/2022   CHOLHDL 6.3 04/24/2022   VLDL UNABLE TO CALCULATE IF TRIGLYCERIDE OVER 400 mg/dL 16/01/9603   LDLCALC UNABLE TO CALCULATE IF TRIGLYCERIDE OVER 400 mg/dL 54/12/8117    Physical Findings: AIMS: Facial and Oral Movements Muscles of Facial Expression: None, normal Lips and Perioral Area: None, normal Jaw: None, normal Tongue: None, normal,Extremity Movements Upper (arms, wrists, hands, fingers): None, normal Lower (legs, knees, ankles, toes): None, normal, Trunk Movements Neck, shoulders, hips: None, normal, Overall Severity Severity of abnormal movements (highest score from questions above): None, normal Incapacitation due to abnormal movements: None, normal Patient's awareness of abnormal movements (rate only patient's report): No Awareness, Dental Status Current problems with teeth and/or dentures?: No Does patient usually wear dentures?: No   CIWA:    COWS:     Musculoskeletal: Strength & Muscle Tone: within normal limits Gait & Station: normal Patient leans: N/A  Psychiatric Specialty Exam:  Presentation  General Appearance:  Disheveled  Eye Contact: Minimal  Speech: Within normal limits Speech Volume: Normal  Handedness: Right   Mood and Affect  Mood: Fine  Affect: Neutral  Thought Process  Thought Processes: Disorganized  Descriptions of Associations:Loose  Orientation:Partial  Thought Content:Illogical; Scattered  History of Schizophrenia/Schizoaffective disorder:No data recorded  Duration of Psychotic Symptoms:No data recorded Hallucinations:No data recorded Ideas of Reference:Delusions; Paranoia  Suicidal Thoughts:No data recorded Homicidal Thoughts:No data recorded  Sensorium  Memory: Immediate Poor; Recent Poor; Remote Poor  Judgment: Poor  Insight: Poor   Executive Functions  Concentration: Fair  Attention Span: Fair  Recall: Poor  Fund of Knowledge: Poor  Language: Poor   Psychomotor Activity  Psychomotor Activity:No data recorded  Assets  Assets: Desire for Improvement; Social Support   Sleep  Sleep:No data recorded   Physical Exam: Physical Exam Vitals and nursing note reviewed.  Constitutional:      Appearance: Normal appearance.  HENT:     Head: Normocephalic and atraumatic.     Mouth/Throat:     Pharynx: Oropharynx is clear.  Eyes:     Pupils: Pupils are equal, round, and reactive to light.  Cardiovascular:     Rate and Rhythm: Normal rate and regular rhythm.  Pulmonary:     Effort: Pulmonary effort is normal.     Breath sounds: Normal breath sounds.  Abdominal:     General: Abdomen is flat.     Palpations: Abdomen is soft.  Musculoskeletal:        General: Normal range of motion.  Skin:    General: Skin is warm and dry.  Neurological:     General: No focal deficit present.     Mental Status: He is alert. Mental status is at  baseline.  Psychiatric:        Attention and Perception: Attention normal.        Mood and Affect: Mood normal. Affect is blunt.        Speech: Speech normal.        Behavior: Behavior is withdrawn.        Thought Content: Thought content normal.        Cognition and Memory: Cognition is impaired.    Review of Systems  Constitutional: Negative.   HENT: Negative.    Eyes: Negative.   Respiratory: Negative.    Cardiovascular: Negative.   Gastrointestinal: Negative.   Musculoskeletal: Negative.   Skin: Negative.   Neurological: Negative.   Psychiatric/Behavioral: Negative.     Blood pressure 114/79, pulse (!) 112, temperature 98.2 F (36.8 C), temperature source Oral, resp. rate 18, height 5\' 9"  (1.753 m), weight 96.4 kg, SpO2 97 %. Body mass index is 31.38 kg/m.   Treatment Plan Summary: Plan completely stable.  No change to treatment.  Still waiting for placement 5/25 Continue Colace Discontinue Pepcid  Reggie Pile, MD 09/14/2022, 8:05 AM

## 2022-09-14 NOTE — Progress Notes (Signed)
Although patient requested to take scheduled Miralax after dinner, he has since declined.

## 2022-09-14 NOTE — Plan of Care (Signed)
D- Patient alert and oriented. Patient presents in a pleasant mood on assessment stating that he slept "ok" last night and had no complaints to voice to this Clinical research associate. Patient denies SI, HI, AVH, and pain at this time. Patient also denies any signs/symptoms of depression and anxiety, stating that overall, "I'm alright". Patient has no stated goals for today.   A- Patient only wants to take scheduled Miralax, and refuses to take the Pepcid and Colace, in which those orders were discontinued after speaking with MD. Patient asked to take his Miralax after dinner. Support and encouragement provided. Routine safety checks conducted every 15 minutes. Patient informed to notify staff with problems or concerns.   R- No adverse drug reactions noted. Patient contracts for safety at this time. Patient compliant with some medications. Patient receptive, calm, and cooperative. Patient isolates to room, except for meals and medication. Patient remains safe at this time.  Problem: Education: Goal: Knowledge of General Education information will improve Description: Including pain rating scale, medication(s)/side effects and non-pharmacologic comfort measures Outcome: Progressing   Problem: Health Behavior/Discharge Planning: Goal: Ability to manage health-related needs will improve Outcome: Progressing   Problem: Clinical Measurements: Goal: Ability to maintain clinical measurements within normal limits will improve Outcome: Progressing Goal: Will remain free from infection Outcome: Progressing Goal: Diagnostic test results will improve Outcome: Progressing Goal: Respiratory complications will improve Outcome: Progressing Goal: Cardiovascular complication will be avoided Outcome: Progressing   Problem: Activity: Goal: Risk for activity intolerance will decrease Outcome: Progressing   Problem: Nutrition: Goal: Adequate nutrition will be maintained Outcome: Progressing   Problem: Coping: Goal: Level  of anxiety will decrease Outcome: Progressing   Problem: Elimination: Goal: Will not experience complications related to urinary retention Outcome: Progressing   Problem: Pain Managment: Goal: General experience of comfort will improve Outcome: Progressing   Problem: Safety: Goal: Ability to remain free from injury will improve Outcome: Progressing   Problem: Skin Integrity: Goal: Risk for impaired skin integrity will decrease Outcome: Progressing   Problem: Education: Goal: Knowledge of [place] General Education information/materials will improve Outcome: Progressing Goal: Emotional status will improve Outcome: Progressing Goal: Mental status will improve Outcome: Progressing Goal: Verbalization of understanding the information provided will improve Outcome: Progressing   Problem: Activity: Goal: Interest or engagement in activities will improve Outcome: Progressing Goal: Sleeping patterns will improve Outcome: Progressing   Problem: Coping: Goal: Ability to verbalize frustrations and anger appropriately will improve Outcome: Progressing Goal: Ability to demonstrate self-control will improve Outcome: Progressing   Problem: Health Behavior/Discharge Planning: Goal: Identification of resources available to assist in meeting health care needs will improve Outcome: Progressing Goal: Compliance with treatment plan for underlying cause of condition will improve Outcome: Progressing   Problem: Physical Regulation: Goal: Ability to maintain clinical measurements within normal limits will improve Outcome: Progressing   Problem: Safety: Goal: Periods of time without injury will increase Outcome: Progressing   Problem: Activity: Goal: Will verbalize the importance of balancing activity with adequate rest periods Outcome: Progressing   Problem: Education: Goal: Will be free of psychotic symptoms Outcome: Progressing Goal: Knowledge of the prescribed therapeutic  regimen will improve Outcome: Progressing   Problem: Coping: Goal: Coping ability will improve Outcome: Progressing Goal: Will verbalize feelings Outcome: Progressing   Problem: Health Behavior/Discharge Planning: Goal: Compliance with prescribed medication regimen will improve Outcome: Progressing   Problem: Nutritional: Goal: Ability to achieve adequate nutritional intake will improve Outcome: Progressing   Problem: Role Relationship: Goal: Ability to communicate needs accurately will  improve Outcome: Progressing Goal: Ability to interact with others will improve Outcome: Progressing   Problem: Safety: Goal: Ability to redirect hostility and anger into socially appropriate behaviors will improve Outcome: Progressing Goal: Ability to remain free from injury will improve Outcome: Progressing   Problem: Self-Care: Goal: Ability to participate in self-care as condition permits will improve Outcome: Progressing   Problem: Self-Concept: Goal: Will verbalize positive feelings about self Outcome: Progressing

## 2022-09-14 NOTE — Group Note (Signed)
[facility] LCSW Group Therapy Note   Group Date: 09/14/2022 Start Time: 1300 End Time: 1400  Type of Therapy and Topic:  Group Therapy:  Feelings around Relapse and Recovery  Participation Level:  Did Not Attend   Mood:  Description of Group:    Patients in this group will discuss emotions they experience before and after a relapse. They will process how experiencing these feelings, or avoidance of experiencing them, relates to having a relapse. Facilitator will guide patients to explore emotions they have related to recovery. Patients will be encouraged to process which emotions are more powerful. They will be guided to discuss the emotional reaction significant others in their lives may have to patients' relapse or recovery. Patients will be assisted in exploring ways to respond to the emotions of others without this contributing to a relapse.  Therapeutic Goals: Patient will identify two or more emotions that lead to relapse for them:  Patient will identify two emotions that result when they relapse:  Patient will identify two emotions related to recovery:  Patient will demonstrate ability to communicate their needs through discussion and/or role plays.   Summary of Patient Progress:  Patient did not attend group despite encouraged participation.     Therapeutic Modalities:   Cognitive Behavioral Therapy Solution-Focused Therapy Assertiveness Training Relapse Prevention Therapy   Estephany Perot W Sereen Schaff, LCSWA 

## 2022-09-15 DIAGNOSIS — F203 Undifferentiated schizophrenia: Secondary | ICD-10-CM | POA: Diagnosis not present

## 2022-09-15 NOTE — Progress Notes (Signed)
Pt denies SI/HI/AVH and verbally agrees to approach staff if these become apparent or before harming themselves/others. Rates depression 0/10. Rates anxiety 0/10. Rates pain 0/10.  Scheduled medications administered to pt, per MD orders. RN provided support and encouragement to pt. Q15 min safety checks implemented and continued. Pt is safe on the unit. Plan of care on going and no other concerns expressed at this time.  09/15/22 0810  Psych Admission Type (Psych Patients Only)  Admission Status Voluntary  Psychosocial Assessment  Patient Complaints None  Eye Contact Avoids  Facial Expression Flat  Affect Blunted  Speech Logical/coherent  Interaction Isolative;Assertive  Motor Activity Other (Comment) (WDL)  Appearance/Hygiene In scrubs  Behavior Characteristics Cooperative;Appropriate to situation;Calm  Mood Pleasant  Aggressive Behavior  Effect No apparent injury  Thought Process  Coherency Circumstantial  Content WDL  Delusions None reported or observed  Perception Hallucinations  Hallucination Auditory  Judgment Impaired  Confusion None  Danger to Self  Current suicidal ideation? Denies  Danger to Others  Danger to Others None reported or observed

## 2022-09-15 NOTE — Group Note (Signed)
Date:  09/15/2022 Time:  6:28 PM  Group Topic/Focus:  Music Therapy     Participation Level:  Minimal  Participation Quality:  Appropriate  Affect:  Appropriate  Cognitive:  Appropriate  Insight: Appropriate  Engagement in Group:  Lacking and Limited  Modes of Intervention:  Socialization  Additional Comments:    Doug Sou 09/15/2022, 6:28 PM

## 2022-09-15 NOTE — Progress Notes (Signed)
[facility] MD Progress Note  09/15/2022 11:04 AM Travis Sandoval  MRN:  [id number] Subjective:  5/26 Patient is seen in his room.  He tends to isolate.  Staff indicate that he has concerns about his bowel movements.  He denies having such concerns today.  He reports eating and sleeping well.  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  Denies homicidal or suicidal thoughts.  Asks when he can be discharged.  5/25 Patient states that he is sleeping and eating sufficiently.  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  Reasons that is why he has microphones in speakers and his ears.  Denies hallucinations and subjective paranoia.  She is at the only stressful thing going on his being here.  Denies all symptoms of depression, anxiety, and irritability.  No new medical problems.  Denies having visitors here.  Denies having any constipation.  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  Principal Problem: Undifferentiated schizophrenia (HCC) Diagnosis: Principal Problem:   Undifferentiated schizophrenia (HCC)  Total Time spent with patient: 21 minutes.  Past Psychiatric History: Past history of schizophrenia  Past Medical History:  Past Medical History:  Diagnosis Date   ADHD    Bipolar 1 disorder (HCC)    Hepatitis C    Schizoaffective disorder (HCC)    History reviewed. No pertinent surgical history. Family History:  Family History  Family history unknown: Yes   Family Psychiatric  History: See previous Social History:  Social History   Substance and Sexual Activity  Alcohol Use Not Currently     Social History   Substance and Sexual Activity  Drug Use Not Currently    Social History   Socioeconomic History   Marital status: Unknown    Spouse name: Not on file   Number of children: Not on file   Years of education: Not on file   Highest  education level: Not on file  Occupational History   Not on file  Tobacco Use   Smoking status: Every Day    Packs/day: 0.25    Years: 15.00    Additional pack years: 0.00    Total pack years: 3.75    Types: Cigarettes   Smokeless tobacco: Not on file  Vaping Use   Vaping Use: Not on file  Substance and Sexual Activity   Alcohol use: Not Currently   Drug use: Not Currently   Sexual activity: Not Currently  Other Topics Concern   Not on file  Social History Narrative   Not on file   Social Determinants of Health   Financial Resource Strain: Not on file  Food Insecurity: Patient Declined (05/18/2022)   Hunger Vital Sign    Worried About Running Out of Food in the Last Year: Patient declined    Ran Out of Food in the Last Year: Patient declined  Transportation Needs: Patient Declined (05/18/2022)   PRAPARE - Administrator, Civil Service (Medical): Patient declined    Lack of Transportation (Non-Medical): Patient declined  Physical Activity: Not on file  Stress: Not on file  Social Connections: Not on file   Additional Social History:                         Sleep: Fair  Appetite:  Fair  Current Medications: Current Facility-Administered Medications  Medication  Dose Route Frequency Provider Last Rate Last Admin   acetaminophen (TYLENOL) tablet 650 mg  650 mg Oral Q6H PRN Gabriel Cirri F, NP   650 mg at 08/28/22 2109   alum & mag hydroxide-simeth (MAALOX/MYLANTA) 200-200-20 MG/5ML suspension 30 mL  30 mL Oral Q4H PRN Gabriel Cirri F, NP   30 mL at 09/14/22 2113   ARIPiprazole ER (ABILIFY MAINTENA) injection 400 mg  400 mg Intramuscular Q28 days [name], [name], MD   400 mg at 08/22/22 1615   cloZAPine (CLOZARIL) tablet 300 mg  300 mg Oral QHS [name], [name], MD   300 mg at 09/14/22 2113   guaiFENesin (ROBITUSSIN) 100 MG/5ML liquid 5 mL  5 mL Oral Q4H PRN [name], [name], MD   5 mL at 08/22/22 2110   hydrOXYzine (ATARAX) tablet 50 mg  50 mg  Oral Q6H PRN [name], [name], MD   50 mg at 08/10/22 2143   magnesium citrate solution 1 Bottle  1 Bottle Oral Daily PRN Jaynie Bream, RPH   1 Bottle at 07/19/22 1643   magnesium hydroxide (MILK OF MAGNESIA) suspension 30 mL  30 mL Oral Daily PRN Jaynie Bream, RPH   30 mL at 09/13/22 2150   mineral oil liquid   Oral Daily PRN Sarina Ill, DO   Given at 07/17/22 1201   nicotine polacrilex (NICORETTE) gum 2 mg  2 mg Oral PRN [name], [name], MD   2 mg at 04/23/22 2200   polyethylene glycol (MIRALAX / GLYCOLAX) packet 17 g  17 g Oral Daily [name], [name], MD   17 g at 09/12/22 1648   senna-docusate (Senokot-S) tablet 2 tablet  2 tablet Oral Daily PRN [name], [name], MD   2 tablet at 07/14/22 0809   ziprasidone (GEODON) injection 20 mg  20 mg Intramuscular Q12H PRN Reggie Pile, MD        Lab Results: No results found for this or any previous visit (from the past 48 hour(s)).  Blood Alcohol level:  Lab Results  Component Value Date   ETH <10 04/15/2022    Metabolic Disorder Labs: Lab Results  Component Value Date   HGBA1C 5.5 04/24/2022   MPG 111 04/24/2022   No results found for: "PROLACTIN" Lab Results  Component Value Date   CHOL 220 (H) 04/24/2022   TRIG 700 (H) 04/24/2022   HDL 35 (L) 04/24/2022   CHOLHDL 6.3 04/24/2022   VLDL UNABLE TO CALCULATE IF TRIGLYCERIDE OVER 400 mg/dL 16/01/9603   LDLCALC UNABLE TO CALCULATE IF TRIGLYCERIDE OVER 400 mg/dL 54/12/8117    Physical Findings: AIMS: Facial and Oral Movements Muscles of Facial Expression: None, normal Lips and Perioral Area: None, normal Jaw: None, normal Tongue: None, normal,Extremity Movements Upper (arms, wrists, hands, fingers): None, normal Lower (legs, knees, ankles, toes): None, normal, Trunk Movements Neck, shoulders, hips: None, normal, Overall Severity Severity of abnormal movements (highest score from questions above): None, normal Incapacitation due to abnormal movements: None,  normal Patient's awareness of abnormal movements (rate only patient's report): No Awareness, Dental Status Current problems with teeth and/or dentures?: No Does patient usually wear dentures?: No  CIWA:    COWS:     Musculoskeletal: Strength & Muscle Tone: within normal limits Gait & Station: normal Patient leans: N/A  Psychiatric Specialty Exam:  Presentation  General Appearance:  Disheveled  Eye Contact: Minimal  Speech: Within normal limits Speech Volume: Normal  Handedness: Right   Mood and Affect  Mood: Fine  Affect: Neutral  Thought Process  Thought Processes: Disorganized  Descriptions of Associations:Loose  Orientation:Partial  Thought Content:Illogical; Scattered  History of Schizophrenia/Schizoaffective disorder:No data recorded Duration of Psychotic Symptoms:No data recorded Hallucinations:No data recorded Ideas of Reference:Delusions; Paranoia  Suicidal Thoughts:No data recorded Homicidal Thoughts:No data recorded  Sensorium  Memory: Immediate Poor; Recent Poor; Remote Poor  Judgment: Poor  Insight: Poor   Executive Functions  Concentration: Fair  Attention Span: Fair  Recall: Poor  Fund of Knowledge: Poor  Language: Poor   Psychomotor Activity  Psychomotor Activity:No data recorded  Assets  Assets: Desire for Improvement; Social Support   Sleep  Sleep:No data recorded   Physical Exam: Physical Exam Vitals and nursing note reviewed.  Constitutional:      Appearance: Normal appearance.  HENT:     Head: Normocephalic and atraumatic.     Mouth/Throat:     Pharynx: Oropharynx is clear.  Eyes:     Pupils: Pupils are equal, round, and reactive to light.  Cardiovascular:     Rate and Rhythm: Normal rate and regular rhythm.  Pulmonary:     Effort: Pulmonary effort is normal.     Breath sounds: Normal breath sounds.  Abdominal:     General: Abdomen is flat.     Palpations: Abdomen is soft.   Musculoskeletal:        General: Normal range of motion.  Skin:    General: Skin is warm and dry.  Neurological:     General: No focal deficit present.     Mental Status: He is alert. Mental status is at baseline.  Psychiatric:        Attention and Perception: Attention normal.        Mood and Affect: Mood normal. Affect is blunt.        Speech: Speech normal.        Behavior: Behavior is withdrawn.        Thought Content: Thought content normal.        Cognition and Memory: Cognition is impaired.    Review of Systems  Constitutional: Negative.   HENT: Negative.    Eyes: Negative.   Respiratory: Negative.    Cardiovascular: Negative.   Gastrointestinal: Negative.   Musculoskeletal: Negative.   Skin: Negative.   Neurological: Negative.   Psychiatric/Behavioral: Negative.     Blood pressure 97/75, pulse 99, temperature 98.1 F (36.7 C), temperature source Oral, resp. rate 18, height 5\' 9"  (1.753 m), weight 96.4 kg, SpO2 97 %. Body mass index is 31.38 kg/m.   Treatment Plan Summary: Plan completely stable.  No change to treatment.  Still waiting for placement 5/26 No changes  5/25 D/c  Colace Discontinue Pepcid   Reggie Pile, MD 09/15/2022, 11:04 AM

## 2022-09-15 NOTE — Plan of Care (Signed)
  Problem: Education: Goal: Knowledge of General Education information will improve Description: Including pain rating scale, medication(s)/side effects and non-pharmacologic comfort measures Outcome: Progressing   Problem: Nutrition: Goal: Adequate nutrition will be maintained Outcome: Progressing   Problem: Coping: Goal: Level of anxiety will decrease Outcome: Progressing   Problem: Activity: Goal: Risk for activity intolerance will decrease Outcome: Not Progressing   

## 2022-09-16 NOTE — Progress Notes (Signed)
   09/16/22 1000  Psych Admission Type (Psych Patients Only)  Admission Status Voluntary  Psychosocial Assessment  Patient Complaints None  Eye Contact Avoids  Facial Expression Flat  Affect Preoccupied  Speech Logical/coherent  Interaction Assertive  Motor Activity Slow  Appearance/Hygiene Unremarkable  Behavior Characteristics Cooperative;Appropriate to situation  Mood Pleasant  Aggressive Behavior  Effect No apparent injury  Thought Process  Coherency Circumstantial  Content WDL  Delusions None reported or observed  Perception WDL  Hallucination None reported or observed  Judgment WDL  Confusion None  Danger to Self  Current suicidal ideation? Denies  Agreement Not to Harm Self Yes  Description of Agreement Verbal  Danger to Others  Danger to Others None reported or observed   Patient remains isolative inside his assigned room with the exception of meal times. Patient allowed this staff member to update his picture in EPIC and was very pleasant. Staff will continue to monitor for changes in condition/behavior.

## 2022-09-16 NOTE — Plan of Care (Signed)
  Problem: Coping: Goal: Level of anxiety will decrease Outcome: Progressing   Problem: Pain Managment: Goal: General experience of comfort will improve Outcome: Progressing   

## 2022-09-16 NOTE — Group Note (Signed)
LCSW Group Therapy Note  Group Date: 09/16/2022 Start Time: 1300 End Time: 1400   Type of Therapy and Topic:  Group Therapy - How To Cope with Nervousness about Discharge   Participation Level:  Did Not Attend   Description of Group This process group involved identification of patients' feelings about discharge. Some of them are scheduled to be discharged soon, while others are new admissions, but each of them was asked to share thoughts and feelings surrounding discharge from the hospital. One common theme was that they are excited at the prospect of going home, while another was that many of them are apprehensive about sharing why they were hospitalized. Patients were given the opportunity to discuss these feelings with their peers in preparation for discharge.  Therapeutic Goals  Patient will identify their overall feelings about pending discharge. Patient will think about how they might proactively address issues that they believe will once again arise once they get home (i.e. with parents). Patients will participate in discussion about having hope for change.   Summary of Patient Progress:   Patient did not attend group despite encouraged participation.     Therapeutic Modalities Cognitive Behavioral Therapy   Almedia Balls 09/16/2022  10:45 PM

## 2022-09-16 NOTE — Group Note (Signed)
Date:  09/16/2022 Time:  8:43 PM  Group Topic/Focus:  Wrap-Up Group:   The focus of this group is to help patients review their daily goal of treatment and discuss progress on daily workbooks.    Participation Level:  Active  Participation Quality:  Appropriate and Attentive  Affect:  Appropriate  Cognitive:  Appropriate  Insight: Appropriate  Engagement in Group:  Improving  Modes of Intervention:  Clarification, Discussion, Education, Socialization, and Support  Additional Comments:     Nannette Zill 09/16/2022, 8:43 PM

## 2022-09-16 NOTE — Group Note (Signed)
Date:  09/16/2022 Time:  3:47 PM  Group Topic/Focus:  Activity Group    Participation Level:  Active  Participation Quality:  Appropriate  Affect:  Appropriate  Cognitive:  Appropriate  Insight: Improving  Engagement in Group:  Engaged  Modes of Intervention:  Activity  Additional Comments:  Serapio enjoys when we go outside.   Mary Sella Pritika Alvarez 09/16/2022, 3:47 PM

## 2022-09-17 DIAGNOSIS — F203 Undifferentiated schizophrenia: Secondary | ICD-10-CM | POA: Diagnosis not present

## 2022-09-17 NOTE — Group Note (Signed)
Date:  09/17/2022 Time:  10:05 AM  Group Topic/Focus:  Goals Group:   The focus of this group is to help patients establish daily goals to achieve during treatment and discuss how the patient can incorporate goal setting into their daily lives to aide in recovery.    Participation Level:  Did Not Attend   Sanae Willetts Travis Phillippa Straub 09/17/2022, 10:05 AM  

## 2022-09-17 NOTE — Progress Notes (Signed)
D - Patient isolative for the majority of the evening, but attended groups 9did not verbally participate).  Mood was preoccupied with a flat affect.  Siva remains preoccupied with internal thoughts.  No unsafe behaviors observed.         A - Medications were accepted as ordered.  Safety monitoring and occasional interpersonal interactions provided.   R - Patient denied thoughts of self harm.  Delusional thoughts remain.  No significant changes to report.

## 2022-09-17 NOTE — Group Note (Signed)
Date:  09/17/2022 Time:  9:00 PM  Group Topic/Focus:  Wrap-Up Group:   The focus of this group is to help patients review their daily goal of treatment and discuss progress on daily workbooks.    Participation Level:  Active  Participation Quality:  Appropriate and Attentive  Affect:  Appropriate  Cognitive:  Alert and Appropriate  Insight: Good  Engagement in Group:  Developing/Improving  Modes of Intervention:  Discussion  Additional Comments:     Lamisha Roussell 09/17/2022, 9:00 PM

## 2022-09-17 NOTE — Group Note (Signed)
Date:  09/17/2022 Time:  5:39 PM  Group Topic/Focus:  Activity Group    Participation Level:  Active  Participation Quality:  Appropriate  Affect:  Appropriate  Cognitive:  Appropriate  Insight: Good  Engagement in Group:  Engaged  Modes of Intervention:  Activity  Additional Comments:    Marcques Wrightsman M Shawntel Farnworth 09/17/2022, 5:39 PM  

## 2022-09-17 NOTE — Progress Notes (Signed)
[facility] MD Progress Note  09/17/2022 2:55 PM Travis Sandoval  MRN:  [id number] Subjective: Follow-up 42 year old man with schizophrenia.  No new complaints.  Behavior exactly the same as always Principal Problem: Undifferentiated schizophrenia (HCC) Diagnosis: Principal Problem:   Undifferentiated schizophrenia (HCC)  Total Time spent with patient: 20 minutes  Past Psychiatric History: Past history of schizophrenia  Past Medical History:  Past Medical History:  Diagnosis Date   ADHD    Bipolar 1 disorder (HCC)    Hepatitis C    Schizoaffective disorder (HCC)    History reviewed. No pertinent surgical history. Family History:  Family History  Family history unknown: Yes   Family Psychiatric  History: See previous Social History:  Social History   Substance and Sexual Activity  Alcohol Use Not Currently     Social History   Substance and Sexual Activity  Drug Use Not Currently    Social History   Socioeconomic History   Marital status: Unknown    Spouse name: Not on file   Number of children: Not on file   Years of education: Not on file   Highest education level: Not on file  Occupational History   Not on file  Tobacco Use   Smoking status: Every Day    Packs/day: 0.25    Years: 15.00    Additional pack years: 0.00    Total pack years: 3.75    Types: Cigarettes   Smokeless tobacco: Not on file  Vaping Use   Vaping Use: Not on file  Substance and Sexual Activity   Alcohol use: Not Currently   Drug use: Not Currently   Sexual activity: Not Currently  Other Topics Concern   Not on file  Social History Narrative   Not on file   Social Determinants of Health   Financial Resource Strain: Not on file  Food Insecurity: Patient Declined (05/18/2022)   Hunger Vital Sign    Worried About Running Out of Food in the Last Year: Patient declined    Ran Out of Food in the Last Year: Patient declined  Transportation Needs: Patient Declined (05/18/2022)   PRAPARE -  Administrator, Civil Service (Medical): Patient declined    Lack of Transportation (Non-Medical): Patient declined  Physical Activity: Not on file  Stress: Not on file  Social Connections: Not on file   Additional Social History:                         Sleep: Fair  Appetite:  Fair  Current Medications: Current Facility-Administered Medications  Medication Dose Route Frequency Provider Last Rate Last Admin   acetaminophen (TYLENOL) tablet 650 mg  650 mg Oral Q6H PRN Gabriel Cirri F, NP   650 mg at 08/28/22 2109   alum & mag hydroxide-simeth (MAALOX/MYLANTA) 200-200-20 MG/5ML suspension 30 mL  30 mL Oral Q4H PRN Gabriel Cirri F, NP   30 mL at 09/16/22 2158   ARIPiprazole ER (ABILIFY MAINTENA) injection 400 mg  400 mg Intramuscular Q28 days Gery Sabedra T, MD   400 mg at 08/22/22 1615   cloZAPine (CLOZARIL) tablet 300 mg  300 mg Oral QHS Deola Rewis T, MD   300 mg at 09/16/22 2158   guaiFENesin (ROBITUSSIN) 100 MG/5ML liquid 5 mL  5 mL Oral Q4H PRN Aloni Chuang, [name], MD   5 mL at 08/22/22 2110   hydrOXYzine (ATARAX) tablet 50 mg  50 mg Oral Q6H PRN Rhyder Koegel, [name], MD  50 mg at 08/10/22 2143   magnesium citrate solution 1 Bottle  1 Bottle Oral Daily PRN Jaynie Bream, RPH   1 Bottle at 07/19/22 1643   magnesium hydroxide (MILK OF MAGNESIA) suspension 30 mL  30 mL Oral Daily PRN Jaynie Bream, RPH   30 mL at 09/15/22 2151   mineral oil liquid   Oral Daily PRN Sarina Ill, DO   Given at 07/17/22 1201   nicotine polacrilex (NICORETTE) gum 2 mg  2 mg Oral PRN Gretta Samons, [name], MD   2 mg at 04/23/22 2200   polyethylene glycol (MIRALAX / GLYCOLAX) packet 17 g  17 g Oral Daily Diangelo Radel, [name], MD   17 g at 09/12/22 1648   senna-docusate (Senokot-S) tablet 2 tablet  2 tablet Oral Daily PRN Bannon Giammarco, [name], MD   2 tablet at 07/14/22 0809   ziprasidone (GEODON) injection 20 mg  20 mg Intramuscular Q12H PRN Reggie Pile, MD        Lab Results: No  results found for this or any previous visit (from the past 48 hour(s)).  Blood Alcohol level:  Lab Results  Component Value Date   ETH <10 04/15/2022    Metabolic Disorder Labs: Lab Results  Component Value Date   HGBA1C 5.5 04/24/2022   MPG 111 04/24/2022   No results found for: "PROLACTIN" Lab Results  Component Value Date   CHOL 220 (H) 04/24/2022   TRIG 700 (H) 04/24/2022   HDL 35 (L) 04/24/2022   CHOLHDL 6.3 04/24/2022   VLDL UNABLE TO CALCULATE IF TRIGLYCERIDE OVER 400 mg/dL 16/01/9603   LDLCALC UNABLE TO CALCULATE IF TRIGLYCERIDE OVER 400 mg/dL 54/12/8117    Physical Findings: AIMS: Facial and Oral Movements Muscles of Facial Expression: None, normal Lips and Perioral Area: None, normal Jaw: None, normal Tongue: None, normal,Extremity Movements Upper (arms, wrists, hands, fingers): None, normal Lower (legs, knees, ankles, toes): None, normal, Trunk Movements Neck, shoulders, hips: None, normal, Overall Severity Severity of abnormal movements (highest score from questions above): None, normal Incapacitation due to abnormal movements: None, normal Patient's awareness of abnormal movements (rate only patient's report): No Awareness, Dental Status Current problems with teeth and/or dentures?: No Does patient usually wear dentures?: No  CIWA:    COWS:     Musculoskeletal: Strength & Muscle Tone: within normal limits Gait & Station: normal Patient leans: N/A  Psychiatric Specialty Exam:  Presentation  General Appearance:  Disheveled  Eye Contact: Minimal  Speech: Pressured  Speech Volume: Normal  Handedness: Right   Mood and Affect  Mood: Euphoric; Irritable  Affect: Inappropriate   Thought Process  Thought Processes: Disorganized  Descriptions of Associations:Loose  Orientation:Partial  Thought Content:Illogical; Scattered  History of Schizophrenia/Schizoaffective disorder:No data recorded Duration of Psychotic Symptoms:No data  recorded Hallucinations:No data recorded Ideas of Reference:Delusions; Paranoia  Suicidal Thoughts:No data recorded Homicidal Thoughts:No data recorded  Sensorium  Memory: Immediate Poor; Recent Poor; Remote Poor  Judgment: Poor  Insight: Poor   Executive Functions  Concentration: Fair  Attention Span: Fair  Recall: Poor  Fund of Knowledge: Poor  Language: Poor   Psychomotor Activity  Psychomotor Activity:No data recorded  Assets  Assets: Desire for Improvement; Social Support   Sleep  Sleep:No data recorded   Physical Exam: Physical Exam Vitals and nursing note reviewed.  Constitutional:      Appearance: Normal appearance.  HENT:     Head: Normocephalic and atraumatic.     Mouth/Throat:     Pharynx: Oropharynx is  clear.  Eyes:     Pupils: Pupils are equal, round, and reactive to light.  Cardiovascular:     Rate and Rhythm: Normal rate and regular rhythm.  Pulmonary:     Effort: Pulmonary effort is normal.     Breath sounds: Normal breath sounds.  Abdominal:     General: Abdomen is flat.     Palpations: Abdomen is soft.  Musculoskeletal:        General: Normal range of motion.  Skin:    General: Skin is warm and dry.  Neurological:     General: No focal deficit present.     Mental Status: He is alert. Mental status is at baseline.  Psychiatric:        Attention and Perception: He is inattentive.        Mood and Affect: Mood normal. Affect is blunt.        Speech: Speech is delayed.        Behavior: Behavior is slowed.        Thought Content: Thought content normal.    Review of Systems  Constitutional: Negative.   HENT: Negative.    Eyes: Negative.   Respiratory: Negative.    Cardiovascular: Negative.   Gastrointestinal: Negative.   Musculoskeletal: Negative.   Skin: Negative.   Neurological: Negative.   Psychiatric/Behavioral: Negative.     Blood pressure 108/72, pulse 84, temperature 97.7 F (36.5 C), temperature source  Oral, resp. rate 19, height 5\' 9"  (1.753 m), weight 96.4 kg, SpO2 97 %. Body mass index is 31.38 kg/m.   Treatment Plan Summary: Plan no change to medication or treatment plan.  Continue to focus on discharge planning  Mordecai Rasmussen, MD 09/17/2022, 2:55 PM

## 2022-09-17 NOTE — Progress Notes (Addendum)
I assumed care for Mr Travis Sandoval at about 08:00, he was resting in his bed,later seen after breakfast. He declined his scheduled miralax dose, vital signs from this am wnl,he has remained self isolative to his room all this am except for meals. He denied any avh/hi/si, hygiene remains poor. He is being monitored as ordered.    No changes in condition from baseline, he showered this evening. He has otherwise remained self isolative to his room all shift. He is being monitored as ordered.

## 2022-09-17 NOTE — Group Note (Signed)
Recreation Therapy Group Note   Group Topic:Goal Setting  Group Date: 09/17/2022 Start Time: 1000 End Time: 1105 Facilitators: Rosina Lowenstein, LRT, CTRS Location:  Craft Room  Group Description: Vision Board. Patients were given many different magazines, a glue stick, markers, and a piece of cardstock paper. LRT and pts discussed the importance of having goals in life. LRT and pts discussed the difference between short-term and long-term goals, as well as what a SMART goal is. LRT encouraged pts to create a vision board, with images they picked and then cut out with safety scissors from the magazine, for themselves, that capture their short and long-term goals. LRT encouraged pts to show and explain their vision board to the group. LRT offered to laminate vision board once dry and complete.   Goal Area(s) Addressed:  Patient will gain knowledge of short vs. long term goals.  Patient will identify goals for themselves. Patient will practice setting SMART goals. Patient will verbalize their goals to LRT and peers.   Affect/Mood: N/A   Participation Level: Did not attend    Clinical Observations/Individualized Feedback: Alessio did not attend group due to resting in his room.  Plan: Continue to engage patient in RT group sessions 2-3x/week.   Rosina Lowenstein, LRT, CTRS 09/17/2022 11:43 AM

## 2022-09-17 NOTE — Group Note (Signed)
[facility] LCSW Group Therapy Note   Group Date: 09/17/2022 Start Time: 1310 End Time: 1420  Type of Therapy/Topic:  Group Therapy:  Feelings about Diagnosis  Participation Level:  Did Not Attend     Description of Group:    This group will allow patients to explore their thoughts and feelings about diagnoses they have received. Patients will be guided to explore their level of understanding and acceptance of these diagnoses. Facilitator will encourage patients to process their thoughts and feelings about the reactions of others to their diagnosis, and will guide patients in identifying ways to discuss their diagnosis with significant others in their lives. This group will be process-oriented, with patients participating in exploration of their own experiences as well as giving and receiving support and challenge from other group members.   Therapeutic Goals: 1. Patient will demonstrate understanding of diagnosis as evidence by identifying two or more symptoms of the disorder:  2. Patient will be able to express two feelings regarding the diagnosis 3. Patient will demonstrate ability to communicate their needs through discussion and/or role plays  Summary of Patient Progress: X   Therapeutic Modalities:   Cognitive Behavioral Therapy Brief Therapy Feelings Identification    Somtochukwu Woollard R Simora Dingee, LCSW 

## 2022-09-17 NOTE — Progress Notes (Signed)
Patient isolative to self and room. Denies Si, Hi, AVH. Minimal interaction with staff, no interaction with peers. Medication compliant. No concerns or complaints voiced. Stays in room, comes out for meals and meds. Encouragement and support provided. Safety checks maintained. Medication given as prescribed. Pt receptive and remains safe on unit with q 15 min checks.

## 2022-09-18 DIAGNOSIS — F203 Undifferentiated schizophrenia: Secondary | ICD-10-CM | POA: Diagnosis not present

## 2022-09-18 LAB — CBC WITH DIFFERENTIAL/PLATELET
Abs Immature Granulocytes: 0.02 10*3/uL (ref 0.00–0.07)
Basophils Absolute: 0.1 10*3/uL (ref 0.0–0.1)
Basophils Relative: 1 %
Eosinophils Absolute: 0.2 10*3/uL (ref 0.0–0.5)
Eosinophils Relative: 3 %
HCT: 44.2 % (ref 39.0–52.0)
Hemoglobin: 14.5 g/dL (ref 13.0–17.0)
Immature Granulocytes: 0 %
Lymphocytes Relative: 44 %
Lymphs Abs: 3 10*3/uL (ref 0.7–4.0)
MCH: 28.2 pg (ref 26.0–34.0)
MCHC: 32.8 g/dL (ref 30.0–36.0)
MCV: 85.8 fL (ref 80.0–100.0)
Monocytes Absolute: 0.4 10*3/uL (ref 0.1–1.0)
Monocytes Relative: 6 %
Neutro Abs: 3.1 10*3/uL (ref 1.7–7.7)
Neutrophils Relative %: 46 %
Platelets: 168 10*3/uL (ref 150–400)
RBC: 5.15 MIL/uL (ref 4.22–5.81)
RDW: 13.9 % (ref 11.5–15.5)
WBC: 6.7 10*3/uL (ref 4.0–10.5)
nRBC: 0 % (ref 0.0–0.2)

## 2022-09-18 NOTE — Consult Note (Signed)
Pharmacy Consult - Clozapine     42 yo male  This patient's order has been reviewed for prescribing contraindications.    Clozapine [facility] enrollment Verified: yes on 04/17/22 [facility] patient ID: [id number] Current Outpatient Monitoring: Every 2 weeks   Home Regimen:  225 mg po BID Last dose: unknown   Dose Adjustments This Admission: Dose started at clozapine 100 mg po daily Dose is currently 300mg  qHS changed 08/04/2022   Labs: Date    ANC    Submitted? 12/27 2800 Yes 01/03 3500 Yes 01/10   3100 Yes 01/24 3000 Yes 01/31 4400 yes   02/07 4300 yes 02/14 4500 Yes 02/21 5500 Yes 02/28 4080 Yes 03/06 4300 Yes  03/13 4100 Yes 03/20 4800 Yes 03/27 4800 Yes 04/03 4200 Yes 04/10 5400 Yes 04/17 3570 Yes 04/24 4000 Yes 05/01 4200 0508 2900 Yes 05/15 3400  Yes 05/22 3000 Yes 05/29 3082 Yes  Plan: Continue clozapine 300 mg po HS Monitor ANC at least weekly while inpatient. Next CBC with differential on June/5/24.   Zellie Jenning Rodriguez-Guzman PharmD, BCPS 09/18/2022 10:10 AM

## 2022-09-18 NOTE — Plan of Care (Signed)
  Problem: Nutrition: Goal: Adequate nutrition will be maintained Outcome: Progressing   Problem: Coping: Goal: Level of anxiety will decrease Outcome: Progressing   Problem: Safety: Goal: Ability to remain free from injury will improve Outcome: Progressing   Problem: Education: Goal: Mental status will improve Outcome: Progressing

## 2022-09-18 NOTE — Group Note (Signed)
Recreation Therapy Group Note   Group Topic:Relaxation  Group Date: 09/18/2022 Start Time: 1000 End Time: 1045 Facilitators: Rosina Lowenstein, LRT, CTRS Location:  [facility]  Group Description: Meditation. LRT asks patients their current level of stress/anxiety from 1-10, with 10 being the highest. LRT educated on the benefits of mindfulness and how it can apply to everyday life post-discharge. LRT and pt's followed along to an audio script of a "guided meditation" video. LRT asked pt their level of stress and anxiety once the prompt was finished. LRT facilitated post-activity processing to gain feedback on session.  Goal Area(s) Addressed:  Patient will practice using relaxation technique. Patient will identify a new coping skill.  Patient will follow multistep directions to reduce anxiety and stress.  Affect/Mood: N/A   Participation Level: Did not attend    Clinical Observations/Individualized Feedback: Keeley did not attend group due to resting in his room.  Plan: Continue to engage patient in RT group sessions 2-3x/week.   Rosina Lowenstein, LRT, CTRS 09/18/2022 11:02 AM

## 2022-09-18 NOTE — Group Note (Signed)
Date:  09/18/2022 Time:  5:42 PM  Group Topic/Focus:  Outdoor Recreation/Activity    Participation Level:  Active  Participation Quality:  Appropriate  Affect:  Appropriate  Cognitive:  Appropriate  Insight: Appropriate  Engagement in Group:  Engaged  Modes of Intervention:  Activity  Additional Comments:    Wilford Corner 09/18/2022, 5:42 PM

## 2022-09-18 NOTE — Progress Notes (Signed)
[facility] MD Progress Note  09/18/2022 11:56 AM Travis Sandoval  MRN:  [id number] Subjective: Follow-up patient with schizophrenia.  No change to presentation.  Mostly stays in his room but will come out for meals and medication.  Chats politely with me when I come in.  No new complaints. Principal Problem: Undifferentiated schizophrenia (HCC) Diagnosis: Principal Problem:   Undifferentiated schizophrenia (HCC)  Total Time spent with patient: 30 minutes  Past Psychiatric History: Past history of schizophrenia  Past Medical History:  Past Medical History:  Diagnosis Date   ADHD    Bipolar 1 disorder (HCC)    Hepatitis C    Schizoaffective disorder (HCC)    History reviewed. No pertinent surgical history. Family History:  Family History  Family history unknown: Yes   Family Psychiatric  History: See previous Social History:  Social History   Substance and Sexual Activity  Alcohol Use Not Currently     Social History   Substance and Sexual Activity  Drug Use Not Currently    Social History   Socioeconomic History   Marital status: Unknown    Spouse name: Not on file   Number of children: Not on file   Years of education: Not on file   Highest education level: Not on file  Occupational History   Not on file  Tobacco Use   Smoking status: Every Day    Packs/day: 0.25    Years: 15.00    Additional pack years: 0.00    Total pack years: 3.75    Types: Cigarettes   Smokeless tobacco: Not on file  Vaping Use   Vaping Use: Not on file  Substance and Sexual Activity   Alcohol use: Not Currently   Drug use: Not Currently   Sexual activity: Not Currently  Other Topics Concern   Not on file  Social History Narrative   Not on file   Social Determinants of Health   Financial Resource Strain: Not on file  Food Insecurity: Patient Declined (05/18/2022)   Hunger Vital Sign    Worried About Running Out of Food in the Last Year: Patient declined    Ran Out of Food in the Last  Year: Patient declined  Transportation Needs: Patient Declined (05/18/2022)   PRAPARE - Administrator, Civil Service (Medical): Patient declined    Lack of Transportation (Non-Medical): Patient declined  Physical Activity: Not on file  Stress: Not on file  Social Connections: Not on file   Additional Social History:                         Sleep: Fair  Appetite:  Fair  Current Medications: Current Facility-Administered Medications  Medication Dose Route Frequency Provider Last Rate Last Admin   acetaminophen (TYLENOL) tablet 650 mg  650 mg Oral Q6H PRN Gabriel Cirri F, NP   650 mg at 08/28/22 2109   alum & mag hydroxide-simeth (MAALOX/MYLANTA) 200-200-20 MG/5ML suspension 30 mL  30 mL Oral Q4H PRN Gabriel Cirri F, NP   30 mL at 09/17/22 2054   ARIPiprazole ER (ABILIFY MAINTENA) injection 400 mg  400 mg Intramuscular Q28 days Tyshae Stair T, MD   400 mg at 08/22/22 1615   cloZAPine (CLOZARIL) tablet 300 mg  300 mg Oral QHS Cherrill Scrima T, MD   300 mg at 09/17/22 2054   guaiFENesin (ROBITUSSIN) 100 MG/5ML liquid 5 mL  5 mL Oral Q4H PRN Giordan Fordham, [name], MD   5 mL at 08/22/22  2110   hydrOXYzine (ATARAX) tablet 50 mg  50 mg Oral Q6H PRN Janesa Dockery, [name], MD   50 mg at 08/10/22 2143   magnesium citrate solution 1 Bottle  1 Bottle Oral Daily PRN Jaynie Bream, RPH   1 Bottle at 07/19/22 1643   magnesium hydroxide (MILK OF MAGNESIA) suspension 30 mL  30 mL Oral Daily PRN Jaynie Bream, RPH   30 mL at 09/15/22 2151   mineral oil liquid   Oral Daily PRN Sarina Ill, DO   Given at 07/17/22 1201   nicotine polacrilex (NICORETTE) gum 2 mg  2 mg Oral PRN Kahley Leib, [name], MD   2 mg at 04/23/22 2200   polyethylene glycol (MIRALAX / GLYCOLAX) packet 17 g  17 g Oral Daily Hajar Penninger, [name], MD   17 g at 09/12/22 1648   senna-docusate (Senokot-S) tablet 2 tablet  2 tablet Oral Daily PRN Kensington Rios, [name], MD   2 tablet at 07/14/22 0809   ziprasidone (GEODON)  injection 20 mg  20 mg Intramuscular Q12H PRN Reggie Pile, MD        Lab Results:  Results for orders placed or performed during the hospital encounter of 04/16/22 (from the past 48 hour(s))  CBC with Differential/Platelet     Status: None   Collection Time: 09/18/22  9:10 AM  Result Value Ref Range   WBC 6.7 4.0 - 10.5 K/uL   RBC 5.15 4.22 - 5.81 MIL/uL   Hemoglobin 14.5 13.0 - 17.0 g/dL   HCT 11.9 14.7 - 82.9 %   MCV 85.8 80.0 - 100.0 fL   MCH 28.2 26.0 - 34.0 pg   MCHC 32.8 30.0 - 36.0 g/dL   RDW 56.2 13.0 - 86.5 %   Platelets 168 150 - 400 K/uL   nRBC 0.0 0.0 - 0.2 %   Neutrophils Relative % 46 %   Neutro Abs 3.1 1.7 - 7.7 K/uL   Lymphocytes Relative 44 %   Lymphs Abs 3.0 0.7 - 4.0 K/uL   Monocytes Relative 6 %   Monocytes Absolute 0.4 0.1 - 1.0 K/uL   Eosinophils Relative 3 %   Eosinophils Absolute 0.2 0.0 - 0.5 K/uL   Basophils Relative 1 %   Basophils Absolute 0.1 0.0 - 0.1 K/uL   Immature Granulocytes 0 %   Abs Immature Granulocytes 0.02 0.00 - 0.07 K/uL    Comment: Performed at [facility], [address] [place], [place] [address]    Blood Alcohol level:  Lab Results  Component Value Date   [facility] <10 04/15/2022    Metabolic Disorder Labs: Lab Results  Component Value Date   HGBA1C 5.5 04/24/2022   MPG 111 04/24/2022   No results found for: "PROLACTIN" Lab Results  Component Value Date   CHOL 220 (H) 04/24/2022   TRIG 700 (H) 04/24/2022   HDL 35 (L) 04/24/2022   CHOLHDL 6.3 04/24/2022   VLDL UNABLE TO CALCULATE IF TRIGLYCERIDE OVER 400 mg/dL 62/95/2841   LDLCALC UNABLE TO CALCULATE IF TRIGLYCERIDE OVER 400 mg/dL 32/44/0102    Physical Findings: AIMS: Facial and Oral Movements Muscles of Facial Expression: None, normal Lips and Perioral Area: None, normal Jaw: None, normal Tongue: None, normal,Extremity Movements Upper (arms, wrists, hands, fingers): None, normal Lower (legs, knees, ankles, toes): None, normal, Trunk Movements Neck,  shoulders, hips: None, normal, Overall Severity Severity of abnormal movements (highest score from questions above): None, normal Incapacitation due to abnormal movements: None, normal Patient's awareness of abnormal  movements (rate only patient's report): No Awareness, Dental Status Current problems with teeth and/or dentures?: No Does patient usually wear dentures?: No  CIWA:    COWS:     Musculoskeletal: Strength & Muscle Tone: within normal limits Gait & Station: normal Patient leans: N/A  Psychiatric Specialty Exam:  Presentation  General Appearance:  Disheveled  Eye Contact: Minimal  Speech: Pressured  Speech Volume: Normal  Handedness: Right   Mood and Affect  Mood: Euphoric; Irritable  Affect: Inappropriate   Thought Process  Thought Processes: Disorganized  Descriptions of Associations:Loose  Orientation:Partial  Thought Content:Illogical; Scattered  History of Schizophrenia/Schizoaffective disorder:No data recorded Duration of Psychotic Symptoms:No data recorded Hallucinations:No data recorded Ideas of Reference:Delusions; Paranoia  Suicidal Thoughts:No data recorded Homicidal Thoughts:No data recorded  Sensorium  Memory: Immediate Poor; Recent Poor; Remote Poor  Judgment: Poor  Insight: Poor   Executive Functions  Concentration: Fair  Attention Span: Fair  Recall: Poor  Fund of Knowledge: Poor  Language: Poor   Psychomotor Activity  Psychomotor Activity:No data recorded  Assets  Assets: Desire for Improvement; Social Support   Sleep  Sleep:No data recorded   Physical Exam: Physical Exam Vitals and nursing note reviewed.  Constitutional:      Appearance: Normal appearance.  HENT:     Head: Normocephalic and atraumatic.     Mouth/Throat:     Pharynx: Oropharynx is clear.  Eyes:     Pupils: Pupils are equal, round, and reactive to light.  Cardiovascular:     Rate and Rhythm: Normal rate and regular  rhythm.  Pulmonary:     Effort: Pulmonary effort is normal.     Breath sounds: Normal breath sounds.  Abdominal:     General: Abdomen is flat.     Palpations: Abdomen is soft.  Musculoskeletal:        General: Normal range of motion.  Skin:    General: Skin is warm and dry.  Neurological:     General: No focal deficit present.     Mental Status: He is alert. Mental status is at baseline.  Psychiatric:        Attention and Perception: Attention normal.        Mood and Affect: Mood normal. Affect is blunt.        Speech: Speech normal.        Behavior: Behavior is withdrawn.        Thought Content: Thought content normal.    Review of Systems  Constitutional: Negative.   HENT: Negative.    Eyes: Negative.   Respiratory: Negative.    Cardiovascular: Negative.   Gastrointestinal: Negative.   Musculoskeletal: Negative.   Skin: Negative.   Neurological: Negative.   Psychiatric/Behavioral: Negative.     Blood pressure 114/78, pulse 91, temperature 97.6 F (36.4 C), temperature source Oral, resp. rate 18, height 5\' 9"  (1.753 m), weight 96.4 kg, SpO2 97 %. Body mass index is 31.38 kg/m.   Treatment Plan Summary: Plan no change to treatment plan.  Continue medication.  Continue to focus on possible discharge plans.  Mordecai Rasmussen, MD 09/18/2022, 11:56 AM

## 2022-09-18 NOTE — Group Note (Signed)
Date:  09/18/2022 Time:  10:13 AM  Group Topic/Focus:  Goals Group:   The focus of this group is to help patients establish daily goals to achieve during treatment and discuss how the patient can incorporate goal setting into their daily lives to aide in recovery.    Participation Level:  Did Not Attend   [name] 09/18/2022, 10:13 AM

## 2022-09-18 NOTE — Progress Notes (Signed)
Unremarkable night, voiced no concerns or complaints. Denies SI, Hi, AVh. Out for snack and group. Encouragement and support provided. Safety checks maintained. Medications given as prescribed. Pt receptive and remains safe on unit with q 15 min checks.

## 2022-09-18 NOTE — Group Note (Signed)
Date:  09/18/2022 Time:  9:38 PM  Group Topic/Focus:  Identifying Needs:   The focus of this group is to help patients identify their personal needs that have been historically problematic and identify healthy behaviors to address their needs.    Participation Level:  Active  Participation Quality:  Appropriate and Attentive  Affect:  Appropriate  Cognitive:  Alert and Appropriate  Insight: Appropriate, Good, and Improving  Engagement in Group:  Developing/Improving and Engaged  Modes of Intervention:  Discussion, Education, Exploration, Rapport Building, Dance movement psychotherapist, Socialization, and Support  Additional Comments:     [name] 09/18/2022, 9:38 PM

## 2022-09-18 NOTE — Progress Notes (Signed)
   09/18/22 1000  Psych Admission Type (Psych Patients Only)  Admission Status Voluntary  Psychosocial Assessment  Patient Complaints None  Eye Contact Avoids  Facial Expression Flat  Affect Blunted  Speech Soft  Interaction Isolative  Motor Activity Slow  Appearance/Hygiene Disheveled  Behavior Characteristics Calm  Mood Anxious  Aggressive Behavior  Effect No apparent injury  Thought Process  Coherency Circumstantial  Content WDL  Delusions None reported or observed  Perception WDL  Hallucination None reported or observed  Judgment WDL  Confusion None  Danger to Self  Current suicidal ideation? Denies  Agreement Not to Harm Self Yes  Danger to Others  Danger to Others None reported or observed   Denies SI, HI, AVH and pain when assessed. Guarded, isolative to his room majority of this shift. Verbalized concerns related to d/c "I'm just ready to fine somewhere to go. I'm ready to leave". Safety checks maintained at Q 15 minutes intervals without incident. Emotional support, encouragement and reassurance offered. Pt tolerated meals, fluids and scheduled medication well. Off unit to courtyard with peers, returned without issues.

## 2022-09-18 NOTE — Group Note (Signed)
[facility] LCSW Group Therapy Note   Group Date: 09/18/2022 Start Time: 1310 End Time: 1428   Type of Therapy/Topic:  Group Therapy:  Emotion Regulation  Participation Level:  Did Not Attend    Description of Group:    The purpose of this group is to assist patients in learning to regulate negative emotions and experience positive emotions. Patients will be guided to discuss ways in which they have been vulnerable to their negative emotions. These vulnerabilities will be juxtaposed with experiences of positive emotions or situations, and patients challenged to use positive emotions to combat negative ones. Special emphasis will be placed on coping with negative emotions in conflict situations, and patients will process healthy conflict resolution skills.  Therapeutic Goals: Patient will identify two positive emotions or experiences to reflect on in order to balance out negative emotions:  Patient will label two or more emotions that they find the most difficult to experience:  Patient will be able to demonstrate positive conflict resolution skills through discussion or role plays:   Summary of Patient Progress: X   Therapeutic Modalities:   Cognitive Behavioral Therapy Feelings Identification Dialectical Behavioral Therapy   Travis Sandoval R Myrta Mercer, LCSW 

## 2022-09-19 DIAGNOSIS — F203 Undifferentiated schizophrenia: Secondary | ICD-10-CM | POA: Diagnosis not present

## 2022-09-19 NOTE — Progress Notes (Signed)
Patient pleasant and cooperative. Hopefull to get in this group home he interviewed with today. Denies SI, HI, AVH. Medication compliant. Prn given for heartburn. No complaints or concerns voiced. Isolative to self and room. Encouragement and support provided. Safety checks maintained. Medications given as prescribed. Pt receptive and remains safe on unit with 15 min checks.

## 2022-09-19 NOTE — Group Note (Signed)
[facility] LCSW Group Therapy Note   Group Date: 09/19/2022 Start Time: 1315 End Time: 1420   Type of Therapy/Topic:  Group Therapy:  Balance in Life  Participation Level:  Did Not Attend   Description of Group:    This group will address the concept of balance and how it feels and looks when one is unbalanced. Patients will be encouraged to process areas in their lives that are out of balance, and identify reasons for remaining unbalanced. Facilitators will guide patients utilizing problem- solving interventions to address and correct the stressor making their life unbalanced. Understanding and applying boundaries will be explored and addressed for obtaining  and maintaining a balanced life. Patients will be encouraged to explore ways to assertively make their unbalanced needs known to significant others in their lives, using other group members and facilitator for support and feedback.  Therapeutic Goals: Patient will identify two or more emotions or situations they have that consume much of in their lives. Patient will identify signs/triggers that life has become out of balance:  Patient will identify two ways to set boundaries in order to achieve balance in their lives:  Patient will demonstrate ability to communicate their needs through discussion and/or role plays  Summary of Patient Progress: X   Therapeutic Modalities:   Cognitive Behavioral Therapy Solution-Focused Therapy Assertiveness Training   Glenis Smoker, LCSW

## 2022-09-19 NOTE — Progress Notes (Signed)
Travis Sandoval  09/19/2022 10:33 AM Travis Sandoval  MRN:  [id number] Subjective: Follow-up for this 42 year old man with schizophrenia.  Today he had a phone interview with a facility in [facility] and that could be a possible discharge plan.  I was present for part of the interview and agree with other team members that it seemed to go quite well.  Patient handled himself well.  He did tell them about his delusion of being a federal agent but was not fixated on it and instead asked very appropriate questions and was quite pleasant with the interviewer. Principal Problem: Undifferentiated schizophrenia (HCC) Diagnosis: Principal Problem:   Undifferentiated schizophrenia (HCC)  Total Time spent with patient: 30 minutes  Past Psychiatric History: Past history of schizophrenia  Past Medical History:  Past Medical History:  Diagnosis Date   ADHD    Bipolar 1 disorder (HCC)    Hepatitis C    Schizoaffective disorder (HCC)    History reviewed. No pertinent surgical history. Family History:  Family History  Family history unknown: Yes   Family Psychiatric  History: See previous Social History:  Social History   Substance and Sexual Activity  Alcohol Use Not Currently     Social History   Substance and Sexual Activity  Drug Use Not Currently    Social History   Socioeconomic History   Marital status: Unknown    Spouse name: Not on file   Number of children: Not on file   Years of education: Not on file   Highest education level: Not on file  Occupational History   Not on file  Tobacco Use   Smoking status: Every Day    Packs/day: 0.25    Years: 15.00    Additional pack years: 0.00    Total pack years: 3.75    Types: Cigarettes   Smokeless tobacco: Not on file  Vaping Use   Vaping Use: Not on file  Substance and Sexual Activity   Alcohol use: Not Currently   Drug use: Not Currently   Sexual activity: Not Currently  Other Topics Concern   Not on file   Social History Narrative   Not on file   Social Determinants of Health   Financial Resource Strain: Not on file  Food Insecurity: Patient Declined (05/18/2022)   Hunger Vital Sign    Worried About Running Out of Food in the Last Year: Patient declined    Ran Out of Food in the Last Year: Patient declined  Transportation Needs: Patient Declined (05/18/2022)   PRAPARE - Administrator, Civil Service (Medical): Patient declined    Lack of Transportation (Non-Medical): Patient declined  Physical Activity: Not on file  Stress: Not on file  Social Connections: Not on file   Additional Social History:                         Sleep: Fair  Appetite:  Fair  Current Medications: Current Facility-Administered Medications  Medication Dose Route Frequency Provider Last Rate Last Admin   acetaminophen (TYLENOL) tablet 650 mg  650 mg Oral Q6H PRN Gabriel Cirri F, NP   650 mg at 08/28/22 2109   alum & mag hydroxide-simeth (MAALOX/MYLANTA) 200-200-20 MG/5ML suspension 30 mL  30 mL Oral Q4H PRN Gabriel Cirri F, NP   30 mL at 09/18/22 2041   ARIPiprazole ER (ABILIFY MAINTENA) injection 400 mg  400 mg Intramuscular Q28 days Sharlotte Baka, [name], MD   400 mg  at 08/22/22 1615   cloZAPine (CLOZARIL) tablet 300 mg  300 mg Oral QHS Jammie Troup T, MD   300 mg at 09/18/22 2041   guaiFENesin (ROBITUSSIN) 100 MG/5ML liquid 5 mL  5 mL Oral Q4H PRN Erskin Zinda, [name], MD   5 mL at 08/22/22 2110   hydrOXYzine (ATARAX) tablet 50 mg  50 mg Oral Q6H PRN Jocelynn Gioffre, [name], MD   50 mg at 08/10/22 2143   magnesium citrate solution 1 Bottle  1 Bottle Oral Daily PRN Jaynie Bream, RPH   1 Bottle at 07/19/22 1643   magnesium hydroxide (MILK OF MAGNESIA) suspension 30 mL  30 mL Oral Daily PRN Jaynie Bream, RPH   30 mL at 09/15/22 2151   mineral oil liquid   Oral Daily PRN Sarina Ill, DO   Given at 07/17/22 1201   nicotine polacrilex (NICORETTE) gum 2 mg  2 mg Oral PRN Egan Berkheimer,  [name], MD   2 mg at 04/23/22 2200   polyethylene glycol (MIRALAX / GLYCOLAX) packet 17 g  17 g Oral Daily Kayse Puccini, [name], MD   17 g at 09/12/22 1648   senna-docusate (Senokot-S) tablet 2 tablet  2 tablet Oral Daily PRN Andreal Vultaggio, [name], MD   2 tablet at 07/14/22 0809   ziprasidone (GEODON) injection 20 mg  20 mg Intramuscular Q12H PRN Reggie Pile, MD        Lab Results:  Results for orders placed or performed during the hospital encounter of 04/16/22 (from the past 48 hour(s))  CBC with Differential/Platelet     Status: None   Collection Time: 09/18/22  9:10 AM  Result Value Ref Range   WBC 6.7 4.0 - 10.5 K/uL   RBC 5.15 4.22 - 5.81 MIL/uL   Hemoglobin 14.5 13.0 - 17.0 g/dL   HCT 16.1 09.6 - 04.5 %   MCV 85.8 80.0 - 100.0 fL   MCH 28.2 26.0 - 34.0 pg   MCHC 32.8 30.0 - 36.0 g/dL   RDW 40.9 81.1 - 91.4 %   Platelets 168 150 - 400 K/uL   nRBC 0.0 0.0 - 0.2 %   Neutrophils Relative % 46 %   Neutro Abs 3.1 1.7 - 7.7 K/uL   Lymphocytes Relative 44 %   Lymphs Abs 3.0 0.7 - 4.0 K/uL   Monocytes Relative 6 %   Monocytes Absolute 0.4 0.1 - 1.0 K/uL   Eosinophils Relative 3 %   Eosinophils Absolute 0.2 0.0 - 0.5 K/uL   Basophils Relative 1 %   Basophils Absolute 0.1 0.0 - 0.1 K/uL   Immature Granulocytes 0 %   Abs Immature Granulocytes 0.02 0.00 - 0.07 K/uL    Comment: Performed at [facility], [address] [place], [place] [address]    Blood Alcohol level:  Lab Results  Component Value Date   [facility] <10 04/15/2022    Metabolic Disorder Labs: Lab Results  Component Value Date   HGBA1C 5.5 04/24/2022   MPG 111 04/24/2022   No results found for: "PROLACTIN" Lab Results  Component Value Date   CHOL 220 (H) 04/24/2022   TRIG 700 (H) 04/24/2022   HDL 35 (L) 04/24/2022   CHOLHDL 6.3 04/24/2022   VLDL UNABLE TO CALCULATE IF TRIGLYCERIDE OVER 400 mg/dL 62/13/0865   LDLCALC UNABLE TO CALCULATE IF TRIGLYCERIDE OVER 400 mg/dL 78/46/9629    Physical Findings: AIMS:  Facial and Oral Movements Muscles of Facial Expression: None, normal Lips and Perioral Area: None, normal Jaw:  None, normal Tongue: None, normal,Extremity Movements Upper (arms, wrists, hands, fingers): None, normal Lower (legs, knees, ankles, toes): None, normal, Trunk Movements Neck, shoulders, hips: None, normal, Overall Severity Severity of abnormal movements (highest score from questions above): None, normal Incapacitation due to abnormal movements: None, normal Patient's awareness of abnormal movements (rate only patient's report): No Awareness, Dental Status Current problems with teeth and/or dentures?: No Does patient usually wear dentures?: No  CIWA:    COWS:     Musculoskeletal: Strength & Muscle Tone: within normal limits Gait & Station: normal Patient leans: N/A  Psychiatric Specialty Exam:  Presentation  General Appearance:  Disheveled  Eye Contact: Minimal  Speech: Pressured  Speech Volume: Normal  Handedness: Right   Mood and Affect  Mood: Euphoric; Irritable  Affect: Inappropriate   Thought Process  Thought Processes: Disorganized  Descriptions of Associations:Loose  Orientation:Partial  Thought Content:Illogical; Scattered  History of Schizophrenia/Schizoaffective disorder:No data recorded Duration of Psychotic Symptoms:No data recorded Hallucinations:No data recorded Ideas of Reference:Delusions; Paranoia  Suicidal Thoughts:No data recorded Homicidal Thoughts:No data recorded  Sensorium  Memory: Immediate Poor; Recent Poor; Remote Poor  Judgment: Poor  Insight: Poor   Executive Functions  Concentration: Fair  Attention Span: Fair  Recall: Poor  Fund of Knowledge: Poor  Language: Poor   Psychomotor Activity  Psychomotor Activity:No data recorded  Assets  Assets: Desire for Improvement; Social Support   Sleep  Sleep:No data recorded   Physical Exam: Physical Exam Vitals reviewed.   Constitutional:      Appearance: Normal appearance.  HENT:     Head: Normocephalic and atraumatic.     Mouth/Throat:     Pharynx: Oropharynx is clear.  Eyes:     Pupils: Pupils are equal, round, and reactive to light.  Cardiovascular:     Rate and Rhythm: Normal rate and regular rhythm.  Pulmonary:     Effort: Pulmonary effort is normal.     Breath sounds: Normal breath sounds.  Abdominal:     General: Abdomen is flat.     Palpations: Abdomen is soft.  Musculoskeletal:        General: Normal range of motion.  Skin:    General: Skin is warm and dry.  Neurological:     General: No focal deficit present.     Mental Status: He is alert. Mental status is at baseline.  Psychiatric:        Attention and Perception: Attention normal.        Mood and Affect: Mood normal. Affect is blunt.        Speech: Speech normal.        Behavior: Behavior is withdrawn.        Thought Content: Thought content is delusional.    Review of Systems  Constitutional: Negative.   HENT: Negative.    Eyes: Negative.   Respiratory: Negative.    Cardiovascular: Negative.   Gastrointestinal: Negative.   Musculoskeletal: Negative.   Skin: Negative.   Neurological: Negative.   Psychiatric/Behavioral: Negative.     Blood pressure 116/83, pulse 88, temperature (!) 97.4 F (36.3 C), temperature source Oral, resp. rate 18, height 5\' 9"  (1.753 m), weight 96.4 kg, SpO2 97 %. Body mass index is 31.38 kg/m.   Treatment Plan Summary: Plan it sounds like if this were to work out it would still be a bit of a process getting everything approved.  Patient is otherwise quite stable no need to change any treatment plan.  We will do everything we can to  support appropriate discharge planning.  Travis Rasmussen, MD 09/19/2022, 10:33 AM

## 2022-09-19 NOTE — BH IP Treatment Plan (Signed)
Interdisciplinary Treatment and Diagnostic Plan Update  09/19/2022 Time of Session: 08:30 Qi Gignac MRN: [id number]  Principal Diagnosis: Undifferentiated schizophrenia [facility])  Secondary Diagnoses: Principal Problem:   Undifferentiated schizophrenia (HCC)   Current Medications:  Current Facility-Administered Medications  Medication Dose Route Frequency Provider Last Rate Last Admin   acetaminophen (TYLENOL) tablet 650 mg  650 mg Oral Q6H PRN Gabriel Cirri F, NP   650 mg at 08/28/22 2109   alum & mag hydroxide-simeth (MAALOX/MYLANTA) 200-200-20 MG/5ML suspension 30 mL  30 mL Oral Q4H PRN Gabriel Cirri F, NP   30 mL at 09/18/22 2041   ARIPiprazole ER (ABILIFY MAINTENA) injection 400 mg  400 mg Intramuscular Q28 days [name], [name], MD   400 mg at 08/22/22 1615   cloZAPine (CLOZARIL) tablet 300 mg  300 mg Oral QHS [name], [name], MD   300 mg at 09/18/22 2041   guaiFENesin (ROBITUSSIN) 100 MG/5ML liquid 5 mL  5 mL Oral Q4H PRN [name], [name], MD   5 mL at 08/22/22 2110   hydrOXYzine (ATARAX) tablet 50 mg  50 mg Oral Q6H PRN [name], [name], MD   50 mg at 08/10/22 2143   magnesium citrate solution 1 Bottle  1 Bottle Oral Daily PRN Jaynie Bream, RPH   1 Bottle at 07/19/22 1643   magnesium hydroxide (MILK OF MAGNESIA) suspension 30 mL  30 mL Oral Daily PRN Jaynie Bream, RPH   30 mL at 09/15/22 2151   mineral oil liquid   Oral Daily PRN Sarina Ill, DO   Given at 07/17/22 1201   nicotine polacrilex (NICORETTE) gum 2 mg  2 mg Oral PRN [name], [name], MD   2 mg at 04/23/22 2200   polyethylene glycol (MIRALAX / GLYCOLAX) packet 17 g  17 g Oral Daily [name], [name], MD   17 g at 09/12/22 1648   senna-docusate (Senokot-S) tablet 2 tablet  2 tablet Oral Daily PRN [name], [name], MD   2 tablet at 07/14/22 0809   ziprasidone (GEODON) injection 20 mg  20 mg Intramuscular Q12H PRN Reggie Pile, MD       PTA Medications: Medications Prior to Admission  Medication  Sig Dispense Refill Last Dose   ABILIFY MAINTENA 400 MG SRER injection Inject 400 mg into the muscle every 28 (twenty-eight) days.      ARIPiprazole (ABILIFY) 10 MG tablet Take 10 mg by mouth daily. (Patient not taking: Reported on 04/16/2022)      atomoxetine (STRATTERA) 40 MG capsule Take 40 mg by mouth daily. (Patient not taking: Reported on 04/16/2022)      atorvastatin (LIPITOR) 40 MG tablet Take 40 mg by mouth daily.      budesonide-formoterol (SYMBICORT) 160-4.5 MCG/ACT inhaler Inhale 2 puffs into the lungs.      cetirizine (ZYRTEC) 10 MG tablet Take 10 mg by mouth daily.      clozapine (CLOZARIL) 200 MG tablet Take 200 mg by mouth 2 (two) times daily. Take along with one 25 mg tablet for total 225 mg twice daily      cloZAPine (CLOZARIL) 25 MG tablet Take 25 mg by mouth 2 (two) times daily. Take along with one 200 mg tablet for total 225 mg twice daily      Dexlansoprazole (DEXILANT) 30 MG capsule Take 30 mg by mouth daily. (Patient not taking: Reported on 04/16/2022)      hydrOXYzine (VISTARIL) 50 MG capsule Take 50 mg by mouth 3 (three) times daily as needed  for itching. (Patient not taking: Reported on 04/16/2022)      Melatonin 5 MG TABS Take 5 mg by mouth at bedtime as needed (sleep).      metoprolol succinate (TOPROL-XL) 25 MG 24 hr tablet Take 12.5 mg by mouth daily.      Phosphatidyl Choline 65 MG TABS Take 1 tablet by mouth daily.  (Patient not taking: Reported on 04/16/2022)      valproic acid (DEPAKENE) 250 MG/5ML solution Take 750-1,000 mLs by mouth 2 (two) times daily. 1000 mg (20 mL) every morning and 750 mg (15 mL) daily at bedtime       Patient Stressors: Other: Psychosis    Patient Strengths: Ability for insight  Active sense of humor  Average or above average intelligence  Capable of independent living  Supportive family/friends   Treatment Modalities: Medication Management, Group therapy, Case management,  1 to 1 session with clinician, Psychoeducation,  Recreational therapy.   Physician Treatment Plan for Primary Diagnosis: Undifferentiated schizophrenia (HCC) Long Term Goal(s):     Short Term Goals: None, pt is a poor hx.  Medication Management: Evaluate patient's response, side effects, and tolerance of medication regimen.  Therapeutic Interventions: 1 to 1 sessions, Unit Group sessions and Medication administration.  Evaluation of Outcomes: Adequate for Discharge  Physician Treatment Plan for Secondary Diagnosis: Principal Problem:   Undifferentiated schizophrenia (HCC)  Long Term Goal(s):     Short Term Goals: None, pt is a poor hx.     Medication Management: Evaluate patient's response, side effects, and tolerance of medication regimen.  Therapeutic Interventions: 1 to 1 sessions, Unit Group sessions and Medication administration.  Evaluation of Outcomes: Adequate for Discharge   RN Treatment Plan for Primary Diagnosis: Undifferentiated schizophrenia (HCC) Long Term Goal(s): Knowledge of disease and therapeutic regimen to maintain health will improve  Short Term Goals: Ability to remain free from injury will improve, Ability to verbalize frustration and anger appropriately will improve, Ability to demonstrate self-control, Ability to participate in decision making will improve, Ability to verbalize feelings will improve, Ability to disclose and discuss suicidal ideas, Ability to identify and develop effective coping behaviors will improve, and Compliance with prescribed medications will improve  Medication Management: RN will administer medications as ordered by provider, will assess and evaluate patient's response and provide education to patient for prescribed medication. RN will report any adverse and/or side effects to prescribing provider.  Therapeutic Interventions: 1 on 1 counseling sessions, Psychoeducation, Medication administration, Evaluate responses to treatment, Monitor vital signs and CBGs as ordered,  Perform/monitor CIWA, COWS, AIMS and Fall Risk screenings as ordered, Perform wound care treatments as ordered.  Evaluation of Outcomes: Adequate for Discharge   LCSW Treatment Plan for Primary Diagnosis: Undifferentiated schizophrenia (HCC) Long Term Goal(s): Safe transition to appropriate next level of care at discharge, Engage patient in therapeutic group addressing interpersonal concerns.  Short Term Goals: Engage patient in aftercare planning with referrals and resources, Increase social support, Increase ability to appropriately verbalize feelings, Increase emotional regulation, Facilitate acceptance of mental health diagnosis and concerns, Facilitate patient progression through stages of change regarding substance use diagnoses and concerns, Identify triggers associated with mental health/substance abuse issues, and Increase skills for wellness and recovery  Therapeutic Interventions: Assess for all discharge needs, 1 to 1 time with Social worker, Explore available resources and support systems, Assess for adequacy in community support network, Educate family and significant other(s) on suicide prevention, Complete Psychosocial Assessment, Interpersonal group therapy.  Evaluation of Outcomes: Adequate for Discharge  Progress in Treatment: Attending groups: No. Participating in groups: No. Taking medication as prescribed: Yes. Toleration medication: Yes. Family/Significant other contact made: Yes, individual(s) contacted:  father, Houghton Abelar. Patient understands diagnosis: No. Discussing patient identified problems/goals with staff: Yes. Medical problems stabilized or resolved: Yes. Denies suicidal/homicidal ideation: Yes. Issues/concerns per patient self-inventory: No. Other: None.   New problem(s) identified: No, Describe:  none Update 06/30/2021:  No changes at this time. Update 07/06/2022:  No changes at this time. Update 07/11/22: No changes at this time. Update 07/16/22: none.  Update 07/21/22: none. Update 07/26/22: No changes at this time.  Update 07/31/2022:  No changes at this time.  Update 08/05/2022: No change at this time. Update 08/10/22: No changes at this time. Update 08/15/22: No changes at this time. Update 08/20/22: none. Update 08/25/22: none. Update 08/30/22: No changes at this time. Update 09/04/22: No changes at this time. Update 09/09/22: No changes at this time. Update 09/19/22: No changes at this time.   New Short Term/Long Term Goal(s): elimination of symptoms of psychosis, medication management for mood stabilization; elimination of SI thoughts; development of comprehensive mental wellness plan. Update 06/30/2021:  No changes at this time. Update 07/06/2022:  No changes at this time. Update 07/11/22: No changes at this time. Update 07/16/22: Patient to work towards elimination of symptoms of psychosis, medication management for mood stabilization; development of comprehensive mental wellness plan. Update 07/21/22: Patient to work towards elimination of symptoms of psychosis, medication management for mood stabilization; development of comprehensive mental wellness plan. Update 07/26/22: No changes at this time.  Update 07/31/2022:  No changes at this time.  Update 08/05/2022: No change at this time. Update 08/10/22: No changes at this time. Update 08/15/22: No changes at this time. Update 08/20/22:  Patient to work towards elimination of symptoms of psychosis, medication management for mood stabilization; development of comprehensive mental wellness plan. Update 08/25/22: no additional goals identified at this time. Update 08/30/22: No changes at this time. Update 09/04/22: No changes at this time. Update 09/09/22: No changes at this time. Update 09/19/22: No changes at this time.   Patient Goals:  No additional treatment goals identified by patient. Update 06/30/2021:  No changes at this time. Update 07/06/2022:  No changes at this time. Update 07/11/22: No changes at this time. Update 07/16/22: No  additional goals identified at this time. Patient to continue to work towards original goals identified in initial treatment team meeting. CSW will remain available to patient should they voice additional treatment goals. Update 07/21/22: No additional goals identified at this time. Patient to continue to work towards original goals identified in initial treatment team meeting. CSW will remain available to patient should they voice additional treatment goals. Update 07/26/22: No changes at this time.  Update 07/31/2022:  No changes at this time. Update 08/05/2022: No change at this time. Update 08/10/22: No changes at this time. Update 08/15/22: No changes at this time. Update 08/20/22: No additional goals identified at this time. Patient to continue to work towards original goals identified in initial treatment team meeting. CSW will remain available to patient should they voice additional treatment goals. Update 08/25/22: no additional goals identified at this time. Update 08/30/22: No changes at this time. Update 09/04/22: No changes at this time. Update 09/09/22: No changes at this time. Update 09/19/22: No changes at this time.   Discharge Plan or Barriers: Patient remains on the unit as placement continues to be sought. Funding remains the concern at this time and  biggest barrier to placement at this time. Update 06/30/2021:  No changes at this time. Update 07/06/2022:  No changes at this time. Update 07/11/22: No changes at this time. Update 07/16/22: Patient lacks funding for placement, [employer] rep continues to communicate with social security to restart payment. Patient has guardianship hearing on this day. Situation ongoing, CSW will continue to monitor and update note as more information becomes available. Update 07/21/22: Patient lacks adequate funding, CSW to continue pursing payor source to secure placement. Patient is unable to living independently per psychiatrist. Update 07/26/22: No changes at this time. Update  07/31/2022:  No changes at this time.  Update 08/05/2022: No change at this time. Update 08/10/22: No changes at this time. Update 08/15/22: No changes at this time. Update 08/20/22: Patient lack funding to procure group home placement. Per note on 4/29, "CSW reached [facility] DSS regarding update on payee application. [name], DSS Guardian Supervisor, at [phone] ext. [phone] reports the patient has been awarded [employer], although the social security office has not yet awarded an amount. CSW clarified if DSS has been appointed as the patient's payee at this time. [name] reports they have not been appointed as the payee yet. CSW encouraged DSS to apply or have someone appointed payee as soon as possible in order to avoid any delays once social security has determined an amount." Update 08/25/22: Patient is waiting on insurance. Update 08/30/22: Pt has an interview for potential placement option today at 10:30. CSW to assist with virtual interview. Update 09/04/22: Placement search continues. Pt participated in an interview yesterday and team is awaiting determination. Previous interview which had been arranged for 08/30/22 fell through as facility staff member did not enter the call. Update 09/09/22: Pt took place in an interview last week and information was sent to [facility]. Awaiting to hear about decision from interview and will contact to follow up regarding information sent over to [facility]. Update 09/19/22: Pt has upcoming interview for potential placement through [facility]. [employer] liaison to assist with interview process.    Reason for Continuation of Hospitalization: Pt is boarding.   Estimated Length of Stay: TBD Update 07/06/2022:  TBD Update 07/11/22: No changes at this time. Update 07/16/22: TBD. Update 07/21/22: TBD. Update 07/26/22: No changes at this time.  Update 07/31/2022:  No changes at this time.  Update 08/05/2022: No change at this time. Update 08/10/22: No changes at this time.  Update 08/15/22: No changes at this time. Update 08/20/22: TBD. Update 08/25/22: TBD. Update 08/30/22: No changes at this time. Update 09/04/22: No changes at this time. Update 09/09/22: No changes at this time. Update 09/19/22: No changes at this time.  Last 3 [place] Suicide Severity Risk Score: Flowsheet Row Admission (Current) from 04/16/2022 in [facility]IENT [facility] MEDICINE ED from 04/15/2022 in [facility] Emergency Department at [facility]  C-SSRS RISK CATEGORY No Risk No Risk       Last PHQ 2/9 Scores:     No data to display          Scribe for Treatment Team: Glenis Smoker, LCSW 09/19/2022 9:12 AM

## 2022-09-19 NOTE — [facility] Counselor (Signed)
CSW sat with pt while interview for placement arranged by [facility], Alvy Beal. CSW trainee, Misty Stanley and psych provider, Dr. Toni Amend  was present for some of the interview as well. Interview went well. During interaction, pt responded and his behavior was appropriate for him. There were times when his delusions were present, however, these did not appear problematic to the interviewers. Interview ended without incident. No other concerns expressed. Contact ended without incident.  CSW met with pt briefly per request. He asked for his guardian's number. Pt was informed that CSW would get this from his chart and bring it back. He agreed. No other concerns expressed. Contact ended without incident.   Pt received requested information.   Vilma Meckel. Algis Greenhouse, MSW, LCSW, LCAS 09/19/2022 11:14 AM

## 2022-09-19 NOTE — Group Note (Signed)
Date:  09/19/2022 Time:  6:22 PM  Group Topic/Focus:  Outdoor Recreation/Activity    Participation Level:  Did Not Attend   Lynelle Smoke [facility] 09/19/2022, 6:22 PM

## 2022-09-19 NOTE — Group Note (Signed)
Recreation Therapy Group Note   Group Topic:Coping Skills  Group Date: 09/19/2022 Start Time: 1015 End Time: 1120 Facilitators: Rosina Lowenstein, LRT, CTRS Location:  Craft Room  Group Description: Mind Map.  Patient was provided a blank template of a diagram with 32 blank boxes in a tiered system, branching from the center (similar to a bubble chart). LRT directed patients to label the middle of the diagram "Coping Skills". LRT and patients then came up with 8 different coping skills as examples. Pt were directed to record their coping skills in the 2nd tier boxes closest to the center.  Patients would then share their coping skills with the group as LRT wrote them out. LRT gave a handout of 100 different coping skills at the end of group.   Goal Area(s) Addressed: Patients will be able to define "coping skills". Patient will identify new coping skills.  Patient will identify new possible leisure interests.   Affect/Mood: N/A   Participation Level: Did not attend    Clinical Observations/Individualized Feedback: Wesely did not attend group.  Plan: Continue to engage patient in RT group sessions 2-3x/week.   Rosina Lowenstein, LRT, CTRS 09/19/2022 11:48 AM

## 2022-09-19 NOTE — Plan of Care (Signed)
Patient came out of his room and made phone call after the interview. Patient states " I think I did good on the interview. I don't know they are going to accept me. I am ready to leave." Patient denies SI,HI and AVH.Appetite and energy level good. Support and encouragement given.

## 2022-09-19 NOTE — Plan of Care (Signed)
  Problem: Education: Goal: Knowledge of General Education information will improve Description Including pain rating scale, medication(s)/side effects and non-pharmacologic comfort measures Outcome: Progressing   Problem: Nutrition: Goal: Adequate nutrition will be maintained Outcome: Progressing   Problem: Coping: Goal: Level of anxiety will decrease Outcome: Progressing   Problem: Elimination: Goal: Will not experience complications related to urinary retention Outcome: Progressing   Problem: Safety: Goal: Ability to remain free from injury will improve Outcome: Progressing   

## 2022-09-20 NOTE — Group Note (Signed)
Recreation Therapy Group Note   Group Topic:Leisure Education  Group Date: 09/20/2022 Start Time: 1000 End Time: 1100 Facilitators: Rosina Lowenstein, LRT, CTRS Location:  Craft Room  Group Description: Leisure. Patients were given the option to choose from singing karaoke, making origami, using oil pastels, coloring mandalas, or playing UNO. LRT and pts discussed the meaning of leisure, the importance of participating in leisure during their free time/when they're outside of the hospital, as well as how our leisure interests can also serve as coping skills. Pt identified two leisure interests and shared with the group.   Goal Area(s) Addressed:  Patient will identify a current leisure interest.  Patient will learn the definition of "leisure". Patient will practice making a positive decision. Patient will have the opportunity to try a new leisure activity. Patient will communicate with peers and LRT.   Affect/Mood: N/A   Participation Level: Did not attend    Clinical Observations/Individualized Feedback: Crosby did not attend group due to resting in his room.  Plan: Continue to engage patient in RT group sessions 2-3x/week.   Rosina Lowenstein, LRT, CTRS 09/20/2022 11:46 AM

## 2022-09-20 NOTE — Plan of Care (Signed)
Patient isolates to his room with no complaints. Patient outside at the courtyard for sometime. Denies SI,HI and AVH. No issues verbalized. Appetite and energy level good. Support and encouragement given.

## 2022-09-20 NOTE — Group Note (Signed)
Date:  09/20/2022 Time:  6:07 PM  Group Topic/Focus:   Recreational Activity Group    Participation Level:  Active  Participation Quality:  Appropriate  Affect:  Appropriate  Cognitive:  Appropriate  Insight: Good  Engagement in Group:  Engaged  Modes of Intervention:  Activity  Additional Comments:    Davionne Mastrangelo A Jeffory Snelgrove 09/20/2022, 6:07 PM

## 2022-09-20 NOTE — Progress Notes (Signed)
[facility] MD Progress Note  09/20/2022 4:26 PM Travis Sandoval  MRN:  [id number] Subjective: No complaints no changes in behavior presentation Principal Problem: Undifferentiated schizophrenia (HCC) Diagnosis: Principal Problem:   Undifferentiated schizophrenia (HCC)  Total Time spent with patient: 15 minutes  Past Psychiatric History: Past history of schizophrenia  Past Medical History:  Past Medical History:  Diagnosis Date   ADHD    Bipolar 1 disorder (HCC)    Hepatitis C    Schizoaffective disorder (HCC)    History reviewed. No pertinent surgical history. Family History:  Family History  Family history unknown: Yes   Family Psychiatric  History: See previous Social History:  Social History   Substance and Sexual Activity  Alcohol Use Not Currently     Social History   Substance and Sexual Activity  Drug Use Not Currently    Social History   Socioeconomic History   Marital status: Unknown    Spouse name: Not on file   Number of children: Not on file   Years of education: Not on file   Highest education level: Not on file  Occupational History   Not on file  Tobacco Use   Smoking status: Every Day    Packs/day: 0.25    Years: 15.00    Additional pack years: 0.00    Total pack years: 3.75    Types: Cigarettes   Smokeless tobacco: Not on file  Vaping Use   Vaping Use: Not on file  Substance and Sexual Activity   Alcohol use: Not Currently   Drug use: Not Currently   Sexual activity: Not Currently  Other Topics Concern   Not on file  Social History Narrative   Not on file   Social Determinants of Health   Financial Resource Strain: Not on file  Food Insecurity: Patient Declined (05/18/2022)   Hunger Vital Sign    Worried About Running Out of Food in the Last Year: Patient declined    Ran Out of Food in the Last Year: Patient declined  Transportation Needs: Patient Declined (05/18/2022)   PRAPARE - Administrator, Civil Service (Medical):  Patient declined    Lack of Transportation (Non-Medical): Patient declined  Physical Activity: Not on file  Stress: Not on file  Social Connections: Not on file   Additional Social History:                         Sleep: Fair  Appetite:  Fair  Current Medications: Current Facility-Administered Medications  Medication Dose Route Frequency Provider Last Rate Last Admin   acetaminophen (TYLENOL) tablet 650 mg  650 mg Oral Q6H PRN Gabriel Cirri F, NP   650 mg at 08/28/22 2109   alum & mag hydroxide-simeth (MAALOX/MYLANTA) 200-200-20 MG/5ML suspension 30 mL  30 mL Oral Q4H PRN Gabriel Cirri F, NP   30 mL at 09/19/22 2059   ARIPiprazole ER (ABILIFY MAINTENA) injection 400 mg  400 mg Intramuscular Q28 days Laquesha Holcomb T, MD   400 mg at 09/19/22 1453   cloZAPine (CLOZARIL) tablet 300 mg  300 mg Oral QHS Jaice Lague T, MD   300 mg at 09/19/22 2059   guaiFENesin (ROBITUSSIN) 100 MG/5ML liquid 5 mL  5 mL Oral Q4H PRN De Jaworski T, MD   5 mL at 08/22/22 2110   hydrOXYzine (ATARAX) tablet 50 mg  50 mg Oral Q6H PRN Mikayela Deats, [name], MD   50 mg at 08/10/22 2143   magnesium citrate  solution 1 Bottle  1 Bottle Oral Daily PRN Jaynie Bream, RPH   1 Bottle at 07/19/22 1643   magnesium hydroxide (MILK OF MAGNESIA) suspension 30 mL  30 mL Oral Daily PRN Jaynie Bream, RPH   30 mL at 09/15/22 2151   mineral oil liquid   Oral Daily PRN Sarina Ill, DO   Given at 07/17/22 1201   nicotine polacrilex (NICORETTE) gum 2 mg  2 mg Oral PRN Edithe Dobbin, [name], MD   2 mg at 04/23/22 2200   polyethylene glycol (MIRALAX / GLYCOLAX) packet 17 g  17 g Oral Daily Yitzel Shasteen, [name], MD   17 g at 09/12/22 1648   senna-docusate (Senokot-S) tablet 2 tablet  2 tablet Oral Daily PRN Ranesha Val, [name], MD   2 tablet at 07/14/22 0809   ziprasidone (GEODON) injection 20 mg  20 mg Intramuscular Q12H PRN Reggie Pile, MD        Lab Results: No results found for this or any previous visit (from  the past 48 hour(s)).  Blood Alcohol level:  Lab Results  Component Value Date   ETH <10 04/15/2022    Metabolic Disorder Labs: Lab Results  Component Value Date   HGBA1C 5.5 04/24/2022   MPG 111 04/24/2022   No results found for: "PROLACTIN" Lab Results  Component Value Date   CHOL 220 (H) 04/24/2022   TRIG 700 (H) 04/24/2022   HDL 35 (L) 04/24/2022   CHOLHDL 6.3 04/24/2022   VLDL UNABLE TO CALCULATE IF TRIGLYCERIDE OVER 400 mg/dL 60/45/4098   LDLCALC UNABLE TO CALCULATE IF TRIGLYCERIDE OVER 400 mg/dL 11/91/4782    Physical Findings: AIMS: Facial and Oral Movements Muscles of Facial Expression: None, normal Lips and Perioral Area: None, normal Jaw: None, normal Tongue: None, normal,Extremity Movements Upper (arms, wrists, hands, fingers): None, normal Lower (legs, knees, ankles, toes): None, normal, Trunk Movements Neck, shoulders, hips: None, normal, Overall Severity Severity of abnormal movements (highest score from questions above): None, normal Incapacitation due to abnormal movements: None, normal Patient's awareness of abnormal movements (rate only patient's report): No Awareness, Dental Status Current problems with teeth and/or dentures?: No Does patient usually wear dentures?: No  CIWA:    COWS:     Musculoskeletal: Strength & Muscle Tone: within normal limits Gait & Station: normal Patient leans: N/A  Psychiatric Specialty Exam:  Presentation  General Appearance:  Disheveled  Eye Contact: Minimal  Speech: Pressured  Speech Volume: Normal  Handedness: Right   Mood and Affect  Mood: Euphoric; Irritable  Affect: Inappropriate   Thought Process  Thought Processes: Disorganized  Descriptions of Associations:Loose  Orientation:Partial  Thought Content:Illogical; Scattered  History of Schizophrenia/Schizoaffective disorder:No data recorded Duration of Psychotic Symptoms:No data recorded Hallucinations:No data recorded Ideas of  Reference:Delusions; Paranoia  Suicidal Thoughts:No data recorded Homicidal Thoughts:No data recorded  Sensorium  Memory: Immediate Poor; Recent Poor; Remote Poor  Judgment: Poor  Insight: Poor   Executive Functions  Concentration: Fair  Attention Span: Fair  Recall: Poor  Fund of Knowledge: Poor  Language: Poor   Psychomotor Activity  Psychomotor Activity:No data recorded  Assets  Assets: Desire for Improvement; Social Support   Sleep  Sleep:No data recorded   Physical Exam: Physical Exam Vitals reviewed.  Constitutional:      Appearance: Normal appearance.  HENT:     Head: Normocephalic and atraumatic.     Mouth/Throat:     Pharynx: Oropharynx is clear.  Eyes:     Pupils: Pupils are equal, round,  and reactive to light.  Cardiovascular:     Rate and Rhythm: Normal rate and regular rhythm.  Pulmonary:     Effort: Pulmonary effort is normal.     Breath sounds: Normal breath sounds.  Abdominal:     General: Abdomen is flat.     Palpations: Abdomen is soft.  Musculoskeletal:        General: Normal range of motion.  Skin:    General: Skin is warm and dry.  Neurological:     General: No focal deficit present.     Mental Status: He is alert. Mental status is at baseline.  Psychiatric:        Mood and Affect: Mood normal.        Thought Content: Thought content normal.    Review of Systems  Constitutional: Negative.   HENT: Negative.    Eyes: Negative.   Respiratory: Negative.    Cardiovascular: Negative.   Gastrointestinal: Negative.   Musculoskeletal: Negative.   Skin: Negative.   Neurological: Negative.   Psychiatric/Behavioral: Negative.     Blood pressure 117/78, pulse 98, temperature 97.7 F (36.5 C), temperature source Oral, resp. rate 19, height 5\' 9"  (1.753 m), weight 96.4 kg, SpO2 98 %. Body mass index is 31.38 kg/m.   Treatment Plan Summary: Plan no change to presentation.  Working on discharge planning.  Travis Rasmussen, MD 09/20/2022, 4:26 PM

## 2022-09-20 NOTE — Progress Notes (Signed)
D -  Patient expressed hopefulness with accomplishments pertaining to discharge planning.  Jazen remained visible in the milieu and behaviorally appropriate.  Patient is able to advocate for needs.  Mood appeared stable with an improved and brighter affect.  A - Medications administered as ordered.   R.  Patient fell asleep before 2230, voicing no concerns or complaints.  Will continue to provide care and monitor.

## 2022-09-21 DIAGNOSIS — F200 Paranoid schizophrenia: Secondary | ICD-10-CM | POA: Diagnosis not present

## 2022-09-21 NOTE — Progress Notes (Signed)
D - Travis Sandoval remained in his room for the majority of the evening.  He accepted his medications as ordered and was pleasant upon contact.  Mood was stable and affect was flat.  A - Medications administered as ordered and patient was encouraged to join peers in the milieu.  R - No significant changes to note.

## 2022-09-21 NOTE — Progress Notes (Signed)
[facility] MD Progress Note  09/21/2022 5:12 PM Travis Sandoval  MRN:  [id number] Subjective: Travis Sandoval is seen on rounds.  Nurses report no issues.  They say that he had an interview for an assisted living.  He says that it went well.  He denies any side effects from his medications.  He states that he is sleeping and eating well.    Principal Problem: Undifferentiated schizophrenia (HCC) Diagnosis: Principal Problem:   Undifferentiated schizophrenia (HCC)  Total Time spent with patient: 15 minutes  Past Psychiatric History: History of schizophrenia  Past Medical History:  Past Medical History:  Diagnosis Date   ADHD    Bipolar 1 disorder (HCC)    Hepatitis C    Schizoaffective disorder (HCC)    History reviewed. No pertinent surgical history. Family History:  Family History  Family history unknown: Yes   Family Psychiatric  History: Unremarkable Social History:  Social History   Substance and Sexual Activity  Alcohol Use Not Currently     Social History   Substance and Sexual Activity  Drug Use Not Currently    Social History   Socioeconomic History   Marital status: Unknown    Spouse name: Not on file   Number of children: Not on file   Years of education: Not on file   Highest education level: Not on file  Occupational History   Not on file  Tobacco Use   Smoking status: Every Day    Packs/day: 0.25    Years: 15.00    Additional pack years: 0.00    Total pack years: 3.75    Types: Cigarettes   Smokeless tobacco: Not on file  Vaping Use   Vaping Use: Not on file  Substance and Sexual Activity   Alcohol use: Not Currently   Drug use: Not Currently   Sexual activity: Not Currently  Other Topics Concern   Not on file  Social History Narrative   Not on file   Social Determinants of Health   Financial Resource Strain: Not on file  Food Insecurity: Patient Declined (05/18/2022)   Hunger Vital Sign    Worried About Running Out of Food in the Last Year: Patient  declined    Ran Out of Food in the Last Year: Patient declined  Transportation Needs: Patient Declined (05/18/2022)   PRAPARE - Administrator, Civil Service (Medical): Patient declined    Lack of Transportation (Non-Medical): Patient declined  Physical Activity: Not on file  Stress: Not on file  Social Connections: Not on file   Additional Social History:                         Sleep: Good  Appetite:  Good  Current Medications: Current Facility-Administered Medications  Medication Dose Route Frequency Provider Last Rate Last Admin   acetaminophen (TYLENOL) tablet 650 mg  650 mg Oral Q6H PRN Gabriel Cirri F, NP   650 mg at 08/28/22 2109   alum & mag hydroxide-simeth (MAALOX/MYLANTA) 200-200-20 MG/5ML suspension 30 mL  30 mL Oral Q4H PRN Gabriel Cirri F, NP   30 mL at 09/20/22 2112   ARIPiprazole ER (ABILIFY MAINTENA) injection 400 mg  400 mg Intramuscular Q28 days [name], [name], MD   400 mg at 09/19/22 1453   cloZAPine (CLOZARIL) tablet 300 mg  300 mg Oral QHS [name], [name], MD   300 mg at 09/20/22 2112   guaiFENesin (ROBITUSSIN) 100 MG/5ML liquid 5 mL  5 mL  Oral Q4H PRN [name], [name], MD   5 mL at 08/22/22 2110   hydrOXYzine (ATARAX) tablet 50 mg  50 mg Oral Q6H PRN [name], [name], MD   50 mg at 08/10/22 2143   magnesium citrate solution 1 Bottle  1 Bottle Oral Daily PRN Jaynie Bream, RPH   1 Bottle at 07/19/22 1643   magnesium hydroxide (MILK OF MAGNESIA) suspension 30 mL  30 mL Oral Daily PRN Jaynie Bream, RPH   30 mL at 09/15/22 2151   mineral oil liquid   Oral Daily PRN Sarina Ill, DO   Given at 07/17/22 1201   nicotine polacrilex (NICORETTE) gum 2 mg  2 mg Oral PRN [name], [name], MD   2 mg at 04/23/22 2200   polyethylene glycol (MIRALAX / GLYCOLAX) packet 17 g  17 g Oral Daily [name], [name], MD   17 g at 09/12/22 1648   senna-docusate (Senokot-S) tablet 2 tablet  2 tablet Oral Daily PRN [name], [name], MD   2 tablet  at 07/14/22 0809   ziprasidone (GEODON) injection 20 mg  20 mg Intramuscular Q12H PRN Reggie Pile, MD        Lab Results: No results found for this or any previous visit (from the past 48 hour(s)).  Blood Alcohol level:  Lab Results  Component Value Date   ETH <10 04/15/2022    Metabolic Disorder Labs: Lab Results  Component Value Date   HGBA1C 5.5 04/24/2022   MPG 111 04/24/2022   No results found for: "PROLACTIN" Lab Results  Component Value Date   CHOL 220 (H) 04/24/2022   TRIG 700 (H) 04/24/2022   HDL 35 (L) 04/24/2022   CHOLHDL 6.3 04/24/2022   VLDL UNABLE TO CALCULATE IF TRIGLYCERIDE OVER 400 mg/dL 13/11/6576   LDLCALC UNABLE TO CALCULATE IF TRIGLYCERIDE OVER 400 mg/dL 46/96/2952    Physical Findings: AIMS: Facial and Oral Movements Muscles of Facial Expression: None, normal Lips and Perioral Area: None, normal Jaw: None, normal Tongue: None, normal,Extremity Movements Upper (arms, wrists, hands, fingers): None, normal Lower (legs, knees, ankles, toes): None, normal, Trunk Movements Neck, shoulders, hips: None, normal, Overall Severity Severity of abnormal movements (highest score from questions above): None, normal Incapacitation due to abnormal movements: None, normal Patient's awareness of abnormal movements (rate only patient's report): No Awareness, Dental Status Current problems with teeth and/or dentures?: No Does patient usually wear dentures?: No  CIWA:    COWS:     Musculoskeletal: Strength & Muscle Tone: within normal limits Gait & Station: normal Patient leans: N/A  Psychiatric Specialty Exam:  Presentation  General Appearance:  Disheveled  Eye Contact: Minimal  Speech: Pressured  Speech Volume: Normal  Handedness: Right   Mood and Affect  Mood: Euphoric; Irritable  Affect: Inappropriate   Thought Process  Thought Processes: Disorganized  Descriptions of Associations:Loose  Orientation:Partial  Thought  Content:Illogical; Scattered  History of Schizophrenia/Schizoaffective disorder:No data recorded Duration of Psychotic Symptoms:No data recorded Hallucinations:No data recorded Ideas of Reference:Delusions; Paranoia  Suicidal Thoughts:No data recorded Homicidal Thoughts:No data recorded  Sensorium  Memory: Immediate Poor; Recent Poor; Remote Poor  Judgment: Poor  Insight: Poor   Executive Functions  Concentration: Fair  Attention Span: Fair  Recall: Poor  Fund of Knowledge: Poor  Language: Poor   Psychomotor Activity  Psychomotor Activity:No data recorded  Assets  Assets: Desire for Improvement; Social Support   Sleep  Sleep:No data recorded    Blood pressure 104/75, pulse 92, temperature (!)  97.5 F (36.4 C), temperature source Oral, resp. rate 18, height 5\' 9"  (1.753 m), weight 96.4 kg, SpO2 98 %. Body mass index is 31.38 kg/m.   Treatment Plan Summary: Daily contact with patient to assess and evaluate symptoms and progress in treatment, Medication management, and Plan continue current medications.  Sarina Ill, DO 09/21/2022, 5:12 PM

## 2022-09-21 NOTE — Group Note (Signed)
LCSW Group Therapy Note   Group Date: 09/21/2022 Start Time: 1305 End Time: 1345   Type of Therapy and Topic:  Group Therapy: Wellness  Participation Level:  Did Not Attend    Summary of Patient Progress:  The patient did not attend group.    Marshell Levan, LCSWA 09/21/2022  3:23 PM

## 2022-09-21 NOTE — Plan of Care (Signed)
Patient stated that he slept good and no issues. Isolates in his room and minimal interactions with staff. Denies SI,HI and AVH. Appetite and energy level good. Support and encouragement given.

## 2022-09-22 DIAGNOSIS — F200 Paranoid schizophrenia: Secondary | ICD-10-CM | POA: Diagnosis not present

## 2022-09-22 NOTE — Progress Notes (Signed)
Patient ID: Travis Sandoval, male   DOB: 1981/02/10, 42 y.o.   MRN: [id number] Patient presents with depressed mood, affect blunted. Pt denies any acute concerns. Continues to wait on placement. He is eating and drinking well, reports no need for miralax. Pt does con't to have poor hygiene and requires prompting to complete his adl's. Travis Sandoval remains isolative to his room, no interaction with peers noted. Pt is safe, denies any SI HI . Will con't to monitor.

## 2022-09-22 NOTE — [facility] Group Notes (Signed)
[facility] Group Notes:  (Nursing/MHT/Case Management/Adjunct)  Date:  09/22/2022  Time:  9:58 AM  Type of Therapy:  Psychoeducational Skills  Participation Level:  Did Not Attend  Participation Quality:   na  Affect:   na  Cognitive:   na  Insight:  None  Engagement in Group:   na  Modes of Intervention:   na  Summary of Progress/Problems:  Travis Sandoval 09/22/2022, 9:58 AM

## 2022-09-22 NOTE — Progress Notes (Signed)
[facility] MD Progress Note  09/22/2022 12:52 PM Elwell Cammisa  MRN:  [id number] Subjective: Travis Sandoval is seen on rounds.  He has no complaints.  Nurses report no issues.  He has been compliant with medications and denies any side effects.  He has been in good controls.  He is looking forward to discharge someday. Principal Problem: Undifferentiated schizophrenia (HCC) Diagnosis: Principal Problem:   Undifferentiated schizophrenia (HCC)  Total Time spent with patient: 15 minutes  Past Psychiatric History: Schizophrenia  Past Medical History:  Past Medical History:  Diagnosis Date   ADHD    Bipolar 1 disorder (HCC)    Hepatitis C    Schizoaffective disorder (HCC)    History reviewed. No pertinent surgical history. Family History:  Family History  Family history unknown: Yes   Family Psychiatric  History: Unremarkable Social History:  Social History   Substance and Sexual Activity  Alcohol Use Not Currently     Social History   Substance and Sexual Activity  Drug Use Not Currently    Social History   Socioeconomic History   Marital status: Unknown    Spouse name: Not on file   Number of children: Not on file   Years of education: Not on file   Highest education level: Not on file  Occupational History   Not on file  Tobacco Use   Smoking status: Every Day    Packs/day: 0.25    Years: 15.00    Additional pack years: 0.00    Total pack years: 3.75    Types: Cigarettes   Smokeless tobacco: Not on file  Vaping Use   Vaping Use: Not on file  Substance and Sexual Activity   Alcohol use: Not Currently   Drug use: Not Currently   Sexual activity: Not Currently  Other Topics Concern   Not on file  Social History Narrative   Not on file   Social Determinants of Health   Financial Resource Strain: Not on file  Food Insecurity: Patient Declined (05/18/2022)   Hunger Vital Sign    Worried About Running Out of Food in the Last Year: Patient declined    Ran Out of Food in  the Last Year: Patient declined  Transportation Needs: Patient Declined (05/18/2022)   PRAPARE - Administrator, Civil Service (Medical): Patient declined    Lack of Transportation (Non-Medical): Patient declined  Physical Activity: Not on file  Stress: Not on file  Social Connections: Not on file   Additional Social History:                         Sleep: Good  Appetite:  Good  Current Medications: Current Facility-Administered Medications  Medication Dose Route Frequency Provider Last Rate Last Admin   acetaminophen (TYLENOL) tablet 650 mg  650 mg Oral Q6H PRN Gabriel Cirri F, NP   650 mg at 08/28/22 2109   alum & mag hydroxide-simeth (MAALOX/MYLANTA) 200-200-20 MG/5ML suspension 30 mL  30 mL Oral Q4H PRN Gabriel Cirri F, NP   30 mL at 09/21/22 2117   ARIPiprazole ER (ABILIFY MAINTENA) injection 400 mg  400 mg Intramuscular Q28 days [name], [name], MD   400 mg at 09/19/22 1453   cloZAPine (CLOZARIL) tablet 300 mg  300 mg Oral QHS [name], [name], MD   300 mg at 09/21/22 2116   guaiFENesin (ROBITUSSIN) 100 MG/5ML liquid 5 mL  5 mL Oral Q4H PRN [name], [name], MD   5 mL  at 08/22/22 2110   hydrOXYzine (ATARAX) tablet 50 mg  50 mg Oral Q6H PRN [name], [name], MD   50 mg at 08/10/22 2143   magnesium citrate solution 1 Bottle  1 Bottle Oral Daily PRN Jaynie Bream, RPH   1 Bottle at 07/19/22 1643   magnesium hydroxide (MILK OF MAGNESIA) suspension 30 mL  30 mL Oral Daily PRN Jaynie Bream, RPH   30 mL at 09/15/22 2151   mineral oil liquid   Oral Daily PRN Sarina Ill, DO   Given at 07/17/22 1201   nicotine polacrilex (NICORETTE) gum 2 mg  2 mg Oral PRN [name], [name], MD   2 mg at 04/23/22 2200   polyethylene glycol (MIRALAX / GLYCOLAX) packet 17 g  17 g Oral Daily [name], [name], MD   17 g at 09/12/22 1648   senna-docusate (Senokot-S) tablet 2 tablet  2 tablet Oral Daily PRN [name], [name], MD   2 tablet at 07/14/22 0809   ziprasidone  (GEODON) injection 20 mg  20 mg Intramuscular Q12H PRN Reggie Pile, MD        Lab Results: No results found for this or any previous visit (from the past 48 hour(s)).  Blood Alcohol level:  Lab Results  Component Value Date   ETH <10 04/15/2022    Metabolic Disorder Labs: Lab Results  Component Value Date   HGBA1C 5.5 04/24/2022   MPG 111 04/24/2022   No results found for: "PROLACTIN" Lab Results  Component Value Date   CHOL 220 (H) 04/24/2022   TRIG 700 (H) 04/24/2022   HDL 35 (L) 04/24/2022   CHOLHDL 6.3 04/24/2022   VLDL UNABLE TO CALCULATE IF TRIGLYCERIDE OVER 400 mg/dL 16/01/9603   LDLCALC UNABLE TO CALCULATE IF TRIGLYCERIDE OVER 400 mg/dL 54/12/8117    Physical Findings: AIMS: Facial and Oral Movements Muscles of Facial Expression: None, normal Lips and Perioral Area: None, normal Jaw: None, normal Tongue: None, normal,Extremity Movements Upper (arms, wrists, hands, fingers): None, normal Lower (legs, knees, ankles, toes): None, normal, Trunk Movements Neck, shoulders, hips: None, normal, Overall Severity Severity of abnormal movements (highest score from questions above): None, normal Incapacitation due to abnormal movements: None, normal Patient's awareness of abnormal movements (rate only patient's report): No Awareness, Dental Status Current problems with teeth and/or dentures?: No Does patient usually wear dentures?: No  CIWA:    COWS:     Musculoskeletal: Strength & Muscle Tone: within normal limits Gait & Station: normal Patient leans: N/A  Psychiatric Specialty Exam:  Presentation  General Appearance:  Disheveled  Eye Contact: Minimal  Speech: Pressured  Speech Volume: Normal  Handedness: Right   Mood and Affect  Mood: Euphoric; Irritable  Affect: Inappropriate   Thought Process  Thought Processes: Disorganized  Descriptions of Associations:Loose  Orientation:Partial  Thought Content:Illogical; Scattered  History of  Schizophrenia/Schizoaffective disorder:No data recorded Duration of Psychotic Symptoms:No data recorded Hallucinations:No data recorded Ideas of Reference:Delusions; Paranoia  Suicidal Thoughts:No data recorded Homicidal Thoughts:No data recorded  Sensorium  Memory: Immediate Poor; Recent Poor; Remote Poor  Judgment: Poor  Insight: Poor   Executive Functions  Concentration: Fair  Attention Span: Fair  Recall: Poor  Fund of Knowledge: Poor  Language: Poor   Psychomotor Activity  Psychomotor Activity:No data recorded  Assets  Assets: Desire for Improvement; Social Support   Sleep  Sleep:No data recorded    Blood pressure 132/82, pulse 76, temperature 97.8 F (36.6 C), temperature source Oral, resp. rate 18, height 5'  9" (1.753 m), weight 96.4 kg, SpO2 98 %. Body mass index is 31.38 kg/m.   Treatment Plan Summary: Daily contact with patient to assess and evaluate symptoms and progress in treatment, Medication management, and Plan continue current medications.  Sarina Ill, DO 09/22/2022, 12:52 PM

## 2022-09-22 NOTE — Group Note (Signed)
Date:  09/22/2022 Time:  4:17 PM  Group Topic/Focus:  Activity Group    Participation Level:  Active  Participation Quality:  Appropriate  Affect:  Appropriate  Cognitive:  Appropriate  Insight: Appropriate  Engagement in Group:  Engaged  Modes of Intervention:  Activity  Additional Comments:    Mary Sella Rajan Burgard 09/22/2022, 4:17 PM

## 2022-09-23 DIAGNOSIS — F203 Undifferentiated schizophrenia: Secondary | ICD-10-CM | POA: Diagnosis not present

## 2022-09-23 NOTE — Progress Notes (Signed)
Patient alert and oriented x 4, affect is blunted thoughts are organized and coherent he denies SI/HI/AVH. Patient was offered emotional support and encouragement, he was compliant with medication regimen, 15 minutes safety checks maintained will continue to monitor closely.

## 2022-09-23 NOTE — Progress Notes (Signed)
[facility] MD Progress Note  09/23/2022 11:30 AM Travis Sandoval  MRN:  [id number] Subjective: Patient seen and chart reviewed.  No new complaints.  Mood stable.  No behavior problems Principal Problem: Undifferentiated schizophrenia (HCC) Diagnosis: Principal Problem:   Undifferentiated schizophrenia (HCC)  Total Time spent with patient: 20 minutes  Past Psychiatric History: Past history of schizophrenia  Past Medical History:  Past Medical History:  Diagnosis Date   ADHD    Bipolar 1 disorder (HCC)    Hepatitis C    Schizoaffective disorder (HCC)    History reviewed. No pertinent surgical history. Family History:  Family History  Family history unknown: Yes   Family Psychiatric  History: See previous Social History:  Social History   Substance and Sexual Activity  Alcohol Use Not Currently     Social History   Substance and Sexual Activity  Drug Use Not Currently    Social History   Socioeconomic History   Marital status: Unknown    Spouse name: Not on file   Number of children: Not on file   Years of education: Not on file   Highest education level: Not on file  Occupational History   Not on file  Tobacco Use   Smoking status: Every Day    Packs/day: 0.25    Years: 15.00    Additional pack years: 0.00    Total pack years: 3.75    Types: Cigarettes   Smokeless tobacco: Not on file  Vaping Use   Vaping Use: Not on file  Substance and Sexual Activity   Alcohol use: Not Currently   Drug use: Not Currently   Sexual activity: Not Currently  Other Topics Concern   Not on file  Social History Narrative   Not on file   Social Determinants of Health   Financial Resource Strain: Not on file  Food Insecurity: Patient Declined (05/18/2022)   Hunger Vital Sign    Worried About Running Out of Food in the Last Year: Patient declined    Ran Out of Food in the Last Year: Patient declined  Transportation Needs: Patient Declined (05/18/2022)   PRAPARE - Therapist, art (Medical): Patient declined    Lack of Transportation (Non-Medical): Patient declined  Physical Activity: Not on file  Stress: Not on file  Social Connections: Not on file   Additional Social History:                         Sleep: Fair  Appetite:  Fair  Current Medications: Current Facility-Administered Medications  Medication Dose Route Frequency Provider Last Rate Last Admin   acetaminophen (TYLENOL) tablet 650 mg  650 mg Oral Q6H PRN Gabriel Cirri F, NP   650 mg at 08/28/22 2109   alum & mag hydroxide-simeth (MAALOX/MYLANTA) 200-200-20 MG/5ML suspension 30 mL  30 mL Oral Q4H PRN Gabriel Cirri F, NP   30 mL at 09/22/22 2111   ARIPiprazole ER (ABILIFY MAINTENA) injection 400 mg  400 mg Intramuscular Q28 days Abdulaziz Toman T, MD   400 mg at 09/19/22 1453   cloZAPine (CLOZARIL) tablet 300 mg  300 mg Oral QHS Vearl Aitken T, MD   300 mg at 09/22/22 2111   guaiFENesin (ROBITUSSIN) 100 MG/5ML liquid 5 mL  5 mL Oral Q4H PRN Destynee Stringfellow, [name], MD   5 mL at 08/22/22 2110   hydrOXYzine (ATARAX) tablet 50 mg  50 mg Oral Q6H PRN Leevi Cullars, [name], MD  50 mg at 08/10/22 2143   magnesium citrate solution 1 Bottle  1 Bottle Oral Daily PRN Jaynie Bream, RPH   1 Bottle at 07/19/22 1643   magnesium hydroxide (MILK OF MAGNESIA) suspension 30 mL  30 mL Oral Daily PRN Jaynie Bream, RPH   30 mL at 09/15/22 2151   mineral oil liquid   Oral Daily PRN Sarina Ill, DO   Given at 07/17/22 1201   nicotine polacrilex (NICORETTE) gum 2 mg  2 mg Oral PRN Breasia Karges, [name], MD   2 mg at 04/23/22 2200   polyethylene glycol (MIRALAX / GLYCOLAX) packet 17 g  17 g Oral Daily Chief Walkup, [name], MD   17 g at 09/12/22 1648   senna-docusate (Senokot-S) tablet 2 tablet  2 tablet Oral Daily PRN Shaheen Star, [name], MD   2 tablet at 07/14/22 0809   ziprasidone (GEODON) injection 20 mg  20 mg Intramuscular Q12H PRN Reggie Pile, MD        Lab Results: No results found for  this or any previous visit (from the past 48 hour(s)).  Blood Alcohol level:  Lab Results  Component Value Date   ETH <10 04/15/2022    Metabolic Disorder Labs: Lab Results  Component Value Date   HGBA1C 5.5 04/24/2022   MPG 111 04/24/2022   No results found for: "PROLACTIN" Lab Results  Component Value Date   CHOL 220 (H) 04/24/2022   TRIG 700 (H) 04/24/2022   HDL 35 (L) 04/24/2022   CHOLHDL 6.3 04/24/2022   VLDL UNABLE TO CALCULATE IF TRIGLYCERIDE OVER 400 mg/dL 78/29/5621   LDLCALC UNABLE TO CALCULATE IF TRIGLYCERIDE OVER 400 mg/dL 30/86/5784    Physical Findings: AIMS: Facial and Oral Movements Muscles of Facial Expression: None, normal Lips and Perioral Area: None, normal Jaw: None, normal Tongue: None, normal,Extremity Movements Upper (arms, wrists, hands, fingers): None, normal Lower (legs, knees, ankles, toes): None, normal, Trunk Movements Neck, shoulders, hips: None, normal, Overall Severity Severity of abnormal movements (highest score from questions above): None, normal Incapacitation due to abnormal movements: None, normal Patient's awareness of abnormal movements (rate only patient's report): No Awareness, Dental Status Current problems with teeth and/or dentures?: No Does patient usually wear dentures?: No  CIWA:    COWS:     Musculoskeletal: Strength & Muscle Tone: within normal limits Gait & Station: normal Patient leans: Right  Psychiatric Specialty Exam:  Presentation  General Appearance:  Disheveled  Eye Contact: Minimal  Speech: Pressured  Speech Volume: Normal  Handedness: Right   Mood and Affect  Mood: Euphoric; Irritable  Affect: Inappropriate   Thought Process  Thought Processes: Disorganized  Descriptions of Associations:Loose  Orientation:Partial  Thought Content:Illogical; Scattered  History of Schizophrenia/Schizoaffective disorder:No data recorded Duration of Psychotic Symptoms:No data  recorded Hallucinations:No data recorded Ideas of Reference:Delusions; Paranoia  Suicidal Thoughts:No data recorded Homicidal Thoughts:No data recorded  Sensorium  Memory: Immediate Poor; Recent Poor; Remote Poor  Judgment: Poor  Insight: Poor   Executive Functions  Concentration: Fair  Attention Span: Fair  Recall: Poor  Fund of Knowledge: Poor  Language: Poor   Psychomotor Activity  Psychomotor Activity:No data recorded  Assets  Assets: Desire for Improvement; Social Support   Sleep  Sleep:No data recorded   Physical Exam: Physical Exam Vitals and nursing note reviewed.  Constitutional:      Appearance: Normal appearance.  HENT:     Head: Normocephalic and atraumatic.     Mouth/Throat:     Pharynx: Oropharynx is  clear.  Eyes:     Pupils: Pupils are equal, round, and reactive to light.  Cardiovascular:     Rate and Rhythm: Normal rate and regular rhythm.  Pulmonary:     Effort: Pulmonary effort is normal.     Breath sounds: Normal breath sounds.  Abdominal:     General: Abdomen is flat.     Palpations: Abdomen is soft.  Musculoskeletal:        General: Normal range of motion.  Skin:    General: Skin is warm and dry.  Neurological:     General: No focal deficit present.     Mental Status: He is alert. Mental status is at baseline.  Psychiatric:        Attention and Perception: Attention normal.        Mood and Affect: Mood normal. Affect is blunt.        Speech: Speech normal.        Behavior: Behavior is cooperative.        Thought Content: Thought content normal.        Cognition and Memory: Cognition normal.    Review of Systems  Constitutional: Negative.   HENT: Negative.    Eyes: Negative.   Respiratory: Negative.    Cardiovascular: Negative.   Gastrointestinal: Negative.   Musculoskeletal: Negative.   Skin: Negative.   Neurological: Negative.   Psychiatric/Behavioral: Negative.     Blood pressure 104/69, pulse 88,  temperature 98 F (36.7 C), temperature source Oral, resp. rate 19, height 5\' 9"  (1.753 m), weight 96.4 kg, SpO2 97 %. Body mass index is 31.38 kg/m.   Treatment Plan Summary: No change to medication or treatment plan.  We continue to focus on discharge planning  Mordecai Rasmussen, MD 09/23/2022, 11:30 AM

## 2022-09-23 NOTE — Group Note (Signed)
Date:  09/23/2022 Time:  9:51 PM  Group Topic/Focus:  Wrap-Up Group:   The focus of this group is to help patients review their daily goal of treatment and discuss progress on daily workbooks.    Participation Level:  Active  Participation Quality:  Appropriate and Attentive  Affect:  Appropriate  Cognitive:  Alert and Appropriate  Insight: Good  Engagement in Group:  Improving  Modes of Intervention:  Limit-setting  Additional Comments:     Jorey Dollard 09/23/2022, 9:51 PM

## 2022-09-23 NOTE — Progress Notes (Signed)
Patient ID: Travis Sandoval, male   DOB: 1980/06/06, 42 y.o.   MRN: [id number] Pt presents with depressed mood, affect blunted again today.  Pt denies any acute concerns. Continues to wait on placement. He is eating and drinking well, reports no need for miralax again today stating '' my stomach is fine.'' Pt does con't to have poor hygiene and requires prompting to complete his adl's but has not completed despite being given hygiene items and verbal redirection. [name] remains isolative to his room, no interaction with peers noted.  He leaves his room for meals but no interaction with peers or attending groups. Refused to complete self inventory. Pt is safe, denies any SI HI . Will con't to monitor.

## 2022-09-23 NOTE — Group Note (Signed)
Recreation Therapy Group Note   Group Topic:Problem Solving  Group Date: 09/23/2022 Start Time: 1000 End Time: 1055 Facilitators: Rosina Lowenstein, LRT, CTRS Location:  Craft Room  Group Description: Life Boat. Patients were given the scenario that they are on a boat that is about to become shipwrecked, leaving them stranded on an [place]. They are asked to make a list of 15 different items that they want to take with them when they are stranded on the [place]. Patients are asked to rank their items from most important to least important, #1 being the most important and #15 being the least. Patients will work individually for the first round to come up with 15 items and then pair up with a peer(s) to condense their list and come up with one list of 15 items between the two of them. Patients or LRT will read aloud the 15 different items to the group after each round. LRT facilitated post-activity processing to discuss how this activity can be used in daily life post discharge.   Goal Area(s) Addressed:  Patient will identify priorities, wants and needs. Patient will communicate with LRT and peers. Patient will work collectively as a Administrator, Civil Service. Patient will work on Product manager.   Affect/Mood: N/A   Participation Level: Did not attend    Clinical Observations/Individualized Feedback: Avery did not attend group due to resting in his room.  Plan: Continue to engage patient in RT group sessions 2-3x/week.   Rosina Lowenstein, LRT, CTRS 09/23/2022 11:38 AM

## 2022-09-23 NOTE — Plan of Care (Signed)
  Problem: Education: Goal: Knowledge of General Education information will improve Description: Including pain rating scale, medication(s)/side effects and non-pharmacologic comfort measures Outcome: Progressing   Problem: Coping: Goal: Level of anxiety will decrease Outcome: Progressing   Problem: Safety: Goal: Ability to remain free from injury will improve Outcome: Progressing   

## 2022-09-23 NOTE — Progress Notes (Signed)
Patient isolative to self and room. No complaints or concerns voiced. Waiting to hear if he was accepted to facility. Medication compliant. Encouragement and support provided. Safety checks maintained. Meds given as prescribed. Pt receptive and remains safe on unit with q 15 min checks

## 2022-09-24 ENCOUNTER — Other Ambulatory Visit: Payer: Self-pay

## 2022-09-24 LAB — CBC WITH DIFFERENTIAL/PLATELET
Abs Immature Granulocytes: 0.02 10*3/uL (ref 0.00–0.07)
Basophils Absolute: 0.1 10*3/uL (ref 0.0–0.1)
Basophils Relative: 1 %
Eosinophils Absolute: 0.1 10*3/uL (ref 0.0–0.5)
Eosinophils Relative: 2 %
HCT: 46.4 % (ref 39.0–52.0)
Hemoglobin: 15.3 g/dL (ref 13.0–17.0)
Immature Granulocytes: 0 %
Lymphocytes Relative: 37 %
Lymphs Abs: 2.8 10*3/uL (ref 0.7–4.0)
MCH: 27.9 pg (ref 26.0–34.0)
MCHC: 33 g/dL (ref 30.0–36.0)
MCV: 84.5 fL (ref 80.0–100.0)
Monocytes Absolute: 0.4 10*3/uL (ref 0.1–1.0)
Monocytes Relative: 6 %
Neutro Abs: 4.1 10*3/uL (ref 1.7–7.7)
Neutrophils Relative %: 54 %
Platelets: 189 10*3/uL (ref 150–400)
RBC: 5.49 MIL/uL (ref 4.22–5.81)
RDW: 14 % (ref 11.5–15.5)
WBC: 7.5 10*3/uL (ref 4.0–10.5)
nRBC: 0 % (ref 0.0–0.2)

## 2022-09-24 MED ORDER — NICOTINE POLACRILEX 2 MG MT GUM
2.0000 mg | CHEWING_GUM | OROMUCOSAL | 0 refills | Status: AC | PRN
  Filled 2022-09-24: qty 50, 20d supply, fill #0

## 2022-09-24 MED ORDER — CLOZAPINE 100 MG PO TABS: 300.0000 mg | ORAL_TABLET | Freq: Every day | ORAL | 0 refills | Status: DC

## 2022-09-24 MED ORDER — POLYETHYLENE GLYCOL 3350 17 G PO PACK: 17.0000 g | PACK | Freq: Every day | ORAL | 1 refills | Status: DC

## 2022-09-24 MED ORDER — HYDROXYZINE HCL 50 MG PO TABS: 50.0000 mg | ORAL_TABLET | Freq: Four times a day (QID) | ORAL | 1 refills | Status: DC | PRN

## 2022-09-24 MED ORDER — HYDROXYZINE HCL 50 MG PO TABS
50.0000 mg | ORAL_TABLET | Freq: Four times a day (QID) | ORAL | 0 refills | Status: AC | PRN
  Filled 2022-09-24: qty 14, 4d supply, fill #0

## 2022-09-24 MED ORDER — ARIPIPRAZOLE ER 400 MG IM SRER: 400.0000 mg | INTRAMUSCULAR | 1 refills | Status: AC

## 2022-09-24 MED ORDER — SENNOSIDES-DOCUSATE SODIUM 8.6-50 MG PO TABS
2.0000 | ORAL_TABLET | Freq: Every day | ORAL | 0 refills | Status: AC | PRN
  Filled 2022-09-24: qty 14, 7d supply, fill #0

## 2022-09-24 MED ORDER — SENNOSIDES-DOCUSATE SODIUM 8.6-50 MG PO TABS: 2.0000 | ORAL_TABLET | Freq: Every day | ORAL | 1 refills | Status: DC | PRN

## 2022-09-24 MED ORDER — NICOTINE POLACRILEX 2 MG MT GUM: 2.0000 mg | CHEWING_GUM | OROMUCOSAL | 0 refills | Status: DC | PRN

## 2022-09-24 MED ORDER — CLOZAPINE 100 MG PO TABS
300.0000 mg | ORAL_TABLET | Freq: Every day | ORAL | 0 refills | Status: AC
  Filled 2022-09-24 (×2): qty 21, 7d supply, fill #0

## 2022-09-24 MED ORDER — POLYETHYLENE GLYCOL 3350 17 GM/SCOOP PO POWD
17.0000 g | Freq: Every day | ORAL | 0 refills | Status: AC
  Filled 2022-09-24: qty 238, 14d supply, fill #0

## 2022-09-24 NOTE — NC FL2 (Signed)
[place] MEDICAID FL2 LEVEL OF CARE FORM     IDENTIFICATION  Patient Name: Travis Sandoval Birthdate: Sep 04, 1980 Sex: male Admission Date (Current Location): 04/16/2022  [place] and [place] Number:  [address] [id number] [facility] Facility and Address:  [facility], [address], [place], [place] [address]      Provider Number: [id number]  Attending Physician Name and Address:  Audery Amel, MD  Relative Name and Phone Number:  [place] DSS Guardian, [phone]    Current Level of Care: Hospital Recommended Level of Care: Assisted Living Facility Prior Approval Number:    Date Approved/Denied:   PASRR Number:    Discharge Plan: Other (Comment) (Assisted Living)    Current Diagnoses: Patient Active Problem List   Diagnosis Date Noted   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   Attention deficit hyperactivity disorder (ADHD) 04/15/2022    Orientation RESPIRATION BLADDER Height & Weight     Place, Time, Self  Normal Continent Weight: 96.4 kg Height:  5\' 9"  (175.3 cm)  BEHAVIORAL SYMPTOMS/MOOD NEUROLOGICAL BOWEL NUTRITION STATUS   (None)  (None) Continent Diet (Normal)  AMBULATORY STATUS COMMUNICATION OF NEEDS Skin   Independent Verbally Normal                       Personal Care Assistance Level of Assistance   (No assistance needed with ADLs.)           Functional Limitations Info   (None)          SPECIAL CARE FACTORS FREQUENCY   (None)                    Contractures Contractures Info: Not present    Additional Factors Info  Code Status, Allergies Code Status Info: Full Allergies Info: Codiene           Current Medications (09/24/2022):  This is the current hospital active medication list Current Facility-Administered Medications  Medication Dose Route Frequency Provider Last Rate Last Admin   acetaminophen (TYLENOL) tablet 650 mg  650 mg Oral Q6H PRN Gabriel Cirri F, NP   650 mg  at 08/28/22 2109   alum & mag hydroxide-simeth (MAALOX/MYLANTA) 200-200-20 MG/5ML suspension 30 mL  30 mL Oral Q4H PRN Gabriel Cirri F, NP   30 mL at 09/23/22 2104   ARIPiprazole ER (ABILIFY MAINTENA) injection 400 mg  400 mg Intramuscular Q28 days [name], [name], MD   400 mg at 09/19/22 1453   cloZAPine (CLOZARIL) tablet 300 mg  300 mg Oral QHS [name], [name], MD   300 mg at 09/23/22 2104   guaiFENesin (ROBITUSSIN) 100 MG/5ML liquid 5 mL  5 mL Oral Q4H PRN [name], [name], MD   5 mL at 08/22/22 2110   hydrOXYzine (ATARAX) tablet 50 mg  50 mg Oral Q6H PRN [name], [name], MD   50 mg at 08/10/22 2143   magnesium citrate solution 1 Bottle  1 Bottle Oral Daily PRN Jaynie Bream, RPH   1 Bottle at 07/19/22 1643   magnesium hydroxide (MILK OF MAGNESIA) suspension 30 mL  30 mL Oral Daily PRN Jaynie Bream, RPH   30 mL at 09/15/22 2151   mineral oil liquid   Oral Daily PRN Sarina Ill, DO   Given at 07/17/22 1201   nicotine polacrilex (NICORETTE) gum 2 mg  2 mg Oral PRN [name], [name], MD   2 mg at 04/23/22 2200   polyethylene glycol (MIRALAX / GLYCOLAX)  packet 17 g  17 g Oral Daily [name], [name], MD   17 g at 09/12/22 1648   senna-docusate (Senokot-S) tablet 2 tablet  2 tablet Oral Daily PRN [name], [name], MD   2 tablet at 07/14/22 0809     Discharge Medications: Please see discharge summary for a list of discharge medications.  Relevant Imaging Results:  Relevant Lab Results:   Additional Information [name], [place] DSS Guardian, [phone]  Glenis Smoker, LCSW

## 2022-09-24 NOTE — BHH Counselor (Addendum)
CSW received call from [facility] [phone]). CSW was informed that pt had been accepted at their facility, however, they cannot administer Geodon by injection. She asked if it could be switched to oral. CSW informed her that the provider would be updated and she would be contacted to update her. She agreed. No other concerns expressed. Contact ended without incident.   CSW contacted [facility] care coordinator, Alvy Beal to check in. She confirmed that pt had been accepted at this assisted living. She informed CSW that she had tried contacting him. Alvy Beal also shared that this facility really wants pt and the only day that guardian can transport is tomorrow. CSW thanked her for the information. No other concerns expressed. Contact ended without incident.   CSW updated provider regarding this information. CSW informed that pt was only on these as needed for agitation and that pt had not taken it at all while here. Provider shared that this will be discontinued. No other concerns expressed. Contact ended without incident.   CSW contacted [facility] [phone]). She was informed regarding information from provider. She stated that it could not be on the FL2. CSW voiced understanding. CSW inquired where the FL2 could be sent once it was prepared. Marchelle Folks asked that it be emailed to [email or web address]. CSW agreed. No other concerns expressed. Contact ended without incident.   FL2 updated. Awaiting signature.   ADDENDUM: FL2 signed by provider. CSW emailed copy of FL2 to [email or web address] per request.   Vilma Meckel. Algis Greenhouse, MSW, LCSW, LCAS 09/24/2022 2:27 PM

## 2022-09-24 NOTE — BHH Counselor (Addendum)
CSW received call from [facility] DSS guardian, Ladona Ridgel [phone]). She shared that she would be coming to pick pt up tomorrow morning to take him to placement. Ladona Ridgel asked if there was anything that the team needed from her. CSW informed her that there was nothing that was needed. No other concerns expressed. Contact ended without incident.   Vilma Meckel. Algis Greenhouse, MSW, LCSW, LCAS 09/24/2022 3:21 PM

## 2022-09-24 NOTE — Group Note (Signed)
BHH LCSW Group Therapy Note    Group Date: 09/24/2022 Start Time: 1300 End Time: 1400  Type of Therapy and Topic:  Group Therapy:  Overcoming Obstacles  Participation Level:  BHH PARTICIPATION LEVEL: Did Not Attend  Mood:  Description of Group:   In this group patients will be encouraged to explore what they see as obstacles to their own wellness and recovery. They will be guided to discuss their thoughts, feelings, and behaviors related to these obstacles. The group will process together ways to cope with barriers, with attention given to specific choices patients can make. Each patient will be challenged to identify changes they are motivated to make in order to overcome their obstacles. This group will be process-oriented, with patients participating in exploration of their own experiences as well as giving and receiving support and challenge from other group members.  Therapeutic Goals: 1. Patient will identify personal and current obstacles as they relate to admission. 2. Patient will identify barriers that currently interfere with their wellness or overcoming obstacles.  3. Patient will identify feelings, thought process and behaviors related to these barriers. 4. Patient will identify two changes they are willing to make to overcome these obstacles:    Summary of Patient Progress   Patient did not attend group despite encouraged participation.      Therapeutic Modalities:   Cognitive Behavioral Therapy Solution Focused Therapy Motivational Interviewing Relapse Prevention Therapy   Tania Steinhauser W Sherrina Zaugg, LCSWA 

## 2022-09-24 NOTE — BH IP Treatment Plan (Signed)
Interdisciplinary Treatment and Diagnostic Plan Update  09/24/2022 Time of Session: 08:30 Travis Sandoval MRN: [id number]  Principal Diagnosis: Undifferentiated schizophrenia [facility])  Secondary Diagnoses: Principal Problem:   Undifferentiated schizophrenia (HCC)   Current Medications:  Current Facility-Administered Medications  Medication Dose Route Frequency Provider Last Rate Last Admin   acetaminophen (TYLENOL) tablet 650 mg  650 mg Oral Q6H PRN Gabriel Cirri F, NP   650 mg at 08/28/22 2109   alum & mag hydroxide-simeth (MAALOX/MYLANTA) 200-200-20 MG/5ML suspension 30 mL  30 mL Oral Q4H PRN Gabriel Cirri F, NP   30 mL at 09/23/22 2104   ARIPiprazole ER (ABILIFY MAINTENA) injection 400 mg  400 mg Intramuscular Q28 days [name], [name], MD   400 mg at 09/19/22 1453   cloZAPine (CLOZARIL) tablet 300 mg  300 mg Oral QHS [name], [name], MD   300 mg at 09/23/22 2104   guaiFENesin (ROBITUSSIN) 100 MG/5ML liquid 5 mL  5 mL Oral Q4H PRN [name], [name], MD   5 mL at 08/22/22 2110   hydrOXYzine (ATARAX) tablet 50 mg  50 mg Oral Q6H PRN [name], [name], MD   50 mg at 08/10/22 2143   magnesium citrate solution 1 Bottle  1 Bottle Oral Daily PRN Jaynie Bream, RPH   1 Bottle at 07/19/22 1643   magnesium hydroxide (MILK OF MAGNESIA) suspension 30 mL  30 mL Oral Daily PRN Jaynie Bream, RPH   30 mL at 09/15/22 2151   mineral oil liquid   Oral Daily PRN Sarina Ill, DO   Given at 07/17/22 1201   nicotine polacrilex (NICORETTE) gum 2 mg  2 mg Oral PRN [name], [name], MD   2 mg at 04/23/22 2200   polyethylene glycol (MIRALAX / GLYCOLAX) packet 17 g  17 g Oral Daily [name], [name], MD   17 g at 09/12/22 1648   senna-docusate (Senokot-S) tablet 2 tablet  2 tablet Oral Daily PRN [name], [name], MD   2 tablet at 07/14/22 0809   ziprasidone (GEODON) injection 20 mg  20 mg Intramuscular Q12H PRN Reggie Pile, MD       PTA Medications: Medications Prior to Admission  Medication Sig  Dispense Refill Last Dose   ABILIFY MAINTENA 400 MG SRER injection Inject 400 mg into the muscle every 28 (twenty-eight) days.      ARIPiprazole (ABILIFY) 10 MG tablet Take 10 mg by mouth daily. (Patient not taking: Reported on 04/16/2022)      atomoxetine (STRATTERA) 40 MG capsule Take 40 mg by mouth daily. (Patient not taking: Reported on 04/16/2022)      atorvastatin (LIPITOR) 40 MG tablet Take 40 mg by mouth daily.      budesonide-formoterol (SYMBICORT) 160-4.5 MCG/ACT inhaler Inhale 2 puffs into the lungs.      cetirizine (ZYRTEC) 10 MG tablet Take 10 mg by mouth daily.      clozapine (CLOZARIL) 200 MG tablet Take 200 mg by mouth 2 (two) times daily. Take along with one 25 mg tablet for total 225 mg twice daily      cloZAPine (CLOZARIL) 25 MG tablet Take 25 mg by mouth 2 (two) times daily. Take along with one 200 mg tablet for total 225 mg twice daily      Dexlansoprazole (DEXILANT) 30 MG capsule Take 30 mg by mouth daily. (Patient not taking: Reported on 04/16/2022)      hydrOXYzine (VISTARIL) 50 MG capsule Take 50 mg by mouth 3 (three) times daily as needed  for itching. (Patient not taking: Reported on 04/16/2022)      Melatonin 5 MG TABS Take 5 mg by mouth at bedtime as needed (sleep).      metoprolol succinate (TOPROL-XL) 25 MG 24 hr tablet Take 12.5 mg by mouth daily.      Phosphatidyl Choline 65 MG TABS Take 1 tablet by mouth daily.  (Patient not taking: Reported on 04/16/2022)      valproic acid (DEPAKENE) 250 MG/5ML solution Take 750-1,000 mLs by mouth 2 (two) times daily. 1000 mg (20 mL) every morning and 750 mg (15 mL) daily at bedtime       Patient Stressors: Other: Psychosis    Patient Strengths: Ability for insight  Active sense of humor  Average or above average intelligence  Capable of independent living  Supportive family/friends   Treatment Modalities: Medication Management, Group therapy, Case management,  1 to 1 session with clinician, Psychoeducation, Recreational  therapy.   Physician Treatment Plan for Primary Diagnosis: Undifferentiated schizophrenia (HCC) Long Term Goal(s):     Short Term Goals: None, pt is a poor hx.  Medication Management: Evaluate patient's response, side effects, and tolerance of medication regimen.  Therapeutic Interventions: 1 to 1 sessions, Unit Group sessions and Medication administration.  Evaluation of Outcomes: Adequate for Discharge  Physician Treatment Plan for Secondary Diagnosis: Principal Problem:   Undifferentiated schizophrenia (HCC)  Long Term Goal(s):     Short Term Goals: None, pt is a poor hx.     Medication Management: Evaluate patient's response, side effects, and tolerance of medication regimen.  Therapeutic Interventions: 1 to 1 sessions, Unit Group sessions and Medication administration.  Evaluation of Outcomes: Adequate for Discharge   RN Treatment Plan for Primary Diagnosis: Undifferentiated schizophrenia (HCC) Long Term Goal(s): Knowledge of disease and therapeutic regimen to maintain health will improve  Short Term Goals: Ability to remain free from injury will improve, Ability to verbalize frustration and anger appropriately will improve, Ability to demonstrate self-control, Ability to participate in decision making will improve, Ability to verbalize feelings will improve, Ability to disclose and discuss suicidal ideas, Ability to identify and develop effective coping behaviors will improve, and Compliance with prescribed medications will improve  Medication Management: RN will administer medications as ordered by provider, will assess and evaluate patient's response and provide education to patient for prescribed medication. RN will report any adverse and/or side effects to prescribing provider.  Therapeutic Interventions: 1 on 1 counseling sessions, Psychoeducation, Medication administration, Evaluate responses to treatment, Monitor vital signs and CBGs as ordered, Perform/monitor CIWA,  COWS, AIMS and Fall Risk screenings as ordered, Perform wound care treatments as ordered.  Evaluation of Outcomes: Adequate for Discharge   LCSW Treatment Plan for Primary Diagnosis: Undifferentiated schizophrenia (HCC) Long Term Goal(s): Safe transition to appropriate next level of care at discharge, Engage patient in therapeutic group addressing interpersonal concerns.  Short Term Goals: Engage patient in aftercare planning with referrals and resources, Increase social support, Increase ability to appropriately verbalize feelings, Increase emotional regulation, Facilitate acceptance of mental health diagnosis and concerns, Facilitate patient progression through stages of change regarding substance use diagnoses and concerns, Identify triggers associated with mental health/substance abuse issues, and Increase skills for wellness and recovery  Therapeutic Interventions: Assess for all discharge needs, 1 to 1 time with Social worker, Explore available resources and support systems, Assess for adequacy in community support network, Educate family and significant other(s) on suicide prevention, Complete Psychosocial Assessment, Interpersonal group therapy.  Evaluation of Outcomes: Adequate for Discharge  Progress in Treatment: Attending groups: No. Participating in groups: No. Taking medication as prescribed: Yes. Toleration medication: Yes. Family/Significant other contact made: Yes, individual(s) contacted:  father, Brin Scaturro. Patient understands diagnosis: No. Discussing patient identified problems/goals with staff: Yes. Medical problems stabilized or resolved: Yes. Denies suicidal/homicidal ideation: Yes. Issues/concerns per patient self-inventory: No. Other: none.  New problem(s) identified: No, Describe:  none Update 06/30/2021:  No changes at this time. Update 07/06/2022:  No changes at this time. Update 07/11/22: No changes at this time. Update 07/16/22: none. Update 07/21/22: none.  Update 07/26/22: No changes at this time.  Update 07/31/2022:  No changes at this time.  Update 08/05/2022: No change at this time. Update 08/10/22: No changes at this time. Update 08/15/22: No changes at this time. Update 08/20/22: none. Update 08/25/22: none. Update 08/30/22: No changes at this time. Update 09/04/22: No changes at this time. Update 09/09/22: No changes at this time. Update 09/14/22: none. Update 09/19/22: No changes at this time. Update 09/24/22: No changes at this time.   New Short Term/Long Term Goal(s): elimination of symptoms of psychosis, medication management for mood stabilization; elimination of SI thoughts; development of comprehensive mental wellness plan. Update 06/30/2021:  No changes at this time. Update 07/06/2022:  No changes at this time. Update 07/11/22: No changes at this time. Update 07/16/22: Patient to work towards elimination of symptoms of psychosis, medication management for mood stabilization; development of comprehensive mental wellness plan. Update 07/21/22: Patient to work towards elimination of symptoms of psychosis, medication management for mood stabilization; development of comprehensive mental wellness plan. Update 07/26/22: No changes at this time.  Update 07/31/2022:  No changes at this time.  Update 08/05/2022: No change at this time. Update 08/10/22: No changes at this time. Update 08/15/22: No changes at this time. Update 08/20/22:  Patient to work towards elimination of symptoms of psychosis, medication management for mood stabilization; development of comprehensive mental wellness plan. Update 08/25/22: no additional goals identified at this time. Update 08/30/22: No changes at this time. Update 09/04/22: No changes at this time. Update 09/09/22: No changes at this time. Update 09/14/22: Patient to work towards elimination of symptoms of psychosis, medication management for mood stabilization; development of comprehensive mental wellness plan. Update 09/19/22: No changes at this time.  Update 09/24/22: No changes at this time.   Patient Goals:  No additional treatment goals identified by patient. Update 06/30/2021:  No changes at this time. Update 07/06/2022:  No changes at this time. Update 07/11/22: No changes at this time. Update 07/16/22: No additional goals identified at this time. Patient to continue to work towards original goals identified in initial treatment team meeting. CSW will remain available to patient should they voice additional treatment goals. Update 07/21/22: No additional goals identified at this time. Patient to continue to work towards original goals identified in initial treatment team meeting. CSW will remain available to patient should they voice additional treatment goals. Update 07/26/22: No changes at this time.  Update 07/31/2022:  No changes at this time. Update 08/05/2022: No change at this time. Update 08/10/22: No changes at this time. Update 08/15/22: No changes at this time. Update 08/20/22: No additional goals identified at this time. Patient to continue to work towards original goals identified in initial treatment team meeting. CSW will remain available to patient should they voice additional treatment goals. Update 08/25/22: no additional goals identified at this time. Update 08/30/22: No changes at this time. Update 09/04/22: No changes at this time. Update  09/09/22: No changes at this time. Update 09/14/22: No additional goals identified at this time. Patient to continue to work towards original goals identified in initial treatment team meeting. CSW will remain available to patient should they voice additional treatment goals. Update 09/19/22: No changes at this time. Update 09/24/22: No changes at this time.   Discharge Plan or Barriers: Patient remains on the unit as placement continues to be sought. Funding remains the concern at this time and biggest barrier to placement at this time. Update 06/30/2021:  No changes at this time. Update 07/06/2022:  No changes at this  time. Update 07/11/22: No changes at this time. Update 07/16/22: Patient lacks funding for placement, [employer] rep continues to communicate with social security to restart payment. Patient has guardianship hearing on this day. Situation ongoing, CSW will continue to monitor and update note as more information becomes available. Update 07/21/22: Patient lacks adequate funding, CSW to continue pursing payor source to secure placement. Patient is unable to living independently per psychiatrist. Update 07/26/22: No changes at this time. Update 07/31/2022:  No changes at this time.  Update 08/05/2022: No change at this time. Update 08/10/22: No changes at this time. Update 08/15/22: No changes at this time. Update 08/20/22: Patient lack funding to procure group home placement. Per note on 4/29, "CSW reached [facility] DSS regarding update on payee application. [name], DSS Guardian Supervisor, at [phone] ext. [phone] reports the patient has been awarded [employer], although the social security office has not yet awarded an amount. CSW clarified if DSS has been appointed as the patient's payee at this time. [name] reports they have not been appointed as the payee yet. CSW encouraged DSS to apply or have someone appointed payee as soon as possible in order to avoid any delays once social security has determined an amount." Update 08/25/22: Patient is waiting on insurance. Update 08/30/22: Pt has an interview for potential placement option today at 10:30. CSW to assist with virtual interview. Update 09/04/22: Placement search continues. Pt participated in an interview yesterday and team is awaiting determination. Previous interview which had been arranged for 08/30/22 fell through as facility staff member did not enter the call. Update 09/09/22: Pt took place in an interview last week and information was sent to [facility]. Awaiting to hear about decision from interview and will contact to follow up regarding information  sent over to [facility]. Update 09/14/22: Patient lacks adequate funding for placement, currently DSS is attempting to become patient's payee to become. Update 09/19/22: Pt has upcoming interview for potential placement through [facility]. [employer] liaison to assist with interview process. Update 09/24/22: Pt participated in interview with [facility] for placement last week and did well. However, [facility] declined pt placement through their facility. Efforts to seek placement continue.    Reason for Continuation of Hospitalization: Pt is boarding.   Estimated Length of Stay: TBD Update 07/06/2022:  TBD Update 07/11/22: No changes at this time. Update 07/16/22: TBD. Update 07/21/22: TBD. Update 07/26/22: No changes at this time.  Update 07/31/2022:  No changes at this time.  Update 08/05/2022: No change at this time. Update 08/10/22: No changes at this time. Update 08/15/22: No changes at this time. Update 08/20/22: TBD. Update 08/25/22: TBD. Update 08/30/22: No changes at this time. Update 09/04/22: No changes at this time. Update 09/09/22: No changes at this time. Update 09/14/22: TBD Update 09/19/22: No changes at this time. Update 09/24/22: No changes at this time.  Last 3 [place] Suicide Severity Risk Score: Flowsheet Row Admission (Current) from 04/16/2022 in [facility] INPATIENT BEHAVIORAL MEDICINE ED from 04/15/2022 in [facility] Emergency Department at [facility]  C-SSRS RISK CATEGORY No Risk No Risk       Last PHQ 2/9 Scores:     No data to display          Scribe for Treatment Team: Glenis Smoker, LCSW 09/24/2022 9:25 AM

## 2022-09-24 NOTE — BHH Suicide Risk Assessment (Signed)
[facility] Discharge Suicide Risk Assessment   Principal Problem: Undifferentiated schizophrenia (HCC) Discharge Diagnoses: Principal Problem:   Undifferentiated schizophrenia (HCC)   Total Time spent with patient: 30 minutes  Musculoskeletal: Strength & Muscle Tone: within normal limits Gait & Station: normal Patient leans: N/A  Psychiatric Specialty Exam  Presentation  General Appearance:  Disheveled  Eye Contact: Minimal  Speech: Pressured  Speech Volume: Normal  Handedness: Right   Mood and Affect  Mood: Euphoric; Irritable  Duration of Depression Symptoms: No data recorded Affect: Inappropriate   Thought Process  Thought Processes: Disorganized  Descriptions of Associations:Loose  Orientation:Partial  Thought Content:Illogical; Scattered  History of Schizophrenia/Schizoaffective disorder:No data recorded Duration of Psychotic Symptoms:No data recorded Hallucinations:No data recorded Ideas of Reference:Delusions; Paranoia  Suicidal Thoughts:No data recorded Homicidal Thoughts:No data recorded  Sensorium  Memory: Immediate Poor; Recent Poor; Remote Poor  Judgment: Poor  Insight: Poor   Executive Functions  Concentration: Fair  Attention Span: Fair  Recall: Poor  Fund of Knowledge: Poor  Language: Poor   Psychomotor Activity  Psychomotor Activity:No data recorded  Assets  Assets: Desire for Improvement; Social Support   Sleep  Sleep:No data recorded  Physical Exam: Physical Exam Vitals reviewed.  Constitutional:      Appearance: Normal appearance.  HENT:     Head: Normocephalic and atraumatic.     Mouth/Throat:     Pharynx: Oropharynx is clear.  Eyes:     Pupils: Pupils are equal, round, and reactive to light.  Cardiovascular:     Rate and Rhythm: Normal rate and regular rhythm.  Pulmonary:     Effort: Pulmonary effort is normal.     Breath sounds: Normal breath sounds.  Abdominal:     General: Abdomen is  flat.     Palpations: Abdomen is soft.  Musculoskeletal:        General: Normal range of motion.  Skin:    General: Skin is warm and dry.  Neurological:     General: No focal deficit present.     Mental Status: He is alert. Mental status is at baseline.  Psychiatric:        Mood and Affect: Mood normal.        Thought Content: Thought content normal.    Review of Systems  Constitutional: Negative.   HENT: Negative.    Eyes: Negative.   Respiratory: Negative.    Cardiovascular: Negative.   Gastrointestinal: Negative.   Musculoskeletal: Negative.   Skin: Negative.   Neurological: Negative.   Psychiatric/Behavioral: Negative.     Blood pressure 108/76, pulse 91, temperature 97.7 F (36.5 C), temperature source Oral, resp. rate 17, height 5\' 9"  (1.753 m), weight 96.4 kg, SpO2 97 %. Body mass index is 31.38 kg/m.  Mental Status Per Nursing Assessment::   On Admission:  NA  Demographic Factors:  Male  Loss Factors: NA  Historical Factors: Impulsivity  Risk Reduction Factors:   Positive therapeutic relationship  Continued Clinical Symptoms:  Schizophrenia:   Paranoid or undifferentiated type  Cognitive Features That Contribute To Risk:  None    Suicide Risk:  Minimal: No identifiable suicidal ideation.  Patients presenting with no risk factors but with morbid ruminations; may be classified as minimal risk based on the severity of the depressive symptoms    Plan Of Care/Follow-up recommendations:  Other:  Patient is not showing any dangerous behavior and has consistently denied suicidal ideation.  Does not appear to be in any way an acute danger to himself or others.  Very  stable and cooperative with medicine.  Plan is for discharge tomorrow to a monitored living facility with continued medicine management.  Mordecai Rasmussen, MD 09/24/2022, 2:26 PM

## 2022-09-24 NOTE — Group Note (Signed)
Date:  09/24/2022 Time:  9:15 PM  Group Topic/Focus:  Wrap-Up Group:   The focus of this group is to help patients review their daily goal of treatment and discuss progress on daily workbooks.    Participation Level:  Minimal  Participation Quality:  Appropriate  Affect:  Appropriate  Cognitive:  Appropriate  Insight: Appropriate  Engagement in Group:  Engaged  Modes of Intervention:  Discussion  Additional Comments:     Maglione,Llewellyn Choplin E 09/24/2022, 9:15 PM

## 2022-09-24 NOTE — Plan of Care (Signed)
Patient isolates to his room except for meals. Minimal interaction with staff & peers. With prompting patient had shower and changed cloths. Denies SI,HI and AVH. Appetite and energy level good. Support and encouragement given.

## 2022-09-24 NOTE — Group Note (Signed)
Recreation Therapy Group Note   Group Topic:Goal Setting  Group Date: 09/24/2022 Start Time: 1000 End Time: 1105 Facilitators: Rosina Lowenstein, LRT, CTRS Location:  Craft Room  Group Description: Scientist, research (physical sciences). Patients were given many different magazines, a glue stick, markers, and a piece of cardstock paper. LRT and pts discussed the importance of having goals in life. LRT and pts discussed the difference between short-term and long-term goals, as well as what a SMART goal is. LRT encouraged pts to create a vision board, with images they picked and then cut out with safety scissors from the magazine, for themselves, that capture their short and long-term goals. LRT encouraged pts to show and explain their vision board to the group. LRT offered to laminate vision board once dry and complete.   Goal Area(s) Addressed:  Patient will gain knowledge of short vs. long term goals.  Patient will identify goals for themselves. Patient will practice setting SMART goals. Patient will verbalize their goals to LRT and peers.  Affect/Mood: N/A   Participation Level: Did not attend    Clinical Observations/Individualized Feedback: Demarien did not attend group due to resting in his room.  Plan: Continue to engage patient in RT group sessions 2-3x/week.   Rosina Lowenstein, LRT, CTRS 09/24/2022 11:48 AM

## 2022-09-24 NOTE — Group Note (Signed)
Date:  09/24/2022 Time:  9:34 AM  Group Topic/Focus:  Mindfulness Group    Participation Level:  Did Not Attend   Armilda Vanderlinden M Arsenia Goracke 09/24/2022, 9:34 AM  

## 2022-09-24 NOTE — Group Note (Signed)
Date:  09/24/2022 Time:  4:20 PM  Group Topic/Focus:  Activity Group    Participation Level:  Active  Participation Quality:  Appropriate  Affect:  Appropriate  Cognitive:  Appropriate  Insight: Appropriate  Engagement in Group:  Engaged  Modes of Intervention:  Activity  Additional Comments:    Mary Sella Andren Bethea 09/24/2022, 4:20 PM

## 2022-09-24 NOTE — Progress Notes (Signed)
[facility] MD Progress Note  09/24/2022 2:15 PM Travis Sandoval  MRN:  [id number] Subjective: Patient seen and chart reviewed.  Patient has no new complaints.  Clinical state unchanged.  No medical issues.  We learned this morning that he had been declined by the [facility] facility but then learned this afternoon that somehow he had been accepted by another facility that wants to take him tomorrow. Principal Problem: Undifferentiated schizophrenia (HCC) Diagnosis: Principal Problem:   Undifferentiated schizophrenia (HCC)  Total Time spent with patient: 30 minutes  Past Psychiatric History: History of schizophrenia  Past Medical History:  Past Medical History:  Diagnosis Date   ADHD    Bipolar 1 disorder (HCC)    Hepatitis C    Schizoaffective disorder (HCC)    History reviewed. No pertinent surgical history. Family History:  Family History  Family history unknown: Yes   Family Psychiatric  History: See previous Social History:  Social History   Substance and Sexual Activity  Alcohol Use Not Currently     Social History   Substance and Sexual Activity  Drug Use Not Currently    Social History   Socioeconomic History   Marital status: Unknown    Spouse name: Not on file   Number of children: Not on file   Years of education: Not on file   Highest education level: Not on file  Occupational History   Not on file  Tobacco Use   Smoking status: Every Day    Packs/day: 0.25    Years: 15.00    Additional pack years: 0.00    Total pack years: 3.75    Types: Cigarettes   Smokeless tobacco: Not on file  Vaping Use   Vaping Use: Not on file  Substance and Sexual Activity   Alcohol use: Not Currently   Drug use: Not Currently   Sexual activity: Not Currently  Other Topics Concern   Not on file  Social History Narrative   Not on file   Social Determinants of Health   Financial Resource Strain: Not on file  Food Insecurity: Patient Declined (05/18/2022)   Hunger Vital  Sign    Worried About Running Out of Food in the Last Year: Patient declined    Ran Out of Food in the Last Year: Patient declined  Transportation Needs: Patient Declined (05/18/2022)   PRAPARE - Administrator, Civil Service (Medical): Patient declined    Lack of Transportation (Non-Medical): Patient declined  Physical Activity: Not on file  Stress: Not on file  Social Connections: Not on file   Additional Social History:                         Sleep: Fair  Appetite:  Fair  Current Medications: Current Facility-Administered Medications  Medication Dose Route Frequency Provider Last Rate Last Admin   acetaminophen (TYLENOL) tablet 650 mg  650 mg Oral Q6H PRN Gabriel Cirri F, NP   650 mg at 08/28/22 2109   alum & mag hydroxide-simeth (MAALOX/MYLANTA) 200-200-20 MG/5ML suspension 30 mL  30 mL Oral Q4H PRN Gabriel Cirri F, NP   30 mL at 09/23/22 2104   ARIPiprazole ER (ABILIFY MAINTENA) injection 400 mg  400 mg Intramuscular Q28 days Breonna Gafford T, MD   400 mg at 09/19/22 1453   cloZAPine (CLOZARIL) tablet 300 mg  300 mg Oral QHS Donetta Isaza T, MD   300 mg at 09/23/22 2104   guaiFENesin (ROBITUSSIN) 100 MG/5ML liquid 5  mL  5 mL Oral Q4H PRN Amedee Cerrone T, MD   5 mL at 08/22/22 2110   hydrOXYzine (ATARAX) tablet 50 mg  50 mg Oral Q6H PRN Ashlie Mcmenamy, [name], MD   50 mg at 08/10/22 2143   magnesium citrate solution 1 Bottle  1 Bottle Oral Daily PRN Jaynie Bream, RPH   1 Bottle at 07/19/22 1643   magnesium hydroxide (MILK OF MAGNESIA) suspension 30 mL  30 mL Oral Daily PRN Jaynie Bream, RPH   30 mL at 09/15/22 2151   mineral oil liquid   Oral Daily PRN Sarina Ill, DO   Given at 07/17/22 1201   nicotine polacrilex (NICORETTE) gum 2 mg  2 mg Oral PRN Malvika Tung, [name], MD   2 mg at 04/23/22 2200   polyethylene glycol (MIRALAX / GLYCOLAX) packet 17 g  17 g Oral Daily Makynleigh Breslin, [name], MD   17 g at 09/12/22 1648   senna-docusate (Senokot-S)  tablet 2 tablet  2 tablet Oral Daily PRN Shwanda Soltis, [name], MD   2 tablet at 07/14/22 0809    Lab Results: No results found for this or any previous visit (from the past 48 hour(s)).  Blood Alcohol level:  Lab Results  Component Value Date   ETH <10 04/15/2022    Metabolic Disorder Labs: Lab Results  Component Value Date   HGBA1C 5.5 04/24/2022   MPG 111 04/24/2022   No results found for: "PROLACTIN" Lab Results  Component Value Date   CHOL 220 (H) 04/24/2022   TRIG 700 (H) 04/24/2022   HDL 35 (L) 04/24/2022   CHOLHDL 6.3 04/24/2022   VLDL UNABLE TO CALCULATE IF TRIGLYCERIDE OVER 400 mg/dL 16/01/9603   LDLCALC UNABLE TO CALCULATE IF TRIGLYCERIDE OVER 400 mg/dL 54/12/8117    Physical Findings: AIMS: Facial and Oral Movements Muscles of Facial Expression: None, normal Lips and Perioral Area: None, normal Jaw: None, normal Tongue: None, normal,Extremity Movements Upper (arms, wrists, hands, fingers): None, normal Lower (legs, knees, ankles, toes): None, normal, Trunk Movements Neck, shoulders, hips: None, normal, Overall Severity Severity of abnormal movements (highest score from questions above): None, normal Incapacitation due to abnormal movements: None, normal Patient's awareness of abnormal movements (rate only patient's report): No Awareness, Dental Status Current problems with teeth and/or dentures?: No Does patient usually wear dentures?: No  CIWA:    COWS:     Musculoskeletal: Strength & Muscle Tone: within normal limits Gait & Station: normal Patient leans: N/A  Psychiatric Specialty Exam:  Presentation  General Appearance:  Disheveled  Eye Contact: Minimal  Speech: Pressured  Speech Volume: Normal  Handedness: Right   Mood and Affect  Mood: Euphoric; Irritable  Affect: Inappropriate   Thought Process  Thought Processes: Disorganized  Descriptions of Associations:Loose  Orientation:Partial  Thought Content:Illogical;  Scattered  History of Schizophrenia/Schizoaffective disorder:No data recorded Duration of Psychotic Symptoms:No data recorded Hallucinations:No data recorded Ideas of Reference:Delusions; Paranoia  Suicidal Thoughts:No data recorded Homicidal Thoughts:No data recorded  Sensorium  Memory: Immediate Poor; Recent Poor; Remote Poor  Judgment: Poor  Insight: Poor   Executive Functions  Concentration: Fair  Attention Span: Fair  Recall: Poor  Fund of Knowledge: Poor  Language: Poor   Psychomotor Activity  Psychomotor Activity:No data recorded  Assets  Assets: Desire for Improvement; Social Support   Sleep  Sleep:No data recorded   Physical Exam: Physical Exam Vitals and nursing note reviewed.  Constitutional:      Appearance: Normal appearance.  HENT:  Head: Normocephalic and atraumatic.     Mouth/Throat:     Pharynx: Oropharynx is clear.  Eyes:     Pupils: Pupils are equal, round, and reactive to light.  Cardiovascular:     Rate and Rhythm: Normal rate and regular rhythm.  Pulmonary:     Effort: Pulmonary effort is normal.     Breath sounds: Normal breath sounds.  Abdominal:     General: Abdomen is flat.     Palpations: Abdomen is soft.  Musculoskeletal:        General: Normal range of motion.  Skin:    General: Skin is warm and dry.  Neurological:     General: No focal deficit present.     Mental Status: He is alert. Mental status is at baseline.  Psychiatric:        Mood and Affect: Mood normal.        Thought Content: Thought content normal.    Review of Systems  Constitutional: Negative.   HENT: Negative.    Eyes: Negative.   Respiratory: Negative.    Cardiovascular: Negative.   Gastrointestinal: Negative.   Musculoskeletal: Negative.   Skin: Negative.   Neurological: Negative.   Psychiatric/Behavioral: Negative.     Blood pressure 108/76, pulse 91, temperature 97.7 F (36.5 C), temperature source Oral, resp. rate 17,  height 5\' 9"  (1.753 m), weight 96.4 kg, SpO2 97 %. Body mass index is 31.38 kg/m.   Treatment Plan Summary: Plan patient has been ready for discharge for quite a while.  I am making preparations ordering a supply of medicine and preparing prescriptions.  Ordering another CBC today so that pharmacy can potentially give a 7-day supply of clozapine at discharge.  Mordecai Rasmussen, MD 09/24/2022, 2:15 PM

## 2022-09-24 NOTE — Progress Notes (Signed)
Patient calm and pleasant during assessment denying SI/HI/AVH. Pt observed interacting appropriately with staff and peers on the unit. Pt compliant with medication administration per MD orders. Pt given education, support, and encouragement to be active in his treatment plan. Pt being monitored Q 15 minutes for safety per unit protocol, remains safe on the unit  

## 2022-09-25 LAB — CBC WITH DIFFERENTIAL/PLATELET
Abs Immature Granulocytes: 0.01 10*3/uL (ref 0.00–0.07)
Basophils Absolute: 0.1 10*3/uL (ref 0.0–0.1)
Basophils Relative: 1 %
Eosinophils Absolute: 0.1 10*3/uL (ref 0.0–0.5)
Eosinophils Relative: 2 %
HCT: 43.7 % (ref 39.0–52.0)
Hemoglobin: 14.3 g/dL (ref 13.0–17.0)
Immature Granulocytes: 0 %
Lymphocytes Relative: 39 %
Lymphs Abs: 2.6 10*3/uL (ref 0.7–4.0)
MCH: 27.9 pg (ref 26.0–34.0)
MCHC: 32.7 g/dL (ref 30.0–36.0)
MCV: 85.4 fL (ref 80.0–100.0)
Monocytes Absolute: 0.5 10*3/uL (ref 0.1–1.0)
Monocytes Relative: 8 %
Neutro Abs: 3.3 10*3/uL (ref 1.7–7.7)
Neutrophils Relative %: 50 %
Platelets: 169 10*3/uL (ref 150–400)
RBC: 5.12 MIL/uL (ref 4.22–5.81)
RDW: 14 % (ref 11.5–15.5)
WBC: 6.6 10*3/uL (ref 4.0–10.5)
nRBC: 0 % (ref 0.0–0.2)

## 2022-09-25 NOTE — Progress Notes (Signed)
Patient pleasant and cooperative on approach. Denies SI,HI and AVH. Verbalized understanding discharge instructions,prescriptions and follow up care. 7 days medicines given to patient. All belongings returned from [employer]. Patient refused to fill the Suicide safety plan. Patient escorted out by staff and transported by guardian.

## 2022-09-25 NOTE — Discharge Summary (Signed)
Physician Discharge Summary Note  Patient:  Travis Sandoval is an 42 y.o., male MRN:  [id number] DOB:  [birth year] Patient phone:  [phone] (home)  Patient address:   [place] [address],  Total Time spent with patient: 30 minutes  Date of Admission:  04/16/2022 Date of Discharge: 09/25/2022  Reason for Admission: Patient was admitted to the hospital through the emergency room where he had presented with psychotic symptoms and having eloped from his most recent group home.  Principal Problem: Undifferentiated schizophrenia [facility]) Discharge Diagnoses: Principal Problem:   Undifferentiated schizophrenia (HCC)   Past Psychiatric History: Patient has a long established history of schizophrenia with multiple hospitalizations.  Past Medical History:  Past Medical History:  Diagnosis Date   ADHD    Bipolar 1 disorder (HCC)    Hepatitis C    Schizoaffective disorder (HCC)    History reviewed. No pertinent surgical history. Family History:  Family History  Family history unknown: Yes   Family Psychiatric  History: None reported Social History:  Social History   Substance and Sexual Activity  Alcohol Use Not Currently     Social History   Substance and Sexual Activity  Drug Use Not Currently    Social History   Socioeconomic History   Marital status: Unknown    Spouse name: Not on file   Number of children: Not on file   Years of education: Not on file   Highest education level: Not on file  Occupational History   Not on file  Tobacco Use   Smoking status: Every Day    Packs/day: 0.25    Years: 15.00    Additional pack years: 0.00    Total pack years: 3.75    Types: Cigarettes   Smokeless tobacco: Not on file  Vaping Use   Vaping Use: Not on file  Substance and Sexual Activity   Alcohol use: Not Currently   Drug use: Not Currently   Sexual activity: Not Currently  Other Topics Concern   Not on file  Social History Narrative   Not on file    Social Determinants of Health   Financial Resource Strain: Not on file  Food Insecurity: Patient Declined (05/18/2022)   Hunger Vital Sign    Worried About Running Out of Food in the Last Year: Patient declined    Ran Out of Food in the Last Year: Patient declined  Transportation Needs: Patient Declined (05/18/2022)   PRAPARE - Administrator, Civil Service (Medical): Patient declined    Lack of Transportation (Non-Medical): Patient declined  Physical Activity: Not on file  Stress: Not on file  Social Connections: Not on file    Hospital Course: Admitted to psychiatric hospital.  15-minute checks continued.  Restarted on medicines consistent with outpatient treatment including clozapine.  Clozapine dose adjusted slightly on levels checked during the time in the hospital.  Current level is in the therapeutic range and seems to be well-tolerated.  Patient consistently when asked will discuss delusional beliefs about being a federal agent but his day-to-day behavior has been calm and cooperative.  He stays mostly to himself although occasionally will go outdoors for activities and has been coming out for meals consistently.  Hygiene has been acceptable.  Medicine compliance is good.  Patient had an extended hospitalization do not to his symptoms but to problems with financing and finding a safe and reasonable place for him to stay.  At this time his new guardian seems to have discovered an appropriate facility  that is willing to take him and the plan is for him to be discharged.  I have arranged for him to have a supply of all of his meds including the Clozapine and have prescriptions for all of his medicines.  He will need close outpatient continued follow-up for his schizophrenia.  No sign of dangerousness at discharge.  Patient understands and is agreeable to the plan.  Physical Findings: AIMS: Facial and Oral Movements Muscles of Facial Expression: None, normal Lips and Perioral  Area: None, normal Jaw: None, normal Tongue: None, normal,Extremity Movements Upper (arms, wrists, hands, fingers): None, normal Lower (legs, knees, ankles, toes): None, normal, Trunk Movements Neck, shoulders, hips: None, normal, Overall Severity Severity of abnormal movements (highest score from questions above): None, normal Incapacitation due to abnormal movements: None, normal Patient's awareness of abnormal movements (rate only patient's report): No Awareness, Dental Status Current problems with teeth and/or dentures?: No Does patient usually wear dentures?: No  CIWA:    COWS:     Musculoskeletal: Strength & Muscle Tone: within normal limits Gait & Station: normal Patient leans: N/A   Psychiatric Specialty Exam:  Presentation  General Appearance:  Disheveled  Eye Contact: Minimal  Speech: Pressured  Speech Volume: Normal  Handedness: Right   Mood and Affect  Mood: Euphoric; Irritable  Affect: Inappropriate   Thought Process  Thought Processes: Disorganized  Descriptions of Associations:Loose  Orientation:Partial  Thought Content:Illogical; Scattered  History of Schizophrenia/Schizoaffective disorder:No data recorded Duration of Psychotic Symptoms:No data recorded Hallucinations:No data recorded Ideas of Reference:Delusions; Paranoia  Suicidal Thoughts:No data recorded Homicidal Thoughts:No data recorded  Sensorium  Memory: Immediate Poor; Recent Poor; Remote Poor  Judgment: Poor  Insight: Poor   Executive Functions  Concentration: Fair  Attention Span: Fair  Recall: Poor  Fund of Knowledge: Poor  Language: Poor   Psychomotor Activity  Psychomotor Activity:No data recorded  Assets  Assets: Desire for Improvement; Social Support   Sleep  Sleep:No data recorded   Physical Exam: Physical Exam Vitals and nursing note reviewed.  Constitutional:      Appearance: Normal appearance.  HENT:     Head:  Normocephalic and atraumatic.     Mouth/Throat:     Pharynx: Oropharynx is clear.  Eyes:     Pupils: Pupils are equal, round, and reactive to light.  Cardiovascular:     Rate and Rhythm: Normal rate and regular rhythm.  Pulmonary:     Effort: Pulmonary effort is normal.     Breath sounds: Normal breath sounds.  Abdominal:     General: Abdomen is flat.     Palpations: Abdomen is soft.  Musculoskeletal:        General: Normal range of motion.  Skin:    General: Skin is warm and dry.  Neurological:     General: No focal deficit present.     Mental Status: He is alert. Mental status is at baseline.  Psychiatric:        Attention and Perception: Attention normal.        Mood and Affect: Mood normal.        Speech: Speech normal.        Behavior: Behavior normal.        Thought Content: Thought content is delusional.        Cognition and Memory: Cognition normal.        Judgment: Judgment normal.    Review of Systems  Constitutional: Negative.   HENT: Negative.    Eyes: Negative.   Respiratory:  Negative.    Cardiovascular: Negative.   Gastrointestinal: Negative.   Musculoskeletal: Negative.   Skin: Negative.   Neurological: Negative.   Psychiatric/Behavioral: Negative.     Blood pressure 100/73, pulse 97, temperature 98.2 F (36.8 C), temperature source Oral, resp. rate 18, height 5\' 9"  (1.753 m), weight 96.4 kg, SpO2 98 %. Body mass index is 31.38 kg/m.   Social History   Tobacco Use  Smoking Status Every Day   Packs/day: 0.25   Years: 15.00   Additional pack years: 0.00   Total pack years: 3.75   Types: Cigarettes  Smokeless Tobacco Not on file   Tobacco Cessation:  A prescription for an FDA-approved tobacco cessation medication provided at discharge   Blood Alcohol level:  Lab Results  Component Value Date   ETH <10 04/15/2022    Metabolic Disorder Labs:  Lab Results  Component Value Date   HGBA1C 5.5 04/24/2022   MPG 111 04/24/2022   No results  found for: "PROLACTIN" Lab Results  Component Value Date   CHOL 220 (H) 04/24/2022   TRIG 700 (H) 04/24/2022   HDL 35 (L) 04/24/2022   CHOLHDL 6.3 04/24/2022   VLDL UNABLE TO CALCULATE IF TRIGLYCERIDE OVER 400 mg/dL 16/01/9603   LDLCALC UNABLE TO CALCULATE IF TRIGLYCERIDE OVER 400 mg/dL 54/12/8117    See Psychiatric Specialty Exam and Suicide Risk Assessment completed by Attending Physician prior to discharge.  Discharge destination:  Home  Is patient on multiple antipsychotic therapies at discharge:  Yes,   Do you recommend tapering to monotherapy for antipsychotics?  No   Has Patient had three or more failed trials of antipsychotic monotherapy by history:  Yes,   Antipsychotic medications that previously failed include:   1.  Abilify., 2.  Clozapine., and 3.  Invega.  Recommended Plan for Multiple Antipsychotic Therapies: Second antipsychotic is Clozapine.  Reason for adding Clozapine patient is stable on his current combination of clozapine and long-acting injectable with no reason to stop medicine.  Discharge Instructions     Diet - low sodium heart healthy   Complete by: As directed    Diet - low sodium heart healthy   Complete by: As directed    Increase activity slowly   Complete by: As directed    Increase activity slowly   Complete by: As directed       Allergies as of 09/25/2022       Reactions   Codeine Other (See Comments)   Causes fever        Medication List     STOP taking these medications    atomoxetine 40 MG capsule Commonly known as: STRATTERA   atorvastatin 40 MG tablet Commonly known as: LIPITOR   budesonide-formoterol 160-4.5 MCG/ACT inhaler Commonly known as: SYMBICORT   cetirizine 10 MG tablet Commonly known as: ZYRTEC   Dexilant 30 MG capsule DR Generic drug: Dexlansoprazole   hydrOXYzine 50 MG capsule Commonly known as: VISTARIL   melatonin 5 MG Tabs   metoprolol succinate 25 MG 24 hr tablet Commonly known as: TOPROL-XL    Phosphatidyl Choline 65 MG Tabs   valproic acid 250 MG/5ML solution Commonly known as: DEPAKENE       TAKE these medications      Indication  ARIPiprazole ER 400 MG Srer injection Commonly known as: ABILIFY MAINTENA Inject 2 mLs (400 mg total) into the muscle every 28 (twenty-eight) days. Start taking on: October 17, 2022 What changed: Another medication with the same name was removed. Continue taking this  medication, and follow the directions you see here.  Indication: Schizophrenia   cloZAPine 100 MG tablet Commonly known as: CLOZARIL Take 3 tablets (300 mg total) by mouth at bedtime. What changed:  medication strength how much to take when to take this additional instructions Another medication with the same name was removed. Continue taking this medication, and follow the directions you see here.  Indication: Schizophrenia   hydrOXYzine 50 MG tablet Commonly known as: ATARAX Take 1 tablet (50 mg total) by mouth every 6 (six) hours as needed for anxiety.  Indication: Feeling Anxious   nicotine polacrilex 2 MG gum Commonly known as: NICORETTE Take 1 each (2 mg total) by mouth as needed for smoking cessation.  Indication: Nicotine Addiction   polyethylene glycol powder 17 GM/SCOOP powder Commonly known as: GLYCOLAX/MIRALAX Take 17 g by mouth daily. Mix powder as directed.  Indication: Constipation   senna-docusate 8.6-50 MG tablet Commonly known as: Senokot-S Take 2 tablets by mouth daily as needed for mild constipation.  Indication: Constipation        Follow-up Information     [facility] Follow up.   Why: Medication management will be continued through psych provider, Dellis Filbert, NP, who provides care to clients in this facility. Contact information: [address] [place], [place] [address] Phone: [phone]                Follow-up recommendations:  Other:  Follow-up with local mental health agency through his new living  facility.  Supply of medicine and prescriptions provided.  Comments: See above  Signed: Mordecai Rasmussen, MD 09/25/2022, 10:25 AM

## 2022-09-25 NOTE — Consult Note (Signed)
Pharmacy Consult - Clozapine     42 yo male  This patient's order has been reviewed for prescribing contraindications.    Clozapine [facility] enrollment Verified: yes on 04/17/22 [facility] patient ID: [id number] Current Outpatient Monitoring: Every 2 weeks   Home Regimen:  225 mg po BID Last dose: unknown   Dose Adjustments This Admission: Dose started at clozapine 100 mg po daily Dose is currently 300mg  qHS changed 08/04/2022   Labs: Date    ANC    Submitted? 12/27 2800 Yes 01/03 3500 Yes 01/10   3100 Yes 01/24 3000 Yes 01/31 4400 yes   02/07 4300 yes 02/14 4500 Yes 02/21 5500 Yes 02/28 4080 Yes 03/06 4300 Yes  03/13 4100 Yes 03/20 4800 Yes 03/27 4800 Yes 04/03 4200 Yes 04/10 5400 Yes 04/17 3570 Yes 04/24 4000 Yes 05/01 4200 0508 2900 Yes 05/15 3400  Yes 05/22 3000 Yes 05/29 3082 Yes 06/04 4100 Yes  Plan: Continue clozapine 300 mg po HS Monitor ANC at least weekly while inpatient. Next CBC with differential on June/5/24.   Barrie Folk, PharmD 09/25/2022 9:55 AM

## 2022-09-25 NOTE — [facility] Suicide Risk Assessment (Signed)
[facility] Discharge Suicide Risk Assessment   Principal Problem: Undifferentiated schizophrenia (HCC) Discharge Diagnoses: Principal Problem:   Undifferentiated schizophrenia (HCC)   Total Time spent with patient: 30 minutes  Musculoskeletal: Strength & Muscle Tone: within normal limits Gait & Station: normal Patient leans: N/A  Psychiatric Specialty Exam  Presentation  General Appearance:  Disheveled  Eye Contact: Minimal  Speech: Pressured  Speech Volume: Normal  Handedness: Right   Mood and Affect  Mood: Euphoric; Irritable  Duration of Depression Symptoms: No data recorded Affect: Inappropriate   Thought Process  Thought Processes: Disorganized  Descriptions of Associations:Loose  Orientation:Partial  Thought Content:Illogical; Scattered  History of Schizophrenia/Schizoaffective disorder:No data recorded Duration of Psychotic Symptoms:No data recorded Hallucinations:No data recorded Ideas of Reference:Delusions; Paranoia  Suicidal Thoughts:No data recorded Homicidal Thoughts:No data recorded  Sensorium  Memory: Immediate Poor; Recent Poor; Remote Poor  Judgment: Poor  Insight: Poor   Executive Functions  Concentration: Fair  Attention Span: Fair  Recall: Poor  Fund of Knowledge: Poor  Language: Poor   Psychomotor Activity  Psychomotor Activity:No data recorded  Assets  Assets: Desire for Improvement; Social Support   Sleep  Sleep:No data recorded  Physical Exam: Physical Exam Vitals and nursing note reviewed.  Constitutional:      Appearance: Normal appearance.  HENT:     Head: Normocephalic and atraumatic.     Mouth/Throat:     Pharynx: Oropharynx is clear.  Eyes:     Pupils: Pupils are equal, round, and reactive to light.  Cardiovascular:     Rate and Rhythm: Normal rate and regular rhythm.  Pulmonary:     Effort: Pulmonary effort is normal.     Breath sounds: Normal breath sounds.  Abdominal:      General: Abdomen is flat.     Palpations: Abdomen is soft.  Musculoskeletal:        General: Normal range of motion.  Skin:    General: Skin is warm and dry.  Neurological:     General: No focal deficit present.     Mental Status: He is alert. Mental status is at baseline.  Psychiatric:        Attention and Perception: Attention normal.        Mood and Affect: Mood normal.        Speech: Speech normal.        Behavior: Behavior normal.        Thought Content: Thought content is delusional.        Cognition and Memory: Cognition normal.        Judgment: Judgment normal.    Review of Systems  Constitutional: Negative.   HENT: Negative.    Eyes: Negative.   Respiratory: Negative.    Cardiovascular: Negative.   Gastrointestinal: Negative.   Musculoskeletal: Negative.   Skin: Negative.   Neurological: Negative.   Psychiatric/Behavioral: Negative.     Blood pressure 100/73, pulse 97, temperature 98.2 F (36.8 C), temperature source Oral, resp. rate 18, height 5\' 9"  (1.753 m), weight 96.4 kg, SpO2 98 %. Body mass index is 31.38 kg/m.  Mental Status Per Nursing Assessment::   On Admission:  NA  Demographic Factors:  Male  Loss Factors: NA  Historical Factors: NA  Risk Reduction Factors:   Living with another person, especially a relative, Positive social support, and Positive therapeutic relationship  Continued Clinical Symptoms:  Schizophrenia:   Paranoid or undifferentiated type  Cognitive Features That Contribute To Risk:  None    Suicide Risk:  Minimal: No  identifiable suicidal ideation.  Patients presenting with no risk factors but with morbid ruminations; may be classified as minimal risk based on the severity of the depressive symptoms   Follow-up Information     [facility] Follow up.   Why: Medication management will be continued through psych provider, Dellis Filbert, NP, who provides care to clients in this facility. Contact  information: [address] [place], [place] [address] Phone: [phone]                Plan Of Care/Follow-up recommendations:  Other:  Patient completely denies suicidal ideation and has not displayed any dangerous behavior in the hospital.  He has been consistently compliant with medication and agrees to appropriate outpatient follow-up.  No indication of acute dangerousness at time of discharge.  Mordecai Rasmussen, MD 09/25/2022, 10:23 AM

## 2022-09-25 NOTE — Progress Notes (Signed)
  [facility] Adult Case Management Discharge Plan :  Will you be returning to the same living situation after discharge:  No. At discharge, do you have transportation home?: Yes,  guardian will be providing transportation to ALF. Do you have the ability to pay for your medications: Yes,  [employer].  Release of information consent forms completed and in the chart;  Patient's signature needed at discharge.  Patient to Follow up at:  Follow-up Information     [facility] Follow up.   Why: Medication management will be continued through psych provider, Dellis Filbert, NP, who provides care to clients in this facility. Contact information: [address] [place], [place] [address] Phone: [phone]                Next level of care provider has access to [facility] Link:no  Safety Planning and Suicide Prevention discussed: Yes,  SPE completed with father, Tavarus Madriaga.     Has patient been referred to the Quitline?: Patient refused referral for treatment  Patient has been referred for addiction treatment: No known substance use disorder.  Glenis Smoker, LCSW 09/25/2022, 10:39 AM
